# Patient Record
Sex: Female | Born: 1955
Health system: Southern US, Community
[De-identification: ages and names within clinical notes are randomized; demographics above are authoritative.]

## PROBLEM LIST (undated history)

## (undated) DIAGNOSIS — I739 Peripheral vascular disease, unspecified: Secondary | ICD-10-CM

## (undated) DIAGNOSIS — I10 Essential (primary) hypertension: Secondary | ICD-10-CM

## (undated) DIAGNOSIS — K922 Gastrointestinal hemorrhage, unspecified: Secondary | ICD-10-CM

## (undated) DIAGNOSIS — Z8619 Personal history of other infectious and parasitic diseases: Secondary | ICD-10-CM

## (undated) DIAGNOSIS — K5520 Angiodysplasia of colon without hemorrhage: Secondary | ICD-10-CM

## (undated) DIAGNOSIS — M869 Osteomyelitis, unspecified: Secondary | ICD-10-CM

## (undated) DIAGNOSIS — E669 Obesity, unspecified: Secondary | ICD-10-CM

## (undated) DIAGNOSIS — F32A Depression, unspecified: Secondary | ICD-10-CM

## (undated) DIAGNOSIS — M1990 Unspecified osteoarthritis, unspecified site: Secondary | ICD-10-CM

## (undated) DIAGNOSIS — D649 Anemia, unspecified: Secondary | ICD-10-CM

## (undated) DIAGNOSIS — I700 Atherosclerosis of aorta: Secondary | ICD-10-CM

## (undated) DIAGNOSIS — E119 Type 2 diabetes mellitus without complications: Secondary | ICD-10-CM

## (undated) DIAGNOSIS — K7290 Hepatic failure, unspecified without coma: Secondary | ICD-10-CM

## (undated) DIAGNOSIS — D696 Thrombocytopenia, unspecified: Secondary | ICD-10-CM

## (undated) DIAGNOSIS — F419 Anxiety disorder, unspecified: Secondary | ICD-10-CM

## (undated) DIAGNOSIS — K7460 Unspecified cirrhosis of liver: Secondary | ICD-10-CM

## (undated) DIAGNOSIS — K7682 Hepatic encephalopathy: Secondary | ICD-10-CM

## (undated) DIAGNOSIS — I2510 Atherosclerotic heart disease of native coronary artery without angina pectoris: Secondary | ICD-10-CM

## (undated) DIAGNOSIS — Z87442 Personal history of urinary calculi: Secondary | ICD-10-CM

## (undated) HISTORY — DX: Essential (primary) hypertension: I10

## (undated) HISTORY — DX: Hepatic encephalopathy: K76.82

## (undated) HISTORY — DX: Gastrointestinal hemorrhage, unspecified: K92.2

## (undated) HISTORY — PX: OTHER SURGICAL HISTORY: SHX169

## (undated) HISTORY — PX: TUBAL LIGATION: SHX77

## (undated) HISTORY — DX: Osteomyelitis, unspecified: M86.9

## (undated) HISTORY — PX: ESOPHAGOGASTRODUODENOSCOPY: SHX1529

## (undated) HISTORY — PX: COLONOSCOPY: SHX174

## (undated) HISTORY — DX: Type 2 diabetes mellitus without complications: E11.9

## (undated) HISTORY — DX: Hepatic failure, unspecified without coma: K72.90

## (undated) HISTORY — DX: Atherosclerotic heart disease of native coronary artery without angina pectoris: I25.10

## (undated) HISTORY — DX: Unspecified cirrhosis of liver: K74.60

## (undated) HISTORY — DX: Obesity, unspecified: E66.9

## (undated) HISTORY — DX: Anemia, unspecified: D64.9

## (undated) HISTORY — DX: Angiodysplasia of colon without hemorrhage: K55.20

## (undated) HISTORY — PX: ABDOMINAL HYSTERECTOMY: SHX81

## (undated) HISTORY — DX: Thrombocytopenia, unspecified: D69.6

## (undated) HISTORY — DX: Personal history of other infectious and parasitic diseases: Z86.19

## (undated) HISTORY — DX: Atherosclerosis of aorta: I70.0

---

## 2002-12-19 DIAGNOSIS — K7460 Unspecified cirrhosis of liver: Secondary | ICD-10-CM

## 2002-12-19 HISTORY — DX: Unspecified cirrhosis of liver: K74.60

## 2016-01-11 DIAGNOSIS — A5216 Charcot's arthropathy (tabetic): Secondary | ICD-10-CM | POA: Diagnosis not present

## 2016-01-11 DIAGNOSIS — M79672 Pain in left foot: Secondary | ICD-10-CM | POA: Diagnosis not present

## 2016-01-11 DIAGNOSIS — S92014A Nondisplaced fracture of body of right calcaneus, initial encounter for closed fracture: Secondary | ICD-10-CM | POA: Diagnosis not present

## 2016-01-11 DIAGNOSIS — M79671 Pain in right foot: Secondary | ICD-10-CM | POA: Diagnosis not present

## 2016-01-18 DIAGNOSIS — E1151 Type 2 diabetes mellitus with diabetic peripheral angiopathy without gangrene: Secondary | ICD-10-CM | POA: Diagnosis not present

## 2016-01-18 DIAGNOSIS — E1140 Type 2 diabetes mellitus with diabetic neuropathy, unspecified: Secondary | ICD-10-CM | POA: Diagnosis not present

## 2016-02-29 DIAGNOSIS — S92014D Nondisplaced fracture of body of right calcaneus, subsequent encounter for fracture with routine healing: Secondary | ICD-10-CM | POA: Diagnosis not present

## 2016-02-29 DIAGNOSIS — E1151 Type 2 diabetes mellitus with diabetic peripheral angiopathy without gangrene: Secondary | ICD-10-CM | POA: Diagnosis not present

## 2016-02-29 DIAGNOSIS — M79671 Pain in right foot: Secondary | ICD-10-CM | POA: Diagnosis not present

## 2016-02-29 DIAGNOSIS — M79672 Pain in left foot: Secondary | ICD-10-CM | POA: Diagnosis not present

## 2016-02-29 DIAGNOSIS — A5216 Charcot's arthropathy (tabetic): Secondary | ICD-10-CM | POA: Diagnosis not present

## 2016-02-29 DIAGNOSIS — E1140 Type 2 diabetes mellitus with diabetic neuropathy, unspecified: Secondary | ICD-10-CM | POA: Diagnosis not present

## 2016-03-09 DIAGNOSIS — Z7189 Other specified counseling: Secondary | ICD-10-CM | POA: Diagnosis not present

## 2016-03-09 DIAGNOSIS — R5383 Other fatigue: Secondary | ICD-10-CM | POA: Diagnosis not present

## 2016-03-09 DIAGNOSIS — Z299 Encounter for prophylactic measures, unspecified: Secondary | ICD-10-CM | POA: Diagnosis not present

## 2016-03-09 DIAGNOSIS — Z6833 Body mass index (BMI) 33.0-33.9, adult: Secondary | ICD-10-CM | POA: Diagnosis not present

## 2016-03-09 DIAGNOSIS — Z1211 Encounter for screening for malignant neoplasm of colon: Secondary | ICD-10-CM | POA: Diagnosis not present

## 2016-03-09 DIAGNOSIS — E1142 Type 2 diabetes mellitus with diabetic polyneuropathy: Secondary | ICD-10-CM | POA: Diagnosis not present

## 2016-03-09 DIAGNOSIS — E7800 Pure hypercholesterolemia, unspecified: Secondary | ICD-10-CM | POA: Diagnosis not present

## 2016-03-09 DIAGNOSIS — E559 Vitamin D deficiency, unspecified: Secondary | ICD-10-CM | POA: Diagnosis not present

## 2016-03-09 DIAGNOSIS — Z1389 Encounter for screening for other disorder: Secondary | ICD-10-CM | POA: Diagnosis not present

## 2016-03-09 DIAGNOSIS — Z0000 Encounter for general adult medical examination without abnormal findings: Secondary | ICD-10-CM | POA: Diagnosis not present

## 2016-04-04 DIAGNOSIS — M7662 Achilles tendinitis, left leg: Secondary | ICD-10-CM | POA: Diagnosis not present

## 2016-04-04 DIAGNOSIS — M79672 Pain in left foot: Secondary | ICD-10-CM | POA: Diagnosis not present

## 2016-04-14 DIAGNOSIS — E7800 Pure hypercholesterolemia, unspecified: Secondary | ICD-10-CM | POA: Diagnosis not present

## 2016-04-14 DIAGNOSIS — E2839 Other primary ovarian failure: Secondary | ICD-10-CM | POA: Diagnosis not present

## 2016-04-14 DIAGNOSIS — Z87891 Personal history of nicotine dependence: Secondary | ICD-10-CM | POA: Diagnosis not present

## 2016-04-14 DIAGNOSIS — R740 Nonspecific elevation of levels of transaminase and lactic acid dehydrogenase [LDH]: Secondary | ICD-10-CM | POA: Diagnosis not present

## 2016-04-14 DIAGNOSIS — E1142 Type 2 diabetes mellitus with diabetic polyneuropathy: Secondary | ICD-10-CM | POA: Diagnosis not present

## 2016-05-03 DIAGNOSIS — E1142 Type 2 diabetes mellitus with diabetic polyneuropathy: Secondary | ICD-10-CM | POA: Diagnosis not present

## 2016-05-03 DIAGNOSIS — I10 Essential (primary) hypertension: Secondary | ICD-10-CM | POA: Diagnosis not present

## 2016-05-03 DIAGNOSIS — L97921 Non-pressure chronic ulcer of unspecified part of left lower leg limited to breakdown of skin: Secondary | ICD-10-CM | POA: Diagnosis not present

## 2016-05-23 DIAGNOSIS — L89892 Pressure ulcer of other site, stage 2: Secondary | ICD-10-CM | POA: Diagnosis not present

## 2016-05-23 DIAGNOSIS — E1151 Type 2 diabetes mellitus with diabetic peripheral angiopathy without gangrene: Secondary | ICD-10-CM | POA: Diagnosis not present

## 2016-05-23 DIAGNOSIS — E1140 Type 2 diabetes mellitus with diabetic neuropathy, unspecified: Secondary | ICD-10-CM | POA: Diagnosis not present

## 2016-05-30 DIAGNOSIS — E1140 Type 2 diabetes mellitus with diabetic neuropathy, unspecified: Secondary | ICD-10-CM | POA: Diagnosis not present

## 2016-05-30 DIAGNOSIS — L89892 Pressure ulcer of other site, stage 2: Secondary | ICD-10-CM | POA: Diagnosis not present

## 2016-05-30 DIAGNOSIS — E1151 Type 2 diabetes mellitus with diabetic peripheral angiopathy without gangrene: Secondary | ICD-10-CM | POA: Diagnosis not present

## 2016-06-13 DIAGNOSIS — I10 Essential (primary) hypertension: Secondary | ICD-10-CM | POA: Diagnosis not present

## 2016-06-13 DIAGNOSIS — E119 Type 2 diabetes mellitus without complications: Secondary | ICD-10-CM | POA: Diagnosis not present

## 2016-06-13 DIAGNOSIS — E7800 Pure hypercholesterolemia, unspecified: Secondary | ICD-10-CM | POA: Diagnosis not present

## 2016-07-19 DIAGNOSIS — M7531 Calcific tendinitis of right shoulder: Secondary | ICD-10-CM | POA: Diagnosis not present

## 2016-07-19 DIAGNOSIS — Z79899 Other long term (current) drug therapy: Secondary | ICD-10-CM | POA: Diagnosis not present

## 2016-07-19 DIAGNOSIS — M7989 Other specified soft tissue disorders: Secondary | ICD-10-CM | POA: Diagnosis not present

## 2016-07-19 DIAGNOSIS — L0889 Other specified local infections of the skin and subcutaneous tissue: Secondary | ICD-10-CM | POA: Diagnosis not present

## 2016-07-19 DIAGNOSIS — L97511 Non-pressure chronic ulcer of other part of right foot limited to breakdown of skin: Secondary | ICD-10-CM | POA: Diagnosis not present

## 2016-07-19 DIAGNOSIS — I10 Essential (primary) hypertension: Secondary | ICD-10-CM | POA: Diagnosis not present

## 2016-07-19 DIAGNOSIS — L089 Local infection of the skin and subcutaneous tissue, unspecified: Secondary | ICD-10-CM | POA: Diagnosis not present

## 2016-07-19 DIAGNOSIS — Z794 Long term (current) use of insulin: Secondary | ICD-10-CM | POA: Diagnosis not present

## 2016-07-19 DIAGNOSIS — Z7984 Long term (current) use of oral hypoglycemic drugs: Secondary | ICD-10-CM | POA: Diagnosis not present

## 2016-07-19 DIAGNOSIS — F329 Major depressive disorder, single episode, unspecified: Secondary | ICD-10-CM | POA: Diagnosis not present

## 2016-07-19 DIAGNOSIS — E11621 Type 2 diabetes mellitus with foot ulcer: Secondary | ICD-10-CM | POA: Diagnosis not present

## 2016-07-19 DIAGNOSIS — M25511 Pain in right shoulder: Secondary | ICD-10-CM | POA: Diagnosis not present

## 2016-07-26 DIAGNOSIS — I10 Essential (primary) hypertension: Secondary | ICD-10-CM | POA: Diagnosis not present

## 2016-07-26 DIAGNOSIS — E119 Type 2 diabetes mellitus without complications: Secondary | ICD-10-CM | POA: Diagnosis not present

## 2016-07-26 DIAGNOSIS — E7800 Pure hypercholesterolemia, unspecified: Secondary | ICD-10-CM | POA: Diagnosis not present

## 2016-08-19 DIAGNOSIS — R42 Dizziness and giddiness: Secondary | ICD-10-CM | POA: Diagnosis not present

## 2016-08-19 DIAGNOSIS — L97519 Non-pressure chronic ulcer of other part of right foot with unspecified severity: Secondary | ICD-10-CM | POA: Diagnosis not present

## 2016-08-19 DIAGNOSIS — E1142 Type 2 diabetes mellitus with diabetic polyneuropathy: Secondary | ICD-10-CM | POA: Diagnosis not present

## 2016-08-19 DIAGNOSIS — J209 Acute bronchitis, unspecified: Secondary | ICD-10-CM | POA: Diagnosis not present

## 2016-08-29 DIAGNOSIS — L97901 Non-pressure chronic ulcer of unspecified part of unspecified lower leg limited to breakdown of skin: Secondary | ICD-10-CM | POA: Diagnosis not present

## 2016-08-29 DIAGNOSIS — L97911 Non-pressure chronic ulcer of unspecified part of right lower leg limited to breakdown of skin: Secondary | ICD-10-CM | POA: Diagnosis not present

## 2016-09-08 DIAGNOSIS — E119 Type 2 diabetes mellitus without complications: Secondary | ICD-10-CM | POA: Diagnosis not present

## 2016-09-08 DIAGNOSIS — H2513 Age-related nuclear cataract, bilateral: Secondary | ICD-10-CM | POA: Diagnosis not present

## 2016-09-08 DIAGNOSIS — H5200 Hypermetropia, unspecified eye: Secondary | ICD-10-CM | POA: Diagnosis not present

## 2016-09-08 DIAGNOSIS — H1045 Other chronic allergic conjunctivitis: Secondary | ICD-10-CM | POA: Diagnosis not present

## 2016-09-09 DIAGNOSIS — I739 Peripheral vascular disease, unspecified: Secondary | ICD-10-CM | POA: Diagnosis not present

## 2016-09-09 DIAGNOSIS — Z6831 Body mass index (BMI) 31.0-31.9, adult: Secondary | ICD-10-CM | POA: Diagnosis not present

## 2016-09-09 DIAGNOSIS — L97921 Non-pressure chronic ulcer of unspecified part of left lower leg limited to breakdown of skin: Secondary | ICD-10-CM | POA: Diagnosis not present

## 2016-09-09 DIAGNOSIS — M549 Dorsalgia, unspecified: Secondary | ICD-10-CM | POA: Diagnosis not present

## 2016-09-13 DIAGNOSIS — E7800 Pure hypercholesterolemia, unspecified: Secondary | ICD-10-CM | POA: Diagnosis not present

## 2016-09-13 DIAGNOSIS — I10 Essential (primary) hypertension: Secondary | ICD-10-CM | POA: Diagnosis not present

## 2016-09-13 DIAGNOSIS — E119 Type 2 diabetes mellitus without complications: Secondary | ICD-10-CM | POA: Diagnosis not present

## 2016-09-19 DIAGNOSIS — E1151 Type 2 diabetes mellitus with diabetic peripheral angiopathy without gangrene: Secondary | ICD-10-CM | POA: Diagnosis not present

## 2016-09-19 DIAGNOSIS — E1140 Type 2 diabetes mellitus with diabetic neuropathy, unspecified: Secondary | ICD-10-CM | POA: Diagnosis not present

## 2016-09-19 DIAGNOSIS — L89892 Pressure ulcer of other site, stage 2: Secondary | ICD-10-CM | POA: Diagnosis not present

## 2016-10-03 DIAGNOSIS — L89892 Pressure ulcer of other site, stage 2: Secondary | ICD-10-CM | POA: Diagnosis not present

## 2016-10-03 DIAGNOSIS — M79671 Pain in right foot: Secondary | ICD-10-CM | POA: Diagnosis not present

## 2016-10-03 DIAGNOSIS — E1140 Type 2 diabetes mellitus with diabetic neuropathy, unspecified: Secondary | ICD-10-CM | POA: Diagnosis not present

## 2016-10-03 DIAGNOSIS — E1151 Type 2 diabetes mellitus with diabetic peripheral angiopathy without gangrene: Secondary | ICD-10-CM | POA: Diagnosis not present

## 2016-10-17 DIAGNOSIS — E7800 Pure hypercholesterolemia, unspecified: Secondary | ICD-10-CM | POA: Diagnosis not present

## 2016-10-17 DIAGNOSIS — E119 Type 2 diabetes mellitus without complications: Secondary | ICD-10-CM | POA: Diagnosis not present

## 2016-10-17 DIAGNOSIS — I10 Essential (primary) hypertension: Secondary | ICD-10-CM | POA: Diagnosis not present

## 2016-10-18 DIAGNOSIS — E1140 Type 2 diabetes mellitus with diabetic neuropathy, unspecified: Secondary | ICD-10-CM | POA: Diagnosis not present

## 2016-10-18 DIAGNOSIS — E1151 Type 2 diabetes mellitus with diabetic peripheral angiopathy without gangrene: Secondary | ICD-10-CM | POA: Diagnosis not present

## 2016-10-19 DIAGNOSIS — Z299 Encounter for prophylactic measures, unspecified: Secondary | ICD-10-CM | POA: Diagnosis not present

## 2016-10-19 DIAGNOSIS — I10 Essential (primary) hypertension: Secondary | ICD-10-CM | POA: Diagnosis not present

## 2016-10-19 DIAGNOSIS — F329 Major depressive disorder, single episode, unspecified: Secondary | ICD-10-CM | POA: Diagnosis not present

## 2016-10-19 DIAGNOSIS — Z1389 Encounter for screening for other disorder: Secondary | ICD-10-CM | POA: Diagnosis not present

## 2016-10-19 DIAGNOSIS — M7551 Bursitis of right shoulder: Secondary | ICD-10-CM | POA: Diagnosis not present

## 2016-10-19 DIAGNOSIS — E1142 Type 2 diabetes mellitus with diabetic polyneuropathy: Secondary | ICD-10-CM | POA: Diagnosis not present

## 2016-10-24 DIAGNOSIS — Z23 Encounter for immunization: Secondary | ICD-10-CM | POA: Diagnosis not present

## 2016-11-08 DIAGNOSIS — E1140 Type 2 diabetes mellitus with diabetic neuropathy, unspecified: Secondary | ICD-10-CM | POA: Diagnosis not present

## 2016-11-08 DIAGNOSIS — E1151 Type 2 diabetes mellitus with diabetic peripheral angiopathy without gangrene: Secondary | ICD-10-CM | POA: Diagnosis not present

## 2016-12-06 DIAGNOSIS — L89892 Pressure ulcer of other site, stage 2: Secondary | ICD-10-CM | POA: Diagnosis not present

## 2016-12-06 DIAGNOSIS — E1142 Type 2 diabetes mellitus with diabetic polyneuropathy: Secondary | ICD-10-CM | POA: Diagnosis not present

## 2016-12-06 DIAGNOSIS — M79609 Pain in unspecified limb: Secondary | ICD-10-CM | POA: Diagnosis not present

## 2016-12-06 DIAGNOSIS — Z299 Encounter for prophylactic measures, unspecified: Secondary | ICD-10-CM | POA: Diagnosis not present

## 2016-12-06 DIAGNOSIS — J069 Acute upper respiratory infection, unspecified: Secondary | ICD-10-CM | POA: Diagnosis not present

## 2016-12-06 DIAGNOSIS — E1140 Type 2 diabetes mellitus with diabetic neuropathy, unspecified: Secondary | ICD-10-CM | POA: Diagnosis not present

## 2016-12-06 DIAGNOSIS — M19019 Primary osteoarthritis, unspecified shoulder: Secondary | ICD-10-CM | POA: Diagnosis not present

## 2016-12-06 DIAGNOSIS — Z6832 Body mass index (BMI) 32.0-32.9, adult: Secondary | ICD-10-CM | POA: Diagnosis not present

## 2016-12-06 DIAGNOSIS — E1151 Type 2 diabetes mellitus with diabetic peripheral angiopathy without gangrene: Secondary | ICD-10-CM | POA: Diagnosis not present

## 2016-12-06 DIAGNOSIS — Z87891 Personal history of nicotine dependence: Secondary | ICD-10-CM | POA: Diagnosis not present

## 2016-12-15 DIAGNOSIS — E119 Type 2 diabetes mellitus without complications: Secondary | ICD-10-CM | POA: Diagnosis not present

## 2016-12-15 DIAGNOSIS — I10 Essential (primary) hypertension: Secondary | ICD-10-CM | POA: Diagnosis not present

## 2016-12-15 DIAGNOSIS — E7800 Pure hypercholesterolemia, unspecified: Secondary | ICD-10-CM | POA: Diagnosis not present

## 2017-01-02 DIAGNOSIS — L89892 Pressure ulcer of other site, stage 2: Secondary | ICD-10-CM | POA: Diagnosis not present

## 2017-01-02 DIAGNOSIS — E1140 Type 2 diabetes mellitus with diabetic neuropathy, unspecified: Secondary | ICD-10-CM | POA: Diagnosis not present

## 2017-01-02 DIAGNOSIS — E1151 Type 2 diabetes mellitus with diabetic peripheral angiopathy without gangrene: Secondary | ICD-10-CM | POA: Diagnosis not present

## 2017-01-17 DIAGNOSIS — Z6832 Body mass index (BMI) 32.0-32.9, adult: Secondary | ICD-10-CM | POA: Diagnosis not present

## 2017-01-17 DIAGNOSIS — E1142 Type 2 diabetes mellitus with diabetic polyneuropathy: Secondary | ICD-10-CM | POA: Diagnosis not present

## 2017-01-17 DIAGNOSIS — R0789 Other chest pain: Secondary | ICD-10-CM | POA: Diagnosis not present

## 2017-01-17 DIAGNOSIS — Z87891 Personal history of nicotine dependence: Secondary | ICD-10-CM | POA: Diagnosis not present

## 2017-01-17 DIAGNOSIS — R350 Frequency of micturition: Secondary | ICD-10-CM | POA: Diagnosis not present

## 2017-01-17 DIAGNOSIS — Z713 Dietary counseling and surveillance: Secondary | ICD-10-CM | POA: Diagnosis not present

## 2017-01-17 DIAGNOSIS — Z299 Encounter for prophylactic measures, unspecified: Secondary | ICD-10-CM | POA: Diagnosis not present

## 2017-01-30 DIAGNOSIS — E1140 Type 2 diabetes mellitus with diabetic neuropathy, unspecified: Secondary | ICD-10-CM | POA: Diagnosis not present

## 2017-01-30 DIAGNOSIS — E1151 Type 2 diabetes mellitus with diabetic peripheral angiopathy without gangrene: Secondary | ICD-10-CM | POA: Diagnosis not present

## 2017-01-30 DIAGNOSIS — L89892 Pressure ulcer of other site, stage 2: Secondary | ICD-10-CM | POA: Diagnosis not present

## 2017-02-09 DIAGNOSIS — Z794 Long term (current) use of insulin: Secondary | ICD-10-CM | POA: Diagnosis not present

## 2017-02-09 DIAGNOSIS — E119 Type 2 diabetes mellitus without complications: Secondary | ICD-10-CM | POA: Diagnosis not present

## 2017-02-09 DIAGNOSIS — M25532 Pain in left wrist: Secondary | ICD-10-CM | POA: Diagnosis not present

## 2017-02-09 DIAGNOSIS — G5602 Carpal tunnel syndrome, left upper limb: Secondary | ICD-10-CM | POA: Diagnosis not present

## 2017-02-09 DIAGNOSIS — Z87891 Personal history of nicotine dependence: Secondary | ICD-10-CM | POA: Diagnosis not present

## 2017-02-09 DIAGNOSIS — M7989 Other specified soft tissue disorders: Secondary | ICD-10-CM | POA: Diagnosis not present

## 2017-02-09 DIAGNOSIS — M1990 Unspecified osteoarthritis, unspecified site: Secondary | ICD-10-CM | POA: Diagnosis not present

## 2017-02-09 DIAGNOSIS — Z79899 Other long term (current) drug therapy: Secondary | ICD-10-CM | POA: Diagnosis not present

## 2017-02-09 DIAGNOSIS — Z8249 Family history of ischemic heart disease and other diseases of the circulatory system: Secondary | ICD-10-CM | POA: Diagnosis not present

## 2017-02-09 DIAGNOSIS — F329 Major depressive disorder, single episode, unspecified: Secondary | ICD-10-CM | POA: Diagnosis not present

## 2017-02-09 DIAGNOSIS — I10 Essential (primary) hypertension: Secondary | ICD-10-CM | POA: Diagnosis not present

## 2017-02-09 DIAGNOSIS — S6992XA Unspecified injury of left wrist, hand and finger(s), initial encounter: Secondary | ICD-10-CM | POA: Diagnosis not present

## 2017-02-10 DIAGNOSIS — I10 Essential (primary) hypertension: Secondary | ICD-10-CM | POA: Diagnosis not present

## 2017-02-10 DIAGNOSIS — E7800 Pure hypercholesterolemia, unspecified: Secondary | ICD-10-CM | POA: Diagnosis not present

## 2017-02-10 DIAGNOSIS — E119 Type 2 diabetes mellitus without complications: Secondary | ICD-10-CM | POA: Diagnosis not present

## 2017-03-13 DIAGNOSIS — E1140 Type 2 diabetes mellitus with diabetic neuropathy, unspecified: Secondary | ICD-10-CM | POA: Diagnosis not present

## 2017-03-13 DIAGNOSIS — L89892 Pressure ulcer of other site, stage 2: Secondary | ICD-10-CM | POA: Diagnosis not present

## 2017-03-13 DIAGNOSIS — E1151 Type 2 diabetes mellitus with diabetic peripheral angiopathy without gangrene: Secondary | ICD-10-CM | POA: Diagnosis not present

## 2017-03-14 DIAGNOSIS — Z299 Encounter for prophylactic measures, unspecified: Secondary | ICD-10-CM | POA: Diagnosis not present

## 2017-03-14 DIAGNOSIS — E7800 Pure hypercholesterolemia, unspecified: Secondary | ICD-10-CM | POA: Diagnosis not present

## 2017-03-14 DIAGNOSIS — Z79899 Other long term (current) drug therapy: Secondary | ICD-10-CM | POA: Diagnosis not present

## 2017-03-14 DIAGNOSIS — L97921 Non-pressure chronic ulcer of unspecified part of left lower leg limited to breakdown of skin: Secondary | ICD-10-CM | POA: Diagnosis not present

## 2017-03-14 DIAGNOSIS — Z1211 Encounter for screening for malignant neoplasm of colon: Secondary | ICD-10-CM | POA: Diagnosis not present

## 2017-03-14 DIAGNOSIS — E1142 Type 2 diabetes mellitus with diabetic polyneuropathy: Secondary | ICD-10-CM | POA: Diagnosis not present

## 2017-03-14 DIAGNOSIS — K769 Liver disease, unspecified: Secondary | ICD-10-CM | POA: Diagnosis not present

## 2017-03-14 DIAGNOSIS — R5383 Other fatigue: Secondary | ICD-10-CM | POA: Diagnosis not present

## 2017-03-14 DIAGNOSIS — Z0000 Encounter for general adult medical examination without abnormal findings: Secondary | ICD-10-CM | POA: Diagnosis not present

## 2017-03-14 DIAGNOSIS — K7460 Unspecified cirrhosis of liver: Secondary | ICD-10-CM | POA: Diagnosis not present

## 2017-03-14 DIAGNOSIS — E559 Vitamin D deficiency, unspecified: Secondary | ICD-10-CM | POA: Diagnosis not present

## 2017-03-14 DIAGNOSIS — I739 Peripheral vascular disease, unspecified: Secondary | ICD-10-CM | POA: Diagnosis not present

## 2017-03-14 DIAGNOSIS — Z7189 Other specified counseling: Secondary | ICD-10-CM | POA: Diagnosis not present

## 2017-03-14 DIAGNOSIS — Z6833 Body mass index (BMI) 33.0-33.9, adult: Secondary | ICD-10-CM | POA: Diagnosis not present

## 2017-03-14 DIAGNOSIS — Z1389 Encounter for screening for other disorder: Secondary | ICD-10-CM | POA: Diagnosis not present

## 2017-03-29 DIAGNOSIS — M25519 Pain in unspecified shoulder: Secondary | ICD-10-CM | POA: Diagnosis not present

## 2017-03-29 DIAGNOSIS — Z1389 Encounter for screening for other disorder: Secondary | ICD-10-CM | POA: Diagnosis not present

## 2017-03-29 DIAGNOSIS — E1149 Type 2 diabetes mellitus with other diabetic neurological complication: Secondary | ICD-10-CM | POA: Diagnosis not present

## 2017-03-29 DIAGNOSIS — Z6833 Body mass index (BMI) 33.0-33.9, adult: Secondary | ICD-10-CM | POA: Diagnosis not present

## 2017-03-29 DIAGNOSIS — Z299 Encounter for prophylactic measures, unspecified: Secondary | ICD-10-CM | POA: Diagnosis not present

## 2017-03-29 DIAGNOSIS — I10 Essential (primary) hypertension: Secondary | ICD-10-CM | POA: Diagnosis not present

## 2017-03-29 DIAGNOSIS — F329 Major depressive disorder, single episode, unspecified: Secondary | ICD-10-CM | POA: Diagnosis not present

## 2017-03-29 DIAGNOSIS — E1165 Type 2 diabetes mellitus with hyperglycemia: Secondary | ICD-10-CM | POA: Diagnosis not present

## 2017-04-03 DIAGNOSIS — E1151 Type 2 diabetes mellitus with diabetic peripheral angiopathy without gangrene: Secondary | ICD-10-CM | POA: Diagnosis not present

## 2017-04-03 DIAGNOSIS — L89892 Pressure ulcer of other site, stage 2: Secondary | ICD-10-CM | POA: Diagnosis not present

## 2017-04-03 DIAGNOSIS — E1140 Type 2 diabetes mellitus with diabetic neuropathy, unspecified: Secondary | ICD-10-CM | POA: Diagnosis not present

## 2017-04-06 DIAGNOSIS — E119 Type 2 diabetes mellitus without complications: Secondary | ICD-10-CM | POA: Diagnosis not present

## 2017-04-06 DIAGNOSIS — I10 Essential (primary) hypertension: Secondary | ICD-10-CM | POA: Diagnosis not present

## 2017-04-06 DIAGNOSIS — E7800 Pure hypercholesterolemia, unspecified: Secondary | ICD-10-CM | POA: Diagnosis not present

## 2017-04-10 DIAGNOSIS — M797 Fibromyalgia: Secondary | ICD-10-CM | POA: Diagnosis not present

## 2017-04-10 DIAGNOSIS — M2560 Stiffness of unspecified joint, not elsewhere classified: Secondary | ICD-10-CM | POA: Diagnosis not present

## 2017-04-10 DIAGNOSIS — M25512 Pain in left shoulder: Secondary | ICD-10-CM | POA: Diagnosis not present

## 2017-04-10 DIAGNOSIS — M19019 Primary osteoarthritis, unspecified shoulder: Secondary | ICD-10-CM | POA: Diagnosis not present

## 2017-04-10 DIAGNOSIS — M62838 Other muscle spasm: Secondary | ICD-10-CM | POA: Diagnosis not present

## 2017-04-10 DIAGNOSIS — M25511 Pain in right shoulder: Secondary | ICD-10-CM | POA: Diagnosis not present

## 2017-04-13 DIAGNOSIS — M797 Fibromyalgia: Secondary | ICD-10-CM | POA: Diagnosis not present

## 2017-04-13 DIAGNOSIS — M19019 Primary osteoarthritis, unspecified shoulder: Secondary | ICD-10-CM | POA: Diagnosis not present

## 2017-04-13 DIAGNOSIS — M2560 Stiffness of unspecified joint, not elsewhere classified: Secondary | ICD-10-CM | POA: Diagnosis not present

## 2017-04-13 DIAGNOSIS — M25512 Pain in left shoulder: Secondary | ICD-10-CM | POA: Diagnosis not present

## 2017-04-13 DIAGNOSIS — M25511 Pain in right shoulder: Secondary | ICD-10-CM | POA: Diagnosis not present

## 2017-04-13 DIAGNOSIS — M62838 Other muscle spasm: Secondary | ICD-10-CM | POA: Diagnosis not present

## 2017-04-17 DIAGNOSIS — M25511 Pain in right shoulder: Secondary | ICD-10-CM | POA: Diagnosis not present

## 2017-04-17 DIAGNOSIS — M25512 Pain in left shoulder: Secondary | ICD-10-CM | POA: Diagnosis not present

## 2017-04-17 DIAGNOSIS — M2560 Stiffness of unspecified joint, not elsewhere classified: Secondary | ICD-10-CM | POA: Diagnosis not present

## 2017-04-17 DIAGNOSIS — M19019 Primary osteoarthritis, unspecified shoulder: Secondary | ICD-10-CM | POA: Diagnosis not present

## 2017-04-17 DIAGNOSIS — M797 Fibromyalgia: Secondary | ICD-10-CM | POA: Diagnosis not present

## 2017-04-17 DIAGNOSIS — M62838 Other muscle spasm: Secondary | ICD-10-CM | POA: Diagnosis not present

## 2017-04-24 DIAGNOSIS — Z79899 Other long term (current) drug therapy: Secondary | ICD-10-CM | POA: Diagnosis not present

## 2017-04-24 DIAGNOSIS — E119 Type 2 diabetes mellitus without complications: Secondary | ICD-10-CM | POA: Diagnosis not present

## 2017-04-24 DIAGNOSIS — I10 Essential (primary) hypertension: Secondary | ICD-10-CM | POA: Diagnosis not present

## 2017-04-24 DIAGNOSIS — M86071 Acute hematogenous osteomyelitis, right ankle and foot: Secondary | ICD-10-CM | POA: Diagnosis not present

## 2017-04-24 DIAGNOSIS — F329 Major depressive disorder, single episode, unspecified: Secondary | ICD-10-CM | POA: Diagnosis not present

## 2017-04-24 DIAGNOSIS — Z794 Long term (current) use of insulin: Secondary | ICD-10-CM | POA: Diagnosis not present

## 2017-04-24 DIAGNOSIS — L03031 Cellulitis of right toe: Secondary | ICD-10-CM | POA: Diagnosis not present

## 2017-04-24 DIAGNOSIS — M79671 Pain in right foot: Secondary | ICD-10-CM | POA: Diagnosis not present

## 2017-04-24 DIAGNOSIS — M1990 Unspecified osteoarthritis, unspecified site: Secondary | ICD-10-CM | POA: Diagnosis not present

## 2017-04-24 DIAGNOSIS — M7989 Other specified soft tissue disorders: Secondary | ICD-10-CM | POA: Diagnosis not present

## 2017-04-26 DIAGNOSIS — E1140 Type 2 diabetes mellitus with diabetic neuropathy, unspecified: Secondary | ICD-10-CM | POA: Diagnosis not present

## 2017-04-26 DIAGNOSIS — E1151 Type 2 diabetes mellitus with diabetic peripheral angiopathy without gangrene: Secondary | ICD-10-CM | POA: Diagnosis not present

## 2017-04-26 DIAGNOSIS — L89892 Pressure ulcer of other site, stage 2: Secondary | ICD-10-CM | POA: Diagnosis not present

## 2017-05-01 DIAGNOSIS — E1140 Type 2 diabetes mellitus with diabetic neuropathy, unspecified: Secondary | ICD-10-CM | POA: Diagnosis not present

## 2017-05-01 DIAGNOSIS — L89892 Pressure ulcer of other site, stage 2: Secondary | ICD-10-CM | POA: Diagnosis not present

## 2017-05-01 DIAGNOSIS — E1151 Type 2 diabetes mellitus with diabetic peripheral angiopathy without gangrene: Secondary | ICD-10-CM | POA: Diagnosis not present

## 2017-05-02 DIAGNOSIS — L97519 Non-pressure chronic ulcer of other part of right foot with unspecified severity: Secondary | ICD-10-CM | POA: Diagnosis not present

## 2017-05-02 DIAGNOSIS — L03115 Cellulitis of right lower limb: Secondary | ICD-10-CM | POA: Diagnosis not present

## 2017-05-02 DIAGNOSIS — E11621 Type 2 diabetes mellitus with foot ulcer: Secondary | ICD-10-CM | POA: Diagnosis not present

## 2017-05-08 DIAGNOSIS — E1151 Type 2 diabetes mellitus with diabetic peripheral angiopathy without gangrene: Secondary | ICD-10-CM | POA: Diagnosis not present

## 2017-05-08 DIAGNOSIS — E1140 Type 2 diabetes mellitus with diabetic neuropathy, unspecified: Secondary | ICD-10-CM | POA: Diagnosis not present

## 2017-05-08 DIAGNOSIS — L89892 Pressure ulcer of other site, stage 2: Secondary | ICD-10-CM | POA: Diagnosis not present

## 2017-05-19 DIAGNOSIS — I10 Essential (primary) hypertension: Secondary | ICD-10-CM | POA: Diagnosis not present

## 2017-05-19 DIAGNOSIS — E119 Type 2 diabetes mellitus without complications: Secondary | ICD-10-CM | POA: Diagnosis not present

## 2017-05-19 DIAGNOSIS — E7800 Pure hypercholesterolemia, unspecified: Secondary | ICD-10-CM | POA: Diagnosis not present

## 2017-05-22 DIAGNOSIS — L89892 Pressure ulcer of other site, stage 2: Secondary | ICD-10-CM | POA: Diagnosis not present

## 2017-05-22 DIAGNOSIS — E1151 Type 2 diabetes mellitus with diabetic peripheral angiopathy without gangrene: Secondary | ICD-10-CM | POA: Diagnosis not present

## 2017-05-22 DIAGNOSIS — M86171 Other acute osteomyelitis, right ankle and foot: Secondary | ICD-10-CM | POA: Diagnosis not present

## 2017-05-22 DIAGNOSIS — E1140 Type 2 diabetes mellitus with diabetic neuropathy, unspecified: Secondary | ICD-10-CM | POA: Diagnosis not present

## 2017-05-23 DIAGNOSIS — Z87442 Personal history of urinary calculi: Secondary | ICD-10-CM | POA: Diagnosis not present

## 2017-05-23 DIAGNOSIS — F329 Major depressive disorder, single episode, unspecified: Secondary | ICD-10-CM | POA: Diagnosis not present

## 2017-05-23 DIAGNOSIS — Z90710 Acquired absence of both cervix and uterus: Secondary | ICD-10-CM | POA: Diagnosis not present

## 2017-05-23 DIAGNOSIS — Z01818 Encounter for other preprocedural examination: Secondary | ICD-10-CM | POA: Diagnosis not present

## 2017-05-23 DIAGNOSIS — I10 Essential (primary) hypertension: Secondary | ICD-10-CM | POA: Diagnosis not present

## 2017-05-23 DIAGNOSIS — Z8249 Family history of ischemic heart disease and other diseases of the circulatory system: Secondary | ICD-10-CM | POA: Diagnosis not present

## 2017-05-23 DIAGNOSIS — E1140 Type 2 diabetes mellitus with diabetic neuropathy, unspecified: Secondary | ICD-10-CM | POA: Diagnosis not present

## 2017-05-23 DIAGNOSIS — K7460 Unspecified cirrhosis of liver: Secondary | ICD-10-CM | POA: Diagnosis not present

## 2017-05-23 DIAGNOSIS — Z794 Long term (current) use of insulin: Secondary | ICD-10-CM | POA: Diagnosis not present

## 2017-05-23 DIAGNOSIS — M1990 Unspecified osteoarthritis, unspecified site: Secondary | ICD-10-CM | POA: Diagnosis not present

## 2017-05-23 DIAGNOSIS — M79671 Pain in right foot: Secondary | ICD-10-CM | POA: Diagnosis not present

## 2017-05-23 DIAGNOSIS — Z841 Family history of disorders of kidney and ureter: Secondary | ICD-10-CM | POA: Diagnosis not present

## 2017-05-23 DIAGNOSIS — E7800 Pure hypercholesterolemia, unspecified: Secondary | ICD-10-CM | POA: Diagnosis not present

## 2017-05-23 DIAGNOSIS — E11621 Type 2 diabetes mellitus with foot ulcer: Secondary | ICD-10-CM | POA: Diagnosis not present

## 2017-05-23 DIAGNOSIS — M869 Osteomyelitis, unspecified: Secondary | ICD-10-CM | POA: Diagnosis not present

## 2017-05-23 DIAGNOSIS — Z79899 Other long term (current) drug therapy: Secondary | ICD-10-CM | POA: Diagnosis not present

## 2017-05-23 DIAGNOSIS — J302 Other seasonal allergic rhinitis: Secondary | ICD-10-CM | POA: Diagnosis not present

## 2017-05-23 DIAGNOSIS — Z9851 Tubal ligation status: Secondary | ICD-10-CM | POA: Diagnosis not present

## 2017-05-23 DIAGNOSIS — E1169 Type 2 diabetes mellitus with other specified complication: Secondary | ICD-10-CM | POA: Diagnosis not present

## 2017-05-23 DIAGNOSIS — Z7982 Long term (current) use of aspirin: Secondary | ICD-10-CM | POA: Diagnosis not present

## 2017-05-23 DIAGNOSIS — Z833 Family history of diabetes mellitus: Secondary | ICD-10-CM | POA: Diagnosis not present

## 2017-05-23 DIAGNOSIS — Z9049 Acquired absence of other specified parts of digestive tract: Secondary | ICD-10-CM | POA: Diagnosis not present

## 2017-05-23 DIAGNOSIS — L97519 Non-pressure chronic ulcer of other part of right foot with unspecified severity: Secondary | ICD-10-CM | POA: Diagnosis not present

## 2017-05-24 DIAGNOSIS — J302 Other seasonal allergic rhinitis: Secondary | ICD-10-CM | POA: Diagnosis not present

## 2017-05-24 DIAGNOSIS — E11621 Type 2 diabetes mellitus with foot ulcer: Secondary | ICD-10-CM | POA: Diagnosis not present

## 2017-05-24 DIAGNOSIS — M868X7 Other osteomyelitis, ankle and foot: Secondary | ICD-10-CM | POA: Diagnosis not present

## 2017-05-24 DIAGNOSIS — E1169 Type 2 diabetes mellitus with other specified complication: Secondary | ICD-10-CM | POA: Diagnosis not present

## 2017-05-24 DIAGNOSIS — M86171 Other acute osteomyelitis, right ankle and foot: Secondary | ICD-10-CM | POA: Diagnosis not present

## 2017-05-24 DIAGNOSIS — L97519 Non-pressure chronic ulcer of other part of right foot with unspecified severity: Secondary | ICD-10-CM | POA: Diagnosis not present

## 2017-05-24 DIAGNOSIS — E1140 Type 2 diabetes mellitus with diabetic neuropathy, unspecified: Secondary | ICD-10-CM | POA: Diagnosis not present

## 2017-05-24 DIAGNOSIS — E1151 Type 2 diabetes mellitus with diabetic peripheral angiopathy without gangrene: Secondary | ICD-10-CM | POA: Diagnosis not present

## 2017-05-24 DIAGNOSIS — M869 Osteomyelitis, unspecified: Secondary | ICD-10-CM | POA: Diagnosis not present

## 2017-05-24 DIAGNOSIS — Z79899 Other long term (current) drug therapy: Secondary | ICD-10-CM | POA: Diagnosis not present

## 2017-05-24 DIAGNOSIS — L89892 Pressure ulcer of other site, stage 2: Secondary | ICD-10-CM | POA: Diagnosis not present

## 2017-06-14 DIAGNOSIS — R195 Other fecal abnormalities: Secondary | ICD-10-CM | POA: Diagnosis not present

## 2017-07-04 DIAGNOSIS — E7800 Pure hypercholesterolemia, unspecified: Secondary | ICD-10-CM | POA: Diagnosis not present

## 2017-07-04 DIAGNOSIS — E119 Type 2 diabetes mellitus without complications: Secondary | ICD-10-CM | POA: Diagnosis not present

## 2017-07-04 DIAGNOSIS — I10 Essential (primary) hypertension: Secondary | ICD-10-CM | POA: Diagnosis not present

## 2017-07-06 DIAGNOSIS — I10 Essential (primary) hypertension: Secondary | ICD-10-CM | POA: Diagnosis not present

## 2017-07-06 DIAGNOSIS — K644 Residual hemorrhoidal skin tags: Secondary | ICD-10-CM | POA: Diagnosis not present

## 2017-07-06 DIAGNOSIS — E1140 Type 2 diabetes mellitus with diabetic neuropathy, unspecified: Secondary | ICD-10-CM | POA: Diagnosis not present

## 2017-07-06 DIAGNOSIS — M1990 Unspecified osteoarthritis, unspecified site: Secondary | ICD-10-CM | POA: Diagnosis not present

## 2017-07-06 DIAGNOSIS — K7460 Unspecified cirrhosis of liver: Secondary | ICD-10-CM | POA: Diagnosis not present

## 2017-07-06 DIAGNOSIS — Z823 Family history of stroke: Secondary | ICD-10-CM | POA: Diagnosis not present

## 2017-07-06 DIAGNOSIS — Z794 Long term (current) use of insulin: Secondary | ICD-10-CM | POA: Diagnosis not present

## 2017-07-06 DIAGNOSIS — Z87442 Personal history of urinary calculi: Secondary | ICD-10-CM | POA: Diagnosis not present

## 2017-07-06 DIAGNOSIS — Z79899 Other long term (current) drug therapy: Secondary | ICD-10-CM | POA: Diagnosis not present

## 2017-07-06 DIAGNOSIS — Z6833 Body mass index (BMI) 33.0-33.9, adult: Secondary | ICD-10-CM | POA: Diagnosis not present

## 2017-07-06 DIAGNOSIS — Q438 Other specified congenital malformations of intestine: Secondary | ICD-10-CM | POA: Diagnosis not present

## 2017-07-06 DIAGNOSIS — Z809 Family history of malignant neoplasm, unspecified: Secondary | ICD-10-CM | POA: Diagnosis not present

## 2017-07-06 DIAGNOSIS — E669 Obesity, unspecified: Secondary | ICD-10-CM | POA: Diagnosis not present

## 2017-07-06 DIAGNOSIS — R195 Other fecal abnormalities: Secondary | ICD-10-CM | POA: Diagnosis not present

## 2017-07-06 DIAGNOSIS — Z87891 Personal history of nicotine dependence: Secondary | ICD-10-CM | POA: Diagnosis not present

## 2017-07-18 DIAGNOSIS — Z299 Encounter for prophylactic measures, unspecified: Secondary | ICD-10-CM | POA: Diagnosis not present

## 2017-07-18 DIAGNOSIS — I8393 Asymptomatic varicose veins of bilateral lower extremities: Secondary | ICD-10-CM | POA: Diagnosis not present

## 2017-07-18 DIAGNOSIS — I10 Essential (primary) hypertension: Secondary | ICD-10-CM | POA: Diagnosis not present

## 2017-07-18 DIAGNOSIS — F329 Major depressive disorder, single episode, unspecified: Secondary | ICD-10-CM | POA: Diagnosis not present

## 2017-07-18 DIAGNOSIS — M7700 Medial epicondylitis, unspecified elbow: Secondary | ICD-10-CM | POA: Diagnosis not present

## 2017-07-18 DIAGNOSIS — M542 Cervicalgia: Secondary | ICD-10-CM | POA: Diagnosis not present

## 2017-07-18 DIAGNOSIS — K7460 Unspecified cirrhosis of liver: Secondary | ICD-10-CM | POA: Diagnosis not present

## 2017-07-18 DIAGNOSIS — Z6832 Body mass index (BMI) 32.0-32.9, adult: Secondary | ICD-10-CM | POA: Diagnosis not present

## 2017-07-18 DIAGNOSIS — I739 Peripheral vascular disease, unspecified: Secondary | ICD-10-CM | POA: Diagnosis not present

## 2017-07-18 DIAGNOSIS — F909 Attention-deficit hyperactivity disorder, unspecified type: Secondary | ICD-10-CM | POA: Diagnosis not present

## 2017-07-18 DIAGNOSIS — E1149 Type 2 diabetes mellitus with other diabetic neurological complication: Secondary | ICD-10-CM | POA: Diagnosis not present

## 2017-07-18 DIAGNOSIS — K769 Liver disease, unspecified: Secondary | ICD-10-CM | POA: Diagnosis not present

## 2017-09-26 DIAGNOSIS — E1151 Type 2 diabetes mellitus with diabetic peripheral angiopathy without gangrene: Secondary | ICD-10-CM | POA: Diagnosis not present

## 2017-09-26 DIAGNOSIS — E1140 Type 2 diabetes mellitus with diabetic neuropathy, unspecified: Secondary | ICD-10-CM | POA: Diagnosis not present

## 2017-09-26 DIAGNOSIS — L89892 Pressure ulcer of other site, stage 2: Secondary | ICD-10-CM | POA: Diagnosis not present

## 2017-10-02 DIAGNOSIS — Z23 Encounter for immunization: Secondary | ICD-10-CM | POA: Diagnosis not present

## 2017-10-03 DIAGNOSIS — F329 Major depressive disorder, single episode, unspecified: Secondary | ICD-10-CM | POA: Diagnosis not present

## 2017-10-03 DIAGNOSIS — R200 Anesthesia of skin: Secondary | ICD-10-CM | POA: Diagnosis not present

## 2017-10-03 DIAGNOSIS — R4781 Slurred speech: Secondary | ICD-10-CM | POA: Diagnosis not present

## 2017-10-03 DIAGNOSIS — Z794 Long term (current) use of insulin: Secondary | ICD-10-CM | POA: Diagnosis not present

## 2017-10-03 DIAGNOSIS — E1141 Type 2 diabetes mellitus with diabetic mononeuropathy: Secondary | ICD-10-CM | POA: Diagnosis not present

## 2017-10-03 DIAGNOSIS — R29818 Other symptoms and signs involving the nervous system: Secondary | ICD-10-CM | POA: Diagnosis not present

## 2017-10-03 DIAGNOSIS — E1341 Other specified diabetes mellitus with diabetic mononeuropathy: Secondary | ICD-10-CM | POA: Diagnosis not present

## 2017-10-03 DIAGNOSIS — M2570 Osteophyte, unspecified joint: Secondary | ICD-10-CM | POA: Diagnosis not present

## 2017-10-03 DIAGNOSIS — I10 Essential (primary) hypertension: Secondary | ICD-10-CM | POA: Diagnosis not present

## 2017-10-03 DIAGNOSIS — N390 Urinary tract infection, site not specified: Secondary | ICD-10-CM | POA: Diagnosis not present

## 2017-10-03 DIAGNOSIS — Z01818 Encounter for other preprocedural examination: Secondary | ICD-10-CM | POA: Diagnosis not present

## 2017-10-03 DIAGNOSIS — M2578 Osteophyte, vertebrae: Secondary | ICD-10-CM | POA: Diagnosis not present

## 2017-10-03 DIAGNOSIS — M47812 Spondylosis without myelopathy or radiculopathy, cervical region: Secondary | ICD-10-CM | POA: Diagnosis not present

## 2017-10-03 DIAGNOSIS — Z87891 Personal history of nicotine dependence: Secondary | ICD-10-CM | POA: Diagnosis not present

## 2017-10-03 DIAGNOSIS — R42 Dizziness and giddiness: Secondary | ICD-10-CM | POA: Diagnosis not present

## 2017-10-03 DIAGNOSIS — Z79899 Other long term (current) drug therapy: Secondary | ICD-10-CM | POA: Diagnosis not present

## 2017-10-03 DIAGNOSIS — A4189 Other specified sepsis: Secondary | ICD-10-CM | POA: Diagnosis not present

## 2017-10-03 DIAGNOSIS — M5030 Other cervical disc degeneration, unspecified cervical region: Secondary | ICD-10-CM | POA: Diagnosis not present

## 2017-10-04 DIAGNOSIS — G92 Toxic encephalopathy: Secondary | ICD-10-CM | POA: Diagnosis present

## 2017-10-04 DIAGNOSIS — I10 Essential (primary) hypertension: Secondary | ICD-10-CM | POA: Diagnosis not present

## 2017-10-04 DIAGNOSIS — E119 Type 2 diabetes mellitus without complications: Secondary | ICD-10-CM | POA: Diagnosis present

## 2017-10-04 DIAGNOSIS — N390 Urinary tract infection, site not specified: Secondary | ICD-10-CM | POA: Diagnosis present

## 2017-10-04 DIAGNOSIS — Z01818 Encounter for other preprocedural examination: Secondary | ICD-10-CM | POA: Diagnosis not present

## 2017-10-04 DIAGNOSIS — A419 Sepsis, unspecified organism: Secondary | ICD-10-CM | POA: Diagnosis present

## 2017-10-04 DIAGNOSIS — Z90710 Acquired absence of both cervix and uterus: Secondary | ICD-10-CM | POA: Diagnosis not present

## 2017-10-04 DIAGNOSIS — R6520 Severe sepsis without septic shock: Secondary | ICD-10-CM | POA: Diagnosis present

## 2017-10-04 DIAGNOSIS — E7800 Pure hypercholesterolemia, unspecified: Secondary | ICD-10-CM | POA: Diagnosis not present

## 2017-10-04 DIAGNOSIS — E1110 Type 2 diabetes mellitus with ketoacidosis without coma: Secondary | ICD-10-CM | POA: Diagnosis not present

## 2017-10-04 DIAGNOSIS — A4189 Other specified sepsis: Secondary | ICD-10-CM | POA: Diagnosis not present

## 2017-10-04 DIAGNOSIS — R410 Disorientation, unspecified: Secondary | ICD-10-CM | POA: Diagnosis not present

## 2017-10-04 DIAGNOSIS — R4781 Slurred speech: Secondary | ICD-10-CM | POA: Diagnosis not present

## 2017-10-04 DIAGNOSIS — B9620 Unspecified Escherichia coli [E. coli] as the cause of diseases classified elsewhere: Secondary | ICD-10-CM | POA: Diagnosis not present

## 2017-10-04 DIAGNOSIS — R29818 Other symptoms and signs involving the nervous system: Secondary | ICD-10-CM | POA: Diagnosis not present

## 2017-10-17 DIAGNOSIS — L89892 Pressure ulcer of other site, stage 2: Secondary | ICD-10-CM | POA: Diagnosis not present

## 2017-10-17 DIAGNOSIS — E1151 Type 2 diabetes mellitus with diabetic peripheral angiopathy without gangrene: Secondary | ICD-10-CM | POA: Diagnosis not present

## 2017-10-17 DIAGNOSIS — E1140 Type 2 diabetes mellitus with diabetic neuropathy, unspecified: Secondary | ICD-10-CM | POA: Diagnosis not present

## 2017-10-24 DIAGNOSIS — K769 Liver disease, unspecified: Secondary | ICD-10-CM | POA: Diagnosis not present

## 2017-10-24 DIAGNOSIS — E1142 Type 2 diabetes mellitus with diabetic polyneuropathy: Secondary | ICD-10-CM | POA: Diagnosis not present

## 2017-10-24 DIAGNOSIS — R350 Frequency of micturition: Secondary | ICD-10-CM | POA: Diagnosis not present

## 2017-10-24 DIAGNOSIS — Z299 Encounter for prophylactic measures, unspecified: Secondary | ICD-10-CM | POA: Diagnosis not present

## 2017-10-24 DIAGNOSIS — K7460 Unspecified cirrhosis of liver: Secondary | ICD-10-CM | POA: Diagnosis not present

## 2017-10-24 DIAGNOSIS — M797 Fibromyalgia: Secondary | ICD-10-CM | POA: Diagnosis not present

## 2017-10-24 DIAGNOSIS — E1165 Type 2 diabetes mellitus with hyperglycemia: Secondary | ICD-10-CM | POA: Diagnosis not present

## 2017-10-24 DIAGNOSIS — Z6833 Body mass index (BMI) 33.0-33.9, adult: Secondary | ICD-10-CM | POA: Diagnosis not present

## 2017-10-24 DIAGNOSIS — I10 Essential (primary) hypertension: Secondary | ICD-10-CM | POA: Diagnosis not present

## 2017-10-24 DIAGNOSIS — I739 Peripheral vascular disease, unspecified: Secondary | ICD-10-CM | POA: Diagnosis not present

## 2017-11-20 DIAGNOSIS — N390 Urinary tract infection, site not specified: Secondary | ICD-10-CM | POA: Diagnosis not present

## 2017-11-20 DIAGNOSIS — R51 Headache: Secondary | ICD-10-CM | POA: Diagnosis not present

## 2017-11-20 DIAGNOSIS — E119 Type 2 diabetes mellitus without complications: Secondary | ICD-10-CM | POA: Diagnosis not present

## 2017-11-20 DIAGNOSIS — R531 Weakness: Secondary | ICD-10-CM | POA: Diagnosis not present

## 2017-11-20 DIAGNOSIS — R4182 Altered mental status, unspecified: Secondary | ICD-10-CM | POA: Diagnosis not present

## 2017-11-20 DIAGNOSIS — Z87891 Personal history of nicotine dependence: Secondary | ICD-10-CM | POA: Diagnosis not present

## 2018-05-21 DIAGNOSIS — I739 Peripheral vascular disease, unspecified: Secondary | ICD-10-CM | POA: Diagnosis not present

## 2018-05-21 DIAGNOSIS — D649 Anemia, unspecified: Secondary | ICD-10-CM | POA: Diagnosis not present

## 2018-05-21 DIAGNOSIS — K259 Gastric ulcer, unspecified as acute or chronic, without hemorrhage or perforation: Secondary | ICD-10-CM | POA: Diagnosis not present

## 2018-05-21 DIAGNOSIS — E1165 Type 2 diabetes mellitus with hyperglycemia: Secondary | ICD-10-CM | POA: Diagnosis not present

## 2018-05-21 DIAGNOSIS — Z299 Encounter for prophylactic measures, unspecified: Secondary | ICD-10-CM | POA: Diagnosis not present

## 2018-05-21 DIAGNOSIS — L97509 Non-pressure chronic ulcer of other part of unspecified foot with unspecified severity: Secondary | ICD-10-CM | POA: Diagnosis not present

## 2018-05-21 DIAGNOSIS — Z6831 Body mass index (BMI) 31.0-31.9, adult: Secondary | ICD-10-CM | POA: Diagnosis not present

## 2018-05-21 DIAGNOSIS — I10 Essential (primary) hypertension: Secondary | ICD-10-CM | POA: Diagnosis not present

## 2018-05-21 DIAGNOSIS — E11621 Type 2 diabetes mellitus with foot ulcer: Secondary | ICD-10-CM | POA: Diagnosis not present

## 2018-06-05 DIAGNOSIS — E119 Type 2 diabetes mellitus without complications: Secondary | ICD-10-CM | POA: Diagnosis not present

## 2018-06-05 DIAGNOSIS — E7800 Pure hypercholesterolemia, unspecified: Secondary | ICD-10-CM | POA: Diagnosis not present

## 2018-06-05 DIAGNOSIS — I10 Essential (primary) hypertension: Secondary | ICD-10-CM | POA: Diagnosis not present

## 2018-06-19 DIAGNOSIS — E1140 Type 2 diabetes mellitus with diabetic neuropathy, unspecified: Secondary | ICD-10-CM | POA: Diagnosis not present

## 2018-06-19 DIAGNOSIS — E1142 Type 2 diabetes mellitus with diabetic polyneuropathy: Secondary | ICD-10-CM | POA: Diagnosis not present

## 2018-06-19 DIAGNOSIS — E11621 Type 2 diabetes mellitus with foot ulcer: Secondary | ICD-10-CM | POA: Diagnosis not present

## 2018-06-19 DIAGNOSIS — I10 Essential (primary) hypertension: Secondary | ICD-10-CM | POA: Diagnosis not present

## 2018-06-19 DIAGNOSIS — L97518 Non-pressure chronic ulcer of other part of right foot with other specified severity: Secondary | ICD-10-CM | POA: Diagnosis not present

## 2018-06-19 DIAGNOSIS — L97519 Non-pressure chronic ulcer of other part of right foot with unspecified severity: Secondary | ICD-10-CM | POA: Diagnosis not present

## 2018-06-19 DIAGNOSIS — L03115 Cellulitis of right lower limb: Secondary | ICD-10-CM | POA: Diagnosis not present

## 2018-06-19 DIAGNOSIS — M869 Osteomyelitis, unspecified: Secondary | ICD-10-CM | POA: Diagnosis not present

## 2018-06-19 DIAGNOSIS — M7989 Other specified soft tissue disorders: Secondary | ICD-10-CM | POA: Diagnosis not present

## 2018-06-19 DIAGNOSIS — E1169 Type 2 diabetes mellitus with other specified complication: Secondary | ICD-10-CM | POA: Diagnosis not present

## 2018-06-19 DIAGNOSIS — M86171 Other acute osteomyelitis, right ankle and foot: Secondary | ICD-10-CM | POA: Diagnosis not present

## 2018-06-19 DIAGNOSIS — L97512 Non-pressure chronic ulcer of other part of right foot with fat layer exposed: Secondary | ICD-10-CM | POA: Diagnosis not present

## 2018-06-19 DIAGNOSIS — D638 Anemia in other chronic diseases classified elsewhere: Secondary | ICD-10-CM | POA: Diagnosis not present

## 2018-06-19 DIAGNOSIS — E119 Type 2 diabetes mellitus without complications: Secondary | ICD-10-CM | POA: Diagnosis not present

## 2018-06-19 DIAGNOSIS — L97514 Non-pressure chronic ulcer of other part of right foot with necrosis of bone: Secondary | ICD-10-CM | POA: Diagnosis not present

## 2018-06-19 DIAGNOSIS — L03031 Cellulitis of right toe: Secondary | ICD-10-CM | POA: Diagnosis not present

## 2018-06-19 DIAGNOSIS — D649 Anemia, unspecified: Secondary | ICD-10-CM | POA: Diagnosis not present

## 2018-06-25 DIAGNOSIS — L97514 Non-pressure chronic ulcer of other part of right foot with necrosis of bone: Secondary | ICD-10-CM | POA: Diagnosis not present

## 2018-06-25 DIAGNOSIS — E1140 Type 2 diabetes mellitus with diabetic neuropathy, unspecified: Secondary | ICD-10-CM | POA: Diagnosis not present

## 2018-06-25 DIAGNOSIS — E1151 Type 2 diabetes mellitus with diabetic peripheral angiopathy without gangrene: Secondary | ICD-10-CM | POA: Diagnosis not present

## 2018-06-25 DIAGNOSIS — E1169 Type 2 diabetes mellitus with other specified complication: Secondary | ICD-10-CM | POA: Diagnosis not present

## 2018-06-25 DIAGNOSIS — D649 Anemia, unspecified: Secondary | ICD-10-CM | POA: Diagnosis not present

## 2018-06-25 DIAGNOSIS — M86171 Other acute osteomyelitis, right ankle and foot: Secondary | ICD-10-CM | POA: Diagnosis not present

## 2018-06-25 DIAGNOSIS — E11621 Type 2 diabetes mellitus with foot ulcer: Secondary | ICD-10-CM | POA: Diagnosis not present

## 2018-06-25 DIAGNOSIS — F329 Major depressive disorder, single episode, unspecified: Secondary | ICD-10-CM | POA: Diagnosis not present

## 2018-06-25 DIAGNOSIS — I10 Essential (primary) hypertension: Secondary | ICD-10-CM | POA: Diagnosis not present

## 2018-06-27 DIAGNOSIS — L97512 Non-pressure chronic ulcer of other part of right foot with fat layer exposed: Secondary | ICD-10-CM | POA: Diagnosis not present

## 2018-06-27 DIAGNOSIS — Z9889 Other specified postprocedural states: Secondary | ICD-10-CM | POA: Diagnosis not present

## 2018-06-27 DIAGNOSIS — Z5189 Encounter for other specified aftercare: Secondary | ICD-10-CM | POA: Diagnosis not present

## 2018-06-27 DIAGNOSIS — E11621 Type 2 diabetes mellitus with foot ulcer: Secondary | ICD-10-CM | POA: Diagnosis not present

## 2018-06-28 DIAGNOSIS — E11621 Type 2 diabetes mellitus with foot ulcer: Secondary | ICD-10-CM | POA: Diagnosis not present

## 2018-06-28 DIAGNOSIS — E1169 Type 2 diabetes mellitus with other specified complication: Secondary | ICD-10-CM | POA: Diagnosis not present

## 2018-06-28 DIAGNOSIS — I10 Essential (primary) hypertension: Secondary | ICD-10-CM | POA: Diagnosis not present

## 2018-06-28 DIAGNOSIS — E1140 Type 2 diabetes mellitus with diabetic neuropathy, unspecified: Secondary | ICD-10-CM | POA: Diagnosis not present

## 2018-06-28 DIAGNOSIS — E1151 Type 2 diabetes mellitus with diabetic peripheral angiopathy without gangrene: Secondary | ICD-10-CM | POA: Diagnosis not present

## 2018-06-28 DIAGNOSIS — D649 Anemia, unspecified: Secondary | ICD-10-CM | POA: Diagnosis not present

## 2018-06-28 DIAGNOSIS — M86171 Other acute osteomyelitis, right ankle and foot: Secondary | ICD-10-CM | POA: Diagnosis not present

## 2018-06-28 DIAGNOSIS — L97514 Non-pressure chronic ulcer of other part of right foot with necrosis of bone: Secondary | ICD-10-CM | POA: Diagnosis not present

## 2018-06-28 DIAGNOSIS — F329 Major depressive disorder, single episode, unspecified: Secondary | ICD-10-CM | POA: Diagnosis not present

## 2018-06-29 DIAGNOSIS — L089 Local infection of the skin and subcutaneous tissue, unspecified: Secondary | ICD-10-CM | POA: Diagnosis not present

## 2018-06-29 DIAGNOSIS — I10 Essential (primary) hypertension: Secondary | ICD-10-CM | POA: Diagnosis not present

## 2018-06-29 DIAGNOSIS — Z299 Encounter for prophylactic measures, unspecified: Secondary | ICD-10-CM | POA: Diagnosis not present

## 2018-06-29 DIAGNOSIS — E11621 Type 2 diabetes mellitus with foot ulcer: Secondary | ICD-10-CM | POA: Diagnosis not present

## 2018-06-29 DIAGNOSIS — E1165 Type 2 diabetes mellitus with hyperglycemia: Secondary | ICD-10-CM | POA: Diagnosis not present

## 2018-06-29 DIAGNOSIS — L97509 Non-pressure chronic ulcer of other part of unspecified foot with unspecified severity: Secondary | ICD-10-CM | POA: Diagnosis not present

## 2018-06-29 DIAGNOSIS — Z6831 Body mass index (BMI) 31.0-31.9, adult: Secondary | ICD-10-CM | POA: Diagnosis not present

## 2018-07-02 DIAGNOSIS — E1151 Type 2 diabetes mellitus with diabetic peripheral angiopathy without gangrene: Secondary | ICD-10-CM | POA: Diagnosis not present

## 2018-07-02 DIAGNOSIS — I10 Essential (primary) hypertension: Secondary | ICD-10-CM | POA: Diagnosis not present

## 2018-07-02 DIAGNOSIS — E1169 Type 2 diabetes mellitus with other specified complication: Secondary | ICD-10-CM | POA: Diagnosis not present

## 2018-07-02 DIAGNOSIS — M86171 Other acute osteomyelitis, right ankle and foot: Secondary | ICD-10-CM | POA: Diagnosis not present

## 2018-07-02 DIAGNOSIS — E11621 Type 2 diabetes mellitus with foot ulcer: Secondary | ICD-10-CM | POA: Diagnosis not present

## 2018-07-02 DIAGNOSIS — E1140 Type 2 diabetes mellitus with diabetic neuropathy, unspecified: Secondary | ICD-10-CM | POA: Diagnosis not present

## 2018-07-02 DIAGNOSIS — L97514 Non-pressure chronic ulcer of other part of right foot with necrosis of bone: Secondary | ICD-10-CM | POA: Diagnosis not present

## 2018-07-02 DIAGNOSIS — D649 Anemia, unspecified: Secondary | ICD-10-CM | POA: Diagnosis not present

## 2018-07-02 DIAGNOSIS — F329 Major depressive disorder, single episode, unspecified: Secondary | ICD-10-CM | POA: Diagnosis not present

## 2018-07-04 DIAGNOSIS — E1140 Type 2 diabetes mellitus with diabetic neuropathy, unspecified: Secondary | ICD-10-CM | POA: Diagnosis not present

## 2018-07-04 DIAGNOSIS — E1151 Type 2 diabetes mellitus with diabetic peripheral angiopathy without gangrene: Secondary | ICD-10-CM | POA: Diagnosis not present

## 2018-07-04 DIAGNOSIS — F329 Major depressive disorder, single episode, unspecified: Secondary | ICD-10-CM | POA: Diagnosis not present

## 2018-07-04 DIAGNOSIS — L97514 Non-pressure chronic ulcer of other part of right foot with necrosis of bone: Secondary | ICD-10-CM | POA: Diagnosis not present

## 2018-07-04 DIAGNOSIS — E1169 Type 2 diabetes mellitus with other specified complication: Secondary | ICD-10-CM | POA: Diagnosis not present

## 2018-07-04 DIAGNOSIS — E11621 Type 2 diabetes mellitus with foot ulcer: Secondary | ICD-10-CM | POA: Diagnosis not present

## 2018-07-04 DIAGNOSIS — D649 Anemia, unspecified: Secondary | ICD-10-CM | POA: Diagnosis not present

## 2018-07-04 DIAGNOSIS — I10 Essential (primary) hypertension: Secondary | ICD-10-CM | POA: Diagnosis not present

## 2018-07-04 DIAGNOSIS — M86171 Other acute osteomyelitis, right ankle and foot: Secondary | ICD-10-CM | POA: Diagnosis not present

## 2018-07-05 DIAGNOSIS — E1151 Type 2 diabetes mellitus with diabetic peripheral angiopathy without gangrene: Secondary | ICD-10-CM | POA: Diagnosis not present

## 2018-07-05 DIAGNOSIS — M86171 Other acute osteomyelitis, right ankle and foot: Secondary | ICD-10-CM | POA: Diagnosis not present

## 2018-07-05 DIAGNOSIS — E11621 Type 2 diabetes mellitus with foot ulcer: Secondary | ICD-10-CM | POA: Diagnosis not present

## 2018-07-05 DIAGNOSIS — L97514 Non-pressure chronic ulcer of other part of right foot with necrosis of bone: Secondary | ICD-10-CM | POA: Diagnosis not present

## 2018-07-05 DIAGNOSIS — F329 Major depressive disorder, single episode, unspecified: Secondary | ICD-10-CM | POA: Diagnosis not present

## 2018-07-05 DIAGNOSIS — Z9889 Other specified postprocedural states: Secondary | ICD-10-CM | POA: Diagnosis not present

## 2018-07-05 DIAGNOSIS — L97512 Non-pressure chronic ulcer of other part of right foot with fat layer exposed: Secondary | ICD-10-CM | POA: Diagnosis not present

## 2018-07-05 DIAGNOSIS — E1140 Type 2 diabetes mellitus with diabetic neuropathy, unspecified: Secondary | ICD-10-CM | POA: Diagnosis not present

## 2018-07-05 DIAGNOSIS — E1169 Type 2 diabetes mellitus with other specified complication: Secondary | ICD-10-CM | POA: Diagnosis not present

## 2018-07-05 DIAGNOSIS — I10 Essential (primary) hypertension: Secondary | ICD-10-CM | POA: Diagnosis not present

## 2018-07-05 DIAGNOSIS — D649 Anemia, unspecified: Secondary | ICD-10-CM | POA: Diagnosis not present

## 2018-07-05 DIAGNOSIS — Z5189 Encounter for other specified aftercare: Secondary | ICD-10-CM | POA: Diagnosis not present

## 2018-07-09 DIAGNOSIS — F329 Major depressive disorder, single episode, unspecified: Secondary | ICD-10-CM | POA: Diagnosis not present

## 2018-07-09 DIAGNOSIS — E1151 Type 2 diabetes mellitus with diabetic peripheral angiopathy without gangrene: Secondary | ICD-10-CM | POA: Diagnosis not present

## 2018-07-09 DIAGNOSIS — L97518 Non-pressure chronic ulcer of other part of right foot with other specified severity: Secondary | ICD-10-CM | POA: Diagnosis not present

## 2018-07-09 DIAGNOSIS — D649 Anemia, unspecified: Secondary | ICD-10-CM | POA: Diagnosis not present

## 2018-07-09 DIAGNOSIS — E11621 Type 2 diabetes mellitus with foot ulcer: Secondary | ICD-10-CM | POA: Diagnosis not present

## 2018-07-09 DIAGNOSIS — L97514 Non-pressure chronic ulcer of other part of right foot with necrosis of bone: Secondary | ICD-10-CM | POA: Diagnosis not present

## 2018-07-09 DIAGNOSIS — I10 Essential (primary) hypertension: Secondary | ICD-10-CM | POA: Diagnosis not present

## 2018-07-09 DIAGNOSIS — E1169 Type 2 diabetes mellitus with other specified complication: Secondary | ICD-10-CM | POA: Diagnosis not present

## 2018-07-09 DIAGNOSIS — E1140 Type 2 diabetes mellitus with diabetic neuropathy, unspecified: Secondary | ICD-10-CM | POA: Diagnosis not present

## 2018-07-09 DIAGNOSIS — M86171 Other acute osteomyelitis, right ankle and foot: Secondary | ICD-10-CM | POA: Diagnosis not present

## 2018-07-10 DIAGNOSIS — E1140 Type 2 diabetes mellitus with diabetic neuropathy, unspecified: Secondary | ICD-10-CM | POA: Diagnosis not present

## 2018-07-10 DIAGNOSIS — M86171 Other acute osteomyelitis, right ankle and foot: Secondary | ICD-10-CM | POA: Diagnosis not present

## 2018-07-10 DIAGNOSIS — E1151 Type 2 diabetes mellitus with diabetic peripheral angiopathy without gangrene: Secondary | ICD-10-CM | POA: Diagnosis not present

## 2018-07-10 DIAGNOSIS — E11621 Type 2 diabetes mellitus with foot ulcer: Secondary | ICD-10-CM | POA: Diagnosis not present

## 2018-07-10 DIAGNOSIS — L97514 Non-pressure chronic ulcer of other part of right foot with necrosis of bone: Secondary | ICD-10-CM | POA: Diagnosis not present

## 2018-07-10 DIAGNOSIS — F329 Major depressive disorder, single episode, unspecified: Secondary | ICD-10-CM | POA: Diagnosis not present

## 2018-07-10 DIAGNOSIS — I10 Essential (primary) hypertension: Secondary | ICD-10-CM | POA: Diagnosis not present

## 2018-07-10 DIAGNOSIS — D649 Anemia, unspecified: Secondary | ICD-10-CM | POA: Diagnosis not present

## 2018-07-10 DIAGNOSIS — E1169 Type 2 diabetes mellitus with other specified complication: Secondary | ICD-10-CM | POA: Diagnosis not present

## 2018-07-11 DIAGNOSIS — E1140 Type 2 diabetes mellitus with diabetic neuropathy, unspecified: Secondary | ICD-10-CM | POA: Diagnosis not present

## 2018-07-11 DIAGNOSIS — I10 Essential (primary) hypertension: Secondary | ICD-10-CM | POA: Diagnosis not present

## 2018-07-11 DIAGNOSIS — E1151 Type 2 diabetes mellitus with diabetic peripheral angiopathy without gangrene: Secondary | ICD-10-CM | POA: Diagnosis not present

## 2018-07-11 DIAGNOSIS — F329 Major depressive disorder, single episode, unspecified: Secondary | ICD-10-CM | POA: Diagnosis not present

## 2018-07-11 DIAGNOSIS — D649 Anemia, unspecified: Secondary | ICD-10-CM | POA: Diagnosis not present

## 2018-07-11 DIAGNOSIS — E11621 Type 2 diabetes mellitus with foot ulcer: Secondary | ICD-10-CM | POA: Diagnosis not present

## 2018-07-11 DIAGNOSIS — E1169 Type 2 diabetes mellitus with other specified complication: Secondary | ICD-10-CM | POA: Diagnosis not present

## 2018-07-11 DIAGNOSIS — L97514 Non-pressure chronic ulcer of other part of right foot with necrosis of bone: Secondary | ICD-10-CM | POA: Diagnosis not present

## 2018-07-11 DIAGNOSIS — M86171 Other acute osteomyelitis, right ankle and foot: Secondary | ICD-10-CM | POA: Diagnosis not present

## 2018-07-12 DIAGNOSIS — M86171 Other acute osteomyelitis, right ankle and foot: Secondary | ICD-10-CM | POA: Diagnosis not present

## 2018-07-12 DIAGNOSIS — E1169 Type 2 diabetes mellitus with other specified complication: Secondary | ICD-10-CM | POA: Diagnosis not present

## 2018-07-12 DIAGNOSIS — E1140 Type 2 diabetes mellitus with diabetic neuropathy, unspecified: Secondary | ICD-10-CM | POA: Diagnosis not present

## 2018-07-12 DIAGNOSIS — D649 Anemia, unspecified: Secondary | ICD-10-CM | POA: Diagnosis not present

## 2018-07-12 DIAGNOSIS — I10 Essential (primary) hypertension: Secondary | ICD-10-CM | POA: Diagnosis not present

## 2018-07-12 DIAGNOSIS — L97514 Non-pressure chronic ulcer of other part of right foot with necrosis of bone: Secondary | ICD-10-CM | POA: Diagnosis not present

## 2018-07-12 DIAGNOSIS — E1151 Type 2 diabetes mellitus with diabetic peripheral angiopathy without gangrene: Secondary | ICD-10-CM | POA: Diagnosis not present

## 2018-07-12 DIAGNOSIS — F329 Major depressive disorder, single episode, unspecified: Secondary | ICD-10-CM | POA: Diagnosis not present

## 2018-07-12 DIAGNOSIS — E11621 Type 2 diabetes mellitus with foot ulcer: Secondary | ICD-10-CM | POA: Diagnosis not present

## 2018-07-16 DIAGNOSIS — Z9889 Other specified postprocedural states: Secondary | ICD-10-CM | POA: Diagnosis not present

## 2018-07-16 DIAGNOSIS — E11621 Type 2 diabetes mellitus with foot ulcer: Secondary | ICD-10-CM | POA: Diagnosis not present

## 2018-07-16 DIAGNOSIS — L97514 Non-pressure chronic ulcer of other part of right foot with necrosis of bone: Secondary | ICD-10-CM | POA: Diagnosis not present

## 2018-07-17 DIAGNOSIS — E1169 Type 2 diabetes mellitus with other specified complication: Secondary | ICD-10-CM | POA: Diagnosis not present

## 2018-07-17 DIAGNOSIS — D649 Anemia, unspecified: Secondary | ICD-10-CM | POA: Diagnosis not present

## 2018-07-17 DIAGNOSIS — F329 Major depressive disorder, single episode, unspecified: Secondary | ICD-10-CM | POA: Diagnosis not present

## 2018-07-17 DIAGNOSIS — E11621 Type 2 diabetes mellitus with foot ulcer: Secondary | ICD-10-CM | POA: Diagnosis not present

## 2018-07-17 DIAGNOSIS — L97514 Non-pressure chronic ulcer of other part of right foot with necrosis of bone: Secondary | ICD-10-CM | POA: Diagnosis not present

## 2018-07-17 DIAGNOSIS — I10 Essential (primary) hypertension: Secondary | ICD-10-CM | POA: Diagnosis not present

## 2018-07-17 DIAGNOSIS — E1140 Type 2 diabetes mellitus with diabetic neuropathy, unspecified: Secondary | ICD-10-CM | POA: Diagnosis not present

## 2018-07-17 DIAGNOSIS — M86171 Other acute osteomyelitis, right ankle and foot: Secondary | ICD-10-CM | POA: Diagnosis not present

## 2018-07-17 DIAGNOSIS — E1151 Type 2 diabetes mellitus with diabetic peripheral angiopathy without gangrene: Secondary | ICD-10-CM | POA: Diagnosis not present

## 2018-07-19 DIAGNOSIS — E1169 Type 2 diabetes mellitus with other specified complication: Secondary | ICD-10-CM | POA: Diagnosis not present

## 2018-07-19 DIAGNOSIS — F329 Major depressive disorder, single episode, unspecified: Secondary | ICD-10-CM | POA: Diagnosis not present

## 2018-07-19 DIAGNOSIS — I10 Essential (primary) hypertension: Secondary | ICD-10-CM | POA: Diagnosis not present

## 2018-07-19 DIAGNOSIS — D649 Anemia, unspecified: Secondary | ICD-10-CM | POA: Diagnosis not present

## 2018-07-19 DIAGNOSIS — E1140 Type 2 diabetes mellitus with diabetic neuropathy, unspecified: Secondary | ICD-10-CM | POA: Diagnosis not present

## 2018-07-19 DIAGNOSIS — L97514 Non-pressure chronic ulcer of other part of right foot with necrosis of bone: Secondary | ICD-10-CM | POA: Diagnosis not present

## 2018-07-19 DIAGNOSIS — M86171 Other acute osteomyelitis, right ankle and foot: Secondary | ICD-10-CM | POA: Diagnosis not present

## 2018-07-19 DIAGNOSIS — E11621 Type 2 diabetes mellitus with foot ulcer: Secondary | ICD-10-CM | POA: Diagnosis not present

## 2018-07-19 DIAGNOSIS — E1151 Type 2 diabetes mellitus with diabetic peripheral angiopathy without gangrene: Secondary | ICD-10-CM | POA: Diagnosis not present

## 2018-07-20 DIAGNOSIS — E1165 Type 2 diabetes mellitus with hyperglycemia: Secondary | ICD-10-CM | POA: Diagnosis not present

## 2018-07-20 DIAGNOSIS — K7460 Unspecified cirrhosis of liver: Secondary | ICD-10-CM | POA: Diagnosis not present

## 2018-07-20 DIAGNOSIS — I739 Peripheral vascular disease, unspecified: Secondary | ICD-10-CM | POA: Diagnosis not present

## 2018-07-20 DIAGNOSIS — Z6830 Body mass index (BMI) 30.0-30.9, adult: Secondary | ICD-10-CM | POA: Diagnosis not present

## 2018-07-20 DIAGNOSIS — K769 Liver disease, unspecified: Secondary | ICD-10-CM | POA: Diagnosis not present

## 2018-07-20 DIAGNOSIS — Z299 Encounter for prophylactic measures, unspecified: Secondary | ICD-10-CM | POA: Diagnosis not present

## 2018-07-20 DIAGNOSIS — I10 Essential (primary) hypertension: Secondary | ICD-10-CM | POA: Diagnosis not present

## 2018-07-23 DIAGNOSIS — E1169 Type 2 diabetes mellitus with other specified complication: Secondary | ICD-10-CM | POA: Diagnosis not present

## 2018-07-23 DIAGNOSIS — E1151 Type 2 diabetes mellitus with diabetic peripheral angiopathy without gangrene: Secondary | ICD-10-CM | POA: Diagnosis not present

## 2018-07-23 DIAGNOSIS — F329 Major depressive disorder, single episode, unspecified: Secondary | ICD-10-CM | POA: Diagnosis not present

## 2018-07-23 DIAGNOSIS — E11621 Type 2 diabetes mellitus with foot ulcer: Secondary | ICD-10-CM | POA: Diagnosis not present

## 2018-07-23 DIAGNOSIS — E1140 Type 2 diabetes mellitus with diabetic neuropathy, unspecified: Secondary | ICD-10-CM | POA: Diagnosis not present

## 2018-07-23 DIAGNOSIS — L97514 Non-pressure chronic ulcer of other part of right foot with necrosis of bone: Secondary | ICD-10-CM | POA: Diagnosis not present

## 2018-07-23 DIAGNOSIS — I10 Essential (primary) hypertension: Secondary | ICD-10-CM | POA: Diagnosis not present

## 2018-07-23 DIAGNOSIS — D649 Anemia, unspecified: Secondary | ICD-10-CM | POA: Diagnosis not present

## 2018-07-23 DIAGNOSIS — M86171 Other acute osteomyelitis, right ankle and foot: Secondary | ICD-10-CM | POA: Diagnosis not present

## 2018-07-24 DIAGNOSIS — D649 Anemia, unspecified: Secondary | ICD-10-CM | POA: Diagnosis not present

## 2018-07-24 DIAGNOSIS — E1140 Type 2 diabetes mellitus with diabetic neuropathy, unspecified: Secondary | ICD-10-CM | POA: Diagnosis not present

## 2018-07-24 DIAGNOSIS — I10 Essential (primary) hypertension: Secondary | ICD-10-CM | POA: Diagnosis not present

## 2018-07-24 DIAGNOSIS — E1151 Type 2 diabetes mellitus with diabetic peripheral angiopathy without gangrene: Secondary | ICD-10-CM | POA: Diagnosis not present

## 2018-07-24 DIAGNOSIS — E1169 Type 2 diabetes mellitus with other specified complication: Secondary | ICD-10-CM | POA: Diagnosis not present

## 2018-07-24 DIAGNOSIS — F329 Major depressive disorder, single episode, unspecified: Secondary | ICD-10-CM | POA: Diagnosis not present

## 2018-07-24 DIAGNOSIS — E11621 Type 2 diabetes mellitus with foot ulcer: Secondary | ICD-10-CM | POA: Diagnosis not present

## 2018-07-24 DIAGNOSIS — L97514 Non-pressure chronic ulcer of other part of right foot with necrosis of bone: Secondary | ICD-10-CM | POA: Diagnosis not present

## 2018-07-24 DIAGNOSIS — M86171 Other acute osteomyelitis, right ankle and foot: Secondary | ICD-10-CM | POA: Diagnosis not present

## 2018-07-26 DIAGNOSIS — E1169 Type 2 diabetes mellitus with other specified complication: Secondary | ICD-10-CM | POA: Diagnosis not present

## 2018-07-26 DIAGNOSIS — E1140 Type 2 diabetes mellitus with diabetic neuropathy, unspecified: Secondary | ICD-10-CM | POA: Diagnosis not present

## 2018-07-26 DIAGNOSIS — E11621 Type 2 diabetes mellitus with foot ulcer: Secondary | ICD-10-CM | POA: Diagnosis not present

## 2018-07-26 DIAGNOSIS — I10 Essential (primary) hypertension: Secondary | ICD-10-CM | POA: Diagnosis not present

## 2018-07-26 DIAGNOSIS — M86171 Other acute osteomyelitis, right ankle and foot: Secondary | ICD-10-CM | POA: Diagnosis not present

## 2018-07-26 DIAGNOSIS — D649 Anemia, unspecified: Secondary | ICD-10-CM | POA: Diagnosis not present

## 2018-07-26 DIAGNOSIS — L97514 Non-pressure chronic ulcer of other part of right foot with necrosis of bone: Secondary | ICD-10-CM | POA: Diagnosis not present

## 2018-07-26 DIAGNOSIS — F329 Major depressive disorder, single episode, unspecified: Secondary | ICD-10-CM | POA: Diagnosis not present

## 2018-07-26 DIAGNOSIS — E1151 Type 2 diabetes mellitus with diabetic peripheral angiopathy without gangrene: Secondary | ICD-10-CM | POA: Diagnosis not present

## 2018-07-30 DIAGNOSIS — E11621 Type 2 diabetes mellitus with foot ulcer: Secondary | ICD-10-CM | POA: Diagnosis not present

## 2018-07-30 DIAGNOSIS — L97514 Non-pressure chronic ulcer of other part of right foot with necrosis of bone: Secondary | ICD-10-CM | POA: Diagnosis not present

## 2018-07-30 DIAGNOSIS — L97518 Non-pressure chronic ulcer of other part of right foot with other specified severity: Secondary | ICD-10-CM | POA: Diagnosis not present

## 2018-07-31 DIAGNOSIS — E1151 Type 2 diabetes mellitus with diabetic peripheral angiopathy without gangrene: Secondary | ICD-10-CM | POA: Diagnosis not present

## 2018-07-31 DIAGNOSIS — E1169 Type 2 diabetes mellitus with other specified complication: Secondary | ICD-10-CM | POA: Diagnosis not present

## 2018-07-31 DIAGNOSIS — M86171 Other acute osteomyelitis, right ankle and foot: Secondary | ICD-10-CM | POA: Diagnosis not present

## 2018-07-31 DIAGNOSIS — L97514 Non-pressure chronic ulcer of other part of right foot with necrosis of bone: Secondary | ICD-10-CM | POA: Diagnosis not present

## 2018-07-31 DIAGNOSIS — D649 Anemia, unspecified: Secondary | ICD-10-CM | POA: Diagnosis not present

## 2018-07-31 DIAGNOSIS — E11621 Type 2 diabetes mellitus with foot ulcer: Secondary | ICD-10-CM | POA: Diagnosis not present

## 2018-07-31 DIAGNOSIS — E1140 Type 2 diabetes mellitus with diabetic neuropathy, unspecified: Secondary | ICD-10-CM | POA: Diagnosis not present

## 2018-07-31 DIAGNOSIS — F329 Major depressive disorder, single episode, unspecified: Secondary | ICD-10-CM | POA: Diagnosis not present

## 2018-07-31 DIAGNOSIS — I10 Essential (primary) hypertension: Secondary | ICD-10-CM | POA: Diagnosis not present

## 2018-08-01 DIAGNOSIS — E119 Type 2 diabetes mellitus without complications: Secondary | ICD-10-CM | POA: Diagnosis not present

## 2018-08-01 DIAGNOSIS — Z135 Encounter for screening for eye and ear disorders: Secondary | ICD-10-CM | POA: Diagnosis not present

## 2018-08-01 DIAGNOSIS — H2513 Age-related nuclear cataract, bilateral: Secondary | ICD-10-CM | POA: Diagnosis not present

## 2018-08-01 DIAGNOSIS — H5203 Hypermetropia, bilateral: Secondary | ICD-10-CM | POA: Diagnosis not present

## 2018-08-02 DIAGNOSIS — F329 Major depressive disorder, single episode, unspecified: Secondary | ICD-10-CM | POA: Diagnosis not present

## 2018-08-02 DIAGNOSIS — D649 Anemia, unspecified: Secondary | ICD-10-CM | POA: Diagnosis not present

## 2018-08-02 DIAGNOSIS — E1140 Type 2 diabetes mellitus with diabetic neuropathy, unspecified: Secondary | ICD-10-CM | POA: Diagnosis not present

## 2018-08-02 DIAGNOSIS — M86171 Other acute osteomyelitis, right ankle and foot: Secondary | ICD-10-CM | POA: Diagnosis not present

## 2018-08-02 DIAGNOSIS — E1151 Type 2 diabetes mellitus with diabetic peripheral angiopathy without gangrene: Secondary | ICD-10-CM | POA: Diagnosis not present

## 2018-08-02 DIAGNOSIS — I10 Essential (primary) hypertension: Secondary | ICD-10-CM | POA: Diagnosis not present

## 2018-08-02 DIAGNOSIS — E11621 Type 2 diabetes mellitus with foot ulcer: Secondary | ICD-10-CM | POA: Diagnosis not present

## 2018-08-02 DIAGNOSIS — L97514 Non-pressure chronic ulcer of other part of right foot with necrosis of bone: Secondary | ICD-10-CM | POA: Diagnosis not present

## 2018-08-02 DIAGNOSIS — E1169 Type 2 diabetes mellitus with other specified complication: Secondary | ICD-10-CM | POA: Diagnosis not present

## 2018-08-03 DIAGNOSIS — L97514 Non-pressure chronic ulcer of other part of right foot with necrosis of bone: Secondary | ICD-10-CM | POA: Diagnosis not present

## 2018-08-03 DIAGNOSIS — E11621 Type 2 diabetes mellitus with foot ulcer: Secondary | ICD-10-CM | POA: Diagnosis not present

## 2018-08-03 DIAGNOSIS — Z9889 Other specified postprocedural states: Secondary | ICD-10-CM | POA: Diagnosis not present

## 2018-08-03 DIAGNOSIS — Z5189 Encounter for other specified aftercare: Secondary | ICD-10-CM | POA: Diagnosis not present

## 2018-08-06 DIAGNOSIS — F329 Major depressive disorder, single episode, unspecified: Secondary | ICD-10-CM | POA: Diagnosis not present

## 2018-08-06 DIAGNOSIS — I10 Essential (primary) hypertension: Secondary | ICD-10-CM | POA: Diagnosis not present

## 2018-08-06 DIAGNOSIS — E1151 Type 2 diabetes mellitus with diabetic peripheral angiopathy without gangrene: Secondary | ICD-10-CM | POA: Diagnosis not present

## 2018-08-06 DIAGNOSIS — E11621 Type 2 diabetes mellitus with foot ulcer: Secondary | ICD-10-CM | POA: Diagnosis not present

## 2018-08-06 DIAGNOSIS — M86171 Other acute osteomyelitis, right ankle and foot: Secondary | ICD-10-CM | POA: Diagnosis not present

## 2018-08-06 DIAGNOSIS — E1169 Type 2 diabetes mellitus with other specified complication: Secondary | ICD-10-CM | POA: Diagnosis not present

## 2018-08-06 DIAGNOSIS — L97514 Non-pressure chronic ulcer of other part of right foot with necrosis of bone: Secondary | ICD-10-CM | POA: Diagnosis not present

## 2018-08-06 DIAGNOSIS — D649 Anemia, unspecified: Secondary | ICD-10-CM | POA: Diagnosis not present

## 2018-08-06 DIAGNOSIS — E1140 Type 2 diabetes mellitus with diabetic neuropathy, unspecified: Secondary | ICD-10-CM | POA: Diagnosis not present

## 2018-08-07 DIAGNOSIS — D649 Anemia, unspecified: Secondary | ICD-10-CM | POA: Diagnosis not present

## 2018-08-07 DIAGNOSIS — M86171 Other acute osteomyelitis, right ankle and foot: Secondary | ICD-10-CM | POA: Diagnosis not present

## 2018-08-07 DIAGNOSIS — E11621 Type 2 diabetes mellitus with foot ulcer: Secondary | ICD-10-CM | POA: Diagnosis not present

## 2018-08-07 DIAGNOSIS — E1151 Type 2 diabetes mellitus with diabetic peripheral angiopathy without gangrene: Secondary | ICD-10-CM | POA: Diagnosis not present

## 2018-08-07 DIAGNOSIS — F329 Major depressive disorder, single episode, unspecified: Secondary | ICD-10-CM | POA: Diagnosis not present

## 2018-08-07 DIAGNOSIS — L97514 Non-pressure chronic ulcer of other part of right foot with necrosis of bone: Secondary | ICD-10-CM | POA: Diagnosis not present

## 2018-08-07 DIAGNOSIS — E1169 Type 2 diabetes mellitus with other specified complication: Secondary | ICD-10-CM | POA: Diagnosis not present

## 2018-08-07 DIAGNOSIS — I10 Essential (primary) hypertension: Secondary | ICD-10-CM | POA: Diagnosis not present

## 2018-08-07 DIAGNOSIS — E1140 Type 2 diabetes mellitus with diabetic neuropathy, unspecified: Secondary | ICD-10-CM | POA: Diagnosis not present

## 2018-08-09 DIAGNOSIS — M86171 Other acute osteomyelitis, right ankle and foot: Secondary | ICD-10-CM | POA: Diagnosis not present

## 2018-08-09 DIAGNOSIS — E1140 Type 2 diabetes mellitus with diabetic neuropathy, unspecified: Secondary | ICD-10-CM | POA: Diagnosis not present

## 2018-08-09 DIAGNOSIS — D649 Anemia, unspecified: Secondary | ICD-10-CM | POA: Diagnosis not present

## 2018-08-09 DIAGNOSIS — I10 Essential (primary) hypertension: Secondary | ICD-10-CM | POA: Diagnosis not present

## 2018-08-09 DIAGNOSIS — E1169 Type 2 diabetes mellitus with other specified complication: Secondary | ICD-10-CM | POA: Diagnosis not present

## 2018-08-09 DIAGNOSIS — E1151 Type 2 diabetes mellitus with diabetic peripheral angiopathy without gangrene: Secondary | ICD-10-CM | POA: Diagnosis not present

## 2018-08-09 DIAGNOSIS — E11621 Type 2 diabetes mellitus with foot ulcer: Secondary | ICD-10-CM | POA: Diagnosis not present

## 2018-08-09 DIAGNOSIS — L97514 Non-pressure chronic ulcer of other part of right foot with necrosis of bone: Secondary | ICD-10-CM | POA: Diagnosis not present

## 2018-08-09 DIAGNOSIS — F329 Major depressive disorder, single episode, unspecified: Secondary | ICD-10-CM | POA: Diagnosis not present

## 2018-08-14 DIAGNOSIS — F329 Major depressive disorder, single episode, unspecified: Secondary | ICD-10-CM | POA: Diagnosis not present

## 2018-08-14 DIAGNOSIS — M86171 Other acute osteomyelitis, right ankle and foot: Secondary | ICD-10-CM | POA: Diagnosis not present

## 2018-08-14 DIAGNOSIS — I10 Essential (primary) hypertension: Secondary | ICD-10-CM | POA: Diagnosis not present

## 2018-08-14 DIAGNOSIS — E1169 Type 2 diabetes mellitus with other specified complication: Secondary | ICD-10-CM | POA: Diagnosis not present

## 2018-08-14 DIAGNOSIS — L97514 Non-pressure chronic ulcer of other part of right foot with necrosis of bone: Secondary | ICD-10-CM | POA: Diagnosis not present

## 2018-08-14 DIAGNOSIS — E1151 Type 2 diabetes mellitus with diabetic peripheral angiopathy without gangrene: Secondary | ICD-10-CM | POA: Diagnosis not present

## 2018-08-14 DIAGNOSIS — E11621 Type 2 diabetes mellitus with foot ulcer: Secondary | ICD-10-CM | POA: Diagnosis not present

## 2018-08-14 DIAGNOSIS — D649 Anemia, unspecified: Secondary | ICD-10-CM | POA: Diagnosis not present

## 2018-08-14 DIAGNOSIS — E1140 Type 2 diabetes mellitus with diabetic neuropathy, unspecified: Secondary | ICD-10-CM | POA: Diagnosis not present

## 2018-08-16 DIAGNOSIS — I10 Essential (primary) hypertension: Secondary | ICD-10-CM | POA: Diagnosis not present

## 2018-08-16 DIAGNOSIS — F329 Major depressive disorder, single episode, unspecified: Secondary | ICD-10-CM | POA: Diagnosis not present

## 2018-08-16 DIAGNOSIS — E1140 Type 2 diabetes mellitus with diabetic neuropathy, unspecified: Secondary | ICD-10-CM | POA: Diagnosis not present

## 2018-08-16 DIAGNOSIS — D649 Anemia, unspecified: Secondary | ICD-10-CM | POA: Diagnosis not present

## 2018-08-16 DIAGNOSIS — E11621 Type 2 diabetes mellitus with foot ulcer: Secondary | ICD-10-CM | POA: Diagnosis not present

## 2018-08-16 DIAGNOSIS — E1169 Type 2 diabetes mellitus with other specified complication: Secondary | ICD-10-CM | POA: Diagnosis not present

## 2018-08-16 DIAGNOSIS — L97514 Non-pressure chronic ulcer of other part of right foot with necrosis of bone: Secondary | ICD-10-CM | POA: Diagnosis not present

## 2018-08-16 DIAGNOSIS — M86171 Other acute osteomyelitis, right ankle and foot: Secondary | ICD-10-CM | POA: Diagnosis not present

## 2018-08-16 DIAGNOSIS — E1151 Type 2 diabetes mellitus with diabetic peripheral angiopathy without gangrene: Secondary | ICD-10-CM | POA: Diagnosis not present

## 2018-08-21 DIAGNOSIS — M86171 Other acute osteomyelitis, right ankle and foot: Secondary | ICD-10-CM | POA: Diagnosis not present

## 2018-08-21 DIAGNOSIS — L97514 Non-pressure chronic ulcer of other part of right foot with necrosis of bone: Secondary | ICD-10-CM | POA: Diagnosis not present

## 2018-08-21 DIAGNOSIS — E1140 Type 2 diabetes mellitus with diabetic neuropathy, unspecified: Secondary | ICD-10-CM | POA: Diagnosis not present

## 2018-08-21 DIAGNOSIS — E1151 Type 2 diabetes mellitus with diabetic peripheral angiopathy without gangrene: Secondary | ICD-10-CM | POA: Diagnosis not present

## 2018-08-21 DIAGNOSIS — F329 Major depressive disorder, single episode, unspecified: Secondary | ICD-10-CM | POA: Diagnosis not present

## 2018-08-21 DIAGNOSIS — I10 Essential (primary) hypertension: Secondary | ICD-10-CM | POA: Diagnosis not present

## 2018-08-21 DIAGNOSIS — E11621 Type 2 diabetes mellitus with foot ulcer: Secondary | ICD-10-CM | POA: Diagnosis not present

## 2018-08-21 DIAGNOSIS — D649 Anemia, unspecified: Secondary | ICD-10-CM | POA: Diagnosis not present

## 2018-08-21 DIAGNOSIS — E1169 Type 2 diabetes mellitus with other specified complication: Secondary | ICD-10-CM | POA: Diagnosis not present

## 2018-08-22 DIAGNOSIS — Z9889 Other specified postprocedural states: Secondary | ICD-10-CM | POA: Diagnosis not present

## 2018-08-22 DIAGNOSIS — Z23 Encounter for immunization: Secondary | ICD-10-CM | POA: Diagnosis not present

## 2018-08-22 DIAGNOSIS — E11621 Type 2 diabetes mellitus with foot ulcer: Secondary | ICD-10-CM | POA: Diagnosis not present

## 2018-08-22 DIAGNOSIS — Z5189 Encounter for other specified aftercare: Secondary | ICD-10-CM | POA: Diagnosis not present

## 2018-08-22 DIAGNOSIS — L97514 Non-pressure chronic ulcer of other part of right foot with necrosis of bone: Secondary | ICD-10-CM | POA: Diagnosis not present

## 2018-08-23 DIAGNOSIS — K922 Gastrointestinal hemorrhage, unspecified: Secondary | ICD-10-CM | POA: Diagnosis not present

## 2018-08-23 DIAGNOSIS — E669 Obesity, unspecified: Secondary | ICD-10-CM | POA: Diagnosis not present

## 2018-08-23 DIAGNOSIS — G629 Polyneuropathy, unspecified: Secondary | ICD-10-CM | POA: Diagnosis not present

## 2018-08-23 DIAGNOSIS — I739 Peripheral vascular disease, unspecified: Secondary | ICD-10-CM | POA: Diagnosis not present

## 2018-08-23 DIAGNOSIS — L97519 Non-pressure chronic ulcer of other part of right foot with unspecified severity: Secondary | ICD-10-CM | POA: Diagnosis not present

## 2018-08-23 DIAGNOSIS — M868X7 Other osteomyelitis, ankle and foot: Secondary | ICD-10-CM | POA: Diagnosis not present

## 2018-08-23 DIAGNOSIS — D500 Iron deficiency anemia secondary to blood loss (chronic): Secondary | ICD-10-CM | POA: Diagnosis not present

## 2018-08-23 DIAGNOSIS — E119 Type 2 diabetes mellitus without complications: Secondary | ICD-10-CM | POA: Diagnosis not present

## 2018-08-23 DIAGNOSIS — E11621 Type 2 diabetes mellitus with foot ulcer: Secondary | ICD-10-CM | POA: Diagnosis not present

## 2018-08-24 DIAGNOSIS — E1140 Type 2 diabetes mellitus with diabetic neuropathy, unspecified: Secondary | ICD-10-CM | POA: Diagnosis not present

## 2018-08-24 DIAGNOSIS — G629 Polyneuropathy, unspecified: Secondary | ICD-10-CM | POA: Diagnosis not present

## 2018-08-24 DIAGNOSIS — M868X7 Other osteomyelitis, ankle and foot: Secondary | ICD-10-CM | POA: Diagnosis not present

## 2018-08-24 DIAGNOSIS — E11621 Type 2 diabetes mellitus with foot ulcer: Secondary | ICD-10-CM | POA: Diagnosis not present

## 2018-08-24 DIAGNOSIS — E1159 Type 2 diabetes mellitus with other circulatory complications: Secondary | ICD-10-CM | POA: Diagnosis not present

## 2018-08-24 DIAGNOSIS — D500 Iron deficiency anemia secondary to blood loss (chronic): Secondary | ICD-10-CM | POA: Diagnosis not present

## 2018-08-24 DIAGNOSIS — E119 Type 2 diabetes mellitus without complications: Secondary | ICD-10-CM | POA: Diagnosis not present

## 2018-08-24 DIAGNOSIS — M159 Polyosteoarthritis, unspecified: Secondary | ICD-10-CM | POA: Diagnosis not present

## 2018-08-24 DIAGNOSIS — S91101A Unspecified open wound of right great toe without damage to nail, initial encounter: Secondary | ICD-10-CM | POA: Diagnosis not present

## 2018-08-24 DIAGNOSIS — K922 Gastrointestinal hemorrhage, unspecified: Secondary | ICD-10-CM | POA: Diagnosis not present

## 2018-08-24 DIAGNOSIS — L97514 Non-pressure chronic ulcer of other part of right foot with necrosis of bone: Secondary | ICD-10-CM | POA: Diagnosis not present

## 2018-08-24 DIAGNOSIS — I739 Peripheral vascular disease, unspecified: Secondary | ICD-10-CM | POA: Diagnosis not present

## 2018-08-24 DIAGNOSIS — L97519 Non-pressure chronic ulcer of other part of right foot with unspecified severity: Secondary | ICD-10-CM | POA: Diagnosis not present

## 2018-08-24 DIAGNOSIS — E669 Obesity, unspecified: Secondary | ICD-10-CM | POA: Diagnosis not present

## 2018-08-28 DIAGNOSIS — Z9889 Other specified postprocedural states: Secondary | ICD-10-CM | POA: Diagnosis not present

## 2018-08-28 DIAGNOSIS — Z89411 Acquired absence of right great toe: Secondary | ICD-10-CM | POA: Diagnosis not present

## 2018-08-28 DIAGNOSIS — Z4781 Encounter for orthopedic aftercare following surgical amputation: Secondary | ICD-10-CM | POA: Diagnosis not present

## 2018-08-31 DIAGNOSIS — Z4781 Encounter for orthopedic aftercare following surgical amputation: Secondary | ICD-10-CM | POA: Diagnosis not present

## 2018-08-31 DIAGNOSIS — Z9889 Other specified postprocedural states: Secondary | ICD-10-CM | POA: Diagnosis not present

## 2018-08-31 DIAGNOSIS — Z89411 Acquired absence of right great toe: Secondary | ICD-10-CM | POA: Diagnosis not present

## 2018-09-03 DIAGNOSIS — Z4781 Encounter for orthopedic aftercare following surgical amputation: Secondary | ICD-10-CM | POA: Diagnosis not present

## 2018-09-03 DIAGNOSIS — Z89411 Acquired absence of right great toe: Secondary | ICD-10-CM | POA: Diagnosis not present

## 2018-09-03 DIAGNOSIS — Z9889 Other specified postprocedural states: Secondary | ICD-10-CM | POA: Diagnosis not present

## 2018-09-04 DIAGNOSIS — E11621 Type 2 diabetes mellitus with foot ulcer: Secondary | ICD-10-CM | POA: Diagnosis not present

## 2018-09-04 DIAGNOSIS — I10 Essential (primary) hypertension: Secondary | ICD-10-CM | POA: Diagnosis not present

## 2018-09-04 DIAGNOSIS — E1151 Type 2 diabetes mellitus with diabetic peripheral angiopathy without gangrene: Secondary | ICD-10-CM | POA: Diagnosis not present

## 2018-09-04 DIAGNOSIS — E1169 Type 2 diabetes mellitus with other specified complication: Secondary | ICD-10-CM | POA: Diagnosis not present

## 2018-09-04 DIAGNOSIS — D649 Anemia, unspecified: Secondary | ICD-10-CM | POA: Diagnosis not present

## 2018-09-04 DIAGNOSIS — L97514 Non-pressure chronic ulcer of other part of right foot with necrosis of bone: Secondary | ICD-10-CM | POA: Diagnosis not present

## 2018-09-04 DIAGNOSIS — F329 Major depressive disorder, single episode, unspecified: Secondary | ICD-10-CM | POA: Diagnosis not present

## 2018-09-04 DIAGNOSIS — M86171 Other acute osteomyelitis, right ankle and foot: Secondary | ICD-10-CM | POA: Diagnosis not present

## 2018-09-04 DIAGNOSIS — E1140 Type 2 diabetes mellitus with diabetic neuropathy, unspecified: Secondary | ICD-10-CM | POA: Diagnosis not present

## 2018-09-07 DIAGNOSIS — I10 Essential (primary) hypertension: Secondary | ICD-10-CM | POA: Diagnosis not present

## 2018-09-07 DIAGNOSIS — M86171 Other acute osteomyelitis, right ankle and foot: Secondary | ICD-10-CM | POA: Diagnosis not present

## 2018-09-07 DIAGNOSIS — D649 Anemia, unspecified: Secondary | ICD-10-CM | POA: Diagnosis not present

## 2018-09-07 DIAGNOSIS — F329 Major depressive disorder, single episode, unspecified: Secondary | ICD-10-CM | POA: Diagnosis not present

## 2018-09-07 DIAGNOSIS — E1140 Type 2 diabetes mellitus with diabetic neuropathy, unspecified: Secondary | ICD-10-CM | POA: Diagnosis not present

## 2018-09-07 DIAGNOSIS — L97514 Non-pressure chronic ulcer of other part of right foot with necrosis of bone: Secondary | ICD-10-CM | POA: Diagnosis not present

## 2018-09-07 DIAGNOSIS — E1169 Type 2 diabetes mellitus with other specified complication: Secondary | ICD-10-CM | POA: Diagnosis not present

## 2018-09-07 DIAGNOSIS — E11621 Type 2 diabetes mellitus with foot ulcer: Secondary | ICD-10-CM | POA: Diagnosis not present

## 2018-09-07 DIAGNOSIS — E1151 Type 2 diabetes mellitus with diabetic peripheral angiopathy without gangrene: Secondary | ICD-10-CM | POA: Diagnosis not present

## 2018-09-08 DIAGNOSIS — E119 Type 2 diabetes mellitus without complications: Secondary | ICD-10-CM | POA: Diagnosis not present

## 2018-09-10 DIAGNOSIS — Z5189 Encounter for other specified aftercare: Secondary | ICD-10-CM | POA: Diagnosis not present

## 2018-09-10 DIAGNOSIS — Z9889 Other specified postprocedural states: Secondary | ICD-10-CM | POA: Diagnosis not present

## 2018-09-11 DIAGNOSIS — E1169 Type 2 diabetes mellitus with other specified complication: Secondary | ICD-10-CM | POA: Diagnosis not present

## 2018-09-11 DIAGNOSIS — I10 Essential (primary) hypertension: Secondary | ICD-10-CM | POA: Diagnosis not present

## 2018-09-11 DIAGNOSIS — E1151 Type 2 diabetes mellitus with diabetic peripheral angiopathy without gangrene: Secondary | ICD-10-CM | POA: Diagnosis not present

## 2018-09-11 DIAGNOSIS — M86171 Other acute osteomyelitis, right ankle and foot: Secondary | ICD-10-CM | POA: Diagnosis not present

## 2018-09-11 DIAGNOSIS — F329 Major depressive disorder, single episode, unspecified: Secondary | ICD-10-CM | POA: Diagnosis not present

## 2018-09-11 DIAGNOSIS — L97514 Non-pressure chronic ulcer of other part of right foot with necrosis of bone: Secondary | ICD-10-CM | POA: Diagnosis not present

## 2018-09-11 DIAGNOSIS — E11621 Type 2 diabetes mellitus with foot ulcer: Secondary | ICD-10-CM | POA: Diagnosis not present

## 2018-09-11 DIAGNOSIS — E1140 Type 2 diabetes mellitus with diabetic neuropathy, unspecified: Secondary | ICD-10-CM | POA: Diagnosis not present

## 2018-09-11 DIAGNOSIS — D649 Anemia, unspecified: Secondary | ICD-10-CM | POA: Diagnosis not present

## 2018-09-13 DIAGNOSIS — I10 Essential (primary) hypertension: Secondary | ICD-10-CM | POA: Diagnosis not present

## 2018-09-13 DIAGNOSIS — E7800 Pure hypercholesterolemia, unspecified: Secondary | ICD-10-CM | POA: Diagnosis not present

## 2018-09-13 DIAGNOSIS — E119 Type 2 diabetes mellitus without complications: Secondary | ICD-10-CM | POA: Diagnosis not present

## 2018-09-14 DIAGNOSIS — E1169 Type 2 diabetes mellitus with other specified complication: Secondary | ICD-10-CM | POA: Diagnosis not present

## 2018-09-14 DIAGNOSIS — M86171 Other acute osteomyelitis, right ankle and foot: Secondary | ICD-10-CM | POA: Diagnosis not present

## 2018-09-14 DIAGNOSIS — L97514 Non-pressure chronic ulcer of other part of right foot with necrosis of bone: Secondary | ICD-10-CM | POA: Diagnosis not present

## 2018-09-14 DIAGNOSIS — E1140 Type 2 diabetes mellitus with diabetic neuropathy, unspecified: Secondary | ICD-10-CM | POA: Diagnosis not present

## 2018-09-14 DIAGNOSIS — E1151 Type 2 diabetes mellitus with diabetic peripheral angiopathy without gangrene: Secondary | ICD-10-CM | POA: Diagnosis not present

## 2018-09-14 DIAGNOSIS — I10 Essential (primary) hypertension: Secondary | ICD-10-CM | POA: Diagnosis not present

## 2018-09-14 DIAGNOSIS — E11621 Type 2 diabetes mellitus with foot ulcer: Secondary | ICD-10-CM | POA: Diagnosis not present

## 2018-09-14 DIAGNOSIS — F329 Major depressive disorder, single episode, unspecified: Secondary | ICD-10-CM | POA: Diagnosis not present

## 2018-09-14 DIAGNOSIS — D649 Anemia, unspecified: Secondary | ICD-10-CM | POA: Diagnosis not present

## 2018-09-17 DIAGNOSIS — Z5189 Encounter for other specified aftercare: Secondary | ICD-10-CM | POA: Diagnosis not present

## 2018-09-17 DIAGNOSIS — Z9889 Other specified postprocedural states: Secondary | ICD-10-CM | POA: Diagnosis not present

## 2018-09-18 DIAGNOSIS — L97514 Non-pressure chronic ulcer of other part of right foot with necrosis of bone: Secondary | ICD-10-CM | POA: Diagnosis not present

## 2018-09-18 DIAGNOSIS — F329 Major depressive disorder, single episode, unspecified: Secondary | ICD-10-CM | POA: Diagnosis not present

## 2018-09-18 DIAGNOSIS — E1140 Type 2 diabetes mellitus with diabetic neuropathy, unspecified: Secondary | ICD-10-CM | POA: Diagnosis not present

## 2018-09-18 DIAGNOSIS — M86171 Other acute osteomyelitis, right ankle and foot: Secondary | ICD-10-CM | POA: Diagnosis not present

## 2018-09-18 DIAGNOSIS — E1169 Type 2 diabetes mellitus with other specified complication: Secondary | ICD-10-CM | POA: Diagnosis not present

## 2018-09-18 DIAGNOSIS — E1151 Type 2 diabetes mellitus with diabetic peripheral angiopathy without gangrene: Secondary | ICD-10-CM | POA: Diagnosis not present

## 2018-09-18 DIAGNOSIS — D649 Anemia, unspecified: Secondary | ICD-10-CM | POA: Diagnosis not present

## 2018-09-18 DIAGNOSIS — E11621 Type 2 diabetes mellitus with foot ulcer: Secondary | ICD-10-CM | POA: Diagnosis not present

## 2018-09-18 DIAGNOSIS — I10 Essential (primary) hypertension: Secondary | ICD-10-CM | POA: Diagnosis not present

## 2018-09-21 DIAGNOSIS — Z9889 Other specified postprocedural states: Secondary | ICD-10-CM | POA: Diagnosis not present

## 2018-09-25 DIAGNOSIS — E1169 Type 2 diabetes mellitus with other specified complication: Secondary | ICD-10-CM | POA: Diagnosis not present

## 2018-09-25 DIAGNOSIS — E1151 Type 2 diabetes mellitus with diabetic peripheral angiopathy without gangrene: Secondary | ICD-10-CM | POA: Diagnosis not present

## 2018-09-25 DIAGNOSIS — D649 Anemia, unspecified: Secondary | ICD-10-CM | POA: Diagnosis not present

## 2018-09-25 DIAGNOSIS — F329 Major depressive disorder, single episode, unspecified: Secondary | ICD-10-CM | POA: Diagnosis not present

## 2018-09-25 DIAGNOSIS — E1140 Type 2 diabetes mellitus with diabetic neuropathy, unspecified: Secondary | ICD-10-CM | POA: Diagnosis not present

## 2018-09-25 DIAGNOSIS — M86171 Other acute osteomyelitis, right ankle and foot: Secondary | ICD-10-CM | POA: Diagnosis not present

## 2018-09-25 DIAGNOSIS — E11621 Type 2 diabetes mellitus with foot ulcer: Secondary | ICD-10-CM | POA: Diagnosis not present

## 2018-09-25 DIAGNOSIS — L97514 Non-pressure chronic ulcer of other part of right foot with necrosis of bone: Secondary | ICD-10-CM | POA: Diagnosis not present

## 2018-09-25 DIAGNOSIS — I10 Essential (primary) hypertension: Secondary | ICD-10-CM | POA: Diagnosis not present

## 2018-09-28 DIAGNOSIS — D649 Anemia, unspecified: Secondary | ICD-10-CM | POA: Diagnosis not present

## 2018-09-28 DIAGNOSIS — E11621 Type 2 diabetes mellitus with foot ulcer: Secondary | ICD-10-CM | POA: Diagnosis not present

## 2018-09-28 DIAGNOSIS — F329 Major depressive disorder, single episode, unspecified: Secondary | ICD-10-CM | POA: Diagnosis not present

## 2018-09-28 DIAGNOSIS — M86171 Other acute osteomyelitis, right ankle and foot: Secondary | ICD-10-CM | POA: Diagnosis not present

## 2018-09-28 DIAGNOSIS — I10 Essential (primary) hypertension: Secondary | ICD-10-CM | POA: Diagnosis not present

## 2018-09-28 DIAGNOSIS — E1140 Type 2 diabetes mellitus with diabetic neuropathy, unspecified: Secondary | ICD-10-CM | POA: Diagnosis not present

## 2018-09-28 DIAGNOSIS — E1169 Type 2 diabetes mellitus with other specified complication: Secondary | ICD-10-CM | POA: Diagnosis not present

## 2018-09-28 DIAGNOSIS — E1151 Type 2 diabetes mellitus with diabetic peripheral angiopathy without gangrene: Secondary | ICD-10-CM | POA: Diagnosis not present

## 2018-09-28 DIAGNOSIS — L97514 Non-pressure chronic ulcer of other part of right foot with necrosis of bone: Secondary | ICD-10-CM | POA: Diagnosis not present

## 2018-10-02 DIAGNOSIS — D649 Anemia, unspecified: Secondary | ICD-10-CM | POA: Diagnosis not present

## 2018-10-02 DIAGNOSIS — L97514 Non-pressure chronic ulcer of other part of right foot with necrosis of bone: Secondary | ICD-10-CM | POA: Diagnosis not present

## 2018-10-02 DIAGNOSIS — F329 Major depressive disorder, single episode, unspecified: Secondary | ICD-10-CM | POA: Diagnosis not present

## 2018-10-02 DIAGNOSIS — E1169 Type 2 diabetes mellitus with other specified complication: Secondary | ICD-10-CM | POA: Diagnosis not present

## 2018-10-02 DIAGNOSIS — E1151 Type 2 diabetes mellitus with diabetic peripheral angiopathy without gangrene: Secondary | ICD-10-CM | POA: Diagnosis not present

## 2018-10-02 DIAGNOSIS — E1140 Type 2 diabetes mellitus with diabetic neuropathy, unspecified: Secondary | ICD-10-CM | POA: Diagnosis not present

## 2018-10-02 DIAGNOSIS — M86171 Other acute osteomyelitis, right ankle and foot: Secondary | ICD-10-CM | POA: Diagnosis not present

## 2018-10-02 DIAGNOSIS — I10 Essential (primary) hypertension: Secondary | ICD-10-CM | POA: Diagnosis not present

## 2018-10-02 DIAGNOSIS — E11621 Type 2 diabetes mellitus with foot ulcer: Secondary | ICD-10-CM | POA: Diagnosis not present

## 2018-10-05 DIAGNOSIS — D649 Anemia, unspecified: Secondary | ICD-10-CM | POA: Diagnosis not present

## 2018-10-05 DIAGNOSIS — E1151 Type 2 diabetes mellitus with diabetic peripheral angiopathy without gangrene: Secondary | ICD-10-CM | POA: Diagnosis not present

## 2018-10-05 DIAGNOSIS — I10 Essential (primary) hypertension: Secondary | ICD-10-CM | POA: Diagnosis not present

## 2018-10-05 DIAGNOSIS — L97514 Non-pressure chronic ulcer of other part of right foot with necrosis of bone: Secondary | ICD-10-CM | POA: Diagnosis not present

## 2018-10-05 DIAGNOSIS — E1140 Type 2 diabetes mellitus with diabetic neuropathy, unspecified: Secondary | ICD-10-CM | POA: Diagnosis not present

## 2018-10-05 DIAGNOSIS — F329 Major depressive disorder, single episode, unspecified: Secondary | ICD-10-CM | POA: Diagnosis not present

## 2018-10-05 DIAGNOSIS — E11621 Type 2 diabetes mellitus with foot ulcer: Secondary | ICD-10-CM | POA: Diagnosis not present

## 2018-10-05 DIAGNOSIS — M86171 Other acute osteomyelitis, right ankle and foot: Secondary | ICD-10-CM | POA: Diagnosis not present

## 2018-10-05 DIAGNOSIS — E1169 Type 2 diabetes mellitus with other specified complication: Secondary | ICD-10-CM | POA: Diagnosis not present

## 2018-10-09 DIAGNOSIS — E1140 Type 2 diabetes mellitus with diabetic neuropathy, unspecified: Secondary | ICD-10-CM | POA: Diagnosis not present

## 2018-10-09 DIAGNOSIS — E1151 Type 2 diabetes mellitus with diabetic peripheral angiopathy without gangrene: Secondary | ICD-10-CM | POA: Diagnosis not present

## 2018-10-09 DIAGNOSIS — D649 Anemia, unspecified: Secondary | ICD-10-CM | POA: Diagnosis not present

## 2018-10-09 DIAGNOSIS — E11621 Type 2 diabetes mellitus with foot ulcer: Secondary | ICD-10-CM | POA: Diagnosis not present

## 2018-10-09 DIAGNOSIS — I10 Essential (primary) hypertension: Secondary | ICD-10-CM | POA: Diagnosis not present

## 2018-10-09 DIAGNOSIS — F329 Major depressive disorder, single episode, unspecified: Secondary | ICD-10-CM | POA: Diagnosis not present

## 2018-10-09 DIAGNOSIS — M86171 Other acute osteomyelitis, right ankle and foot: Secondary | ICD-10-CM | POA: Diagnosis not present

## 2018-10-09 DIAGNOSIS — E1169 Type 2 diabetes mellitus with other specified complication: Secondary | ICD-10-CM | POA: Diagnosis not present

## 2018-10-09 DIAGNOSIS — L97514 Non-pressure chronic ulcer of other part of right foot with necrosis of bone: Secondary | ICD-10-CM | POA: Diagnosis not present

## 2018-10-12 DIAGNOSIS — E1140 Type 2 diabetes mellitus with diabetic neuropathy, unspecified: Secondary | ICD-10-CM | POA: Diagnosis not present

## 2018-10-12 DIAGNOSIS — E11621 Type 2 diabetes mellitus with foot ulcer: Secondary | ICD-10-CM | POA: Diagnosis not present

## 2018-10-12 DIAGNOSIS — I10 Essential (primary) hypertension: Secondary | ICD-10-CM | POA: Diagnosis not present

## 2018-10-12 DIAGNOSIS — E1151 Type 2 diabetes mellitus with diabetic peripheral angiopathy without gangrene: Secondary | ICD-10-CM | POA: Diagnosis not present

## 2018-10-12 DIAGNOSIS — F329 Major depressive disorder, single episode, unspecified: Secondary | ICD-10-CM | POA: Diagnosis not present

## 2018-10-12 DIAGNOSIS — L97514 Non-pressure chronic ulcer of other part of right foot with necrosis of bone: Secondary | ICD-10-CM | POA: Diagnosis not present

## 2018-10-12 DIAGNOSIS — M86171 Other acute osteomyelitis, right ankle and foot: Secondary | ICD-10-CM | POA: Diagnosis not present

## 2018-10-12 DIAGNOSIS — E1169 Type 2 diabetes mellitus with other specified complication: Secondary | ICD-10-CM | POA: Diagnosis not present

## 2018-10-12 DIAGNOSIS — D649 Anemia, unspecified: Secondary | ICD-10-CM | POA: Diagnosis not present

## 2018-10-22 DIAGNOSIS — D649 Anemia, unspecified: Secondary | ICD-10-CM | POA: Diagnosis not present

## 2018-10-22 DIAGNOSIS — E1151 Type 2 diabetes mellitus with diabetic peripheral angiopathy without gangrene: Secondary | ICD-10-CM | POA: Diagnosis not present

## 2018-10-22 DIAGNOSIS — L89890 Pressure ulcer of other site, unstageable: Secondary | ICD-10-CM | POA: Diagnosis not present

## 2018-10-22 DIAGNOSIS — K219 Gastro-esophageal reflux disease without esophagitis: Secondary | ICD-10-CM | POA: Diagnosis not present

## 2018-10-22 DIAGNOSIS — E1140 Type 2 diabetes mellitus with diabetic neuropathy, unspecified: Secondary | ICD-10-CM | POA: Diagnosis not present

## 2018-10-22 DIAGNOSIS — F329 Major depressive disorder, single episode, unspecified: Secondary | ICD-10-CM | POA: Diagnosis not present

## 2018-10-22 DIAGNOSIS — E669 Obesity, unspecified: Secondary | ICD-10-CM | POA: Diagnosis not present

## 2018-10-22 DIAGNOSIS — I10 Essential (primary) hypertension: Secondary | ICD-10-CM | POA: Diagnosis not present

## 2018-10-22 DIAGNOSIS — E785 Hyperlipidemia, unspecified: Secondary | ICD-10-CM | POA: Diagnosis not present

## 2018-10-25 DIAGNOSIS — Z0000 Encounter for general adult medical examination without abnormal findings: Secondary | ICD-10-CM | POA: Diagnosis not present

## 2018-10-25 DIAGNOSIS — Z1211 Encounter for screening for malignant neoplasm of colon: Secondary | ICD-10-CM | POA: Diagnosis not present

## 2018-10-25 DIAGNOSIS — M1990 Unspecified osteoarthritis, unspecified site: Secondary | ICD-10-CM | POA: Diagnosis not present

## 2018-10-25 DIAGNOSIS — Z79899 Other long term (current) drug therapy: Secondary | ICD-10-CM | POA: Diagnosis not present

## 2018-10-25 DIAGNOSIS — E7800 Pure hypercholesterolemia, unspecified: Secondary | ICD-10-CM | POA: Diagnosis not present

## 2018-10-25 DIAGNOSIS — E559 Vitamin D deficiency, unspecified: Secondary | ICD-10-CM | POA: Diagnosis not present

## 2018-10-25 DIAGNOSIS — Z1339 Encounter for screening examination for other mental health and behavioral disorders: Secondary | ICD-10-CM | POA: Diagnosis not present

## 2018-10-25 DIAGNOSIS — E1165 Type 2 diabetes mellitus with hyperglycemia: Secondary | ICD-10-CM | POA: Diagnosis not present

## 2018-10-25 DIAGNOSIS — Z1331 Encounter for screening for depression: Secondary | ICD-10-CM | POA: Diagnosis not present

## 2018-10-25 DIAGNOSIS — Z7189 Other specified counseling: Secondary | ICD-10-CM | POA: Diagnosis not present

## 2018-10-25 DIAGNOSIS — R5383 Other fatigue: Secondary | ICD-10-CM | POA: Diagnosis not present

## 2018-10-29 DIAGNOSIS — F329 Major depressive disorder, single episode, unspecified: Secondary | ICD-10-CM | POA: Diagnosis not present

## 2018-10-29 DIAGNOSIS — D649 Anemia, unspecified: Secondary | ICD-10-CM | POA: Diagnosis not present

## 2018-10-29 DIAGNOSIS — K219 Gastro-esophageal reflux disease without esophagitis: Secondary | ICD-10-CM | POA: Diagnosis not present

## 2018-10-29 DIAGNOSIS — E669 Obesity, unspecified: Secondary | ICD-10-CM | POA: Diagnosis not present

## 2018-10-29 DIAGNOSIS — I10 Essential (primary) hypertension: Secondary | ICD-10-CM | POA: Diagnosis not present

## 2018-10-29 DIAGNOSIS — E1151 Type 2 diabetes mellitus with diabetic peripheral angiopathy without gangrene: Secondary | ICD-10-CM | POA: Diagnosis not present

## 2018-10-29 DIAGNOSIS — E785 Hyperlipidemia, unspecified: Secondary | ICD-10-CM | POA: Diagnosis not present

## 2018-10-29 DIAGNOSIS — L89890 Pressure ulcer of other site, unstageable: Secondary | ICD-10-CM | POA: Diagnosis not present

## 2018-10-29 DIAGNOSIS — E1140 Type 2 diabetes mellitus with diabetic neuropathy, unspecified: Secondary | ICD-10-CM | POA: Diagnosis not present

## 2018-11-01 DIAGNOSIS — E119 Type 2 diabetes mellitus without complications: Secondary | ICD-10-CM | POA: Diagnosis not present

## 2018-11-01 DIAGNOSIS — I10 Essential (primary) hypertension: Secondary | ICD-10-CM | POA: Diagnosis not present

## 2018-11-01 DIAGNOSIS — E7800 Pure hypercholesterolemia, unspecified: Secondary | ICD-10-CM | POA: Diagnosis not present

## 2018-11-05 DIAGNOSIS — E1140 Type 2 diabetes mellitus with diabetic neuropathy, unspecified: Secondary | ICD-10-CM | POA: Diagnosis not present

## 2018-11-05 DIAGNOSIS — K219 Gastro-esophageal reflux disease without esophagitis: Secondary | ICD-10-CM | POA: Diagnosis not present

## 2018-11-05 DIAGNOSIS — E1151 Type 2 diabetes mellitus with diabetic peripheral angiopathy without gangrene: Secondary | ICD-10-CM | POA: Diagnosis not present

## 2018-11-05 DIAGNOSIS — E785 Hyperlipidemia, unspecified: Secondary | ICD-10-CM | POA: Diagnosis not present

## 2018-11-05 DIAGNOSIS — F329 Major depressive disorder, single episode, unspecified: Secondary | ICD-10-CM | POA: Diagnosis not present

## 2018-11-05 DIAGNOSIS — I10 Essential (primary) hypertension: Secondary | ICD-10-CM | POA: Diagnosis not present

## 2018-11-05 DIAGNOSIS — L89890 Pressure ulcer of other site, unstageable: Secondary | ICD-10-CM | POA: Diagnosis not present

## 2018-11-05 DIAGNOSIS — D649 Anemia, unspecified: Secondary | ICD-10-CM | POA: Diagnosis not present

## 2018-11-05 DIAGNOSIS — E669 Obesity, unspecified: Secondary | ICD-10-CM | POA: Diagnosis not present

## 2018-11-09 DIAGNOSIS — T8189XD Other complications of procedures, not elsewhere classified, subsequent encounter: Secondary | ICD-10-CM | POA: Diagnosis not present

## 2018-11-09 DIAGNOSIS — E118 Type 2 diabetes mellitus with unspecified complications: Secondary | ICD-10-CM | POA: Diagnosis not present

## 2018-11-09 DIAGNOSIS — L84 Corns and callosities: Secondary | ICD-10-CM | POA: Diagnosis not present

## 2018-11-12 DIAGNOSIS — F329 Major depressive disorder, single episode, unspecified: Secondary | ICD-10-CM | POA: Diagnosis not present

## 2018-11-12 DIAGNOSIS — L89890 Pressure ulcer of other site, unstageable: Secondary | ICD-10-CM | POA: Diagnosis not present

## 2018-11-12 DIAGNOSIS — E1151 Type 2 diabetes mellitus with diabetic peripheral angiopathy without gangrene: Secondary | ICD-10-CM | POA: Diagnosis not present

## 2018-11-12 DIAGNOSIS — K219 Gastro-esophageal reflux disease without esophagitis: Secondary | ICD-10-CM | POA: Diagnosis not present

## 2018-11-12 DIAGNOSIS — D649 Anemia, unspecified: Secondary | ICD-10-CM | POA: Diagnosis not present

## 2018-11-12 DIAGNOSIS — E669 Obesity, unspecified: Secondary | ICD-10-CM | POA: Diagnosis not present

## 2018-11-12 DIAGNOSIS — E1140 Type 2 diabetes mellitus with diabetic neuropathy, unspecified: Secondary | ICD-10-CM | POA: Diagnosis not present

## 2018-11-12 DIAGNOSIS — E785 Hyperlipidemia, unspecified: Secondary | ICD-10-CM | POA: Diagnosis not present

## 2018-11-12 DIAGNOSIS — I10 Essential (primary) hypertension: Secondary | ICD-10-CM | POA: Diagnosis not present

## 2018-11-19 DIAGNOSIS — E1140 Type 2 diabetes mellitus with diabetic neuropathy, unspecified: Secondary | ICD-10-CM | POA: Diagnosis not present

## 2018-11-19 DIAGNOSIS — D649 Anemia, unspecified: Secondary | ICD-10-CM | POA: Diagnosis not present

## 2018-11-19 DIAGNOSIS — I10 Essential (primary) hypertension: Secondary | ICD-10-CM | POA: Diagnosis not present

## 2018-11-19 DIAGNOSIS — E669 Obesity, unspecified: Secondary | ICD-10-CM | POA: Diagnosis not present

## 2018-11-19 DIAGNOSIS — F329 Major depressive disorder, single episode, unspecified: Secondary | ICD-10-CM | POA: Diagnosis not present

## 2018-11-19 DIAGNOSIS — K219 Gastro-esophageal reflux disease without esophagitis: Secondary | ICD-10-CM | POA: Diagnosis not present

## 2018-11-19 DIAGNOSIS — L89890 Pressure ulcer of other site, unstageable: Secondary | ICD-10-CM | POA: Diagnosis not present

## 2018-11-19 DIAGNOSIS — E1151 Type 2 diabetes mellitus with diabetic peripheral angiopathy without gangrene: Secondary | ICD-10-CM | POA: Diagnosis not present

## 2018-11-19 DIAGNOSIS — E785 Hyperlipidemia, unspecified: Secondary | ICD-10-CM | POA: Diagnosis not present

## 2018-11-26 DIAGNOSIS — F329 Major depressive disorder, single episode, unspecified: Secondary | ICD-10-CM | POA: Diagnosis not present

## 2018-11-26 DIAGNOSIS — E669 Obesity, unspecified: Secondary | ICD-10-CM | POA: Diagnosis not present

## 2018-11-26 DIAGNOSIS — I10 Essential (primary) hypertension: Secondary | ICD-10-CM | POA: Diagnosis not present

## 2018-11-26 DIAGNOSIS — E1140 Type 2 diabetes mellitus with diabetic neuropathy, unspecified: Secondary | ICD-10-CM | POA: Diagnosis not present

## 2018-11-26 DIAGNOSIS — E785 Hyperlipidemia, unspecified: Secondary | ICD-10-CM | POA: Diagnosis not present

## 2018-11-26 DIAGNOSIS — D649 Anemia, unspecified: Secondary | ICD-10-CM | POA: Diagnosis not present

## 2018-11-26 DIAGNOSIS — L89890 Pressure ulcer of other site, unstageable: Secondary | ICD-10-CM | POA: Diagnosis not present

## 2018-11-26 DIAGNOSIS — K219 Gastro-esophageal reflux disease without esophagitis: Secondary | ICD-10-CM | POA: Diagnosis not present

## 2018-11-26 DIAGNOSIS — E1151 Type 2 diabetes mellitus with diabetic peripheral angiopathy without gangrene: Secondary | ICD-10-CM | POA: Diagnosis not present

## 2018-12-03 DIAGNOSIS — E785 Hyperlipidemia, unspecified: Secondary | ICD-10-CM | POA: Diagnosis not present

## 2018-12-03 DIAGNOSIS — F329 Major depressive disorder, single episode, unspecified: Secondary | ICD-10-CM | POA: Diagnosis not present

## 2018-12-03 DIAGNOSIS — L89890 Pressure ulcer of other site, unstageable: Secondary | ICD-10-CM | POA: Diagnosis not present

## 2018-12-03 DIAGNOSIS — D649 Anemia, unspecified: Secondary | ICD-10-CM | POA: Diagnosis not present

## 2018-12-03 DIAGNOSIS — I10 Essential (primary) hypertension: Secondary | ICD-10-CM | POA: Diagnosis not present

## 2018-12-03 DIAGNOSIS — K219 Gastro-esophageal reflux disease without esophagitis: Secondary | ICD-10-CM | POA: Diagnosis not present

## 2018-12-03 DIAGNOSIS — E1140 Type 2 diabetes mellitus with diabetic neuropathy, unspecified: Secondary | ICD-10-CM | POA: Diagnosis not present

## 2018-12-03 DIAGNOSIS — E669 Obesity, unspecified: Secondary | ICD-10-CM | POA: Diagnosis not present

## 2018-12-03 DIAGNOSIS — E1151 Type 2 diabetes mellitus with diabetic peripheral angiopathy without gangrene: Secondary | ICD-10-CM | POA: Diagnosis not present

## 2018-12-08 DIAGNOSIS — E119 Type 2 diabetes mellitus without complications: Secondary | ICD-10-CM | POA: Diagnosis not present

## 2018-12-10 DIAGNOSIS — K219 Gastro-esophageal reflux disease without esophagitis: Secondary | ICD-10-CM | POA: Diagnosis not present

## 2018-12-10 DIAGNOSIS — I10 Essential (primary) hypertension: Secondary | ICD-10-CM | POA: Diagnosis not present

## 2018-12-10 DIAGNOSIS — E1151 Type 2 diabetes mellitus with diabetic peripheral angiopathy without gangrene: Secondary | ICD-10-CM | POA: Diagnosis not present

## 2018-12-10 DIAGNOSIS — E669 Obesity, unspecified: Secondary | ICD-10-CM | POA: Diagnosis not present

## 2018-12-10 DIAGNOSIS — L89890 Pressure ulcer of other site, unstageable: Secondary | ICD-10-CM | POA: Diagnosis not present

## 2018-12-10 DIAGNOSIS — E785 Hyperlipidemia, unspecified: Secondary | ICD-10-CM | POA: Diagnosis not present

## 2018-12-10 DIAGNOSIS — D649 Anemia, unspecified: Secondary | ICD-10-CM | POA: Diagnosis not present

## 2018-12-10 DIAGNOSIS — F329 Major depressive disorder, single episode, unspecified: Secondary | ICD-10-CM | POA: Diagnosis not present

## 2018-12-10 DIAGNOSIS — E1140 Type 2 diabetes mellitus with diabetic neuropathy, unspecified: Secondary | ICD-10-CM | POA: Diagnosis not present

## 2018-12-18 DIAGNOSIS — Z79899 Other long term (current) drug therapy: Secondary | ICD-10-CM | POA: Diagnosis not present

## 2018-12-18 DIAGNOSIS — E119 Type 2 diabetes mellitus without complications: Secondary | ICD-10-CM | POA: Diagnosis not present

## 2018-12-18 DIAGNOSIS — R0781 Pleurodynia: Secondary | ICD-10-CM | POA: Diagnosis not present

## 2018-12-18 DIAGNOSIS — I10 Essential (primary) hypertension: Secondary | ICD-10-CM | POA: Diagnosis not present

## 2018-12-18 DIAGNOSIS — Z90710 Acquired absence of both cervix and uterus: Secondary | ICD-10-CM | POA: Diagnosis not present

## 2018-12-18 DIAGNOSIS — Z87442 Personal history of urinary calculi: Secondary | ICD-10-CM | POA: Diagnosis not present

## 2018-12-18 DIAGNOSIS — J188 Other pneumonia, unspecified organism: Secondary | ICD-10-CM | POA: Diagnosis not present

## 2018-12-18 DIAGNOSIS — Z9049 Acquired absence of other specified parts of digestive tract: Secondary | ICD-10-CM | POA: Diagnosis not present

## 2018-12-19 DIAGNOSIS — E782 Mixed hyperlipidemia: Secondary | ICD-10-CM | POA: Insufficient documentation

## 2018-12-20 DIAGNOSIS — E669 Obesity, unspecified: Secondary | ICD-10-CM | POA: Diagnosis not present

## 2018-12-20 DIAGNOSIS — E785 Hyperlipidemia, unspecified: Secondary | ICD-10-CM | POA: Diagnosis not present

## 2018-12-20 DIAGNOSIS — F329 Major depressive disorder, single episode, unspecified: Secondary | ICD-10-CM | POA: Diagnosis not present

## 2018-12-20 DIAGNOSIS — J189 Pneumonia, unspecified organism: Secondary | ICD-10-CM | POA: Diagnosis not present

## 2018-12-20 DIAGNOSIS — E1140 Type 2 diabetes mellitus with diabetic neuropathy, unspecified: Secondary | ICD-10-CM | POA: Diagnosis not present

## 2018-12-20 DIAGNOSIS — D649 Anemia, unspecified: Secondary | ICD-10-CM | POA: Diagnosis not present

## 2018-12-20 DIAGNOSIS — I10 Essential (primary) hypertension: Secondary | ICD-10-CM | POA: Diagnosis not present

## 2018-12-20 DIAGNOSIS — E1151 Type 2 diabetes mellitus with diabetic peripheral angiopathy without gangrene: Secondary | ICD-10-CM | POA: Diagnosis not present

## 2018-12-20 DIAGNOSIS — L89890 Pressure ulcer of other site, unstageable: Secondary | ICD-10-CM | POA: Diagnosis not present

## 2018-12-27 DIAGNOSIS — F329 Major depressive disorder, single episode, unspecified: Secondary | ICD-10-CM | POA: Diagnosis not present

## 2018-12-27 DIAGNOSIS — I10 Essential (primary) hypertension: Secondary | ICD-10-CM | POA: Diagnosis not present

## 2018-12-27 DIAGNOSIS — E785 Hyperlipidemia, unspecified: Secondary | ICD-10-CM | POA: Diagnosis not present

## 2018-12-27 DIAGNOSIS — J189 Pneumonia, unspecified organism: Secondary | ICD-10-CM | POA: Diagnosis not present

## 2018-12-27 DIAGNOSIS — E1140 Type 2 diabetes mellitus with diabetic neuropathy, unspecified: Secondary | ICD-10-CM | POA: Diagnosis not present

## 2018-12-27 DIAGNOSIS — E669 Obesity, unspecified: Secondary | ICD-10-CM | POA: Diagnosis not present

## 2018-12-27 DIAGNOSIS — E1151 Type 2 diabetes mellitus with diabetic peripheral angiopathy without gangrene: Secondary | ICD-10-CM | POA: Diagnosis not present

## 2018-12-27 DIAGNOSIS — D649 Anemia, unspecified: Secondary | ICD-10-CM | POA: Diagnosis not present

## 2018-12-27 DIAGNOSIS — L89890 Pressure ulcer of other site, unstageable: Secondary | ICD-10-CM | POA: Diagnosis not present

## 2018-12-31 DIAGNOSIS — I10 Essential (primary) hypertension: Secondary | ICD-10-CM | POA: Diagnosis not present

## 2018-12-31 DIAGNOSIS — E7800 Pure hypercholesterolemia, unspecified: Secondary | ICD-10-CM | POA: Diagnosis not present

## 2018-12-31 DIAGNOSIS — E119 Type 2 diabetes mellitus without complications: Secondary | ICD-10-CM | POA: Diagnosis not present

## 2019-01-02 DIAGNOSIS — I10 Essential (primary) hypertension: Secondary | ICD-10-CM | POA: Diagnosis not present

## 2019-01-02 DIAGNOSIS — E785 Hyperlipidemia, unspecified: Secondary | ICD-10-CM | POA: Diagnosis not present

## 2019-01-02 DIAGNOSIS — E1140 Type 2 diabetes mellitus with diabetic neuropathy, unspecified: Secondary | ICD-10-CM | POA: Diagnosis not present

## 2019-01-02 DIAGNOSIS — E669 Obesity, unspecified: Secondary | ICD-10-CM | POA: Diagnosis not present

## 2019-01-02 DIAGNOSIS — D649 Anemia, unspecified: Secondary | ICD-10-CM | POA: Diagnosis not present

## 2019-01-02 DIAGNOSIS — J189 Pneumonia, unspecified organism: Secondary | ICD-10-CM | POA: Diagnosis not present

## 2019-01-02 DIAGNOSIS — L89890 Pressure ulcer of other site, unstageable: Secondary | ICD-10-CM | POA: Diagnosis not present

## 2019-01-02 DIAGNOSIS — E1151 Type 2 diabetes mellitus with diabetic peripheral angiopathy without gangrene: Secondary | ICD-10-CM | POA: Diagnosis not present

## 2019-01-02 DIAGNOSIS — F329 Major depressive disorder, single episode, unspecified: Secondary | ICD-10-CM | POA: Diagnosis not present

## 2019-01-09 DIAGNOSIS — E785 Hyperlipidemia, unspecified: Secondary | ICD-10-CM | POA: Diagnosis not present

## 2019-01-09 DIAGNOSIS — L89890 Pressure ulcer of other site, unstageable: Secondary | ICD-10-CM | POA: Diagnosis not present

## 2019-01-09 DIAGNOSIS — E1140 Type 2 diabetes mellitus with diabetic neuropathy, unspecified: Secondary | ICD-10-CM | POA: Diagnosis not present

## 2019-01-09 DIAGNOSIS — E669 Obesity, unspecified: Secondary | ICD-10-CM | POA: Diagnosis not present

## 2019-01-09 DIAGNOSIS — F329 Major depressive disorder, single episode, unspecified: Secondary | ICD-10-CM | POA: Diagnosis not present

## 2019-01-09 DIAGNOSIS — I10 Essential (primary) hypertension: Secondary | ICD-10-CM | POA: Diagnosis not present

## 2019-01-09 DIAGNOSIS — D649 Anemia, unspecified: Secondary | ICD-10-CM | POA: Diagnosis not present

## 2019-01-09 DIAGNOSIS — J189 Pneumonia, unspecified organism: Secondary | ICD-10-CM | POA: Diagnosis not present

## 2019-01-09 DIAGNOSIS — E1151 Type 2 diabetes mellitus with diabetic peripheral angiopathy without gangrene: Secondary | ICD-10-CM | POA: Diagnosis not present

## 2019-01-17 DIAGNOSIS — L89890 Pressure ulcer of other site, unstageable: Secondary | ICD-10-CM | POA: Diagnosis not present

## 2019-01-17 DIAGNOSIS — E1151 Type 2 diabetes mellitus with diabetic peripheral angiopathy without gangrene: Secondary | ICD-10-CM | POA: Diagnosis not present

## 2019-01-17 DIAGNOSIS — J189 Pneumonia, unspecified organism: Secondary | ICD-10-CM | POA: Diagnosis not present

## 2019-01-17 DIAGNOSIS — I10 Essential (primary) hypertension: Secondary | ICD-10-CM | POA: Diagnosis not present

## 2019-01-17 DIAGNOSIS — D649 Anemia, unspecified: Secondary | ICD-10-CM | POA: Diagnosis not present

## 2019-01-17 DIAGNOSIS — E1140 Type 2 diabetes mellitus with diabetic neuropathy, unspecified: Secondary | ICD-10-CM | POA: Diagnosis not present

## 2019-01-17 DIAGNOSIS — E669 Obesity, unspecified: Secondary | ICD-10-CM | POA: Diagnosis not present

## 2019-01-17 DIAGNOSIS — F329 Major depressive disorder, single episode, unspecified: Secondary | ICD-10-CM | POA: Diagnosis not present

## 2019-01-17 DIAGNOSIS — E785 Hyperlipidemia, unspecified: Secondary | ICD-10-CM | POA: Diagnosis not present

## 2019-01-23 DIAGNOSIS — E669 Obesity, unspecified: Secondary | ICD-10-CM | POA: Diagnosis not present

## 2019-01-23 DIAGNOSIS — L89890 Pressure ulcer of other site, unstageable: Secondary | ICD-10-CM | POA: Diagnosis not present

## 2019-01-23 DIAGNOSIS — F329 Major depressive disorder, single episode, unspecified: Secondary | ICD-10-CM | POA: Diagnosis not present

## 2019-01-23 DIAGNOSIS — E1140 Type 2 diabetes mellitus with diabetic neuropathy, unspecified: Secondary | ICD-10-CM | POA: Diagnosis not present

## 2019-01-23 DIAGNOSIS — E1151 Type 2 diabetes mellitus with diabetic peripheral angiopathy without gangrene: Secondary | ICD-10-CM | POA: Diagnosis not present

## 2019-01-23 DIAGNOSIS — D649 Anemia, unspecified: Secondary | ICD-10-CM | POA: Diagnosis not present

## 2019-01-23 DIAGNOSIS — I10 Essential (primary) hypertension: Secondary | ICD-10-CM | POA: Diagnosis not present

## 2019-01-23 DIAGNOSIS — J189 Pneumonia, unspecified organism: Secondary | ICD-10-CM | POA: Diagnosis not present

## 2019-01-23 DIAGNOSIS — E785 Hyperlipidemia, unspecified: Secondary | ICD-10-CM | POA: Diagnosis not present

## 2019-01-30 DIAGNOSIS — E1151 Type 2 diabetes mellitus with diabetic peripheral angiopathy without gangrene: Secondary | ICD-10-CM | POA: Diagnosis not present

## 2019-01-30 DIAGNOSIS — J189 Pneumonia, unspecified organism: Secondary | ICD-10-CM | POA: Diagnosis not present

## 2019-01-30 DIAGNOSIS — E1140 Type 2 diabetes mellitus with diabetic neuropathy, unspecified: Secondary | ICD-10-CM | POA: Diagnosis not present

## 2019-01-30 DIAGNOSIS — D649 Anemia, unspecified: Secondary | ICD-10-CM | POA: Diagnosis not present

## 2019-01-30 DIAGNOSIS — I10 Essential (primary) hypertension: Secondary | ICD-10-CM | POA: Diagnosis not present

## 2019-01-30 DIAGNOSIS — E785 Hyperlipidemia, unspecified: Secondary | ICD-10-CM | POA: Diagnosis not present

## 2019-01-30 DIAGNOSIS — F329 Major depressive disorder, single episode, unspecified: Secondary | ICD-10-CM | POA: Diagnosis not present

## 2019-01-30 DIAGNOSIS — E669 Obesity, unspecified: Secondary | ICD-10-CM | POA: Diagnosis not present

## 2019-01-30 DIAGNOSIS — L89890 Pressure ulcer of other site, unstageable: Secondary | ICD-10-CM | POA: Diagnosis not present

## 2019-02-07 DIAGNOSIS — E669 Obesity, unspecified: Secondary | ICD-10-CM | POA: Diagnosis not present

## 2019-02-07 DIAGNOSIS — E1140 Type 2 diabetes mellitus with diabetic neuropathy, unspecified: Secondary | ICD-10-CM | POA: Diagnosis not present

## 2019-02-07 DIAGNOSIS — E785 Hyperlipidemia, unspecified: Secondary | ICD-10-CM | POA: Diagnosis not present

## 2019-02-07 DIAGNOSIS — I10 Essential (primary) hypertension: Secondary | ICD-10-CM | POA: Diagnosis not present

## 2019-02-07 DIAGNOSIS — J189 Pneumonia, unspecified organism: Secondary | ICD-10-CM | POA: Diagnosis not present

## 2019-02-07 DIAGNOSIS — F329 Major depressive disorder, single episode, unspecified: Secondary | ICD-10-CM | POA: Diagnosis not present

## 2019-02-07 DIAGNOSIS — L89890 Pressure ulcer of other site, unstageable: Secondary | ICD-10-CM | POA: Diagnosis not present

## 2019-02-07 DIAGNOSIS — E1151 Type 2 diabetes mellitus with diabetic peripheral angiopathy without gangrene: Secondary | ICD-10-CM | POA: Diagnosis not present

## 2019-02-07 DIAGNOSIS — D649 Anemia, unspecified: Secondary | ICD-10-CM | POA: Diagnosis not present

## 2019-02-11 DIAGNOSIS — E7800 Pure hypercholesterolemia, unspecified: Secondary | ICD-10-CM | POA: Diagnosis not present

## 2019-02-11 DIAGNOSIS — I10 Essential (primary) hypertension: Secondary | ICD-10-CM | POA: Diagnosis not present

## 2019-02-11 DIAGNOSIS — E669 Obesity, unspecified: Secondary | ICD-10-CM | POA: Diagnosis not present

## 2019-02-11 DIAGNOSIS — E1140 Type 2 diabetes mellitus with diabetic neuropathy, unspecified: Secondary | ICD-10-CM | POA: Diagnosis not present

## 2019-02-11 DIAGNOSIS — E1151 Type 2 diabetes mellitus with diabetic peripheral angiopathy without gangrene: Secondary | ICD-10-CM | POA: Diagnosis not present

## 2019-02-11 DIAGNOSIS — L89890 Pressure ulcer of other site, unstageable: Secondary | ICD-10-CM | POA: Diagnosis not present

## 2019-02-11 DIAGNOSIS — F329 Major depressive disorder, single episode, unspecified: Secondary | ICD-10-CM | POA: Diagnosis not present

## 2019-02-11 DIAGNOSIS — D649 Anemia, unspecified: Secondary | ICD-10-CM | POA: Diagnosis not present

## 2019-02-11 DIAGNOSIS — E119 Type 2 diabetes mellitus without complications: Secondary | ICD-10-CM | POA: Diagnosis not present

## 2019-02-11 DIAGNOSIS — J189 Pneumonia, unspecified organism: Secondary | ICD-10-CM | POA: Diagnosis not present

## 2019-02-11 DIAGNOSIS — E785 Hyperlipidemia, unspecified: Secondary | ICD-10-CM | POA: Diagnosis not present

## 2019-02-18 DIAGNOSIS — E1151 Type 2 diabetes mellitus with diabetic peripheral angiopathy without gangrene: Secondary | ICD-10-CM | POA: Diagnosis not present

## 2019-02-18 DIAGNOSIS — L89890 Pressure ulcer of other site, unstageable: Secondary | ICD-10-CM | POA: Diagnosis not present

## 2019-02-18 DIAGNOSIS — J189 Pneumonia, unspecified organism: Secondary | ICD-10-CM | POA: Diagnosis not present

## 2019-02-18 DIAGNOSIS — F329 Major depressive disorder, single episode, unspecified: Secondary | ICD-10-CM | POA: Diagnosis not present

## 2019-02-18 DIAGNOSIS — E119 Type 2 diabetes mellitus without complications: Secondary | ICD-10-CM | POA: Diagnosis not present

## 2019-02-18 DIAGNOSIS — E785 Hyperlipidemia, unspecified: Secondary | ICD-10-CM | POA: Diagnosis not present

## 2019-02-18 DIAGNOSIS — D649 Anemia, unspecified: Secondary | ICD-10-CM | POA: Diagnosis not present

## 2019-02-18 DIAGNOSIS — I10 Essential (primary) hypertension: Secondary | ICD-10-CM | POA: Diagnosis not present

## 2019-02-18 DIAGNOSIS — E669 Obesity, unspecified: Secondary | ICD-10-CM | POA: Diagnosis not present

## 2019-02-18 DIAGNOSIS — E1140 Type 2 diabetes mellitus with diabetic neuropathy, unspecified: Secondary | ICD-10-CM | POA: Diagnosis not present

## 2019-03-09 DIAGNOSIS — E119 Type 2 diabetes mellitus without complications: Secondary | ICD-10-CM | POA: Diagnosis not present

## 2019-04-06 DIAGNOSIS — E139 Other specified diabetes mellitus without complications: Secondary | ICD-10-CM | POA: Diagnosis not present

## 2019-04-06 DIAGNOSIS — L0231 Cutaneous abscess of buttock: Secondary | ICD-10-CM | POA: Diagnosis not present

## 2019-04-10 DIAGNOSIS — E7800 Pure hypercholesterolemia, unspecified: Secondary | ICD-10-CM | POA: Diagnosis not present

## 2019-04-10 DIAGNOSIS — I10 Essential (primary) hypertension: Secondary | ICD-10-CM | POA: Diagnosis not present

## 2019-04-10 DIAGNOSIS — E119 Type 2 diabetes mellitus without complications: Secondary | ICD-10-CM | POA: Diagnosis not present

## 2019-04-18 DIAGNOSIS — E782 Mixed hyperlipidemia: Secondary | ICD-10-CM | POA: Diagnosis not present

## 2019-04-18 DIAGNOSIS — G629 Polyneuropathy, unspecified: Secondary | ICD-10-CM | POA: Diagnosis not present

## 2019-04-18 DIAGNOSIS — D649 Anemia, unspecified: Secondary | ICD-10-CM | POA: Diagnosis not present

## 2019-04-18 DIAGNOSIS — Z202 Contact with and (suspected) exposure to infections with a predominantly sexual mode of transmission: Secondary | ICD-10-CM | POA: Diagnosis not present

## 2019-04-18 DIAGNOSIS — F411 Generalized anxiety disorder: Secondary | ICD-10-CM | POA: Diagnosis not present

## 2019-04-18 DIAGNOSIS — R9431 Abnormal electrocardiogram [ECG] [EKG]: Secondary | ICD-10-CM | POA: Diagnosis not present

## 2019-04-18 DIAGNOSIS — Z1231 Encounter for screening mammogram for malignant neoplasm of breast: Secondary | ICD-10-CM | POA: Diagnosis not present

## 2019-04-18 DIAGNOSIS — R0609 Other forms of dyspnea: Secondary | ICD-10-CM | POA: Diagnosis not present

## 2019-04-18 DIAGNOSIS — L98499 Non-pressure chronic ulcer of skin of other sites with unspecified severity: Secondary | ICD-10-CM | POA: Diagnosis not present

## 2019-04-18 DIAGNOSIS — M1990 Unspecified osteoarthritis, unspecified site: Secondary | ICD-10-CM | POA: Insufficient documentation

## 2019-04-18 DIAGNOSIS — K7460 Unspecified cirrhosis of liver: Secondary | ICD-10-CM | POA: Diagnosis not present

## 2019-04-18 DIAGNOSIS — F32A Depression, unspecified: Secondary | ICD-10-CM | POA: Insufficient documentation

## 2019-04-18 DIAGNOSIS — Z0000 Encounter for general adult medical examination without abnormal findings: Secondary | ICD-10-CM | POA: Diagnosis not present

## 2019-04-18 DIAGNOSIS — I10 Essential (primary) hypertension: Secondary | ICD-10-CM | POA: Insufficient documentation

## 2019-04-18 DIAGNOSIS — E119 Type 2 diabetes mellitus without complications: Secondary | ICD-10-CM | POA: Diagnosis not present

## 2019-04-18 DIAGNOSIS — L282 Other prurigo: Secondary | ICD-10-CM | POA: Diagnosis not present

## 2019-04-18 DIAGNOSIS — J302 Other seasonal allergic rhinitis: Secondary | ICD-10-CM | POA: Insufficient documentation

## 2019-04-23 ENCOUNTER — Other Ambulatory Visit: Payer: Self-pay

## 2019-04-23 DIAGNOSIS — D61818 Other pancytopenia: Secondary | ICD-10-CM | POA: Diagnosis not present

## 2019-04-23 DIAGNOSIS — K7460 Unspecified cirrhosis of liver: Secondary | ICD-10-CM | POA: Diagnosis not present

## 2019-04-23 DIAGNOSIS — I110 Hypertensive heart disease with heart failure: Secondary | ICD-10-CM | POA: Diagnosis not present

## 2019-04-23 DIAGNOSIS — I10 Essential (primary) hypertension: Secondary | ICD-10-CM | POA: Diagnosis not present

## 2019-04-23 DIAGNOSIS — R4182 Altered mental status, unspecified: Secondary | ICD-10-CM | POA: Diagnosis not present

## 2019-04-23 DIAGNOSIS — I5031 Acute diastolic (congestive) heart failure: Secondary | ICD-10-CM | POA: Diagnosis not present

## 2019-04-23 DIAGNOSIS — N179 Acute kidney failure, unspecified: Secondary | ICD-10-CM | POA: Diagnosis not present

## 2019-04-23 DIAGNOSIS — R9431 Abnormal electrocardiogram [ECG] [EKG]: Secondary | ICD-10-CM | POA: Diagnosis not present

## 2019-04-23 DIAGNOSIS — I5022 Chronic systolic (congestive) heart failure: Secondary | ICD-10-CM | POA: Diagnosis not present

## 2019-04-23 DIAGNOSIS — I509 Heart failure, unspecified: Secondary | ICD-10-CM | POA: Diagnosis not present

## 2019-04-23 DIAGNOSIS — E1140 Type 2 diabetes mellitus with diabetic neuropathy, unspecified: Secondary | ICD-10-CM | POA: Diagnosis not present

## 2019-04-23 DIAGNOSIS — D509 Iron deficiency anemia, unspecified: Secondary | ICD-10-CM | POA: Diagnosis not present

## 2019-04-23 DIAGNOSIS — D696 Thrombocytopenia, unspecified: Secondary | ICD-10-CM | POA: Diagnosis not present

## 2019-04-23 DIAGNOSIS — R0789 Other chest pain: Secondary | ICD-10-CM | POA: Diagnosis not present

## 2019-04-23 DIAGNOSIS — Z794 Long term (current) use of insulin: Secondary | ICD-10-CM | POA: Diagnosis not present

## 2019-04-23 DIAGNOSIS — R600 Localized edema: Secondary | ICD-10-CM | POA: Diagnosis not present

## 2019-04-23 NOTE — Patient Outreach (Signed)
[facility]) Care Management  04/23/2019  Ariel Arnold [birth year] [id number]   Referral Date: 04/22/2019 Referral Source: [facility] Referral Referral Reason: CM-DM, HTN, HDL, Depression     Outreach Attempt: no answer.  HIPAA compliant voice message left.   Plan: RN CM will attempt patient again within 4 business days and send letter.     Bary Leriche, RN, MSN [facility] Care Management Care Management Coordinator Direct Line [phone] Toll Free: [phone]  Fax: [phone]

## 2019-04-24 DIAGNOSIS — I509 Heart failure, unspecified: Secondary | ICD-10-CM | POA: Diagnosis not present

## 2019-04-24 DIAGNOSIS — I5031 Acute diastolic (congestive) heart failure: Secondary | ICD-10-CM | POA: Diagnosis not present

## 2019-04-26 ENCOUNTER — Other Ambulatory Visit: Payer: Self-pay

## 2019-04-26 NOTE — Patient Outreach (Signed)
[facility]) Care Management  04/26/2019  Ariel Arnold Jun 14, 1956 [id number]   Referral Date: 04/22/2019 Referral Source: [facility] Referral Referral Reason: CM-DM, HTN, HDL, Depression                           Outreach Attempt: no answer.  HIPAA compliant voice message left.   Plan: RN CM will attempt patient again within 4 business days.  Bary Leriche, RN, MSN [facility] Care Management Care Management Coordinator Direct Line [phone] Cell [phone] Toll Free: [phone]  Fax: [phone]

## 2019-05-01 ENCOUNTER — Other Ambulatory Visit: Payer: Self-pay

## 2019-05-01 NOTE — Patient Outreach (Signed)
[facility]) Care Management  05/01/2019  NAVY DESAUTELS 17-Dec-1956 [id number]   Referral Date:04/22/2019 Referral Source:[facility] Referral Referral Reason:CM-DM, HTN, HDL, Depression   Outreach Attempt:no answer. HIPAA compliant voice message left.   Plan:RN CM will wait return call.  If no return call will close case.   Bary Leriche, RN, MSN [facility] Care Management Care Management Coordinator Direct Line [phone] Cell [phone] Toll Free: [phone]  Fax: [phone]

## 2019-05-02 DIAGNOSIS — D649 Anemia, unspecified: Secondary | ICD-10-CM | POA: Diagnosis not present

## 2019-05-02 DIAGNOSIS — E559 Vitamin D deficiency, unspecified: Secondary | ICD-10-CM | POA: Diagnosis not present

## 2019-05-02 DIAGNOSIS — K7460 Unspecified cirrhosis of liver: Secondary | ICD-10-CM | POA: Diagnosis not present

## 2019-05-02 DIAGNOSIS — G629 Polyneuropathy, unspecified: Secondary | ICD-10-CM | POA: Diagnosis not present

## 2019-05-02 DIAGNOSIS — Z79899 Other long term (current) drug therapy: Secondary | ICD-10-CM | POA: Diagnosis not present

## 2019-05-02 DIAGNOSIS — E119 Type 2 diabetes mellitus without complications: Secondary | ICD-10-CM | POA: Diagnosis not present

## 2019-05-02 DIAGNOSIS — R413 Other amnesia: Secondary | ICD-10-CM | POA: Diagnosis not present

## 2019-05-02 DIAGNOSIS — R0600 Dyspnea, unspecified: Secondary | ICD-10-CM | POA: Diagnosis not present

## 2019-05-06 DIAGNOSIS — E119 Type 2 diabetes mellitus without complications: Secondary | ICD-10-CM | POA: Insufficient documentation

## 2019-05-07 ENCOUNTER — Other Ambulatory Visit: Payer: Self-pay

## 2019-05-07 DIAGNOSIS — D649 Anemia, unspecified: Secondary | ICD-10-CM | POA: Diagnosis not present

## 2019-05-07 DIAGNOSIS — E119 Type 2 diabetes mellitus without complications: Secondary | ICD-10-CM | POA: Diagnosis not present

## 2019-05-07 DIAGNOSIS — K760 Fatty (change of) liver, not elsewhere classified: Secondary | ICD-10-CM | POA: Insufficient documentation

## 2019-05-07 DIAGNOSIS — E785 Hyperlipidemia, unspecified: Secondary | ICD-10-CM | POA: Diagnosis not present

## 2019-05-07 DIAGNOSIS — R0609 Other forms of dyspnea: Secondary | ICD-10-CM | POA: Insufficient documentation

## 2019-05-07 DIAGNOSIS — I10 Essential (primary) hypertension: Secondary | ICD-10-CM | POA: Diagnosis not present

## 2019-05-07 NOTE — Patient Outreach (Signed)
[facility]) Care Management  05/07/2019  ILIANI VONWALD 06-Mar-1956 [id number]   Multiple attempts to establish contact with patient without success. No response from letter mailed to patient.   Plan: RN CM will close case at this time.   Bary Leriche, RN, MSN [facility] Care Management Care Management Coordinator Direct Line [phone] Cell [phone] Toll Free: [phone]  Fax: [phone]

## 2019-05-16 DIAGNOSIS — G4719 Other hypersomnia: Secondary | ICD-10-CM | POA: Diagnosis not present

## 2019-05-16 DIAGNOSIS — N183 Chronic kidney disease, stage 3 (moderate): Secondary | ICD-10-CM | POA: Diagnosis not present

## 2019-05-16 DIAGNOSIS — I10 Essential (primary) hypertension: Secondary | ICD-10-CM | POA: Diagnosis not present

## 2019-05-16 DIAGNOSIS — R413 Other amnesia: Secondary | ICD-10-CM | POA: Diagnosis not present

## 2019-05-16 DIAGNOSIS — R0789 Other chest pain: Secondary | ICD-10-CM | POA: Diagnosis not present

## 2019-05-16 DIAGNOSIS — R0609 Other forms of dyspnea: Secondary | ICD-10-CM | POA: Diagnosis not present

## 2019-05-16 DIAGNOSIS — E119 Type 2 diabetes mellitus without complications: Secondary | ICD-10-CM | POA: Diagnosis not present

## 2019-05-16 DIAGNOSIS — E559 Vitamin D deficiency, unspecified: Secondary | ICD-10-CM | POA: Diagnosis not present

## 2019-05-28 DIAGNOSIS — R161 Splenomegaly, not elsewhere classified: Secondary | ICD-10-CM | POA: Diagnosis not present

## 2019-05-28 DIAGNOSIS — D61818 Other pancytopenia: Secondary | ICD-10-CM | POA: Diagnosis not present

## 2019-05-28 DIAGNOSIS — R160 Hepatomegaly, not elsewhere classified: Secondary | ICD-10-CM | POA: Diagnosis not present

## 2019-05-30 DIAGNOSIS — G4733 Obstructive sleep apnea (adult) (pediatric): Secondary | ICD-10-CM | POA: Diagnosis not present

## 2019-06-08 DIAGNOSIS — E119 Type 2 diabetes mellitus without complications: Secondary | ICD-10-CM | POA: Diagnosis not present

## 2019-06-13 DIAGNOSIS — R161 Splenomegaly, not elsewhere classified: Secondary | ICD-10-CM | POA: Diagnosis not present

## 2019-06-13 DIAGNOSIS — R160 Hepatomegaly, not elsewhere classified: Secondary | ICD-10-CM | POA: Diagnosis not present

## 2019-06-17 DIAGNOSIS — K7460 Unspecified cirrhosis of liver: Secondary | ICD-10-CM | POA: Diagnosis not present

## 2019-06-17 DIAGNOSIS — E119 Type 2 diabetes mellitus without complications: Secondary | ICD-10-CM | POA: Diagnosis not present

## 2019-06-17 DIAGNOSIS — N182 Chronic kidney disease, stage 2 (mild): Secondary | ICD-10-CM | POA: Diagnosis not present

## 2019-06-17 DIAGNOSIS — R413 Other amnesia: Secondary | ICD-10-CM | POA: Diagnosis not present

## 2019-06-17 DIAGNOSIS — D649 Anemia, unspecified: Secondary | ICD-10-CM | POA: Diagnosis not present

## 2019-06-17 DIAGNOSIS — I10 Essential (primary) hypertension: Secondary | ICD-10-CM | POA: Diagnosis not present

## 2019-06-18 DIAGNOSIS — E785 Hyperlipidemia, unspecified: Secondary | ICD-10-CM | POA: Diagnosis not present

## 2019-06-18 DIAGNOSIS — R079 Chest pain, unspecified: Secondary | ICD-10-CM | POA: Insufficient documentation

## 2019-06-18 DIAGNOSIS — I10 Essential (primary) hypertension: Secondary | ICD-10-CM | POA: Diagnosis not present

## 2019-06-18 DIAGNOSIS — E119 Type 2 diabetes mellitus without complications: Secondary | ICD-10-CM | POA: Diagnosis not present

## 2019-06-18 DIAGNOSIS — R0609 Other forms of dyspnea: Secondary | ICD-10-CM | POA: Diagnosis not present

## 2019-06-19 DIAGNOSIS — R079 Chest pain, unspecified: Secondary | ICD-10-CM | POA: Diagnosis not present

## 2019-06-20 DIAGNOSIS — D509 Iron deficiency anemia, unspecified: Secondary | ICD-10-CM | POA: Diagnosis not present

## 2019-06-20 DIAGNOSIS — I10 Essential (primary) hypertension: Secondary | ICD-10-CM | POA: Diagnosis not present

## 2019-06-20 DIAGNOSIS — E559 Vitamin D deficiency, unspecified: Secondary | ICD-10-CM | POA: Diagnosis not present

## 2019-07-15 DIAGNOSIS — L03116 Cellulitis of left lower limb: Secondary | ICD-10-CM | POA: Diagnosis not present

## 2019-07-15 DIAGNOSIS — Z9049 Acquired absence of other specified parts of digestive tract: Secondary | ICD-10-CM | POA: Diagnosis not present

## 2019-07-15 DIAGNOSIS — I739 Peripheral vascular disease, unspecified: Secondary | ICD-10-CM | POA: Diagnosis not present

## 2019-07-15 DIAGNOSIS — R5383 Other fatigue: Secondary | ICD-10-CM | POA: Diagnosis not present

## 2019-07-15 DIAGNOSIS — E119 Type 2 diabetes mellitus without complications: Secondary | ICD-10-CM | POA: Diagnosis not present

## 2019-07-15 DIAGNOSIS — J018 Other acute sinusitis: Secondary | ICD-10-CM | POA: Diagnosis not present

## 2019-07-15 DIAGNOSIS — N390 Urinary tract infection, site not specified: Secondary | ICD-10-CM | POA: Diagnosis not present

## 2019-07-15 DIAGNOSIS — Z9089 Acquired absence of other organs: Secondary | ICD-10-CM | POA: Diagnosis not present

## 2019-07-21 DIAGNOSIS — Z792 Long term (current) use of antibiotics: Secondary | ICD-10-CM | POA: Diagnosis not present

## 2019-07-21 DIAGNOSIS — L97529 Non-pressure chronic ulcer of other part of left foot with unspecified severity: Secondary | ICD-10-CM | POA: Diagnosis not present

## 2019-07-21 DIAGNOSIS — I96 Gangrene, not elsewhere classified: Secondary | ICD-10-CM | POA: Diagnosis not present

## 2019-07-21 DIAGNOSIS — E871 Hypo-osmolality and hyponatremia: Secondary | ICD-10-CM | POA: Diagnosis not present

## 2019-07-21 DIAGNOSIS — M159 Polyosteoarthritis, unspecified: Secondary | ICD-10-CM | POA: Diagnosis not present

## 2019-07-21 DIAGNOSIS — R7881 Bacteremia: Secondary | ICD-10-CM | POA: Diagnosis not present

## 2019-07-21 DIAGNOSIS — L03116 Cellulitis of left lower limb: Secondary | ICD-10-CM | POA: Diagnosis not present

## 2019-07-21 DIAGNOSIS — E1152 Type 2 diabetes mellitus with diabetic peripheral angiopathy with gangrene: Secondary | ICD-10-CM | POA: Diagnosis not present

## 2019-07-21 DIAGNOSIS — R509 Fever, unspecified: Secondary | ICD-10-CM | POA: Diagnosis not present

## 2019-07-21 DIAGNOSIS — R4182 Altered mental status, unspecified: Secondary | ICD-10-CM | POA: Diagnosis not present

## 2019-07-21 DIAGNOSIS — I10 Essential (primary) hypertension: Secondary | ICD-10-CM | POA: Diagnosis not present

## 2019-07-21 DIAGNOSIS — K219 Gastro-esophageal reflux disease without esophagitis: Secondary | ICD-10-CM | POA: Diagnosis not present

## 2019-07-21 DIAGNOSIS — E1140 Type 2 diabetes mellitus with diabetic neuropathy, unspecified: Secondary | ICD-10-CM | POA: Diagnosis not present

## 2019-07-21 DIAGNOSIS — A419 Sepsis, unspecified organism: Secondary | ICD-10-CM | POA: Diagnosis not present

## 2019-07-21 DIAGNOSIS — E876 Hypokalemia: Secondary | ICD-10-CM | POA: Diagnosis not present

## 2019-07-21 DIAGNOSIS — E11621 Type 2 diabetes mellitus with foot ulcer: Secondary | ICD-10-CM | POA: Diagnosis not present

## 2019-07-21 DIAGNOSIS — M86472 Chronic osteomyelitis with draining sinus, left ankle and foot: Secondary | ICD-10-CM | POA: Diagnosis not present

## 2019-07-26 DIAGNOSIS — D649 Anemia, unspecified: Secondary | ICD-10-CM | POA: Diagnosis not present

## 2019-07-26 DIAGNOSIS — E871 Hypo-osmolality and hyponatremia: Secondary | ICD-10-CM | POA: Diagnosis not present

## 2019-07-26 DIAGNOSIS — E1151 Type 2 diabetes mellitus with diabetic peripheral angiopathy without gangrene: Secondary | ICD-10-CM | POA: Diagnosis not present

## 2019-07-26 DIAGNOSIS — K219 Gastro-esophageal reflux disease without esophagitis: Secondary | ICD-10-CM | POA: Diagnosis not present

## 2019-07-26 DIAGNOSIS — E1142 Type 2 diabetes mellitus with diabetic polyneuropathy: Secondary | ICD-10-CM | POA: Diagnosis not present

## 2019-07-26 DIAGNOSIS — F329 Major depressive disorder, single episode, unspecified: Secondary | ICD-10-CM | POA: Diagnosis not present

## 2019-07-26 DIAGNOSIS — I10 Essential (primary) hypertension: Secondary | ICD-10-CM | POA: Diagnosis not present

## 2019-07-26 DIAGNOSIS — Z4781 Encounter for orthopedic aftercare following surgical amputation: Secondary | ICD-10-CM | POA: Diagnosis not present

## 2019-07-26 DIAGNOSIS — L03116 Cellulitis of left lower limb: Secondary | ICD-10-CM | POA: Diagnosis not present

## 2019-07-31 DIAGNOSIS — F329 Major depressive disorder, single episode, unspecified: Secondary | ICD-10-CM | POA: Diagnosis not present

## 2019-07-31 DIAGNOSIS — E871 Hypo-osmolality and hyponatremia: Secondary | ICD-10-CM | POA: Diagnosis not present

## 2019-07-31 DIAGNOSIS — D649 Anemia, unspecified: Secondary | ICD-10-CM | POA: Diagnosis not present

## 2019-07-31 DIAGNOSIS — Z4781 Encounter for orthopedic aftercare following surgical amputation: Secondary | ICD-10-CM | POA: Diagnosis not present

## 2019-07-31 DIAGNOSIS — K219 Gastro-esophageal reflux disease without esophagitis: Secondary | ICD-10-CM | POA: Diagnosis not present

## 2019-07-31 DIAGNOSIS — E1151 Type 2 diabetes mellitus with diabetic peripheral angiopathy without gangrene: Secondary | ICD-10-CM | POA: Diagnosis not present

## 2019-07-31 DIAGNOSIS — E1142 Type 2 diabetes mellitus with diabetic polyneuropathy: Secondary | ICD-10-CM | POA: Diagnosis not present

## 2019-07-31 DIAGNOSIS — I10 Essential (primary) hypertension: Secondary | ICD-10-CM | POA: Diagnosis not present

## 2019-07-31 DIAGNOSIS — L03116 Cellulitis of left lower limb: Secondary | ICD-10-CM | POA: Diagnosis not present

## 2019-08-01 DIAGNOSIS — Z794 Long term (current) use of insulin: Secondary | ICD-10-CM | POA: Diagnosis not present

## 2019-08-01 DIAGNOSIS — K7460 Unspecified cirrhosis of liver: Secondary | ICD-10-CM | POA: Diagnosis not present

## 2019-08-01 DIAGNOSIS — Z89422 Acquired absence of other left toe(s): Secondary | ICD-10-CM | POA: Diagnosis not present

## 2019-08-01 DIAGNOSIS — N182 Chronic kidney disease, stage 2 (mild): Secondary | ICD-10-CM | POA: Insufficient documentation

## 2019-08-01 DIAGNOSIS — E1122 Type 2 diabetes mellitus with diabetic chronic kidney disease: Secondary | ICD-10-CM | POA: Insufficient documentation

## 2019-08-01 DIAGNOSIS — I10 Essential (primary) hypertension: Secondary | ICD-10-CM | POA: Diagnosis not present

## 2019-08-01 DIAGNOSIS — E13621 Other specified diabetes mellitus with foot ulcer: Secondary | ICD-10-CM | POA: Diagnosis not present

## 2019-08-01 DIAGNOSIS — E782 Mixed hyperlipidemia: Secondary | ICD-10-CM | POA: Diagnosis not present

## 2019-08-02 DIAGNOSIS — R52 Pain, unspecified: Secondary | ICD-10-CM | POA: Diagnosis not present

## 2019-08-02 DIAGNOSIS — D649 Anemia, unspecified: Secondary | ICD-10-CM | POA: Diagnosis not present

## 2019-08-02 DIAGNOSIS — E1140 Type 2 diabetes mellitus with diabetic neuropathy, unspecified: Secondary | ICD-10-CM | POA: Diagnosis not present

## 2019-08-02 DIAGNOSIS — E1169 Type 2 diabetes mellitus with other specified complication: Secondary | ICD-10-CM | POA: Diagnosis not present

## 2019-08-02 DIAGNOSIS — L03116 Cellulitis of left lower limb: Secondary | ICD-10-CM | POA: Diagnosis not present

## 2019-08-02 DIAGNOSIS — M86172 Other acute osteomyelitis, left ankle and foot: Secondary | ICD-10-CM | POA: Diagnosis not present

## 2019-08-02 DIAGNOSIS — I739 Peripheral vascular disease, unspecified: Secondary | ICD-10-CM | POA: Diagnosis not present

## 2019-08-02 DIAGNOSIS — Z01810 Encounter for preprocedural cardiovascular examination: Secondary | ICD-10-CM | POA: Diagnosis not present

## 2019-08-02 DIAGNOSIS — M869 Osteomyelitis, unspecified: Secondary | ICD-10-CM | POA: Diagnosis not present

## 2019-08-02 DIAGNOSIS — R509 Fever, unspecified: Secondary | ICD-10-CM | POA: Diagnosis not present

## 2019-08-02 DIAGNOSIS — E669 Obesity, unspecified: Secondary | ICD-10-CM | POA: Diagnosis not present

## 2019-08-02 DIAGNOSIS — K922 Gastrointestinal hemorrhage, unspecified: Secondary | ICD-10-CM | POA: Diagnosis not present

## 2019-08-08 DIAGNOSIS — K219 Gastro-esophageal reflux disease without esophagitis: Secondary | ICD-10-CM | POA: Insufficient documentation

## 2019-08-08 DIAGNOSIS — E1140 Type 2 diabetes mellitus with diabetic neuropathy, unspecified: Secondary | ICD-10-CM | POA: Insufficient documentation

## 2019-08-08 DIAGNOSIS — Z794 Long term (current) use of insulin: Secondary | ICD-10-CM | POA: Insufficient documentation

## 2019-08-08 DIAGNOSIS — F329 Major depressive disorder, single episode, unspecified: Secondary | ICD-10-CM | POA: Insufficient documentation

## 2019-08-08 DIAGNOSIS — I739 Peripheral vascular disease, unspecified: Secondary | ICD-10-CM | POA: Insufficient documentation

## 2019-08-08 DIAGNOSIS — Z87442 Personal history of urinary calculi: Secondary | ICD-10-CM | POA: Insufficient documentation

## 2019-08-09 DIAGNOSIS — M25572 Pain in left ankle and joints of left foot: Secondary | ICD-10-CM | POA: Insufficient documentation

## 2019-08-09 DIAGNOSIS — Z8711 Personal history of peptic ulcer disease: Secondary | ICD-10-CM | POA: Insufficient documentation

## 2019-08-09 DIAGNOSIS — L03119 Cellulitis of unspecified part of limb: Secondary | ICD-10-CM | POA: Diagnosis not present

## 2019-08-09 DIAGNOSIS — E1140 Type 2 diabetes mellitus with diabetic neuropathy, unspecified: Secondary | ICD-10-CM | POA: Diagnosis not present

## 2019-08-09 DIAGNOSIS — M86172 Other acute osteomyelitis, left ankle and foot: Secondary | ICD-10-CM | POA: Diagnosis not present

## 2019-08-09 DIAGNOSIS — L03116 Cellulitis of left lower limb: Secondary | ICD-10-CM | POA: Diagnosis not present

## 2019-08-09 DIAGNOSIS — E119 Type 2 diabetes mellitus without complications: Secondary | ICD-10-CM | POA: Diagnosis not present

## 2019-08-09 DIAGNOSIS — U071 COVID-19: Secondary | ICD-10-CM | POA: Diagnosis not present

## 2019-08-09 DIAGNOSIS — I10 Essential (primary) hypertension: Secondary | ICD-10-CM | POA: Diagnosis not present

## 2019-08-09 DIAGNOSIS — D500 Iron deficiency anemia secondary to blood loss (chronic): Secondary | ICD-10-CM | POA: Diagnosis not present

## 2019-08-09 DIAGNOSIS — Z794 Long term (current) use of insulin: Secondary | ICD-10-CM | POA: Diagnosis not present

## 2019-08-09 DIAGNOSIS — R2681 Unsteadiness on feet: Secondary | ICD-10-CM | POA: Insufficient documentation

## 2019-08-09 DIAGNOSIS — M6281 Muscle weakness (generalized): Secondary | ICD-10-CM | POA: Insufficient documentation

## 2019-08-09 DIAGNOSIS — E668 Other obesity: Secondary | ICD-10-CM | POA: Diagnosis not present

## 2019-08-09 DIAGNOSIS — R262 Difficulty in walking, not elsewhere classified: Secondary | ICD-10-CM | POA: Insufficient documentation

## 2019-08-09 DIAGNOSIS — Z20828 Contact with and (suspected) exposure to other viral communicable diseases: Secondary | ICD-10-CM | POA: Diagnosis not present

## 2019-08-09 DIAGNOSIS — I739 Peripheral vascular disease, unspecified: Secondary | ICD-10-CM | POA: Diagnosis not present

## 2019-08-09 DIAGNOSIS — E785 Hyperlipidemia, unspecified: Secondary | ICD-10-CM | POA: Diagnosis not present

## 2019-08-09 DIAGNOSIS — M869 Osteomyelitis, unspecified: Secondary | ICD-10-CM | POA: Diagnosis not present

## 2019-08-20 DIAGNOSIS — U071 COVID-19: Secondary | ICD-10-CM | POA: Insufficient documentation

## 2019-11-19 DIAGNOSIS — Z89422 Acquired absence of other left toe(s): Secondary | ICD-10-CM | POA: Insufficient documentation

## 2019-11-25 DIAGNOSIS — Z20828 Contact with and (suspected) exposure to other viral communicable diseases: Secondary | ICD-10-CM | POA: Diagnosis not present

## 2019-12-20 DIAGNOSIS — D631 Anemia in chronic kidney disease: Secondary | ICD-10-CM | POA: Insufficient documentation

## 2019-12-24 DIAGNOSIS — L97512 Non-pressure chronic ulcer of other part of right foot with fat layer exposed: Secondary | ICD-10-CM | POA: Insufficient documentation

## 2019-12-25 DIAGNOSIS — R600 Localized edema: Secondary | ICD-10-CM | POA: Insufficient documentation

## 2020-07-16 DIAGNOSIS — R531 Weakness: Secondary | ICD-10-CM | POA: Insufficient documentation

## 2020-07-16 DIAGNOSIS — K921 Melena: Secondary | ICD-10-CM | POA: Insufficient documentation

## 2020-07-16 DIAGNOSIS — K529 Noninfective gastroenteritis and colitis, unspecified: Secondary | ICD-10-CM | POA: Insufficient documentation

## 2020-08-04 DIAGNOSIS — I2510 Atherosclerotic heart disease of native coronary artery without angina pectoris: Secondary | ICD-10-CM | POA: Insufficient documentation

## 2020-09-09 DIAGNOSIS — T8789 Other complications of amputation stump: Secondary | ICD-10-CM | POA: Insufficient documentation

## 2021-01-11 DIAGNOSIS — Z89432 Acquired absence of left foot: Secondary | ICD-10-CM | POA: Insufficient documentation

## 2021-01-21 ENCOUNTER — Encounter (HOSPITAL_BASED_OUTPATIENT_CLINIC_OR_DEPARTMENT_OTHER): Payer: Medicare Other | Attending: Internal Medicine | Admitting: Internal Medicine

## 2021-02-15 DIAGNOSIS — D62 Acute posthemorrhagic anemia: Secondary | ICD-10-CM | POA: Insufficient documentation

## 2021-02-15 DIAGNOSIS — E669 Obesity, unspecified: Secondary | ICD-10-CM | POA: Insufficient documentation

## 2021-02-25 ENCOUNTER — Encounter (HOSPITAL_BASED_OUTPATIENT_CLINIC_OR_DEPARTMENT_OTHER): Payer: Medicare Other | Admitting: Physician Assistant

## 2021-03-26 ENCOUNTER — Encounter (HOSPITAL_BASED_OUTPATIENT_CLINIC_OR_DEPARTMENT_OTHER): Payer: Medicaid - Out of State | Admitting: Internal Medicine

## 2021-04-02 DIAGNOSIS — Z7189 Other specified counseling: Secondary | ICD-10-CM | POA: Insufficient documentation

## 2021-04-05 ENCOUNTER — Encounter: Payer: Self-pay | Admitting: *Deleted

## 2021-04-06 ENCOUNTER — Encounter: Payer: Self-pay | Admitting: Cardiology

## 2021-04-06 ENCOUNTER — Ambulatory Visit: Payer: Medicare Other | Admitting: Cardiology

## 2021-04-06 NOTE — Progress Notes (Deleted)
Cardiology Office Note  Date: 04/06/2021   ID: Ariel Arnold, DOB 09-Feb-1956, MRN [id number]  PCP:  Joaquin Courts, DO  Cardiologist:  No primary care provider on file. Electrophysiologist:  None   No chief complaint on file.   History of Present Illness: Ariel Arnold is a medically complex 65 y.o. female referred for cardiology consultation by Dr. Augustine Radar at [facility].  I reviewed extensive records and updated the chart.  She has a history of NASH cirrhosis with esophageal varices, also colonic AVMs and recurrent GI bleeding.  She was most recently hospitalized at [facility] with a recurrent upper GI bleed.  During evaluation in February of this year (at which point she was severely anemic with GI bleeding), she also reported chest discomfort with high-sensitivity troponin I of 306 down to 65.  ECG at the time showed no acute ST segment changes and echocardiography revealed normal LVEF at 55 to 60% without wall motion abnormalities.   Past Medical History:  Diagnosis Date  . Anemia   . Aortic atherosclerosis (HCC)   . AVM (arteriovenous malformation) of colon   . Essential hypertension   . GI bleeding    Recurrent  . Hepatic encephalopathy (HCC)   . History of RSV infection   . Liver cirrhosis secondary to NASH (HCC) 2004   Biopsy-proven  . Obesity   . Osteomyelitis (HCC)   . Thrombocytopenia (HCC)   . Type 2 diabetes mellitus (HCC)     *** The histories are not reviewed yet. Please review them in the "History" navigator section and refresh this SmartLink.  Current Outpatient Medications  Medication Sig Dispense Refill  . albuterol (VENTOLIN HFA) 108 (90 Base) MCG/ACT inhaler Inhale 2 puffs into the lungs every 4 (four) hours as needed.    . benzonatate (TESSALON) 100 MG capsule Take 100 mg by mouth 3 (three) times daily as needed.    . carvedilol (COREG) 3.125 MG tablet Take 3.125 mg by mouth 2 (two) times daily.    .  clotrimazole-betamethasone (LOTRISONE) cream Apply topically 2 (two) times daily.    . furosemide (LASIX) 20 MG tablet Take 1 tablet by mouth daily.    . hydrOXYzine (ATARAX/VISTARIL) 25 MG tablet Take 25 mg by mouth 3 (three) times daily.    . insulin glargine, 2 Unit Dial, (TOUJEO MAX SOLOSTAR) 300 UNIT/ML Solostar Pen 60 Units daily.    Marland Kitchen lactulose (CHRONULAC) 10 GM/15ML solution Take 20 g by mouth 2 (two) times daily.    . pantoprazole (PROTONIX) 40 MG tablet Take 1 tablet by mouth 2 (two) times daily.    . pravastatin (PRAVACHOL) 20 MG tablet Take 20 mg by mouth at bedtime.    . sertraline (ZOLOFT) 100 MG tablet Take 1.5 tablets by mouth daily.    Marland Kitchen spironolactone (ALDACTONE) 50 MG tablet Take 1 tablet by mouth daily.     No current facility-administered medications for this visit.   Allergies:  Patient has no allergy information on record.   Social History: The patient  reports that she has quit smoking. Her smoking use included cigarettes. She has never used smokeless tobacco. She reports current alcohol use. She reports that she does not use drugs.   Family History: The patient's family history includes Diabetes Mellitus II in her mother; Heart failure in her father and mother; Hyperlipidemia in her father and mother.   ROS:  Please see the history of present illness. Otherwise, complete review of systems is positive  for {NONE DEFAULTED:18576::"none"}.  All other systems are reviewed and negative.   Physical Exam: VS:  There were no vitals taken for this visit., BMI There is no height or weight on file to calculate BMI.  Wt Readings from Last 3 Encounters:  No data found for Wt    General: Patient appears comfortable at rest. HEENT: Conjunctiva and lids normal, oropharynx clear with moist mucosa. Neck: Supple, no elevated JVP or carotid bruits, no thyromegaly. Lungs: Clear to auscultation, nonlabored breathing at rest. Cardiac: Regular rate and rhythm, no S3 or significant  systolic murmur, no pericardial rub. Abdomen: Soft, nontender, no hepatomegaly, bowel sounds present, no guarding or rebound. Extremities: No pitting edema, distal pulses 2+. Skin: Warm and dry. Musculoskeletal: No kyphosis. Neuropsychiatric: Alert and oriented x3, affect grossly appropriate.  ECG:  An ECG dated 02/16/2021 was personally reviewed today and demonstrated:  Normal sinus rhythm.  Recent Labwork:  March 2022: TSH 0.802 April 2022: Hemoglobin 7.4, platelets 84, potassium 3.9, creatinine 1.0, BUN 15, AST 24, ALT 23, ammonia 43  Other Studies Reviewed Today:  Echocardiogram 02/16/2021 [facility] er): Left Ventricle  Normal left ventricular size. Wall thickness is normal. EF: 55-60%. Wall motion is normal. Doppler parameters indicate normal diastolic function.   Right Ventricle  Right ventricle is normal. Systolic function is normal.   Left Atrium  Left atrium is normal in size.   Right Atrium  Normal sized right atrium.   Mitral Valve  There is mild regurgitation.   Tricuspid Valve  Tricuspid valve structure is normal. There is trace regurgitation. The right ventricular systolic pressure is normal (<36 mmHg).   Aortic Valve  The aortic valve is tricuspid. The leaflets are not thickened and exhibit normal excursion. There is no regurgitation or stenosis.   Pulmonic Valve  The pulmonic valve was not well visualized. Trace regurgitation.   Ascending Aorta  The aortic root is normal in size.   Pericardium  There is no pericardial effusion.   Assessment and Plan:    Medication Adjustments/Labs and Tests Ordered: Current medicines are reviewed at length with the patient today.  Concerns regarding medicines are outlined above.   Tests Ordered: No orders of the defined types were placed in this encounter.   Medication Changes: No orders of the defined types were placed in this encounter.   Disposition:  Follow up {follow  up:15908}  Signed, Jonelle Sidle, MD, [facility] 04/06/2021 12:44 PM    [facility] at [facility] [address] [place], [place], [place] [address] Phone: [phone]; Fax: [phone]

## 2021-04-19 DIAGNOSIS — M7989 Other specified soft tissue disorders: Secondary | ICD-10-CM | POA: Insufficient documentation

## 2021-04-27 DIAGNOSIS — M79661 Pain in right lower leg: Secondary | ICD-10-CM | POA: Insufficient documentation

## 2021-05-19 ENCOUNTER — Emergency Department (HOSPITAL_COMMUNITY): Payer: Medicare Other

## 2021-05-19 ENCOUNTER — Inpatient Hospital Stay (HOSPITAL_COMMUNITY)
Admission: EM | Admit: 2021-05-19 | Discharge: 2021-05-26 | DRG: 263 | Disposition: A | Discharge status: Home Health | Payer: Medicare Other | Attending: Internal Medicine | Admitting: Internal Medicine

## 2021-05-19 ENCOUNTER — Encounter (HOSPITAL_COMMUNITY): Payer: Self-pay

## 2021-05-19 ENCOUNTER — Other Ambulatory Visit: Payer: Self-pay

## 2021-05-19 DIAGNOSIS — D509 Iron deficiency anemia, unspecified: Secondary | ICD-10-CM | POA: Diagnosis present

## 2021-05-19 DIAGNOSIS — Z87891 Personal history of nicotine dependence: Secondary | ICD-10-CM

## 2021-05-19 DIAGNOSIS — K7460 Unspecified cirrhosis of liver: Secondary | ICD-10-CM | POA: Diagnosis not present

## 2021-05-19 DIAGNOSIS — K7469 Other cirrhosis of liver: Secondary | ICD-10-CM | POA: Diagnosis present

## 2021-05-19 DIAGNOSIS — M86172 Other acute osteomyelitis, left ankle and foot: Secondary | ICD-10-CM | POA: Diagnosis present

## 2021-05-19 DIAGNOSIS — S2243XA Multiple fractures of ribs, bilateral, initial encounter for closed fracture: Secondary | ICD-10-CM

## 2021-05-19 DIAGNOSIS — I10 Essential (primary) hypertension: Secondary | ICD-10-CM | POA: Diagnosis present

## 2021-05-19 DIAGNOSIS — I2510 Atherosclerotic heart disease of native coronary artery without angina pectoris: Secondary | ICD-10-CM | POA: Diagnosis present

## 2021-05-19 DIAGNOSIS — K7682 Hepatic encephalopathy: Secondary | ICD-10-CM | POA: Diagnosis present

## 2021-05-19 DIAGNOSIS — Z794 Long term (current) use of insulin: Secondary | ICD-10-CM

## 2021-05-19 DIAGNOSIS — D6959 Other secondary thrombocytopenia: Secondary | ICD-10-CM | POA: Diagnosis present

## 2021-05-19 DIAGNOSIS — Z452 Encounter for adjustment and management of vascular access device: Secondary | ICD-10-CM

## 2021-05-19 DIAGNOSIS — Z89432 Acquired absence of left foot: Secondary | ICD-10-CM

## 2021-05-19 DIAGNOSIS — Z4659 Encounter for fitting and adjustment of other gastrointestinal appliance and device: Secondary | ICD-10-CM

## 2021-05-19 DIAGNOSIS — K7581 Nonalcoholic steatohepatitis (NASH): Secondary | ICD-10-CM | POA: Diagnosis not present

## 2021-05-19 DIAGNOSIS — Z79899 Other long term (current) drug therapy: Secondary | ICD-10-CM

## 2021-05-19 DIAGNOSIS — K922 Gastrointestinal hemorrhage, unspecified: Secondary | ICD-10-CM | POA: Diagnosis not present

## 2021-05-19 DIAGNOSIS — I864 Gastric varices: Secondary | ICD-10-CM | POA: Diagnosis not present

## 2021-05-19 DIAGNOSIS — K7290 Hepatic failure, unspecified without coma: Secondary | ICD-10-CM | POA: Diagnosis present

## 2021-05-19 DIAGNOSIS — K3189 Other diseases of stomach and duodenum: Secondary | ICD-10-CM | POA: Diagnosis present

## 2021-05-19 DIAGNOSIS — E669 Obesity, unspecified: Secondary | ICD-10-CM | POA: Diagnosis present

## 2021-05-19 DIAGNOSIS — W19XXXA Unspecified fall, initial encounter: Secondary | ICD-10-CM

## 2021-05-19 DIAGNOSIS — J9600 Acute respiratory failure, unspecified whether with hypoxia or hypercapnia: Secondary | ICD-10-CM

## 2021-05-19 DIAGNOSIS — D62 Acute posthemorrhagic anemia: Secondary | ICD-10-CM | POA: Diagnosis present

## 2021-05-19 DIAGNOSIS — D696 Thrombocytopenia, unspecified: Secondary | ICD-10-CM | POA: Diagnosis present

## 2021-05-19 DIAGNOSIS — E785 Hyperlipidemia, unspecified: Secondary | ICD-10-CM | POA: Diagnosis present

## 2021-05-19 DIAGNOSIS — Z20822 Contact with and (suspected) exposure to covid-19: Secondary | ICD-10-CM | POA: Diagnosis present

## 2021-05-19 DIAGNOSIS — I8510 Secondary esophageal varices without bleeding: Secondary | ICD-10-CM | POA: Diagnosis present

## 2021-05-19 DIAGNOSIS — K920 Hematemesis: Secondary | ICD-10-CM

## 2021-05-19 DIAGNOSIS — L8990 Pressure ulcer of unspecified site, unspecified stage: Secondary | ICD-10-CM | POA: Insufficient documentation

## 2021-05-19 DIAGNOSIS — J9601 Acute respiratory failure with hypoxia: Secondary | ICD-10-CM | POA: Diagnosis present

## 2021-05-19 DIAGNOSIS — Z833 Family history of diabetes mellitus: Secondary | ICD-10-CM

## 2021-05-19 DIAGNOSIS — Z6826 Body mass index (BMI) 26.0-26.9, adult: Secondary | ICD-10-CM

## 2021-05-19 DIAGNOSIS — Z7984 Long term (current) use of oral hypoglycemic drugs: Secondary | ICD-10-CM

## 2021-05-19 DIAGNOSIS — G9340 Encephalopathy, unspecified: Secondary | ICD-10-CM | POA: Diagnosis present

## 2021-05-19 DIAGNOSIS — K2961 Other gastritis with bleeding: Secondary | ICD-10-CM | POA: Diagnosis present

## 2021-05-19 DIAGNOSIS — Z83438 Family history of other disorder of lipoprotein metabolism and other lipidemia: Secondary | ICD-10-CM

## 2021-05-19 DIAGNOSIS — Z8249 Family history of ischemic heart disease and other diseases of the circulatory system: Secondary | ICD-10-CM

## 2021-05-19 LAB — CBC WITH DIFFERENTIAL/PLATELET
Abs Immature Granulocytes: 0.01 10*3/uL (ref 0.00–0.07)
Basophils Absolute: 0 10*3/uL (ref 0.0–0.1)
Basophils Relative: 0 %
Eosinophils Absolute: 0 10*3/uL (ref 0.0–0.5)
Eosinophils Relative: 1 %
HCT: 26.5 % — ABNORMAL LOW (ref 36.0–46.0)
Hemoglobin: 8 g/dL — ABNORMAL LOW (ref 12.0–15.0)
Immature Granulocytes: 0 %
Lymphocytes Relative: 28 %
Lymphs Abs: 0.8 10*3/uL (ref 0.7–4.0)
MCH: 29.6 pg (ref 26.0–34.0)
MCHC: 30.2 g/dL (ref 30.0–36.0)
MCV: 98.1 fL (ref 80.0–100.0)
Monocytes Absolute: 0.2 10*3/uL (ref 0.1–1.0)
Monocytes Relative: 7 %
Neutro Abs: 1.7 10*3/uL (ref 1.7–7.7)
Neutrophils Relative %: 64 %
Platelets: 62 10*3/uL — ABNORMAL LOW (ref 150–400)
RBC: 2.7 MIL/uL — ABNORMAL LOW (ref 3.87–5.11)
RDW: 20.1 % — ABNORMAL HIGH (ref 11.5–15.5)
WBC: 2.7 10*3/uL — ABNORMAL LOW (ref 4.0–10.5)
nRBC: 0 % (ref 0.0–0.2)

## 2021-05-19 LAB — COMPREHENSIVE METABOLIC PANEL
ALT: 41 U/L (ref 0–44)
AST: 71 U/L — ABNORMAL HIGH (ref 15–41)
Albumin: 2.3 g/dL — ABNORMAL LOW (ref 3.5–5.0)
Alkaline Phosphatase: 107 U/L (ref 38–126)
Anion gap: 7 (ref 5–15)
BUN: 32 mg/dL — ABNORMAL HIGH (ref 8–23)
CO2: 25 mmol/L (ref 22–32)
Calcium: 8.6 mg/dL — ABNORMAL LOW (ref 8.9–10.3)
Chloride: 106 mmol/L (ref 98–111)
Creatinine, Ser: 0.72 mg/dL (ref 0.44–1.00)
GFR, Estimated: >60 mL/min (ref >60)
Glucose, Bld: 319 mg/dL — ABNORMAL HIGH (ref 70–99)
Potassium: 5.1 mmol/L (ref 3.5–5.1)
Sodium: 138 mmol/L (ref 135–145)
Total Bilirubin: 1.6 mg/dL — ABNORMAL HIGH (ref 0.3–1.2)
Total Protein: 5.7 g/dL — ABNORMAL LOW (ref 6.5–8.1)

## 2021-05-19 LAB — POC OCCULT BLOOD, ED: Fecal Occult Bld: POSITIVE — AB

## 2021-05-19 LAB — RESP PANEL BY RT-PCR (FLU A&B, COVID) ARPGX2
Influenza A by PCR: NEGATIVE
Influenza B by PCR: NEGATIVE
SARS Coronavirus 2 by RT PCR: NEGATIVE

## 2021-05-19 LAB — PROTIME-INR
INR: 1.7 — ABNORMAL HIGH (ref 0.8–1.2)
Prothrombin Time: 19.6 seconds — ABNORMAL HIGH (ref 11.4–15.2)

## 2021-05-19 LAB — LIPASE, BLOOD: Lipase: 24 U/L (ref 11–51)

## 2021-05-19 LAB — GLUCOSE, CAPILLARY: Glucose-Capillary: 182 mg/dL — ABNORMAL HIGH (ref 70–99)

## 2021-05-19 IMAGING — DX DG KNEE COMPLETE 4+V*R*
4 series · 4 of 4 positions shown · non-contrast
Comparison: None.

CLINICAL DATA: Recent fall with right knee pain, initial encounter

EXAM:
RIGHT KNEE - COMPLETE 4+ VIEW

[knee ap]
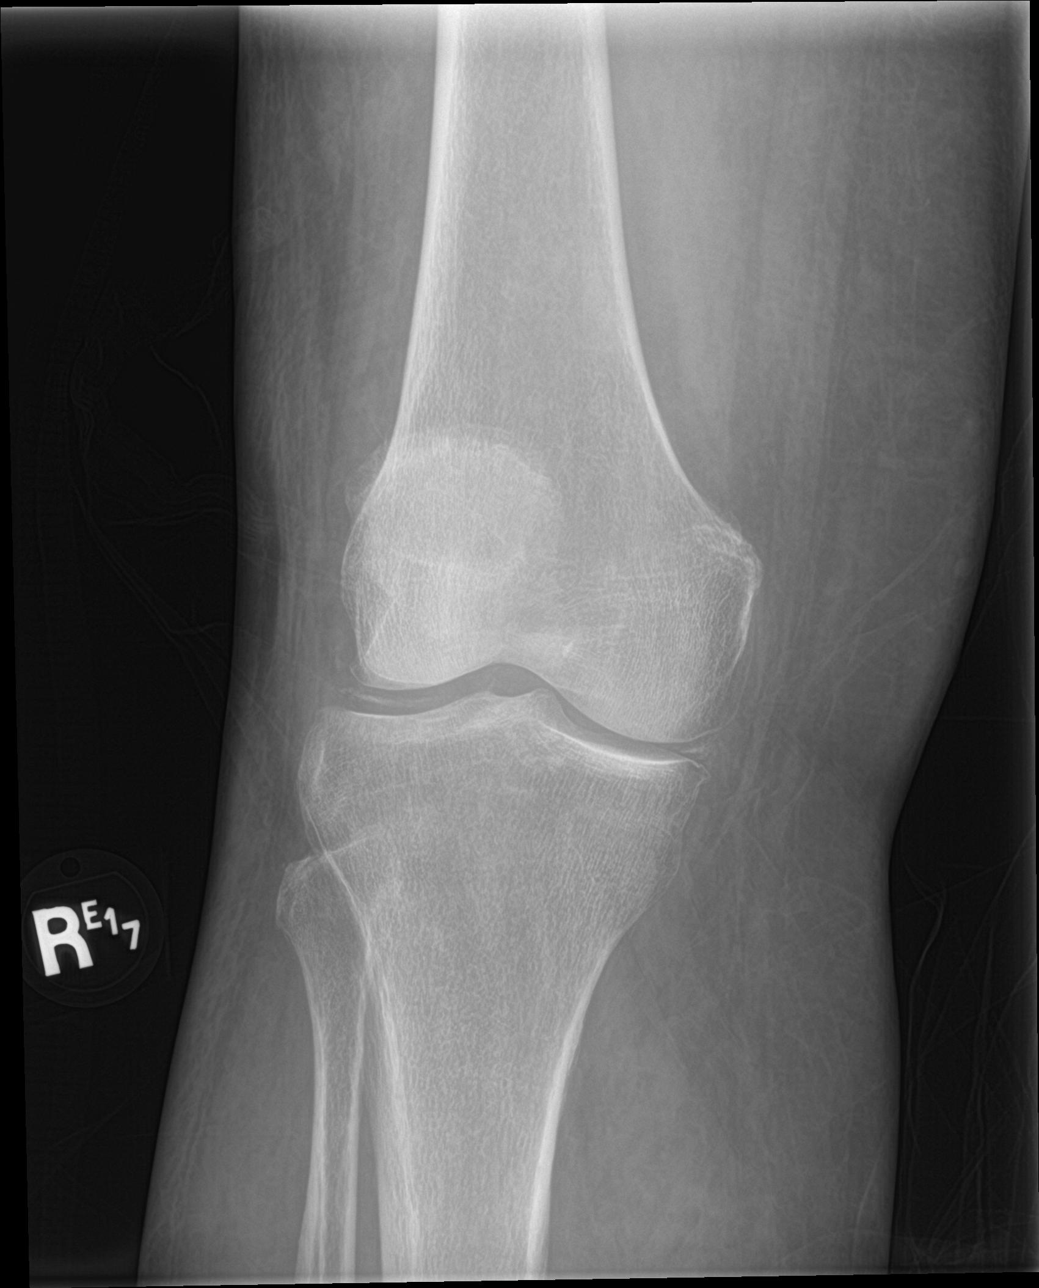

[knee obl (1 of 2)]
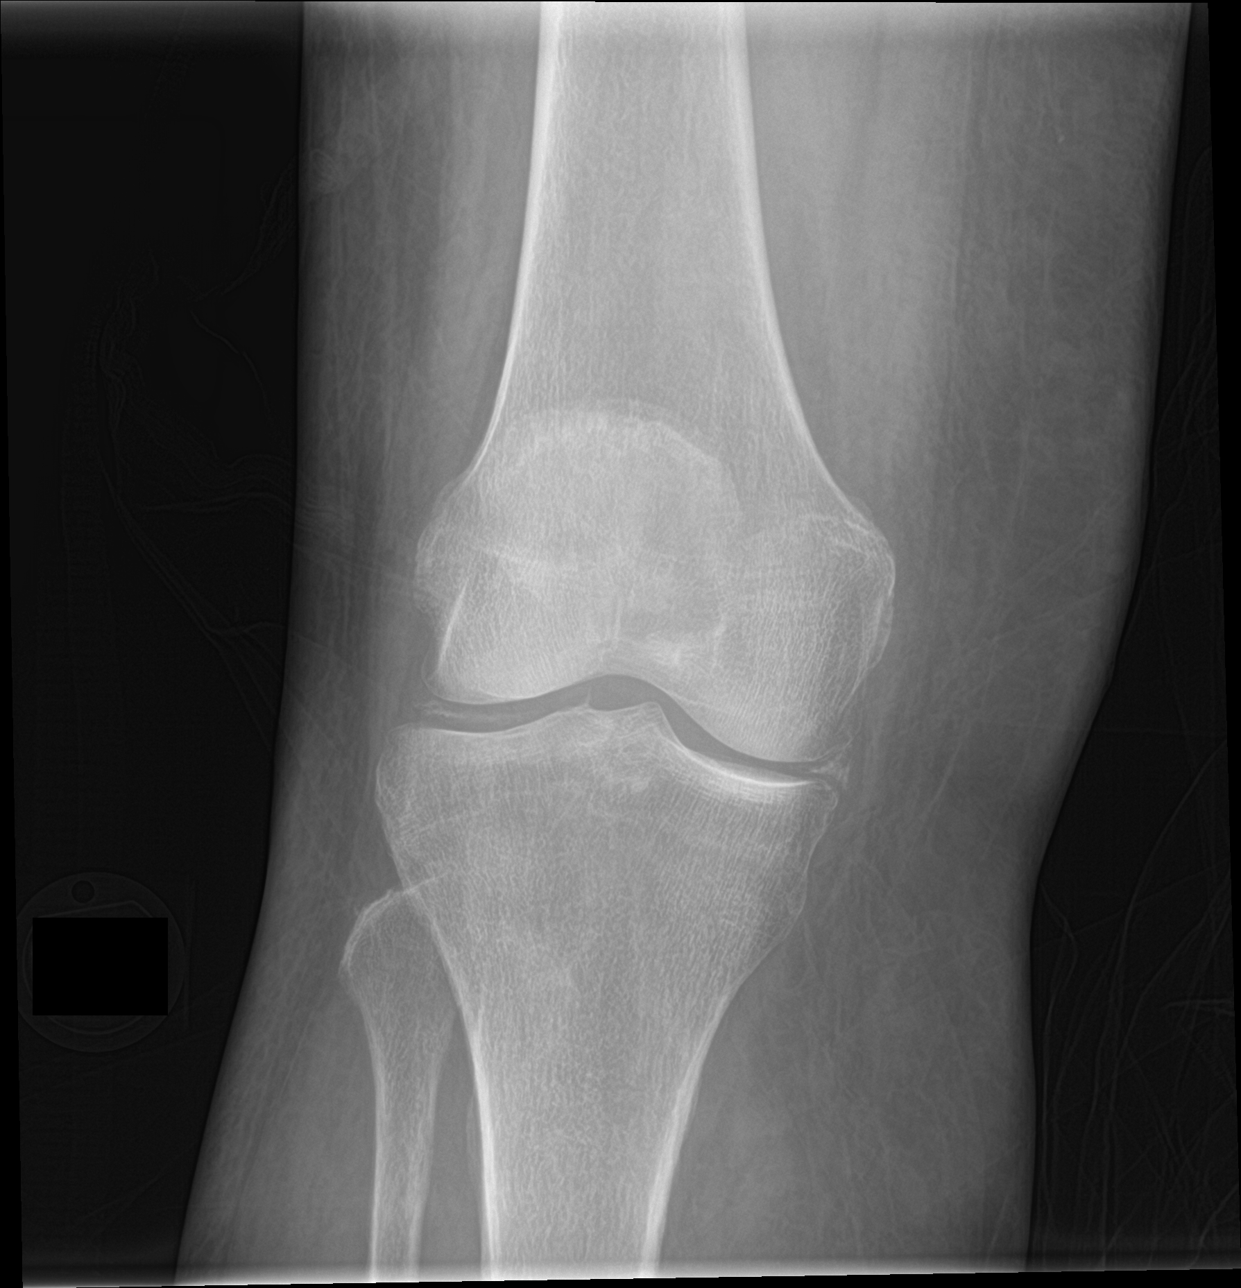

[knee obl (2 of 2)]
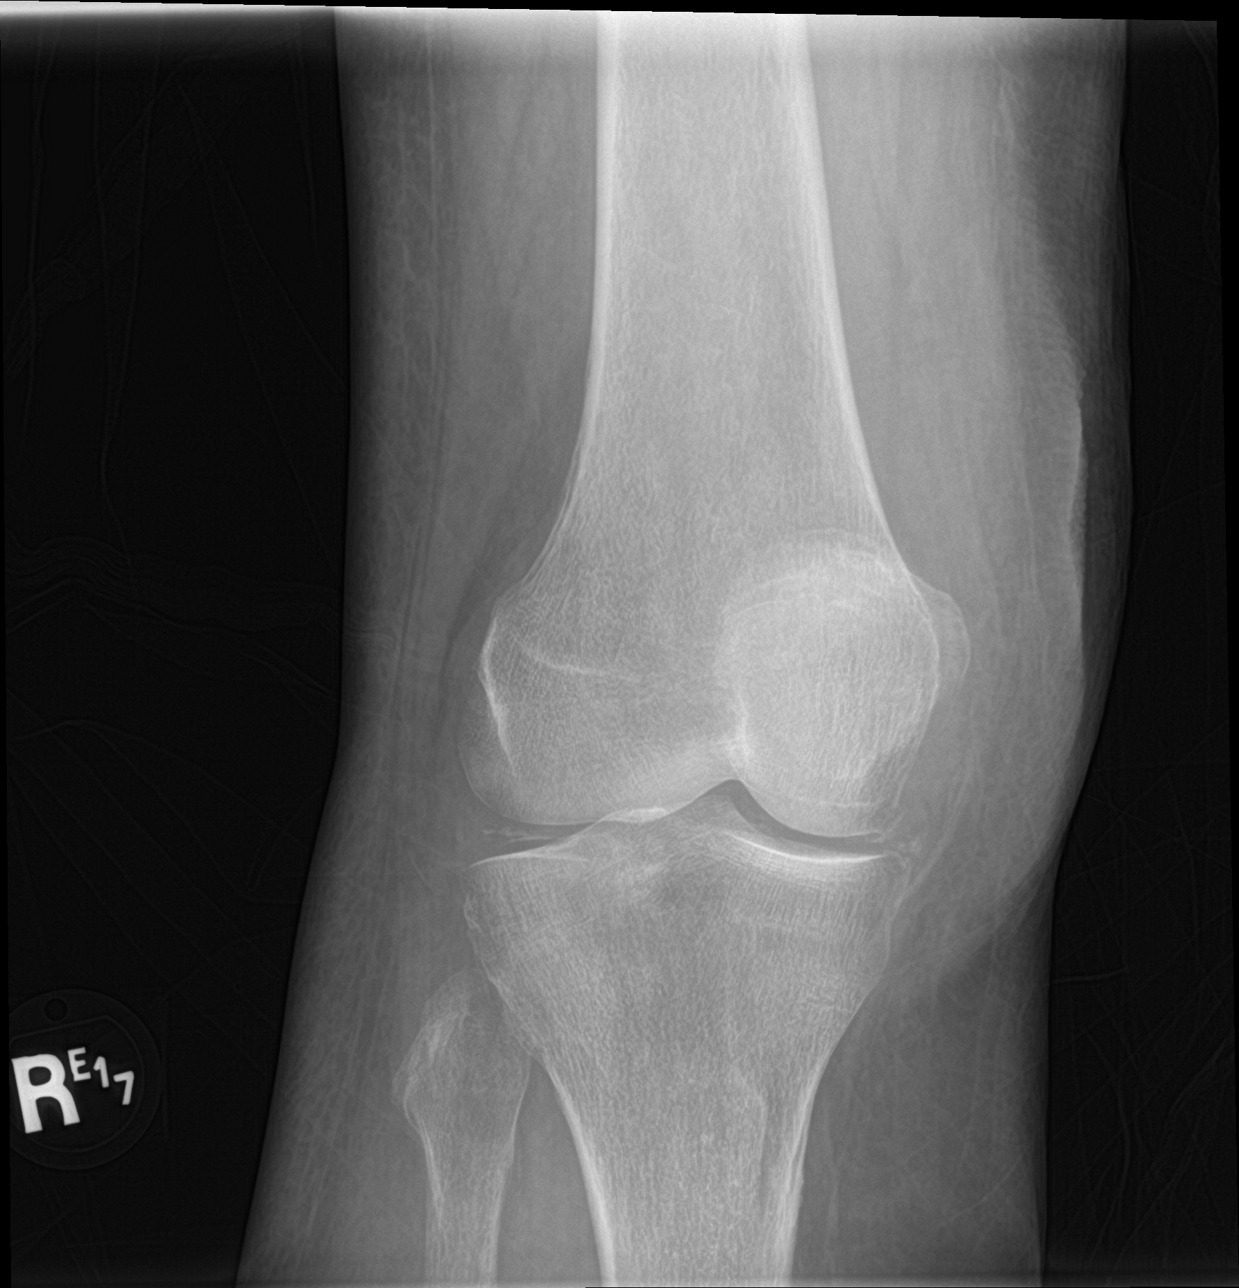

[knee lat]
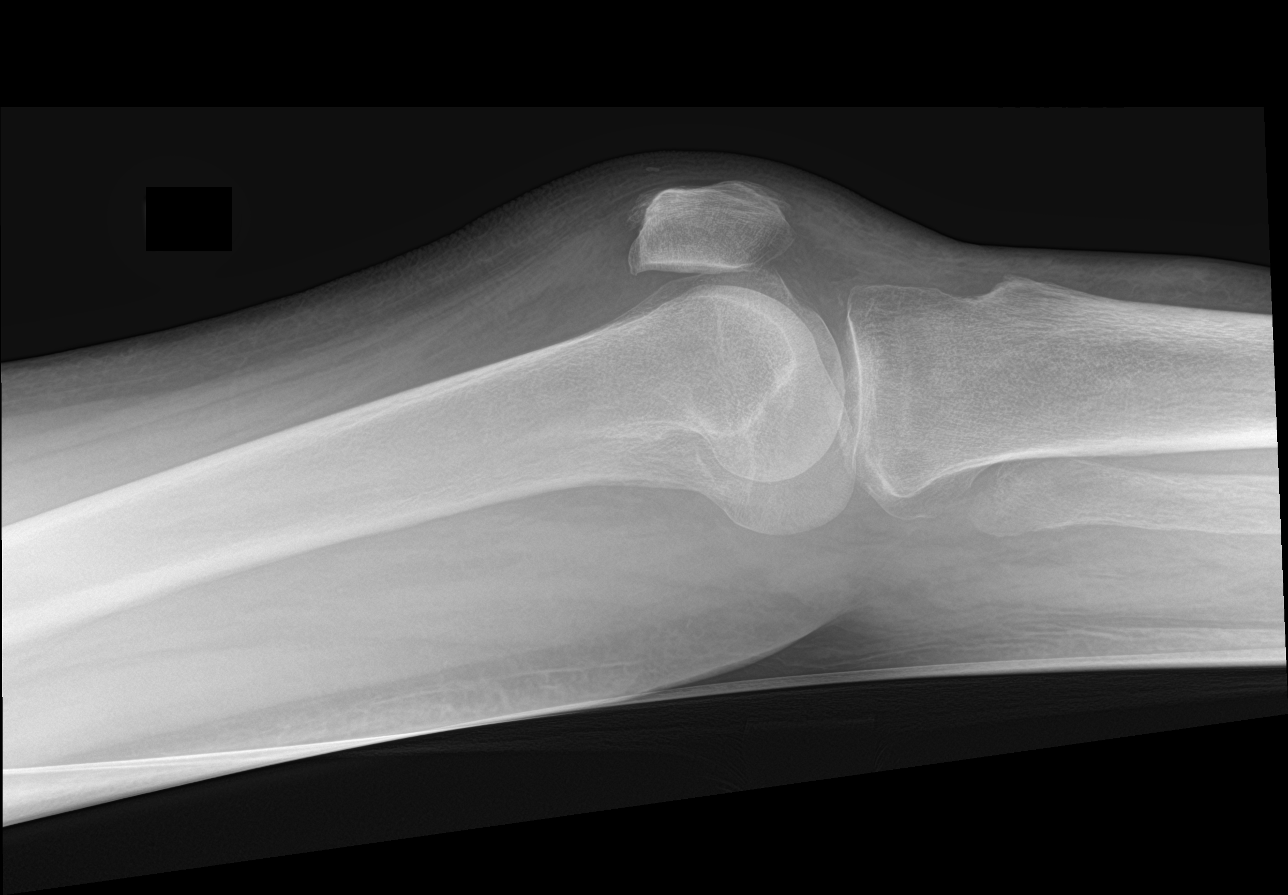

[4 of 4 positions shown; findings below may reference images not displayed]

FINDINGS: Meniscal calcification is identified. No fracture or dislocation is
seen. No joint effusion is noted. Mild soft tissue swelling is noted
anteriorly consistent with the recent injury.
IMPRESSION: Soft tissue swelling without acute bony abnormality.

## 2021-05-19 IMAGING — DX DG RIBS 2V*R*
4 series · 4 of 4 positions shown · non-contrast
Comparison: None.

CLINICAL DATA: Right-sided rib pain following fall, initial
encounter

EXAM:
RIGHT RIBS - 2 VIEW

[rib pa (1 of 2)]
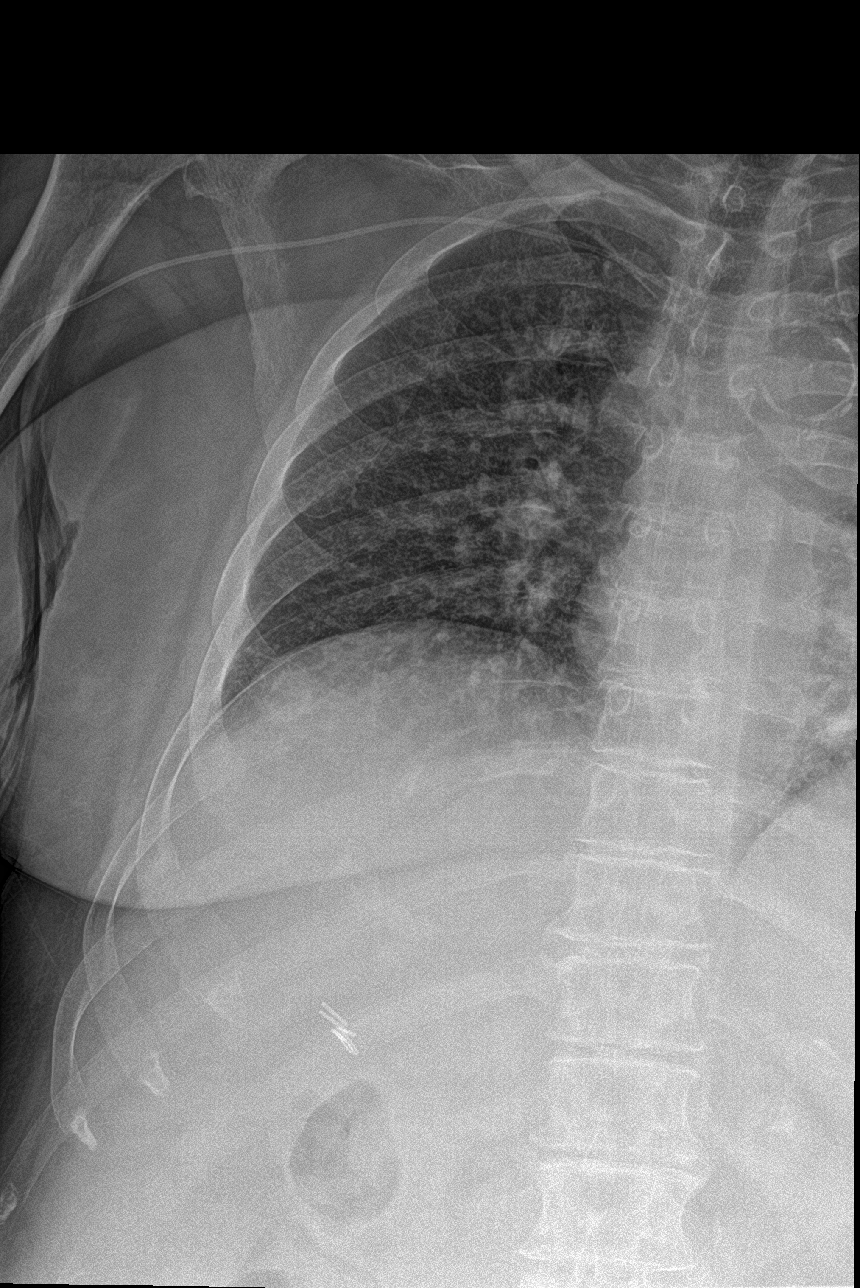

[rib pa obl (1 of 2)]
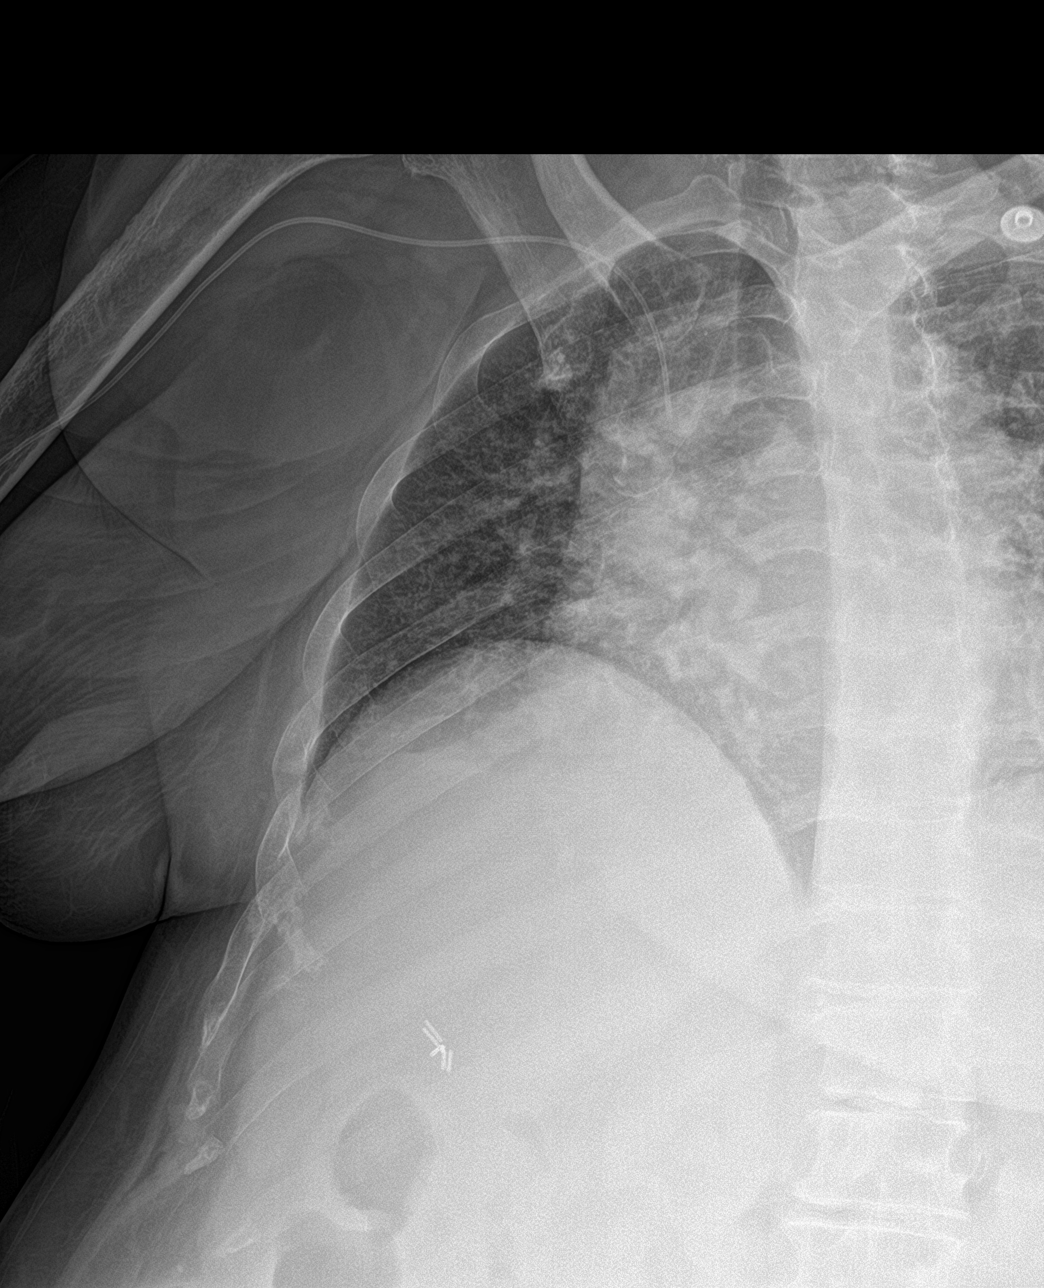

[rib pa obl (2 of 2)]
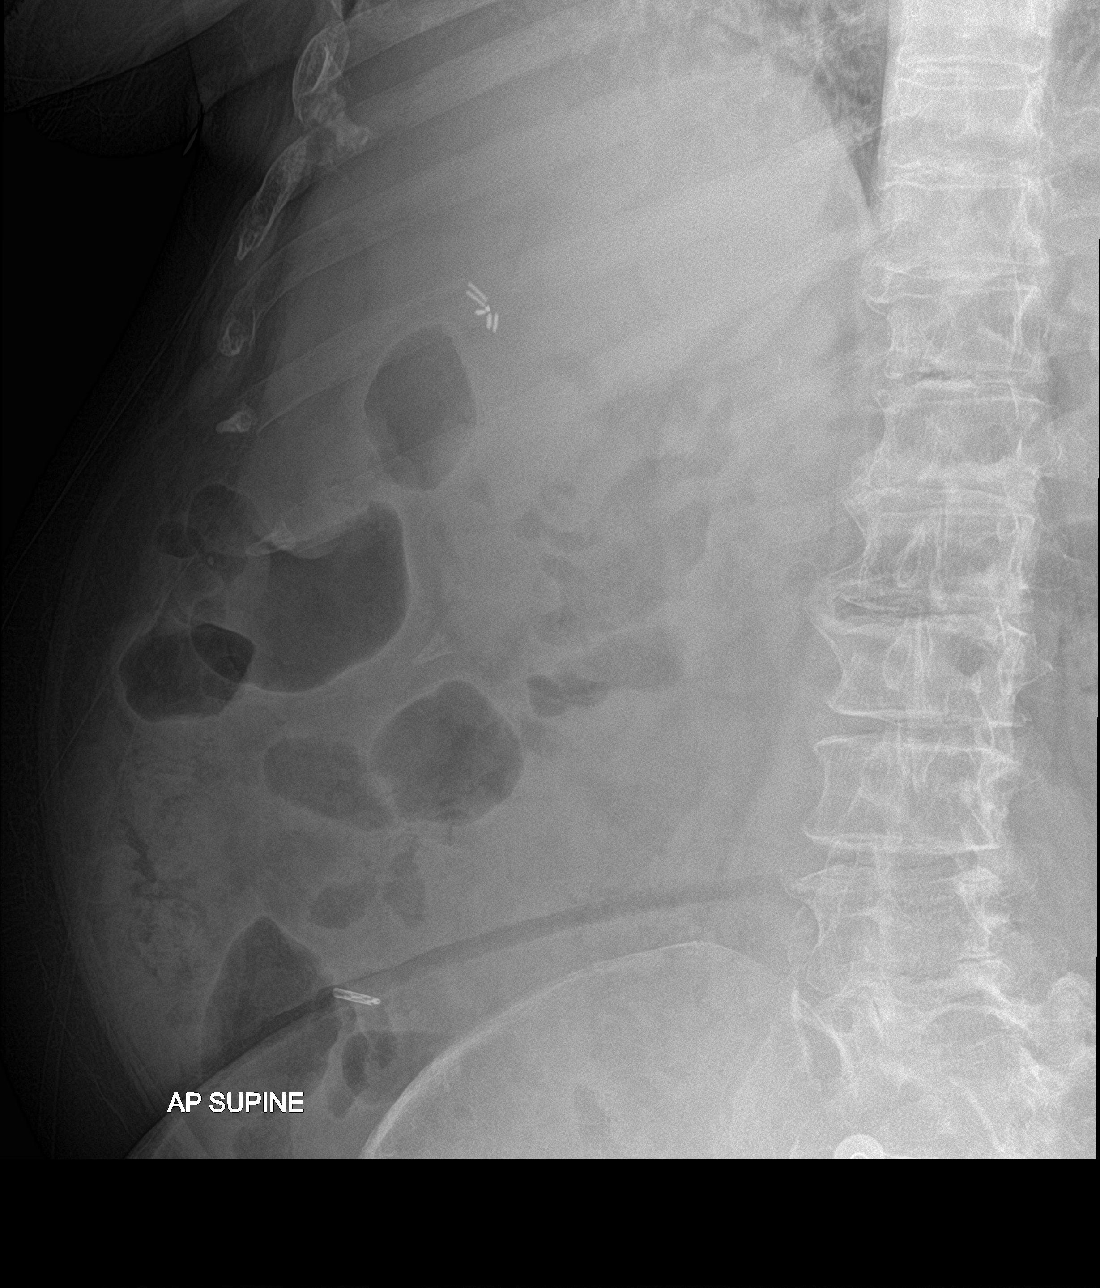

[rib pa (2 of 2)]
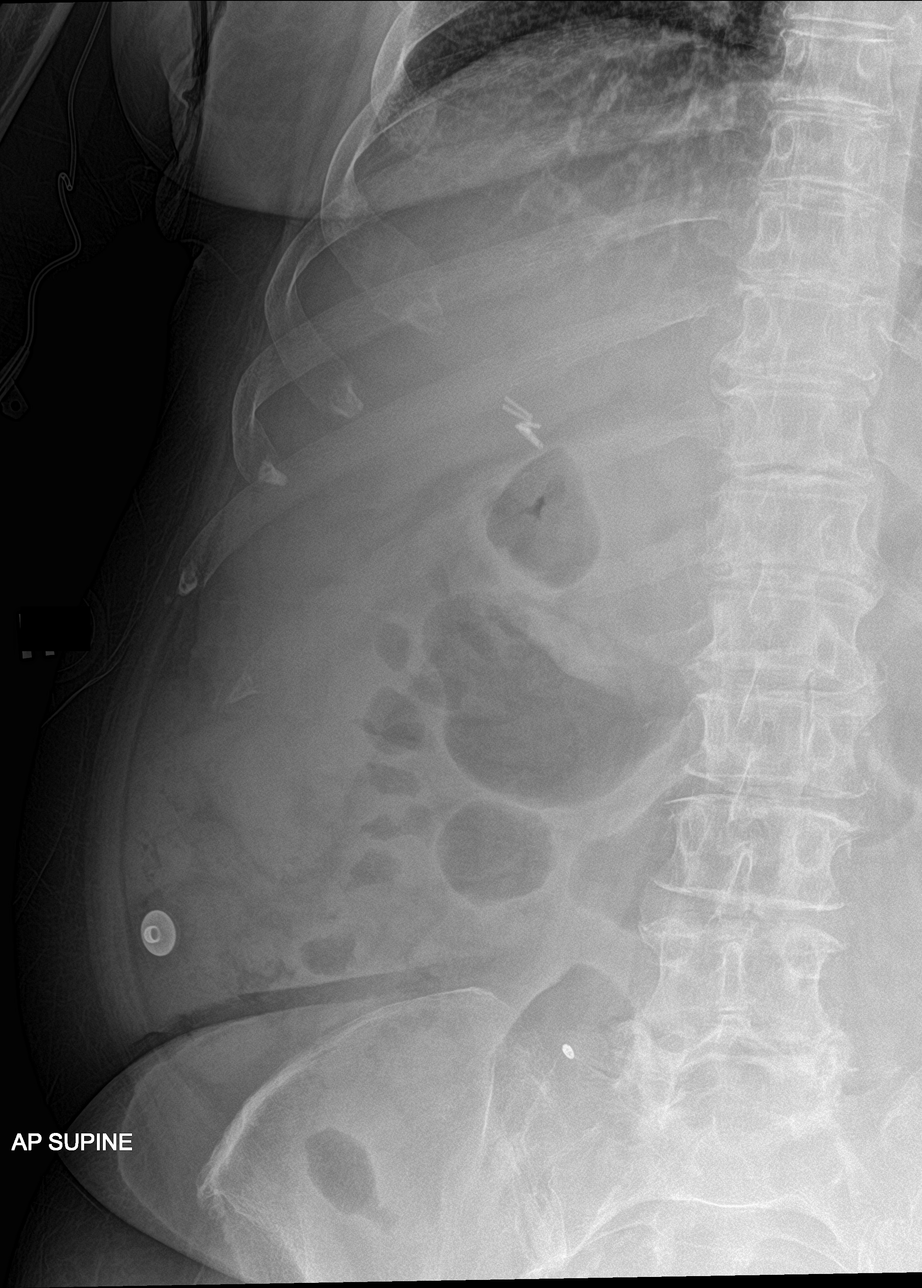

[4 of 4 positions shown; findings below may reference images not displayed]

FINDINGS: Mild irregularity of the anterior aspect of the right fifth through
seventh ribs is noted consistent with acute fracture. No
pneumothorax is seen. No other focal abnormality is noted.
IMPRESSION: Right-sided rib fractures as described without complicating factors.

## 2021-05-19 IMAGING — DX DG RIBS W/ CHEST 3+V*L*
5 series · 5 of 5 positions shown · non-contrast
Comparison: None.

CLINICAL DATA: Recent fall with bilateral rib pain, initial
encounter

EXAM:
LEFT RIBS AND CHEST - 3+ VIEW

[rib pa obl (1 of 2)]
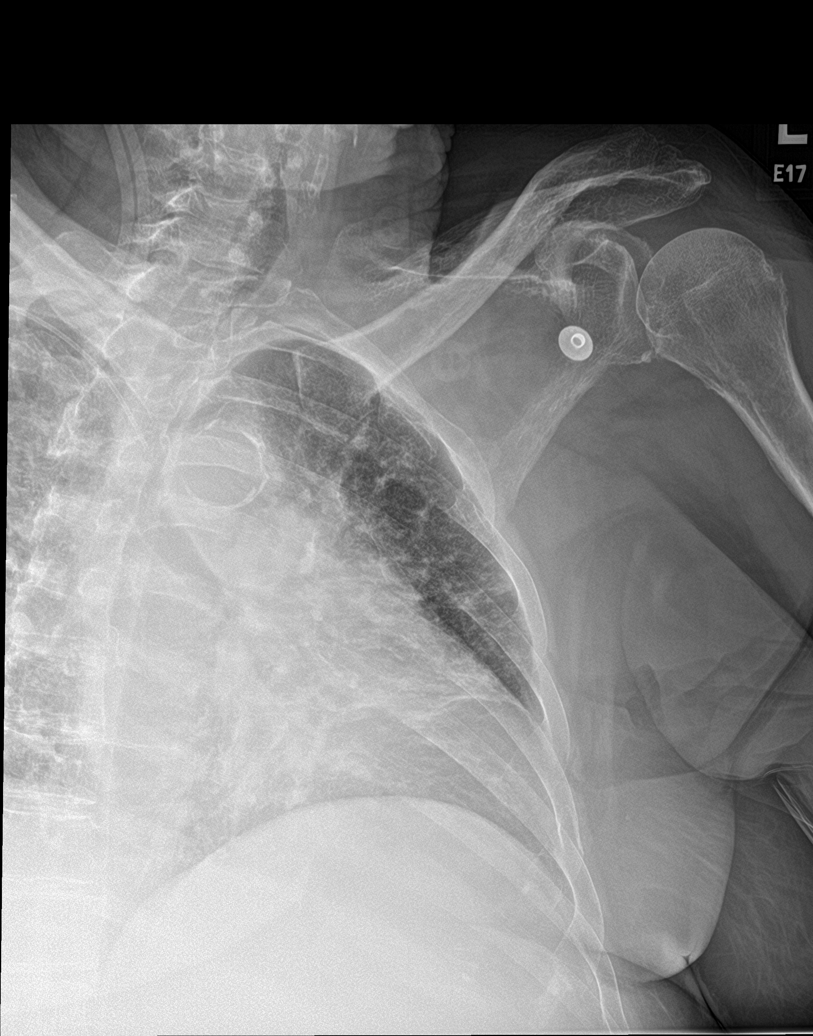

[rib pa obl (2 of 2)]
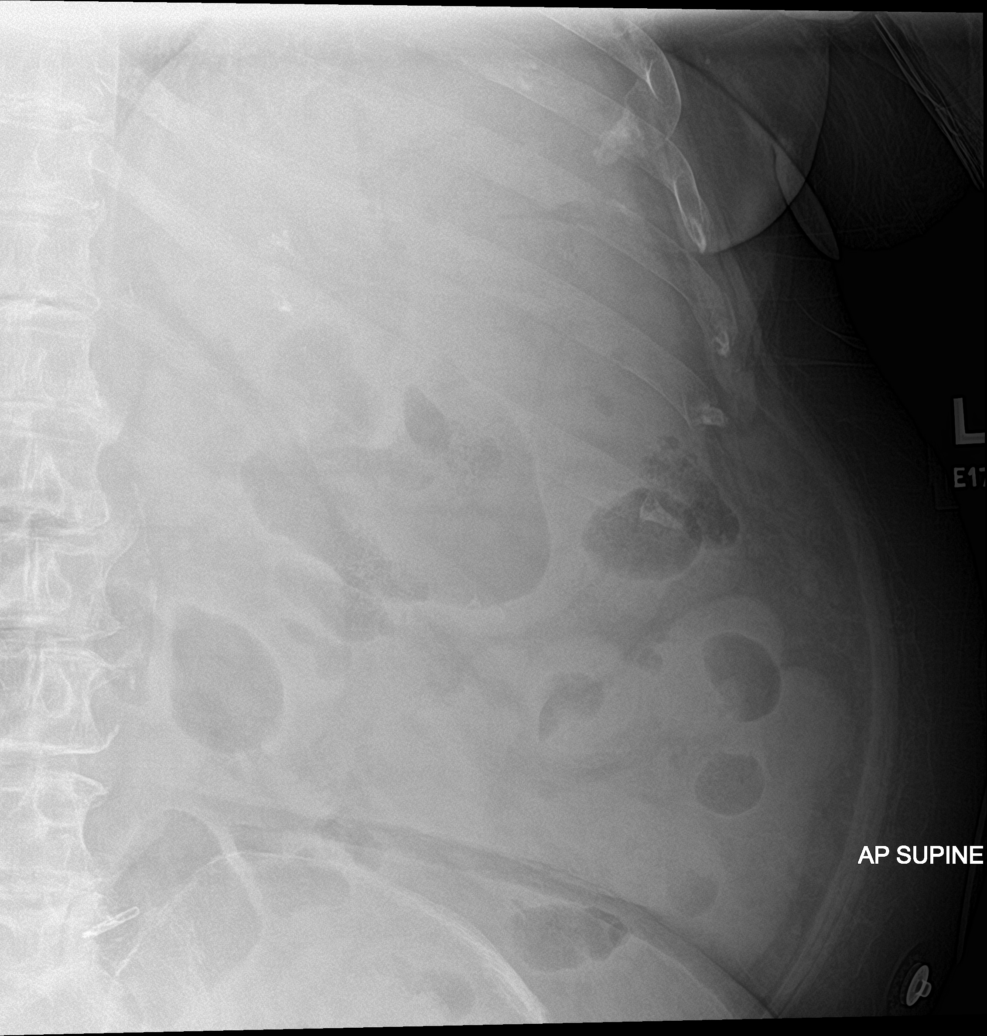

[rib pa (1 of 2)]
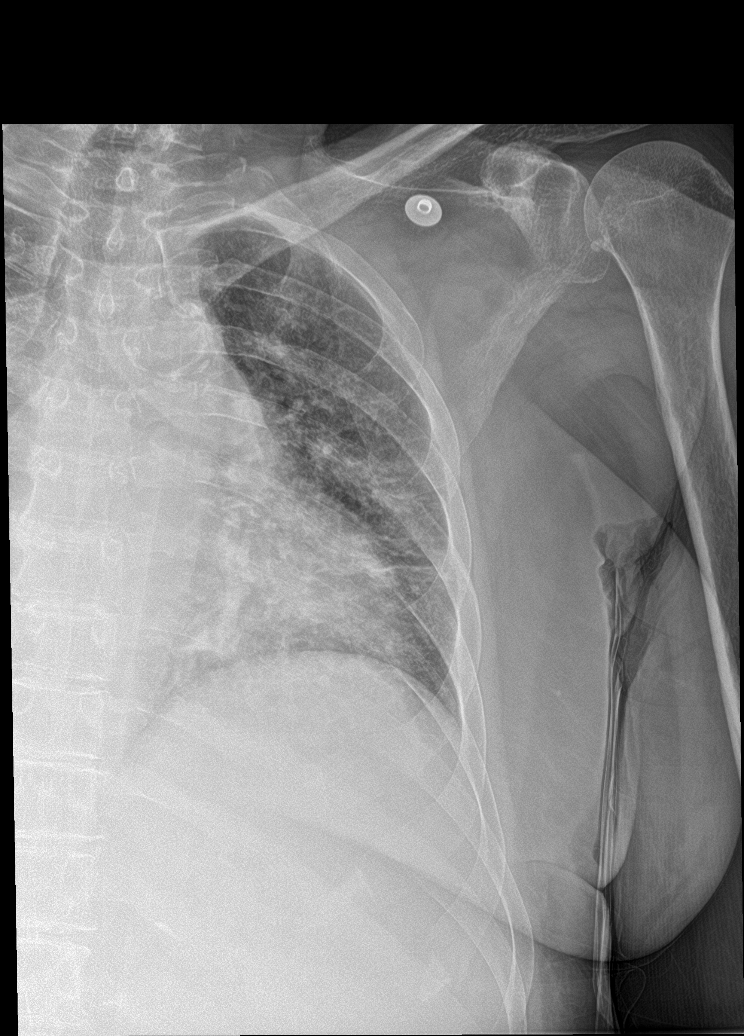

[chest ap]
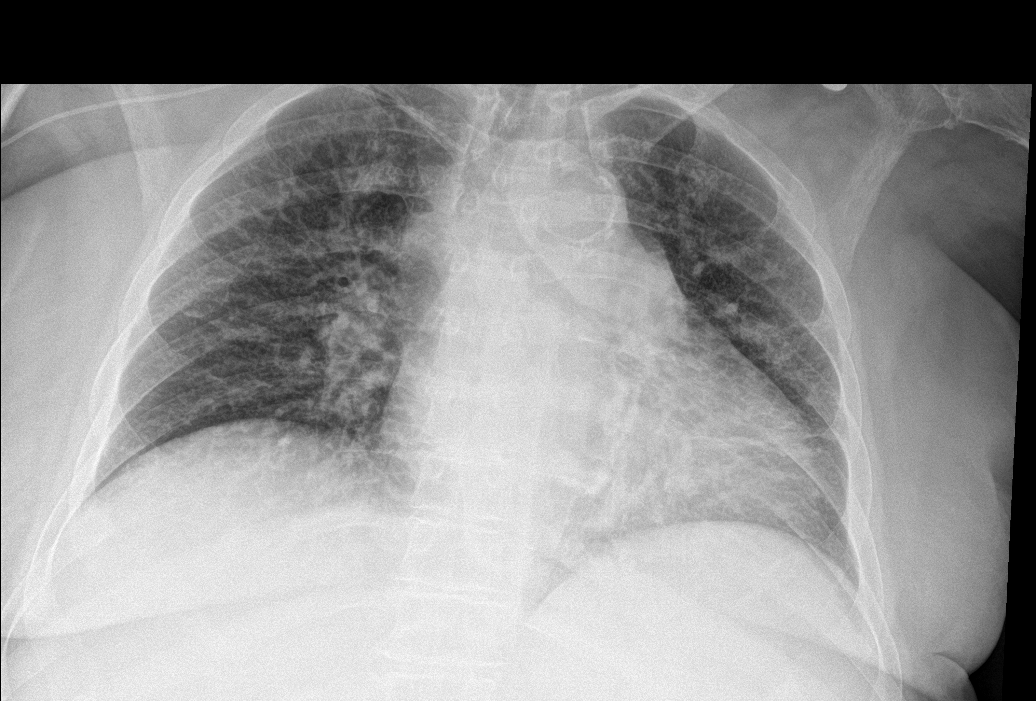

[rib pa (2 of 2)]
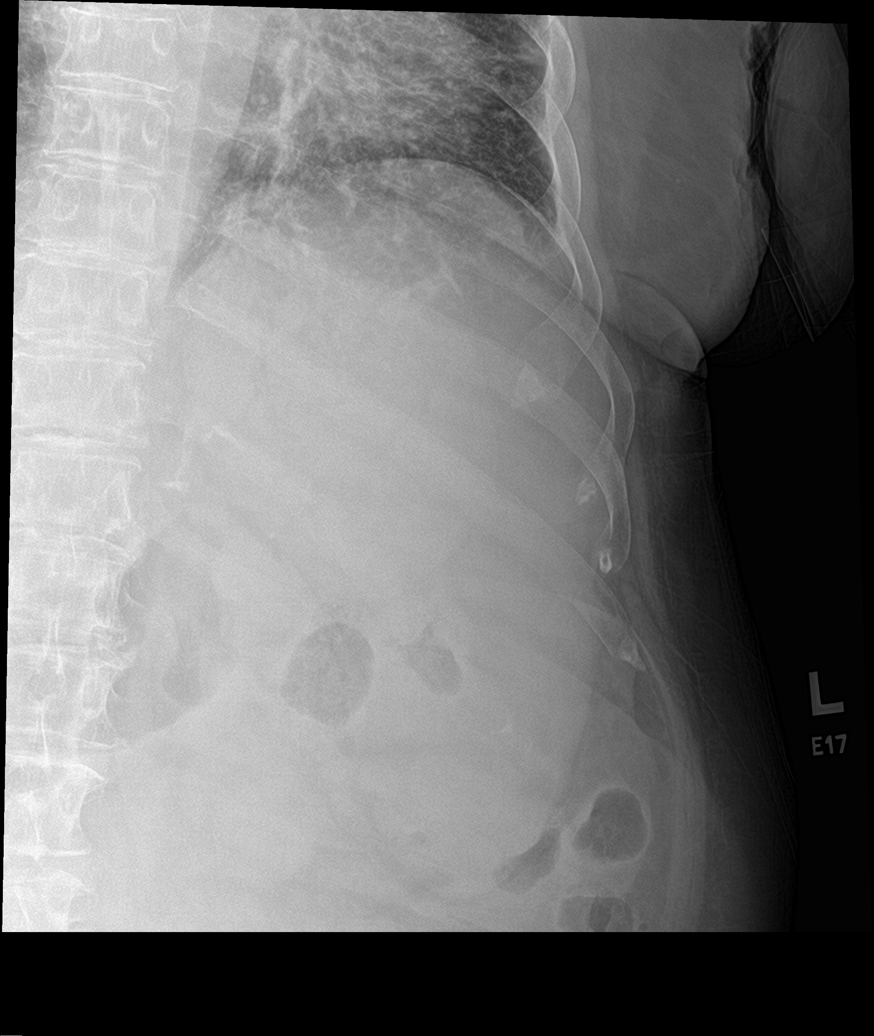

[5 of 5 positions shown; findings below may reference images not displayed]

FINDINGS: Minimally displaced left fifth rib fracture is noted anteriorly. No
other fractures are seen. No pneumothorax is noted. Right-sided PICC
line is noted at the junction of the innominate veins.
IMPRESSION: Left fifth rib fracture without complicating factors.

## 2021-05-19 IMAGING — CT CT HEAD W/O CM
3 series · 16 of 47 positions shown, 19 images · non-contrast
Comparison: [DATE]

CLINICAL DATA: Head trauma

EXAM:
CT HEAD WITHOUT CONTRAST
TECHNIQUE: Contiguous axial images were obtained from the base of the skull
through the vertex without intravenous contrast.

[Series 2: head w o · axial · 0.44mm/px · z∈[+1569,+1699]mm · 10 of 32 slices shown, 13 images]
[im 3/32  brain]
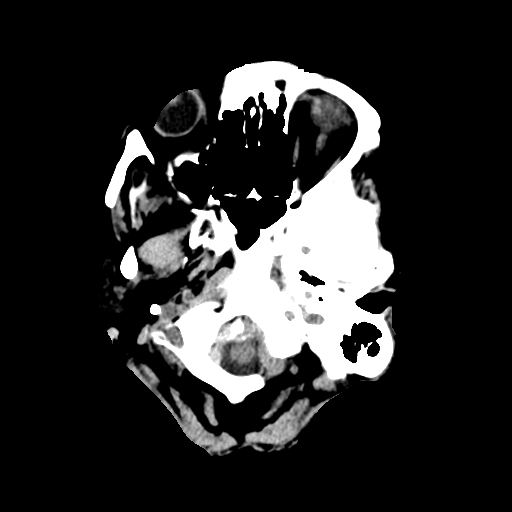
[im 3/32  bone]
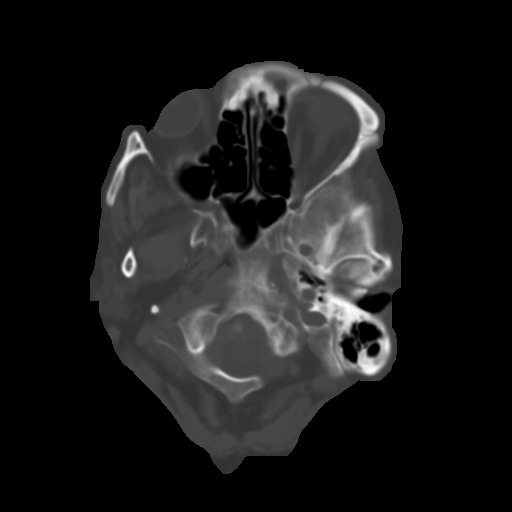
[im 6/32  brain]
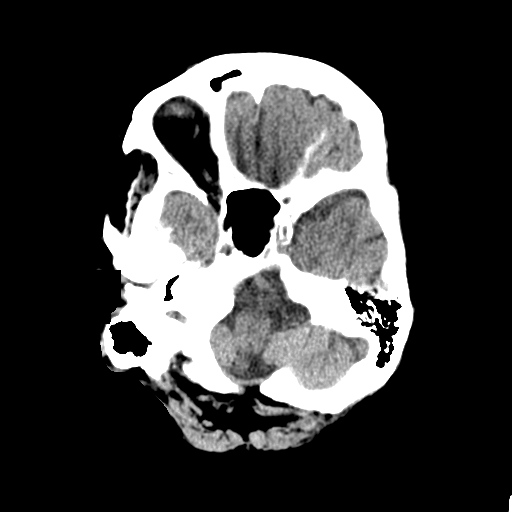
[im 9/32  brain]
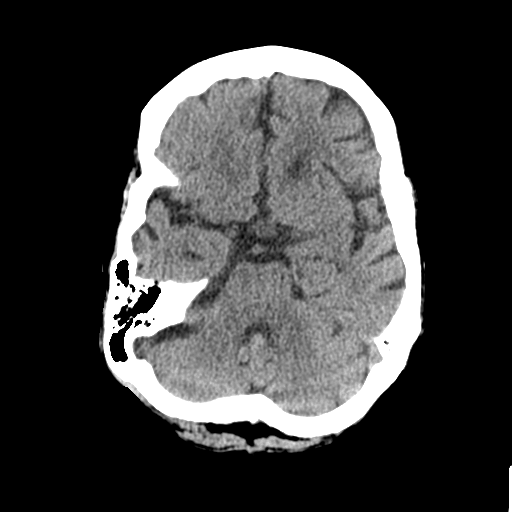
[im 11/32  brain]
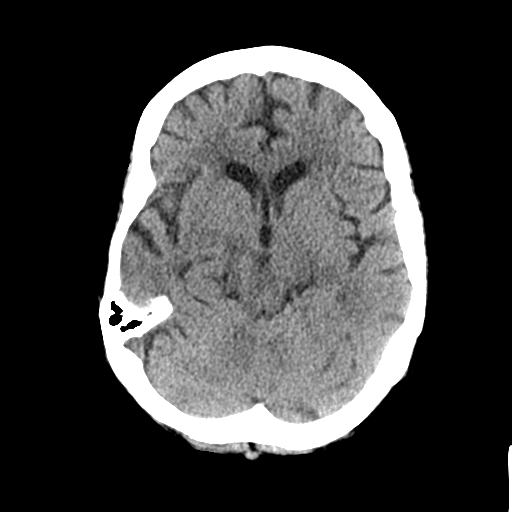
[im 14/32  brain]
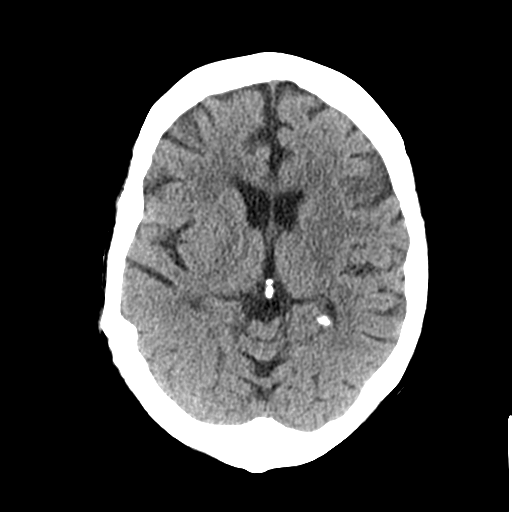
[im 14/32  bone]
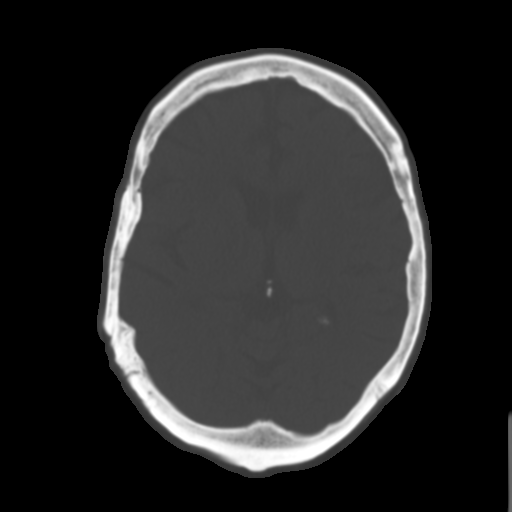
[im 18/32  brain]
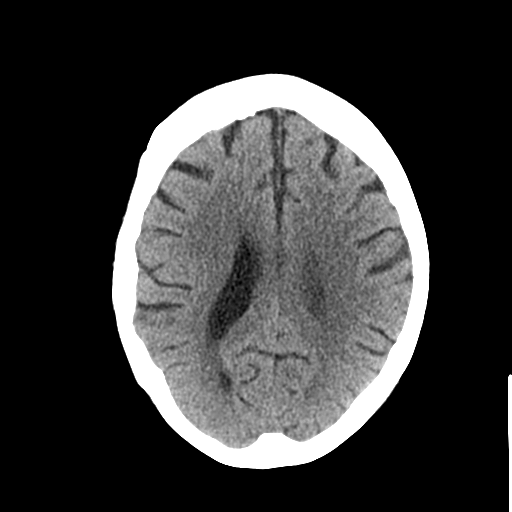
[im 21/32  brain]
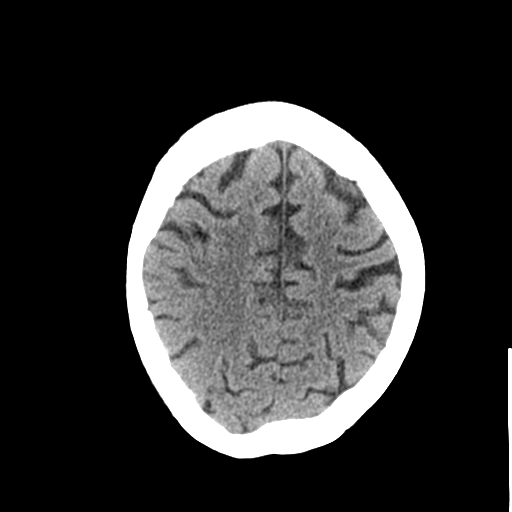
[im 24/32  brain]
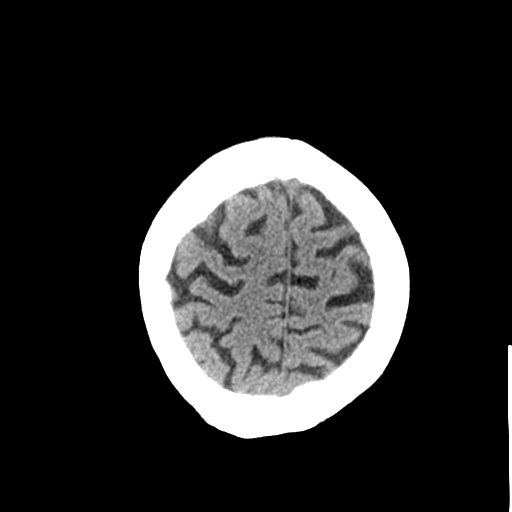
[im 26/32  brain]
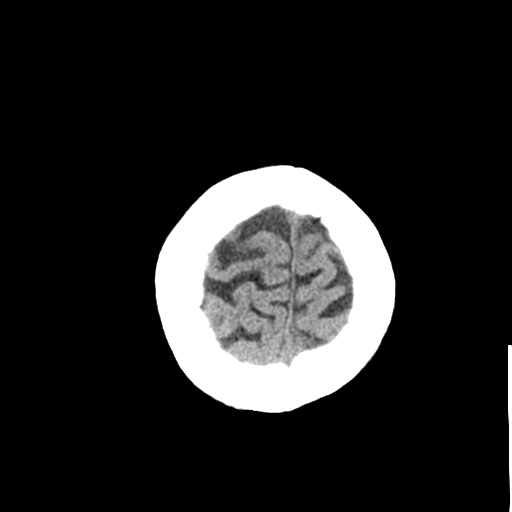
[im 26/32  bone]
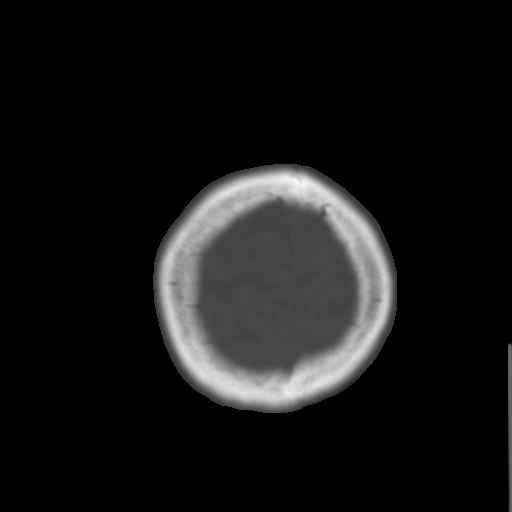
[im 29/32  brain]
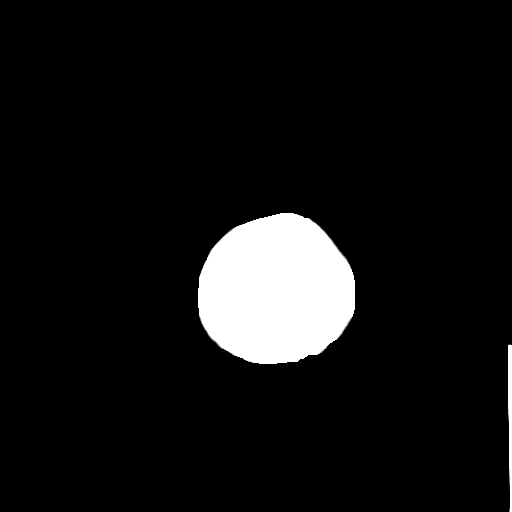

[Series 4: coronal soft · coronal · 0.33mm/px · 3 of 69 slices shown]
[im 23/69  brain]
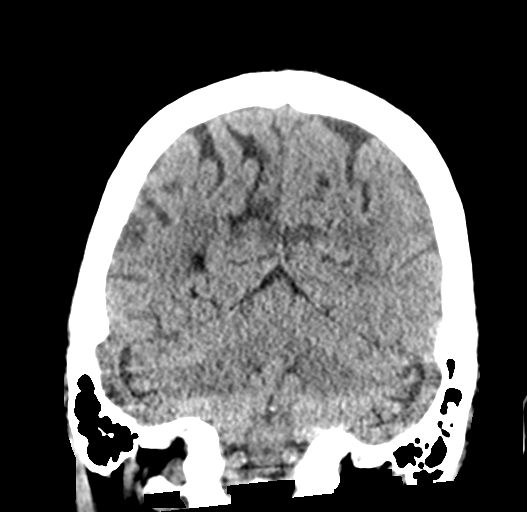
[im 31/69  brain]
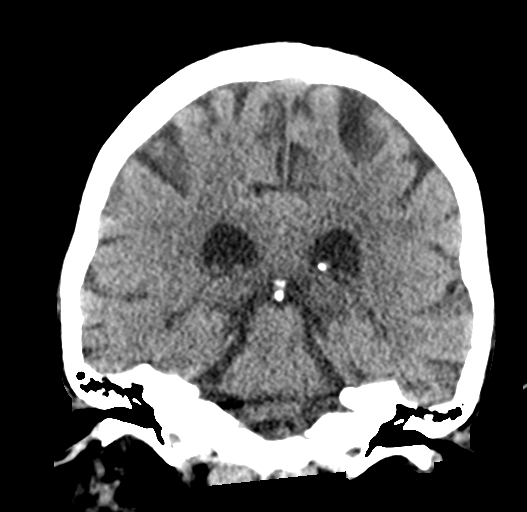
[im 38/69  brain]
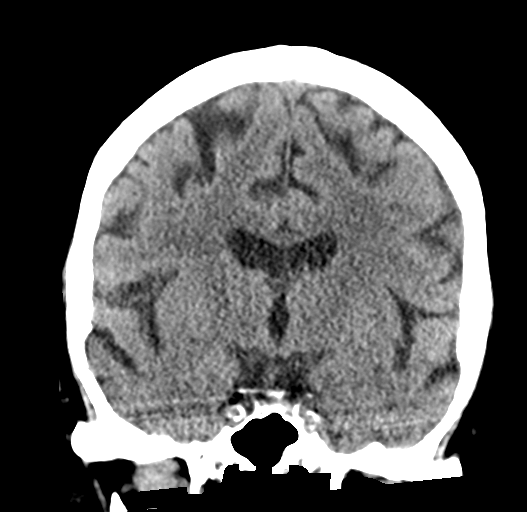

[Series 5: sagittal soft · sagittal · 0.33mm/px · 3 of 52 slices shown]
[im 18/52  brain]
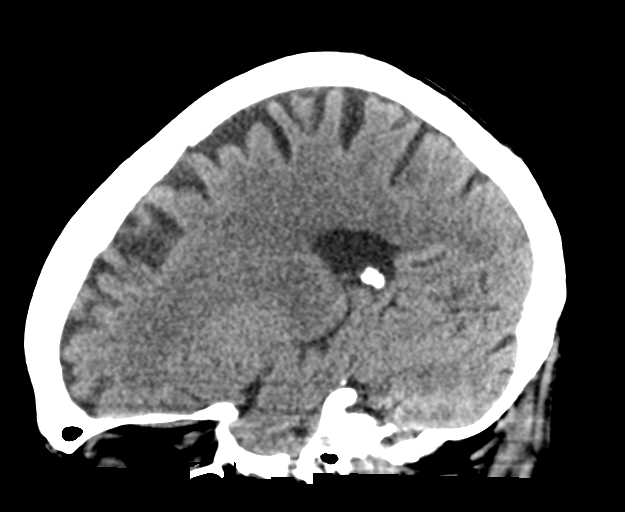
[im 26/52  brain]
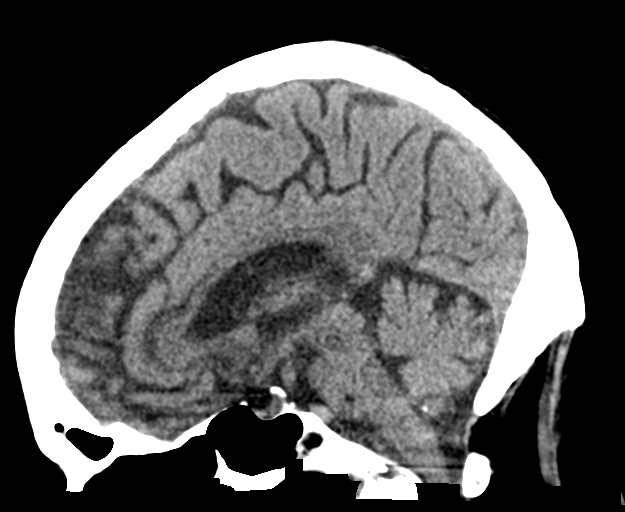
[im 35/52  brain]
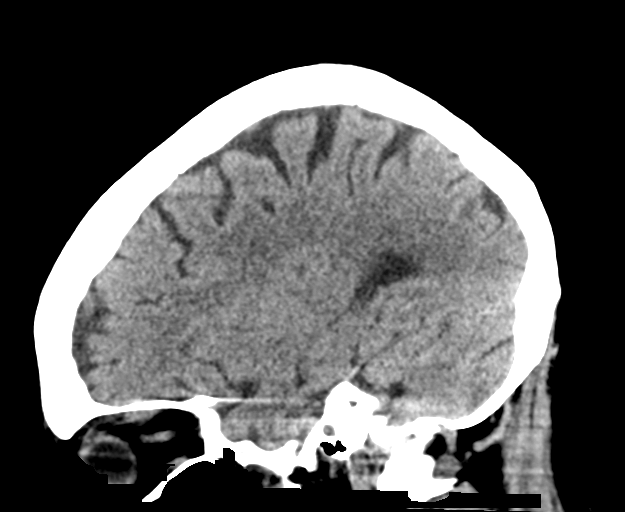

[16 of 47 positions shown; findings below may reference images not displayed]

FINDINGS: Brain: Mild age related volume loss. No acute intracranial
abnormality. Specifically, no hemorrhage, hydrocephalus, mass
lesion, acute infarction, or significant intracranial injury.

Vascular: No hyperdense vessel or unexpected calcification.

Skull: No acute calvarial abnormality.

Sinuses/Orbits: No acute findings

Other: None
IMPRESSION: No acute intracranial abnormality.

## 2021-05-19 MED ORDER — ACETAMINOPHEN 325 MG PO TABS: 650.0000 mg | ORAL_TABLET | Freq: Four times a day (QID) | ORAL | Status: DC | PRN

## 2021-05-19 MED ORDER — OCTREOTIDE ACETATE 500 MCG/ML IJ SOLN
INTRAMUSCULAR | Status: AC
  Filled 2021-05-19: qty 1

## 2021-05-19 MED ORDER — ONDANSETRON HCL 4 MG/2ML IJ SOLN
4.0000 mg | Freq: Once | INTRAMUSCULAR | Status: AC
  Administered 2021-05-19: 4 mg via INTRAVENOUS
  Filled 2021-05-19: qty 2

## 2021-05-19 MED ORDER — SODIUM CHLORIDE 0.9 % IV SOLN
80.0000 mg | Freq: Once | INTRAVENOUS | Status: AC
  Administered 2021-05-19: 80 mg via INTRAVENOUS
  Filled 2021-05-19: qty 80

## 2021-05-19 MED ORDER — LACTULOSE ENEMA
300.0000 mL | Freq: Once | ORAL | Status: AC
  Administered 2021-05-20: 300 mL via RECTAL
  Filled 2021-05-19: qty 300

## 2021-05-19 MED ORDER — METOCLOPRAMIDE HCL 5 MG/ML IJ SOLN: 10.0000 mg | Freq: Once | INTRAMUSCULAR | Status: DC

## 2021-05-19 MED ORDER — SODIUM CHLORIDE 0.9 % IV SOLN: INTRAVENOUS | Status: DC

## 2021-05-19 MED ORDER — ONDANSETRON HCL 4 MG/2ML IJ SOLN
4.0000 mg | Freq: Four times a day (QID) | INTRAMUSCULAR | Status: DC | PRN
  Administered 2021-05-20: 4 mg via INTRAVENOUS
  Filled 2021-05-19: qty 2

## 2021-05-19 MED ORDER — ACETAMINOPHEN 650 MG RE SUPP: 650.0000 mg | Freq: Four times a day (QID) | RECTAL | Status: DC | PRN

## 2021-05-19 MED ORDER — SODIUM CHLORIDE 0.9 % IV SOLN
2.0000 g | INTRAVENOUS | Status: DC
  Administered 2021-05-19: 2 g via INTRAVENOUS
  Filled 2021-05-19: qty 20

## 2021-05-19 MED ORDER — PANTOPRAZOLE SODIUM 40 MG IV SOLR
40.0000 mg | Freq: Two times a day (BID) | INTRAVENOUS | Status: DC
  Administered 2021-05-23 – 2021-05-24 (×4): 40 mg via INTRAVENOUS
  Filled 2021-05-19 (×5): qty 40

## 2021-05-19 MED ORDER — VITAMIN K1 10 MG/ML IJ SOLN
5.0000 mg | Freq: Once | INTRAVENOUS | Status: AC
  Administered 2021-05-19: 5 mg via INTRAVENOUS
  Filled 2021-05-19: qty 0.5

## 2021-05-19 MED ORDER — VITAMIN K1 10 MG/ML IJ SOLN
INTRAMUSCULAR | Status: AC
  Filled 2021-05-19: qty 1

## 2021-05-19 MED ORDER — SODIUM CHLORIDE 0.9 % IV SOLN
8.0000 mg/h | INTRAVENOUS | Status: AC
  Administered 2021-05-19 – 2021-05-22 (×7): 8 mg/h via INTRAVENOUS
  Filled 2021-05-19 (×11): qty 80

## 2021-05-19 MED ORDER — INSULIN ASPART 100 UNIT/ML IJ SOLN
0.0000 [IU] | INTRAMUSCULAR | Status: DC
  Administered 2021-05-19: 3 [IU] via SUBCUTANEOUS
  Administered 2021-05-20: 5 [IU] via SUBCUTANEOUS
  Administered 2021-05-20: 3 [IU] via SUBCUTANEOUS
  Administered 2021-05-20: 5 [IU] via SUBCUTANEOUS
  Administered 2021-05-20 – 2021-05-22 (×4): 3 [IU] via SUBCUTANEOUS
  Administered 2021-05-23 (×2): 2 [IU] via SUBCUTANEOUS
  Administered 2021-05-23: 3 [IU] via SUBCUTANEOUS
  Administered 2021-05-23 – 2021-05-24 (×5): 2 [IU] via SUBCUTANEOUS
  Administered 2021-05-25: 8 [IU] via SUBCUTANEOUS
  Administered 2021-05-25 (×2): 5 [IU] via SUBCUTANEOUS
  Administered 2021-05-25: 3 [IU] via SUBCUTANEOUS
  Administered 2021-05-26: 8 [IU] via SUBCUTANEOUS
  Administered 2021-05-26: 5 [IU] via SUBCUTANEOUS
  Administered 2021-05-26: 3 [IU] via SUBCUTANEOUS
  Administered 2021-05-26: 11 [IU] via SUBCUTANEOUS
  Administered 2021-05-26: 5 [IU] via SUBCUTANEOUS

## 2021-05-19 MED ORDER — ONDANSETRON HCL 4 MG PO TABS: 4.0000 mg | ORAL_TABLET | Freq: Four times a day (QID) | ORAL | Status: DC | PRN

## 2021-05-19 MED ORDER — SODIUM CHLORIDE 0.9 % IV SOLN
50.0000 ug/h | INTRAVENOUS | Status: DC
  Administered 2021-05-19 – 2021-05-23 (×8): 50 ug/h via INTRAVENOUS
  Filled 2021-05-19 (×14): qty 1

## 2021-05-19 MED ORDER — OCTREOTIDE LOAD VIA INFUSION
50.0000 ug | Freq: Once | INTRAVENOUS | Status: AC
  Administered 2021-05-20: 50 ug via INTRAVENOUS
  Filled 2021-05-19: qty 25

## 2021-05-19 NOTE — ED Triage Notes (Signed)
Pt to er via ems, per ems pt had one episode of emesis with blood in it.  Pt has picc line in R arm.  Pt states that she woke up this am and had some nausea, states that she vomited some bright red blood, pt reports dizziness when standing, states that she has the picc line for abx due to a partial amputation of her L foot.

## 2021-05-19 NOTE — ED Provider Notes (Signed)
[facility] EMERGENCY DEPARTMENT Provider Note   CSN: [id number] Arrival date & time: 05/19/21  1217     History Chief Complaint  Patient presents with  . Hematemesis    Ariel Arnold is a 65 y.o. female.  Patient with history of recent left partial amputation, presents with episode of vomiting at home today prior to arrival.  States it occurred early in the morning hours, she vomited once and noticed there was moderate amount of blood in it, she thinks may be a spoonful or 2.  Describes it as being red in color.  No additional vomiting episodes reported.  No diarrhea reported.  Currently denies any headache or chest pain or abdominal pain.  No reports of fevers or cough.        Past Medical History:  Diagnosis Date  . Anemia   . Aortic atherosclerosis (HCC)   . AVM (arteriovenous malformation) of colon   . Essential hypertension   . GI bleeding    Recurrent  . Hepatic encephalopathy (HCC)   . History of RSV infection   . Liver cirrhosis secondary to NASH (HCC) 2004   Biopsy-proven  . Obesity   . Osteomyelitis (HCC)   . Thrombocytopenia (HCC)   . Type 2 diabetes mellitus (HCC)     There are no problems to display for this patient.   Past Surgical History:  Procedure Laterality Date  . ABDOMINAL HYSTERECTOMY    . COLONOSCOPY    . Cystoscopy with ureteral stent    . ESOPHAGOGASTRODUODENOSCOPY    . Toe amputations Bilateral   . TUBAL LIGATION       OB History   No obstetric history on file.     Family History  Problem Relation Age of Onset  . Heart failure Mother   . Hyperlipidemia Mother   . Diabetes Mellitus II Mother   . Heart failure Father   . Hyperlipidemia Father     Social History   Tobacco Use  . Smoking status: Former Smoker    Types: Cigarettes  . Smokeless tobacco: Never Used  Vaping Use  . Vaping Use: Never used  Substance Use Topics  . Alcohol use: Yes  . Drug use: Never    Home Medications Prior to Admission medications    Medication Sig Start Date End Date Taking? Authorizing Provider  albuterol (VENTOLIN HFA) 108 (90 Base) MCG/ACT inhaler Inhale 2 puffs into the lungs every 4 (four) hours as needed. 01/11/21   [provider]  benzonatate (TESSALON) 100 MG capsule Take 100 mg by mouth 3 (three) times daily as needed. 02/05/21   [provider]  carvedilol (COREG) 3.125 MG tablet Take 3.125 mg by mouth 2 (two) times daily. 02/20/21   [provider]  clotrimazole-betamethasone (LOTRISONE) cream Apply topically 2 (two) times daily. 02/20/21   [provider]  furosemide (LASIX) 20 MG tablet Take 1 tablet by mouth daily. 03/21/21   [provider]  hydrOXYzine (ATARAX/VISTARIL) 25 MG tablet Take 25 mg by mouth 3 (three) times daily. 02/20/21   [provider]  insulin glargine, 2 Unit Dial, (TOUJEO MAX SOLOSTAR) 300 UNIT/ML Solostar Pen 60 Units daily. 06/26/20   [provider]  lactulose (CHRONULAC) 10 GM/15ML solution Take 20 g by mouth 2 (two) times daily. 03/21/21   [provider]  pantoprazole (PROTONIX) 40 MG tablet Take 1 tablet by mouth 2 (two) times daily. 03/21/21   [provider]  pravastatin (PRAVACHOL) 20 MG tablet Take 20 mg by  mouth at bedtime. 12/14/20   [provider]  sertraline (ZOLOFT) 100 MG tablet Take 1.5 tablets by mouth daily. 03/09/21   [provider]  spironolactone (ALDACTONE) 50 MG tablet Take 1 tablet by mouth daily. 03/21/21   [provider]    Allergies    Patient has no known allergies.  Review of Systems   Review of Systems  Constitutional: Negative for fever.  HENT: Negative for ear pain.   Eyes: Negative for pain.  Respiratory: Negative for cough.   Cardiovascular: Negative for chest pain.  Gastrointestinal: Negative for abdominal pain.  Genitourinary: Negative for flank pain.  Musculoskeletal: Negative for back pain.  Skin: Negative for rash.  Neurological: Negative for  headaches.    Physical Exam Updated Vital Signs BP (!) 108/46   Pulse 99   Temp 98 F (36.7 C) (Oral)   Resp 15   Ht 5\' 8"  (1.727 m)   Wt 79.4 kg   SpO2 96%   BMI 26.61 kg/m   Physical Exam Constitutional:      General: She is not in acute distress.    Appearance: Normal appearance.  HENT:     Head: Normocephalic.     Nose: Nose normal.  Eyes:     Extraocular Movements: Extraocular movements intact.  Cardiovascular:     Rate and Rhythm: Normal rate.  Pulmonary:     Effort: Pulmonary effort is normal.  Musculoskeletal:        General: Normal range of motion.     Cervical back: Normal range of motion.  Neurological:     General: No focal deficit present.     Mental Status: She is alert. Mental status is at baseline.     ED Results / Procedures / Treatments   Labs (all labs ordered are listed, but only abnormal results are displayed) Labs Reviewed  CBC WITH DIFFERENTIAL/PLATELET  PROTIME-INR  COMPREHENSIVE METABOLIC PANEL  LIPASE, BLOOD    EKG None  Radiology No results found.  Procedures Procedures   Medications Ordered in ED Medications  ondansetron (ZOFRAN) injection 4 mg (has no administration in time range)    ED Course  I have reviewed the triage vital signs and the nursing notes.  Pertinent labs & imaging results that were available during my care of the patient were reviewed by me and considered in my medical decision making (see chart for details).    MDM Rules/Calculators/A&P                          Patient currently well-appearing has no complaints of pain or discomfort.  No additional vomiting noted in the ER.  Vital signs show mild tachycardia otherwise normal blood pressure.  Labs are sent and pending, will be signed out to oncoming physician provider.  Final Clinical Impression(s) / ED Diagnoses Final diagnoses:  Hematemesis, presence of nausea not specified    Rx / DC Orders ED Discharge Orders    None       , MD 05/19/21 1521

## 2021-05-19 NOTE — ED Notes (Signed)
Pt able to use the bedside commode.

## 2021-05-19 NOTE — H&P (Addendum)
History and Physical    Ariel Arnold MVH:[id number] DOB: 1956/07/19 DOA: 05/19/2021  PCP: Joaquin Courts, DO   Patient coming from: Home  I have personally briefly reviewed patient's old medical records in [facility]  Chief Complaint: Vomiting blood  HPI: Ariel Arnold is a 65 y.o. female with medical history significant for liver cirrhosis 2/2 NASH, hepatic encephalopathy, hypertension, thrombocytopenia, diabetes mellitus, osteomyelitis. Patient presented to the ED with complaints of vomiting blood today and dizziness.  She tells me she has had 2 episodes today.  She reports black stools.  She is unable to tell me if she has had blood in her stools.  Patient appears a little confused, but she answers most questions.  She tells me she takes tramadol for pain, she was unable to confirm or refute NSAID use.  Patient mention the history of esophageal varices.  Patient has a PICC line for which she is receiving antibiotics for left foot.  Recently hospitalized 5/2-5/6 at [facility] osteomyelitis of the left foot, for which patient declined surgery, opting to try for IV antibiotics and hyperbaric oxygen.  Patient was to complete 6 weeks of IV vancomycin and cefepime.  And was referred to [facility] for hyperbarics.  Also received 1 units of blood for hemoglobin of less than 7, without evidence of acute blood loss.  Patient receives IV iron infusion at the outpatient hematology center.    ED Course: Blood pressure systolic 110s to 841L.  Heart rate 80s to 90s.  Hemoglobin 8.  INR 1.7.  Platelets 62.  WBC 2.7. EDP Dr. Levon Hedger, who recommended starting octreotide, Protonix drip, IV ceftriaxone.  Review of Systems: As per HPI all other systems reviewed and negative.  Past Medical History:  Diagnosis Date  . Anemia   . Aortic atherosclerosis (HCC)   . AVM (arteriovenous malformation) of colon   . Essential hypertension   . GI bleeding    Recurrent  . Hepatic encephalopathy (HCC)    . History of RSV infection   . Liver cirrhosis secondary to NASH (HCC) 2004   Biopsy-proven  . Obesity   . Osteomyelitis (HCC)   . Thrombocytopenia (HCC)   . Type 2 diabetes mellitus (HCC)     Past Surgical History:  Procedure Laterality Date  . ABDOMINAL HYSTERECTOMY    . COLONOSCOPY    . Cystoscopy with ureteral stent    . ESOPHAGOGASTRODUODENOSCOPY    . Toe amputations Bilateral   . TUBAL LIGATION       reports that she has quit smoking. Her smoking use included cigarettes. She has never used smokeless tobacco. She reports current alcohol use. She reports that she does not use drugs.  No Known Allergies  Family History  Problem Relation Age of Onset  . Heart failure Mother   . Hyperlipidemia Mother   . Diabetes Mellitus II Mother   . Heart failure Father   . Hyperlipidemia Father     Prior to Admission medications   Medication Sig Start Date End Date Taking? Authorizing Provider  albuterol (VENTOLIN HFA) 108 (90 Base) MCG/ACT inhaler Inhale 2 puffs into the lungs every 4 (four) hours as needed. 01/11/21   [provider]  benzonatate (TESSALON) 100 MG capsule Take 100 mg by mouth 3 (three) times daily as needed. 02/05/21   [provider]  carvedilol (COREG) 3.125 MG tablet Take 3.125 mg by mouth 2 (two) times daily. 02/20/21   [provider]  clotrimazole-betamethasone (LOTRISONE) cream Apply topically 2 (two) times  daily. 02/20/21   [provider]  furosemide (LASIX) 20 MG tablet Take 1 tablet by mouth daily. 03/21/21   [provider]  hydrOXYzine (ATARAX/VISTARIL) 25 MG tablet Take 25 mg by mouth 3 (three) times daily. 02/20/21   [provider]  insulin glargine, 2 Unit Dial, (TOUJEO MAX SOLOSTAR) 300 UNIT/ML Solostar Pen 60 Units daily. 06/26/20   [provider]  lactulose (CHRONULAC) 10 GM/15ML solution Take 20 g by mouth 2 (two) times daily. 03/21/21   [provider]  pantoprazole (PROTONIX) 40 MG  tablet Take 1 tablet by mouth 2 (two) times daily. 03/21/21   [provider]  pravastatin (PRAVACHOL) 20 MG tablet Take 20 mg by mouth at bedtime. 12/14/20   [provider]  sertraline (ZOLOFT) 100 MG tablet Take 1.5 tablets by mouth daily. 03/09/21   [provider]  spironolactone (ALDACTONE) 50 MG tablet Take 1 tablet by mouth daily. 03/21/21   [provider]    Physical Exam: Vitals:   05/19/21 1830 05/19/21 1900 05/19/21 2009 05/19/21 2045  BP: (!) 116/40 (!) 107/56 135/71 (!) 117/58  Pulse:   99 96  Resp:   19 18  Temp:    98 F (36.7 C)  TempSrc:      SpO2:   98% 98%  Weight:      Height:        Constitutional: calm, comfortable Vitals:   05/19/21 1830 05/19/21 1900 05/19/21 2009 05/19/21 2045  BP: (!) 116/40 (!) 107/56 135/71 (!) 117/58  Pulse:   99 96  Resp:   19 18  Temp:    98 F (36.7 C)  TempSrc:      SpO2:   98% 98%  Weight:      Height:       Eyes: PERRL, lids and conjunctivae normal ENMT: Mucous membranes are moist.   Neck: normal, supple, no masses, no thyromegaly Respiratory: clear to auscultation bilaterally, no wheezing, no crackles. Normal respiratory effort. No accessory muscle use.  Cardiovascular: Regular rate and rhythm, no murmurs / rubs / gallops. No extremity edema. 2+ pedal pulses.  Abdomen: no tenderness, no masses palpated. No hepatosplenomegaly. Bowel sounds positive.  Musculoskeletal: no clubbing / cyanosis.  Left ray amputation Good ROM, no contractures. Normal muscle tone.  Skin: Large full-thickness clean ulcer to inferior medial aspect of stump of the fifth ray amputation, no drainage, does not appear infected, no rashes, lesions No induration Neurologic: No apparent cranial abnormality, moving all extremities spontaneously. Psychiatric: Appears slightly confused, with slowed mentation, but oriented to person place time and situation.  Labs on Admission: I have personally reviewed following labs and  imaging studies  CBC: Recent Labs  Lab 05/19/21 1525  WBC 2.7*  NEUTROABS 1.7  HGB 8.0*  HCT 26.5*  MCV 98.1  PLT 62*   Basic Metabolic Panel: Recent Labs  Lab 05/19/21 1525  NA 138  K 5.1  CL 106  CO2 25  GLUCOSE 319*  BUN 32*  CREATININE 0.72  CALCIUM 8.6*   Liver Function Tests: Recent Labs  Lab 05/19/21 1525  AST 71*  ALT 41  ALKPHOS 107  BILITOT 1.6*  PROT 5.7*  ALBUMIN 2.3*   Recent Labs  Lab 05/19/21 1525  LIPASE 24   Coagulation Profile: Recent Labs  Lab 05/19/21 1525  INR 1.7*    Radiological Exams on Admission: DG Ribs Unilateral Right  Result Date: 05/19/2021 CLINICAL DATA:  Right-sided rib pain following fall, initial encounter EXAM: RIGHT RIBS -  2 VIEW COMPARISON:  None. FINDINGS: Mild irregularity of the anterior aspect of the right fifth through seventh ribs is noted consistent with acute fracture. No pneumothorax is seen. No other focal abnormality is noted. IMPRESSION: Right-sided rib fractures as described without complicating factors. Electronically Signed   By: Alcide Clever M.D.   On: 05/19/2021 17:59   DG Ribs Unilateral W/Chest Left  Result Date: 05/19/2021 CLINICAL DATA:  Recent fall with bilateral rib pain, initial encounter EXAM: LEFT RIBS AND CHEST - 3+ VIEW COMPARISON:  None. FINDINGS: Minimally displaced left fifth rib fracture is noted anteriorly. No other fractures are seen. No pneumothorax is noted. Right-sided PICC line is noted at the junction of the innominate veins. IMPRESSION: Left fifth rib fracture without complicating factors. Electronically Signed   By: Alcide Clever M.D.   On: 05/19/2021 17:58   CT Head Wo Contrast  Result Date: 05/19/2021 CLINICAL DATA:  Head trauma EXAM: CT HEAD WITHOUT CONTRAST TECHNIQUE: Contiguous axial images were obtained from the base of the skull through the vertex without intravenous contrast. COMPARISON:  04/01/2021 FINDINGS: Brain: Mild age related volume loss. No acute intracranial  abnormality. Specifically, no hemorrhage, hydrocephalus, mass lesion, acute infarction, or significant intracranial injury. Vascular: No hyperdense vessel or unexpected calcification. Skull: No acute calvarial abnormality. Sinuses/Orbits: No acute findings Other: None IMPRESSION: No acute intracranial abnormality. Electronically Signed   By: Charlett Nose M.D.   On: 05/19/2021 17:23   DG Knee Complete 4 Views Right  Result Date: 05/19/2021 CLINICAL DATA:  Recent fall with right knee pain, initial encounter EXAM: RIGHT KNEE - COMPLETE 4+ VIEW COMPARISON:  None. FINDINGS: Meniscal calcification is identified. No fracture or dislocation is seen. No joint effusion is noted. Mild soft tissue swelling is noted anteriorly consistent with the recent injury. IMPRESSION: Soft tissue swelling without acute bony abnormality. Electronically Signed   By: Alcide Clever M.D.   On: 05/19/2021 17:56    EKG: None  Assessment/Plan Principal Problem:   Acute GI bleeding Active Problems:   Liver cirrhosis secondary to NASH (HCC)   Hepatic encephalopathy (HCC)   Thrombocytopenia (HCC)   Acute osteomyelitis of left foot (HCC)    Acute upper GI bleed- 2 episodes of hematemesis, vitals stable, hemoglobin stable at 8, baseline 7-8. Per care with by EGD 03/17/2021- No active bleeding or stigmata of recent  bleeding , 1 small esophageal varices in distal esophagus, diffuse erythematous mucosa in the entire stomach suggestive of gastritis, a large nonbleeding gastric varices with in cardia/fundus.  Patient had a colonoscopy earlier that month that showed right-sided AVM, it was suspected that patient had multiple AVMs.  -Monitor hemoglobin every 6 hourly x4  - Transfuse for hemoglobin less than 8 -EDP talked to gastroenterologist Dr. Levon Hedger to see in consult  -Continue IV Protonix -Continue IV octreotide - IV ceftriaxone - N/s 75cc/hr -Remain n.p.o.  Liver cirrhosis secondary to NASH with history of hepatic  encephalopathy-mentation slowed today, but answering questions.  INR- 1.7.  She tells me she takes her lactulose twice a day, and has 3-4 bowel movements daily. -Check ammonia -N.p.o., hence hold oral lactulose, will give 1 dose of lactulose enema -IV vitamin K 5 mg x 1  Thrombocytopenia platelets 62.  Per care everywhere recent check 78 - 102.  -Trend  Left foot osteomyelitis-to complete 6 weeks of IV vancomycin and cefepime + apparent treatment started during recent hospitalization at [facility] 5/2 - 5/6. -Continue antibiotics, pharmacy to dose -Check Vanco trough  Left foot ray amputation  DVT prophylaxis: SCDs Code Status: Full code Family Communication: None at bedside Disposition Plan: ~ 2 days Consults called: GI Admission status: Obs Tele  Onnie BoerEjiroghene E Theoden Mauch MD [facility]  05/19/2021, 10:08 PM

## 2021-05-19 NOTE — ED Notes (Signed)
Pt verbalized she has been eating and drinking and able to hold it down.

## 2021-05-19 NOTE — ED Provider Notes (Signed)
Patient turned over for hematemesis.  Patient's daughter was able to describe a lot of blood at home.  Hemoccult is black in color here and heme positive.  Hemoglobin is at 8.0.  Patient states that she had a GI bleed several years ago was at [facility].  Upper endoscopy was done seem to mention may be esophageal varices.  Patient has denies being a heavy drinker.  COVID testing negative.  Platelet count is down a little bit at 62,000.  INR is 1.7.  Patient not on blood thinners.  Liver function test without significant abnormalities other than total bili is 1.6.  Lipase normal  And also gave me a story of falls.  Was resulted in x-ray of the right knee bilateral ribs and head CT.  There are some soft tissue swelling of the right knee but no acute bony abnormality.  Patient able to bend the leg at the knee fine.  There is evidence of bilateral rib fractures left fifth rib and right 5 through 7 ribs.  No underlying lung abnormality.  Type and screen has been ordered.  But have not ordered any blood transfusion at this time.  Would recommend probably incentive spirometry for the rib fractures.  Patient needs admission for the hematemesis in the melena.  Will also consult gastroenterology.  And will consult hospitalist for admission.  CRITICAL CARE Performed by: Vanetta Mulders Total critical care time: 35 minutes Critical care time was exclusive of separately billable procedures and treating other patients. Critical care was necessary to treat or prevent imminent or life-threatening deterioration. Critical care was time spent personally by me on the following activities: development of treatment plan with patient and/or surrogate as well as nursing, discussions with consultants, evaluation of patient's response to treatment, examination of patient, obtaining history from patient or surrogate, ordering and performing treatments and interventions, ordering and review of laboratory studies, ordering and review  of radiographic studies, pulse oximetry and re-evaluation of patient's condition.    Vanetta Mulders, MD 05/19/21 [id number]

## 2021-05-19 NOTE — ED Notes (Signed)
No vomiting noted since in this room.

## 2021-05-19 NOTE — ED Notes (Signed)
PICC line noted to right arm , One lumen dated 05/17/21. Pt verbalized home health nurse came on Monday and will be back Thurs to give ABT.

## 2021-05-20 ENCOUNTER — Encounter (HOSPITAL_COMMUNITY)
Admission: EM | Disposition: A | Discharge status: Home Health | Payer: Self-pay | Source: Home / Self Care | Attending: Critical Care Medicine

## 2021-05-20 ENCOUNTER — Observation Stay (HOSPITAL_COMMUNITY): Payer: Medicare Other | Admitting: Anesthesiology

## 2021-05-20 ENCOUNTER — Inpatient Hospital Stay (HOSPITAL_COMMUNITY): Payer: Medicare Other

## 2021-05-20 ENCOUNTER — Encounter (HOSPITAL_COMMUNITY): Payer: Self-pay | Admitting: Internal Medicine

## 2021-05-20 DIAGNOSIS — Z20822 Contact with and (suspected) exposure to covid-19: Secondary | ICD-10-CM | POA: Diagnosis not present

## 2021-05-20 DIAGNOSIS — K920 Hematemesis: Secondary | ICD-10-CM | POA: Diagnosis not present

## 2021-05-20 DIAGNOSIS — M86172 Other acute osteomyelitis, left ankle and foot: Secondary | ICD-10-CM

## 2021-05-20 DIAGNOSIS — I864 Gastric varices: Secondary | ICD-10-CM | POA: Diagnosis not present

## 2021-05-20 DIAGNOSIS — K7460 Unspecified cirrhosis of liver: Secondary | ICD-10-CM | POA: Diagnosis present

## 2021-05-20 DIAGNOSIS — K7581 Nonalcoholic steatohepatitis (NASH): Secondary | ICD-10-CM

## 2021-05-20 DIAGNOSIS — E1122 Type 2 diabetes mellitus with diabetic chronic kidney disease: Secondary | ICD-10-CM

## 2021-05-20 DIAGNOSIS — D62 Acute posthemorrhagic anemia: Secondary | ICD-10-CM | POA: Diagnosis not present

## 2021-05-20 DIAGNOSIS — G9340 Encephalopathy, unspecified: Secondary | ICD-10-CM | POA: Diagnosis present

## 2021-05-20 DIAGNOSIS — D6959 Other secondary thrombocytopenia: Secondary | ICD-10-CM | POA: Diagnosis not present

## 2021-05-20 DIAGNOSIS — Z87891 Personal history of nicotine dependence: Secondary | ICD-10-CM | POA: Diagnosis not present

## 2021-05-20 DIAGNOSIS — K3189 Other diseases of stomach and duodenum: Secondary | ICD-10-CM | POA: Diagnosis present

## 2021-05-20 DIAGNOSIS — K2961 Other gastritis with bleeding: Secondary | ICD-10-CM | POA: Diagnosis not present

## 2021-05-20 DIAGNOSIS — Z89432 Acquired absence of left foot: Secondary | ICD-10-CM | POA: Diagnosis not present

## 2021-05-20 DIAGNOSIS — Z833 Family history of diabetes mellitus: Secondary | ICD-10-CM | POA: Diagnosis not present

## 2021-05-20 DIAGNOSIS — K922 Gastrointestinal hemorrhage, unspecified: Secondary | ICD-10-CM

## 2021-05-20 DIAGNOSIS — Z6826 Body mass index (BMI) 26.0-26.9, adult: Secondary | ICD-10-CM | POA: Diagnosis not present

## 2021-05-20 DIAGNOSIS — I10 Essential (primary) hypertension: Secondary | ICD-10-CM | POA: Diagnosis not present

## 2021-05-20 DIAGNOSIS — I8510 Secondary esophageal varices without bleeding: Secondary | ICD-10-CM | POA: Diagnosis not present

## 2021-05-20 DIAGNOSIS — D696 Thrombocytopenia, unspecified: Secondary | ICD-10-CM | POA: Diagnosis not present

## 2021-05-20 DIAGNOSIS — K7469 Other cirrhosis of liver: Secondary | ICD-10-CM | POA: Diagnosis not present

## 2021-05-20 DIAGNOSIS — I129 Hypertensive chronic kidney disease with stage 1 through stage 4 chronic kidney disease, or unspecified chronic kidney disease: Secondary | ICD-10-CM | POA: Diagnosis not present

## 2021-05-20 DIAGNOSIS — Z8249 Family history of ischemic heart disease and other diseases of the circulatory system: Secondary | ICD-10-CM | POA: Diagnosis not present

## 2021-05-20 DIAGNOSIS — I2510 Atherosclerotic heart disease of native coronary artery without angina pectoris: Secondary | ICD-10-CM | POA: Diagnosis present

## 2021-05-20 DIAGNOSIS — Z83438 Family history of other disorder of lipoprotein metabolism and other lipidemia: Secondary | ICD-10-CM | POA: Diagnosis not present

## 2021-05-20 DIAGNOSIS — J9601 Acute respiratory failure with hypoxia: Secondary | ICD-10-CM | POA: Diagnosis not present

## 2021-05-20 DIAGNOSIS — D509 Iron deficiency anemia, unspecified: Secondary | ICD-10-CM | POA: Diagnosis not present

## 2021-05-20 DIAGNOSIS — E785 Hyperlipidemia, unspecified: Secondary | ICD-10-CM | POA: Diagnosis present

## 2021-05-20 DIAGNOSIS — E669 Obesity, unspecified: Secondary | ICD-10-CM | POA: Diagnosis not present

## 2021-05-20 DIAGNOSIS — K7290 Hepatic failure, unspecified without coma: Secondary | ICD-10-CM | POA: Diagnosis not present

## 2021-05-20 DIAGNOSIS — Z794 Long term (current) use of insulin: Secondary | ICD-10-CM | POA: Diagnosis not present

## 2021-05-20 HISTORY — PX: ESOPHAGOGASTRODUODENOSCOPY (EGD) WITH PROPOFOL: SHX5813

## 2021-05-20 LAB — HEMOGLOBIN AND HEMATOCRIT, BLOOD
HCT: 24.4 % — ABNORMAL LOW (ref 36.0–46.0)
Hemoglobin: 7.3 g/dL — ABNORMAL LOW (ref 12.0–15.0)

## 2021-05-20 LAB — CBC
HCT: 23.3 % — ABNORMAL LOW (ref 36.0–46.0)
HCT: 22.6 % — ABNORMAL LOW (ref 36.0–46.0)
HCT: 24.1 % — ABNORMAL LOW (ref 36.0–46.0)
HCT: 27.4 % — ABNORMAL LOW (ref 36.0–46.0)
Hemoglobin: 6.7 g/dL — CL (ref 12.0–15.0)
Hemoglobin: 6.8 g/dL — CL (ref 12.0–15.0)
Hemoglobin: 7.3 g/dL — ABNORMAL LOW (ref 12.0–15.0)
Hemoglobin: 8.8 g/dL — ABNORMAL LOW (ref 12.0–15.0)
MCH: 29.6 pg (ref 26.0–34.0)
MCH: 29.8 pg (ref 26.0–34.0)
MCH: 30 pg (ref 26.0–34.0)
MCH: 29.7 pg (ref 26.0–34.0)
MCHC: 28.8 g/dL — ABNORMAL LOW (ref 30.0–36.0)
MCHC: 30.1 g/dL (ref 30.0–36.0)
MCHC: 30.3 g/dL (ref 30.0–36.0)
MCHC: 32.1 g/dL (ref 30.0–36.0)
MCV: 103.1 fL — ABNORMAL HIGH (ref 80.0–100.0)
MCV: 99.1 fL (ref 80.0–100.0)
MCV: 99.2 fL (ref 80.0–100.0)
MCV: 92.6 fL (ref 80.0–100.0)
Platelets: 61 10*3/uL — ABNORMAL LOW (ref 150–400)
Platelets: 70 10*3/uL — ABNORMAL LOW (ref 150–400)
Platelets: 93 10*3/uL — ABNORMAL LOW (ref 150–400)
Platelets: 91 10*3/uL — ABNORMAL LOW (ref 150–400)
RBC: 2.26 MIL/uL — ABNORMAL LOW (ref 3.87–5.11)
RBC: 2.28 MIL/uL — ABNORMAL LOW (ref 3.87–5.11)
RBC: 2.43 MIL/uL — ABNORMAL LOW (ref 3.87–5.11)
RBC: 2.96 MIL/uL — ABNORMAL LOW (ref 3.87–5.11)
RDW: 20.3 % — ABNORMAL HIGH (ref 11.5–15.5)
RDW: 20.4 % — ABNORMAL HIGH (ref 11.5–15.5)
RDW: 20.5 % — ABNORMAL HIGH (ref 11.5–15.5)
RDW: 21.1 % — ABNORMAL HIGH (ref 11.5–15.5)
WBC: 3.2 10*3/uL — ABNORMAL LOW (ref 4.0–10.5)
WBC: 3.8 10*3/uL — ABNORMAL LOW (ref 4.0–10.5)
WBC: 5.5 10*3/uL (ref 4.0–10.5)
WBC: 5.8 10*3/uL (ref 4.0–10.5)
nRBC: 0 % (ref 0.0–0.2)
nRBC: 0 % (ref 0.0–0.2)
nRBC: 0 % (ref 0.0–0.2)
nRBC: 0 % (ref 0.0–0.2)

## 2021-05-20 LAB — PROTIME-INR
INR: 1.6 — ABNORMAL HIGH (ref 0.8–1.2)
INR: 1.6 — ABNORMAL HIGH (ref 0.8–1.2)
Prothrombin Time: 19.3 seconds — ABNORMAL HIGH (ref 11.4–15.2)
Prothrombin Time: 19.3 seconds — ABNORMAL HIGH (ref 11.4–15.2)

## 2021-05-20 LAB — COMPREHENSIVE METABOLIC PANEL
ALT: 34 U/L (ref 0–44)
AST: 39 U/L (ref 15–41)
Albumin: 2.4 g/dL — ABNORMAL LOW (ref 3.5–5.0)
Alkaline Phosphatase: 96 U/L (ref 38–126)
Anion gap: 8 (ref 5–15)
BUN: 49 mg/dL — ABNORMAL HIGH (ref 8–23)
CO2: 23 mmol/L (ref 22–32)
Calcium: 8.2 mg/dL — ABNORMAL LOW (ref 8.9–10.3)
Chloride: 110 mmol/L (ref 98–111)
Creatinine, Ser: 0.84 mg/dL (ref 0.44–1.00)
GFR, Estimated: >60 mL/min (ref >60)
Glucose, Bld: 127 mg/dL — ABNORMAL HIGH (ref 70–99)
Potassium: 3.4 mmol/L — ABNORMAL LOW (ref 3.5–5.1)
Sodium: 141 mmol/L (ref 135–145)
Total Bilirubin: 1.6 mg/dL — ABNORMAL HIGH (ref 0.3–1.2)
Total Protein: 5.6 g/dL — ABNORMAL LOW (ref 6.5–8.1)

## 2021-05-20 LAB — VANCOMYCIN, TROUGH: Vancomycin Tr: 9 ug/mL — ABNORMAL LOW (ref 15–20)

## 2021-05-20 LAB — BASIC METABOLIC PANEL
Anion gap: 8 (ref 5–15)
BUN: 44 mg/dL — ABNORMAL HIGH (ref 8–23)
CO2: 23 mmol/L (ref 22–32)
Calcium: 8.5 mg/dL — ABNORMAL LOW (ref 8.9–10.3)
Chloride: 111 mmol/L (ref 98–111)
Creatinine, Ser: 0.75 mg/dL (ref 0.44–1.00)
GFR, Estimated: >60 mL/min (ref >60)
Glucose, Bld: 213 mg/dL — ABNORMAL HIGH (ref 70–99)
Potassium: 4.2 mmol/L (ref 3.5–5.1)
Sodium: 142 mmol/L (ref 135–145)

## 2021-05-20 LAB — GLUCOSE, CAPILLARY
Glucose-Capillary: 110 mg/dL — ABNORMAL HIGH (ref 70–99)
Glucose-Capillary: 118 mg/dL — ABNORMAL HIGH (ref 70–99)
Glucose-Capillary: 151 mg/dL — ABNORMAL HIGH (ref 70–99)
Glucose-Capillary: 177 mg/dL — ABNORMAL HIGH (ref 70–99)
Glucose-Capillary: 185 mg/dL — ABNORMAL HIGH (ref 70–99)
Glucose-Capillary: 205 mg/dL — ABNORMAL HIGH (ref 70–99)
Glucose-Capillary: 216 mg/dL — ABNORMAL HIGH (ref 70–99)

## 2021-05-20 LAB — PREPARE RBC (CROSSMATCH)

## 2021-05-20 LAB — FIBRINOGEN: Fibrinogen: 205 mg/dL — ABNORMAL LOW (ref 210–475)

## 2021-05-20 LAB — APTT: aPTT: 38 seconds — ABNORMAL HIGH (ref 24–36)

## 2021-05-20 LAB — ABO/RH: ABO/RH(D): A POS

## 2021-05-20 LAB — HIV ANTIBODY (ROUTINE TESTING W REFLEX): HIV Screen 4th Generation wRfx: NONREACTIVE

## 2021-05-20 LAB — AMMONIA
Ammonia: 94 umol/L — ABNORMAL HIGH (ref 9–35)
Ammonia: 53 umol/L — ABNORMAL HIGH (ref 9–35)

## 2021-05-20 IMAGING — DX DG CHEST 1V PORT
1 series · 1 of 1 positions shown · non-contrast
Comparison: Rib radiographs earlier today.

CLINICAL DATA: Central line placement.

EXAM:
PORTABLE CHEST 1 VIEW

[chest ap]
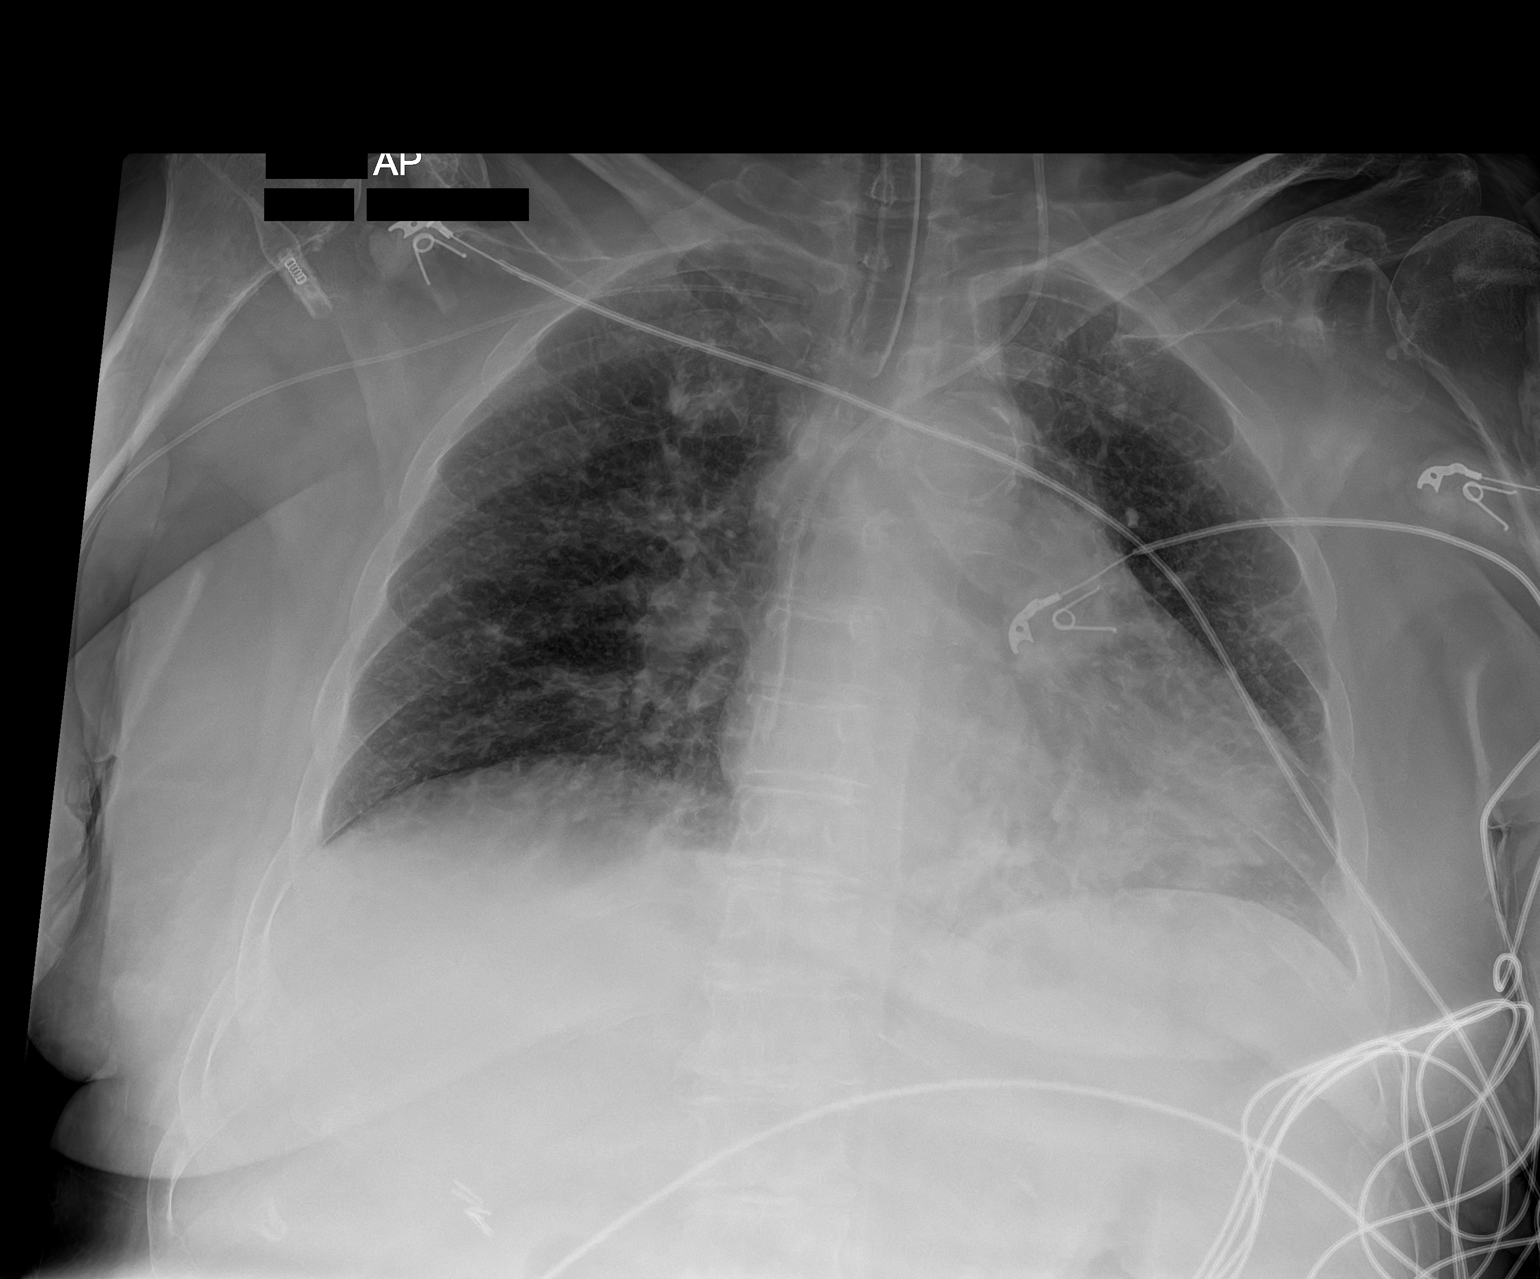

[1 of 1 positions shown; findings below may reference images not displayed]

FINDINGS: New left internal jugular central line tip at the atrial caval
junction. No pneumothorax. Right upper extremity central line tip
extends to the subclavian vein. Endotracheal tube is approximately
2.4 cm from the carina. Upper normal heart size. Aortic
atherosclerosis. Patchy retrocardiac opacity. No significant pleural
effusion. Right rib fractures are better seen on recent rib
radiographs.
IMPRESSION: 1. New left internal jugular central line with tip at the atrial
caval junction. No pneumothorax.
2. Right upper extremity central line with tip in the subclavian
vein. Endotracheal tube 2.4 cm from the carina.
3. Patchy retrocardiac opacity, favoring atelectasis.

## 2021-05-20 IMAGING — CT CT CTA ABD/PEL W/CM AND/OR W/O CM
3 of 9 series · 11 of 46 positions shown, 17 images · IV contrast (Omnipaque or Isovue)
Comparison: Prior CT a abdomen and pelvis [DATE]

CLINICAL DATA: 64-year-old female with NASH cirrhosis, portal
hypertension, hematemesis and melena.

EXAM:
CTA ABDOMEN AND PELVIS WITHOUT AND WITH CONTRAST
TECHNIQUE: Multidetector CT imaging of the abdomen and pelvis was performed
using the standard protocol during bolus administration of
intravenous contrast. Multiplanar reconstructed images and MIPs were
obtained and reviewed to evaluate the vascular anatomy.
CONTRAST:  100mL OMNIPAQUE IOHEXOL 350 MG/ML SOLN

[Series 4: brto arterial · axial · arterial · 0.82mm/px · z∈[-337,-193]mm · 4 of 163 slices shown]
[im 17/163  soft-tissue]
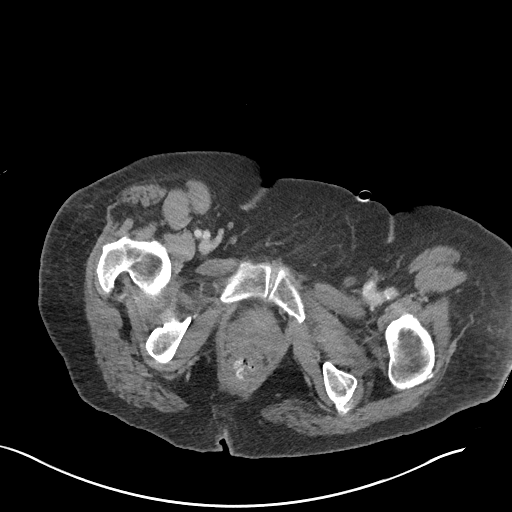
[im 33/163  soft-tissue]
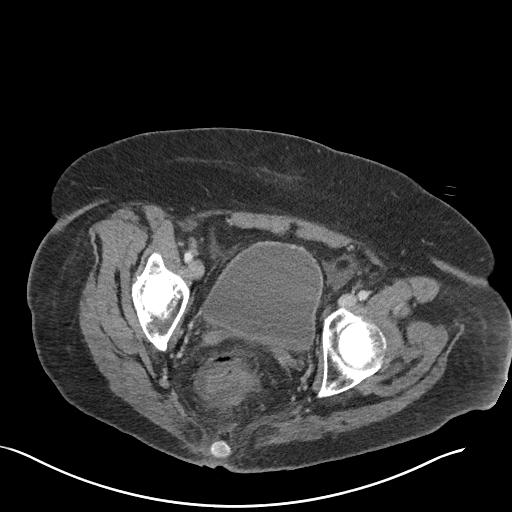
[im 49/163  soft-tissue]
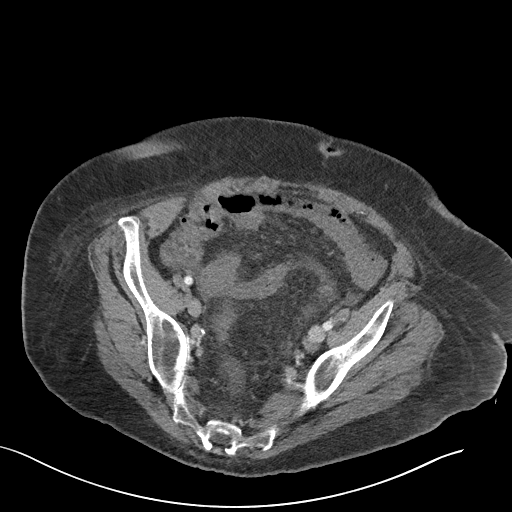
[im 65/163  soft-tissue]
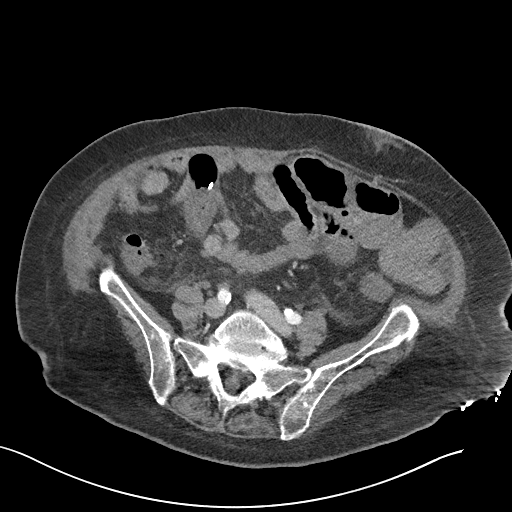

[Series 6: coronal arterial · coronal · arterial · 0.99mm/px · 3 of 108 slices shown, 4 images]
[im 27/108  soft-tissue]
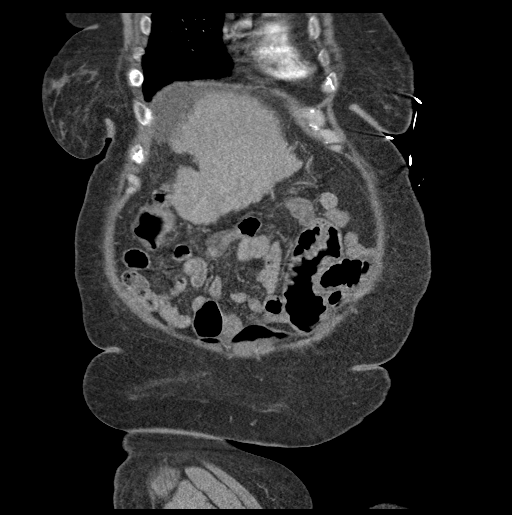
[im 54/108  soft-tissue]
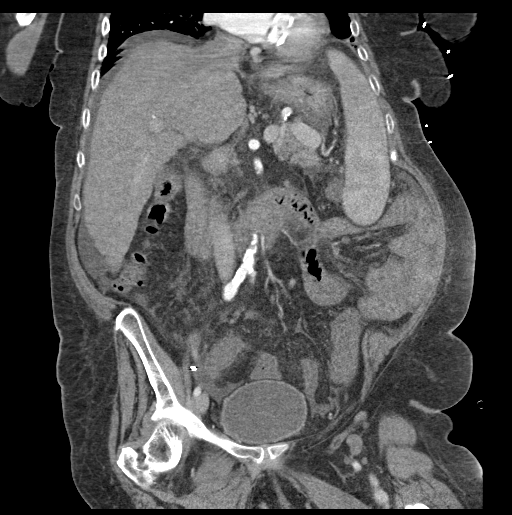
[im 54/108  bone]
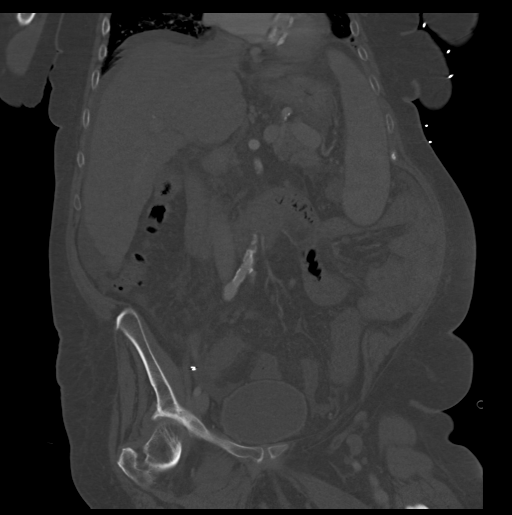
[im 81/108  soft-tissue]
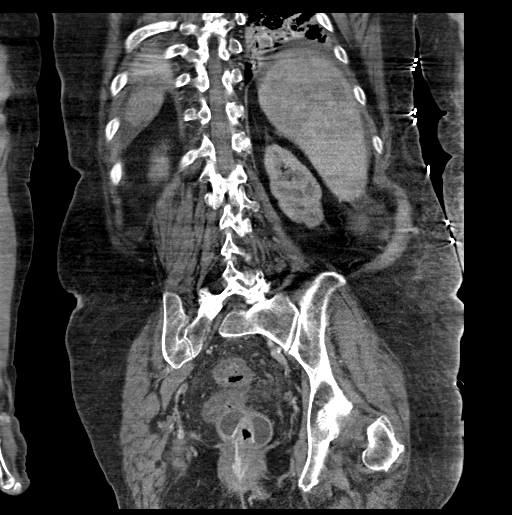

[Series 8: brto portal venous · axial · portal-venous · 0.82mm/px · z∈[-289,+1]mm · 4 of 98 slices shown, 9 images]
[im 20/98  soft-tissue]
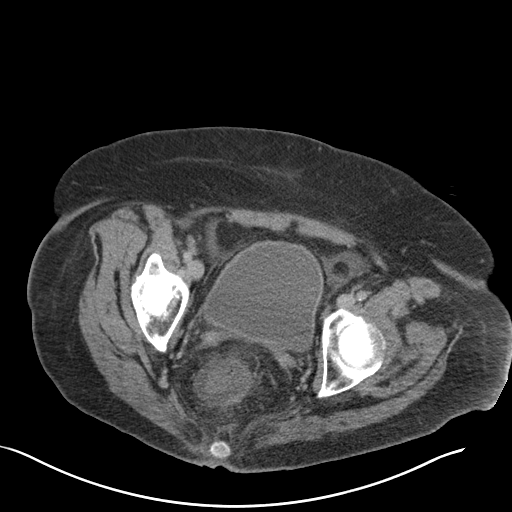
[im 20/98  lung]
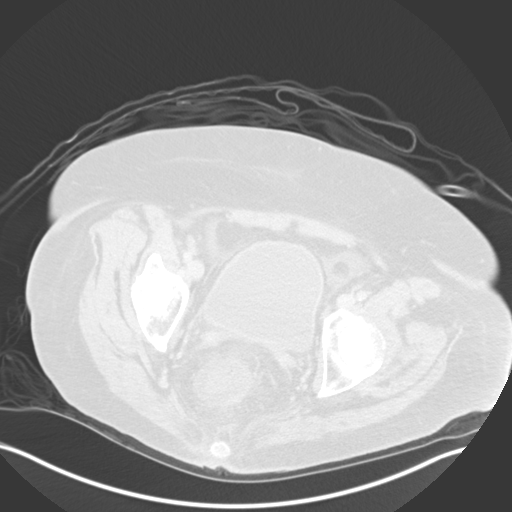
[im 20/98  bone]
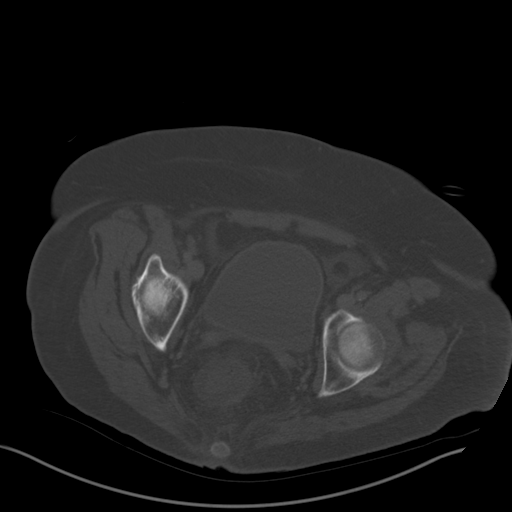
[im 39/98  soft-tissue]
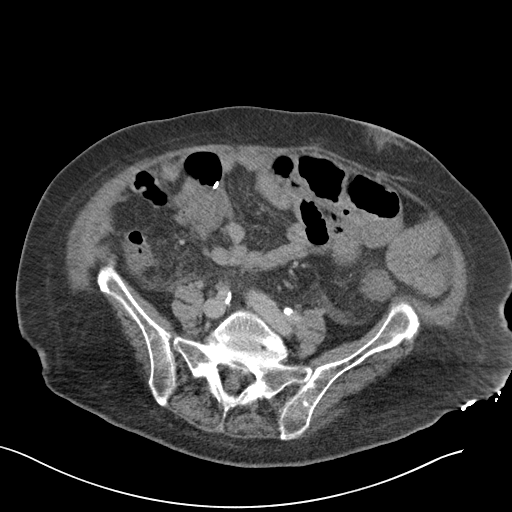
[im 39/98  lung]
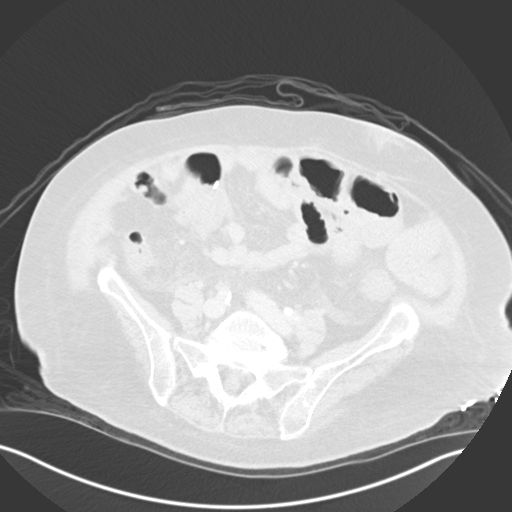
[im 59/98  soft-tissue]
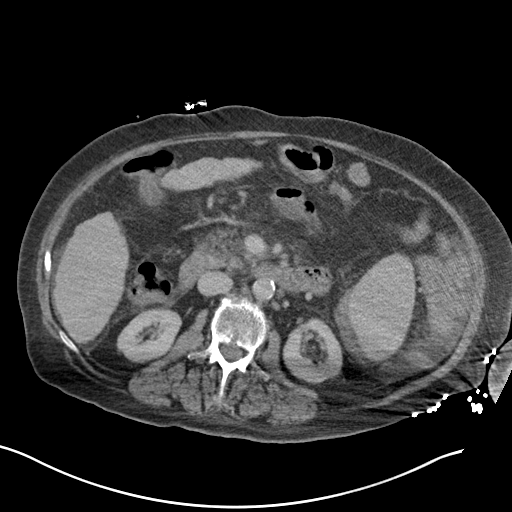
[im 59/98  lung]
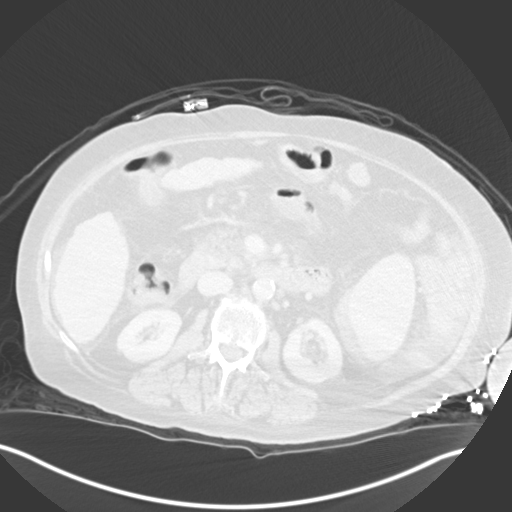
[im 78/98  soft-tissue]
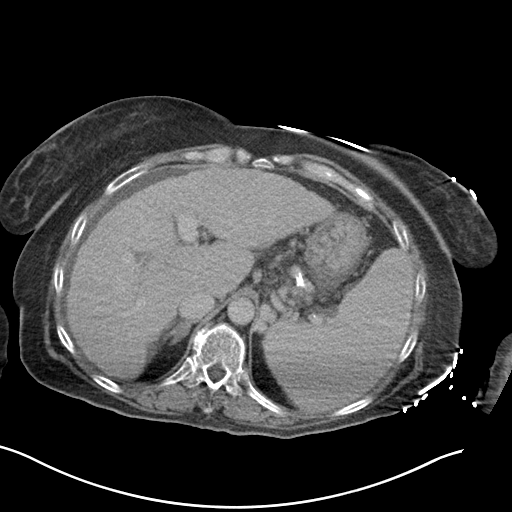
[im 78/98  lung]
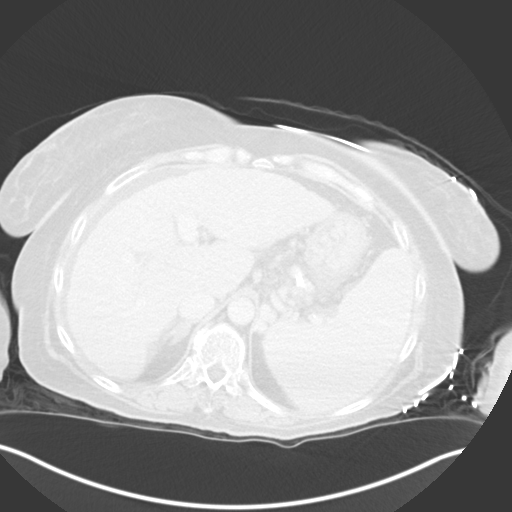

[11 of 46 positions shown; findings below may reference images not displayed]

FINDINGS: VASCULAR

Aorta: Normal caliber aorta without aneurysm, dissection, vasculitis
or significant stenosis. Scattered atherosclerotic vascular
calcifications.

Celiac: Patent without evidence of aneurysm, dissection, vasculitis
or significant stenosis.

SMA: Patent without evidence of aneurysm, dissection, vasculitis or
significant stenosis.

Renals: Both renal arteries are patent without evidence of aneurysm,
dissection, vasculitis, fibromuscular dysplasia or significant
stenosis.

IMA: Patent without evidence of aneurysm, dissection, vasculitis or
significant stenosis.

Inflow: Patent without evidence of aneurysm, dissection, vasculitis
or significant stenosis.

Proximal Outflow: Bilateral common femoral and visualized portions
of the superficial and profunda femoral arteries are patent without
evidence of aneurysm, dissection, vasculitis or significant
stenosis.

Veins: Widely patent main and intrahepatic portal veins. The main
portal vein is enlarged at 1.7 cm in diameter. The splenic vein
remains patent. Positive for gastro renal shunt with associated
gastric varices in the cardia and fundus. Primary VAN THIEN vein appears
to be the posterior gastric vein. There are also likely some small
short gastric contributors. Small recanalized paraumbilical vein.

Review of the MIP images confirms the above findings.

NON-VASCULAR

Lower chest: Left greater than right lower lobe dependent
atelectasis. Small volume aspiration not excluded. The heart is
normal in size. Calcification of the mitral valve annulus. No
pericardial effusion. Unremarkable distal thoracic esophagus.

Hepatobiliary: Nodular hepatic contour with morphologic changes
consistent with hepatic cirrhosis. No evidence of arterially
enhancing lesion to suggest hepatocellular carcinoma. The
gallbladder is surgically absent. No intra or extrahepatic biliary
ductal dilatation.

Pancreas: Unremarkable. No pancreatic ductal dilatation or
surrounding inflammatory changes.

Spleen: Splenomegaly.

Adrenals/Urinary Tract: 1.5 cm right adrenal nodule insignificantly
changed dating back to [DATE]. Favor benign adenoma. The left
adrenal gland is normal. No hydronephrosis, nephrolithiasis or
enhancing renal mass. Ureters and bladder are unremarkable.

Stomach/Bowel: Rectal tube in place. Diffuse submucosal thickening
of the rectum, sigmoid colon and to a lesser extent the descending
colon. No evidence of bowel obstruction. Gastric varices are
present. The duodenum and proximal jejunum are unremarkable.

Lymphatic: No suspicious lymphadenopathy.

Reproductive: Status post hysterectomy. No adnexal masses.

Other: Small volume ascites.

Musculoskeletal: No acute fracture or aggressive appearing lytic or
blastic osseous lesion. Multilevel degenerative disc disease.
IMPRESSION: VASCULAR

1. Positive for gastro renal shunt with associated gastric varices
in the cardia and fundus. The primary afferent portal venous inflow
appears to be via the posterior gastric vein. Suspect some accessory
contribution from short gastric veins as well.
2. Widely patent portal venous system.
3. No active bleeding at the time of the examination.
4. Multifocal atherosclerotic calcifications and plaque without
evidence of significant stenosis, occlusion or dissection.
5.  Aortic Atherosclerosis ([5S]-[5S]).

NON-VASCULAR

1. Hepatic cirrhosis with portal hypertension, splenomegaly and
small volume ascites.
2. No evidence of hepatocellular carcinoma.
3. Diffuse submucosal thickening of the sigmoid colon and rectum.
Differential considerations include infectious/inflammatory colitis
versus portal colopathy.
4. Additional ancillary findings as above.

## 2021-05-20 IMAGING — DX DG ABD PORTABLE 1V
1 series · 1 of 1 positions shown · non-contrast
Comparison: None.

CLINICAL DATA: Check gastric catheter placement

EXAM:
PORTABLE ABDOMEN - 1 VIEW

[abdomen kub]
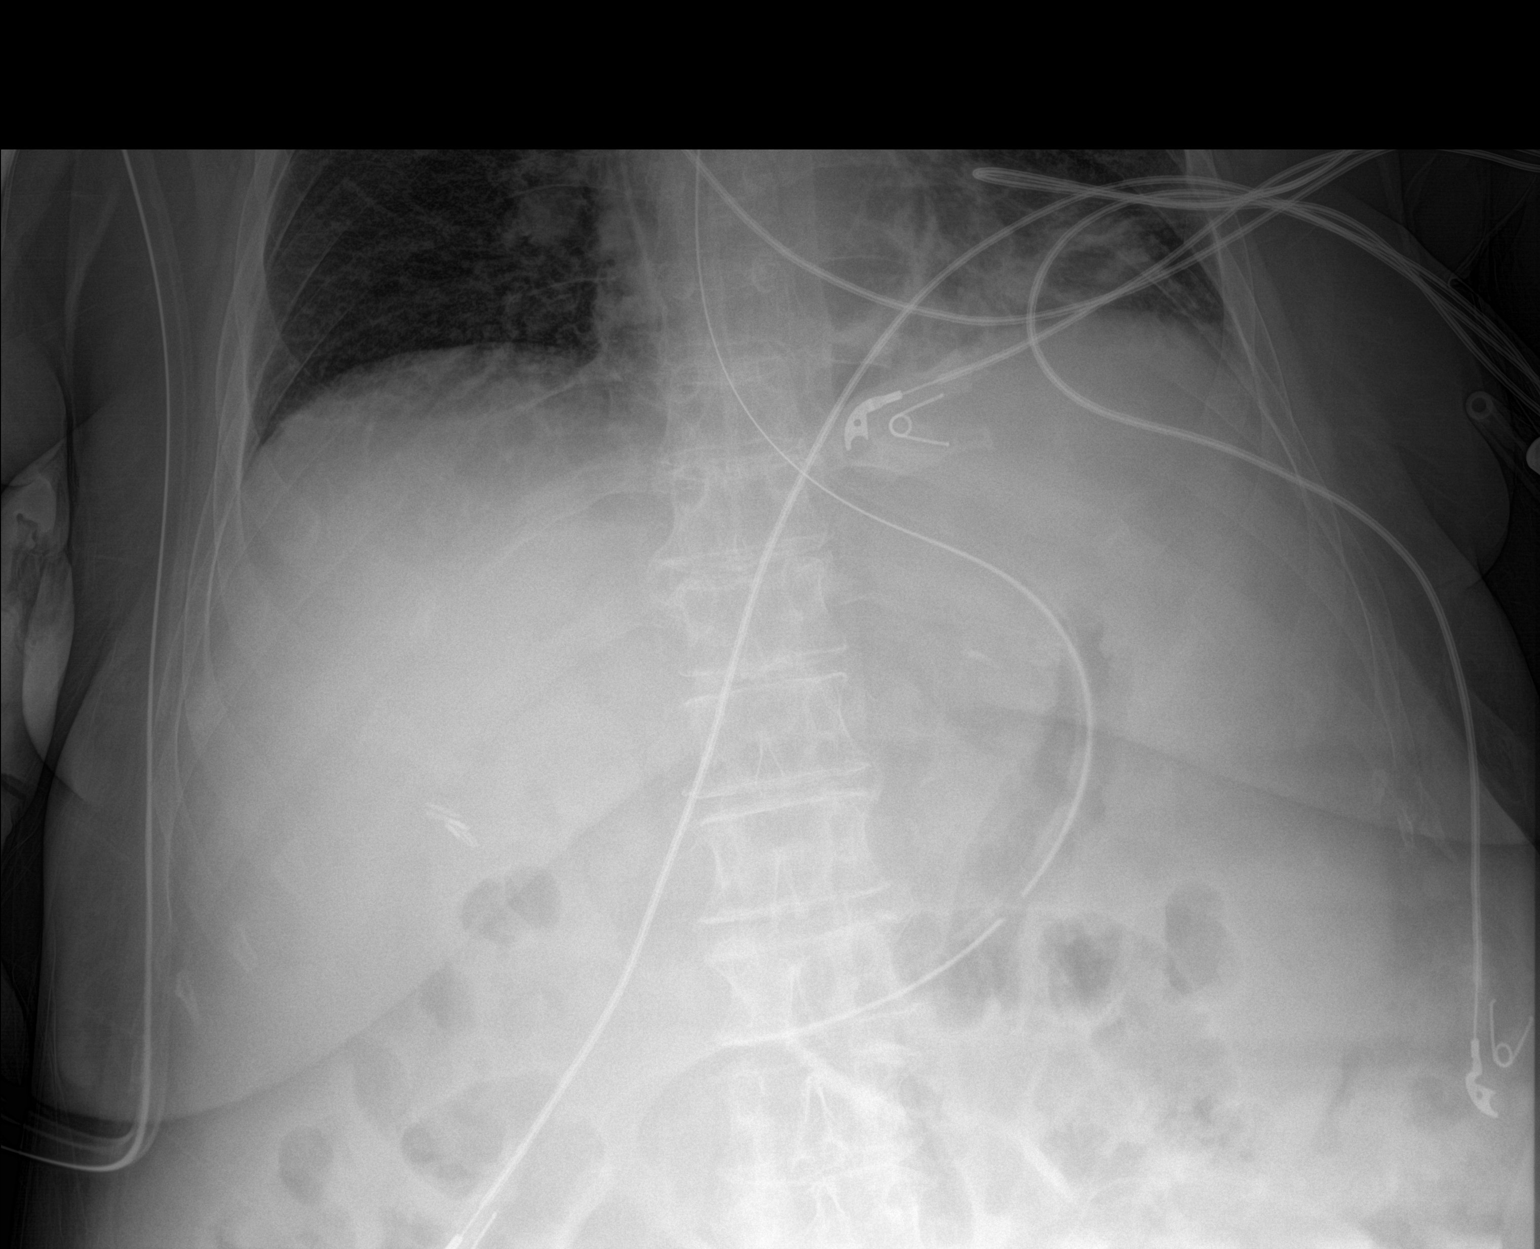

[1 of 1 positions shown; findings below may reference images not displayed]

FINDINGS: Gastric catheter is noted in the distal stomach. Nonobstructive
bowel gas pattern is seen.
IMPRESSION: Gastric catheter in the distal stomach.

## 2021-05-20 SURGERY — ESOPHAGOGASTRODUODENOSCOPY (EGD) WITH PROPOFOL
Anesthesia: General

## 2021-05-20 MED ORDER — POLYETHYLENE GLYCOL 3350 17 G PO PACK
17.0000 g | PACK | Freq: Every day | ORAL | Status: DC
  Administered 2021-05-21 – 2021-05-23 (×3): 17 g
  Filled 2021-05-20 (×3): qty 1

## 2021-05-20 MED ORDER — FENTANYL CITRATE (PF) 100 MCG/2ML IJ SOLN
25.0000 ug | INTRAMUSCULAR | Status: DC | PRN
Start: 2021-05-20 — End: 2021-05-24
  Administered 2021-05-20 (×4): 100 ug via INTRAVENOUS
  Administered 2021-05-24: 25 ug via INTRAVENOUS
  Filled 2021-05-20 (×4): qty 2

## 2021-05-20 MED ORDER — SODIUM CHLORIDE 0.9 % IV SOLN
250.0000 mg | Freq: Once | INTRAVENOUS | Status: AC
  Administered 2021-05-20: 250 mg via INTRAVENOUS
  Filled 2021-05-20: qty 5

## 2021-05-20 MED ORDER — SODIUM CHLORIDE 0.9 % IV SOLN
2.0000 g | Freq: Three times a day (TID) | INTRAVENOUS | Status: DC
  Administered 2021-05-20 – 2021-05-26 (×18): 2 g via INTRAVENOUS
  Filled 2021-05-20 (×18): qty 2

## 2021-05-20 MED ORDER — SODIUM CHLORIDE 0.9% IV SOLUTION: Freq: Once | INTRAVENOUS | Status: AC

## 2021-05-20 MED ORDER — SUCCINYLCHOLINE 20MG/ML (10ML) SYRINGE FOR MEDFUSION PUMP - OPTIME
INTRAMUSCULAR | Status: DC | PRN
  Administered 2021-05-20: 100 mg via INTRAVENOUS

## 2021-05-20 MED ORDER — PROPOFOL 1000 MG/100ML IV EMUL
0.0000 ug/kg/min | INTRAVENOUS | Status: DC
  Administered 2021-05-20: 5 ug/kg/min via INTRAVENOUS
  Administered 2021-05-21: 15 ug/kg/min via INTRAVENOUS
  Administered 2021-05-21 – 2021-05-22 (×2): 30 ug/kg/min via INTRAVENOUS
  Administered 2021-05-22: 15 ug/kg/min via INTRAVENOUS
  Administered 2021-05-23: 5 ug/kg/min via INTRAVENOUS
  Administered 2021-05-23: 15 ug/kg/min via INTRAVENOUS
  Administered 2021-05-24: 10 ug/kg/min via INTRAVENOUS
  Filled 2021-05-20 (×9): qty 100

## 2021-05-20 MED ORDER — ORAL CARE MOUTH RINSE
15.0000 mL | OROMUCOSAL | Status: DC
  Administered 2021-05-20 – 2021-05-24 (×35): 15 mL via OROMUCOSAL

## 2021-05-20 MED ORDER — FENTANYL 2500MCG IN NS 250ML (10MCG/ML) PREMIX INFUSION
0.0000 ug/h | INTRAVENOUS | Status: DC
  Administered 2021-05-20 – 2021-05-23 (×2): 25 ug/h via INTRAVENOUS
  Filled 2021-05-20 (×2): qty 250

## 2021-05-20 MED ORDER — CHLORHEXIDINE GLUCONATE CLOTH 2 % EX PADS
6.0000 | MEDICATED_PAD | Freq: Every day | CUTANEOUS | Status: DC
  Administered 2021-05-20: 6 via TOPICAL

## 2021-05-20 MED ORDER — SODIUM CHLORIDE 0.9% IV SOLUTION: Freq: Once | INTRAVENOUS | Status: DC

## 2021-05-20 MED ORDER — VANCOMYCIN HCL IN DEXTROSE 1-5 GM/200ML-% IV SOLN
1000.0000 mg | Freq: Two times a day (BID) | INTRAVENOUS | Status: DC
  Administered 2021-05-20 – 2021-05-22 (×4): 1000 mg via INTRAVENOUS
  Filled 2021-05-20 (×5): qty 200

## 2021-05-20 MED ORDER — DOCUSATE SODIUM 50 MG/5ML PO LIQD
100.0000 mg | Freq: Two times a day (BID) | ORAL | Status: DC
  Administered 2021-05-20 – 2021-05-23 (×7): 100 mg
  Filled 2021-05-20 (×7): qty 10

## 2021-05-20 MED ORDER — PROPOFOL 10 MG/ML IV BOLUS
INTRAVENOUS | Status: DC | PRN
  Administered 2021-05-20: 100 mg via INTRAVENOUS

## 2021-05-20 MED ORDER — ONDANSETRON HCL 4 MG/2ML IJ SOLN
INTRAMUSCULAR | Status: DC | PRN
  Administered 2021-05-20: 4 mg via INTRAVENOUS

## 2021-05-20 MED ORDER — SUCCINYLCHOLINE CHLORIDE 200 MG/10ML IV SOSY
PREFILLED_SYRINGE | INTRAVENOUS | Status: AC
  Filled 2021-05-20: qty 10

## 2021-05-20 MED ORDER — LACTULOSE ENEMA
300.0000 mL | Freq: Three times a day (TID) | ORAL | Status: AC
  Administered 2021-05-20: 300 mL via RECTAL
  Filled 2021-05-20 (×3): qty 300

## 2021-05-20 MED ORDER — FENTANYL CITRATE (PF) 100 MCG/2ML IJ SOLN: 25.0000 ug | INTRAMUSCULAR | Status: DC | PRN

## 2021-05-20 MED ORDER — MIDAZOLAM HCL 2 MG/2ML IJ SOLN
INTRAMUSCULAR | Status: AC
  Filled 2021-05-20: qty 2

## 2021-05-20 MED ORDER — FENTANYL CITRATE (PF) 100 MCG/2ML IJ SOLN
INTRAMUSCULAR | Status: DC | PRN
  Administered 2021-05-20: 75 ug via INTRAVENOUS

## 2021-05-20 MED ORDER — HALOPERIDOL LACTATE 5 MG/ML IJ SOLN
1.0000 mg | Freq: Four times a day (QID) | INTRAMUSCULAR | Status: DC | PRN
  Administered 2021-05-20: 1 mg via INTRAVENOUS
  Filled 2021-05-20: qty 0.2
  Filled 2021-05-20: qty 1

## 2021-05-20 MED ORDER — PROPOFOL 10 MG/ML IV BOLUS
INTRAVENOUS | Status: AC
  Filled 2021-05-20: qty 40

## 2021-05-20 MED ORDER — SODIUM CHLORIDE 0.9 % IV SOLN: INTRAVENOUS | Status: DC

## 2021-05-20 MED ORDER — LIDOCAINE HCL (PF) 2 % IJ SOLN
INTRAMUSCULAR | Status: AC
  Filled 2021-05-20: qty 5

## 2021-05-20 MED ORDER — LIDOCAINE HCL (CARDIAC) PF 50 MG/5ML IV SOSY
PREFILLED_SYRINGE | INTRAVENOUS | Status: DC | PRN
  Administered 2021-05-20: 80 mg via INTRAVENOUS

## 2021-05-20 MED ORDER — FENTANYL CITRATE (PF) 100 MCG/2ML IJ SOLN
INTRAMUSCULAR | Status: AC
  Filled 2021-05-20: qty 2

## 2021-05-20 MED ORDER — LACTATED RINGERS IV SOLN: INTRAVENOUS | Status: DC

## 2021-05-20 MED ORDER — CHLORHEXIDINE GLUCONATE 0.12% ORAL RINSE (MEDLINE KIT)
15.0000 mL | Freq: Two times a day (BID) | OROMUCOSAL | Status: DC
  Administered 2021-05-20 – 2021-05-24 (×8): 15 mL via OROMUCOSAL

## 2021-05-20 MED ORDER — IOHEXOL 350 MG/ML SOLN
100.0000 mL | Freq: Once | INTRAVENOUS | Status: AC | PRN
  Administered 2021-05-20: 100 mL via INTRAVENOUS

## 2021-05-20 NOTE — Progress Notes (Addendum)
Pharmacy Antibiotic Note  Ariel Arnold is a 65 y.o. female admitted on 05/19/2021 with hematemesis.  Pt has h/o LLE osteomyelitis on home antibiotics x 6 weeks s/p recent admission 5/2-5/6.   Pharmacy has been consulted to continue Vancomycin and Cefepime dosing.  Unclear of current Vancomycin regimen---pt has had multiple Vancomycin troughs ranging from 9-19 over the last three weeks.  Prescribed Vancomycin 1 g IV q12h on discharge.  Vancomycin level 9 tonight, though unclear when last dose was given.     Plan: Vancomycin 1 g IV q12h Cefepime 2 g IV q8h D/C Rocephin  Height: 5\' 8"  (172.7 cm) Weight: 79.4 kg (175 lb) IBW/kg (Calculated) : 63.9  Temp (24hrs), Avg:98 F (36.7 C), Min:98 F (36.7 C), Max:98 F (36.7 C)  Recent Labs  Lab 05/19/21 1525 05/19/21 2302  WBC 2.7*  --   CREATININE 0.72  --   VANCOTROUGH  --  9*    Estimated Creatinine Clearance: 76.6 mL/min (by C-G formula based on SCr of 0.72 mg/dL).    No Known Allergies   07/19/21 05/20/2021 12:29 AM
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
## 2021-05-20 NOTE — Anesthesia Procedure Notes (Signed)
Procedure Name: Intubation Date/Time: 05/20/2021 12:38 PM Performed by: Ollen Bowl, CRNA Pre-anesthesia Checklist: Patient identified, Patient being monitored, Timeout performed, Emergency Drugs available and Suction available Patient Re-evaluated:Patient Re-evaluated prior to induction Oxygen Delivery Method: Circle system utilized Preoxygenation: Pre-oxygenation with 100% oxygen Induction Type: IV induction Ventilation: Mask ventilation without difficulty Laryngoscope Size: Mac and 3 Grade View: Grade I Tube type: Oral Tube size: 7.0 mm Number of attempts: 1 Airway Equipment and Method: Stylet Placement Confirmation: ETT inserted through vocal cords under direct vision,  positive ETCO2 and breath sounds checked- equal and bilateral Secured at: 21 cm Tube secured with: Tape Dental Injury: Teeth and Oropharynx as per pre-operative assessment

## 2021-05-20 NOTE — Transfer of Care (Signed)
Immediate Anesthesia Transfer of Care Note  Patient: Ariel Arnold  Procedure(s) Performed: ESOPHAGOGASTRODUODENOSCOPY (EGD) WITH PROPOFOL (N/A )  Patient Location: ICU  Anesthesia Type:General  Level of Consciousness: sedated  Airway & Oxygen Therapy: Patient remains intubated per anesthesia plan and Patient placed on Ventilator (see vital sign flow sheet for setting)  Post-op Assessment: Report given to RN and Post -op Vital signs reviewed and stable  Post vital signs: Reviewed and stable  Last Vitals:  Vitals Value Taken Time  BP    Temp    Pulse 95 05/20/21 1325  Resp 20 05/20/21 1325  SpO2 100 % 05/20/21 1325  Vitals shown include unvalidated device data.  Last Pain:  Vitals:   05/20/21 1234  TempSrc:   PainSc: 0-No pain         Complications: No complications documented.

## 2021-05-20 NOTE — Op Note (Signed)
[facility] Patient Name: Ariel Arnold Procedure Date: 05/20/2021 10:44 AM MRN: [id number] Date of Birth: 1956-12-15 Attending MD: Gennette Pac , MD CSN: [id number] Age: 65 Admit Type: Inpatient Procedure:                Upper GI endoscopy Indications:              Hematemesis Providers:                Gennette Pac, MD, Criselda Peaches. Patsy Lager, RN,                            Pandora Leiter, Technician Referring MD:              Medicines:                Propofol per Anesthesia, General Anesthesia Complications:            No immediate complications. Estimated Blood Loss:     Estimated blood loss: none. Procedure:                After obtaining informed consent, the endoscope was                            passed under direct vision. Throughout the                            procedure, the patient's blood pressure, pulse, and                            oxygen saturations were monitored continuously. The                            [id number]-H190 ([id number]) scope was introduced through the                            mouth, and advanced to the second part of duodenum. Scope In: 12:43:33 PM Scope Out: 12:49:05 PM Total Procedure Duration: 0 hours 5 minutes 32 seconds  Findings:      Esophagus looked good except for a subtle approximately 5 cm in length       grade 1 varix just above GE junction. No bleeding stigmata.      Gastric cavity empty. Diffuse gastric mucosal changes consistent with       congested portal gastropathy. Retroflexion view the proximal stomach and       esophagogastric junction demonstrated an area of fundal varices which       were adjacent and contiguous with the GE junction. Overlying erosions       were noted. No "whale marks" or cherry red spot" noted. No ulcer or       infiltrating process seen. Patent pylorus. Normal first and second       portion of the duodenum Impression:               - Minimal, innocent appearing, grade 1 varix                            - Fundal varix with equivocal bleeding stigmata.  Moderately severe portal gastropathy                           - Normal-appearing first and second portion of the                            duodenum                           - Patient has well-documented recurrent upper GI                            bleeding in the setting of known gastric varices.                            Although she could have bled from oozing gastric                            mucosa, I am suspicious of gastric variceal                            etiology. Moderate Sedation:      Moderate (conscious) sedation was personally administered by an       anesthesia professional. The following parameters were monitored: oxygen       saturation, heart rate, blood pressure, respiratory rate, EKG, adequacy       of pulmonary ventilation, and response to care. Recommendation:           - Return patient to ICU for ongoing care. Keep                            intubated for now. Continue antibiotics,                            Sandostatin and PPI.                           -I feel this lady needs to have her gastric varices                            definitively treated via IR (BRTO). We will reach                            out to IR consultation. I have discussed the case                            (before and after procedure) with the patient's                            daughter,, Ariel Arnold at [phone]                           -Endoscopy findings and recommendations for IR  consultation reviewed in detail. Her questions were                            answered. She is agreeable to the plan. Procedure Code(s):        --- Professional ---                           [id number], Esophagogastroduodenoscopy, flexible,                            transoral; diagnostic, including collection of                            specimen(s) by brushing or washing, when performed                             (separate procedure) Diagnosis Code(s):        --- Professional ---                           K92.0, Hematemesis CPT copyright 2019 [employer]. All rights reserved. The codes documented in this report are preliminary and upon coder review may  be revised to meet current compliance requirements. Gerrit Friends. Alexias Margerum, MD Gennette Pac, MD 05/20/2021 1:56:38 PM This report has been signed electronically. Number of Addenda: 0

## 2021-05-20 NOTE — Consult Note (Signed)
WOC Nurse Consult Note: Patient receiving care in [facility]. 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 She has declined further surgery to the foot. [facility] treating with Puracol AG (a collagen dressing with silver, which is not on the [facility] formulary) 4 x 4 gauzes, and kerlix.  She had also opted for hyperbaric oxygen therapy. Pressure Injury POA: Yes/No/NA Measurement: Wound bed: Drainage (amount, consistency, odor)  Periwound: Dressing procedure/placement/frequency: Cleanse left foot wound with saline, pat dry. Place Aquacel [place] [phone]) into and over the wound, then dry 4 x 4 gauze, secure with kerlix. Change daily. The patient will need to resume [facility] wound care upon discharge. Thank you for the consult. WOC nurse will not follow at this time.  Please re-consult the WOC team if needed.  Helmut Muster, RN, MSN, CWOCN, CNS-BC, pager [phone]

## 2021-05-20 NOTE — Procedures (Signed)
Procedure Note  05/20/21    Preoperative Diagnosis:  Gi bleed from varices, NASH cirrhosis, poor IV access    Postoperative Diagnosis: Same   Procedure(s) Performed: Central Line placement, left internal jugular vein    Surgeon: Leatrice Jewels. Henreitta Leber, MD   Assistants: None   Anesthesia: 1% lidocaine    Complications: None    Indications: Ariel Arnold is a 65 y.o. with a GI bleed from varices that needs IR and Nash cirrhosis. She only has 1 IV via a single lumen PICC and needs additional access. No peripheral access was available. I discussed the risk and benefits of placement of the central line with her daughter Ariel Arnold, including but not limited to bleeding, infection, and risk of pneumothorax. She has given verbal consent for the procedure.    Procedure: The patient placed supine. The left chest and neck was prepped and draped in the usual sterile fashion.  Wearing full gown and gloves, I performed the procedure.  One percent lidocaine was used for local anesthesia. An ultrasound was utilized to assess the jugular vein.  The needle with syringe was advanced into the vein with dark venous return, and a wire was placed using the Seldinger technique without difficulty.  Ectopia was not noted.  The skin was knicked and a dilator was placed, and the three lumen catheter was placed over the wire with continued control of the wire.  There was good draw back of blood from all three lumens and each flushed easily with saline.  The catheter was secured in 4 points with 2-0 silk and a biopatch and dressing was placed.     The patient tolerated the procedure well, and the CXR was ordered to confirm position of the central line.   Algis Greenhouse, MD [facility] [address] [place], [place] [address] [phone] (office) ;

## 2021-05-20 NOTE — Anesthesia Postprocedure Evaluation (Signed)
Anesthesia Post Note  Patient: Ariel Arnold  Procedure(s) Performed: ESOPHAGOGASTRODUODENOSCOPY (EGD) WITH PROPOFOL (N/A )  Patient location during evaluation: ICU Anesthesia Type: General Level of consciousness: sedated Pain management: pain level controlled Vital Signs Assessment: post-procedure vital signs reviewed and stable Respiratory status: patient on ventilator - see flowsheet for VS Cardiovascular status: blood pressure returned to baseline and stable Postop Assessment: no apparent nausea or vomiting Anesthetic complications: no Comments: Patient remains intubated for potential transfer to [facility].   No complications documented.   Last Vitals:  Vitals:   05/20/21 1043 05/20/21 1143  BP: 135/66 (!) 164/88  Pulse: 91 98  Resp: 18 17  Temp: 36.6 C   SpO2: 100% 100%    Last Pain:  Vitals:   05/20/21 1234  TempSrc:   PainSc: 0-No pain                 Emari Demmer

## 2021-05-20 NOTE — Progress Notes (Addendum)
[facility] called with an ETA of about 15 mins. Report given to [facility] staff. Passed on to night shift RN.

## 2021-05-20 NOTE — Progress Notes (Signed)
Inpatient Diabetes Program Recommendations  AACE/ADA: New Consensus Statement on Inpatient Glycemic Control (2015)  Target Ranges:  Prepandial:   less than 140 mg/dL      Peak postprandial:   less than 180 mg/dL (1-2 hours)      Critically ill patients:  140 - 180 mg/dL   Lab Results  Component Value Date   GLUCAP 151 (H) 05/20/2021    Review of Glycemic Control  Diabetes history: DM2 Outpatient Diabetes medications: Toujeo Max 60 units QD, metformin 1000 mg BID, Novolog 4 units TID with meals Current orders for Inpatient glycemic control: Novolog 0-15 units Q4H  No HgbA1C available.  Inpatient Diabetes Program Recommendations:     Will likely need part of home basal insulin. Lantus 15 units QHS  Follow glucose trends.  Thank you. Ailene Ards, RD, LDN, CDE Inpatient Diabetes Coordinator [phone]

## 2021-05-20 NOTE — Progress Notes (Signed)
Pt has lost ultrasound IV, only has single lumen PICC now. Blood going in PICC, protonix, sandostatin, and NS @75  paused until it is done. No one trained to use ultrasound currently on shift, Previous attempts to start IV without use of ultrasound unsuccessful. MD notified.

## 2021-05-20 NOTE — Progress Notes (Signed)
Pt very lethargic throughout shift, able to respond when spoken to, was able to answer questions about her antibiotics when asked. Was able to give verbal consent for blood, but would not move hands. Pt also would not move hands to hold emesis bag when she was dry heaving. Pt's verbal consent to receive blood witnessed and signed by this RN and Consulting civil engineer. Attempt made to call patient's family to receive consent, however pt has no emergency contacts listed.

## 2021-05-20 NOTE — Progress Notes (Signed)
2 rings removed from patient. Placed in bag with patient identification sticker on it. At bedside and will transfer with patient.

## 2021-05-20 NOTE — Progress Notes (Signed)
Unable to obtain another PIV line by anesthesia in pre-op, blood in PICC line paused for fluids, erythromycin, and sedation drugs during procedure per Dr. Johnnette Litter

## 2021-05-20 NOTE — Progress Notes (Signed)
Spoke to IR, request for consult for BRTO for definitive treatment for gastric varix placed. IR to review case and let us know if ok to initiate transfer process. Dr. Jena Gauss discussed with patient's daughter, [name] [phone]). Dr. Flossie Dibble [facility]) aware.  Leanna Battles. Dixon Boos [facility] [phone] 6/2/20222:29 PM

## 2021-05-20 NOTE — Progress Notes (Addendum)
PROGRESS NOTE    Patient: Ariel Arnold                            PCP: Joaquin Courts, DO                    DOB: 04/30/1956            DOA: 05/19/2021 PNT:[id number]             DOS: 05/20/2021, 12:32 PM   LOS: 0 days   Date of Service: The patient was seen and examined on 05/20/2021  Subjective:   The patient was seen and examined this morning. Confused trying to get out of bed, follows some command, able to respond to verbal command. Hemodynamically stable Per nursing staff mentation is improving.   Back from procedure EGD remains sedated, intubated    Brief Narrative:   Ariel Arnold is a 65 y.o. female with medical history significant for liver cirrhosis 2/2 NASH, hepatic encephalopathy, hypertension, thrombocytopenia, diabetes mellitus, osteomyelitis. Patient presented to the ED with complaints of vomiting blood today and dizziness.  She tells me she has had 2 episodes today.  She reports black stools.  She is unable to tell me if she has had blood in her stools.  Patient appears a little confused, but she answers most questions.  She tells me she takes tramadol for pain, she was unable to confirm or refute NSAID use.  Patient mention the history of esophageal varices.  Patient has a PICC line for which she is receiving antibiotics for left foot.  Recently hospitalized 5/2-5/6 at [facility] osteomyelitis of the left foot, for which patient declined surgery, opting to try for IV antibiotics and hyperbaric oxygen.  Patient was to complete 6 weeks of IV vancomycin and cefepime.  And was referred to [name] for hyperbarics.  Also received 1 units of blood for hemoglobin of less than 7, without evidence of acute blood loss.  Patient receives IV iron infusion at the outpatient hematology center.    ED Course: Blood pressure systolic 110s to 086P.  Heart rate 80s to 90s.  Hemoglobin 8.  INR 1.7.  Platelets 62.  WBC 2.7. EDP Dr. Levon Hedger, who recommended starting octreotide,  Protonix drip, IV ceftriaxone.   Assessment & Plan:   Principal Problem:   Acute GI bleeding Active Problems:   Liver cirrhosis secondary to NASH [facility])   Hepatic encephalopathy (HCC)   Thrombocytopenia (HCC)   Acute osteomyelitis of left foot (HCC)    Addendum 05/20/2021 Post EGD patient presented to ICU intubated sedation was initiated -We will continue monitoring the patient closely, With goal of weaning off sedation and extubating in next 24 hours  Patient may need Balloon-occluded retrograde transvenous obliteration (BRTO) -which is done by IR at [facility]. Apparently the case has been reviewed, per protocol CT has been ordered. Once the IR has evaluated and read the CT will decide if the patient is a candidate at that point she will need to be transferred discount.... It was likely will be completed in a.m. GI/IR will notify us regarding transfer.    acute upper GI bleed - 2 episodes of hematemesis, ... Improved no further episodes  -Hemodynamically stable (blood pressure elevated -Hemoglobin dropped (baseline 7-8) 8>>> 6.7, 7.3 -Pursuing with 2 units of PRBC transfusion -GI consulted-planning for EGD today 05/20/2021 Last EGD 03/17/2021-reported no active bleeding, stigmata of recent bleeding, 1 small esophageal varices in the  distal esophagus, diffuse erythematous mucosa, in the stomach suggestive of gastritis, large nonbleeding gastric varices in the cardia/fundus.  -Patient had a colonoscopy earlier that month that showed right-sided AVM, it was suspected that patient had multiple AVMs.  -Monitor hemoglobin every 6 hourly x4   -GI Dr. Levon Hedger on board -N.p.o. -Continue IV Protonix -Continue IV octreotide - IV ceftriaxone - N/s 75cc/hr   Liver cirrhosis secondary to NASH with history of hepatic encephalopathy -Still confused, per nursing staff mentation improving -Now mildly agitated, -Rectal tube in place, status post lactulose enema -Ammonia level improving -Per  record lactulose twice a day, with 3-4 bowel movement a day, -Remain n.p.o. -INR 1.7, 1.6  -IV vitamin K 5 mg x 1  Thrombocytopenia  -Platelets 61, 70, 93 -Monitoring platelets closely (records indicate platelet baseline 78-1 02)   Left foot osteomyelitis -to complete 6 weeks of IV vancomycin and cefepime + apparent treatment started during recent hospitalization at [facility] 5/2 - 5/6. -Continue antibiotics, pharmacy to dose -Check Vanco trough  Left foot ray amputation -continue dressing, wound care    -------------------------------------------------------------------------------------------------------------------------------------------- Nutritional status:  The patient's BMI is: Body mass index is 26.61 kg/m. I agree with the assessment and plan as outlined     -------------------------------------------------------------------------------------------------------------------------------------------- Cultures; None    Antimicrobials: IV vancomycin/cefepime   Consultants: Gastroenterologist  -------------------------------------------------------------------------------------------------------------------------------------------  DVT prophylaxis:  SCD/Compression stockings Code Status:   Code Status: Full Code  Family Communication: No family member present at bedside- attempt will be made to update daily The above findings and plan of care has been discussed with patient (and family)  in detail,  they expressed understanding and agreement of above. -Advance care planning has been discussed.   Admission status:   Status is: Inpatient  Patient meets the inpatient criteria due to acute encephalopathy, GI bleed, needing evaluation by consulting GI, needing procedure EGD, needing blood transfusion, needing IV medication including octreotide, IV Protonix... Need close observation in ICU setting.    Dispo: The patient is from: Home              Anticipated d/c is  to: TBD              Patient currently is not medically stable to d/c.  Remains encephalopathic, anemic, hemodynamically  stable, needing ICU observation, consulting GI, intervention, needing IV medication   Difficult to place patient No      Level of care: Stepdown   Procedures:   No admission procedures for hospital encounter.     Antimicrobials:  Anti-infectives (From admission, onward)   Start     Dose/Rate Route Frequency Ordered Stop   05/20/21 1400  [MAR Hold]  ceFEPIme (MAXIPIME) 2 g in sodium chloride 0.9 % 100 mL IVPB        (MAR Hold since Thu 05/20/2021 at 1040.Hold Reason: Transfer to a Procedural area.)   2 g 200 mL/hr over 30 Minutes Intravenous Every 8 hours 05/20/21 0039     05/20/21 1100  [MAR Hold]  erythromycin 250 mg in sodium chloride 0.9 % 100 mL IVPB        (MAR Hold since Thu 05/20/2021 at 1040.Hold Reason: Transfer to a Procedural area.)  Note to Pharmacy: Give 90 minutes prior to upper endoscopy today. Give over 60 minutes.   250 mg 100 mL/hr over 60 Minutes Intravenous Once 05/20/21 1010     05/20/21 0100  [MAR Hold]  vancomycin (VANCOCIN) IVPB 1000 mg/200 mL premix        (MAR Hold since  Thu 05/20/2021 at 1040.Hold Reason: Transfer to a Procedural area.)   1,000 mg 200 mL/hr over 60 Minutes Intravenous 2 times daily 05/20/21 0037     05/19/21 1930  cefTRIAXone (ROCEPHIN) 2 g in sodium chloride 0.9 % 100 mL IVPB  Status:  Discontinued        2 g 200 mL/hr over 30 Minutes Intravenous Every 24 hours 05/19/21 1923 05/20/21 0039       Medication:  . [MAR Hold] sodium chloride   Intravenous Once  . [MAR Hold] sodium chloride   Intravenous Once  . [MAR Hold] Chlorhexidine Gluconate Cloth  6 each Topical Daily  . [MAR Hold] insulin aspart  0-15 Units Subcutaneous Q4H  . [MAR Hold] lactulose  300 mL Rectal TID  . [MAR Hold] pantoprazole  40 mg Intravenous Q12H    [MAR Hold] acetaminophen **OR** [MAR Hold] acetaminophen, haloperidol lactate, [MAR Hold]  ondansetron **OR** [MAR Hold] ondansetron (ZOFRAN) IV   Objective:   Vitals:   05/20/21 1015 05/20/21 1030 05/20/21 1043 05/20/21 1143  BP: (!) 120/96  135/66 (!) 164/88  Pulse: 94 98 91 98  Resp: Temp:   97.8 F (36.6 C)   TempSrc:   Oral   SpO2: 96% 100% 100% 100%  Weight:      Height:        Intake/Output Summary (Last 24 hours) at 05/20/2021 1232 Last data filed at 05/20/2021 1215 Gross per 24 hour  Intake 3555.4 ml  Output 1000 ml  Net 2555.4 ml   Filed Weights   05/19/21 1232  Weight: 79.4 kg     Examination:   Physical Exam  Constitution: Awake, alert, mild confusion Psychiatric: Normal and stable mood and affect, cognition intact,   HEENT: Normocephalic, PERRL, otherwise with in Normal limits  Chest:Chest symmetric Cardio vascular:  S1/S2, RRR, No murmure, No Rubs or Gallops  pulmonary: Clear to auscultation bilaterally, respirations unlabored, negative wheezes / crackles Abdomen: Soft, non-tender, non-distended, bowel sounds,no masses, no organomegaly Muscular skeletal: Limited exam - in bed, able to move all 4 extremities, Normal strength,  Neuro: CNII-XII intact. , normal motor and sensation, reflexes intact  Extremities: No pitting edema lower extremities, +2 pulses  Skin: Dry, warm to touch, negative for any Rashes, No open wounds Wounds: per nursing documentation Multiple peripheral lines including central line, rectal tube in place    ------------------------------------------------------------------------------------------------------------------------------------------    LABs:  CBC Latest Ref Rng & Units 05/20/2021 05/20/2021 05/20/2021  WBC 4.0 - 10.5 K/uL 5.5 - 3.8(L)  Hemoglobin 12.0 - 15.0 g/dL 7.3(L) 7.3(L) 6.8(LL)  Hematocrit 36.0 - 46.0 % 24.1(L) 24.4(L) 22.6(L)  Platelets 150 - 400 K/uL 93(L) - 70(L)   CMP Latest Ref Rng & Units 05/20/2021 05/19/2021  Glucose 70 - 99 mg/dL 161(W) 960(A)  BUN 8 - 23 mg/dL 54(U) 98(J)  Creatinine 0.44  - 1.00 mg/dL 1.91 4.78  Sodium 295 - 145 mmol/L 142 138  Potassium 3.5 - 5.1 mmol/L 4.2 5.1  Chloride 98 - 111 mmol/L 111 106  CO2 22 - 32 mmol/L 23 25  Calcium 8.9 - 10.3 mg/dL 6.2(Z) 3.0(Q)  Total Protein 6.5 - 8.1 g/dL - 5.7(L)  Total Bilirubin 0.3 - 1.2 mg/dL - 1.6(H)  Alkaline Phos 38 - 126 U/L - 107  AST 15 - 41 U/L - 71(H)  ALT 0 - 44 U/L - 41       Micro Results Recent Results (from the past 240 hour(s))  Resp Panel by RT-PCR (Flu  A&B, Covid) Nasopharyngeal Swab     Status: None   Collection Time: 05/19/21  4:30 PM   Specimen: Nasopharyngeal Swab; Nasopharyngeal(NP) swabs in vial transport medium  Result Value Ref Range Status   SARS Coronavirus 2 by RT PCR NEGATIVE NEGATIVE Final    Comment: (NOTE) SARS-CoV-2 target nucleic acids are NOT DETECTED.  The SARS-CoV-2 RNA is generally detectable in upper respiratory specimens during the acute phase of infection. The lowest concentration of SARS-CoV-2 viral copies this assay can detect is 138 copies/mL. A negative result does not preclude SARS-Cov-2 infection and should not be used as the sole basis for treatment or other patient management decisions. A negative result may occur with  improper specimen collection/handling, submission of specimen other than nasopharyngeal swab, presence of viral mutation(s) within the areas targeted by this assay, and inadequate number of viral copies(<138 copies/mL). A negative result must be combined with clinical observations, patient history, and epidemiological information. The expected result is Negative.  Fact Sheet for Patients:  BloggerCourse.comhttps://www.fda.gov/media/152166/download  Fact Sheet for Healthcare Providers:  SeriousBroker.ithttps://www.fda.gov/media/152162/download  This test is no t yet approved or cleared by the [place] FDA and  has been authorized for detection and/or diagnosis of SARS-CoV-2 by FDA under an Emergency Use Authorization (EUA). This EUA will remain  in effect (meaning  this test can be used) for the duration of the COVID-19 declaration under Section 564(b)(1) of the Act, 21 U.S.C.section 360bbb-3(b)(1), unless the authorization is terminated  or revoked sooner.       Influenza A by PCR NEGATIVE NEGATIVE Final   Influenza B by PCR NEGATIVE NEGATIVE Final    Comment: (NOTE) The Xpert Xpress SARS-CoV-2/FLU/RSV plus assay is intended as an aid in the diagnosis of influenza from Nasopharyngeal swab specimens and should not be used as a sole basis for treatment. Nasal washings and aspirates are unacceptable for Xpert Xpress SARS-CoV-2/FLU/RSV testing.  Fact Sheet for Patients: BloggerCourse.comhttps://www.fda.gov/media/152166/download  Fact Sheet for Healthcare Providers: SeriousBroker.ithttps://www.fda.gov/media/152162/download  This test is not yet approved or cleared by the [place] FDA and has been authorized for detection and/or diagnosis of SARS-CoV-2 by FDA under an Emergency Use Authorization (EUA). This EUA will remain in effect (meaning this test can be used) for the duration of the COVID-19 declaration under Section 564(b)(1) of the Act, 21 U.S.C. section 360bbb-3(b)(1), unless the authorization is terminated or revoked.  Performed at [facility], [address] [place], [place] [address]     Radiology Reports DG Ribs Unilateral Right  Result Date: 05/19/2021 CLINICAL DATA:  Right-sided rib pain following fall, initial encounter EXAM: RIGHT RIBS - 2 VIEW COMPARISON:  None. FINDINGS: Mild irregularity of the anterior aspect of the right fifth through seventh ribs is noted consistent with acute fracture. No pneumothorax is seen. No other focal abnormality is noted. IMPRESSION: Right-sided rib fractures as described without complicating factors. Electronically Signed   By: Alcide CleverMark  Lukens M.D.   On: 05/19/2021 17:59   DG Ribs Unilateral W/Chest Left  Result Date: 05/19/2021 CLINICAL DATA:  Recent fall with bilateral rib pain, initial encounter EXAM: LEFT RIBS AND CHEST  - 3+ VIEW COMPARISON:  None. FINDINGS: Minimally displaced left fifth rib fracture is noted anteriorly. No other fractures are seen. No pneumothorax is noted. Right-sided PICC line is noted at the junction of the innominate veins. IMPRESSION: Left fifth rib fracture without complicating factors. Electronically Signed   By: Alcide CleverMark  Lukens M.D.   On: 05/19/2021 17:58   CT Head Wo Contrast  Result Date: 05/19/2021  CLINICAL DATA:  Head trauma EXAM: CT HEAD WITHOUT CONTRAST TECHNIQUE: Contiguous axial images were obtained from the base of the skull through the vertex without intravenous contrast. COMPARISON:  04/01/2021 FINDINGS: Brain: Mild age related volume loss. No acute intracranial abnormality. Specifically, no hemorrhage, hydrocephalus, mass lesion, acute infarction, or significant intracranial injury. Vascular: No hyperdense vessel or unexpected calcification. Skull: No acute calvarial abnormality. Sinuses/Orbits: No acute findings Other: None IMPRESSION: No acute intracranial abnormality. Electronically Signed   By: Charlett Nose M.D.   On: 05/19/2021 17:23   DG Knee Complete 4 Views Right  Result Date: 05/19/2021 CLINICAL DATA:  Recent fall with right knee pain, initial encounter EXAM: RIGHT KNEE - COMPLETE 4+ VIEW COMPARISON:  None. FINDINGS: Meniscal calcification is identified. No fracture or dislocation is seen. No joint effusion is noted. Mild soft tissue swelling is noted anteriorly consistent with the recent injury. IMPRESSION: Soft tissue swelling without acute bony abnormality. Electronically Signed   By: Alcide Clever M.D.   On: 05/19/2021 17:56    SIGNED: Kendell Bane, MD, FHM. [facility],  Pager (please use [email or web address] to page/text) Please use Epic Secure Chat for non-urgent communication (7AM-7PM)  If 7PM-7AM, please contact night-coverage [email or web address], 05/20/2021, 12:32 PM

## 2021-05-20 NOTE — Anesthesia Preprocedure Evaluation (Signed)
Anesthesia Evaluation  Patient identified by MRN, date of birth, ID band Patient confused    Reviewed: Allergy & Precautions, H&P , NPO status , Patient's Chart, lab work & pertinent test results, reviewed documented beta blocker date and time , Unable to perform ROS - Chart review onlyPreop documentation limited or incomplete due to emergent nature of procedure.  Airway Mallampati: II  TM Distance: >3 FB Neck ROM: full    Dental no notable dental hx.    Pulmonary neg pulmonary ROS, former smoker,    Pulmonary exam normal breath sounds clear to auscultation       Cardiovascular Exercise Tolerance: Good hypertension, negative cardio ROS   Rhythm:regular Rate:Normal     Neuro/Psych negative neurological ROS  negative psych ROS   GI/Hepatic negative GI ROS, Neg liver ROS, (+) Hepatitis -, Autoimmune  Endo/Other  negative endocrine ROSdiabetes  Renal/GU negative Renal ROS  negative genitourinary   Musculoskeletal negative musculoskeletal ROS (+)   Abdominal   Peds negative pediatric ROS (+)  Hematology negative hematology ROS (+) Blood dyscrasia, anemia ,   Anesthesia Other Findings History of gastric varices  Reproductive/Obstetrics negative OB ROS                             Anesthesia Physical Anesthesia Plan  ASA: V and emergent  Anesthesia Plan: General, General ETT, Cricoid Pressure and Rapid Sequence   Post-op Pain Management:    Induction:   PONV Risk Score and Plan: Ondansetron  Airway Management Planned:   Additional Equipment:   Intra-op Plan:   Post-operative Plan:   Informed Consent: I have reviewed the patients History and Physical, chart, labs and discussed the procedure including the risks, benefits and alternatives for the proposed anesthesia with the patient or authorized representative who has indicated his/her understanding and acceptance.     Dental  Advisory Given  Plan Discussed with: CRNA  Anesthesia Plan Comments:         Anesthesia Quick Evaluation

## 2021-05-20 NOTE — Progress Notes (Addendum)
Pt being transported down to short stay via staff. 1st unit of blood transfusing. Octreotide on pause until pharmacy brings another bag down to short stay. Protonix on hold as patient doesn't have enough IV access for them all to infuse, and when talking with the GI PA, she stated the Octreotide was higher priority than the protonix until all IV access' were available again.  Pt's daughter the only contact information available, placed in chart. Daughter's name is [name] and can be reached at [phone]. Will continue to monitor.

## 2021-05-20 NOTE — H&P (Signed)
NAME:  Ariel Arnold, MRN:  [id number], DOB:  1956-12-10, LOS: 0 ADMISSION DATE:  05/19/2021, CONSULTATION DATE:  6/2 REFERRING MD:  , CHIEF COMPLAINT:  hematemesis   History of Present Illness:  Per chart review, unable to obtain from patient due to clinical status   Ariel Arnold is a 65 yo F who presented to the [facility] ED on 6/1 with complaints of hematemesis  She has a past medical history of recurrent GIB, hepatic encephalopathy, cirrhosis secondary to NASH, hepatic encephalopathy, osteomyelitis with partial amputation of the left foot.   The morning of 6/1 Ariel Arnold woke up nauseous and had one reported episode of emesis with red blood. EMS was called and she was transported to the [facility] ED. It is unclear from documentation if she was having blood in stools. Her ED workup was notable for HGB of 8, PLT 62, INR of 1.7, WBC 2.7. She was started on IV octreotide, Protonix drip, and IV ceftriaxone. GI was consulted and completed a EGD on 6/2 with no ulcer or infiltrating process seen, but concern for gastric variceal bleed. She was intubated for the procedure and remained intubated post procedure. She received a total of 3u PRBC.  A transfer request to [facility] was placed for need for IR consult for treatment of gastric varix (TIPS vs embo).  PCCM was consulted for management post transfer.  Pertinent  Medical History  Recurrent GIB, hepatic encephalopathy, cirrhosis secondary to NASH, hepatic encephalopathy, osteomyelitis with partial amputation of the left foot.   Significant Hospital Events: Including procedures, antibiotic start and stop dates in addition to other pertinent events   . 6/1 Presented to [facility], Ceftriaxone>, 1 UPRBC given . 6/2 Upper EGD-No ulcer or infiltrating process seen. Concern for gastric variceal bleed. TXR to [facility]  Interim History / Subjective:  Sedated on propofol.  Comfortable. No obvious signs of ongoing bleeding though has no OGT (agree in holding placement for  now).  Objective   Blood pressure (!) 165/63, pulse 83, temperature 97.6 F (36.4 C), temperature source Axillary, resp. rate 18, height 5\' 8"  (1.727 m), weight 79.4 kg, SpO2 100 %.    Vent Mode: PRVC FiO2 (%):  [40 %-50 %] 40 % Set Rate:  [18 bmp] 18 bmp Vt Set:  [400 mL-510 mL] 510 mL PEEP:  [5 cmH20] 5 cmH20 Plateau Pressure:  [13 cmH20] 13 cmH20   Intake/Output Summary (Last 24 hours) at 05/20/2021 1650 Last data filed at 05/20/2021 1641 Gross per 24 hour  Intake 4979.5 ml  Output 1000 ml  Net 3979.5 ml   Filed Weights   05/19/21 1232  Weight: 79.4 kg    Examination: General: Adult female, resting in bed, in NAD. Neuro: Sedated, not following commands. HEENT: [place]/AT. Sclerae anicteric. EOMI. Cardiovascular: RRR, no M/R/G.  Lungs: Respirations even and unlabored.  CTA bilaterally, No W/R/R. Abdomen: BS x 4, soft, NT/ND.  Musculoskeletal: Left transmetatarsal amputation with dressing C/D/I.  R great and 3rd toe amputations, no edema.  Skin: Intact, warm, no rashes.   Labs/imaging that I havepersonally reviewed  (right click and "Reselect all SmartList Selections" daily)  CTA abd / pelvis 6/2 > gastro renal shunt with gastric varices in cardia and fundus, patent portal venous system, no active bleeding.  Hepatic cirrhosis with portal hypertension, splenomegaly, small volume ascites, no evidence of HCC, thickening of sigmoid colon and rectum.  Assessment & Plan:   Acute Upper GI Bleed - felt to be due to gastric varices (no active bleeding  seen on EGD). Acute Blood Loss Anemia - 2/2 above. S/p 1u PRBC. - Day team to please IR for TIPS vs BRTO (no indication for urgent consult tonight). - Continue empiric PPI, octreotide, Cefepime (was already on Cefepime for osteo, see below). - Transfuse PRBC if HBG less than 7. - Stat labs now and follow up CBC in AM.  Liver Cirrhosis secondary to NASH. Hx of Hepatic Encephalopathy. - Supportive care. - No acute interventions  required.  Thrombocytopenia. - Monitor for signs of bleeding.  Left Foot Osteomyelitis. Hospitalized at [facility]/2-5/6, started on IV Vanc and Cefepime with goals for 6 weeks of treatment. - Empiric Vanc and Cefepime as above. - Wound care consult.  Hx DM. - SSI. - Hold home Metformin, Novolog.  Hx HTN, HLD. - Hold home Carvedilol, Furosemide, Pravastatin, Spironolactone.  Best practice (right click and "Reselect all SmartList Selections" daily)  Diet:  NPO Pain/Anxiety/Delirium protocol (if indicated): Yes (RASS goal -1) VAP protocol (if indicated): Yes DVT prophylaxis: Contraindicated GI prophylaxis: PPI Glucose control:  SSI Yes Central venous access:  N/A Arterial line:  N/A Foley:  N/A Mobility:  bed rest  PT consulted: Yes Last date of multidisciplinary goals of care discussion: None yet Code Status:  full code Disposition: ICU  Labs   CBC: Recent Labs  Lab 05/19/21 1525 05/19/21 2302 05/20/21 0600 05/20/21 0905  WBC 2.7* 3.2* 3.8* 5.5  NEUTROABS 1.7  --   --   --   HGB 8.0* 6.7* 6.8* 7.3*  7.3*  HCT 26.5* 23.3* 22.6* 24.1*  24.4*  MCV 98.1 103.1* 99.1 99.2  PLT 62* 61* 70* 93*    Basic Metabolic Panel: Recent Labs  Lab 05/19/21 1525 05/20/21 0600  NA 138 142  K 5.1 4.2  CL 106 111  CO2 25 23  GLUCOSE 319* 213*  BUN 32* 44*  CREATININE 0.72 0.75  CALCIUM 8.6* 8.5*   GFR: Estimated Creatinine Clearance: 76.6 mL/min (by C-G formula based on SCr of 0.75 mg/dL). Recent Labs  Lab 05/19/21 1525 05/19/21 2302 05/20/21 0600 05/20/21 0905  WBC 2.7* 3.2* 3.8* 5.5    Liver Function Tests: Recent Labs  Lab 05/19/21 1525  AST 71*  ALT 41  ALKPHOS 107  BILITOT 1.6*  PROT 5.7*  ALBUMIN 2.3*   Recent Labs  Lab 05/19/21 1525  LIPASE 24   Recent Labs  Lab 05/19/21 2302 05/20/21 0905  AMMONIA 94* 53*    ABG No results found for: PHART, PCO2ART, PO2ART, HCO3, TCO2, ACIDBASEDEF, O2SAT   Coagulation Profile: Recent Labs  Lab  05/19/21 1525 05/20/21 0905  INR 1.7* 1.6*    Cardiac Enzymes: No results for input(s): CKTOTAL, CKMB, CKMBINDEX, TROPONINI in the last 168 hours.  HbA1C: No results found for: HGBA1C  CBG: Recent Labs  Lab 05/20/21 0016 05/20/21 0402 05/20/21 0829 05/20/21 1047 05/20/21 1549  GLUCAP 216* 177* 205* 185* 151*    Review of Systems:   Unable to obtain as pt is encephalopathic.  Past Medical History:  She,  has a past medical history of Anemia, Aortic atherosclerosis (HCC), AVM (arteriovenous malformation) of colon, Essential hypertension, GI bleeding, Hepatic encephalopathy (HCC), History of RSV infection, Liver cirrhosis secondary to NASH (HCC) (2004), Obesity, Osteomyelitis (HCC), Thrombocytopenia (HCC), and Type 2 diabetes mellitus (HCC).   Surgical History:   Past Surgical History:  Procedure Laterality Date  . ABDOMINAL HYSTERECTOMY    . COLONOSCOPY    . Cystoscopy with ureteral stent    . ESOPHAGOGASTRODUODENOSCOPY    .  Toe amputations Bilateral   . TUBAL LIGATION       Social History:   reports that she has quit smoking. Her smoking use included cigarettes. She has never used smokeless tobacco. She reports current alcohol use. She reports that she does not use drugs.   Family History:  Her family history includes Diabetes Mellitus II in her mother; Heart failure in her father and mother; Hyperlipidemia in her father and mother.   Allergies No Known Allergies   Home Medications  Prior to Admission medications   Medication Sig Start Date End Date Taking? Authorizing Provider  albuterol (VENTOLIN HFA) 108 (90 Base) MCG/ACT inhaler Inhale 2 puffs into the lungs every 4 (four) hours as needed. 01/11/21   [provider]  benzonatate (TESSALON) 100 MG capsule Take 100 mg by mouth 3 (three) times daily as needed. 02/05/21   [provider]  carvedilol (COREG) 3.125 MG tablet Take 3.125 mg by mouth 2 (two) times daily. 02/20/21   [provider]  clotrimazole-betamethasone (LOTRISONE) cream Apply topically 2 (two) times daily. 02/20/21   [provider]  furosemide (LASIX) 20 MG tablet Take 1 tablet by mouth daily. 03/21/21   [provider]  gabapentin (NEURONTIN) 800 MG tablet Take 800 mg by mouth 3 (three) times daily. 05/11/21   [provider]  hydrOXYzine (ATARAX/VISTARIL) 25 MG tablet Take 25 mg by mouth 3 (three) times daily. 02/20/21   [provider]  insulin glargine, 2 Unit Dial, (TOUJEO MAX SOLOSTAR) 300 UNIT/ML Solostar Pen 60 Units daily. 06/26/20   [provider]  lactulose (CHRONULAC) 10 GM/15ML solution Take 20 g by mouth 2 (two) times daily. 03/21/21   [provider]  metFORMIN (GLUCOPHAGE) 1000 MG tablet Take 1 tablet by mouth 2 (two) times daily. 05/10/21   [provider]  NOVOLOG FLEXPEN 100 UNIT/ML FlexPen Inject 4 Units into the skin in the morning, at noon, and at bedtime. 03/21/21   [provider]  pantoprazole (PROTONIX) 40 MG tablet Take 1 tablet by mouth 2 (two) times daily. 03/21/21   [provider]  pravastatin (PRAVACHOL) 20 MG tablet Take 20 mg by mouth at bedtime. 12/14/20   [provider]  sertraline (ZOLOFT) 100 MG tablet Take 1.5 tablets by mouth daily. 03/09/21   [provider]  spironolactone (ALDACTONE) 50 MG tablet Take 1 tablet by mouth daily. 03/21/21   [provider]  vancomycin (VANCOCIN) 10 G SOLR injection Inject 1 g into the vein in the morning and at bedtime. 05/13/21   [provider]  Vitamin D, Ergocalciferol, (DRISDOL) 1.25 MG (50000 UNIT) CAPS capsule Take 1 capsule by mouth once a week. 04/05/21   [provider]     Critical care time: 45 min.    Rutherford Guys, PA - [facility] For pager details, please see AMION or use Epic chat  After 1900, please call [facility] for cross coverage needs 05/20/2021, 9:21 PM

## 2021-05-20 NOTE — Progress Notes (Signed)
eLink Physician-Brief Progress Note Patient Name: Ariel Arnold DOB: [birth year] MRN: [id number]   Date of Service  05/20/2021  HPI/Events of Note  Patient transferred to [facility] from [facility] due to GI bleeding,  with EGD suggestive of bleeding from esophageal varices as etiology, patient has a history of cirrhosis of the liver secondary to NASH, transfer was for possible TIPS.  eICU Interventions  New Patient Evaluation.        Thomasene Lot Naomia Lenderman 05/20/2021, 9:55 PM

## 2021-05-20 NOTE — Progress Notes (Signed)
PT Cancellation Note  Patient Details Name: Ariel Arnold MRN: [id number] DOB: 1956-08-20   Cancelled Treatment:    Reason Eval/Treat Not Completed: Medical issues which prohibited therapy.  Patient transferred to a higher level of care and will need new PT consult resume therapy when patient is medically stable.  Thank you.   10:06 AM, 05/20/21 Ocie Bob, MPT Physical Therapist with [facility] [phone] office [phone] mobile phone

## 2021-05-21 ENCOUNTER — Encounter (HOSPITAL_COMMUNITY)
Admission: EM | Disposition: A | Discharge status: Home Health | Payer: Self-pay | Source: Home / Self Care | Attending: Critical Care Medicine

## 2021-05-21 ENCOUNTER — Inpatient Hospital Stay (HOSPITAL_COMMUNITY): Payer: Medicare Other

## 2021-05-21 ENCOUNTER — Inpatient Hospital Stay (HOSPITAL_COMMUNITY): Payer: Medicare Other | Admitting: Registered Nurse

## 2021-05-21 ENCOUNTER — Encounter (HOSPITAL_COMMUNITY): Payer: Self-pay | Admitting: Family Medicine

## 2021-05-21 DIAGNOSIS — L8990 Pressure ulcer of unspecified site, unspecified stage: Secondary | ICD-10-CM | POA: Insufficient documentation

## 2021-05-21 DIAGNOSIS — K920 Hematemesis: Secondary | ICD-10-CM | POA: Diagnosis not present

## 2021-05-21 HISTORY — PX: IR VENOGRAM RENAL UNI LEFT: IMG680

## 2021-05-21 HISTORY — PX: IR US GUIDE VASC ACCESS RIGHT: IMG2390

## 2021-05-21 HISTORY — PX: IR EMBO VENOUS NOT HEMORR HEMANG  INC GUIDE ROADMAPPING: IMG5447

## 2021-05-21 HISTORY — PX: IR ANGIOGRAM SELECTIVE EACH ADDITIONAL VESSEL: IMG667

## 2021-05-21 HISTORY — PX: IR ANGIOGRAM FOLLOW UP STUDY: IMG697

## 2021-05-21 HISTORY — PX: RADIOLOGY WITH ANESTHESIA: SHX6223

## 2021-05-21 LAB — POCT I-STAT 7, (LYTES, BLD GAS, ICA,H+H)
Acid-base deficit: 4 mmol/L — ABNORMAL HIGH (ref 0.0–2.0)
Bicarbonate: 22 mmol/L (ref 20.0–28.0)
Calcium, Ion: 1.13 mmol/L — ABNORMAL LOW (ref 1.15–1.40)
HCT: 21 % — ABNORMAL LOW (ref 36.0–46.0)
Hemoglobin: 7.1 g/dL — ABNORMAL LOW (ref 12.0–15.0)
O2 Saturation: 100 %
Potassium: 3.3 mmol/L — ABNORMAL LOW (ref 3.5–5.1)
Sodium: 146 mmol/L — ABNORMAL HIGH (ref 135–145)
TCO2: 23 mmol/L (ref 22–32)
pCO2 arterial: 41.1 mmHg (ref 32.0–48.0)
pH, Arterial: 7.337 — ABNORMAL LOW (ref 7.350–7.450)
pO2, Arterial: 182 mmHg — ABNORMAL HIGH (ref 83.0–108.0)

## 2021-05-21 LAB — TYPE AND SCREEN
ABO/RH(D): A POS
Antibody Screen: NEGATIVE
Unit division: 0
Unit division: 0
Unit division: 0

## 2021-05-21 LAB — COMPREHENSIVE METABOLIC PANEL
ALT: 31 U/L (ref 0–44)
AST: 39 U/L (ref 15–41)
Albumin: 2.2 g/dL — ABNORMAL LOW (ref 3.5–5.0)
Alkaline Phosphatase: 73 U/L (ref 38–126)
Anion gap: 5 (ref 5–15)
BUN: 48 mg/dL — ABNORMAL HIGH (ref 8–23)
CO2: 23 mmol/L (ref 22–32)
Calcium: 7.7 mg/dL — ABNORMAL LOW (ref 8.9–10.3)
Chloride: 114 mmol/L — ABNORMAL HIGH (ref 98–111)
Creatinine, Ser: 0.91 mg/dL (ref 0.44–1.00)
GFR, Estimated: >60 mL/min (ref >60)
Glucose, Bld: 79 mg/dL (ref 70–99)
Potassium: 3 mmol/L — ABNORMAL LOW (ref 3.5–5.1)
Sodium: 142 mmol/L (ref 135–145)
Total Bilirubin: 1.5 mg/dL — ABNORMAL HIGH (ref 0.3–1.2)
Total Protein: 5.2 g/dL — ABNORMAL LOW (ref 6.5–8.1)

## 2021-05-21 LAB — BPAM RBC
Blood Product Expiration Date: 202206232359
Blood Product Expiration Date: 202206242359
Blood Product Expiration Date: 202206242359
ISSUE DATE / TIME: 202206020211
ISSUE DATE / TIME: 202206020952
ISSUE DATE / TIME: 202206021413
Unit Type and Rh: 6200
Unit Type and Rh: 6200
Unit Type and Rh: 6200

## 2021-05-21 LAB — TRIGLYCERIDES: Triglycerides: 119 mg/dL (ref <150)

## 2021-05-21 LAB — MAGNESIUM: Magnesium: 1.4 mg/dL — ABNORMAL LOW (ref 1.7–2.4)

## 2021-05-21 LAB — CBC
HCT: 25.4 % — ABNORMAL LOW (ref 36.0–46.0)
Hemoglobin: 8.2 g/dL — ABNORMAL LOW (ref 12.0–15.0)
MCH: 30 pg (ref 26.0–34.0)
MCHC: 32.3 g/dL (ref 30.0–36.0)
MCV: 93 fL (ref 80.0–100.0)
Platelets: 84 10*3/uL — ABNORMAL LOW (ref 150–400)
RBC: 2.73 MIL/uL — ABNORMAL LOW (ref 3.87–5.11)
RDW: 21.9 % — ABNORMAL HIGH (ref 11.5–15.5)
WBC: 4.3 10*3/uL (ref 4.0–10.5)
nRBC: 0 % (ref 0.0–0.2)

## 2021-05-21 LAB — PREPARE RBC (CROSSMATCH)

## 2021-05-21 LAB — MRSA PCR SCREENING
MRSA by PCR: POSITIVE — AB
MRSA by PCR: NEGATIVE

## 2021-05-21 LAB — GLUCOSE, CAPILLARY
Glucose-Capillary: 78 mg/dL (ref 70–99)
Glucose-Capillary: 73 mg/dL (ref 70–99)
Glucose-Capillary: 111 mg/dL — ABNORMAL HIGH (ref 70–99)
Glucose-Capillary: 99 mg/dL (ref 70–99)
Glucose-Capillary: 120 mg/dL — ABNORMAL HIGH (ref 70–99)

## 2021-05-21 LAB — AMMONIA: Ammonia: 41 umol/L — ABNORMAL HIGH (ref 9–35)

## 2021-05-21 SURGERY — IR WITH ANESTHESIA
Anesthesia: General

## 2021-05-21 MED ORDER — ROCURONIUM BROMIDE 100 MG/10ML IV SOLN
INTRAVENOUS | Status: DC | PRN
  Administered 2021-05-21: 50 mg via INTRAVENOUS

## 2021-05-21 MED ORDER — IOHEXOL 300 MG/ML  SOLN
100.0000 mL | Freq: Once | INTRAMUSCULAR | Status: AC | PRN
  Administered 2021-05-21: 70 mL via INTRAVENOUS

## 2021-05-21 MED ORDER — PHENYLEPHRINE HCL-NACL 10-0.9 MG/250ML-% IV SOLN
INTRAVENOUS | Status: DC | PRN
  Administered 2021-05-21: 15 ug/min via INTRAVENOUS

## 2021-05-21 MED ORDER — PHENYLEPHRINE HCL (PRESSORS) 10 MG/ML IV SOLN
INTRAVENOUS | Status: DC | PRN
  Administered 2021-05-21: 60 ug via INTRAVENOUS

## 2021-05-21 MED ORDER — POTASSIUM CHLORIDE 10 MEQ/50ML IV SOLN
10.0000 meq | INTRAVENOUS | Status: AC
Start: 2021-05-21 — End: 2021-05-22
  Administered 2021-05-21 (×4): 10 meq via INTRAVENOUS
  Filled 2021-05-21 (×2): qty 50

## 2021-05-21 MED ORDER — CHLORHEXIDINE GLUCONATE CLOTH 2 % EX PADS
6.0000 | MEDICATED_PAD | Freq: Every day | CUTANEOUS | Status: DC
  Administered 2021-05-21 – 2021-05-24 (×4): 6 via TOPICAL

## 2021-05-21 MED ORDER — ONDANSETRON HCL 4 MG/2ML IJ SOLN
INTRAMUSCULAR | Status: DC | PRN
  Administered 2021-05-21: 4 mg via INTRAVENOUS

## 2021-05-21 MED ORDER — SODIUM CHLORIDE 0.9 % IV SOLN: INTRAVENOUS | Status: DC | PRN

## 2021-05-21 MED ORDER — SODIUM CHLORIDE 0.9% IV SOLUTION: Freq: Once | INTRAVENOUS | Status: AC

## 2021-05-21 MED ORDER — FENTANYL CITRATE (PF) 100 MCG/2ML IJ SOLN
INTRAMUSCULAR | Status: DC | PRN
  Administered 2021-05-21 (×2): 50 ug via INTRAVENOUS

## 2021-05-21 MED ORDER — FENTANYL CITRATE (PF) 100 MCG/2ML IJ SOLN
INTRAMUSCULAR | Status: AC
  Filled 2021-05-21: qty 2

## 2021-05-21 MED ORDER — POTASSIUM CHLORIDE 10 MEQ/50ML IV SOLN
10.0000 meq | INTRAVENOUS | Status: DC
  Administered 2021-05-21 (×2): 10 meq via INTRAVENOUS
  Filled 2021-05-21 (×4): qty 50

## 2021-05-21 MED ORDER — MAGNESIUM SULFATE 4 GM/100ML IV SOLN
4.0000 g | Freq: Once | INTRAVENOUS | Status: AC
  Administered 2021-05-21: 4 g via INTRAVENOUS
  Filled 2021-05-21: qty 100

## 2021-05-21 MED ORDER — MUPIROCIN 2 % EX OINT
1.0000 "application " | TOPICAL_OINTMENT | Freq: Two times a day (BID) | CUTANEOUS | Status: AC
  Administered 2021-05-21 – 2021-05-25 (×10): 1 via NASAL
  Filled 2021-05-21: qty 22

## 2021-05-21 MED ORDER — DEXAMETHASONE SODIUM PHOSPHATE 4 MG/ML IJ SOLN
INTRAMUSCULAR | Status: DC | PRN
  Administered 2021-05-21: 4 mg via INTRAVENOUS

## 2021-05-21 MED ORDER — IOHEXOL 300 MG/ML  SOLN
100.0000 mL | Freq: Once | INTRAMUSCULAR | Status: AC | PRN
  Administered 2021-05-21: 25 mL via INTRAVENOUS

## 2021-05-21 NOTE — Consult Note (Signed)
Chief Complaint: Patient was seen in consultation today for GI bleed  Referring Physician(s): Dr. Jena Gauss  Supervising Physician: Oley Balm  Patient Status: [facility] - In-pt  History of Present Illness: Ariel Arnold is a 65 y.o. female with past medical history of HTN,  DM II, thrombocytopenia, osteomyelitis, cirrhosis secondary to NASH, hepatic encephalopathy. He has a history of recurrent GI bleeding, last seen at [facility] in March 2022 for same.  She presented to [facility] yesterday after 2 episodes of hematemesis, melena.  She underwent EGD yesterday with no gastric/duodenal source of bleeding identified.  In setting of known recurrent bleeding gastric varices IR was consulted for possible BRTO.    CTA Abdomen Pelvis yesterday: 1. Positive for gastro renal shunt with associated gastric varices in the cardia and fundus. The primary afferent portal venous inflow appears to be via the posterior gastric vein. Suspect some accessory contribution from short gastric veins as well. 2. Widely patent portal venous system. 3. No active bleeding at the time of the examination. 4. Multifocal atherosclerotic calcifications and plaque without evidence of significant stenosis, occlusion or dissection.  Case reviewed by Dr. Deanne Coffer and Dr. Fredia Sorrow who approve patient for BRTO/TIPS procedure.  Ariel Arnold was transferred to [facility] from [facility] overnight.  She remains intubated after EGD yesterday.  OGT in place without signs of BRB.  Rectal tube in place with melena ongoing.   Past Medical History:  Diagnosis Date  . Anemia   . Aortic atherosclerosis (HCC)   . AVM (arteriovenous malformation) of colon   . Essential hypertension   . GI bleeding    Recurrent  . Hepatic encephalopathy (HCC)   . History of RSV infection   . Liver cirrhosis secondary to NASH (HCC) 2004   Biopsy-proven  . Obesity   . Osteomyelitis (HCC)   . Thrombocytopenia (HCC)   . Type 2 diabetes mellitus (HCC)     Past  Surgical History:  Procedure Laterality Date  . ABDOMINAL HYSTERECTOMY    . COLONOSCOPY    . Cystoscopy with ureteral stent    . ESOPHAGOGASTRODUODENOSCOPY    . Toe amputations Bilateral   . TUBAL LIGATION      Allergies: Patient has no known allergies.  Medications: Prior to Admission medications   Medication Sig Start Date End Date Taking? Authorizing Provider  albuterol (VENTOLIN HFA) 108 (90 Base) MCG/ACT inhaler Inhale 2 puffs into the lungs every 4 (four) hours as needed. 01/11/21   [provider]  benzonatate (TESSALON) 100 MG capsule Take 100 mg by mouth 3 (three) times daily as needed. 02/05/21   [provider]  carvedilol (COREG) 3.125 MG tablet Take 3.125 mg by mouth 2 (two) times daily. 02/20/21   [provider]  clotrimazole-betamethasone (LOTRISONE) cream Apply topically 2 (two) times daily. 02/20/21   [provider]  furosemide (LASIX) 20 MG tablet Take 1 tablet by mouth daily. 03/21/21   [provider]  gabapentin (NEURONTIN) 800 MG tablet Take 800 mg by mouth 3 (three) times daily. 05/11/21   [provider]  hydrOXYzine (ATARAX/VISTARIL) 25 MG tablet Take 25 mg by mouth 3 (three) times daily. 02/20/21   [provider]  insulin glargine, 2 Unit Dial, (TOUJEO MAX SOLOSTAR) 300 UNIT/ML Solostar Pen 60 Units daily. 06/26/20   [provider]  lactulose (CHRONULAC) 10 GM/15ML solution Take 20 g by mouth 2 (two) times daily. 03/21/21   [provider]  metFORMIN (GLUCOPHAGE) 1000 MG tablet Take 1 tablet by mouth 2 (two)  times daily. 05/10/21   [provider]  NOVOLOG FLEXPEN 100 UNIT/ML FlexPen Inject 4 Units into the skin in the morning, at noon, and at bedtime. 03/21/21   [provider]  pantoprazole (PROTONIX) 40 MG tablet Take 1 tablet by mouth 2 (two) times daily. 03/21/21   [provider]  pravastatin (PRAVACHOL) 20 MG tablet Take 20 mg by mouth at bedtime. 12/14/20    [provider]  sertraline (ZOLOFT) 100 MG tablet Take 1.5 tablets by mouth daily. 03/09/21   [provider]  spironolactone (ALDACTONE) 50 MG tablet Take 1 tablet by mouth daily. 03/21/21   [provider]  vancomycin (VANCOCIN) 10 G SOLR injection Inject 1 g into the vein in the morning and at bedtime. 05/13/21   [provider]  Vitamin D, Ergocalciferol, (DRISDOL) 1.25 MG (50000 UNIT) CAPS capsule Take 1 capsule by mouth once a week. 04/05/21   [provider]     Family History  Problem Relation Age of Onset  . Heart failure Mother   . Hyperlipidemia Mother   . Diabetes Mellitus II Mother   . Heart failure Father   . Hyperlipidemia Father     Social History   Socioeconomic History  . Marital status: Unknown    Spouse name: Not on file  . Number of children: Not on file  . Years of education: Not on file  . Highest education level: Not on file  Occupational History  . Not on file  Tobacco Use  . Smoking status: Former Smoker    Types: Cigarettes  . Smokeless tobacco: Never Used  Vaping Use  . Vaping Use: Never used  Substance and Sexual Activity  . Alcohol use: Yes  . Drug use: Never  . Sexual activity: Not on file  Other Topics Concern  . Not on file  Social History Narrative  . Not on file   Social Determinants of Health   Financial Resource Strain: Not on file  Food Insecurity: Not on file  Transportation Needs: Not on file  Physical Activity: Not on file  Stress: Not on file  Social Connections: Not on file     Review of Systems: A 12 point ROS discussed and pertinent positives are indicated in the HPI above.  All other systems are negative.  Review of Systems  Unable to perform ROS: Intubated    Vital Signs: BP (!) 132/55   Pulse 76   Temp (!) 97.5 F (36.4 C) (Axillary)   Resp 18   Ht  (1.727 m)   Wt 175 lb (79.4 kg)   SpO2 99%   BMI 26.61 kg/m   Physical Exam Vitals and nursing note  reviewed.  Constitutional:      General: She is not in acute distress.    Comments: Intubated/sedated  Cardiovascular:     Rate and Rhythm: Normal rate and regular rhythm.  Pulmonary:     Breath sounds: Normal breath sounds. No rhonchi.  Abdominal:     General: There is no distension.     Palpations: Abdomen is soft.     Comments: Rectal tube with melena  Skin:    General: Skin is warm and dry.      MD Evaluation Airway: Other (comments) (intubated) Heart: WNL Abdomen: WNL Chest/ Lungs: WNL ASA  Classification: 3 Mallampati/Airway Score: Three (intubated)   Imaging: DG Ribs Unilateral Right  Result Date: 05/19/2021 CLINICAL DATA:  Right-sided rib pain following fall, initial encounter EXAM: RIGHT RIBS - 2  VIEW COMPARISON:  None. FINDINGS: Mild irregularity of the anterior aspect of the right fifth through seventh ribs is noted consistent with acute fracture. No pneumothorax is seen. No other focal abnormality is noted. IMPRESSION: Right-sided rib fractures as described without complicating factors. Electronically Signed   By: Alcide Clever M.D.   On: 05/19/2021 17:59   DG Ribs Unilateral W/Chest Left  Result Date: 05/19/2021 CLINICAL DATA:  Recent fall with bilateral rib pain, initial encounter EXAM: LEFT RIBS AND CHEST - 3+ VIEW COMPARISON:  None. FINDINGS: Minimally displaced left fifth rib fracture is noted anteriorly. No other fractures are seen. No pneumothorax is noted. Right-sided PICC line is noted at the junction of the innominate veins. IMPRESSION: Left fifth rib fracture without complicating factors. Electronically Signed   By: Alcide Clever M.D.   On: 05/19/2021 17:58   CT Head Wo Contrast  Result Date: 05/19/2021 CLINICAL DATA:  Head trauma EXAM: CT HEAD WITHOUT CONTRAST TECHNIQUE: Contiguous axial images were obtained from the base of the skull through the vertex without intravenous contrast. COMPARISON:  04/01/2021 FINDINGS: Brain: Mild age related volume loss. No  acute intracranial abnormality. Specifically, no hemorrhage, hydrocephalus, mass lesion, acute infarction, or significant intracranial injury. Vascular: No hyperdense vessel or unexpected calcification. Skull: No acute calvarial abnormality. Sinuses/Orbits: No acute findings Other: None IMPRESSION: No acute intracranial abnormality. Electronically Signed   By: Charlett Nose M.D.   On: 05/19/2021 17:23   DG Chest Port 1 View  Result Date: 05/20/2021 CLINICAL DATA:  Central line placement. EXAM: PORTABLE CHEST 1 VIEW COMPARISON:  Rib radiographs earlier today. FINDINGS: New left internal jugular central line tip at the atrial caval junction. No pneumothorax. Right upper extremity central line tip extends to the subclavian vein. Endotracheal tube is approximately 2.4 cm from the carina. Upper normal heart size. Aortic atherosclerosis. Patchy retrocardiac opacity. No significant pleural effusion. Right rib fractures are better seen on recent rib radiographs. IMPRESSION: 1. New left internal jugular central line with tip at the atrial caval junction. No pneumothorax. 2. Right upper extremity central line with tip in the subclavian vein. Endotracheal tube 2.4 cm from the carina. 3. Patchy retrocardiac opacity, favoring atelectasis. Electronically Signed   By: Narda Rutherford M.D.   On: 05/20/2021 18:37   DG Knee Complete 4 Views Right  Result Date: 05/19/2021 CLINICAL DATA:  Recent fall with right knee pain, initial encounter EXAM: RIGHT KNEE - COMPLETE 4+ VIEW COMPARISON:  None. FINDINGS: Meniscal calcification is identified. No fracture or dislocation is seen. No joint effusion is noted. Mild soft tissue swelling is noted anteriorly consistent with the recent injury. IMPRESSION: Soft tissue swelling without acute bony abnormality. Electronically Signed   By: Alcide Clever M.D.   On: 05/19/2021 17:56   DG Abd Portable 1V  Result Date: 05/20/2021 CLINICAL DATA:  Check gastric catheter placement EXAM: PORTABLE  ABDOMEN - 1 VIEW COMPARISON:  None. FINDINGS: Gastric catheter is noted in the distal stomach. Nonobstructive bowel gas pattern is seen. IMPRESSION: Gastric catheter in the distal stomach. Electronically Signed   By: Alcide Clever M.D.   On: 05/20/2021 22:27   CT Angio Abd/Pel w/ and/or w/o  Result Date: 05/20/2021 CLINICAL DATA:  65 year old female with NASH cirrhosis, portal hypertension, hematemesis and melena. EXAM: CTA ABDOMEN AND PELVIS WITHOUT AND WITH CONTRAST TECHNIQUE: Multidetector CT imaging of the abdomen and pelvis was performed using the standard protocol during bolus administration of intravenous contrast. Multiplanar reconstructed images and MIPs were obtained and reviewed to evaluate the  vascular anatomy. CONTRAST:  100mL OMNIPAQUE IOHEXOL 350 MG/ML SOLN COMPARISON:  Prior CT a abdomen and pelvis 03/12/2021 FINDINGS: VASCULAR Aorta: Normal caliber aorta without aneurysm, dissection, vasculitis or significant stenosis. Scattered atherosclerotic vascular calcifications. Celiac: Patent without evidence of aneurysm, dissection, vasculitis or significant stenosis. SMA: Patent without evidence of aneurysm, dissection, vasculitis or significant stenosis. Renals: Both renal arteries are patent without evidence of aneurysm, dissection, vasculitis, fibromuscular dysplasia or significant stenosis. IMA: Patent without evidence of aneurysm, dissection, vasculitis or significant stenosis. Inflow: Patent without evidence of aneurysm, dissection, vasculitis or significant stenosis. Proximal Outflow: Bilateral common femoral and visualized portions of the superficial and profunda femoral arteries are patent without evidence of aneurysm, dissection, vasculitis or significant stenosis. Veins: Widely patent main and intrahepatic portal veins. The main portal vein is enlarged at 1.7 cm in diameter. The splenic vein remains patent. Positive for gastro renal shunt with associated gastric varices in the cardia and  fundus. Primary Afrin vein appears to be the posterior gastric vein. There are also likely some small short gastric contributors. Small recanalized paraumbilical vein. Review of the MIP images confirms the above findings. NON-VASCULAR Lower chest: Left greater than right lower lobe dependent atelectasis. Small volume aspiration not excluded. The heart is normal in size. Calcification of the mitral valve annulus. No pericardial effusion. Unremarkable distal thoracic esophagus. Hepatobiliary: Nodular hepatic contour with morphologic changes consistent with hepatic cirrhosis. No evidence of arterially enhancing lesion to suggest hepatocellular carcinoma. The gallbladder is surgically absent. No intra or extrahepatic biliary ductal dilatation. Pancreas: Unremarkable. No pancreatic ductal dilatation or surrounding inflammatory changes. Spleen: Splenomegaly. Adrenals/Urinary Tract: 1.5 cm right adrenal nodule insignificantly changed dating back to July of 2021. Favor benign adenoma. The left adrenal gland is normal. No hydronephrosis, nephrolithiasis or enhancing renal mass. Ureters and bladder are unremarkable. Stomach/Bowel: Rectal tube in place. Diffuse submucosal thickening of the rectum, sigmoid colon and to a lesser extent the descending colon. No evidence of bowel obstruction. Gastric varices are present. The duodenum and proximal jejunum are unremarkable. Lymphatic: No suspicious lymphadenopathy. Reproductive: Status post hysterectomy. No adnexal masses. Other: Small volume ascites. Musculoskeletal: No acute fracture or aggressive appearing lytic or blastic osseous lesion. Multilevel degenerative disc disease. IMPRESSION: VASCULAR 1. Positive for gastro renal shunt with associated gastric varices in the cardia and fundus. The primary afferent portal venous inflow appears to be via the posterior gastric vein. Suspect some accessory contribution from short gastric veins as well. 2. Widely patent portal venous  system. 3. No active bleeding at the time of the examination. 4. Multifocal atherosclerotic calcifications and plaque without evidence of significant stenosis, occlusion or dissection. 5.  Aortic Atherosclerosis (ICD10-I70.0). NON-VASCULAR 1. Hepatic cirrhosis with portal hypertension, splenomegaly and small volume ascites. 2. No evidence of hepatocellular carcinoma. 3. Diffuse submucosal thickening of the sigmoid colon and rectum. Differential considerations include infectious/inflammatory colitis versus portal colopathy. 4. Additional ancillary findings as above. Signed, Sterling BigHeath K. McCullough, MD, RPVI [facility] Electronically Signed   By: Malachy MoanHeath  McCullough M.D.   On: 05/20/2021 16:51    Labs:  CBC: Recent Labs    05/20/21 0600 05/20/21 0905 05/20/21 2104 05/21/21 0640  WBC 3.8* 5.5 5.8 4.3  HGB 6.8* 7.3*  7.3* 8.8* 8.2*  HCT 22.6* 24.1*  24.4* 27.4* 25.4*  PLT 70* 93* 91* 84*    COAGS: Recent Labs    05/19/21 1525 05/20/21 0905 05/20/21 2104  INR 1.7* 1.6* 1.6*  APTT  --  38*  --  BMP: Recent Labs    05/19/21 1525 05/20/21 0600 05/20/21 2104 05/21/21 0640  NA 138 142 141 142  K 5.1 4.2 3.4* 3.0*  CL 106 111 110 114*  CO2 25 23 23 23   GLUCOSE 319* 213* 127* 79  BUN 32* 44* 49* 48*  CALCIUM 8.6* 8.5* 8.2* 7.7*  CREATININE 0.72 0.75 0.84 0.91  GFRNONAA >60 >60 >60 >60    LIVER FUNCTION TESTS: Recent Labs    05/19/21 1525 05/20/21 2104 05/21/21 0640  BILITOT 1.6* 1.6* 1.5*  AST 71* 39 39  ALT 41 34 31  ALKPHOS 107 96 73  PROT 5.7* 5.6* 5.2*  ALBUMIN 2.3* 2.4* 2.2*    TUMOR MARKERS: No results for input(s): AFPTM, CEA, CA199, CHROMGRNA in the last 8760 hours.  Assessment and Plan: GI bleed Patient with history gastric varices and recurrent GI bleeding.  Presented to [facility] yesterday with melena and hematemesis.  S/p EGD with suspected variceal bleeding, no other source found.  Referred to IR  for BRTO/TIPS.  Transferred to [facility], remains intubated for procedure.  Case reviewed by Dr. 07/21/21 who feels patient is appropriate for BRTO and recommends proceeding with intervention today.  Discussed with daughter, Deanne Coffer by telephone.  She is understanding of the goals of the procedure and provides consent.  SCr 0.91, Hgb 8.3, plts 84  Risks and benefits of TIPS, BRTO and/or additional variceal embolization were discussed with the patient and/or the patient's family including, but not limited to, infection, bleeding, damage to adjacent structures, worsening hepatic and/or cardiac function, worsening and/or the development of altered mental status/encephalopathy, non-target embolization and death.   This interventional procedure involves the use of X-rays and because of the nature of the planned procedure, it is possible that we will have prolonged use of X-ray fluoroscopy.  Potential radiation risks to you include (but are not limited to) the following: - A slightly elevated risk for cancer  several years later in life. This risk is typically less than 0.5% percent. This risk is low in comparison to the normal incidence of human cancer, which is 33% for women and 50% for men according to the American Cancer Society. - Radiation induced injury can include skin redness, resembling a rash, tissue breakdown / ulcers and hair loss (which can be temporary or permanent).   The likelihood of either of these occurring depends on the difficulty of the procedure and whether you are sensitive to radiation due to previous procedures, disease, or genetic conditions.   IF your procedure requires a prolonged use of radiation, you will be notified and given written instructions for further action.  It is your responsibility to monitor the irradiated area for the 2 weeks following the procedure and to notify your physician if you are concerned that you have suffered a radiation induced injury.    All of the  patient's questions were answered, patient is agreeable to proceed.  Consent signed and in chart.  Thank you for this interesting consult.  I greatly enjoyed meeting CHRISTALYNN BOISE and look forward to participating in their care.  A copy of this report was sent to the requesting provider on this date.  Electronically Signed: Valente David, PA 05/21/2021, 10:43 AM   I spent a total of 40 Minutes    in face to face in clinical consultation, greater than 50% of which was counseling/coordinating care for GI bleed.

## 2021-05-21 NOTE — Anesthesia Procedure Notes (Signed)
Performed by: Adriyanna Christians B, CRNA       

## 2021-05-21 NOTE — Anesthesia Preprocedure Evaluation (Signed)
Anesthesia Evaluation  Patient identified by MRN, date of birth, ID band Patient unresponsive    Reviewed: Allergy & Precautions, H&P , NPO status , Patient's Chart, lab work & pertinent test results, Unable to perform ROS - Chart review onlyPreop documentation limited or incomplete due to emergent nature of procedure.  History of Anesthesia Complications Negative for: history of anesthetic complications  Airway Mallampati: Intubated       Dental   Pulmonary former smoker,  Covid-19 Nucleic Acid Test Results Lab Results      Component                Value               Date                      SARSCOV2NAA              NEGATIVE            05/19/2021            intubated   breath sounds clear to auscultation       Cardiovascular hypertension, Pt. on medications and Pt. on home beta blockers  Rhythm:regular Rate:Normal     Neuro/Psych negative neurological ROS  negative psych ROS   GI/Hepatic (+) Cirrhosis   Esophageal Varices    , Hepatitis -, Autoimmune  Endo/Other  negative endocrine ROSdiabetes, Insulin DependentNo results found for: HGBA1C   Renal/GU negative Renal ROSLab Results      Component                Value               Date                      CREATININE               0.91                05/21/2021                Musculoskeletal   Abdominal   Peds  Hematology  (+) Blood dyscrasia, anemia , Lab Results      Component                Value               Date                      WBC                      4.3                 05/21/2021                HGB                      8.2 (L)             05/21/2021                HCT                      25.4 (L)            05/21/2021                MCV  93.0                05/21/2021                PLT                      84 (L)              05/21/2021             Anesthesia Other Findings History of gastric varices   Reproductive/Obstetrics                             Anesthesia Physical Anesthesia Plan  ASA: IV  Anesthesia Plan: General   Post-op Pain Management:    Induction: Intravenous  PONV Risk Score and Plan: 3 and Treatment may vary due to age or medical condition  Airway Management Planned: Oral ETT  Additional Equipment: Arterial line  Intra-op Plan:   Post-operative Plan: Post-operative intubation/ventilation  Informed Consent:     History available from chart only  Plan Discussed with: CRNA and Surgeon  Anesthesia Plan Comments:         Anesthesia Quick Evaluation

## 2021-05-21 NOTE — Progress Notes (Signed)
Spoke with the pt's daughter regarding updating family via phone call. The daughter states explicitly that she is the pt's primary care giver and would prefer all updates, information, and any urgent matters be addressed to her. Creating a password to protect the pt information from other family members who call was offered but the pt's daughter states that she does not want to enact such at this time. Explained to the pt's daughter that the creation of a password can be revisited if the request cannot be met without such.

## 2021-05-21 NOTE — Sedation Documentation (Signed)
Handoff with bedside RN. Patient had venous access for the case, pressure held at site until 1634. Dressing at site is clean, dry, intact.

## 2021-05-21 NOTE — Transfer of Care (Signed)
Immediate Anesthesia Transfer of Care Note  Patient: Ariel Arnold  Procedure(s) Performed: IR WITH ANESTHESIA (N/A )  Patient Location: ICU  Anesthesia Type:General  Level of Consciousness: sedated and Patient remains intubated per anesthesia plan  Airway & Oxygen Therapy: Patient remains intubated per anesthesia plan and Patient placed on Ventilator (see vital sign flow sheet for setting)  Post-op Assessment: Report given to RN and Post -op Vital signs reviewed and stable  Post vital signs: Reviewed and stable  Last Vitals:  Vitals Value Taken Time  BP 139/57 05/21/21 1702  Temp    Pulse 65 05/21/21 1710  Resp 18 05/21/21 1710  SpO2 99 % 05/21/21 1710  Vitals shown include unvalidated device data.  Last Pain:  Vitals:   05/21/21 1125  TempSrc: Axillary  PainSc:          Complications: No complications documented.   Report to RN at bedside.  Transported with full monitors and ambu bag 100% 02.  Tolerated transport well. VSS at handoff.

## 2021-05-21 NOTE — Progress Notes (Signed)
NAME:  Ariel Arnold, MRN:  [id number], DOB:  01-01-1956, LOS: 1 ADMISSION DATE:  05/19/2021, CONSULTATION DATE:  6/2 REFERRING MD:  , CHIEF COMPLAINT:  hematemesis   History of Present Illness:  Per chart review, unable to obtain from patient due to clinical status   Ariel Arnold is a 65 yo F who presented to the [facility] ED on 6/1 with complaints of hematemesis  She has a past medical history of recurrent GIB, hepatic encephalopathy, cirrhosis secondary to NASH, hepatic encephalopathy, osteomyelitis with partial amputation of the left foot.   The morning of 6/1 Ariel Arnold woke up nauseous and had one reported episode of emesis with red blood. EMS was called and she was transported to the [facility] ED. It is unclear from documentation if she was having blood in stools. Her ED workup was notable for HGB of 8, PLT 62, INR of 1.7, WBC 2.7. She was started on IV octreotide, Protonix drip, and IV ceftriaxone. GI was consulted and completed a EGD on 6/2 with no ulcer or infiltrating process seen, but concern for gastric variceal bleed. She was intubated for the procedure and remained intubated post procedure. She received a total of 3u PRBC.  A transfer request to [facility] was placed for need for IR consult for treatment of gastric varix (TIPS vs embo).  PCCM was consulted for management post transfer.  Pertinent  Medical History  Recurrent GIB, hepatic encephalopathy, cirrhosis secondary to NASH, hepatic encephalopathy, osteomyelitis with partial amputation of the left foot.   Significant Hospital Events: Including procedures, antibiotic start and stop dates in addition to other pertinent events   . 6/1 Presented to [facility], Ceftriaxone>, 1 UPRBC given . 6/2 Upper EGD-No ulcer or infiltrating process seen. Concern for gastric variceal bleed. TXR to [facility] . 6/3 to IR for potential intervention  Interim History / Subjective:  6/3: type and screen, pt slated for IR for intervention. Can likely extubate after.    Objective   Blood pressure (!) 132/57, pulse 75, temperature 97.6 F (36.4 C), temperature source Axillary, resp. rate 17, height 5\' 8"  (1.727 m), weight 79.4 kg, SpO2 100 %.    Vent Mode: PRVC FiO2 (%):  [40 %] 40 % Set Rate:  [18 bmp] 18 bmp Vt Set:  [510 mL] 510 mL PEEP:  [5 cmH20] 5 cmH20 Plateau Pressure:  [19 cmH20-21 cmH20] 19 cmH20   Intake/Output Summary (Last 24 hours) at 05/21/2021 1632 Last data filed at 05/21/2021 1518 Gross per 24 hour  Intake 2725.76 ml  Output 1150 ml  Net 1575.76 ml   Filed Weights   05/19/21 1232  Weight: 79.4 kg    Examination: General: Adult female, intuabted sedated  Neuro: Sedated, not following commands. HEENT: [place]/AT. Sclerae anicteric. EOMI. Cardiovascular: RRR, no M/R/G.  Lungs: Respirations even and unlabored.  CTA bilaterally, No W/R/R. Abdomen: BS x 4, soft, NT/ND.  Musculoskeletal: Left transmetatarsal amputation with dressing C/D/I.  R great and 3rd toe amputations, no edema.  Skin: Intact, warm, no rashes.   Labs/imaging that I havepersonally reviewed  (right click and "Reselect all SmartList Selections" daily)  CTA abd / pelvis 6/2 > gastro renal shunt with gastric varices in cardia and fundus, patent portal venous system, no active bleeding.  Hepatic cirrhosis with portal hypertension, splenomegaly, small volume ascites, no evidence of HCC, thickening of sigmoid colon and rectum.  Assessment & Plan:   Acute Upper GI Bleed - felt to be due to gastric varices (no active bleeding seen on  EGD). Acute Blood Loss Anemia - 2/2 above. S/p 1u PRBC. -  IR for TIPS vs BRTO today - Continue empiric PPI, octreotide, Cefepime (was already on Cefepime for osteo, see below). - Transfuse PRBC if HBG less than 7.   Liver Cirrhosis secondary to NASH. Hx of Hepatic Encephalopathy. - Supportive care. - No acute interventions required. -coags acceptable  Thrombocytopenia. - Monitor for signs of bleeding.  Left Foot  Osteomyelitis. Hospitalized at [facility] 5/2-5/6, started on IV Vanc and Cefepime with goals for 6 weeks of treatment. - Empiric Vanc and Cefepime as above. - Wound care consult.  Hx DM. - SSI. - Hold home Metformin, Novolog.  Hx HTN, HLD. - Hold home Carvedilol, Furosemide, Pravastatin, Spironolactone.  Best practice (right click and "Reselect all SmartList Selections" daily)  Diet:  NPO Pain/Anxiety/Delirium protocol (if indicated): Yes (RASS goal -1) VAP protocol (if indicated): Yes DVT prophylaxis: Contraindicated GI prophylaxis: PPI Glucose control:  SSI Yes Central venous access:  N/A Arterial line:  N/A Foley:  N/A Mobility:  bed rest  PT consulted: Yes Last date of multidisciplinary goals of care discussion: None yet Code Status:  full code Disposition: ICU  Labs   CBC: Recent Labs  Lab 05/19/21 1525 05/19/21 2302 05/20/21 0600 05/20/21 0905 05/20/21 2104 05/21/21 0640  WBC 2.7* 3.2* 3.8* 5.5 5.8 4.3  NEUTROABS 1.7  --   --   --   --   --   HGB 8.0* 6.7* 6.8* 7.3*  7.3* 8.8* 8.2*  HCT 26.5* 23.3* 22.6* 24.1*  24.4* 27.4* 25.4*  MCV 98.1 103.1* 99.1 99.2 92.6 93.0  PLT 62* 61* 70* 93* 91* 84*    Basic Metabolic Panel: Recent Labs  Lab 05/19/21 1525 05/20/21 0600 05/20/21 2104 05/21/21 0640  NA 138 142 141 142  K 5.1 4.2 3.4* 3.0*  CL 106 111 110 114*  CO2 25 23 23 23   GLUCOSE 319* 213* 127* 79  BUN 32* 44* 49* 48*  CREATININE 0.72 0.75 0.84 0.91  CALCIUM 8.6* 8.5* 8.2* 7.7*  MG  --   --   --  1.4*   GFR: Estimated Creatinine Clearance: 67.3 mL/min (by C-G formula based on SCr of 0.91 mg/dL). Recent Labs  Lab 05/20/21 0600 05/20/21 0905 05/20/21 2104 05/21/21 0640  WBC 3.8* 5.5 5.8 4.3    Liver Function Tests: Recent Labs  Lab 05/19/21 1525 05/20/21 2104 05/21/21 0640  AST 71* 39 39  ALT 41 34 31  ALKPHOS 107 96 73  BILITOT 1.6* 1.6* 1.5*  PROT 5.7* 5.6* 5.2*  ALBUMIN 2.3* 2.4* 2.2*   Recent Labs  Lab 05/19/21 1525  LIPASE 24    Recent Labs  Lab 05/19/21 2302 05/20/21 0905 05/21/21 0640  AMMONIA 94* 53* 41*    ABG No results found for: PHART, PCO2ART, PO2ART, HCO3, TCO2, ACIDBASEDEF, O2SAT   Coagulation Profile: Recent Labs  Lab 05/19/21 1525 05/20/21 0905 05/20/21 2104  INR 1.7* 1.6* 1.6*    Cardiac Enzymes: No results for input(s): CKTOTAL, CKMB, CKMBINDEX, TROPONINI in the last 168 hours.  HbA1C: No results found for: HGBA1C  CBG: Recent Labs  Lab 05/20/21 2101 05/20/21 2329 05/21/21 0334 05/21/21 0732 05/21/21 1123  GLUCAP 110* 118* 78 73 111*     Critical care time: The patient is critically ill with multiple organ systems failure and requires high complexity decision making for assessment and support, frequent evaluation and titration of therapies, application of advanced monitoring technologies and extensive interpretation of multiple databases.  Critical care  time 35 mins. This represents my time independent of the NPs time taking care of the pt. This is excluding procedures.    Briant Sites DO [facility] 05/21/2021, 4:32 PM See Amion for pager If no response to pager, please call [phone] until 1900 After 1900 please call [facility] [phone]

## 2021-05-21 NOTE — Progress Notes (Signed)
Initial Nutrition Assessment  DOCUMENTATION CODES:   Not applicable  INTERVENTION:   If unable to extubate pt after BRTO procedure, recommend initiation of tube feeds via OGT: - Vital AF 1.2 @ 55 ml/hr (1320 ml/day)  Recommended tube feeding regimen provides 1584 kcal, 99 grams of protein, and 1071 ml of H2O.   Recommended tube feeding regimen and current propofol provides 1771 total kcal.  NUTRITION DIAGNOSIS:   Inadequate oral intake related to inability to eat as evidenced by NPO status.  GOAL:   Patient will meet greater than or equal to 90% of their needs  MONITOR:   Vent status,Labs,Weight trends,Skin,I & O's  REASON FOR ASSESSMENT:   Ventilator    ASSESSMENT:   65 year old female who presented to the ED on 6/01 with hematemesis. PMH of cirrhosis 2/2 NASH, hepatic encephalopathy, HTN, thrombocytopenia, T2DM, osteomyelitis of L foot. Pt admitted with acute upper GI bleed.   6/02 - s/p EGD showing stigmata of recent bleeding but no active bleeding 1 small esophageal varix, gastritis, large gastric varix, intubated for procedure, transferred to [facility]  Discussed pt with RN and during ICU rounds. Pt remains intubated after EGD procedure yesterday. Noted plan is for pt to undergo BRTO today with IR.  Pt with OG tube in distal stomach per abdominal x-ray on 6/02. RD will leave tube feeding recommendations.  Patient is currently intubated on ventilator support MV: 9.3 L/min Temp (24hrs), Avg:97.9 F (36.6 C), Min:97.5 F (36.4 C), Max:98.7 F (37.1 C)  Drips: Propofol: 7.1 ml/hr (provides 187 kcal daily from lipid) Fentanyl Octreotide Protonix NS: 75 ml/hr  Medications reviewed and include: colace, SSI q 4 hours, miralax, IV abx, IV KCl 10 mEq x 6 runs  Labs reviewed: potassium 3.0, BUN 48, hemoglobin 8.2 CBG's: 73-151 x 24 hours  I/O's: +5.1 L since admit  NUTRITION - FOCUSED PHYSICAL EXAM:  Flowsheet Row Most Recent Value  Orbital Region No depletion   Upper Arm Region No depletion  Thoracic and Lumbar Region No depletion  Buccal Region Unable to assess  Temple Region No depletion  Clavicle Bone Region Mild depletion  Clavicle and Acromion Bone Region Mild depletion  Scapular Bone Region Unable to assess  Dorsal Hand No depletion  Patellar Region Mild depletion  Anterior Thigh Region No depletion  Posterior Calf Region No depletion  Edema (RD Assessment) None  Hair Reviewed  Eyes Unable to assess  Mouth Unable to assess  Skin Reviewed  Nails Reviewed       Diet Order:   Diet Order            Diet NPO time specified  Diet effective now                 EDUCATION NEEDS:   No education needs have been identified at this time  Skin:  Skin Assessment: Skin Integrity Issues: Stage II: buttocks Other: non-pressure wound to left foot  Last BM:  05/21/21 type 7 via rectal tube  Height:   Ht Readings from Last 1 Encounters:  05/21/21 5\' 8"  (1.727 m)    Weight:   Wt Readings from Last 1 Encounters:  05/19/21 79.4 kg    BMI:  Body mass index is 26.61 kg/m.  Estimated Nutritional Needs:   Kcal:  1600  Protein:  95-115 grams  Fluid:  >/= 1.8 L    07/19/21, MS, RD, LDN Inpatient Clinical Dietitian Please see AMiON for contact information.

## 2021-05-21 NOTE — Procedures (Signed)
  Procedure: Balloon-occluded retrograde transvenous obliteration (BRTO) gastric varices Operators:  Deanne Coffer, Mir EBL:   minimal Complications:  none immediate  See full dictation in [employer].  Thora Lance MD Main # [phone] Pager  [phone] Mobile [phone]

## 2021-05-21 NOTE — Progress Notes (Signed)
Patient arrived back from IR and placed back on full vent support.

## 2021-05-21 NOTE — Anesthesia Postprocedure Evaluation (Signed)
Anesthesia Post Note  Patient: Ariel Arnold  Procedure(s) Performed: IR WITH ANESTHESIA (N/A )     Patient location during evaluation: ICU Anesthesia Type: General Level of consciousness: patient remains intubated per anesthesia plan Pain management: pain level controlled Vital Signs Assessment: post-procedure vital signs reviewed and stable Respiratory status: patient remains intubated per anesthesia plan Cardiovascular status: blood pressure returned to baseline and stable Postop Assessment: no apparent nausea or vomiting Anesthetic complications: no   No complications documented.                 Trevious Rampey A.

## 2021-05-21 NOTE — Anesthesia Procedure Notes (Addendum)
Arterial Line Insertion Start/End6/02/2021 2:00 PM, 05/21/2021 2:07 PM Performed by: Tillman Abide, CRNA, CRNA  Preanesthetic checklist: patient identified, IV checked, site marked, risks and benefits discussed, surgical consent, monitors and equipment checked, pre-op evaluation, timeout performed and anesthesia consent Left, radial was placed Catheter size: 20 G Hand hygiene performed , maximum sterile barriers used  and Seldinger technique used Allen's test indicative of satisfactory collateral circulation Attempts: 2 Procedure performed without using ultrasound guided technique. Ultrasound Notes:anatomy identified Following insertion, Biopatch and dressing applied. Post procedure assessment: normal  Patient tolerated the procedure well with no immediate complications.

## 2021-05-22 ENCOUNTER — Inpatient Hospital Stay (HOSPITAL_COMMUNITY): Payer: Medicare Other

## 2021-05-22 DIAGNOSIS — K920 Hematemesis: Secondary | ICD-10-CM | POA: Diagnosis not present

## 2021-05-22 LAB — BPAM FFP
Blood Product Expiration Date: 202206062359
Blood Product Expiration Date: 202206062359
ISSUE DATE / TIME: 202206031426
ISSUE DATE / TIME: 202206031426
Unit Type and Rh: 6200
Unit Type and Rh: 6200

## 2021-05-22 LAB — VANCOMYCIN, TROUGH: Vancomycin Tr: 22 ug/mL (ref 15–20)

## 2021-05-22 LAB — GLUCOSE, CAPILLARY
Glucose-Capillary: 111 mg/dL — ABNORMAL HIGH (ref 70–99)
Glucose-Capillary: 120 mg/dL — ABNORMAL HIGH (ref 70–99)
Glucose-Capillary: 152 mg/dL — ABNORMAL HIGH (ref 70–99)
Glucose-Capillary: 183 mg/dL — ABNORMAL HIGH (ref 70–99)
Glucose-Capillary: 199 mg/dL — ABNORMAL HIGH (ref 70–99)
Glucose-Capillary: 73 mg/dL (ref 70–99)

## 2021-05-22 LAB — PREPARE FRESH FROZEN PLASMA
Unit division: 0
Unit division: 0

## 2021-05-22 LAB — CBC
HCT: 26.2 % — ABNORMAL LOW (ref 36.0–46.0)
Hemoglobin: 8.5 g/dL — ABNORMAL LOW (ref 12.0–15.0)
MCH: 30.1 pg (ref 26.0–34.0)
MCHC: 32.4 g/dL (ref 30.0–36.0)
MCV: 92.9 fL (ref 80.0–100.0)
Platelets: 67 10*3/uL — ABNORMAL LOW (ref 150–400)
RBC: 2.82 MIL/uL — ABNORMAL LOW (ref 3.87–5.11)
RDW: 22.5 % — ABNORMAL HIGH (ref 11.5–15.5)
WBC: 3 10*3/uL — ABNORMAL LOW (ref 4.0–10.5)
nRBC: 0 % (ref 0.0–0.2)

## 2021-05-22 LAB — AMMONIA: Ammonia: 32 umol/L (ref 9–35)

## 2021-05-22 LAB — COMPREHENSIVE METABOLIC PANEL
ALT: 32 U/L (ref 0–44)
AST: 54 U/L — ABNORMAL HIGH (ref 15–41)
Albumin: 1.9 g/dL — ABNORMAL LOW (ref 3.5–5.0)
Alkaline Phosphatase: 81 U/L (ref 38–126)
Anion gap: 5 (ref 5–15)
BUN: 38 mg/dL — ABNORMAL HIGH (ref 8–23)
CO2: 20 mmol/L — ABNORMAL LOW (ref 22–32)
Calcium: 7.3 mg/dL — ABNORMAL LOW (ref 8.9–10.3)
Chloride: 114 mmol/L — ABNORMAL HIGH (ref 98–111)
Creatinine, Ser: 0.94 mg/dL (ref 0.44–1.00)
GFR, Estimated: >60 mL/min (ref >60)
Glucose, Bld: 121 mg/dL — ABNORMAL HIGH (ref 70–99)
Potassium: 3.5 mmol/L (ref 3.5–5.1)
Sodium: 139 mmol/L (ref 135–145)
Total Bilirubin: 1.5 mg/dL — ABNORMAL HIGH (ref 0.3–1.2)
Total Protein: 4.7 g/dL — ABNORMAL LOW (ref 6.5–8.1)

## 2021-05-22 LAB — VANCOMYCIN, PEAK: Vancomycin Pk: 34 ug/mL (ref 30–40)

## 2021-05-22 LAB — MAGNESIUM: Magnesium: 2.1 mg/dL (ref 1.7–2.4)

## 2021-05-22 IMAGING — CT CT ANGIO ABDOMEN
3 of 16 series · 10 of 46 positions shown, 17 images · IV contrast (omnipaque)
Comparison: [DATE]

CLINICAL DATA: Recurrent GI bleeding, follow-up BRTO

EXAM:
CT ANGIOGRAPHY ABDOMEN
TECHNIQUE: Multidetector CT imaging of the abdomen was performed using the
standard protocol during bolus administration of intravenous
contrast. Multiplanar reconstructed images and MIPs were obtained
and reviewed to evaluate the vascular anatomy.
CONTRAST:  75mL OMNIPAQUE IOHEXOL 350 MG/ML SOLN

[Series 13: venous thins · axial · portal-venous · 0.93mm/px · z∈[+1177,+1431]mm · 8 of 818 slices shown, 13 images]
[im 91/818  soft-tissue]
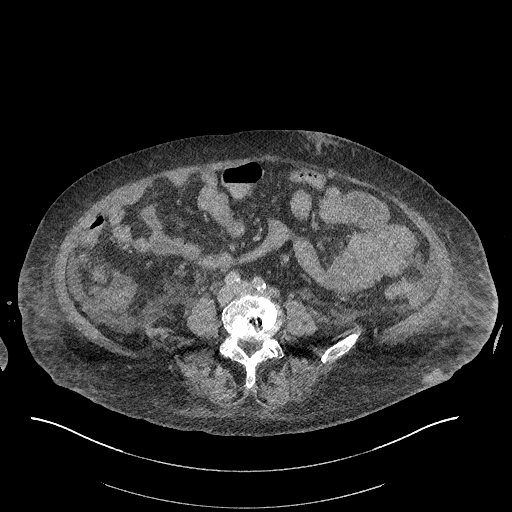
[im 91/818  bone]
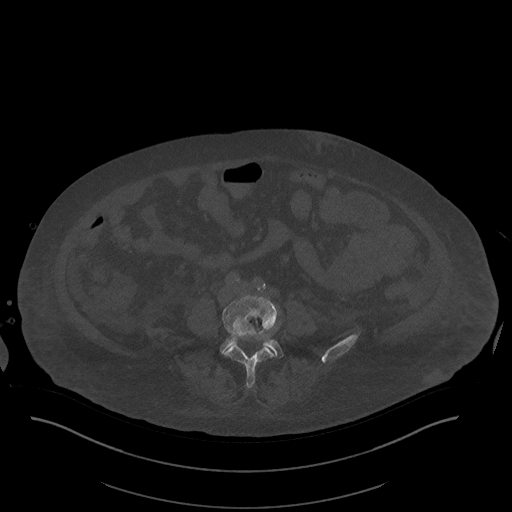
[im 182/818  soft-tissue]
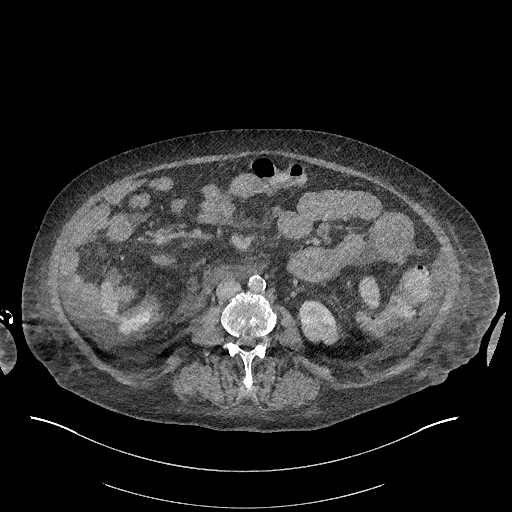
[im 273/818  soft-tissue]
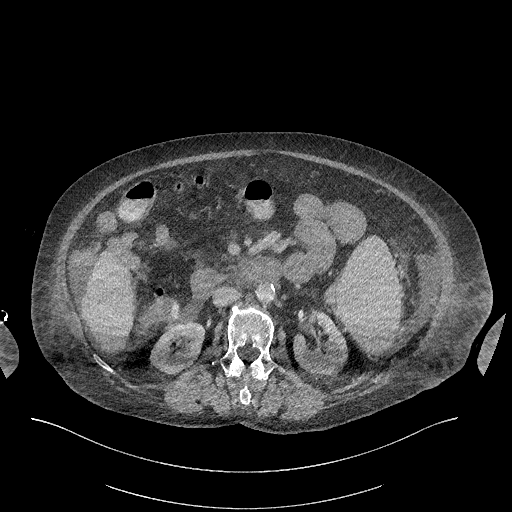
[im 364/818  soft-tissue]
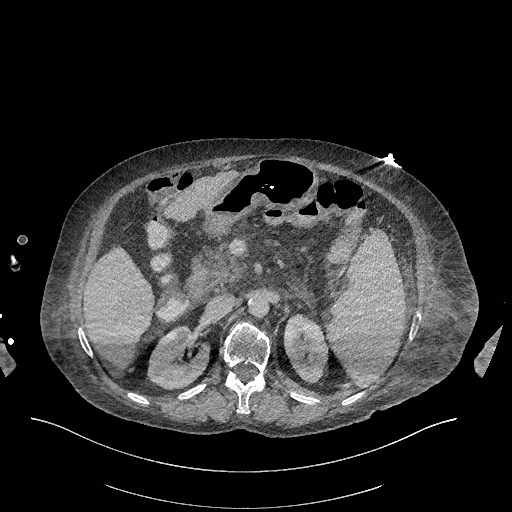
[im 454/818  soft-tissue]
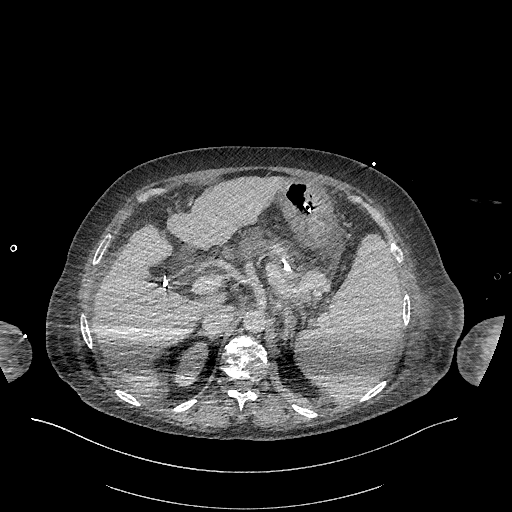
[im 454/818  lung]
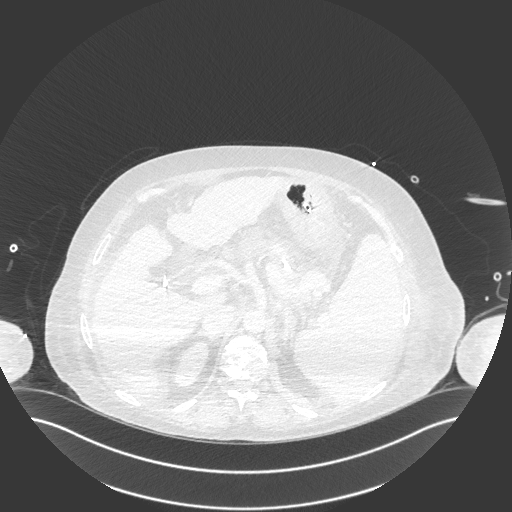
[im 545/818  soft-tissue]
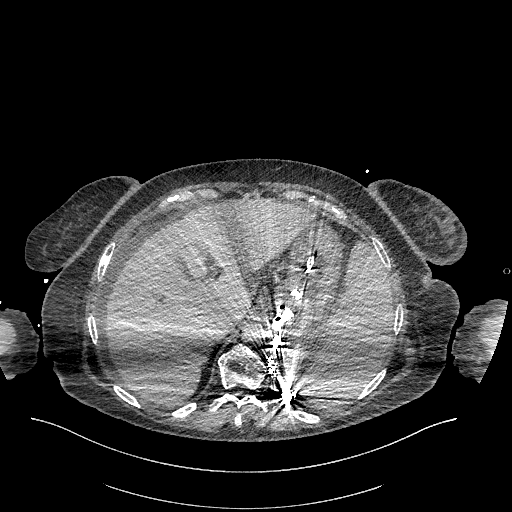
[im 545/818  lung]
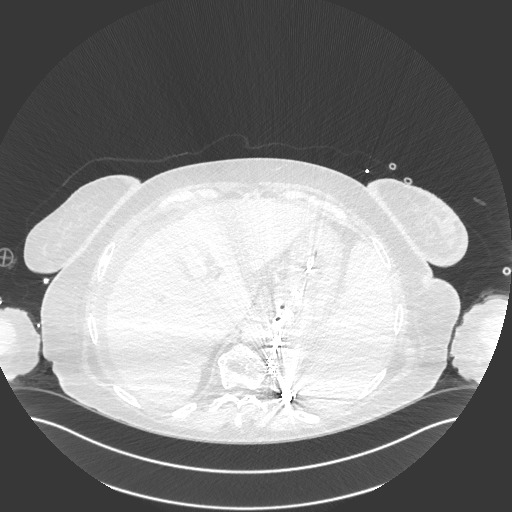
[im 636/818  soft-tissue]
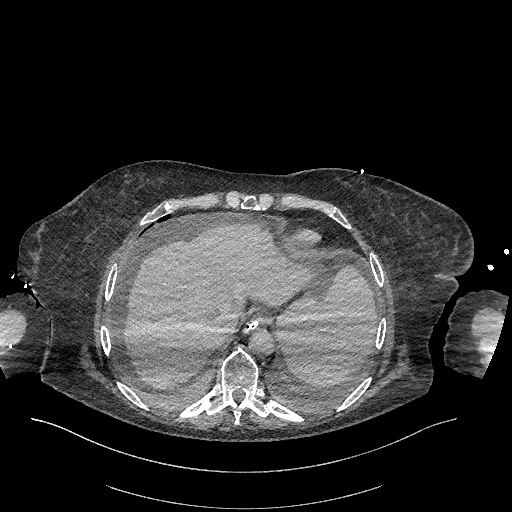
[im 636/818  lung]
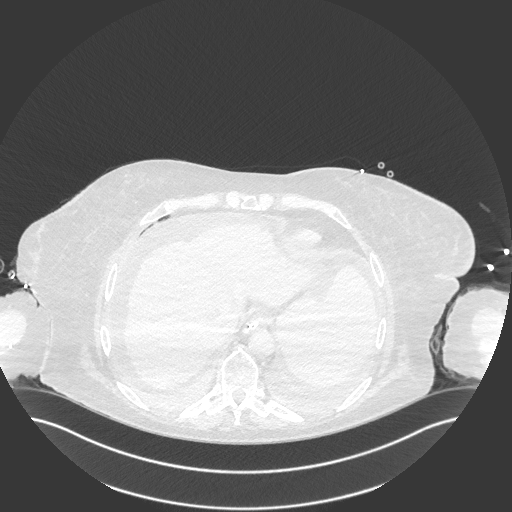
[im 727/818  soft-tissue]
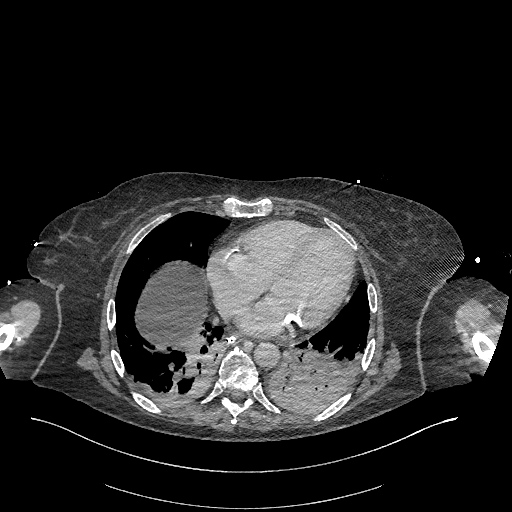
[im 727/818  lung]
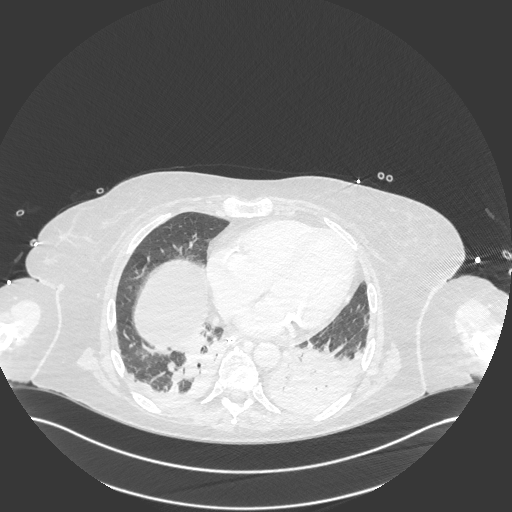

[Series 16: sag · sagittal · 0.58mm/px · 1 of 166 slices shown, 2 images]
[im 29/166  soft-tissue]
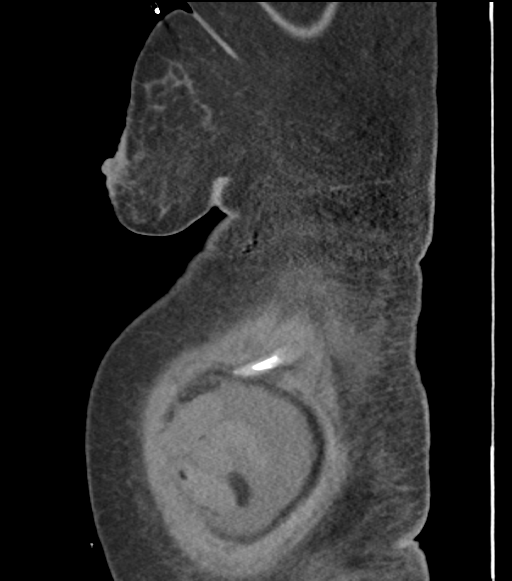
[im 29/166  bone]
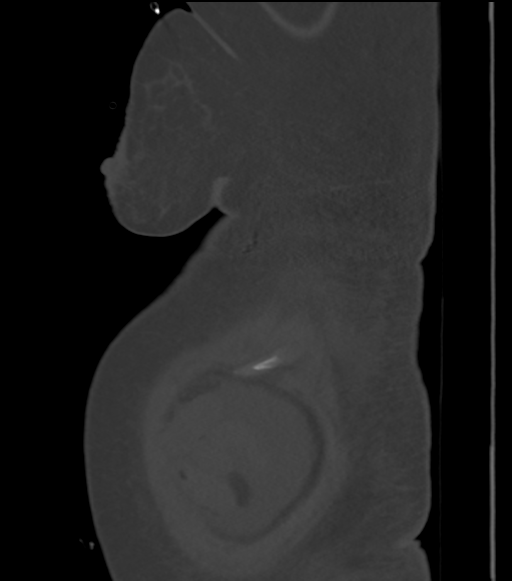

[Series 17: cor · coronal · 0.78mm/px · 1 of 151 slices shown, 2 images]
[im 76/151  soft-tissue]
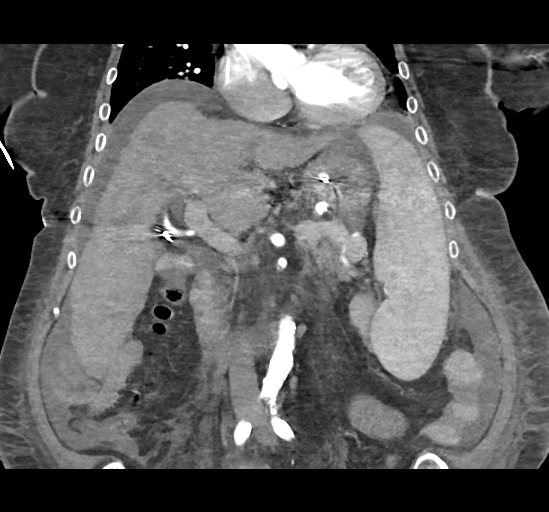
[im 76/151  bone]
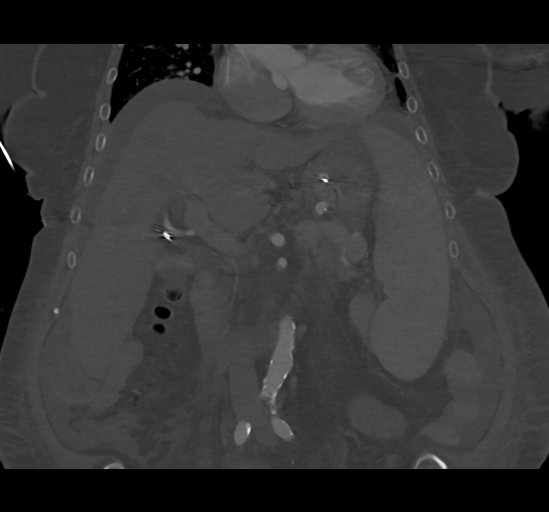

[10 of 46 positions shown; findings below may reference images not displayed]

FINDINGS: VASCULAR

Normal contour and caliber of the abdominal aorta. Standard
branching pattern of the abdominal aorta, with solitary bilateral
renal arteries. Moderate mixed calcific atherosclerosis.

There has been interval coiling and embolization of a left renal
gastric venous shunt and significantly reduced conspicuity of
gastroesophageal varices.

Review of the MIP images confirms the above findings.

NON-VASCULAR

Lower chest: Small bilateral pleural effusions and associated
atelectasis or consolidation, increased compared to prior
examination. Three-vessel coronary artery calcifications and/or
stents.

Hepatobiliary: Cirrhotic morphology of the liver. No focal liver
lesion. Status post cholecystectomy. Postoperative biliary ductal
dilatation.

Pancreas: Adjacent fluid, likely related to ascites. No abnormality
of the pancreatic parenchyma. No pancreatic ductal dilatation.

Spleen: Gross splenomegaly, maximum coronal span 21 cm.

Adrenals/Urinary Tract: Adrenal glands are unremarkable. Kidneys are
normal, without renal calculi, focal lesion, or hydronephrosis.

Stomach/Bowel: Esophagogastric tube with tip and side port below the
diaphragm. Sclerosant present within the gastric fundus mucosa and
adjacent varices. Thickening of the ascending colon, likely
reflecting portal colopathy.

Lymphatic: No enlarged abdominal lymph nodes.

Other: Small volume ascites, similar to prior examination. Mild
anasarca.

Musculoskeletal: No acute or significant osseous findings.
IMPRESSION: 1. There has been interval coiling and sclerosant embolization of a
left renal gastric venous shunt and significantly reduced
conspicuity of gastroesophageal varices.
2. Morphologic stigmata of cirrhosis and portal hypertension with
small volume ascites, similar to prior examination.
3. Small bilateral pleural effusions and associated atelectasis or
consolidation, increased compared to prior examination.
4. Coronary artery disease.

Aortic Atherosclerosis ([WU]-[WU]).

## 2021-05-22 MED ORDER — IOHEXOL 350 MG/ML SOLN
75.0000 mL | Freq: Once | INTRAVENOUS | Status: AC | PRN
  Administered 2021-05-22: 75 mL via INTRAVENOUS

## 2021-05-22 MED ORDER — SODIUM CHLORIDE 0.9% FLUSH
10.0000 mL | Freq: Two times a day (BID) | INTRAVENOUS | Status: DC
  Administered 2021-05-22 – 2021-05-26 (×9): 10 mL

## 2021-05-22 MED ORDER — SODIUM CHLORIDE 0.9% FLUSH: 10.0000 mL | INTRAVENOUS | Status: DC | PRN

## 2021-05-22 MED ORDER — POTASSIUM CHLORIDE 10 MEQ/50ML IV SOLN
10.0000 meq | INTRAVENOUS | Status: AC
  Administered 2021-05-22 (×2): 10 meq via INTRAVENOUS
  Filled 2021-05-22 (×2): qty 50

## 2021-05-22 MED ORDER — VANCOMYCIN HCL 750 MG/150ML IV SOLN
750.0000 mg | Freq: Two times a day (BID) | INTRAVENOUS | Status: DC
  Administered 2021-05-22 – 2021-05-24 (×4): 750 mg via INTRAVENOUS
  Filled 2021-05-22 (×5): qty 150

## 2021-05-22 NOTE — Progress Notes (Signed)
Patient transported on vent to CT and returned to 3M09 without complications.

## 2021-05-22 NOTE — Progress Notes (Signed)
NAME:  Ariel Arnold, MRN:  [id number], DOB:  02-22-1956, LOS: 2 ADMISSION DATE:  05/19/2021, CONSULTATION DATE:  6/2 REFERRING MD:  , CHIEF COMPLAINT:  hematemesis   History of Present Illness:  Per chart review, unable to obtain from patient due to clinical status   Ariel Arnold is a 65 yo F who presented to the [facility] ED on 6/1 with complaints of hematemesis  She has a past medical history of recurrent GIB, hepatic encephalopathy, cirrhosis secondary to NASH, hepatic encephalopathy, osteomyelitis with partial amputation of the left foot.   The morning of 6/1 Ms. Cullifer woke up nauseous and had one reported episode of emesis with red blood. EMS was called and she was transported to the [facility] ED. It is unclear from documentation if she was having blood in stools. Her ED workup was notable for HGB of 8, PLT 62, INR of 1.7, WBC 2.7. She was started on IV octreotide, Protonix drip, and IV ceftriaxone. GI was consulted and completed a EGD on 6/2 with no ulcer or infiltrating process seen, but concern for gastric variceal bleed. She was intubated for the procedure and remained intubated post procedure. She received a total of 3u PRBC.  A transfer request to [facility] was placed for need for IR consult for treatment of gastric varix (TIPS vs embo).  PCCM was consulted for management post transfer.  Pertinent  Medical History  Recurrent GIB, hepatic encephalopathy, cirrhosis secondary to NASH, hepatic encephalopathy, osteomyelitis with partial amputation of the left foot.   Significant Hospital Events: Including procedures, antibiotic start and stop dates in addition to other pertinent events   . 6/1 Presented to [facility], Ceftriaxone>, 1 UPRBC given . 6/2 Upper EGD-No ulcer or infiltrating process seen. Concern for gastric variceal bleed. TXR to [facility] . 6/3 to IR for BRTO of gastric varices,hemoglobin stable  Interim History / Subjective:  6/4: place on sbt. hgb stable. Reportedly successfuly BRTO. Still drowsy  on exam and not following commands. cta planned per IR today will certainly hold extubation attemtps for that image 6/3: type and screen, pt slated for IR for intervention. Can likely extubate after.   Objective   Blood pressure (!) 128/58, pulse 63, temperature (!) 97.3 F (36.3 C), temperature source Axillary, resp. rate 18, height 5\' 8"  (1.727 m), weight 79.4 kg, SpO2 100 %.    Vent Mode: PRVC FiO2 (%):  [40 %] 40 % Set Rate:  [18 bmp] 18 bmp Vt Set:  [510 mL] 510 mL PEEP:  [5 cmH20] 5 cmH20 Plateau Pressure:  [20 cmH20-22 cmH20] 22 cmH20   Intake/Output Summary (Last 24 hours) at 05/22/2021 0840 Last data filed at 05/22/2021 0701 Gross per 24 hour  Intake 4364.34 ml  Output 1975 ml  Net 2389.34 ml   Filed Weights   05/19/21 1232  Weight: 79.4 kg    Examination: General: Adult female, intuabted lightly sedated  Neuro: Sedated, not following commands. HEENT: [place]/AT. Sclerae anicteric. EOMI. Cardiovascular: RRR, no M/R/G.  Lungs: Respirations even and unlabored.  CTA bilaterally, No W/R/R. Abdomen: BS x 4, soft, NT/ND.  Musculoskeletal: Left transmetatarsal amputation with dressing C/D/I.  R great and 3rd toe amputations, no edema.  Skin: Intact, warm, no rashes.   Labs/imaging that I havepersonally reviewed  (right click and "Reselect all SmartList Selections" daily)  CTA abd / pelvis 6/2 > gastro renal shunt with gastric varices in cardia and fundus, patent portal venous system, no active bleeding.  Hepatic cirrhosis with portal hypertension, splenomegaly, small  volume ascites, no evidence of HCC, thickening of sigmoid colon and rectum.  Assessment & Plan:   Acute Upper GI Bleed - felt to be due to gastric varices (no active bleeding seen on EGD). Acute Blood Loss Anemia - 2/2 above. S/p 1u PRBC. -  S/p BRTO, hgb stable at this time.  - Continue empiric PPI, octreotide (although feel that we will be able to stop this after cta completed now that she is s/p intervention)  Cefepime (was already on Cefepime for osteo, see below). - Transfuse PRBC if HBG less than 7.   Liver Cirrhosis secondary to NASH. Hx of Hepatic Encephalopathy. - Supportive care. - No acute interventions required. -coags acceptable  Thrombocytopenia. - Monitor for signs of bleeding.  Left Foot Osteomyelitis. Hospitalized at [facility] 5/2-5/6, started on IV Vanc and Cefepime with goals for 6 weeks of treatment. - Empiric Vanc and Cefepime as above. - Wound care consult.  Hx DM. - SSI. - Hold home Metformin, Novolog.  Hx HTN, HLD. - Hold home Carvedilol, Furosemide, Pravastatin, Spironolactone.  Best practice (right click and "Reselect all SmartList Selections" daily)  Diet:  NPO Pain/Anxiety/Delirium protocol (if indicated): Yes (RASS goal -1) VAP protocol (if indicated): Yes DVT prophylaxis: Contraindicated GI prophylaxis: PPI Glucose control:  SSI Yes Central venous access:  N/A Arterial line:  N/A Foley:  N/A Mobility:  bed rest  PT consulted: Yes Last date of multidisciplinary goals of care discussion: None yet Code Status:  full code Disposition: ICU  Labs   CBC: Recent Labs  Lab 05/19/21 1525 05/19/21 2302 05/20/21 0600 05/20/21 0905 05/20/21 2104 05/21/21 0640 05/21/21 1429 05/22/21 0500  WBC 2.7*   < > 3.8* 5.5 5.8 4.3  --  3.0*  NEUTROABS 1.7  --   --   --   --   --   --   --   HGB 8.0*   < > 6.8* 7.3*  7.3* 8.8* 8.2* 7.1* 8.5*  HCT 26.5*   < > 22.6* 24.1*  24.4* 27.4* 25.4* 21.0* 26.2*  MCV 98.1   < > 99.1 99.2 92.6 93.0  --  92.9  PLT 62*   < > 70* 93* 91* 84*  --  67*   < > = values in this interval not displayed.    Basic Metabolic Panel: Recent Labs  Lab 05/19/21 1525 05/20/21 0600 05/20/21 2104 05/21/21 0640 05/21/21 1429 05/22/21 0500  NA 138 142 141 142 146* 139  K 5.1 4.2 3.4* 3.0* 3.3* 3.5  CL 106 111 110 114*  --  114*  CO2 25 23 23 23   --  20*  GLUCOSE 319* 213* 127* 79  --  121*  BUN 32* 44* 49* 48*  --  38*  CREATININE  0.72 0.75 0.84 0.91  --  0.94  CALCIUM 8.6* 8.5* 8.2* 7.7*  --  7.3*  MG  --   --   --  1.4*  --  2.1   GFR: Estimated Creatinine Clearance: 65.1 mL/min (by C-G formula based on SCr of 0.94 mg/dL). Recent Labs  Lab 05/20/21 0905 05/20/21 2104 05/21/21 0640 05/22/21 0500  WBC 5.5 5.8 4.3 3.0*    Liver Function Tests: Recent Labs  Lab 05/19/21 1525 05/20/21 2104 05/21/21 0640 05/22/21 0500  AST 71* 39 39 54*  ALT 41 34 31 32  ALKPHOS 107 96 73 81  BILITOT 1.6* 1.6* 1.5* 1.5*  PROT 5.7* 5.6* 5.2* 4.7*  ALBUMIN 2.3* 2.4* 2.2* 1.9*   Recent Labs  Lab 05/19/21 1525  LIPASE 24   Recent Labs  Lab 05/19/21 2302 05/20/21 0905 05/21/21 0640 05/22/21 0500  AMMONIA 94* 53* 41* 32    ABG    Component Value Date/Time   PHART 7.337 (L) 05/21/2021 1429   PCO2ART 41.1 05/21/2021 1429   PO2ART 182 (H) 05/21/2021 1429   HCO3 22.0 05/21/2021 1429   TCO2 23 05/21/2021 1429   ACIDBASEDEF 4.0 (H) 05/21/2021 1429   O2SAT 100.0 05/21/2021 1429     Coagulation Profile: Recent Labs  Lab 05/19/21 1525 05/20/21 0905 05/20/21 2104  INR 1.7* 1.6* 1.6*    Cardiac Enzymes: No results for input(s): CKTOTAL, CKMB, CKMBINDEX, TROPONINI in the last 168 hours.  HbA1C: No results found for: HGBA1C  CBG: Recent Labs  Lab 05/21/21 1123 05/21/21 1948 05/21/21 2329 05/22/21 0327 05/22/21 0737  GLUCAP 111* 99 120* 120* 111*     Critical care time: The patient is critically ill with multiple organ systems failure and requires high complexity decision making for assessment and support, frequent evaluation and titration of therapies, application of advanced monitoring technologies and extensive interpretation of multiple databases.  Critical care time 35 mins. This represents my time independent of the NPs time taking care of the pt. This is excluding procedures.    Briant Sites DO [facility] 05/22/2021, 8:40 AM See Amion for pager If no response  to pager, please call [phone] until 1900 After 1900 please call [facility] [phone]

## 2021-05-22 NOTE — Progress Notes (Signed)
Referring Physician(s): Kendell Bane [facility])  Supervising Physician: Irish Lack  Patient Status:  [facility] - In-pt  Chief Complaint:  History of NASH cirrhosis with associated gastric varices complicated by recurrent GI bleeding s/p BRTO of gastric varices via right femoral approach 05/21/2021 by Dr. Deanne Coffer.  Subjective:  Patient laying in bed intubated with sedation. She briefly opens eyes to voice. Does not follow simple commands. Right femoral puncture site c/d/i.   Allergies: Patient has no known allergies.  Medications: Prior to Admission medications   Medication Sig Start Date End Date Taking? Authorizing Provider  albuterol (VENTOLIN HFA) 108 (90 Base) MCG/ACT inhaler Inhale 2 puffs into the lungs every 4 (four) hours as needed. 01/11/21   [provider]  benzonatate (TESSALON) 100 MG capsule Take 100 mg by mouth 3 (three) times daily as needed. 02/05/21   [provider]  carvedilol (COREG) 3.125 MG tablet Take 3.125 mg by mouth 2 (two) times daily. 02/20/21   [provider]  clotrimazole-betamethasone (LOTRISONE) cream Apply topically 2 (two) times daily. 02/20/21   [provider]  furosemide (LASIX) 20 MG tablet Take 1 tablet by mouth daily. 03/21/21   [provider]  gabapentin (NEURONTIN) 800 MG tablet Take 800 mg by mouth 3 (three) times daily. 05/11/21   [provider]  hydrOXYzine (ATARAX/VISTARIL) 25 MG tablet Take 25 mg by mouth 3 (three) times daily. 02/20/21   [provider]  insulin glargine, 2 Unit Dial, (TOUJEO MAX SOLOSTAR) 300 UNIT/ML Solostar Pen 60 Units daily. 06/26/20   [provider]  lactulose (CHRONULAC) 10 GM/15ML solution Take 20 g by mouth 2 (two) times daily. 03/21/21   [provider]  metFORMIN (GLUCOPHAGE) 1000 MG tablet Take 1 tablet by mouth 2 (two) times daily. 05/10/21   [provider]  NOVOLOG FLEXPEN 100 UNIT/ML FlexPen Inject 4 Units into the skin in  the morning, at noon, and at bedtime. 03/21/21   [provider]  pantoprazole (PROTONIX) 40 MG tablet Take 1 tablet by mouth 2 (two) times daily. 03/21/21   [provider]  pravastatin (PRAVACHOL) 20 MG tablet Take 20 mg by mouth at bedtime. 12/14/20   [provider]  sertraline (ZOLOFT) 100 MG tablet Take 1.5 tablets by mouth daily. 03/09/21   [provider]  spironolactone (ALDACTONE) 50 MG tablet Take 1 tablet by mouth daily. 03/21/21   [provider]  vancomycin (VANCOCIN) 10 G SOLR injection Inject 1 g into the vein in the morning and at bedtime. 05/13/21   [provider]  Vitamin D, Ergocalciferol, (DRISDOL) 1.25 MG (50000 UNIT) CAPS capsule Take 1 capsule by mouth once a week. 04/05/21   [provider]     Vital Signs: BP (!) 142/65   Pulse 64   Temp (!) 97.3 F (36.3 C) (Axillary)   Resp 18   Ht  (1.727 m)   Wt 175 lb (79.4 kg)   SpO2 100%   BMI 26.61 kg/m   Physical Exam Vitals and nursing note reviewed.  Constitutional:      General: She is not in acute distress.    Comments: Intubated/sedated.  Pulmonary:     Effort: Pulmonary effort is normal. No respiratory distress.     Comments: Intubated/sedated. Skin:    General: Skin is warm and dry.     Comments: Right femoral puncture site soft without active bleeding or hematoma.  Neurological:     Comments: Intubated/sedated. Briefly opens eyes to voice.  Does not follow simple commands. Right DP dopplerable.     Imaging: DG Ribs Unilateral Right  Result Date: 05/19/2021 CLINICAL DATA:  Right-sided rib pain following fall, initial encounter EXAM: RIGHT RIBS - 2 VIEW COMPARISON:  None. FINDINGS: Mild irregularity of the anterior aspect of the right fifth through seventh ribs is noted consistent with acute fracture. No pneumothorax is seen. No other focal abnormality is noted. IMPRESSION: Right-sided rib fractures as described without complicating factors.  Electronically Signed   By: Alcide Clever M.D.   On: 05/19/2021 17:59   DG Ribs Unilateral W/Chest Left  Result Date: 05/19/2021 CLINICAL DATA:  Recent fall with bilateral rib pain, initial encounter EXAM: LEFT RIBS AND CHEST - 3+ VIEW COMPARISON:  None. FINDINGS: Minimally displaced left fifth rib fracture is noted anteriorly. No other fractures are seen. No pneumothorax is noted. Right-sided PICC line is noted at the junction of the innominate veins. IMPRESSION: Left fifth rib fracture without complicating factors. Electronically Signed   By: Alcide Clever M.D.   On: 05/19/2021 17:58   CT Head Wo Contrast  Result Date: 05/19/2021 CLINICAL DATA:  Head trauma EXAM: CT HEAD WITHOUT CONTRAST TECHNIQUE: Contiguous axial images were obtained from the base of the skull through the vertex without intravenous contrast. COMPARISON:  04/01/2021 FINDINGS: Brain: Mild age related volume loss. No acute intracranial abnormality. Specifically, no hemorrhage, hydrocephalus, mass lesion, acute infarction, or significant intracranial injury. Vascular: No hyperdense vessel or unexpected calcification. Skull: No acute calvarial abnormality. Sinuses/Orbits: No acute findings Other: None IMPRESSION: No acute intracranial abnormality. Electronically Signed   By: Charlett Nose M.D.   On: 05/19/2021 17:23   IR Angiogram Selective Each Additional Vessel  Result Date: 05/21/2021 CLINICAL DATA:  Mesh cirrhosis, thrombocytopenia, hepatic encephalopathy, recurrent GI bleeding, 2 episodes of hematemesis and melena, CTA confirms patent gastro renal shunt and gastric varices EXAM: BALLOON-OCCLUDED RETROGRADE TRANSVENOUS OBLITERATION (BRTO) GASTRIC VARICES ULTRASOUND GUIDANCE FOR VASCULAR ACCESS TRANSVENOUS OBLITERATION OF GASTRIC VARICES WITH SCLEROTHERAPY COIL EMBOLIZATION OF GASTRORENAL SHUNT LEFT RENAL VEIN VENOGRAPHY GASTRORENAL SHUNT AND GASTRIC VARICES VENOGRAPHY MEDICATIONS: MEDICATIONS None. ANESTHESIA/SEDATION: Patient was under  general anesthetic COMPLICATIONS: None immediate. PROCEDURE: Informed consent was obtained from the patient's family following explanation of the procedure, risks, benefits and alternatives. Consent was obtained for BRTO and/or TIPS procedure. The family understands, agrees and consents for the procedure. All questions were addressed. A time out was performed prior to the initiation of the procedure. Maximal barrier sterile technique utilized including caps, mask, sterile gowns, sterile gloves, large sterile drape, hand hygiene, and Betadine prep. Operators: Deanne Coffer, [facility] confirmed a patent right common femoral vein. Right groin was prepped and draped in a sterile fashion. Skin was anesthetized with 1% lidocaine. 21 gauge needle directed into the right common femoral vein with ultrasound guidance and a micropuncture dilator set was placed. This access was upsized to a 10 [place] sheath. A 5 [place] Kumpe catheter was used to cannulate the left renal vein. Left renal venography was performed. A Rosen wire was advanced into the left renal vein and the sheath was advanced into the left renal vein.  A 5 French angled Glide catheter was used to cannulate the gastrorenal shunt. This catheter was advanced further into the gastrorenal shunt an additional venography was performed. A stiff Amplatz wire was advanced into the gastrorenal shunt. The sheath was advanced over the 5 French catheter and the Amplatz wire. At this time, the 5 French catheter was exchanged for 65 cm [employer] occlusion balloon. The  balloon was inflated and retrograde venography was performed of the gastrorenal shunt. A high-flow Renegade catheter was advanced into the gastrorenal shunt towards the gastric varices. Multiple contrast injections were performed in order to completely opacify the shunt, varices and afferent portal flow. However, the entire pathway could not be opacified with iodinated contrast. Therefore, the gsatrorenal  shunt and varices were opacified with a carbon dioxide DSA angiography. The afferent limb of the shunt and portal venous system was successfully identified with the carbon dioxide venogram. Foam sclerosant was created utilizing 2 mL of Lipiodol, 4 mL of Sotradecol 3% and 6 mL of Air.  The foam sclerotherapy was performed under fluoroscopic guidance. Sclerosant agent was injected through the high-flow Renegade catheter. Sclerosant infusion was stopped when there was filling of the gastric varices and back flow towards the occlusion Balloon.  The high-flow Renegade catheter was flushed and partially withdrawn. The gastrorenal shunt was then coil embolized with interlock coils. The radiate catheter was then exchanged for a Lantern micro catheter and additional embolization performed using Ruby coils. The occlusion balloon was gently deflated. The microcatheter, catheter, and sheath were then gently withdrawn and removed. Hemostasis was achieved at the site. FINDINGS: Large gastrorenal shunt. Gastric varices are supplied by the posterior gastric vein. Portal venous system is patent. Sclerosant was successfully placed occluding the gastric varices near the gastric fundus. IMPRESSION: 1. Successful balloon occlusion retrograde transvenous obliteration (BRTO) of the gastric varices. Electronically Signed   By: Corlis Leak  Hassell M.D.   On: 05/21/2021 18:04   IR Venogram Renal Uni Left  Result Date: 05/21/2021 CLINICAL DATA:  Mesh cirrhosis, thrombocytopenia, hepatic encephalopathy, recurrent GI bleeding, 2 episodes of hematemesis and melena, CTA confirms patent gastro renal shunt and gastric varices EXAM: BALLOON-OCCLUDED RETROGRADE TRANSVENOUS OBLITERATION (BRTO) GASTRIC VARICES ULTRASOUND GUIDANCE FOR VASCULAR ACCESS TRANSVENOUS OBLITERATION OF GASTRIC VARICES WITH SCLEROTHERAPY COIL EMBOLIZATION OF GASTRORENAL SHUNT LEFT RENAL VEIN VENOGRAPHY GASTRORENAL SHUNT AND GASTRIC VARICES VENOGRAPHY MEDICATIONS: MEDICATIONS None.  ANESTHESIA/SEDATION: Patient was under general anesthetic COMPLICATIONS: None immediate. PROCEDURE: Informed consent was obtained from the patient's family following explanation of the procedure, risks, benefits and alternatives. Consent was obtained for BRTO and/or TIPS procedure. The family understands, agrees and consents for the procedure. All questions were addressed. A time out was performed prior to the initiation of the procedure. Maximal barrier sterile technique utilized including caps, mask, sterile gowns, sterile gloves, large sterile drape, hand hygiene, and Betadine prep. Operators: Deanne CofferHassell, [facility] confirmed a patent right common femoral vein. Right groin was prepped and draped in a sterile fashion. Skin was anesthetized with 1% lidocaine. 21 gauge needle directed into the right common femoral vein with ultrasound guidance and a micropuncture dilator set was placed. This access was upsized to a 10 [place] sheath. A 5 [place] Kumpe catheter was used to cannulate the left renal vein. Left renal venography was performed. A Rosen wire was advanced into the left renal vein and the sheath was advanced into the left renal vein.  A 5 French angled Glide catheter was used to cannulate the gastrorenal shunt. This catheter was advanced further into the gastrorenal shunt an additional venography was performed. A stiff Amplatz wire was advanced into the gastrorenal shunt. The sheath was advanced over the 5 French catheter and the Amplatz wire. At this time, the 5 French catheter was exchanged for 65 cm [employer] occlusion balloon. The balloon was inflated and retrograde venography was performed of the gastrorenal shunt. A high-flow Renegade catheter was advanced into the gastrorenal  shunt towards the gastric varices. Multiple contrast injections were performed in order to completely opacify the shunt, varices and afferent portal flow. However, the entire pathway could not be opacified with iodinated  contrast. Therefore, the gsatrorenal shunt and varices were opacified with a carbon dioxide DSA angiography. The afferent limb of the shunt and portal venous system was successfully identified with the carbon dioxide venogram. Foam sclerosant was created utilizing 2 mL of Lipiodol, 4 mL of Sotradecol 3% and 6 mL of Air.  The foam sclerotherapy was performed under fluoroscopic guidance. Sclerosant agent was injected through the high-flow Renegade catheter. Sclerosant infusion was stopped when there was filling of the gastric varices and back flow towards the occlusion Balloon.  The high-flow Renegade catheter was flushed and partially withdrawn. The gastrorenal shunt was then coil embolized with interlock coils. The radiate catheter was then exchanged for a Lantern micro catheter and additional embolization performed using Ruby coils. The occlusion balloon was gently deflated. The microcatheter, catheter, and sheath were then gently withdrawn and removed. Hemostasis was achieved at the site. FINDINGS: Large gastrorenal shunt. Gastric varices are supplied by the posterior gastric vein. Portal venous system is patent. Sclerosant was successfully placed occluding the gastric varices near the gastric fundus. IMPRESSION: 1. Successful balloon occlusion retrograde transvenous obliteration (BRTO) of the gastric varices. Electronically Signed   By: Corlis Leak M.D.   On: 05/21/2021 18:04   IR US Guide Vasc Access Right  Result Date: 05/21/2021 CLINICAL DATA:  Mesh cirrhosis, thrombocytopenia, hepatic encephalopathy, recurrent GI bleeding, 2 episodes of hematemesis and melena, CTA confirms patent gastro renal shunt and gastric varices EXAM: BALLOON-OCCLUDED RETROGRADE TRANSVENOUS OBLITERATION (BRTO) GASTRIC VARICES ULTRASOUND GUIDANCE FOR VASCULAR ACCESS TRANSVENOUS OBLITERATION OF GASTRIC VARICES WITH SCLEROTHERAPY COIL EMBOLIZATION OF GASTRORENAL SHUNT LEFT RENAL VEIN VENOGRAPHY GASTRORENAL SHUNT AND GASTRIC VARICES  VENOGRAPHY MEDICATIONS: MEDICATIONS None. ANESTHESIA/SEDATION: Patient was under general anesthetic COMPLICATIONS: None immediate. PROCEDURE: Informed consent was obtained from the patient's family following explanation of the procedure, risks, benefits and alternatives. Consent was obtained for BRTO and/or TIPS procedure. The family understands, agrees and consents for the procedure. All questions were addressed. A time out was performed prior to the initiation of the procedure. Maximal barrier sterile technique utilized including caps, mask, sterile gowns, sterile gloves, large sterile drape, hand hygiene, and Betadine prep. Operators: Deanne Coffer, [facility] confirmed a patent right common femoral vein. Right groin was prepped and draped in a sterile fashion. Skin was anesthetized with 1% lidocaine. 21 gauge needle directed into the right common femoral vein with ultrasound guidance and a micropuncture dilator set was placed. This access was upsized to a 10 [place] sheath. A 5 [place] Kumpe catheter was used to cannulate the left renal vein. Left renal venography was performed. A Rosen wire was advanced into the left renal vein and the sheath was advanced into the left renal vein.  A 5 French angled Glide catheter was used to cannulate the gastrorenal shunt. This catheter was advanced further into the gastrorenal shunt an additional venography was performed. A stiff Amplatz wire was advanced into the gastrorenal shunt. The sheath was advanced over the 5 French catheter and the Amplatz wire. At this time, the 5 French catheter was exchanged for 65 cm [employer] occlusion balloon. The balloon was inflated and retrograde venography was performed of the gastrorenal shunt. A high-flow Renegade catheter was advanced into the gastrorenal shunt towards the gastric varices. Multiple contrast injections were performed in order to completely opacify the shunt, varices and afferent  portal flow. However, the entire  pathway could not be opacified with iodinated contrast. Therefore, the gsatrorenal shunt and varices were opacified with a carbon dioxide DSA angiography. The afferent limb of the shunt and portal venous system was successfully identified with the carbon dioxide venogram. Foam sclerosant was created utilizing 2 mL of Lipiodol, 4 mL of Sotradecol 3% and 6 mL of Air.  The foam sclerotherapy was performed under fluoroscopic guidance. Sclerosant agent was injected through the high-flow Renegade catheter. Sclerosant infusion was stopped when there was filling of the gastric varices and back flow towards the occlusion Balloon.  The high-flow Renegade catheter was flushed and partially withdrawn. The gastrorenal shunt was then coil embolized with interlock coils. The radiate catheter was then exchanged for a Lantern micro catheter and additional embolization performed using Ruby coils. The occlusion balloon was gently deflated. The microcatheter, catheter, and sheath were then gently withdrawn and removed. Hemostasis was achieved at the site. FINDINGS: Large gastrorenal shunt. Gastric varices are supplied by the posterior gastric vein. Portal venous system is patent. Sclerosant was successfully placed occluding the gastric varices near the gastric fundus. IMPRESSION: 1. Successful balloon occlusion retrograde transvenous obliteration (BRTO) of the gastric varices. Electronically Signed   By: Corlis Leak M.D.   On: 05/21/2021 18:04   DG Chest Port 1 View  Result Date: 05/20/2021 CLINICAL DATA:  Central line placement. EXAM: PORTABLE CHEST 1 VIEW COMPARISON:  Rib radiographs earlier today. FINDINGS: New left internal jugular central line tip at the atrial caval junction. No pneumothorax. Right upper extremity central line tip extends to the subclavian vein. Endotracheal tube is approximately 2.4 cm from the carina. Upper normal heart size. Aortic atherosclerosis. Patchy retrocardiac opacity. No significant pleural  effusion. Right rib fractures are better seen on recent rib radiographs. IMPRESSION: 1. New left internal jugular central line with tip at the atrial caval junction. No pneumothorax. 2. Right upper extremity central line with tip in the subclavian vein. Endotracheal tube 2.4 cm from the carina. 3. Patchy retrocardiac opacity, favoring atelectasis. Electronically Signed   By: Narda Rutherford M.D.   On: 05/20/2021 18:37   DG Knee Complete 4 Views Right  Result Date: 05/19/2021 CLINICAL DATA:  Recent fall with right knee pain, initial encounter EXAM: RIGHT KNEE - COMPLETE 4+ VIEW COMPARISON:  None. FINDINGS: Meniscal calcification is identified. No fracture or dislocation is seen. No joint effusion is noted. Mild soft tissue swelling is noted anteriorly consistent with the recent injury. IMPRESSION: Soft tissue swelling without acute bony abnormality. Electronically Signed   By: Alcide Clever M.D.   On: 05/19/2021 17:56   DG Abd Portable 1V  Result Date: 05/20/2021 CLINICAL DATA:  Check gastric catheter placement EXAM: PORTABLE ABDOMEN - 1 VIEW COMPARISON:  None. FINDINGS: Gastric catheter is noted in the distal stomach. Nonobstructive bowel gas pattern is seen. IMPRESSION: Gastric catheter in the distal stomach. Electronically Signed   By: Alcide Clever M.D.   On: 05/20/2021 22:27   IR EMBO VENOUS NOT HEMORR HEMANG  INC GUIDE ROADMAPPING  Result Date: 05/21/2021 CLINICAL DATA:  Mesh cirrhosis, thrombocytopenia, hepatic encephalopathy, recurrent GI bleeding, 2 episodes of hematemesis and melena, CTA confirms patent gastro renal shunt and gastric varices EXAM: BALLOON-OCCLUDED RETROGRADE TRANSVENOUS OBLITERATION (BRTO) GASTRIC VARICES ULTRASOUND GUIDANCE FOR VASCULAR ACCESS TRANSVENOUS OBLITERATION OF GASTRIC VARICES WITH SCLEROTHERAPY COIL EMBOLIZATION OF GASTRORENAL SHUNT LEFT RENAL VEIN VENOGRAPHY GASTRORENAL SHUNT AND GASTRIC VARICES VENOGRAPHY MEDICATIONS: MEDICATIONS None. ANESTHESIA/SEDATION: Patient was  under general anesthetic COMPLICATIONS: None immediate. PROCEDURE: Informed  consent was obtained from the patient's family following explanation of the procedure, risks, benefits and alternatives. Consent was obtained for BRTO and/or TIPS procedure. The family understands, agrees and consents for the procedure. All questions were addressed. A time out was performed prior to the initiation of the procedure. Maximal barrier sterile technique utilized including caps, mask, sterile gowns, sterile gloves, large sterile drape, hand hygiene, and Betadine prep. Operators: Deanne Coffer, [facility] confirmed a patent right common femoral vein. Right groin was prepped and draped in a sterile fashion. Skin was anesthetized with 1% lidocaine. 21 gauge needle directed into the right common femoral vein with ultrasound guidance and a micropuncture dilator set was placed. This access was upsized to a 10 [place] sheath. A 5 [place] Kumpe catheter was used to cannulate the left renal vein. Left renal venography was performed. A Rosen wire was advanced into the left renal vein and the sheath was advanced into the left renal vein.  A 5 French angled Glide catheter was used to cannulate the gastrorenal shunt. This catheter was advanced further into the gastrorenal shunt an additional venography was performed. A stiff Amplatz wire was advanced into the gastrorenal shunt. The sheath was advanced over the 5 French catheter and the Amplatz wire. At this time, the 5 French catheter was exchanged for 65 cm [employer] occlusion balloon. The balloon was inflated and retrograde venography was performed of the gastrorenal shunt. A high-flow Renegade catheter was advanced into the gastrorenal shunt towards the gastric varices. Multiple contrast injections were performed in order to completely opacify the shunt, varices and afferent portal flow. However, the entire pathway could not be opacified with iodinated contrast. Therefore, the  gsatrorenal shunt and varices were opacified with a carbon dioxide DSA angiography. The afferent limb of the shunt and portal venous system was successfully identified with the carbon dioxide venogram. Foam sclerosant was created utilizing 2 mL of Lipiodol, 4 mL of Sotradecol 3% and 6 mL of Air.  The foam sclerotherapy was performed under fluoroscopic guidance. Sclerosant agent was injected through the high-flow Renegade catheter. Sclerosant infusion was stopped when there was filling of the gastric varices and back flow towards the occlusion Balloon.  The high-flow Renegade catheter was flushed and partially withdrawn. The gastrorenal shunt was then coil embolized with interlock coils. The radiate catheter was then exchanged for a Lantern micro catheter and additional embolization performed using Ruby coils. The occlusion balloon was gently deflated. The microcatheter, catheter, and sheath were then gently withdrawn and removed. Hemostasis was achieved at the site. FINDINGS: Large gastrorenal shunt. Gastric varices are supplied by the posterior gastric vein. Portal venous system is patent. Sclerosant was successfully placed occluding the gastric varices near the gastric fundus. IMPRESSION: 1. Successful balloon occlusion retrograde transvenous obliteration (BRTO) of the gastric varices. Electronically Signed   By: Corlis Leak M.D.   On: 05/21/2021 18:04   CT Angio Abd/Pel w/ and/or w/o  Result Date: 05/20/2021 CLINICAL DATA:  65 year old female with NASH cirrhosis, portal hypertension, hematemesis and melena. EXAM: CTA ABDOMEN AND PELVIS WITHOUT AND WITH CONTRAST TECHNIQUE: Multidetector CT imaging of the abdomen and pelvis was performed using the standard protocol during bolus administration of intravenous contrast. Multiplanar reconstructed images and MIPs were obtained and reviewed to evaluate the vascular anatomy. CONTRAST:  OMNIPAQUE IOHEXOL 350 MG/ML SOLN COMPARISON:  Prior CT a abdomen and pelvis  03/12/2021 FINDINGS: VASCULAR Aorta: Normal caliber aorta without aneurysm, dissection, vasculitis or significant stenosis. Scattered atherosclerotic vascular calcifications. Celiac: Patent without evidence  of aneurysm, dissection, vasculitis or significant stenosis. SMA: Patent without evidence of aneurysm, dissection, vasculitis or significant stenosis. Renals: Both renal arteries are patent without evidence of aneurysm, dissection, vasculitis, fibromuscular dysplasia or significant stenosis. IMA: Patent without evidence of aneurysm, dissection, vasculitis or significant stenosis. Inflow: Patent without evidence of aneurysm, dissection, vasculitis or significant stenosis. Proximal Outflow: Bilateral common femoral and visualized portions of the superficial and profunda femoral arteries are patent without evidence of aneurysm, dissection, vasculitis or significant stenosis. Veins: Widely patent main and intrahepatic portal veins. The main portal vein is enlarged at 1.7 cm in diameter. The splenic vein remains patent. Positive for gastro renal shunt with associated gastric varices in the cardia and fundus. Primary Afrin vein appears to be the posterior gastric vein. There are also likely some small short gastric contributors. Small recanalized paraumbilical vein. Review of the MIP images confirms the above findings. NON-VASCULAR Lower chest: Left greater than right lower lobe dependent atelectasis. Small volume aspiration not excluded. The heart is normal in size. Calcification of the mitral valve annulus. No pericardial effusion. Unremarkable distal thoracic esophagus. Hepatobiliary: Nodular hepatic contour with morphologic changes consistent with hepatic cirrhosis. No evidence of arterially enhancing lesion to suggest hepatocellular carcinoma. The gallbladder is surgically absent. No intra or extrahepatic biliary ductal dilatation. Pancreas: Unremarkable. No pancreatic ductal dilatation or surrounding inflammatory  changes. Spleen: Splenomegaly. Adrenals/Urinary Tract: 1.5 cm right adrenal nodule insignificantly changed dating back to July of 2021. Favor benign adenoma. The left adrenal gland is normal. No hydronephrosis, nephrolithiasis or enhancing renal mass. Ureters and bladder are unremarkable. Stomach/Bowel: Rectal tube in place. Diffuse submucosal thickening of the rectum, sigmoid colon and to a lesser extent the descending colon. No evidence of bowel obstruction. Gastric varices are present. The duodenum and proximal jejunum are unremarkable. Lymphatic: No suspicious lymphadenopathy. Reproductive: Status post hysterectomy. No adnexal masses. Other: Small volume ascites. Musculoskeletal: No acute fracture or aggressive appearing lytic or blastic osseous lesion. Multilevel degenerative disc disease. IMPRESSION: VASCULAR 1. Positive for gastro renal shunt with associated gastric varices in the cardia and fundus. The primary afferent portal venous inflow appears to be via the posterior gastric vein. Suspect some accessory contribution from short gastric veins as well. 2. Widely patent portal venous system. 3. No active bleeding at the time of the examination. 4. Multifocal atherosclerotic calcifications and plaque without evidence of significant stenosis, occlusion or dissection. 5.  Aortic Atherosclerosis (ICD10-I70.0). NON-VASCULAR 1. Hepatic cirrhosis with portal hypertension, splenomegaly and small volume ascites. 2. No evidence of hepatocellular carcinoma. 3. Diffuse submucosal thickening of the sigmoid colon and rectum. Differential considerations include infectious/inflammatory colitis versus portal colopathy. 4. Additional ancillary findings as above. Signed, Sterling Big, MD, RPVI [facility] Electronically Signed   By: Malachy Moan M.D.   On: 05/20/2021 16:51    Labs:  CBC: Recent Labs    05/20/21 0905 05/20/21 2104 05/21/21 0640  05/21/21 1429 05/22/21 0500  WBC 5.5 5.8 4.3  --  3.0*  HGB 7.3*  7.3* 8.8* 8.2* 7.1* 8.5*  HCT 24.1*  24.4* 27.4* 25.4* 21.0* 26.2*  PLT 93* 91* 84*  --  67*    COAGS: Recent Labs    05/19/21 1525 05/20/21 0905 05/20/21 2104  INR 1.7* 1.6* 1.6*  APTT  --  38*  --     BMP: Recent Labs    05/20/21 0600 05/20/21 2104 05/21/21 0640 05/21/21 1429 05/22/21 0500  NA 142 141 142 146* 139  K 4.2 3.4*  3.0* 3.3* 3.5  CL 111 110 114*  --  114*  CO2 23 23 23   --  20*  GLUCOSE 213* 127* 79  --  121*  BUN 44* 49* 48*  --  38*  CALCIUM 8.5* 8.2* 7.7*  --  7.3*  CREATININE 0.75 0.84 0.91  --  0.94  GFRNONAA >60 >60 >60  --  >60    LIVER FUNCTION TESTS: Recent Labs    05/19/21 1525 05/20/21 2104 05/21/21 0640 05/22/21 0500  BILITOT 1.6* 1.6* 1.5* 1.5*  AST 71* 39 39 54*  ALT 41 34 31 32  ALKPHOS 107 96 73 81  PROT 5.7* 5.6* 5.2* 4.7*  ALBUMIN 2.3* 2.4* 2.2* 1.9*    Assessment and Plan:  History of NASH cirrhosis with associated gastric varices complicated by recurrent GI bleeding s/p BRTO of gastric varices via right femoral approach 05/21/2021 by Dr. 07/21/2021. Patient remains intubated/sedated, briefly opens eyes to pain, does not follow simple commands. Right femoral puncture site stable, right DP dopplerable. Hgb 8.5 today (up from 7.1 yesterday s/p transfusion). CTA abdomen ordered per Dr. Deanne Coffer. Further plans per CCM- appreciate and agree with management. IR to follow.   Electronically Signed: Deanne Coffer, PA-C 05/22/2021, 11:04 AM   I spent a total of 15 Minutes at the the patient's bedside AND on the patient's hospital floor or unit, greater than 50% of which was counseling/coordinating care for GI bleed secondary to gastric varices s/p BRTO.

## 2021-05-22 NOTE — Progress Notes (Addendum)
Pharmacy Antibiotic Note  Ariel Arnold is a 65 y.o. female admitted on 05/19/2021 with hematemesis.  Pt has h/o LLE osteomyelitis on home antibiotics x 6 weeks s/p recent admission 5/2-5/6.   Pharmacy has been consulted to continue Vancomycin and Cefepime dosing.  Unclear of current Vancomycin regimen---pt has had multiple Vancomycin troughs ranging from 9-19 over the last three weeks.  Prescribed Vancomycin 1 g IV q12h on discharge.  Vancomycin levels obtained on 6/3 and 6/4. Vancomycin peak 34 and vancomycin trough 22. Calculated AUC 672 on current regimen  Plan: Adjust vancomycin to 750 q12h (eAUC 504) Continue cefepime 2 g IV q8h Watch renal function Obtain vancomycin levels at steady state after dose adjustment  Height: 5\' 8"  (172.7 cm) Weight: 79.4 kg (175 lb) IBW/kg (Calculated) : 63.9  Temp (24hrs), Avg:97.7 F (36.5 C), Min:97.2 F (36.2 C), Max:98.2 F (36.8 C)  Recent Labs  Lab 05/19/21 1525 05/19/21 2302 05/20/21 0600 05/20/21 0905 05/20/21 2104 05/21/21 0640 05/22/21 0001 05/22/21 0500 05/22/21 0928  WBC 2.7* 3.2* 3.8* 5.5 5.8 4.3  --  3.0*  --   CREATININE 0.72  --  0.75  --  0.84 0.91  --  0.94  --   VANCOTROUGH  --  9*  --   --   --   --   --   --  22*  VANCOPEAK  --   --   --   --   --   --  34  --   --     Estimated Creatinine Clearance: 65.1 mL/min (by C-G formula based on SCr of 0.94 mg/dL).    No Known Allergies  07/22/21, PharmD Clinical Pharmacist  05/22/2021 2:12 PM

## 2021-05-23 DIAGNOSIS — K920 Hematemesis: Secondary | ICD-10-CM | POA: Diagnosis not present

## 2021-05-23 LAB — CBC
HCT: 27.9 % — ABNORMAL LOW (ref 36.0–46.0)
Hemoglobin: 8.8 g/dL — ABNORMAL LOW (ref 12.0–15.0)
MCH: 29.5 pg (ref 26.0–34.0)
MCHC: 31.5 g/dL (ref 30.0–36.0)
MCV: 93.6 fL (ref 80.0–100.0)
Platelets: 74 10*3/uL — ABNORMAL LOW (ref 150–400)
RBC: 2.98 MIL/uL — ABNORMAL LOW (ref 3.87–5.11)
RDW: 22.4 % — ABNORMAL HIGH (ref 11.5–15.5)
WBC: 5 10*3/uL (ref 4.0–10.5)
nRBC: 0 % (ref 0.0–0.2)

## 2021-05-23 LAB — COMPREHENSIVE METABOLIC PANEL
ALT: 33 U/L (ref 0–44)
AST: 50 U/L — ABNORMAL HIGH (ref 15–41)
Albumin: 2 g/dL — ABNORMAL LOW (ref 3.5–5.0)
Alkaline Phosphatase: 89 U/L (ref 38–126)
Anion gap: 3 — ABNORMAL LOW (ref 5–15)
BUN: 30 mg/dL — ABNORMAL HIGH (ref 8–23)
CO2: 18 mmol/L — ABNORMAL LOW (ref 22–32)
Calcium: 7.5 mg/dL — ABNORMAL LOW (ref 8.9–10.3)
Chloride: 117 mmol/L — ABNORMAL HIGH (ref 98–111)
Creatinine, Ser: 0.92 mg/dL (ref 0.44–1.00)
GFR, Estimated: >60 mL/min (ref >60)
Glucose, Bld: 97 mg/dL (ref 70–99)
Potassium: 3.8 mmol/L (ref 3.5–5.1)
Sodium: 138 mmol/L (ref 135–145)
Total Bilirubin: 1.5 mg/dL — ABNORMAL HIGH (ref 0.3–1.2)
Total Protein: 5.1 g/dL — ABNORMAL LOW (ref 6.5–8.1)

## 2021-05-23 LAB — AMMONIA: Ammonia: 38 umol/L — ABNORMAL HIGH (ref 9–35)

## 2021-05-23 LAB — GLUCOSE, CAPILLARY
Glucose-Capillary: 126 mg/dL — ABNORMAL HIGH (ref 70–99)
Glucose-Capillary: 138 mg/dL — ABNORMAL HIGH (ref 70–99)
Glucose-Capillary: 156 mg/dL — ABNORMAL HIGH (ref 70–99)
Glucose-Capillary: 161 mg/dL — ABNORMAL HIGH (ref 70–99)
Glucose-Capillary: 95 mg/dL (ref 70–99)
Glucose-Capillary: 97 mg/dL (ref 70–99)

## 2021-05-23 NOTE — Progress Notes (Addendum)
NAME:  Ariel Arnold, MRN:  [id number], DOB:  1956-08-11, LOS: 3 ADMISSION DATE:  05/19/2021, CONSULTATION DATE:  6/2 REFERRING MD:  , CHIEF COMPLAINT:  hematemesis   History of Present Illness:  Per chart review, unable to obtain from patient due to clinical status   Ariel Arnold is a 65 yo F who presented to the [facility] on 6/1 with complaints of hematemesis  She has a past medical history of recurrent GIB, hepatic encephalopathy, cirrhosis secondary to NASH, hepatic encephalopathy, osteomyelitis with partial amputation of the left foot.   The morning of 6/1 Ariel Arnold woke up nauseous and had one reported episode of emesis with red blood. EMS was called and she was transported to the [facility] ED. It is unclear from documentation if she was having blood in stools. Her ED workup was notable for HGB of 8, PLT 62, INR of 1.7, WBC 2.7. She was started on IV octreotide, Protonix drip, and IV ceftriaxone. GI was consulted and completed a EGD on 6/2 with no ulcer or infiltrating process seen, but concern for gastric variceal bleed. She was intubated for the procedure and remained intubated post procedure. She received a total of 3u PRBC.  A transfer request to [facility] was placed for need for IR consult for treatment of gastric varix (TIPS vs embo).  PCCM was consulted for management post transfer.  Pertinent  Medical History  Recurrent GIB, hepatic encephalopathy, cirrhosis secondary to NASH, hepatic encephalopathy, osteomyelitis with partial amputation of the left foot.   Significant Hospital Events: Including procedures, antibiotic start and stop dates in addition to other pertinent events   . 6/1 Presented to [facility], Ceftriaxone>, 1 UPRBC given . 6/2 Upper EGD-No ulcer or infiltrating process seen. Concern for gastric variceal bleed. TXR to [facility] . 6/3 to IR for BRTO of gastric varices,hemoglobin stable  Interim History / Subjective:  6/5: stable cta f/u of BRTO. Will d/c octreotide. Will also perform  sat/sbt today in hopes of extubation in next 24-48 hours. hgb remains stable 6/4: place on sbt. hgb stable. Reportedly successfuly BRTO. Still drowsy on exam and not following commands. cta planned per IR today will certainly hold extubation attemtps for that image 6/3: type and screen, pt slated for IR for intervention. Can likely extubate after.   Objective   Blood pressure (!) 123/51, pulse 71, temperature 98 F (36.7 C), temperature source Oral, resp. rate 18, height 5\' 8"  (1.727 m), weight 79.4 kg, SpO2 98 %.    Vent Mode: PRVC FiO2 (%):  [40 %] 40 % Set Rate:  [18 bmp] 18 bmp Vt Set:  [510 mL] 510 mL PEEP:  [5 cmH20] 5 cmH20 Plateau Pressure:  [20 cmH20-22 cmH20] 21 cmH20   Intake/Output Summary (Last 24 hours) at 05/23/2021 0748 Last data filed at 05/23/2021 0600 Gross per 24 hour  Intake 3177.82 ml  Output 1725 ml  Net 1452.82 ml   Filed Weights   05/19/21 1232  Weight: 79.4 kg    Examination: General: Adult female, off sedation but still sleepy Neuro: intermittently following commands. Still drowsy as sedation is wearing off HEENT: [place]/AT. Sclerae anicteric. EOMI. Cardiovascular: RRR, no M/R/G.  Lungs: Respirations even and unlabored.  CTA bilaterally, No W/R/R. Abdomen: BS x 4, soft, NT/ND.  Musculoskeletal: Left transmetatarsal amputation with dressing C/D/I.  R great and 3rd toe amputations, no edema.  Skin: Intact, warm, no rashes.   Labs/imaging that I havepersonally reviewed  (right click and "Reselect all SmartList Selections" daily)  CTA abd / pelvis 6/2 > gastro renal shunt with gastric varices in cardia and fundus, patent portal venous system, no active bleeding.  Hepatic cirrhosis with portal hypertension, splenomegaly, small volume ascites, no evidence of HCC, thickening of sigmoid colon and rectum.  Assessment & Plan:   Acute Upper GI Bleed - felt to be due to gastric varices (no active bleeding seen on EGD). Acute Blood Loss Anemia - 2/2 above. S/p 1u  PRBC. -  S/p BRTO, hgb stable at this time.  - Continue empiric PPI,  Cefepime (was already on Cefepime for osteo, see below). -discontinue octreotide - Transfuse PRBC if HBG less than 7.   Liver Cirrhosis secondary to NASH. Hx of Hepatic Encephalopathy. - Supportive care. - No acute interventions required. -coags acceptable  Thrombocytopenia.  - Monitor for signs of bleeding. -improving slightly   Left Foot Osteomyelitis. Hospitalized at [facility] 5/2-5/6, started on IV Vanc and Cefepime with goals for 6 weeks of treatment. - Empiric Vanc and Cefepime as above. - Wound care consult.  Hx DM. - SSI. - Hold home Metformin, Novolog.  Hx HTN, HLD. - Hold home Carvedilol, Furosemide, Pravastatin, Spironolactone.  Best practice (right click and "Reselect all SmartList Selections" daily)  Diet:  NPO Pain/Anxiety/Delirium protocol (if indicated): Yes (RASS goal -1) VAP protocol (if indicated): Yes DVT prophylaxis: Contraindicated GI prophylaxis: PPI Glucose control:  SSI Yes Central venous access:  N/A Arterial line:  N/A Foley:  N/A Mobility:  bed rest  PT consulted: Yes Last date of multidisciplinary goals of care discussion: None yet Code Status:  full code Disposition: ICU  Labs   CBC: Recent Labs  Lab 05/19/21 1525 05/19/21 2302 05/20/21 0905 05/20/21 2104 05/21/21 0640 05/21/21 1429 05/22/21 0500 05/23/21 0322  WBC 2.7*   < > 5.5 5.8 4.3  --  3.0* 5.0  NEUTROABS 1.7  --   --   --   --   --   --   --   HGB 8.0*   < > 7.3*  7.3* 8.8* 8.2* 7.1* 8.5* 8.8*  HCT 26.5*   < > 24.1*  24.4* 27.4* 25.4* 21.0* 26.2* 27.9*  MCV 98.1   < > 99.2 92.6 93.0  --  92.9 93.6  PLT 62*   < > 93* 91* 84*  --  67* 74*   < > = values in this interval not displayed.    Basic Metabolic Panel: Recent Labs  Lab 05/20/21 0600 05/20/21 2104 05/21/21 0640 05/21/21 1429 05/22/21 0500 05/23/21 0322  NA 142 141 142 146* 139 138  K 4.2 3.4* 3.0* 3.3* 3.5 3.8  CL 111 110 114*  --   114* 117*  CO2 23 23 23   --  20* 18*  GLUCOSE 213* 127* 79  --  121* 97  BUN 44* 49* 48*  --  38* 30*  CREATININE 0.75 0.84 0.91  --  0.94 0.92  CALCIUM 8.5* 8.2* 7.7*  --  7.3* 7.5*  MG  --   --  1.4*  --  2.1  --    GFR: Estimated Creatinine Clearance: 66.6 mL/min (by C-G formula based on SCr of 0.92 mg/dL). Recent Labs  Lab 05/20/21 2104 05/21/21 0640 05/22/21 0500 05/23/21 0322  WBC 5.8 4.3 3.0* 5.0    Liver Function Tests: Recent Labs  Lab 05/19/21 1525 05/20/21 2104 05/21/21 0640 05/22/21 0500 05/23/21 0322  AST 71* 39 39 54* 50*  ALT 41 34 31 32 33  ALKPHOS 107 96 73 81 89  BILITOT 1.6* 1.6* 1.5* 1.5* 1.5*  PROT 5.7* 5.6* 5.2* 4.7* 5.1*  ALBUMIN 2.3* 2.4* 2.2* 1.9* 2.0*   Recent Labs  Lab 05/19/21 1525  LIPASE 24   Recent Labs  Lab 05/19/21 2302 05/20/21 0905 05/21/21 0640 05/22/21 0500 05/23/21 0322  AMMONIA 94* 53* 41* 32 38*    ABG    Component Value Date/Time   PHART 7.337 (L) 05/21/2021 1429   PCO2ART 41.1 05/21/2021 1429   PO2ART 182 (H) 05/21/2021 1429   HCO3 22.0 05/21/2021 1429   TCO2 23 05/21/2021 1429   ACIDBASEDEF 4.0 (H) 05/21/2021 1429   O2SAT 100.0 05/21/2021 1429     Coagulation Profile: Recent Labs  Lab 05/19/21 1525 05/20/21 0905 05/20/21 2104  INR 1.7* 1.6* 1.6*    Cardiac Enzymes: No results for input(s): CKTOTAL, CKMB, CKMBINDEX, TROPONINI in the last 168 hours.  HbA1C: No results found for: HGBA1C  CBG: Recent Labs  Lab 05/22/21 1748 05/22/21 1931 05/22/21 2337 05/23/21 0325 05/23/21 0738  GLUCAP 199* 152* 73 97 126*     Critical care time: The patient is critically ill with multiple organ systems failure and requires high complexity decision making for assessment and support, frequent evaluation and titration of therapies, application of advanced monitoring technologies and extensive interpretation of multiple databases.  Critical care time 37 mins. This represents my time independent of the NPs  time taking care of the pt. This is excluding procedures.    Briant Sites DO [facility] 05/23/2021, 7:48 AM See Amion for pager If no response to pager, please call [phone] until 1900 After 1900 please call [facility] [phone]

## 2021-05-24 ENCOUNTER — Encounter (HOSPITAL_COMMUNITY): Payer: Self-pay | Admitting: Radiology

## 2021-05-24 DIAGNOSIS — D696 Thrombocytopenia, unspecified: Secondary | ICD-10-CM

## 2021-05-24 DIAGNOSIS — G9340 Encephalopathy, unspecified: Secondary | ICD-10-CM

## 2021-05-24 DIAGNOSIS — K7469 Other cirrhosis of liver: Secondary | ICD-10-CM

## 2021-05-24 DIAGNOSIS — K7290 Hepatic failure, unspecified without coma: Secondary | ICD-10-CM

## 2021-05-24 LAB — COMPREHENSIVE METABOLIC PANEL
ALT: 28 U/L (ref 0–44)
AST: 38 U/L (ref 15–41)
Albumin: 2 g/dL — ABNORMAL LOW (ref 3.5–5.0)
Alkaline Phosphatase: 87 U/L (ref 38–126)
Anion gap: 6 (ref 5–15)
BUN: 24 mg/dL — ABNORMAL HIGH (ref 8–23)
CO2: 16 mmol/L — ABNORMAL LOW (ref 22–32)
Calcium: 7.6 mg/dL — ABNORMAL LOW (ref 8.9–10.3)
Chloride: 114 mmol/L — ABNORMAL HIGH (ref 98–111)
Creatinine, Ser: 0.79 mg/dL (ref 0.44–1.00)
GFR, Estimated: >60 mL/min (ref >60)
Glucose, Bld: 117 mg/dL — ABNORMAL HIGH (ref 70–99)
Potassium: 3.8 mmol/L (ref 3.5–5.1)
Sodium: 136 mmol/L (ref 135–145)
Total Bilirubin: 1.9 mg/dL — ABNORMAL HIGH (ref 0.3–1.2)
Total Protein: 4.9 g/dL — ABNORMAL LOW (ref 6.5–8.1)

## 2021-05-24 LAB — GLUCOSE, CAPILLARY
Glucose-Capillary: 112 mg/dL — ABNORMAL HIGH (ref 70–99)
Glucose-Capillary: 138 mg/dL — ABNORMAL HIGH (ref 70–99)
Glucose-Capillary: 141 mg/dL — ABNORMAL HIGH (ref 70–99)
Glucose-Capillary: 122 mg/dL — ABNORMAL HIGH (ref 70–99)
Glucose-Capillary: 124 mg/dL — ABNORMAL HIGH (ref 70–99)
Glucose-Capillary: 157 mg/dL — ABNORMAL HIGH (ref 70–99)

## 2021-05-24 LAB — CBC
HCT: 26.7 % — ABNORMAL LOW (ref 36.0–46.0)
Hemoglobin: 8.4 g/dL — ABNORMAL LOW (ref 12.0–15.0)
MCH: 29.9 pg (ref 26.0–34.0)
MCHC: 31.5 g/dL (ref 30.0–36.0)
MCV: 95 fL (ref 80.0–100.0)
Platelets: 61 10*3/uL — ABNORMAL LOW (ref 150–400)
RBC: 2.81 MIL/uL — ABNORMAL LOW (ref 3.87–5.11)
RDW: 21.2 % — ABNORMAL HIGH (ref 11.5–15.5)
WBC: 4.1 10*3/uL (ref 4.0–10.5)
nRBC: 0 % (ref 0.0–0.2)

## 2021-05-24 LAB — VANCOMYCIN, TROUGH: Vancomycin Tr: 23 ug/mL (ref 15–20)

## 2021-05-24 LAB — AMMONIA: Ammonia: 27 umol/L (ref 9–35)

## 2021-05-24 LAB — VANCOMYCIN, PEAK: Vancomycin Pk: 37 ug/mL (ref 30–40)

## 2021-05-24 LAB — TRIGLYCERIDES: Triglycerides: 113 mg/dL (ref <150)

## 2021-05-24 MED ORDER — LIP MEDEX EX OINT
TOPICAL_OINTMENT | CUTANEOUS | Status: DC | PRN
  Filled 2021-05-24: qty 7

## 2021-05-24 MED ORDER — HYDRALAZINE HCL 20 MG/ML IJ SOLN
10.0000 mg | INTRAMUSCULAR | Status: DC | PRN
  Administered 2021-05-24: 10 mg via INTRAVENOUS
  Filled 2021-05-24: qty 1

## 2021-05-24 MED ORDER — HYDROCORTISONE 1 % EX CREA
TOPICAL_CREAM | Freq: Two times a day (BID) | CUTANEOUS | Status: DC
  Filled 2021-05-24 (×3): qty 28

## 2021-05-24 MED ORDER — NYSTATIN 100000 UNIT/GM EX CREA
TOPICAL_CREAM | Freq: Two times a day (BID) | CUTANEOUS | Status: DC
  Filled 2021-05-24: qty 15

## 2021-05-24 MED ORDER — DIPHENHYDRAMINE HCL 50 MG/ML IJ SOLN
25.0000 mg | Freq: Four times a day (QID) | INTRAMUSCULAR | Status: DC | PRN
  Filled 2021-05-24: qty 1

## 2021-05-24 MED ORDER — CARVEDILOL 3.125 MG PO TABS
3.1250 mg | ORAL_TABLET | Freq: Two times a day (BID) | ORAL | Status: DC
  Administered 2021-05-25 – 2021-05-26 (×3): 3.125 mg via ORAL
  Filled 2021-05-24 (×5): qty 1

## 2021-05-24 MED ORDER — ORAL CARE MOUTH RINSE
15.0000 mL | Freq: Two times a day (BID) | OROMUCOSAL | Status: DC
  Administered 2021-05-24 – 2021-05-26 (×4): 15 mL via OROMUCOSAL

## 2021-05-24 MED ORDER — DIPHENHYDRAMINE HCL 50 MG/ML IJ SOLN
25.0000 mg | Freq: Four times a day (QID) | INTRAMUSCULAR | Status: DC | PRN
  Administered 2021-05-24 – 2021-05-26 (×4): 25 mg via INTRAVENOUS
  Filled 2021-05-24 (×3): qty 1

## 2021-05-24 MED ORDER — VANCOMYCIN HCL 1000 MG/200ML IV SOLN
1000.0000 mg | INTRAVENOUS | Status: DC
  Administered 2021-05-25 – 2021-05-26 (×2): 1000 mg via INTRAVENOUS
  Filled 2021-05-24 (×2): qty 200

## 2021-05-24 MED ORDER — ACETAMINOPHEN 500 MG PO TABS
500.0000 mg | ORAL_TABLET | Freq: Once | ORAL | Status: AC
  Administered 2021-05-24: 500 mg via ORAL
  Filled 2021-05-24: qty 1

## 2021-05-24 NOTE — Progress Notes (Addendum)
Nutrition Follow-up  DOCUMENTATION CODES:   Not applicable  INTERVENTION:   - Glucerna Shake po TID, each supplement provides 220 kcal and 10 grams of protein  - Encourage adequate PO intake  NUTRITION DIAGNOSIS:   Inadequate oral intake related to inability to eat as evidenced by NPO status.  Progressing, diet advanced to heart Healthy/Carb Modified  GOAL:   Patient will meet greater than or equal to 90% of their needs  Progressing  MONITOR:   PO intake,Supplement acceptance,Labs,Weight trends,Skin,I & O's  REASON FOR ASSESSMENT:   Consult Enteral/tube feeding initiation and management  ASSESSMENT:   65 year old female who presented to the ED on 6/01 with hematemesis. PMH of cirrhosis 2/2 NASH, hepatic encephalopathy, HTN, thrombocytopenia, T2DM, osteomyelitis of L foot. Pt admitted with acute upper GI bleed.  6/02 - s/p EGD showing stigmata of recent bleeding but no active bleeding 1 small esophageal varix, gastritis, large gastric varix, intubated for procedure, transferred to [facility] 6/03 - s/p balloon-occluded retrograde transvenous obliteration (BRTO) gastric varices 6/06 - extubated, diet advanced to heart healthy/carb modified  Discussed pt with RN and during ICU rounds.  Spoke with pt at bedside. Pt reports good appetite and eating well at breakfast this morning. Pt reports good appetite at baseline and endorses eating 2-3 meals daily. Pt denies any recent weight loss and reports UBW as 200 lbs. Weight on admission documented as 175 lbs but appears stated rather than measured. No updated weights since admission. Pt with moderate pitting edema to BUE and mild pitting edema to BLE.  Pt willing to consume oral nutrition supplements to aid in meeting elevated kcal and protein needs. RD to order.  Meal Completion: 75% x 1 documented meal  Medications reviewed and include: SSI q 4 hours, protonix, IV abx, IV magnesium sulfate 2 grams once  Labs reviewed: magnesium  1.5, hemoglobin 9.5 CBG's: 93-157 x 24 hours  UOP: 3750 ml x 24 hours I/O's: +6.6 L since admit  Diet Order:   Diet Order            Diet heart healthy/carb modified Room service appropriate? No; Fluid consistency: Thin  Diet effective now                 EDUCATION NEEDS:   No education needs have been identified at this time  Skin:  Skin Assessment: Skin Integrity Issues: Stage II: buttocks Other: non-pressure wound to left foot  Last BM:  05/24/21 type 7 via rectal tube  Height:   Ht Readings from Last 1 Encounters:  05/21/21 5\' 8"  (1.727 m)    Weight:   Wt Readings from Last 1 Encounters:  05/19/21 79.4 kg    BMI:  Body mass index is 26.61 kg/m.  Estimated Nutritional Needs:   Kcal:  1850-2050  Protein:  90-110 grams  Fluid:  >/= 1.8 L    07/19/21, MS, RD, LDN Inpatient Clinical Dietitian Please see AMiON for contact information.

## 2021-05-24 NOTE — Consult Note (Signed)
[facility] for Infectious Disease    Date of Admission:  05/19/2021   Total days of antibiotics 30               Reason for Consult: Left foot osteomyelitis    Referring Provider: Marshell Garfinkel, MD Primary Care Provider: Jacqualine Code, DO  Assessment: Patient has history of osteomyelitis of her left foot status post 2nd-5th metatarsal amputation.  She was admitted on May for osteomyelitis of her left big toe found on foot x-ray.  Wound culture grew mixed anaerobes with gram-positive cocci, gram-positive bacilli and gram-negative rods.  Amputation was offered but patient declined.  She was started on 6 weeks of IV vancomycin and cefepime.  Patient has been receiving IV antibiotics at home with home health.  The definitive treatment for her osteomyelitis will be left foot amputation however patient declined.  We will continue vancomycin and cefepime to finish 6 weeks treatment. Ideally will also need to cover for anaerobes but will do vancomycin and cefepime for now.  End date will be 06/02/2021.  If patient does not improve after 6 weeks of antibiotic, she will need the amputation.  Do not recommend additional antibiotics after this course.  Plan: 1. Continue vancomycin and cefepime for next 6 weeks treatment.  End date 6/15/122.  Patient has home health set up for her IV antibiotics.   Principal Problem:   Acute GI bleeding Active Problems:   Liver cirrhosis secondary to NASH (HCC)   Hepatic encephalopathy (HCC)   Thrombocytopenia (HCC)   Acute osteomyelitis of left foot (HCC)   Encephalopathy   Hematemesis   Pressure injury of skin   Scheduled Meds: . sodium chloride   Intravenous Once  . sodium chloride   Intravenous Once  . carvedilol  3.125 mg Oral BID WC  . chlorhexidine gluconate (MEDLINE KIT)  15 mL Mouth Rinse BID  . docusate  100 mg Per Tube BID  . hydrocortisone cream   Topical BID  . insulin aspart  0-15 Units Subcutaneous Q4H  . mouth rinse  15  mL Mouth Rinse 10 times per day  . mupirocin ointment  1 application Nasal BID  . pantoprazole  40 mg Intravenous Q12H  . polyethylene glycol  17 g Per Tube Daily  . sodium chloride flush  10-40 mL Intracatheter Q12H   Continuous Infusions: . sodium chloride 75 mL/hr at 05/24/21 1129  . ceFEPime (MAXIPIME) IV 2 g (05/24/21 1354)  . fentaNYL infusion INTRAVENOUS 50 mcg/hr (05/24/21 0700)  . propofol (DIPRIVAN) infusion 10 mcg/kg/min (05/24/21 0700)  . vancomycin 150 mL/hr at 05/24/21 1048   PRN Meds:.acetaminophen **OR** acetaminophen, diphenhydrAMINE, fentaNYL (SUBLIMAZE) injection, fentaNYL (SUBLIMAZE) injection, haloperidol lactate, hydrALAZINE, ondansetron **OR** ondansetron (ZOFRAN) IV, sodium chloride flush  HPI: Ariel Arnold is a 65 y.o. female history of type 2 diabetes, cirrhosis, hepatic encephalopathy, who was admitted to the hospital due to hematemesis.  She underwent BR TL procedure and received 3 units of blood.  Hemoglobin currently stable.  Patient has history of osteomyelitis of her left foot status post 2nd-5th metatarsal amputation.  She was admitted on May for osteomyelitis of her left big toe found on foot x-ray.  Amputation was offered but patient declined.  She was started on 6 weeks of IV vancomycin and cefepime.  Patient could not provide any history because she was extubated 1 hour prior.  She appears drowsy but responds to verbal stimulation.   Review of Systems: Unable  to obtain due to mental status ROS  Past Medical History:  Diagnosis Date  . Anemia   . Aortic atherosclerosis ([place])   . AVM (arteriovenous malformation) of colon   . Essential hypertension   . GI bleeding    Recurrent  . Hepatic encephalopathy ([place])   . History of RSV infection   . Liver cirrhosis secondary to NASH ([place]) 2004   Biopsy-proven  . Obesity   . Osteomyelitis ([place])   . Thrombocytopenia ([place])   . Type 2 diabetes mellitus (HCC)     Social History   Tobacco Use  .  Smoking status: Former Smoker    Types: Cigarettes  . Smokeless tobacco: Never Used  Vaping Use  . Vaping Use: Never used  Substance Use Topics  . Alcohol use: Yes  . Drug use: Never    Family History  Problem Relation Age of Onset  . Heart failure Mother   . Hyperlipidemia Mother   . Diabetes Mellitus II Mother   . Heart failure Father   . Hyperlipidemia Father    Allergies  Allergen Reactions  . Chlorhexidine Rash    OBJECTIVE: Blood pressure (!) 168/68, pulse 92, temperature 98.1 F (36.7 C), temperature source Axillary, resp. rate 16, height 5' 8" (1.727 m), weight 79.4 kg, SpO2 94 %.  Physical Exam Constitutional:      General: She is not in acute distress.    Appearance: She is ill-appearing.     Comments: Patient is drowsy but responds to verbal stimulation  Eyes:     General: No scleral icterus.       Right eye: No discharge.        Left eye: No discharge.     Conjunctiva/sclera: Conjunctivae normal.  Cardiovascular:     Rate and Rhythm: Normal rate and regular rhythm.     Heart sounds: Normal heart sounds.  Pulmonary:     Effort: Pulmonary effort is normal. No respiratory distress.  Musculoskeletal:     Comments: Left foot pain history appears clean.  No erythema or pain to palpation of left foot.  Neurological:     Comments: She cannot provide any history.  Cannot say her name.     Lab Results Lab Results  Component Value Date   WBC 4.1 05/24/2021   HGB 8.4 (L) 05/24/2021   HCT 26.7 (L) 05/24/2021   MCV 95.0 05/24/2021   PLT 61 (L) 05/24/2021    Lab Results  Component Value Date   CREATININE 0.79 05/24/2021   BUN 24 (H) 05/24/2021   NA 136 05/24/2021   K 3.8 05/24/2021   CL 114 (H) 05/24/2021   CO2 16 (L) 05/24/2021    Lab Results  Component Value Date   ALT 28 05/24/2021   AST 38 05/24/2021   ALKPHOS 87 05/24/2021   BILITOT 1.9 (H) 05/24/2021     Microbiology: Recent Results (from the past 240 hour(s))  Resp Panel by RT-PCR (Flu  A&B, Covid) Nasopharyngeal Swab     Status: None   Collection Time: 05/19/21  4:30 PM   Specimen: Nasopharyngeal Swab; Nasopharyngeal(NP) swabs in vial transport medium  Result Value Ref Range Status   SARS Coronavirus 2 by RT PCR NEGATIVE NEGATIVE Final    Comment: (NOTE) SARS-CoV-2 target nucleic acids are NOT DETECTED.  The SARS-CoV-2 RNA is generally detectable in upper respiratory specimens during the acute phase of infection. The lowest concentration of SARS-CoV-2 viral copies this assay can detect is 138 copies/mL. A negative result does not  preclude SARS-Cov-2 infection and should not be used as the sole basis for treatment or other patient management decisions. A negative result may occur with  improper specimen collection/handling, submission of specimen other than nasopharyngeal swab, presence of viral mutation(s) within the areas targeted by this assay, and inadequate number of viral copies(<138 copies/mL). A negative result must be combined with clinical observations, patient history, and epidemiological information. The expected result is Negative.  Fact Sheet for Patients:  EntrepreneurPulse.com.au  Fact Sheet for Healthcare Providers:  IncredibleEmployment.be  This test is no t yet approved or cleared by the [place] FDA and  has been authorized for detection and/or diagnosis of SARS-CoV-2 by FDA under an Emergency Use Authorization (EUA). This EUA will remain  in effect (meaning this test can be used) for the duration of the COVID-19 declaration under Section 564(b)(1) of the Act, 21 U.S.C.section 360bbb-3(b)(1), unless the authorization is terminated  or revoked sooner.       Influenza A by PCR NEGATIVE NEGATIVE Final   Influenza B by PCR NEGATIVE NEGATIVE Final    Comment: (NOTE) The Xpert Xpress SARS-CoV-2/FLU/RSV plus assay is intended as an aid in the diagnosis of influenza from Nasopharyngeal swab specimens  and should not be used as a sole basis for treatment. Nasal washings and aspirates are unacceptable for Xpert Xpress SARS-CoV-2/FLU/RSV testing.  Fact Sheet for Patients: EntrepreneurPulse.com.au  Fact Sheet for Healthcare Providers: IncredibleEmployment.be  This test is not yet approved or cleared by the [place] FDA and has been authorized for detection and/or diagnosis of SARS-CoV-2 by FDA under an Emergency Use Authorization (EUA). This EUA will remain in effect (meaning this test can be used) for the duration of the COVID-19 declaration under Section 564(b)(1) of the Act, 21 U.S.C. section 360bbb-3(b)(1), unless the authorization is terminated or revoked.  Performed at [facility], [address] [place], [place] [address]   MRSA PCR Screening     Status: Abnormal   Collection Time: 05/20/21  8:10 AM   Specimen: Nasal Mucosa; Nasopharyngeal  Result Value Ref Range Status   MRSA by PCR POSITIVE (A) NEGATIVE Final    Comment:        The GeneXpert MRSA Assay (FDA approved for NASAL specimens only), is one component of a comprehensive MRSA colonization surveillance program. It is not intended to diagnose MRSA infection nor to guide or monitor treatment for MRSA infections. RESULT CALLED TO, READ BACK BY AND VERIFIED WITH: F [email or web address],RN_[email or web address]  05/21/21 [facility] Performed at [facility], [address] [place], [place] [address]   MRSA PCR Screening     Status: None   Collection Time: 05/20/21  8:42 PM  Result Value Ref Range Status   MRSA by PCR NEGATIVE NEGATIVE Final    Comment:        The GeneXpert MRSA Assay (FDA approved for NASAL specimens only), is one component of a comprehensive MRSA colonization surveillance program. It is not intended to diagnose MRSA infection nor to guide or monitor treatment for MRSA infections. Performed at [facility], [place] [address] [place], [place] [address]     Gaylan Gerold,  [facility] [place] [facility] [phone] pager   [phone] cell 05/24/2021, 2:32 PM

## 2021-05-24 NOTE — Procedures (Signed)
Extubation Procedure Note  Patient Details:   Name: Ariel Arnold DOB: 07/19/1956 MRN: [id number]   Airway Documentation:    Vent end date: 05/24/21 Vent end time: 1230   Evaluation  O2 sats: stable throughout Complications: No apparent complications Patient did tolerate procedure well. Bilateral Breath Sounds: Clear,Diminished   No - patient not speaking.  Positive cuff leak noted. Patient placed on [place] 4L with humidity, no stridor noted. Patient not following all commands at this point. IS not started.  RN and MD at bedside.  Rayburn Felt 05/24/2021, 12:39 PM

## 2021-05-24 NOTE — Progress Notes (Signed)
PHARMACY CONSULT NOTE FOR:  OUTPATIENT  PARENTERAL ANTIBIOTIC THERAPY (OPAT)  Indication: Left foot osteomyelitis Regimen: Vancomycin 750 mg every 12 hours + Cefepime 2 gm IV Q 8 hours  End date: 06/02/21  IV antibiotic discharge orders are pended. To discharging provider:  please sign these orders via discharge navigator,  Select New Orders & click on the button choice - Manage This Unsigned Work.     Thank you for allowing pharmacy to be a part of this patient's care.  Sharin Mons, PharmD, BCPS, BCIDP Infectious Diseases Clinical Pharmacist Phone: [phone] 05/24/2021, 3:52 PM

## 2021-05-24 NOTE — Progress Notes (Addendum)
NAME:  Ariel Arnold, MRN:  [id number], DOB:  [birth year], LOS: 4 ADMISSION DATE:  05/19/2021, CONSULTATION DATE:  6/2 REFERRING MD:  , CHIEF COMPLAINT:  hematemesis   History of Present Illness:  Per chart review, unable to obtain from patient due to clinical status   Ariel Arnold is a 65 yo F who [facility] to the [facility] on 6/1 with complaints of hematemesis  She has a past medical history of recurrent GIB, hepatic encephalopathy, cirrhosis secondary to NASH, hepatic encephalopathy, osteomyelitis with partial amputation of the left foot.   The morning of 6/1 Ms. Hansell woke up nauseous and had one reported episode of emesis with red blood. EMS was called and she was transported to the [facility] ED. It is unclear from documentation if she was having blood in stools. Her ED workup was notable for HGB of 8, PLT 62, INR of 1.7, WBC 2.7. She was started on IV octreotide, Protonix drip, and IV ceftriaxone. GI was consulted and completed a EGD on 6/2 with no ulcer or infiltrating process seen, but concern for gastric variceal bleed. She was intubated for the procedure and remained intubated post procedure. She received a total of 3u PRBC.  A transfer request to [facility] was placed for need for IR consult for treatment of gastric varix (TIPS vs embo).  PCCM was consulted for management post transfer.  Pertinent  Medical History  Recurrent GIB, hepatic encephalopathy, cirrhosis secondary to NASH, hepatic encephalopathy, osteomyelitis with partial amputation of the left foot.   Significant Hospital Events: Including procedures, antibiotic start and stop dates in addition to other pertinent events   . 6/1 [facility]ed to [facility], Ceftriaxone>, 1 UPRBC given . 6/2 Upper EGD-No ulcer or infiltrating process seen. Concern for gastric variceal bleed. TXR to [facility] . 6/3 Underwent successful IR for BRTO of gastric varices,hemoglobin stable . 6/4 CTA showing successful embolization introduction of gastroesophageal  varices . 6/5 off octreotide, starting SBT's  Interim History / Subjective:   On weaning trials, no more episodes of acute bleed noted  Objective   Blood pressure (!) 127/56, pulse 74, temperature 97.8 F (36.6 C), temperature source Oral, resp. rate 18, height 5\' 8"  (1.727 m), weight 79.4 kg, SpO2 98 %.    Vent Mode: PRVC FiO2 (%):  [40 %] 40 % Set Rate:  [18 bmp] 18 bmp Vt Set:  [510 mL] 510 mL PEEP:  [5 cmH20] 5 cmH20 Pressure Support:  [5 cmH20] 5 cmH20 Plateau Pressure:  [20 cmH20-21 cmH20] 21 cmH20   Intake/Output Summary (Last 24 hours) at 05/24/2021 0738 Last data filed at 05/24/2021 0700 Gross per 24 hour  Intake 2425.81 ml  Output 2575 ml  Net -149.19 ml   Filed Weights   05/19/21 1232  Weight: 79.4 kg    Examination: Gen:      No acute distress, chronically ill HEENT:  EOMI, sclera anicteric Neck:     No masses; no thyromegaly, ETT Lungs:    Clear to auscultation bilaterally; normal respiratory effort CV:         Regular rate and rhythm; no murmurs Abd:      + bowel sounds; soft, non-tender; no palpable masses, no distension Ext:    No edema; adequate peripheral perfusion.  Left metatarsal amputation.  Right toe amputation Skin:      Warm and dry; no rash Neuro: Sedated, arousable  Labs/imaging that I havepersonally reviewed  (right click and "Reselect all SmartList Selections" daily)   Hemoglobin stable at 8.4.  No new   Assessment & Plan:   Acute Upper GI Bleed - felt to be due to gastric varices (no active bleeding seen on EGD).  S/p BRTO Acute Blood Loss Anemia PPI twice daily.  Off octreotide Follow CBC  Acute respiratory failure On PSV weans.  May be able to extubate once off sedation and mental status improves.  Liver Cirrhosis secondary to NASH. Hx of Hepatic Encephalopathy. Supportive care  Chronic thrombocytopenia secondary to liver disease Follow labs  Left Foot Osteomyelitis. Hospitalized at [facility] 5/2-5/6, started on IV Vanc and  Cefepime with goals for 6 weeks of treatment. Continue Vanco, cefepime Wound care consult  Hx DM. SSI Holding home insulin med  Hx HTN, HLD. Hold home Carvedilol, Furosemide, Pravastatin, Spironolactone.  Daughter updated 6/6  Best practice (right click and "Reselect all SmartList Selections" daily)  Diet:  NPO Pain/Anxiety/Delirium protocol (if indicated): Yes (RASS goal -1) VAP protocol (if indicated): Yes DVT prophylaxis: Contraindicated GI prophylaxis: PPI Glucose control:  SSI Yes Central venous access:  N/A Arterial line:  N/A Foley:  N/A Mobility:  bed rest  PT consulted: Yes Last date of multidisciplinary goals of care discussion: None yet Code Status:  full code Disposition: ICU  Critical care time:   The patient is critically ill with multiple organ system failure and requires high complexity decision making for assessment and support, frequent evaluation and titration of therapies, advanced monitoring, review of radiographic studies and interpretation of complex data.   Critical Care Time devoted to patient care services, exclusive of separately billable procedures, described in this note is 35 minutes.   Chilton Greathouse MD [facility] See Amion for pager  If no response to pager , please call [phone] until 7pm After 7:00 pm call Elink  [phone] 05/24/2021, 7:38 AM

## 2021-05-24 NOTE — Progress Notes (Signed)
eLink Physician-Brief Progress Note Patient Name: Ariel Arnold DOB: [birth year] MRN: [id number]   Date of Service  05/24/2021  HPI/Events of Note  Tylenol on MAR for pain, pt. has h/o liver disease.  RN asking you to assess, order different pain med.  Extubated this afternoon  GIB. Stable. AST/ALT normal.  eICU Interventions  DC ed standing prn tylenol 650. Ordered once tylenol 500 once.      Intervention Category Minor Interventions: Routine modifications to care plan (e.g. PRN medications for pain, fever)  Ranee Gosselin 05/24/2021, 8:46 PM

## 2021-05-24 NOTE — Progress Notes (Addendum)
Pharmacy Antibiotic Note  Ariel Arnold is a 65 y.o. female admitted on 05/19/2021 with hematemesis.  Pt has h/o LLE osteomyelitis on home antibiotics x 6 weeks s/p recent admission 5/2-5/6.   Pharmacy has been consulted to continue Vancomycin  Vancomycin levels obtained today and calculated AUC of 721 is above goal. Plan: Change Vancomycin 1000 mg IV q24h for estimated AUC of 481  Height: 5\' 8"  (172.7 cm) Weight: 79.4 kg (175 lb) IBW/kg (Calculated) : 63.9  Temp (24hrs), Avg:98.2 F (36.8 C), Min:97.8 F (36.6 C), Max:98.7 F (37.1 C)  Recent Labs  Lab 05/20/21 2104 05/21/21 0640 05/22/21 0001 05/22/21 0500 05/22/21 0928 05/23/21 0322 05/23/21 2354 05/24/21 0340 05/24/21 2136  WBC 5.8 4.3  --  3.0*  --  5.0  --  4.1  --   CREATININE 0.84 0.91  --  0.94  --  0.92  --  0.79  --   VANCOTROUGH  --   --   --   --  22*  --   --   --  23*  VANCOPEAK  --   --  34  --   --   --  37  --   --     Estimated Creatinine Clearance: 76.6 mL/min (by C-G formula based on SCr of 0.79 mg/dL).    Allergies  Allergen Reactions  . Chlorhexidine Rash   2137, PharmD, BCPS   05/24/2021 11:27 PM

## 2021-05-25 ENCOUNTER — Inpatient Hospital Stay (HOSPITAL_COMMUNITY): Payer: Medicare Other

## 2021-05-25 ENCOUNTER — Inpatient Hospital Stay: Payer: Self-pay

## 2021-05-25 LAB — TYPE AND SCREEN
ABO/RH(D): A POS
Antibody Screen: NEGATIVE
Unit division: 0
Unit division: 0

## 2021-05-25 LAB — COMPREHENSIVE METABOLIC PANEL
ALT: 27 U/L (ref 0–44)
AST: 39 U/L (ref 15–41)
Albumin: 2.2 g/dL — ABNORMAL LOW (ref 3.5–5.0)
Alkaline Phosphatase: 96 U/L (ref 38–126)
Anion gap: 5 (ref 5–15)
BUN: 19 mg/dL (ref 8–23)
CO2: 19 mmol/L — ABNORMAL LOW (ref 22–32)
Calcium: 8.2 mg/dL — ABNORMAL LOW (ref 8.9–10.3)
Chloride: 111 mmol/L (ref 98–111)
Creatinine, Ser: 0.87 mg/dL (ref 0.44–1.00)
GFR, Estimated: >60 mL/min (ref >60)
Glucose, Bld: 105 mg/dL — ABNORMAL HIGH (ref 70–99)
Potassium: 3.7 mmol/L (ref 3.5–5.1)
Sodium: 135 mmol/L (ref 135–145)
Total Bilirubin: 1.5 mg/dL — ABNORMAL HIGH (ref 0.3–1.2)
Total Protein: 5.5 g/dL — ABNORMAL LOW (ref 6.5–8.1)

## 2021-05-25 LAB — CBC
HCT: 29.7 % — ABNORMAL LOW (ref 36.0–46.0)
Hemoglobin: 9.5 g/dL — ABNORMAL LOW (ref 12.0–15.0)
MCH: 30.4 pg (ref 26.0–34.0)
MCHC: 32 g/dL (ref 30.0–36.0)
MCV: 95.2 fL (ref 80.0–100.0)
Platelets: 58 10*3/uL — ABNORMAL LOW (ref 150–400)
RBC: 3.12 MIL/uL — ABNORMAL LOW (ref 3.87–5.11)
RDW: 20.8 % — ABNORMAL HIGH (ref 11.5–15.5)
WBC: 3.4 10*3/uL — ABNORMAL LOW (ref 4.0–10.5)
nRBC: 0 % (ref 0.0–0.2)

## 2021-05-25 LAB — BPAM RBC
Blood Product Expiration Date: 202206222359
Blood Product Expiration Date: 202206222359
ISSUE DATE / TIME: 202206031426
ISSUE DATE / TIME: 202206031426
Unit Type and Rh: 6200
Unit Type and Rh: 6200

## 2021-05-25 LAB — GLUCOSE, CAPILLARY
Glucose-Capillary: 109 mg/dL — ABNORMAL HIGH (ref 70–99)
Glucose-Capillary: 227 mg/dL — ABNORMAL HIGH (ref 70–99)
Glucose-Capillary: 227 mg/dL — ABNORMAL HIGH (ref 70–99)
Glucose-Capillary: 246 mg/dL — ABNORMAL HIGH (ref 70–99)
Glucose-Capillary: 270 mg/dL — ABNORMAL HIGH (ref 70–99)
Glucose-Capillary: 93 mg/dL (ref 70–99)

## 2021-05-25 LAB — MAGNESIUM: Magnesium: 1.5 mg/dL — ABNORMAL LOW (ref 1.7–2.4)

## 2021-05-25 LAB — AMMONIA: Ammonia: 20 umol/L (ref 9–35)

## 2021-05-25 LAB — PHOSPHORUS: Phosphorus: 2.6 mg/dL (ref 2.5–4.6)

## 2021-05-25 IMAGING — DX DG CHEST 1V PORT
1 series · 1 of 1 positions shown · non-contrast
Comparison: Portable exam [SU] hours compared to [DATE]

CLINICAL DATA: Acute respiratory failure, shortness of breath,
history hypertension, cirrhosis, type II diabetes mellitus

EXAM:
PORTABLE CHEST 1 VIEW

[chest]
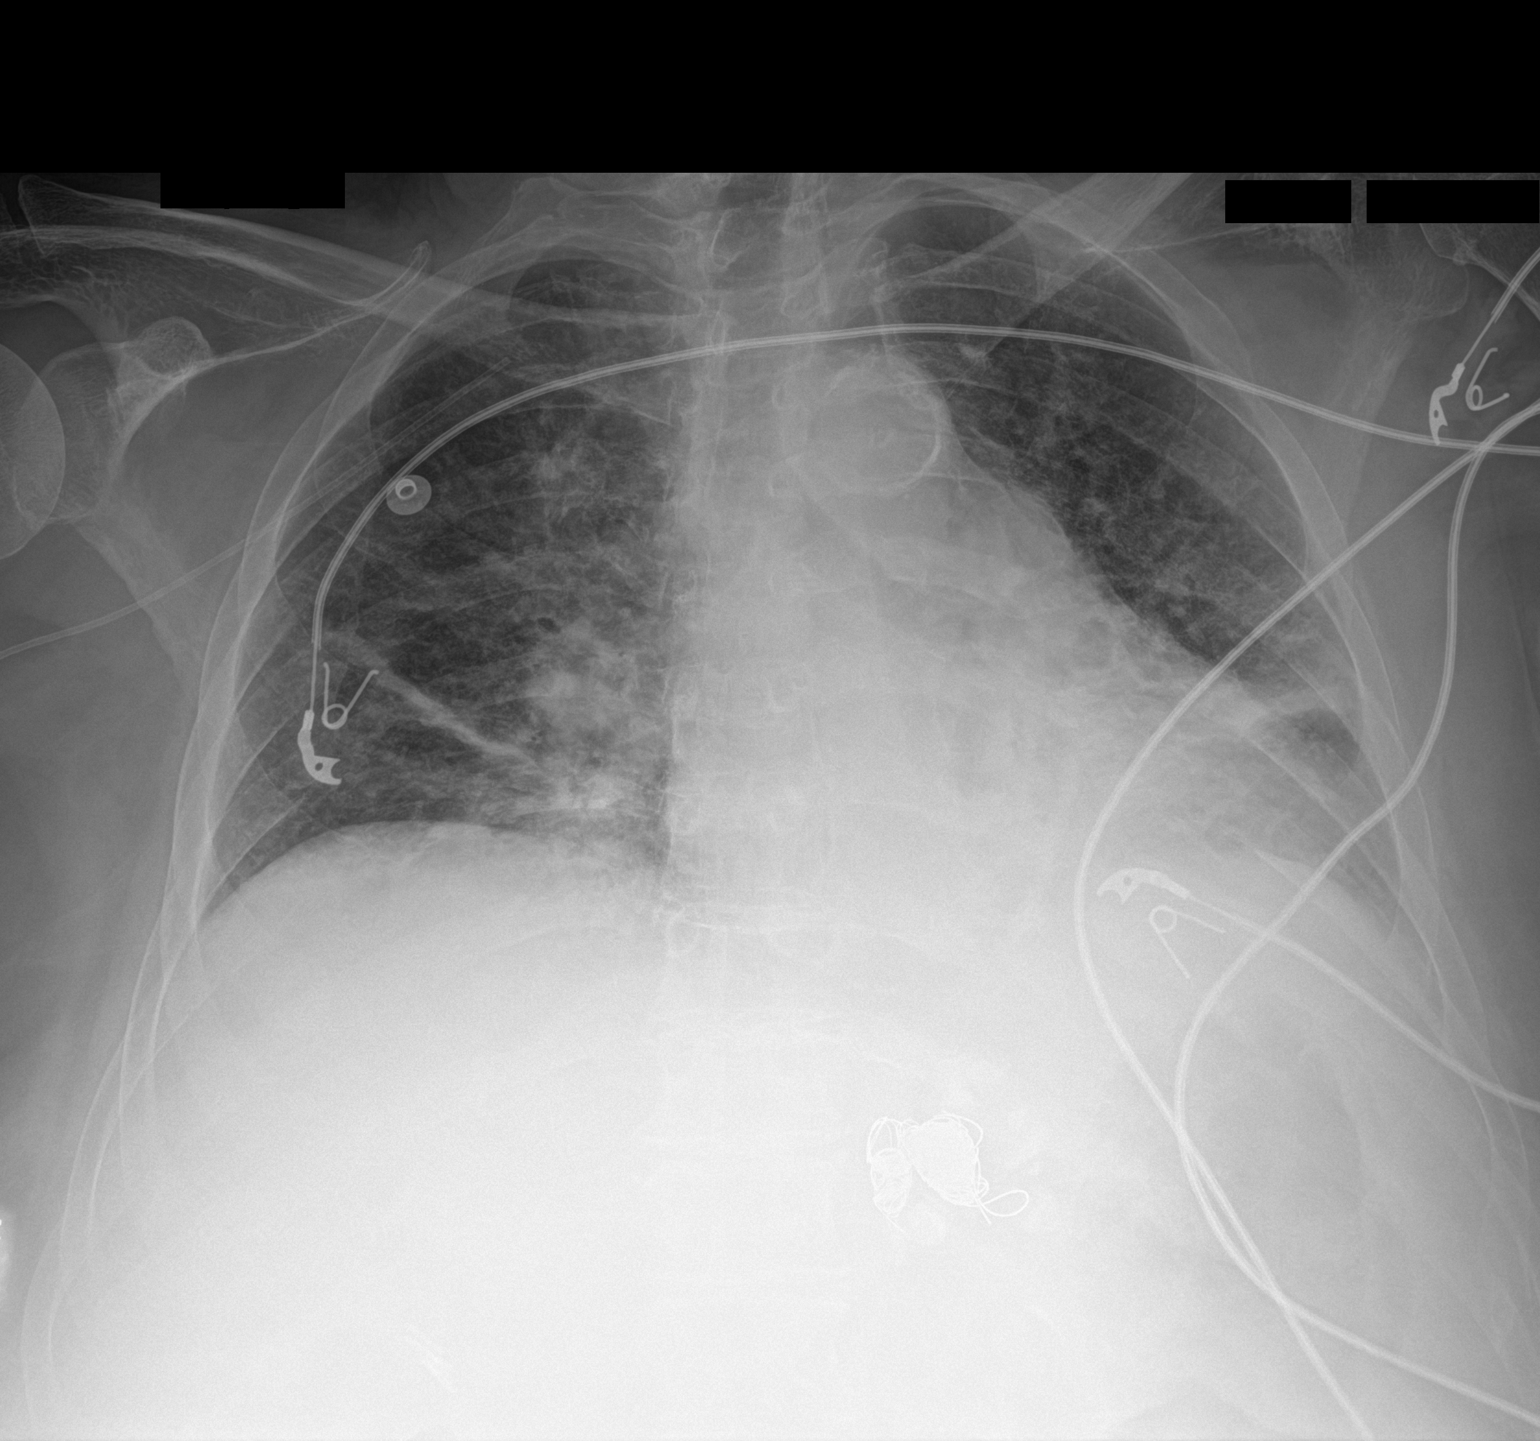

[1 of 1 positions shown; findings below may reference images not displayed]

FINDINGS: Enlargement of cardiac silhouette with pulmonary vascular
congestion.

Atherosclerotic calcification aorta.

Perihilar infiltrates question pulmonary edema.

Bibasilar atelectasis.

No pleural effusion or pneumothorax.

Embolization coils in the LEFT upper quadrant.
IMPRESSION: Enlargement of cardiac silhouette with vascular congestion and
question mild pulmonary edema.

Additional bibasilar atelectasis.

## 2021-05-25 MED ORDER — GLUCERNA SHAKE PO LIQD
237.0000 mL | Freq: Three times a day (TID) | ORAL | Status: DC
  Administered 2021-05-25 – 2021-05-26 (×2): 237 mL via ORAL

## 2021-05-25 MED ORDER — ENSURE ENLIVE PO LIQD
237.0000 mL | Freq: Two times a day (BID) | ORAL | Status: DC
  Administered 2021-05-25: 237 mL via ORAL

## 2021-05-25 MED ORDER — OXYCODONE HCL 5 MG PO TABS
5.0000 mg | ORAL_TABLET | Freq: Four times a day (QID) | ORAL | Status: DC | PRN
  Administered 2021-05-26 (×3): 5 mg via ORAL
  Filled 2021-05-25 (×4): qty 1

## 2021-05-25 MED ORDER — PANTOPRAZOLE SODIUM 40 MG PO TBEC
40.0000 mg | DELAYED_RELEASE_TABLET | Freq: Two times a day (BID) | ORAL | Status: DC
  Administered 2021-05-25 – 2021-05-26 (×3): 40 mg via ORAL
  Filled 2021-05-25 (×3): qty 1

## 2021-05-25 MED ORDER — MAGNESIUM SULFATE 2 GM/50ML IV SOLN
2.0000 g | Freq: Once | INTRAVENOUS | Status: AC
  Administered 2021-05-25: 2 g via INTRAVENOUS
  Filled 2021-05-25: qty 50

## 2021-05-25 NOTE — Progress Notes (Signed)
NAME:  Ariel Arnold, MRN:  [id number], DOB:  12/22/1955, LOS: 5 ADMISSION DATE:  05/19/2021, CONSULTATION DATE:  6/2 REFERRING MD:  , CHIEF COMPLAINT:  hematemesis   History of Present Illness:  Per chart review, unable to obtain from patient due to clinical status   Ariel Arnold is a 65 yo F who [facility] on 6/1 with complaints of hematemesis  She has a past medical history of recurrent GIB, hepatic encephalopathy, cirrhosis secondary to NASH, hepatic encephalopathy, osteomyelitis with partial amputation of the left foot.   The morning of 6/1 Ariel Arnold woke up nauseous and had one reported episode of emesis with red blood. EMS was called and she was transported to the [facility] ED. It is unclear from documentation if she was having blood in stools. Her ED workup was notable for HGB of 8, PLT 62, INR of 1.7, WBC 2.7. She was started on IV octreotide, Protonix drip, and IV ceftriaxone. GI was consulted and completed a EGD on 6/2 with no ulcer or infiltrating process seen, but concern for gastric variceal bleed. She was intubated for the procedure and remained intubated post procedure. She received a total of 3u PRBC.  A transfer request to [facility] was placed for need for IR consult for treatment of gastric varix (TIPS vs embo).  PCCM was consulted for management post transfer.  Pertinent  Medical History  Recurrent GIB, hepatic encephalopathy, cirrhosis secondary to NASH, hepatic encephalopathy, osteomyelitis with partial amputation of the left foot.   Significant Hospital Events: Including procedures, antibiotic start and stop dates in addition to other pertinent events   . 6/1 [facility]ed to [facility], Ceftriaxone>, 1 UPRBC given . 6/2 Upper EGD-No ulcer or infiltrating process seen. Concern for gastric variceal bleed. TXR to [facility] . 6/3 Underwent successful IR for BRTO of gastric varices,hemoglobin stable . 6/4 CTA showing successful embolization introduction of gastroesophageal  varices . 6/5 off octreotide, starting SBT's . 6/6 Extubated  Interim History / Subjective:   Extubated yesterday, stable overnight PICC line displaced  Objective   Blood pressure (!) 153/66, pulse 76, temperature 98.5 F (36.9 C), temperature source Oral, resp. rate (!) 22, height 5\' 8"  (1.727 m), weight 79.4 kg, SpO2 93 %.    Vent Mode: PSV;CPAP FiO2 (%):  [40 %] 40 % PEEP:  [5 cmH20] 5 cmH20 Pressure Support:  [5 cmH20] 5 cmH20   Intake/Output Summary (Last 24 hours) at 05/25/2021 0851 Last data filed at 05/25/2021 0800 Gross per 24 hour  Intake 1337.65 ml  Output 3300 ml  Net -1962.35 ml   Filed Weights   05/19/21 1232  Weight: 79.4 kg    Examination: Gen:      No acute distress HEENT:  EOMI, sclera anicteric Neck:     No masses; no thyromegaly Lungs:    Clear to auscultation bilaterally; normal respiratory effort CV:         Regular rate and rhythm; no murmurs Abd:      + bowel sounds; soft, non-tender; no palpable masses, no distension Ext:   Left metatarsal amputation bandaged, right toe amputations Skin:      Warm and dry; no rash Neuro: alert and oriented x 3 Psych: normal mood and affect  Labs/imaging that I havepersonally reviewed  (right click and "Reselect all SmartList Selections" daily)   Labs are stable, hemoglobin 9.5   Assessment & Plan:   Acute Upper GI Bleed - felt to be due to gastric varices (no active bleeding  seen on EGD).  S/p BRTO Acute Blood Loss Anemia PPI twice daily.  Off octreotide Follow CBC  Acute respiratory failure Stable post extubation, wean down FiO2 as tolerated  Liver Cirrhosis secondary to NASH. Hx of Hepatic Encephalopathy. Supportive care  Chronic thrombocytopenia secondary to liver disease Follow labs  Left Foot Osteomyelitis. Hospitalized at [facility] 5/2-5/6, started on IV Vanc and Cefepime with goals for 6 weeks of treatment. ID consulted here who agree with treatment Continue Vanco, cefepime. Wound care  consult Will need to remove existing PICC line as tip is in the brachiocephalic Ensure new PICC line placed for access  Hx DM. SSI Holding home insulin med  Hx HTN, HLD. Restarted Coreg Hold home Furosemide, Pravastatin, Spironolactone.  Daughter updated 6/7  Best practice (right click and "Reselect all SmartList Selections" daily)  Diet:  Oral Pain/Anxiety/Delirium protocol (if indicated): No VAP protocol (if indicated): Not indicated DVT prophylaxis: Contraindicated GI prophylaxis: PPI Glucose control:  SSI Yes Central venous access:  N/A Arterial line:  N/A Foley:  N/A Mobility:  bed rest  PT consulted: Yes Last date of multidisciplinary goals of care discussion: None yet Code Status:  full code Disposition: Stable for transfer out of ICU and to hospitalist service  Signature:   Chilton Greathouse MD [place] [facility] [facility] See Amion for pager  If no response to pager , please call [phone] until 7pm After 7:00 pm call Elink  [phone] 05/25/2021, 8:55 AM

## 2021-05-25 NOTE — Progress Notes (Signed)
Peripherally Inserted Central Catheter Placement  The IV Nurse has discussed with the patient and/or persons authorized to consent for the patient, the purpose of this procedure and the potential benefits and risks involved with this procedure.  The benefits include less needle sticks, lab draws from the catheter, and the patient may be discharged home with the catheter. Risks include, but not limited to, infection, bleeding, blood clot (thrombus formation), and puncture of an artery; nerve damage and irregular heartbeat and possibility to perform a PICC exchange if needed/ordered by physician.  Alternatives to this procedure were also discussed.  Bard Power PICC patient education guide, fact sheet on infection prevention and patient information card has been provided to patient /or left at bedside.    PICC Placement Documentation  PICC Single Lumen 05/25/21 Right Basilic 36 cm 1 cm (Active)  Indication for Insertion or Continuance of Line Prolonged intravenous therapies 05/25/21 1100  Exposed Catheter (cm) 1 cm 05/25/21 1100  Site Assessment Clean;Dry;Intact 05/25/21 1100  Line Status Flushed;Saline locked;Blood return noted 05/25/21 1100  Dressing Type Transparent;Securing device 05/25/21 1100  Dressing Status Clean;Dry;Intact 05/25/21 1100  Antimicrobial disc in place? Yes 05/25/21 1100  Safety Lock Not Applicable 05/25/21 1100  Line Care Connections checked and tightened 05/25/21 1100  Dressing Intervention New dressing 05/25/21 1100  Dressing Change Due 06/01/21 05/25/21 1100       Franne Grip Renee 05/25/2021, 11:55 AM

## 2021-05-25 NOTE — Progress Notes (Signed)
eLink Physician-Brief Progress Note Patient Name: Ariel Arnold DOB: 03-Oct-1956 MRN: [id number]   Date of Service  05/25/2021  HPI/Events of Note  Patient complaining of some pain in the foot and mild abdominal pain, no ongoing GI bleeding,patient with advanced liver disease.  eICU Interventions  Oxycodone 5 mg po Q 6 hours PRN pain x 4 doses ordered.        Ariel Arnold 05/25/2021, 10:42 PM

## 2021-05-25 NOTE — Evaluation (Signed)
Physical Therapy Evaluation Patient Details Name: Ariel Arnold MRN: [id number] DOB: [birth year] Today's Date: 05/25/2021   History of Present Illness  Ariel Arnold is a 65 yo F who presented to the [facility] ED on 6/1 with complaints of hematemesis.  GI was consulted and completed a EGD on 6/2 with no ulcer or infiltrating process seen, but concern for gastric variceal bleed. VDRF 6/2-6/6.  Transferred to [facility] on 6/2.  6/3 to IR for BRTO of gastric varices. Treating for osteomyelitis with meds but may need more surgery on toes per note. Past medical history of recurrent GIB, hepatic encephalopathy, cirrhosis secondary to NASH, hepatic encephalopathy, osteomyelitis with partial amputation of the left foot.  Clinical Impression  Pt admitted with above diagnosis. Pt was able to come to EOB with min assist and PT noted that flexi seal was leaking.  Min assist to stand with HHA of 2 with pt able to stand to be cleaned and then pivoted to chair with min assist. Pt needs cast shoe for left foot as sock doesn't fit over dressing.  Ordered cast shoe for left foot.  If pts sister can provide 24 hour care, pt should be able to go home with HHPT f/u.   Pt currently with functional limitations due to the deficits listed below (see PT Problem List). Pt will benefit from skilled PT to increase their independence and safety with mobility to allow discharge to the venue listed below.      Follow Up Recommendations Home health PT;Supervision/Assistance - 24 hour (If sister cant provide 24 hour care, SNF recommended)    Equipment Recommendations  None recommended by PT (May need to clarify that pt has wheelchair and RW with sister)    Recommendations for Other Services       Precautions / Restrictions Precautions Precautions: Fall Precaution Comments: Order for cast shoe for left foot due to dressing - couldnt use sock Required Braces or Orthoses: Other Brace Other Brace: cast shoe  ordered Restrictions Weight Bearing Restrictions: No      Mobility  Bed Mobility Overal bed mobility: Needs Assistance Bed Mobility: Supine to Sit     Supine to sit: Min assist     General bed mobility comments: Needed some assist to come to eOB    Transfers Overall transfer level: Needs assistance Equipment used: 2 person hand held assist Transfers: Sit to/from [employer] Sit to Stand: Min assist Stand pivot transfers: Min assist       General transfer comment: Needed assist to stand and to pivot to chair.  Did fairly well with UE support.  Pt's flexi seal leaking therefore byt the time PT cleaned pt she only had energy to pivot to chair.  Ambulation/Gait                Stairs            Wheelchair Mobility    Modified Rankin (Stroke Patients Only)       Balance Overall balance assessment: Needs assistance Sitting-balance support: No upper extremity supported;Feet supported Sitting balance-Leahy Scale: Fair   Postural control: Posterior lean Standing balance support: Bilateral upper extremity supported;During functional activity Standing balance-Leahy Scale: Poor Standing balance comment: relies on UE support for balance as well as external support                             Pertinent Vitals/Pain Pain Assessment: Faces Faces Pain Scale: Hurts even  more Pain Location: left arm and breast Pain Descriptors / Indicators: Aching;Discomfort;Grimacing;Guarding Pain Intervention(s): Limited activity within patient's tolerance;Monitored during session;Repositioned    Home Living Family/patient expects to be discharged to:: Private residence Living Arrangements: Other relatives (sister) Available Help at Discharge: Family;Available 24 hours/day Type of Home: House Home Access: Stairs to enter Entrance Stairs-Rails: Doctor, general practice of Steps: 3 Home Layout: One level Home Equipment: Cane - single  point;Wheelchair - [employer] - 4 wheels;Walker - 2 wheels;Bedside commode;Tub bench;Hand held shower head;Grab bars - tub/shower      Prior Function Level of Independence: Needs assistance   Gait / Transfers Assistance Needed: used RW and sometimes sister walked with her  ADL's / Homemaking Assistance Needed: Sister assist with bathing/dressing, sister cooks and cleans and drives        Hand Dominance   Dominant Hand: Right    Extremity/Trunk Assessment   Upper Extremity Assessment Upper Extremity Assessment: Defer to OT evaluation    Lower Extremity Assessment Lower Extremity Assessment: LLE deficits/detail LLE: Unable to fully assess due to pain    Cervical / Trunk Assessment Cervical / Trunk Assessment: Normal  Communication   Communication: No difficulties  Cognition Arousal/Alertness: Awake/alert Behavior During Therapy: WFL for tasks assessed/performed Overall Cognitive Status: Within Functional Limits for tasks assessed                                        General Comments      Exercises General Exercises - Lower Extremity Ankle Circles/Pumps: AROM;Both;10 reps;Seated Long Arc Quad: AROM;Both;10 reps;Seated   Assessment/Plan    PT Assessment Patient needs continued PT services  PT Problem List Decreased balance;Decreased mobility;Decreased knowledge of use of DME;Decreased safety awareness;Decreased knowledge of precautions;Cardiopulmonary status limiting activity       PT Treatment Interventions DME instruction;Gait training;Functional mobility training;Therapeutic activities;Therapeutic exercise;Balance training;Stair training;Patient/family education    PT Goals (Current goals can be found in the Care Plan section)  Acute Rehab PT Goals Patient Stated Goal: to go home PT Goal Formulation: With patient Time For Goal Achievement: 06/08/21 Potential to Achieve Goals: Good    Frequency Min 3X/week   Barriers to discharge         Co-evaluation               AM-PAC PT "6 Clicks" Mobility  Outcome Measure Help needed turning from your back to your side while in a flat bed without using bedrails?: A Little Help needed moving from lying on your back to sitting on the side of a flat bed without using bedrails?: A Little Help needed moving to and from a bed to a chair (including a wheelchair)?: A Lot Help needed standing up from a chair using your arms (e.g., wheelchair or bedside chair)?: A Lot Help needed to walk in hospital room?: Total Help needed climbing 3-5 steps with a railing? : Total 6 Click Score: 12    End of Session Equipment Utilized During Treatment: Gait belt Activity Tolerance: Patient limited by fatigue Patient left: in chair;with call bell/phone within reach;with chair alarm set Nurse Communication: Mobility status PT Visit Diagnosis: Unsteadiness on feet (R26.81)    Time: 0211-1552 PT Time Calculation (min) (ACUTE ONLY): 38 min   Charges:   PT Evaluation $PT Eval Moderate Complexity: 1 Mod PT Treatments $Therapeutic Exercise: 8-22 mins $Therapeutic Activity: 8-22 mins        Shanaye Rief M,PT Acute  Rehab Services [phone] (pager)  Bevelyn Buckles 05/25/2021, 12:13 PM

## 2021-05-25 NOTE — Progress Notes (Signed)
Orthopedic Tech Progress Note Patient Details:  Ariel Arnold [birth year] [id number]  Ortho Devices Type of Ortho Device: Postop shoe/boot Ortho Device/Splint Location: LLE Ortho Device/Splint Interventions: Ordered,Application   Post Interventions Patient Tolerated: Well Instructions Provided: Care of device   Donald Pore 05/25/2021, 1:44 PM

## 2021-05-25 NOTE — Progress Notes (Signed)
eLink Physician-Brief Progress Note Patient Name: Ariel Arnold DOB: April 13, 1956 MRN: [id number]   Date of Service  05/25/2021  HPI/Events of Note  RN reports PICC looks to be partially pulled out.  Asking for order to d/c PICC  eICU Interventions  Ok to Dc PICC     Intervention Category Minor Interventions: Other:  Ranee Gosselin 05/25/2021, 6:06 AM

## 2021-05-25 NOTE — Progress Notes (Signed)
eLink Physician-Brief Progress Note Patient Name: Ariel Arnold DOB: [birth year] MRN: [id number]   Date of Service  05/25/2021  HPI/Events of Note  Has NS at 75 ml/hr infusing.  Diet ordered.  RN asking you to review for necessity of IVF  eICU Interventions  DC ed NS. Cr , lytes ok        Ranee Gosselin 05/25/2021, 12:36 AM

## 2021-05-25 NOTE — Progress Notes (Signed)
PHARMACY CONSULT NOTE FOR:  OUTPATIENT  PARENTERAL ANTIBIOTIC THERAPY (OPAT)  Indication: Left foot osteomyelitis Regimen: Vancomycin 1000 mg every 24 hours + Cefepime 2 gm IV Q 8 hours  End date: 06/02/21  IV antibiotic discharge orders are pended. To discharging provider:  please sign these orders via discharge navigator,  Select New Orders & click on the button choice - Manage This Unsigned Work.     Thank you for allowing pharmacy to be a part of this patient's care.  Sharin Mons, PharmD, BCPS, BCIDP Infectious Diseases Clinical Pharmacist Phone: [phone] 05/25/2021, 10:07 AM

## 2021-05-25 NOTE — Progress Notes (Signed)
Called to declot PICC line. Arrived to find PICC out 10 cm. CXR on 6-2 shows tip in brachiocephalic vein. Primary RN notified that PICC is not centrally placed and should be d/c'd. Awaiting orders.

## 2021-05-26 LAB — BASIC METABOLIC PANEL
Anion gap: 5 (ref 5–15)
BUN: 20 mg/dL (ref 8–23)
CO2: 18 mmol/L — ABNORMAL LOW (ref 22–32)
Calcium: 8 mg/dL — ABNORMAL LOW (ref 8.9–10.3)
Chloride: 108 mmol/L (ref 98–111)
Creatinine, Ser: 0.75 mg/dL (ref 0.44–1.00)
GFR, Estimated: >60 mL/min (ref >60)
Glucose, Bld: 224 mg/dL — ABNORMAL HIGH (ref 70–99)
Potassium: 3.6 mmol/L (ref 3.5–5.1)
Sodium: 131 mmol/L — ABNORMAL LOW (ref 135–145)

## 2021-05-26 LAB — MAGNESIUM: Magnesium: 1.6 mg/dL — ABNORMAL LOW (ref 1.7–2.4)

## 2021-05-26 LAB — GLUCOSE, CAPILLARY
Glucose-Capillary: 248 mg/dL — ABNORMAL HIGH (ref 70–99)
Glucose-Capillary: 155 mg/dL — ABNORMAL HIGH (ref 70–99)
Glucose-Capillary: 342 mg/dL — ABNORMAL HIGH (ref 70–99)
Glucose-Capillary: 274 mg/dL — ABNORMAL HIGH (ref 70–99)

## 2021-05-26 LAB — CBC
HCT: 27.3 % — ABNORMAL LOW (ref 36.0–46.0)
Hemoglobin: 9.2 g/dL — ABNORMAL LOW (ref 12.0–15.0)
MCH: 31.2 pg (ref 26.0–34.0)
MCHC: 33.7 g/dL (ref 30.0–36.0)
MCV: 92.5 fL (ref 80.0–100.0)
Platelets: 76 10*3/uL — ABNORMAL LOW (ref 150–400)
RBC: 2.95 MIL/uL — ABNORMAL LOW (ref 3.87–5.11)
RDW: 19.9 % — ABNORMAL HIGH (ref 11.5–15.5)
WBC: 4.3 10*3/uL (ref 4.0–10.5)
nRBC: 0 % (ref 0.0–0.2)

## 2021-05-26 LAB — PHOSPHORUS: Phosphorus: 1.9 mg/dL — ABNORMAL LOW (ref 2.5–4.6)

## 2021-05-26 MED ORDER — MAGNESIUM SULFATE 4 GM/100ML IV SOLN
4.0000 g | Freq: Once | INTRAVENOUS | Status: AC
  Administered 2021-05-26: 4 g via INTRAVENOUS
  Filled 2021-05-26: qty 100

## 2021-05-26 MED ORDER — CEFEPIME IV (FOR PTA / DISCHARGE USE ONLY)
2.0000 g | Freq: Three times a day (TID) | INTRAVENOUS | 0 refills | Status: AC
Start: 2021-05-26 — End: 2021-06-03

## 2021-05-26 MED ORDER — POTASSIUM CHLORIDE CRYS ER 20 MEQ PO TBCR: 40.0000 meq | EXTENDED_RELEASE_TABLET | Freq: Once | ORAL | Status: DC

## 2021-05-26 MED ORDER — POTASSIUM PHOSPHATES 15 MMOLE/5ML IV SOLN
30.0000 mmol | Freq: Once | INTRAVENOUS | Status: AC
  Administered 2021-05-26: 30 mmol via INTRAVENOUS
  Filled 2021-05-26: qty 10

## 2021-05-26 MED ORDER — VANCOMYCIN IV (FOR PTA / DISCHARGE USE ONLY): 1000.0000 mg | INTRAVENOUS | 0 refills | Status: AC

## 2021-05-26 MED ORDER — MAGNESIUM SULFATE 2 GM/50ML IV SOLN: 2.0000 g | Freq: Once | INTRAVENOUS | Status: DC

## 2021-05-26 NOTE — Progress Notes (Signed)
NAME:  Ariel Arnold, MRN:  [id number], DOB:  January 09, 1956, LOS: 6 ADMISSION DATE:  05/19/2021, CONSULTATION DATE:  6/2 REFERRING MD:  , CHIEF COMPLAINT:  hematemesis   History of Present Illness:  Per chart review, unable to obtain from patient due to clinical status   Ariel Arnold is a 65 yo F who presented to the [facility] ED on 6/1 with complaints of hematemesis  She has a past medical history of recurrent GIB, hepatic encephalopathy, cirrhosis secondary to NASH, hepatic encephalopathy, osteomyelitis with partial amputation of the left foot.   The morning of 6/1 Ariel Arnold woke up nauseous and had one reported episode of emesis with red blood. EMS was called and she was transported to the [facility] ED. It is unclear from documentation if she was having blood in stools. Her ED workup was notable for HGB of 8, PLT 62, INR of 1.7, WBC 2.7. She was started on IV octreotide, Protonix drip, and IV ceftriaxone. GI was consulted and completed a EGD on 6/2 with no ulcer or infiltrating process seen, but concern for gastric variceal bleed. She was intubated for the procedure and remained intubated post procedure. She received a total of 3u PRBC.  A transfer request to [facility] was placed for need for IR consult for treatment of gastric varix (TIPS vs embo).  PCCM was consulted for management post transfer.  Pertinent  Medical History  Recurrent GIB, hepatic encephalopathy, cirrhosis secondary to NASH, hepatic encephalopathy, osteomyelitis with partial amputation of the left foot.   Significant Hospital Events: Including procedures, antibiotic start and stop dates in addition to other pertinent events   . 6/1 Presented to [facility], Ceftriaxone>, 1 UPRBC given . 6/2 Upper EGD-No ulcer or infiltrating process seen. Concern for gastric variceal bleed. TXR to [facility] . 6/3 Underwent successful IR for BRTO of gastric varices,hemoglobin stable . 6/4 CTA showing successful embolization introduction of gastroesophageal  varices . 6/5 off octreotide, starting SBT's . 6/6 Extubated  Interim History / Subjective:  Remains hemodynamically stable.  She is receiving multiple interventions from multiple cities and states as an outpatient attempting to discharge 05/26/2021  Objective   Blood pressure (!) 136/56, pulse 77, temperature (!) 97.4 F (36.3 C), temperature source Axillary, resp. rate (!) 26, height 5\' 8"  (1.727 m), weight 79.4 kg, SpO2 97 %.        Intake/Output Summary (Last 24 hours) at 05/26/2021 1035 Last data filed at 05/26/2021 0800 Gross per 24 hour  Intake 1701.78 ml  Output 500 ml  Net 1201.78 ml   Filed Weights   05/19/21 1232  Weight: 79.4 kg    Examination: General: Elderly female no acute distress at rest HEENT: No JVD lymphadenopathy is appreciated Neuro: Grossly intact without focal defect but somewhat lackadaisical affect CV: Heart sounds are regular regular rhythm PULM: Diminished breath sounds in the bases  GI: soft, bsx4 active positive bowel sounds nontender GU: Voids Extremities: Left lower extremity is with history of old metatarsal surgical intervention with nonhealing ulcer      Skin: no rashes or lesions   Labs/imaging that I havepersonally reviewed  (right click and "Reselect all SmartList Selections" daily)   All labs have been reviewed 05/26/2021  Assessment & Plan:   Acute Upper GI Bleed - felt to be due to gastric varices (no active bleeding seen on EGD).  S/p BRTO Acute Blood Loss Anemia Recent Labs    05/25/21 0627 05/26/21 0414  HGB 9.5* 9.2*     Continue proton  pump inhibitor Hemoglobin will need follow-up as an outpatient with primary care physician  Acute respiratory failure Stable post extubation, wean down FiO2 as tolerated Status post extubation no acute issues on room air  Liver Cirrhosis secondary to NASH. Hx of Hepatic Encephalopathy. Monitor as an outpatient  Chronic thrombocytopenia secondary to liver disease Follow-up as  an outpatient  Left Foot Osteomyelitis.  Hospitalized outside hospital 5 2-5 6 and started on IV vancomycin and cefepime with goal 6 weeks of treatment.  This has been set up as an outpatient.  Continue home health IV vancomycin and cefepime for total 6-week this is set up in this outpatient system.  She needs follow-up with orthopedic surgery concerning a weeping wound left metatarsal surgery area.  She is refused surgical interventions in the past she opted for IV antibiotics and hyperbaric chamber which has been supposedly set up as an output  New PICC line has been inserted on 05/25/2021  Wound ostomy care was evaluated on 05/20/2020 recommending cleansing left foot with saline pat dry and place Aquacel dressing with 4 x 4 gauze and secure with Keflex and change daily.  She can resume her [facility] protocol once she is discharged home.    Hx DM. Resume home outpatient insulin  Hx HTN, HLD. Restarted Coreg Continue to hold furosemide pravastatin She needs a follow-up within 2 weeks with her primary care physician  Daughter updated 05/26/2021.  She notes that the granddaughter will be picking her up and assisting with her care.  This is a different location than she normally receives her home health care.  Best practice (right click and "Reselect all SmartList Selections" daily)  Diet:  Oral Pain/Anxiety/Delirium protocol (if indicated): No VAP protocol (if indicated): Not indicated DVT prophylaxis: Contraindicated GI prophylaxis: PPI Glucose control:  SSI Yes Central venous access:  Rt picc Arterial line:  N/A Foley:  N/A Mobility:  bed rest  PT consulted: Yes Last date of multidisciplinary goals of care discussion: None yet Code Status:  full code Disposition: Attempting discharge home on 05/26/2021.  She is a rather complex patient and is receiving care multiple cities and states. Case manager Vance Peper is asked to hold off on discharge until they can arrange home health home  infusions to be in the proper location as she is going home to a different locale..  Signature:   Devra Dopp ACNP Acute Care Nurse Practitioner [facility] Pulmonary/Critical Care Please consult Amion 05/26/2021, 10:35 AM

## 2021-05-26 NOTE — Progress Notes (Signed)
Phos 1.9, Mg 1.6 Replaced per protocol

## 2021-05-26 NOTE — Evaluation (Signed)
Occupational Therapy Evaluation Patient Details Name: Ariel Arnold MRN: [id number] DOB: June 10, 1956 Today's Date: 05/26/2021    History of Present Illness Ariel Arnold is a 65 yo F who presented to the [facility] on 6/1 with complaints of hematemesis.  GI was consulted and completed a EGD on 6/2 with no ulcer or infiltrating process seen, but concern for gastric variceal bleed. VDRF 6/2-6/6.  Transferred to [facility] on 6/2.  6/3 to IR for BRTO of gastric varices. Treating for osteomyelitis with meds but may need more surgery on toes per note. Past medical history of recurrent GIB, hepatic encephalopathy, cirrhosis secondary to NASH, hepatic encephalopathy, osteomyelitis with partial amputation of the left foot.   Clinical Impression   PTA patient was living with her sister in a private residence and was completing functional mobility in home with use of RW. Patient reports sister assists with ADLs/IADLs including meal prep and driving at baseline. Patient currently functioning below baseline demonstrating observed ADLs including LB dressing with Min to Mod A grossly. Patient currently completing functional transfers with Min guard and use of RW at best. Patient also limited by deficits listed below including generalized weakness and decreased activity tolernace and would benefit from continued acute OT services in prep for safe d/c home with family.      Follow Up Recommendations  Home health OT;Supervision/Assistance - 24 hour    Equipment Recommendations  None recommended by OT (Patient has necessary DME.)    Recommendations for Other Services       Precautions / Restrictions Precautions Precautions: Fall Precaution Comments: cast shoe for mobility Required Braces or Orthoses: Other Brace Other Brace: cast shoe in room Restrictions Weight Bearing Restrictions: No      Mobility Bed Mobility Overal bed mobility: Needs Assistance Bed Mobility: Supine to Sit     Supine to sit: Min  assist     General bed mobility comments: Min A to bring trunk upright with HOB elevated.    Transfers Overall transfer level: Needs assistance Equipment used: Rolling walker (2 wheeled) Transfers: Sit to/from [employer] Sit to Stand: Min guard Stand pivot transfers: Min guard       General transfer comment: Min guard for sit to stand from EOB and for stand-pivot to recliner with use of RW. Requires increased time.    Balance Overall balance assessment: Needs assistance Sitting-balance support: No upper extremity supported;Feet supported Sitting balance-Leahy Scale: Fair   Postural control: Posterior lean Standing balance support: Bilateral upper extremity supported;During functional activity Standing balance-Leahy Scale: Poor Standing balance comment: Reliant on BUE on RW.                           ADL either performed or assessed with clinical judgement   ADL Overall ADL's : Needs assistance/impaired     Grooming: Set up;Sitting               Lower Body Dressing: Moderate assistance;Sit to/from stand Lower Body Dressing Details (indicate cue type and reason): Max A to don footwear seated EOB. Able to remove soiled purewick in standing with unilateral UE support on RW. Toilet Transfer: Minimal Production designer, theatre/television/film Details (indicate cue type and reason): Simulated with transfer to recliner with Min A and use of RW. Requires increased time.                 Vision Patient Visual Report: No change from baseline  Perception     Praxis      Pertinent Vitals/Pain Pain Assessment: Faces Faces Pain Scale: Hurts a little bit Pain Location: left arm and breast Pain Descriptors / Indicators: Aching;Discomfort;Grimacing;Guarding Pain Intervention(s): Limited activity within patient's tolerance;Monitored during session;Repositioned     Hand Dominance Right   Extremity/Trunk Assessment Upper Extremity  Assessment Upper Extremity Assessment: Generalized weakness   Lower Extremity Assessment Lower Extremity Assessment: Defer to PT evaluation   Cervical / Trunk Assessment Cervical / Trunk Assessment: Normal   Communication Communication Communication: No difficulties   Cognition Arousal/Alertness: Awake/alert Behavior During Therapy: WFL for tasks assessed/performed Overall Cognitive Status: Within Functional Limits for tasks assessed                                     General Comments  VSS on RA.    Exercises     Shoulder Instructions      Home Living Family/patient expects to be discharged to:: Private residence Living Arrangements: Other relatives (sister) Available Help at Discharge: Family;Available 24 hours/day Type of Home: House Home Access: Stairs to enter [employer] of Steps: 3 Entrance Stairs-Rails: Right;Left Home Layout: One level     Bathroom Shower/Tub: Chief Strategy Officer: Standard     Home Equipment: Cane - single point;Wheelchair - [employer] - 4 wheels;Walker - 2 wheels;Bedside commode;Tub bench;Hand held shower head;Grab bars - tub/shower          Prior Functioning/Environment Level of Independence: Needs assistance  Gait / Transfers Assistance Needed: used RW and sometimes sister walked with her ADL's / Homemaking Assistance Needed: Sister assist with bathing/dressing, sister cooks and cleans and drives            OT Problem List: Decreased strength;Decreased activity tolerance;Impaired balance (sitting and/or standing);Decreased cognition;Decreased safety awareness;Decreased knowledge of use of DME or AE;Decreased knowledge of precautions      OT Treatment/Interventions: Self-care/ADL training;Therapeutic exercise;DME and/or AE instruction;Therapeutic activities;Cognitive remediation/compensation;Patient/family education;Balance training    OT Goals(Current goals can be found in the care  plan section) Acute Rehab OT Goals Patient Stated Goal: to go home OT Goal Formulation: With patient Time For Goal Achievement: 06/09/21 Potential to Achieve Goals: Good ADL Goals Pt Will Perform Grooming: with supervision;standing Pt Will Perform Upper Body Dressing: with set-up;sitting Pt Will Perform Lower Body Dressing: with supervision;with adaptive equipment;sit to/from stand Pt Will Transfer to Toilet: with supervision;ambulating Pt Will Perform Toileting - Clothing Manipulation and hygiene: with supervision;sit to/from stand Pt Will Perform Tub/Shower Transfer: with supervision;shower seat;rolling walker Pt/caregiver will Perform Home Exercise Program: Both right and left upper extremity;Increased strength;With written HEP provided;With Supervision  OT Frequency: Min 2X/week   Barriers to D/C:            Co-evaluation              AM-PAC OT "6 Clicks" Daily Activity     Outcome Measure Help from another person eating meals?: None Help from another person taking care of personal grooming?: A Little Help from another person toileting, which includes using toliet, bedpan, or urinal?: A Little Help from another person bathing (including washing, rinsing, drying)?: A Lot Help from another person to put on and taking off regular upper body clothing?: A Little Help from another person to put on and taking off regular lower body clothing?: A Lot 6 Click Score: 17   End of Session Equipment Utilized During Treatment: Gait  belt;Rolling walker;Other (comment) (cast shoe on L foot) Nurse Communication: Mobility status  Activity Tolerance: Patient tolerated treatment well Patient left: in chair;with call bell/phone within reach;with chair alarm set  OT Visit Diagnosis: Unsteadiness on feet (R26.81);Muscle weakness (generalized) (M62.81);Other abnormalities of gait and mobility (R26.89)                Time: 3151-7616 OT Time Calculation (min): 22 min Charges:  OT General  Charges $OT Visit: 1 Visit OT Evaluation $OT Eval Moderate Complexity: 1 Mod  Novi Calia H. OTR/L Supplemental OT, Department of rehab services [phone]  Fawne Hughley R H. 05/26/2021, 1:06 PM

## 2021-05-26 NOTE — Discharge Summary (Signed)
Physician Discharge Summary  Patient ID: Ariel Arnold MRN: [id number] DOB/AGE: 1956/08/13 65 y.o.  Admit date: 05/19/2021 Discharge date: 05/26/2021  Problem List Principal Problem:   Acute GI bleeding Active Problems:   Liver cirrhosis secondary to NASH (HCC)   Hepatic encephalopathy (HCC)   Thrombocytopenia (HCC)   Acute osteomyelitis of left foot (HCC)   Encephalopathy   Hematemesis   Pressure injury of skin  HPI: Per chart review, unable to obtain from patient due to clinical status   Ariel Arnold is a 65 yo F who presented to the [facility] on 6/1 with complaints of hematemesis  She has a past medical history of recurrent GIB, hepatic encephalopathy, cirrhosis secondary to NASH, hepatic encephalopathy, osteomyelitis with partial amputation of the left foot.   The morning of 6/1 [name] woke up nauseous and had one reported episode of emesis with red blood. EMS was called and she was transported to the [facility] ED. It is unclear from documentation if she was having blood in stools. Her ED workup was notable for HGB of 8, PLT 62, INR of 1.7, WBC 2.7. She was started on IV octreotide, Protonix drip, and IV ceftriaxone. GI was consulted and completed a EGD on 6/2 with no ulcer or infiltrating process seen, but concern for gastric variceal bleed. She was intubated for the procedure and remained intubated post procedure. She received a total of 3u PRBC.  A transfer request to [facility] was placed for need for IR consult for treatment of gastric varix (TIPS vs embo).  PCCM was consulted for management post transfer. Hospital Course:  Pertinent  Medical History  Recurrent GIB, hepatic encephalopathy, cirrhosis secondary to NASH, hepatic encephalopathy, osteomyelitis with partial amputation of the left foot.   Significant Hospital Events: Including procedures, antibiotic start and stop dates in addition to other pertinent events    6/1 Presented to [facility], Ceftriaxone>, 1 UPRBC given  6/2  Upper EGD-No ulcer or infiltrating process seen. Concern for gastric variceal bleed. TXR to [facility] [place]  6/3 Underwent successful IR for BRTO of gastric varices,hemoglobin stable  6/4 CTA showing successful embolization introduction of gastroesophageal varices  6/5 off octreotide, starting SBT's  6/6 Extubated  Interim History / Subjective:  Remains hemodynamically stable.  She is receiving multiple interventions from multiple cities and states as an outpatient attempting to discharge 05/26/2021  Objective   Blood pressure (!) 136/56, pulse 77, temperature (!) 97.4 F (36.3 C), temperature source Axillary, resp. rate (!) 26, height $RemoveBe'5\' 8"'WuTnBzKRO$  (1.727 m), weight 79.4 kg, SpO2 97 %.    >        Intake/Output Summary (Last 24 hours) at 05/26/2021 1035 Last data filed at 05/26/2021 0800    Gross per 24 hour  Intake 1701.78 ml  Output 500 ml  Net 1201.78 ml    Examination: General: Elderly female no acute distress at rest HEENT: No JVD lymphadenopathy is appreciated Neuro: Grossly intact without focal defect but somewhat lackadaisical affect CV: Heart sounds are regular regular rhythm PULM: Diminished breath sounds in the bases  GI: soft, bsx4 active positive bowel sounds nontender GU: Voids Extremities: Left lower extremity is with history of old metatarsal surgical intervention with nonhealing ulcer      Skin: no rashes or lesions  Assessment & Plan:   Acute Upper GI Bleed - felt to be due to gastric varices (no active bleeding seen on EGD).  S/p BRTO. She will need follow-up with her own gastroenterologist in the near future no further  bleeding has been noted.  Hemodynamically stable and ready for discharge.  Acute Blood Loss Anemia Recent Labs (last 2 labs)       Recent Labs    05/25/21 0627 05/26/21 0414  HGB 9.5* 9.2*       Continue proton pump inhibitor Hemoglobin will need follow-up as an outpatient with primary care physician  Acute respiratory  failure Stable post extubation, wean down FiO2 as tolerated Status post extubation no acute issues on room air  Liver Cirrhosis secondary to NASH. Hx of Hepatic Encephalopathy. Monitor as an outpatient  Chronic thrombocytopenia secondary to liver disease Follow-up as an outpatient  Left Foot Osteomyelitis.  Hospitalized outside hospital 5 2-5 6 and started on IV vancomycin and cefepime with goal 6 weeks of treatment.  This has been set up as an outpatient.  Continue home health IV vancomycin and cefepime for total 6-week this is set up in this outpatient system.  She needs follow-up with orthopedic surgery concerning a weeping wound left metatarsal surgery area.  She is refused surgical interventions in the past she opted for IV antibiotics and hyperbaric chamber which has been supposedly set up as an output  New PICC line has been inserted on 05/25/2021  Wound ostomy care was evaluated on 05/20/2020 recommending cleansing left foot with saline pat dry and place Aquacel dressing with 4 x 4 gauze and secure with Keflex and change daily.  She can resume her [facility] protocol once she is discharged home.    Hx DM. Resume home outpatient insulin  Hx HTN, HLD. Restarted Coreg Continue to hold furosemide pravastatin She needs a follow-up within 2 weeks with her primary care physician  Daughter updated 05/26/2021.  She notes that the granddaughter will be picking her up and assisting with her care.  This is a different location than she normally receives her home health care.   Labs/imaging that I havepersonally reviewed  (right click and "Reselect all SmartList Selections" daily)   All labs have been reviewed 05/26/2021    Labs at discharge Lab Results  Component Value Date   CREATININE 0.75 05/26/2021   BUN 20 05/26/2021   NA 131 (L) 05/26/2021   K 3.6 05/26/2021   CL 108 05/26/2021   CO2 18 (L) 05/26/2021   Lab Results  Component Value Date   WBC 4.3 05/26/2021    HGB 9.2 (L) 05/26/2021   HCT 27.3 (L) 05/26/2021   MCV 92.5 05/26/2021   PLT 76 (L) 05/26/2021   Lab Results  Component Value Date   ALT 27 05/25/2021   AST 39 05/25/2021   ALKPHOS 96 05/25/2021   BILITOT 1.5 (H) 05/25/2021   Lab Results  Component Value Date   INR 1.6 (H) 05/20/2021   INR 1.6 (H) 05/20/2021   INR 1.7 (H) 05/19/2021    Current radiology studies DG CHEST PORT 1 VIEW  Result Date: 05/25/2021 CLINICAL DATA:  Acute respiratory failure, shortness of breath, history hypertension, cirrhosis, type II diabetes mellitus EXAM: PORTABLE CHEST 1 VIEW COMPARISON:  Portable exam 0917 hours compared to 05/20/2021 FINDINGS: Enlargement of cardiac silhouette with pulmonary vascular congestion. Atherosclerotic calcification aorta. Perihilar infiltrates question pulmonary edema. Bibasilar atelectasis. No pleural effusion or pneumothorax. Embolization coils in the LEFT upper quadrant. IMPRESSION: Enlargement of cardiac silhouette with vascular congestion and question mild pulmonary edema. Additional bibasilar atelectasis. Electronically Signed   By: Lavonia Dana M.D.   On: 05/25/2021 10:58   [place] EKG SITE RITE  Result Date: 05/25/2021 If Site [employer]  not attached, placement could not be confirmed due to current cardiac rhythm.   Disposition:    Discharge Instructions    [facility] pharmacist to adjust dose for Vancomycin, Aminoglycosides and other anti-infective therapies as requested by physician.   Complete by: As directed    [facility] to provide Cath Flo 2mg    Complete by: As directed    Administer for PICC line occlusion and as ordered by physician for other access device issues.   Anaphylaxis Kit: Provided to treat any anaphylactic reaction to the medication being provided to the patient if First Dose or when requested by physician   Complete by: As directed    Epinephrine 1mg /ml vial / amp: Administer 0.3mg  (0.46ml) subcutaneously once for moderate to  severe anaphylaxis, nurse to call physician and pharmacy when reaction occurs and call 911 if needed for immediate care   Diphenhydramine 50mg /ml IV vial: Administer 25-50mg  IV/IM PRN for first dose reaction, rash, itching, mild reaction, nurse to call physician and pharmacy when reaction occurs   Sodium Chloride 0.9% NS 5109ml IV: Administer if needed for hypovolemic blood pressure drop or as ordered by physician after call to physician with anaphylactic reaction   Change dressing on IV access line weekly and PRN   Complete by: As directed    Flush IV access with Sodium Chloride 0.9% and Heparin 10 units/ml or 100 units/ml   Complete by: As directed    Home infusion instructions - [facility]   Complete by: As directed    Instructions: Flush IV access with Sodium Chloride 0.9% and Heparin 10units/ml or 100units/ml   Change dressing on IV access line: Weekly and PRN   Instructions Cath Flo 2mg : Administer for PICC Line occlusion and as ordered by physician for other access device   [facility] pharmacist to adjust dose for: Vancomycin, Aminoglycosides and other anti-infective therapies as requested by physician   Method of administration may be changed at the discretion of home infusion pharmacist based upon assessment of the patient and/or caregiver's ability to self-administer the medication ordered   Complete by: As directed      Allergies as of 05/26/2021      Reactions   Chlorhexidine Rash      Medication List    STOP taking these medications   furosemide 20 MG tablet Commonly known as: LASIX   gabapentin 800 MG tablet Commonly known as: NEURONTIN   lactulose 10 GM/15ML solution Commonly known as: CHRONULAC   metFORMIN 1000 MG tablet Commonly known as: GLUCOPHAGE   NovoLOG FlexPen 100 UNIT/ML FlexPen Generic drug: insulin aspart   pravastatin 20 MG tablet Commonly known as: PRAVACHOL   spironolactone 50 MG tablet Commonly known as: ALDACTONE    Toujeo Max SoloStar 300 UNIT/ML Solostar Pen Generic drug: insulin glargine (2 Unit Dial)   vancomycin 10 G Solr injection Commonly known as: VANCOCIN Replaced by: vancomycin  IVPB     TAKE these medications   albuterol 108 (90 Base) MCG/ACT inhaler Commonly known as: VENTOLIN HFA Inhale 2 puffs into the lungs every 4 (four) hours as needed.   benzonatate 100 MG capsule Commonly known as: TESSALON Take 100 mg by mouth 3 (three) times daily as needed.   carvedilol 3.125 MG tablet Commonly known as: COREG Take 3.125 mg by mouth 2 (two) times daily.   ceFEPime  IVPB Commonly known as: MAXIPIME Inject 2 g into the vein every 8 (eight) hours for 8 days. Indication:  Left foot osteomyelitis  First Dose: Yes Last Day of Therapy:  06/02/21 Labs - Once weekly:  CBC/D and BMP, Labs - Every other week:  ESR and CRP Method of administration: IV Push Method of administration may be changed at the discretion of home infusion pharmacist based upon assessment of the patient and/or caregiver's ability to self-administer the medication ordered.   clotrimazole-betamethasone cream Commonly known as: LOTRISONE Apply topically 2 (two) times daily.   hydrOXYzine 25 MG tablet Commonly known as: ATARAX/VISTARIL Take 25 mg by mouth 3 (three) times daily.   pantoprazole 40 MG tablet Commonly known as: PROTONIX Take 1 tablet by mouth 2 (two) times daily.   sertraline 100 MG tablet Commonly known as: ZOLOFT Take 1.5 tablets by mouth daily.   vancomycin  IVPB Inject 1,000 mg into the vein daily for 8 days. Indication:  Left foot osteomyelitis  First Dose: Yes Last Day of Therapy:  06/02/21 Labs - $Remo"Sunday/Monday:  CBC/D, BMP, and vancomycin trough. Labs - Thursday:  BMP and vancomycin trough Labs - Every other week:  ESR and CRP Method of administration:Elastomeric Method of administration may be changed at the discretion of the patient and/or caregiver's ability to self-administer the  medication ordered. Replaces: vancomycin 10 G Solr injection   Vitamin D (Ergocalciferol) 1.25 MG (50000 UNIT) Caps capsule Commonly known as: DRISDOL Take 1 capsule by mouth once a week.            Discharge Care Instructions  (From admission, onward)         Start     Ordered   05/26/21 0000  Change dressing on IV access line weekly and PRN  (Home infusion instructions - [facility] )        06"pzbws$ /08/22 1122            Discharged Condition: poor  Time spent on discharge 40 minutes.  Vital signs at Discharge. Temp:  [97.4 F (36.3 C)-98.1 F (36.7 C)] 97.4 F (36.3 C) (06/08 0832) Pulse Rate:  [65-87] 77 (06/08 1000) Resp:  [18-29] 26 (06/08 1000) BP: (85-143)/(44-102) 136/56 (06/08 1000) SpO2:  [79 %-100 %] 97 % (06/08 1000) Office follow up Special Information or instructions.  Needs to follow-up with her home health support care to when she gets back to [place].  Case managers and pharmacist of arrange for home health infusion at time of discharge.   Signed: Richardson Landry Werner Labella ACNP Acute Care Nurse Practitioner [place] Please consult Amion 05/26/2021, 11:25 AM

## 2021-05-26 NOTE — TOC Transition Note (Signed)
Transition of Care [facility]) - CM/SW Discharge Note   Patient Details  Name: LONDA MACKOWSKI MRN: [id number] Date of Birth: 1956/08/12  [facility] of [facility]) CM/SW Contact:  Durenda Guthrie, RN Phone Number: 05/26/2021, 11:17 AM   Clinical Narrative:   Patient is active with [facility] for IV infusion, [facility] provides RN. CM confirmed with Jeri Modena that patient will have care resumed. CM spoke with patient's daughter, [name] [phone] who said that her mother will be going to stay with her grandson while patient's sister is away for a few days, expected to return on Sunday. Coralee Rud resides at  [address], [place], [place]. Case Manager updated Jeri Modena with this information.     Final next level of care: Home w Home Health Services     Patient Goals and CMS Choice        Discharge Placement                       Discharge Plan and Services   Discharge Planning Services: CM Consult Post Acute Care Choice: Resumption of Svcs/PTA Provider          DME Arranged: N/A DME Agency: NA       HH Arranged: RN,IV Antibiotics HH Agency: [facility] Date HH Agency Contacted: 05/26/21 Time HH Agency Contacted: 1000 Representative spoke with at [facility] Agency: Jeri Modena  Social Determinants of Health (SDOH) Interventions     Readmission Risk Interventions No flowsheet data found.

## 2021-05-26 NOTE — Progress Notes (Signed)
Pt discharged. Discharge instructions were given to pt's granddaughter. Pt stable at discharge. Home Health IV antibiotic instructions given by Jeri Modena, Infusion RN.

## 2021-05-27 ENCOUNTER — Encounter (HOSPITAL_COMMUNITY): Payer: Self-pay | Admitting: Internal Medicine

## 2021-06-15 ENCOUNTER — Encounter: Payer: Self-pay | Admitting: *Deleted

## 2021-06-16 ENCOUNTER — Encounter: Payer: Self-pay | Admitting: Cardiology

## 2021-06-16 ENCOUNTER — Ambulatory Visit: Payer: Medicaid - Out of State | Admitting: Cardiology

## 2021-06-16 VITALS — BP 118/68 | HR 72 | Ht 68.0 in | Wt 177.0 lb

## 2021-06-16 DIAGNOSIS — R0789 Other chest pain: Secondary | ICD-10-CM

## 2021-06-16 DIAGNOSIS — R778 Other specified abnormalities of plasma proteins: Secondary | ICD-10-CM | POA: Diagnosis not present

## 2021-06-16 MED ORDER — CARVEDILOL 6.25 MG PO TABS
6.2500 mg | ORAL_TABLET | Freq: Two times a day (BID) | ORAL | 3 refills | Status: DC
Start: 2021-06-16 — End: 2021-12-17

## 2021-06-16 NOTE — Patient Instructions (Addendum)
Medication Instructions:  Your physician has recommended you make the following change in your medication:  Increase carvedilol to 6.25 mg by mouth twice daily Continue other medications the same  Labwork: none  Testing/Procedures: none  Follow-Up: Your physician recommends that you schedule a follow-up appointment in: 6 months  Any Other Special Instructions Will Be Listed Below (If Applicable).  If you need a refill on your cardiac medications before your next appointment, please call your pharmacy.

## 2021-06-16 NOTE — Progress Notes (Signed)
Clinical Summary Ariel Arnold is a 65 y.o.female seen today as a new consult, referred by Dr Michel Santee for the following medical problems.   Elevated troponin/Chest pain - trop elevated during 02/2021 admission with GI bleed, peak 306 - 02/2021 [facility] echo normal LVEF 55-60%, no WMAs  - occasional chest pains with exertion. Throbbing pain midchest radiates into both shoulders, can after activity. Was previously daily, now symptoms less often. Can occur with feeling stress. Lasts about 1.5 minutes, no other associated symptoms. Not positional     2. GI bleeding - admit 05/2021 [facility] concern for variceal bleeding, had embolization. Admission Hgb was 6.7 - transfused 3 units prbcs - similar admission 02/2021 to [facility]    3. Cirrhosis secondary to NASH - prior issues with hepatic encephalopathy  4. Pancytopenia - chronic. Last labs 06/02/21 WBC 2.8 Hgb 8.2 Plt 71   5..Osteomyelitis with partial amputation of the left foot.  Past Medical History:  Diagnosis Date   Anemia    Aortic atherosclerosis (HCC)    AVM (arteriovenous malformation) of colon    Essential hypertension    GI bleeding    Recurrent   Hepatic encephalopathy (HCC)    History of RSV infection    Liver cirrhosis secondary to NASH (HCC) 2004   Biopsy-proven   Obesity    Osteomyelitis (HCC)    Thrombocytopenia (HCC)    Type 2 diabetes mellitus (HCC)      Allergies  Allergen Reactions   Chlorhexidine Rash     Current Outpatient Medications  Medication Sig Dispense Refill   albuterol (VENTOLIN HFA) 108 (90 Base) MCG/ACT inhaler Inhale 2 puffs into the lungs every 4 (four) hours as needed.     benzonatate (TESSALON) 100 MG capsule Take 100 mg by mouth 3 (three) times daily as needed.     carvedilol (COREG) 3.125 MG tablet Take 3.125 mg by mouth 2 (two) times daily.     clotrimazole-betamethasone (LOTRISONE) cream Apply topically 2 (two) times daily.     hydrOXYzine (ATARAX/VISTARIL) 25 MG tablet Take  25 mg by mouth 3 (three) times daily.     pantoprazole (PROTONIX) 40 MG tablet Take 1 tablet by mouth 2 (two) times daily.     sertraline (ZOLOFT) 100 MG tablet Take 1.5 tablets by mouth daily.     Vitamin D, Ergocalciferol, (DRISDOL) 1.25 MG (50000 UNIT) CAPS capsule Take 1 capsule by mouth once a week.     No current facility-administered medications for this visit.     Past Surgical History:  Procedure Laterality Date   ABDOMINAL HYSTERECTOMY     COLONOSCOPY     Cystoscopy with ureteral stent     ESOPHAGOGASTRODUODENOSCOPY     ESOPHAGOGASTRODUODENOSCOPY (EGD) WITH PROPOFOL N/A 05/20/2021   Procedure: ESOPHAGOGASTRODUODENOSCOPY (EGD) WITH PROPOFOL;  Surgeon: Corbin Ade, MD;  Location: [facility]ITE;  Service: Endoscopy;  Laterality: N/A;  needs intubation   IR ANGIOGRAM FOLLOW UP STUDY  05/21/2021   IR ANGIOGRAM SELECTIVE EACH ADDITIONAL VESSEL  05/21/2021   IR EMBO VENOUS NOT HEMORR HEMANG  INC GUIDE ROADMAPPING  05/21/2021   IR US GUIDE VASC ACCESS RIGHT  05/21/2021   IR VENOGRAM RENAL UNI LEFT  05/21/2021   RADIOLOGY WITH ANESTHESIA N/A 05/21/2021   Procedure: IR WITH ANESTHESIA;  Surgeon: Radiologist, Medication, MD;  Location: [facility] OR;  Service: Radiology;  Laterality: N/A;   Toe amputations Bilateral    TUBAL LIGATION       Allergies  Allergen Reactions  Chlorhexidine Rash      Family History  Problem Relation Age of Onset   Heart failure Mother    Hyperlipidemia Mother    Diabetes Mellitus II Mother    Heart failure Father    Hyperlipidemia Father      Social History Ms. Pelto reports that she has quit smoking. Her smoking use included cigarettes. She has never used smokeless tobacco. Ms. Briones reports current alcohol use.   Review of Systems CONSTITUTIONAL: No weight loss, fever, chills, weakness or fatigue.  HEENT: Eyes: No visual loss, blurred vision, double vision or yellow sclerae.No hearing loss, sneezing, congestion, runny nose or sore throat.  SKIN: No  rash or itching.  CARDIOVASCULAR: per hpi RESPIRATORY: No shortness of breath, cough or sputum.  GASTROINTESTINAL: No anorexia, nausea, vomiting or diarrhea. No abdominal pain or blood.  GENITOURINARY: No burning on urination, no polyuria NEUROLOGICAL: No headache, dizziness, syncope, paralysis, ataxia, numbness or tingling in the extremities. No change in bowel or bladder control.  MUSCULOSKELETAL: No muscle, back pain, joint pain or stiffness.  LYMPHATICS: No enlarged nodes. No history of splenectomy.  PSYCHIATRIC: No history of depression or anxiety.  ENDOCRINOLOGIC: No reports of sweating, cold or heat intolerance. No polyuria or polydipsia.  Marland Kitchen   Physical Examination Today's Vitals   06/16/21 1332  BP: 118/68  Pulse: 72  SpO2: 98%  Weight: 177 lb (80.3 kg)  Height: 5\' 8"  (1.727 m)   Body mass index is 26.91 kg/m.  Gen: resting comfortably, no acute distress HEENT: no scleral icterus, pupils equal round and reactive, no palptable cervical adenopathy,  CV: RRR, no m/r/g, no jvd Resp: Clear to auscultation bilaterally GI: abdomen is soft, non-tender, non-distended, normal bowel sounds, no hepatosplenomegaly MSK: extremities are warm, no edema.  Skin: warm, no rash Neuro:  no focal deficits Psych: appropriate affect   Diagnostic Studies  02/2021 echo [facility] Left Ventricle  Normal left ventricular size. Wall thickness is normal. EF: 55-60%. Wall motion is normal. Doppler parameters indicate normal diastolic function.   Right Ventricle  Right ventricle is normal. Systolic function is normal.   Left Atrium  Left atrium is normal in size.   Right Atrium  Normal sized right atrium.   Mitral Valve  There is mild regurgitation.   Tricuspid Valve  Tricuspid valve structure is normal. There is trace regurgitation. The right ventricular systolic pressure is normal (<36 mmHg).   Aortic Valve  The aortic valve is tricuspid. The leaflets are not thickened and exhibit  normal excursion. There is no regurgitation or stenosis.   Pulmonic Valve  The pulmonic valve was not well visualized. Trace regurgitation.   Ascending Aorta  The aortic root is normal in size.   Pericardium  There is no pericardial effusion.     Assessment and Plan  1.Elevated troponin/Chest pain - likely secondary to severe anemia at time of prior admission. Unclear if may have some component of chronic CAD - would not be a candidate for stress testing as she would not be a candidate for cath since cannot take antiplatelet agents due to history of bleeding esophageal varices, chronic anemia, and thrombocytopenia - mild chest pains at times, can try increaseing coreg to see if helps symptoms.       03/2021, M.D.

## 2021-09-09 ENCOUNTER — Ambulatory Visit (INDEPENDENT_AMBULATORY_CARE_PROVIDER_SITE_OTHER): Payer: Medicare Other

## 2021-09-09 ENCOUNTER — Ambulatory Visit: Payer: Medicare Other | Admitting: Podiatry

## 2021-09-09 ENCOUNTER — Other Ambulatory Visit: Payer: Self-pay

## 2021-09-09 DIAGNOSIS — M86172 Other acute osteomyelitis, left ankle and foot: Secondary | ICD-10-CM

## 2021-09-09 DIAGNOSIS — L97522 Non-pressure chronic ulcer of other part of left foot with fat layer exposed: Secondary | ICD-10-CM

## 2021-09-15 NOTE — Progress Notes (Signed)
Subjective:   Patient ID: Ariel Arnold, female   DOB: 65 y.o.   MRN: [id number]   HPI 65 year old female presents the office today for concerns of a wound of the left foot.  She has a history of a transmetatarsal amputation but 3 years ago she developed a wound on the left foot.  She previously has been in the hospital for this and she has MRI performed which revealed residual osteomyelitis and she is currently on IV antibiotics for this.  She also said that she previously Which Did Heal with Wound However That Opened Back up.  She Currently Denies Any Drainage or Pus.  No Fevers or Chills.  No Other Concerns.   ROS: Negative otherwise    Objective:  Physical Exam  General: AAO x3, NAD  Dermatological: Ulcerations noted on the plantar aspect of the medial foot underneath the first metatarsal remnant measuring 1.8 x 1.5 x 0.5 cm without any surrounding erythema, ascending cellulitis.  There is no fluctuation or crepitation.  There is no malodor.  Vascular: Dorsalis Pedis artery and Posterior Tibial artery pedal pulses are palpable 1/4 bilateral with immedate capillary fill time.There is no pain with calf compression, swelling, warmth, erythema.   Neruologic: Sensation decreased with Phoebe Perch monofilament  Musculoskeletal: TMA present. Muscular strength 5/5 in all groups tested bilateral.  Gait: Unassisted, Nonantalgic.       Assessment:   Left foot ulceration with residual osteomyelitis     Plan:  -Treatment options discussed including all alternatives, risks, and complications -Etiology of symptoms were discussed -X-rays obtained and reviewed.  There is change of the distal metatarsal remnants likely acute versus chronic osteomyelitis. -At this point she has been treated and she is currently on IV antibiotics.  She has been hospitalized previously and recently for this.  I discussed with her amputation but she does not want proceed with this.  Likely going to a total  contact cast next appointment.  Continue antibiotics.  She is to monitor very closely for any signs or symptoms of infection.  If any were to occur she is to report directly back to the emergency department.  Vivi Barrack DPM

## 2021-09-20 ENCOUNTER — Ambulatory Visit (INDEPENDENT_AMBULATORY_CARE_PROVIDER_SITE_OTHER): Payer: Medicare Other | Admitting: Podiatry

## 2021-09-20 ENCOUNTER — Encounter: Payer: Self-pay | Admitting: Podiatry

## 2021-09-20 ENCOUNTER — Other Ambulatory Visit: Payer: Self-pay

## 2021-09-20 DIAGNOSIS — Z89432 Acquired absence of left foot: Secondary | ICD-10-CM

## 2021-09-20 DIAGNOSIS — L97522 Non-pressure chronic ulcer of other part of left foot with fat layer exposed: Secondary | ICD-10-CM

## 2021-09-20 DIAGNOSIS — M86172 Other acute osteomyelitis, left ankle and foot: Secondary | ICD-10-CM

## 2021-09-20 NOTE — Patient Instructions (Addendum)
Change bandage to the foot every day. You can clean the foot with soap and water and dry very well. Do not soak your foot. Apply a small amount of maxorb dressing then cover with 4x4, kerlix, ACE bandage. Wear surgical shoe. Stay off of the foot as much as possible.   Monitor for any signs/symptoms of infection. Call the office immediately if any occur or go directly to the emergency room. Call with any questions/concerns.

## 2021-09-22 NOTE — Progress Notes (Signed)
Subjective: 65 year old female presents the office today for follow-up evaluation of left foot wound.  She is continue with the IV antibiotics that she was discharged from hospital with.  She change the bandages every couple days she has noticed bloody drainage on the bandage which is increased.  She does not see any increasing swelling or redness or any pus coming from the wound.  She has no fevers or chills today.  Per care everywhere:  -She is on vancomycin and Cipro.  Vascular studies: ABI's [11/18/2020] (R) 1.24 (L) 1.33 (-) DVT [10/14/2020]  Imaging: XR (L) Foot 08/27/2021  FINDINGS: Prior forefoot amputations. There is a soft tissue ulcer along the medial distal stump. There is lucency and irregularity of the residual distal first metatarsal. There is diffuse soft tissue swelling of the foot.   IMPRESSION: Findings are concerning for osteomyelitis of the residual first metatarsal with adjacent soft tissue ulcer. MRI (L) Foot W\Wo Contrast 08/27/2021 1532  IMPRESSION: 1. Soft tissue ulceration at the medial aspect of the distal stump overlying the residual first metatarsal. Acute osteomyelitis throughout the residual first metatarsal extending to the first metatarsal base. 2. Marrow edema throughout the midfoot which is most pronounced within the navicular and cuboid. Subtle patchy intermediate T1 signal changes at these locations without discrete erosion or marrow replacement. Findings are favored to represent reactive osteitis. Early acute osteomyelitis is not excluded. 3. No organized or drainable fluid collections.   Previous treatment: IV antibiotics, dressing changes, biologic dressings (dermagraft), the patient was referred for HBO therapy (January 2022) but did not follow through with treatment. The patient's treatment options remain limited at this time. She is tolerating dressing changes well she was referred to [facility] wound care center but was unable to  proceed with HBO secondary to insurance conflicts.   Objective: AAO x3, NAD DP/PT pulses palpable bilaterally, CRT less than 3 seconds Ulceration plantar medial aspect of the left foot is worsened compared to last appointment today measures 2 x 1.5 x 1 cm.  There is mild surrounding erythema without any ascending cellulitis.  No fluctuance or crepitation.  There is no malodor.  Hyperkeratotic periwound.  Prior to wound debridement the wound was measuring 1.8 x 1.5 x 0.5 cm.  There is no probing to bone although it is closed today.  No purulence.  No No pain with calf compression, swelling, warmth, erythema     Assessment: Ulceration left foot, nonhealing  Plan: Today she presented with right total contact cast for concerns about the Increased Drainage.  I Sharply Debrided the Wound Today Utilizing a #312 with Scalpel As Well As a Tissue Nipper down to Healthy, Bleeding, Granular Tissue.  This Is to the Level of the Fat Layer.  There Was Minimal Blood Loss and Hemostasis Was Achieved through Manual Compression.  Hemostasis Achieved.  Dressing Was Applied with Maxorb Followed by 4 X 4's, Kerlix. -At this point I discussed with surgical debridement possible graft application of the wound.  I plan to do this in the next week however she has an upcoming vacation planned to get the beach and she has not been changed this vacation.  I will plan to this when she gets back.  Discussed with her however she is to monitor this very closely and if there is any signs or symptoms of infection to report directly to emergency department.  I advised her not to get in the water or stand and she is to keep the foot elevated. -The incision placement as  well as the postoperative course was discussed with the patient. I discussed risks of the surgery which include, but not limited to, infection, bleeding, pain, swelling, need for further surgery, delayed or nonhealing, painful or ugly scar, numbness or sensation changes,  over/under correction, recurrence, transfer lesions, further deformity, DVT/PE, loss of toe/foot. Patient understands these risks and wishes to proceed with surgery. The surgical consent was reviewed with the patient all 3 pages were signed. No promises or guarantees were given to the outcome of the procedure. All questions were answered to the best of my ability. Before the surgery the patient was encouraged to call the office if there is any further questions. The surgery will be performed at [facility] on an outpatient basis. She understands she is at very high risk of limb loss.   Vivi Barrack DPM

## 2021-09-27 ENCOUNTER — Telehealth: Payer: Self-pay | Admitting: Urology

## 2021-09-27 NOTE — Telephone Encounter (Signed)
DOS - 10/20/21  WOUND DEBRIDEMENT LEFT --- 11043 SKIN GRAFT LEFT --- 15275  [employer] EFFECTIVE DATE - 04/18/21  PLAN DEDUCTIBLE - $0.00  OUT OF POCKET - $4,900.00 W/ $4,900.00 REMAINING COINSURANCE - 0% COPAY - $250.00   PER [employer] WEBSITE FOR CPT CODES 71696 AND 15275 Notification or Prior Authorization is not required for the requested services  Decision ID #:[id number]

## 2021-09-29 NOTE — Progress Notes (Signed)
Please enter orders for PAT visit scheduled for 10-11-21.

## 2021-09-30 ENCOUNTER — Ambulatory Visit: Payer: Medicare Other | Admitting: Podiatry

## 2021-10-04 ENCOUNTER — Other Ambulatory Visit (HOSPITAL_COMMUNITY): Payer: Medicare Other

## 2021-10-06 NOTE — Progress Notes (Addendum)
Anesthesia Review:  PCP: DR Sherrin Daisy in [facility] Cardiologist : DR Wayne Sever 06/16/21 LOV  Chest x-ray :05/25/21- 1V  EKG :04/01/21  Echo :06/29/21  Stress test: Cardiac Cath :  Activity level: cannot do a flight of stairs iwthout difficulty  Sleep Study/ CPAP : none  Fasting Blood Sugar :      / Checks Blood Sugar -- times a day:   Blood Thinner/ Instructions /Last Dose: ASA / Instructions/ Last Dose :   Dm- type 2  Checks glucose two times daily lat home  Hgba1c-10/14/21-  Pt forgot preop appt.  Called and unable to leave voice mail because mailboxo full  Called and spoke with daughter [name] and set up phone appt for 300pm on 10/12/2021.  Called pt and medical hx andpreop instructions completed via phone.  Lab appt set up for 10/14/21 at 100pm.   Called pharmacy andgave them phone number to call pt for updated med list.

## 2021-10-06 NOTE — Progress Notes (Addendum)
Ariel Arnold  10/06/2021   Your procedure is scheduled on:        10/20/2021   Report to [facility] Main  Entrance   Report to admitting at    1130AM     Call this number if you have problems the morning of surgery [phone]    Remember: Do not eat food , candy gum or mints :After Midnight. You may have clear liquids from midnight until __ 1045am    CLEAR LIQUID DIET   Foods Allowed                                                                       Coffee and tea, regular and decaf                              Plain Jell-O any favor except red or purple                                            Fruit ices (not with fruit pulp)                                      Iced Popsicles                                     Carbonated beverages, regular and diet                                    Cranberry, grape and apple juices Sports drinks like Gatorade Lightly seasoned clear broth or consume(fat free) Sugar   _____________________________________________________________________    BRUSH YOUR TEETH MORNING OF SURGERY AND RINSE YOUR MOUTH OUT, NO CHEWING GUM CANDY OR MINTS.     Take these medicines the morning of surgery with A SIP OF WATER:  Coreg, Protonix, Zoloft, Imdur, Gabapentin, Eye drops as usual, Inhaler if needed and bring,  Novolog Insulin- No bedtime dose nite before surgery. Toujeo- Take 1/2 dose of Toujeo nite before surgery.   Take 1/2 dose of Toujeo am of surgery.      DO NOT TAKE ANY DIABETIC MEDICATIONS DAY OF YOUR SURGERY                               You may not have any metal on your body including hair pins and              piercings  Do not wear jewelry, make-up, lotions, powders or perfumes, deodorant             Do not wear nail polish on your fingernails.  Do not shave  48 hours prior to surgery.  Men may shave face and neck.   Do not bring valuables to the hospital. [facility] IS NOT              RESPONSIBLE   FOR VALUABLES.  Contacts, dentures or bridgework may not be worn into surgery.  Leave suitcase in the car. After surgery it may be brought to your room.     Patients discharged the day of surgery will not be allowed to drive home. IF YOU ARE HAVING SURGERY AND GOING HOME THE SAME DAY, YOU MUST HAVE AN ADULT TO DRIVE YOU HOME AND BE WITH YOU FOR 24 HOURS. YOU MAY GO HOME BY TAXI OR UBER OR ORTHERWISE, BUT AN ADULT MUST ACCOMPANY YOU HOME AND STAY WITH YOU FOR 24 HOURS.  Name and phone number of your driver:  Special Instructions: N/A              Please read over the following fact sheets you were given: _____________________________________________________________________  [facility] - Preparing for Surgery- Take sponge bath or shower with DIAL SOAP nite before and am of surgery.  Do not use on left foot with wound.   Before surgery, you can play an important role.  Because skin is not sterile, your skin needs to be as free of germs as possible.  You can reduce the number of germs on your skin by washing with CHG (chlorahexidine gluconate) soap before surgery.  CHG is an antiseptic cleaner which kills germs and bonds with the skin to continue killing germs even after washing. Please DO NOT use if you have an allergy to CHG or antibacterial soaps.  If your skin becomes reddened/irritated stop using the CHG and inform your nurse when you arrive at Short Stay. Do not shave (including legs and underarms) for at least 48 hours prior to the first CHG shower.  You may shave your face/neck. Please follow these instructions carefully:  1.  Shower with CHG Soap the night before surgery and the  morning of Surgery.  2.  If you choose to wash your hair, wash your hair first as usual with your  normal  shampoo.  3.  After you shampoo, rinse your hair and body thoroughly to remove the  shampoo.                           4.  Use CHG as you would any other liquid soap.  You can apply chg directly   to the skin and wash                       Gently with a scrungie or clean washcloth.  5.  Apply the CHG Soap to your body ONLY FROM THE NECK DOWN.   Do not use on face/ open                           Wound or open sores. Avoid contact with eyes, ears mouth and genitals (private parts).                       Wash face,  Genitals (private parts) with your normal soap.             6.  Wash thoroughly, paying special attention to the area where your surgery  will be performed.  7.  Thoroughly rinse your body with warm water from the neck  down.  8.  DO NOT shower/wash with your normal soap after using and rinsing off  the CHG Soap.                9.  Pat yourself dry with a clean towel.            10.  Wear clean pajamas.            11.  Place clean sheets on your bed the night of your first shower and do not  sleep with pets. Day of Surgery : Do not apply any lotions/deodorants the morning of surgery.  Please wear clean clothes to the hospital/surgery center.  FAILURE TO FOLLOW THESE INSTRUCTIONS MAY RESULT IN THE CANCELLATION OF YOUR SURGERY PATIENT SIGNATURE_________________________________  NURSE SIGNATURE__________________________________  ________________________________________________________________________

## 2021-10-11 ENCOUNTER — Encounter (HOSPITAL_COMMUNITY): Payer: Self-pay | Admitting: Podiatry

## 2021-10-11 ENCOUNTER — Encounter (HOSPITAL_COMMUNITY)
Admission: RE | Admit: 2021-10-11 | Discharge: 2021-10-11 | Disposition: A | Discharge status: Home / Self Care | Payer: Medicare Other | Source: Ambulatory Visit | Attending: Podiatry | Admitting: Podiatry

## 2021-10-11 ENCOUNTER — Encounter (INDEPENDENT_AMBULATORY_CARE_PROVIDER_SITE_OTHER): Payer: Self-pay

## 2021-10-11 ENCOUNTER — Other Ambulatory Visit: Payer: Self-pay

## 2021-10-11 DIAGNOSIS — I10 Essential (primary) hypertension: Secondary | ICD-10-CM | POA: Diagnosis not present

## 2021-10-11 DIAGNOSIS — E1122 Type 2 diabetes mellitus with diabetic chronic kidney disease: Secondary | ICD-10-CM

## 2021-10-11 DIAGNOSIS — E119 Type 2 diabetes mellitus without complications: Secondary | ICD-10-CM | POA: Insufficient documentation

## 2021-10-11 DIAGNOSIS — K7460 Unspecified cirrhosis of liver: Secondary | ICD-10-CM | POA: Diagnosis not present

## 2021-10-11 DIAGNOSIS — M1990 Unspecified osteoarthritis, unspecified site: Secondary | ICD-10-CM

## 2021-10-11 DIAGNOSIS — Z794 Long term (current) use of insulin: Secondary | ICD-10-CM | POA: Diagnosis not present

## 2021-10-11 DIAGNOSIS — N182 Chronic kidney disease, stage 2 (mild): Secondary | ICD-10-CM

## 2021-10-11 DIAGNOSIS — Z01818 Encounter for other preprocedural examination: Secondary | ICD-10-CM | POA: Diagnosis not present

## 2021-10-11 DIAGNOSIS — K7581 Nonalcoholic steatohepatitis (NASH): Secondary | ICD-10-CM | POA: Insufficient documentation

## 2021-10-11 DIAGNOSIS — D696 Thrombocytopenia, unspecified: Secondary | ICD-10-CM

## 2021-10-11 DIAGNOSIS — K922 Gastrointestinal hemorrhage, unspecified: Secondary | ICD-10-CM

## 2021-10-12 ENCOUNTER — Encounter: Payer: Self-pay | Admitting: Podiatry

## 2021-10-14 ENCOUNTER — Encounter (HOSPITAL_COMMUNITY): Payer: Medicare Other

## 2021-10-18 ENCOUNTER — Inpatient Hospital Stay (HOSPITAL_COMMUNITY): Admission: RE | Admit: 2021-10-18 | Payer: Medicare Other | Source: Ambulatory Visit

## 2021-10-19 NOTE — Progress Notes (Addendum)
Called pt this afternoon because she was still on surgery scheduler for 10/20/2021 after surgery scheduler at office stated she was not cleared for surgery.  PT under the impression that surgery has been delayed due to medical issues and so does daughter after speaking with daughter on 1031/22. Attempted to call daughter x 2 with no answer.   Called and [facility] for Surgery Scheduler today at approximately 12 noon.  Called and left another message at 330pm..  made Christeen Douglas, PA aware,  Shanda Bumps sent secure chart to surgery scheduler at office of DR Ardelle Anton.  Surgery cancelled.  Called and spoke with pt .  PT aware surgery postponed for now due to medical issues.    PT stated she has lost her glucose meter.  Instructed pt that she should call either her pharmacist or her MD and let them be aware and have them assist her in obtaining a new glucose meter.  PT voiced understanding.

## 2021-10-20 ENCOUNTER — Encounter (HOSPITAL_COMMUNITY): Admission: RE | Payer: Self-pay | Source: Home / Self Care

## 2021-10-20 ENCOUNTER — Ambulatory Visit (HOSPITAL_COMMUNITY): Admission: RE | Admit: 2021-10-20 | Payer: Medicare Other | Source: Home / Self Care | Admitting: Podiatry

## 2021-10-20 ENCOUNTER — Telehealth: Payer: Self-pay

## 2021-10-20 HISTORY — DX: Depression, unspecified: F32.A

## 2021-10-20 HISTORY — DX: Personal history of urinary calculi: Z87.442

## 2021-10-20 HISTORY — DX: Peripheral vascular disease, unspecified: I73.9

## 2021-10-20 HISTORY — DX: Anxiety disorder, unspecified: F41.9

## 2021-10-20 HISTORY — DX: Unspecified osteoarthritis, unspecified site: M19.90

## 2021-10-20 SURGERY — IRRIGATION AND DEBRIDEMENT FOOT
Anesthesia: Choice | Laterality: Left

## 2021-10-20 NOTE — Telephone Encounter (Signed)
Ariel Arnold's surgery with Dr. Ardelle Anton was canceled for 10/20/2021. Waiting on clearance from PCP and Endo before proceeding with surgery. I left message for [name] to call out office and schedule an office visit with Dr. Ardelle Anton, per his request.

## 2021-10-25 ENCOUNTER — Encounter: Payer: Medicare Other | Admitting: Podiatry

## 2021-11-04 ENCOUNTER — Encounter: Payer: Medicare Other | Admitting: Podiatry

## 2021-11-09 ENCOUNTER — Inpatient Hospital Stay (HOSPITAL_COMMUNITY)
Admission: EM | Admit: 2021-11-09 | Discharge: 2021-11-17 | DRG: 629 | Disposition: A | Discharge status: Home Health | Payer: Medicare Other | Attending: Internal Medicine | Admitting: Internal Medicine

## 2021-11-09 ENCOUNTER — Encounter (HOSPITAL_COMMUNITY): Payer: Self-pay

## 2021-11-09 ENCOUNTER — Encounter: Payer: Self-pay | Admitting: Podiatry

## 2021-11-09 ENCOUNTER — Ambulatory Visit (INDEPENDENT_AMBULATORY_CARE_PROVIDER_SITE_OTHER): Payer: Medicare Other

## 2021-11-09 ENCOUNTER — Other Ambulatory Visit: Payer: Self-pay

## 2021-11-09 ENCOUNTER — Ambulatory Visit (INDEPENDENT_AMBULATORY_CARE_PROVIDER_SITE_OTHER): Payer: Medicare Other | Admitting: Podiatry

## 2021-11-09 VITALS — Temp 98.0°F

## 2021-11-09 DIAGNOSIS — K7460 Unspecified cirrhosis of liver: Secondary | ICD-10-CM | POA: Diagnosis present

## 2021-11-09 DIAGNOSIS — R739 Hyperglycemia, unspecified: Secondary | ICD-10-CM

## 2021-11-09 DIAGNOSIS — M86172 Other acute osteomyelitis, left ankle and foot: Secondary | ICD-10-CM

## 2021-11-09 DIAGNOSIS — F32A Depression, unspecified: Secondary | ICD-10-CM | POA: Diagnosis present

## 2021-11-09 DIAGNOSIS — Z79899 Other long term (current) drug therapy: Secondary | ICD-10-CM | POA: Diagnosis not present

## 2021-11-09 DIAGNOSIS — E119 Type 2 diabetes mellitus without complications: Secondary | ICD-10-CM

## 2021-11-09 DIAGNOSIS — B9562 Methicillin resistant Staphylococcus aureus infection as the cause of diseases classified elsewhere: Secondary | ICD-10-CM | POA: Diagnosis present

## 2021-11-09 DIAGNOSIS — F419 Anxiety disorder, unspecified: Secondary | ICD-10-CM | POA: Diagnosis present

## 2021-11-09 DIAGNOSIS — E871 Hypo-osmolality and hyponatremia: Secondary | ICD-10-CM | POA: Diagnosis present

## 2021-11-09 DIAGNOSIS — I10 Essential (primary) hypertension: Secondary | ICD-10-CM | POA: Diagnosis present

## 2021-11-09 DIAGNOSIS — E1165 Type 2 diabetes mellitus with hyperglycemia: Secondary | ICD-10-CM | POA: Diagnosis present

## 2021-11-09 DIAGNOSIS — Z9114 Patient's other noncompliance with medication regimen: Secondary | ICD-10-CM

## 2021-11-09 DIAGNOSIS — E1152 Type 2 diabetes mellitus with diabetic peripheral angiopathy with gangrene: Secondary | ICD-10-CM | POA: Diagnosis present

## 2021-11-09 DIAGNOSIS — Z89432 Acquired absence of left foot: Secondary | ICD-10-CM | POA: Diagnosis not present

## 2021-11-09 DIAGNOSIS — E11621 Type 2 diabetes mellitus with foot ulcer: Secondary | ICD-10-CM | POA: Diagnosis present

## 2021-11-09 DIAGNOSIS — L97522 Non-pressure chronic ulcer of other part of left foot with fat layer exposed: Secondary | ICD-10-CM

## 2021-11-09 DIAGNOSIS — Z8249 Family history of ischemic heart disease and other diseases of the circulatory system: Secondary | ICD-10-CM | POA: Diagnosis not present

## 2021-11-09 DIAGNOSIS — E785 Hyperlipidemia, unspecified: Secondary | ICD-10-CM | POA: Diagnosis present

## 2021-11-09 DIAGNOSIS — L97529 Non-pressure chronic ulcer of other part of left foot with unspecified severity: Secondary | ICD-10-CM | POA: Diagnosis present

## 2021-11-09 DIAGNOSIS — Z90710 Acquired absence of both cervix and uterus: Secondary | ICD-10-CM

## 2021-11-09 DIAGNOSIS — Z20822 Contact with and (suspected) exposure to covid-19: Secondary | ICD-10-CM | POA: Diagnosis present

## 2021-11-09 DIAGNOSIS — E11628 Type 2 diabetes mellitus with other skin complications: Secondary | ICD-10-CM | POA: Diagnosis present

## 2021-11-09 DIAGNOSIS — M86672 Other chronic osteomyelitis, left ankle and foot: Secondary | ICD-10-CM | POA: Diagnosis present

## 2021-11-09 DIAGNOSIS — L03032 Cellulitis of left toe: Secondary | ICD-10-CM | POA: Diagnosis present

## 2021-11-09 DIAGNOSIS — D509 Iron deficiency anemia, unspecified: Secondary | ICD-10-CM | POA: Diagnosis present

## 2021-11-09 DIAGNOSIS — M86272 Subacute osteomyelitis, left ankle and foot: Secondary | ICD-10-CM | POA: Diagnosis present

## 2021-11-09 DIAGNOSIS — K7581 Nonalcoholic steatohepatitis (NASH): Secondary | ICD-10-CM | POA: Diagnosis present

## 2021-11-09 DIAGNOSIS — Z7984 Long term (current) use of oral hypoglycemic drugs: Secondary | ICD-10-CM

## 2021-11-09 DIAGNOSIS — T148XXA Other injury of unspecified body region, initial encounter: Secondary | ICD-10-CM

## 2021-11-09 DIAGNOSIS — T383X6A Underdosing of insulin and oral hypoglycemic [antidiabetic] drugs, initial encounter: Secondary | ICD-10-CM | POA: Diagnosis present

## 2021-11-09 DIAGNOSIS — Z833 Family history of diabetes mellitus: Secondary | ICD-10-CM | POA: Diagnosis not present

## 2021-11-09 DIAGNOSIS — I96 Gangrene, not elsewhere classified: Secondary | ICD-10-CM | POA: Diagnosis present

## 2021-11-09 DIAGNOSIS — E875 Hyperkalemia: Secondary | ICD-10-CM | POA: Diagnosis present

## 2021-11-09 DIAGNOSIS — E1169 Type 2 diabetes mellitus with other specified complication: Principal | ICD-10-CM | POA: Diagnosis present

## 2021-11-09 DIAGNOSIS — L89312 Pressure ulcer of right buttock, stage 2: Secondary | ICD-10-CM | POA: Diagnosis present

## 2021-11-09 DIAGNOSIS — Z87891 Personal history of nicotine dependence: Secondary | ICD-10-CM | POA: Diagnosis not present

## 2021-11-09 DIAGNOSIS — D61818 Other pancytopenia: Secondary | ICD-10-CM | POA: Diagnosis present

## 2021-11-09 DIAGNOSIS — Z794 Long term (current) use of insulin: Secondary | ICD-10-CM | POA: Diagnosis not present

## 2021-11-09 DIAGNOSIS — L089 Local infection of the skin and subcutaneous tissue, unspecified: Secondary | ICD-10-CM

## 2021-11-09 LAB — CBC WITH DIFFERENTIAL/PLATELET
Abs Immature Granulocytes: 0.01 10*3/uL (ref 0.00–0.07)
Basophils Absolute: 0 10*3/uL (ref 0.0–0.1)
Basophils Relative: 0 %
Eosinophils Absolute: 0.1 10*3/uL (ref 0.0–0.5)
Eosinophils Relative: 5 %
HCT: 27.2 % — ABNORMAL LOW (ref 36.0–46.0)
Hemoglobin: 8.5 g/dL — ABNORMAL LOW (ref 12.0–15.0)
Immature Granulocytes: 1 %
Lymphocytes Relative: 37 %
Lymphs Abs: 0.6 10*3/uL — ABNORMAL LOW (ref 0.7–4.0)
MCH: 29.3 pg (ref 26.0–34.0)
MCHC: 31.3 g/dL (ref 30.0–36.0)
MCV: 93.8 fL (ref 80.0–100.0)
Monocytes Absolute: 0.1 10*3/uL (ref 0.1–1.0)
Monocytes Relative: 8 %
Neutro Abs: 0.8 10*3/uL — ABNORMAL LOW (ref 1.7–7.7)
Neutrophils Relative %: 49 %
Platelets: 57 10*3/uL — ABNORMAL LOW (ref 150–400)
RBC: 2.9 MIL/uL — ABNORMAL LOW (ref 3.87–5.11)
RDW: 14 % (ref 11.5–15.5)
WBC: 1.5 10*3/uL — ABNORMAL LOW (ref 4.0–10.5)
nRBC: 0 % (ref 0.0–0.2)

## 2021-11-09 LAB — COMPREHENSIVE METABOLIC PANEL
ALT: 59 U/L — ABNORMAL HIGH (ref 0–44)
AST: 86 U/L — ABNORMAL HIGH (ref 15–41)
Albumin: 3.3 g/dL — ABNORMAL LOW (ref 3.5–5.0)
Alkaline Phosphatase: 124 U/L (ref 38–126)
Anion gap: 9 (ref 5–15)
BUN: 19 mg/dL (ref 8–23)
CO2: 24 mmol/L (ref 22–32)
Calcium: 8.8 mg/dL — ABNORMAL LOW (ref 8.9–10.3)
Chloride: 101 mmol/L (ref 98–111)
Creatinine, Ser: 0.89 mg/dL (ref 0.44–1.00)
GFR, Estimated: >60 mL/min (ref >60)
Glucose, Bld: 605 mg/dL (ref 70–99)
Potassium: 3.9 mmol/L (ref 3.5–5.1)
Sodium: 134 mmol/L — ABNORMAL LOW (ref 135–145)
Total Bilirubin: 0.9 mg/dL (ref 0.3–1.2)
Total Protein: 7 g/dL (ref 6.5–8.1)

## 2021-11-09 LAB — RESP PANEL BY RT-PCR (FLU A&B, COVID) ARPGX2
Influenza A by PCR: NEGATIVE
Influenza B by PCR: NEGATIVE
SARS Coronavirus 2 by RT PCR: NEGATIVE

## 2021-11-09 LAB — SEDIMENTATION RATE: Sed Rate: 21 mm/hr (ref 0–22)

## 2021-11-09 LAB — CBG MONITORING, ED
Glucose-Capillary: 396 mg/dL — ABNORMAL HIGH (ref 70–99)
Glucose-Capillary: 484 mg/dL — ABNORMAL HIGH (ref 70–99)

## 2021-11-09 MED ORDER — VANCOMYCIN HCL 2000 MG/400ML IV SOLN
2000.0000 mg | Freq: Once | INTRAVENOUS | Status: AC
  Administered 2021-11-09: 2000 mg via INTRAVENOUS
  Filled 2021-11-09: qty 400

## 2021-11-09 MED ORDER — VANCOMYCIN HCL 1500 MG/300ML IV SOLN
1500.0000 mg | INTRAVENOUS | Status: DC
  Administered 2021-11-10 – 2021-11-15 (×6): 1500 mg via INTRAVENOUS
  Filled 2021-11-09 (×6): qty 300

## 2021-11-09 MED ORDER — METFORMIN HCL 500 MG PO TABS
500.0000 mg | ORAL_TABLET | Freq: Once | ORAL | Status: AC
  Administered 2021-11-09: 500 mg via ORAL
  Filled 2021-11-09: qty 1

## 2021-11-09 MED ORDER — INSULIN ASPART 100 UNIT/ML IJ SOLN
10.0000 [IU] | Freq: Once | INTRAMUSCULAR | Status: AC
  Administered 2021-11-09: 10 [IU] via INTRAVENOUS
  Filled 2021-11-09: qty 0.1

## 2021-11-09 NOTE — ED Notes (Signed)
Consult call placed for call back to [facility] MD from [facility] [phone] Ventura Sellers MD is covering this evening for Ovid Curd MD

## 2021-11-09 NOTE — ED Notes (Signed)
ED TO INPATIENT HANDOFF REPORT  ED Nurse Name and Phone #: [phone], Lenoria Farrier ,  RN  S Name/Age/Gender Ariel Arnold 65 y.o. female Room/Bed: WA21/WA21  Code Status   Code Status: Prior  Home/SNF/Other Home Patient oriented to: self, place, time, and situation Is this baseline? Yes   Triage Complete: Triage complete  Chief Complaint Chronic osteomyelitis involving ankle and foot, left ([place]) [id number]  Triage Note Pt reports with left foot wound and states that it is infected. Pt states that she just finished abts about 2-3 weeks ago.    Allergies Allergies  Allergen Reactions   Chlorhexidine Rash   Tape Rash    Level of Care/Admitting Diagnosis ED Disposition     ED Disposition  Admit   Condition  --   Comment  Hospital Area: [place] [[id number]]  Level of Care: Med-Surg [16]  May admit patient to [facility] or [facility] if equivalent level of care is available:: Yes  Covid Evaluation: Asymptomatic Screening Protocol (No Symptoms)  Diagnosis: Chronic osteomyelitis involving ankle and foot, left [facility]) [id number]  Admitting Physician: Eben Burow [id number]  Attending Physician: Eben Burow [id number]  Estimated length of stay: past midnight tomorrow  Certification:: I certify this patient will need inpatient services for at least 2 midnights          B Medical/Surgery History Past Medical History:  Diagnosis Date   Anemia    Anxiety    Aortic atherosclerosis ([place])    Arthritis    AVM (arteriovenous malformation) of colon    Depression    Essential hypertension    GI bleeding    Recurrent   Hepatic encephalopathy    History of kidney stones    History of RSV infection    Liver cirrhosis secondary to NASH ([place]) 2004   Biopsy-proven   Obesity    Osteomyelitis ([place])    Peripheral vascular disease ([place])    Thrombocytopenia ([place])    Type 2 diabetes mellitus ([place])    Past Surgical History:  Procedure  Laterality Date   ABDOMINAL HYSTERECTOMY     COLONOSCOPY     Cystoscopy with ureteral stent     ESOPHAGOGASTRODUODENOSCOPY     ESOPHAGOGASTRODUODENOSCOPY (EGD) WITH PROPOFOL N/A 05/20/2021   Procedure: ESOPHAGOGASTRODUODENOSCOPY (EGD) WITH PROPOFOL;  Surgeon: Daneil Dolin, MD;  [facility];  Service: Endoscopy;  Laterality: N/A;  needs intubation   IR ANGIOGRAM FOLLOW UP STUDY  05/21/2021   IR ANGIOGRAM SELECTIVE EACH ADDITIONAL VESSEL  05/21/2021   IR EMBO VENOUS NOT HEMORR HEMANG  INC GUIDE ROADMAPPING  05/21/2021   IR US GUIDE VASC ACCESS RIGHT  05/21/2021   IR VENOGRAM RENAL UNI LEFT  05/21/2021   RADIOLOGY WITH ANESTHESIA N/A 05/21/2021   Procedure: IR WITH ANESTHESIA;  Surgeon: Radiologist, Medication, MD;  Location: [place];  Service: Radiology;  Laterality: N/A;   Toe amputations Bilateral    TUBAL LIGATION       A IV Location/Drains/Wounds Patient Lines/Drains/Airways Status     Active Line/Drains/Airways     Name Placement date Placement time Site Days   Peripheral IV 11/09/21 20 G 1" Left Antecubital 11/09/21  2104  Antecubital  less than 1   PICC Single Lumen [id number] Right Basilic 36 cm 1 cm [id number]  [id number]  Basilic  [id number]   External Urinary Catheter 05/25/21  1105  --  168   Pressure Injury 05/20/21 Buttocks Right Stage 2 -  Partial thickness loss of dermis  presenting as a shallow open injury with a red, pink wound bed without slough. 05/20/21  --  -- 173   Wound / Incision (Open or Dehisced) 05/20/21 Non-pressure wound Foot Left;Lower 05/20/21  1840  Foot  173            Intake/Output Last 24 hours No intake or output data in the 24 hours ending 11/09/21 2209  Labs/Imaging Results for orders placed or performed during the hospital encounter of 11/09/21 (from the past 48 hour(s))  CBC with Differential/Platelet     Status: Abnormal   Collection Time: 11/09/21  8:13 PM  Result Value Ref Range   WBC 1.5 (L) 4.0 - 10.5 K/uL   RBC 2.90 (L) 3.87 - 5.11 MIL/uL    Hemoglobin 8.5 (L) 12.0 - 15.0 g/dL   HCT 27.2 (L) 36.0 - 46.0 %   MCV 93.8 80.0 - 100.0 fL   MCH 29.3 26.0 - 34.0 pg   MCHC 31.3 30.0 - 36.0 g/dL   RDW 14.0 11.5 - 15.5 %   Platelets 57 (L) 150 - 400 K/uL   nRBC 0.0 0.0 - 0.2 %   Neutrophils Relative % 49 %   Neutro Abs 0.8 (L) 1.7 - 7.7 K/uL   Lymphocytes Relative 37 %   Lymphs Abs 0.6 (L) 0.7 - 4.0 K/uL   Monocytes Relative 8 %   Monocytes Absolute 0.1 0.1 - 1.0 K/uL   Eosinophils Relative 5 %   Eosinophils Absolute 0.1 0.0 - 0.5 K/uL   Basophils Relative 0 %   Basophils Absolute 0.0 0.0 - 0.1 K/uL   Immature Granulocytes 1 %   Abs Immature Granulocytes 0.01 0.00 - 0.07 K/uL   Reactive, Benign Lymphocytes PRESENT     Comment: Performed at [facility], [place] [address], [place], [place] [address]  Comprehensive metabolic panel     Status: Abnormal   Collection Time: 11/09/21  8:13 PM  Result Value Ref Range   Sodium 134 (L) 135 - 145 mmol/L   Potassium 3.9 3.5 - 5.1 mmol/L   Chloride 101 98 - 111 mmol/L   CO2 24 22 - 32 mmol/L   Glucose, Bld 605 (HH) 70 - 99 mg/dL    Comment: Glucose reference range applies only to samples taken after fasting for at least 8 hours. CRITICAL RESULT CALLED TO, READ BACK BY AND VERIFIED WITH:  JENEAN NASH RN 11/09/21 @ 2127 VS    BUN 19 8 - 23 mg/dL   Creatinine, Ser 0.89 0.44 - 1.00 mg/dL   Calcium 8.8 (L) 8.9 - 10.3 mg/dL   Total Protein 7.0 6.5 - 8.1 g/dL   Albumin 3.3 (L) 3.5 - 5.0 g/dL   AST 86 (H) 15 - 41 U/L   ALT 59 (H) 0 - 44 U/L   Alkaline Phosphatase 124 38 - 126 U/L   Total Bilirubin 0.9 0.3 - 1.2 mg/dL   GFR, Estimated >60 >60 mL/min    Comment: (NOTE) Calculated using the CKD-EPI Creatinine Equation (2021)    Anion gap 9 5 - 15    Comment: Performed at [facility], [place] [address] [place], [place] [address]  Resp Panel by RT-PCR (Flu A&B, Covid) Nasopharyngeal Swab     Status: None   Collection Time: 11/09/21  9:19 PM   Specimen:  Nasopharyngeal Swab; Nasopharyngeal(NP) swabs in vial transport medium  Result Value Ref Range   SARS Coronavirus 2 by RT PCR NEGATIVE NEGATIVE    Comment: (NOTE) SARS-CoV-2 target  nucleic acids are NOT DETECTED.  The SARS-CoV-2 RNA is generally detectable in upper respiratory specimens during the acute phase of infection. The lowest concentration of SARS-CoV-2 viral copies this assay can detect is 138 copies/mL. A negative result does not preclude SARS-Cov-2 infection and should not be used as the sole basis for treatment or other patient management decisions. A negative result may occur with  improper specimen collection/handling, submission of specimen other than nasopharyngeal swab, presence of viral mutation(s) within the areas targeted by this assay, and inadequate number of viral copies(<138 copies/mL). A negative result must be combined with clinical observations, patient history, and epidemiological information. The expected result is Negative.  Fact Sheet for Patients:  BloggerCourse.com  Fact Sheet for Healthcare Providers:  SeriousBroker.it  This test is no t yet approved or cleared by the [place] FDA and  has been authorized for detection and/or diagnosis of SARS-CoV-2 by FDA under an Emergency Use Authorization (EUA). This EUA will remain  in effect (meaning this test can be used) for the duration of the COVID-19 declaration under Section 564(b)(1) of the Act, 21 U.S.C.section 360bbb-3(b)(1), unless the authorization is terminated  or revoked sooner.       Influenza A by PCR NEGATIVE NEGATIVE   Influenza B by PCR NEGATIVE NEGATIVE    Comment: (NOTE) The Xpert Xpress SARS-CoV-2/FLU/RSV plus assay is intended as an aid in the diagnosis of influenza from Nasopharyngeal swab specimens and should not be used as a sole basis for treatment. Nasal washings and aspirates are unacceptable for Xpert Xpress  SARS-CoV-2/FLU/RSV testing.  Fact Sheet for Patients: BloggerCourse.com  Fact Sheet for Healthcare Providers: SeriousBroker.it  This test is not yet approved or cleared by the [place] FDA and has been authorized for detection and/or diagnosis of SARS-CoV-2 by FDA under an Emergency Use Authorization (EUA). This EUA will remain in effect (meaning this test can be used) for the duration of the COVID-19 declaration under Section 564(b)(1) of the Act, 21 U.S.C. section 360bbb-3(b)(1), unless the authorization is terminated or revoked.  Performed at [facility], [address], [place], [place] [address]    DG Foot Complete Left  Result Date: 11/09/2021 Please see detailed radiograph report in office note.   Pending Labs Unresulted Labs (From admission, onward)     Start     Ordered   11/09/21 2013  Sedimentation rate  ONCE - STAT,   STAT        11/09/21 2012   11/09/21 2013  C-reactive protein  Once,   STAT        11/09/21 2012   11/09/21 2013  Pathologist smear review  Once,   R        11/09/21 2013            Vitals/Pain Today's Vitals   11/09/21 1944 11/09/21 1945 11/09/21 2102  BP:  (!) 149/63 (!) 127/54  Pulse:  86 91  Resp:  16 18  Temp:  97.7 F (36.5 C)   TempSrc:  Oral   SpO2:  100% 97%  Weight:  86.2 kg   Height:  5\' 8"  (1.727 m)   PainSc: 0-No pain      Isolation Precautions No active isolations  Medications Medications  vancomycin (VANCOREADY) IVPB 2000 mg/400 mL (has no administration in time range)  insulin aspart (novoLOG) injection 10 Units (10 Units Intravenous Given 11/09/21 2203)  metFORMIN (GLUCOPHAGE) tablet 500 mg (500 mg Oral Given 11/09/21 2204)    Mobility walks with  device Moderate fall risk   Focused Assessments    R Recommendations: See Admitting Provider Note  Report given to:   Additional Notes:

## 2021-11-09 NOTE — Progress Notes (Signed)
Pharmacy Antibiotic Note  Ariel Arnold is a 65 y.o. female admitted on 11/09/2021 with  osteomyelitis .  Pharmacy has been consulted for Vancomyin dosing.  Plan: Vancomycin 1500mg  IV q24h to target AUC 400-550 Check Vancomycin levels at steady state Monitor renal function and cx data   Height: 5\' 8"  (172.7 cm) Weight: 86.2 kg (190 lb) IBW/kg (Calculated) : 63.9  Temp (24hrs), Avg:97.8 F (36.6 C), Min:97.7 F (36.5 C), Max:98 F (36.7 C)  Recent Labs  Lab 11/09/21 2013  WBC 1.5*  CREATININE 0.89    Estimated Creatinine Clearance: 70.5 mL/min (by C-G formula based on SCr of 0.89 mg/dL).    Allergies  Allergen Reactions   Chlorhexidine Rash   Tape Rash    Antimicrobials this admission: 11/22 Vancomycin >>   Dose adjustments this admission:  Microbiology results:   Thank you for allowing pharmacy to be a part of this patient's care.  2014 PharmD 11/09/2021 11:33 PM

## 2021-11-09 NOTE — ED Triage Notes (Signed)
Pt reports with left foot wound and states that it is infected. Pt states that she just finished abts about 2-3 weeks ago.

## 2021-11-09 NOTE — Progress Notes (Signed)
A consult was received from an ED physician for Vancomycin per pharmacy dosing.  The patient's profile has been reviewed for ht/wt/allergies/indication/available labs.   A one time order has been placed for Vancomycin 2gm IV x1.  Further antibiotics/pharmacy consults should be ordered by admitting physician if indicated.                       Thank you, Junita Push PharmD 11/09/2021  9:54 PM

## 2021-11-09 NOTE — H&P (Signed)
History and Physical    Ariel Arnold BWI:[id number] DOB: Nov 21, 1956 DOA: 11/09/2021  PCP: Jacqualine Code, DO   Patient coming from: Home  Chief Complaint: Left foot swelling with redness and drainage  HPI: Ariel Arnold is a 65 y.o. female with medical history significant for poorly controlled diabetes, recurrent osteomyelitis of the left foot status post amputation and ongoing wound, HTN, NASH, PVD, AVMs with prior GI bleeding, pancytopenia, depression who presents for evaluation of left foot swelling with redness and drainage.  She was seen earlier in the day by her podiatrist, Dr. Earleen Newport, been following her foot infection and wound.  She has had multiple rounds of antibiotics for osteomyelitis and finished IV vancomycin 2 to 3 weeks ago.  She was continued on oral Cipro last week but is having worsening redness, swelling and drainage from the foot.  She has chronic wound on the lateral aspect of her left foot.  She has had all the toes amputated on the left foot.  She denies any fever or chills.  She states she has not had any shortness of breath, abdominal pain nausea vomiting or diarrhea.  He does state that she has felt depressed.  She states she ran out of her insulin over a week ago so has not been using it and is waiting for the insurance to approve her insulin at the pharmacy for her to pick up.  She is unsure of how high her sugar levels have been in the last week.  He does have a history of poor compliance with her diabetic regimen.  Podiatry recommended admission with IV antibiotics and they will follow while in the hospital and decide if she will need further debridement versus amputation.  Patient is a history of smoking but states she quit over 20 years ago.  She states she rarely ever drinks alcohol and slightly alcohol in the last few months.  She denies illicit drug use.   ED Course: Ms. Hurtado has been hemodynamically stable in the emergency room.  Found to have  hyperglycemia but is not in DKA.  She has chronic pancytopenia. WBC 1,500 hemoglobin 8.5 hematocrit 27.2 platelets 57,000 sodium 134 potassium 3.9 chloride 101 bicarb 24 creatinine 0.9 BUN 19 glucose 605 Alk phos 124 AST 86 ALT 59 Covid negative Influenza A and B are negative.  Hospitalist service asked to admit for further management  Review of Systems:  General: Denies fever, chills, weight loss, night sweats.  Denies dizziness.  Denies change in appetite HENT: Denies head trauma, headache, denies change in hearing, tinnitus.  Denies nasal congestion or bleeding. Denies sore throat. Denies difficulty swallowing Eyes: Denies blurry vision, pain in eye, drainage.  Denies discoloration of eyes. Neck: Denies pain.  Denies swelling.  Denies pain with movement. Cardiovascular: Denies chest pain, palpitations.  Denies edema.  Denies orthopnea Respiratory: Denies shortness of breath, cough.  Denies wheezing.  Denies sputum production Gastrointestinal: Denies abdominal pain, swelling.  Denies nausea, vomiting, diarrhea.  Denies melena.  Denies hematemesis. Musculoskeletal: Left foot swelling and redness with drainage. Denies limitation of movement.  Denies deformity or swelling. Denies arthralgias or myalgias. Genitourinary: Denies pelvic pain.  Denies urinary frequency or hesitancy.  Denies dysuria.  Skin: Denies rash.  Denies petechiae, purpura, ecchymosis. Neurological: Denies syncope.  Denies seizure activity. Denies slurred speech, drooping face.  Denies visual change. Psychiatric: Denies anxiety. Denies hallucinations.  Past Medical History:  Diagnosis Date   Anemia    Anxiety    Aortic atherosclerosis ([place])  Arthritis    AVM (arteriovenous malformation) of colon    Depression    Essential hypertension    GI bleeding    Recurrent   Hepatic encephalopathy    History of kidney stones    History of RSV infection    Liver cirrhosis secondary to NASH ([place]) 2004   Biopsy-proven   Obesity     Osteomyelitis (HCC)    Peripheral vascular disease (HCC)    Thrombocytopenia (HCC)    Type 2 diabetes mellitus ([place])     Past Surgical History:  Procedure Laterality Date   ABDOMINAL HYSTERECTOMY     COLONOSCOPY     Cystoscopy with ureteral stent     ESOPHAGOGASTRODUODENOSCOPY     ESOPHAGOGASTRODUODENOSCOPY (EGD) WITH PROPOFOL N/A 05/20/2021   Procedure: ESOPHAGOGASTRODUODENOSCOPY (EGD) WITH PROPOFOL;  Surgeon: Daneil Dolin, MD;  [facility];  Service: Endoscopy;  Laterality: N/A;  needs intubation   IR ANGIOGRAM FOLLOW UP STUDY  05/21/2021   IR ANGIOGRAM SELECTIVE EACH ADDITIONAL VESSEL  05/21/2021   IR EMBO VENOUS NOT HEMORR HEMANG  INC GUIDE ROADMAPPING  05/21/2021   IR US GUIDE VASC ACCESS RIGHT  05/21/2021   IR VENOGRAM RENAL UNI LEFT  05/21/2021   RADIOLOGY WITH ANESTHESIA N/A 05/21/2021   Procedure: IR WITH ANESTHESIA;  Surgeon: Radiologist, Medication, MD;  Location: [facility];  Service: Radiology;  Laterality: N/A;   Toe amputations Bilateral    TUBAL LIGATION      Social History  reports that she has quit smoking. Her smoking use included cigarettes. She has never used smokeless tobacco. She reports current alcohol use. She reports that she does not use drugs.  Allergies  Allergen Reactions   Chlorhexidine Rash   Tape Rash    Family History  Problem Relation Age of Onset   Heart failure Mother    Hyperlipidemia Mother    Diabetes Mellitus II Mother    Heart failure Father    Hyperlipidemia Father      Prior to Admission medications   Medication Sig Start Date End Date Taking? Authorizing Provider  furosemide (LASIX) 20 MG tablet Take 20 mg by mouth See admin instructions. Takes 20 mg on Tuesdays & Thursdays 05/27/21  Yes [provider]  gabapentin (NEURONTIN) 600 MG tablet Take 600 mg by mouth 3 (three) times daily. 07/23/21  Yes [provider]  hydrOXYzine (ATARAX/VISTARIL) 25 MG tablet Take 25 mg by mouth daily. 02/20/21  Yes [provider]  insulin aspart (NOVOLOG FLEXPEN) 100 UNIT/ML FlexPen Inject 6 Units into the skin in the morning, at noon, in the evening, and at bedtime. 03/21/21  Yes [provider]  ketorolac (ACULAR) 0.5 % ophthalmic solution Place 1 drop into both eyes 4 (four) times daily. 09/06/21  Yes [provider]  ofloxacin (OCUFLOX) 0.3 % ophthalmic solution Place 1 drop into both eyes 4 (four) times daily. 09/06/21  Yes [provider]  prednisoLONE acetate (PRED FORTE) 1 % ophthalmic suspension Place 1 drop into both eyes 4 (four) times daily. 09/06/21  Yes [provider]  TOUJEO MAX SOLOSTAR 300 UNIT/ML Solostar Pen Inject 65 Units into the skin in the morning and at bedtime. 06/03/21  Yes [provider]  ACCU-CHEK GUIDE test strip  08/27/21   [provider]  Accu-Chek Softclix Lancets lancets SMARTSIG:2 Topical Twice Daily 08/27/21   [provider]  albuterol (VENTOLIN HFA) 108 (90 Base) MCG/ACT inhaler Inhale 2 puffs into the lungs every 4 (four) hours as needed  for shortness of breath or wheezing. 01/11/21   [provider]  benzonatate (TESSALON) 100 MG capsule Take 100 mg by mouth 3 (three) times daily as needed for cough. Not taking 02/05/21   [provider]  Blood Glucose Monitoring Suppl (ACCU-CHEK GUIDE) w/Device KIT  08/27/21   [provider]  calcium-vitamin D (OSCAL WITH D) $Remov'500MG'VqjTdk$ -200UNIT (5MCG) tablet Take 1 tablet by mouth 2 (two) times daily.    [provider]  CALMOSEPTINE 0.44-20.6 % OINT Apply topically 3 (three) times daily. 10/16/21   [provider]  carvedilol (COREG) 6.25 MG tablet Take 1 tablet (6.25 mg total) by mouth 2 (two) times daily. 06/16/21   Arnoldo Lenis, MD  ciprofloxacin (CIPRO) 500 MG tablet Take 500 mg by mouth 2 (two) times daily. 09/02/21   [provider]  clotrimazole-betamethasone (LOTRISONE) cream Apply 1 application topically 2 (two) times daily  as needed (infection). 02/20/21   [provider]  diphenhydramine-acetaminophen (TYLENOL PM) 25-500 MG TABS tablet Take 1 tablet by mouth at bedtime as needed (sleep/pain).    [provider]  ferrous sulfate 325 (65 FE) MG tablet Take 325 mg by mouth in the morning and at bedtime.    [provider]  fluconazole (DIFLUCAN) 100 MG tablet Take 100 mg by mouth daily. 10/18/21   [provider]  folic acid (FOLVITE) 1 MG tablet Take 1 mg by mouth 2 (two) times daily. 06/02/21   [provider]  GLOBAL EASE INJECT PEN NEEDLES 32G X 4 MM MISC  08/27/21   [provider]  lactulose (CHRONULAC) 10 GM/15ML solution Take 20 g by mouth daily as needed for mild constipation. 06/03/21   [provider]  magnesium oxide (MAG-OX) 400 MG tablet Take 400 mg by mouth daily. 12/08/20   [provider]  metFORMIN (GLUCOPHAGE) 500 MG tablet Take 500 mg by mouth 2 (two) times daily. 09/24/21   [provider]  mupirocin ointment (BACTROBAN) 2 % Apply 1 application topically 3 (three) times daily. 09/15/21   [provider]  nystatin cream (MYCOSTATIN) APPLY TO AFFECTED AREA TWICE A DAY 10/29/21   [provider]  pantoprazole (PROTONIX) 40 MG tablet Take 40 mg by mouth daily as needed (heartburn). 05/27/21   [provider]  pravastatin (PRAVACHOL) 20 MG tablet Take 20 mg by mouth at bedtime. 05/27/21   [provider]  sertraline (ZOLOFT) 100 MG tablet Take 150 mg by mouth daily. 03/09/21   [provider]  silver sulfADIAZINE (SILVADENE) 1 % cream Apply 1 application topically daily. 09/04/20   [provider]  spironolactone (ALDACTONE) 50 MG tablet Take 50 mg by mouth every morning. 05/27/21   [provider]  vancomycin HCl (VANCOREADY) 1750 MG/350ML SOLN Inject 1,750 mg into the vein daily. 05/26/21   [provider]  vitamin C (ASCORBIC ACID) 500 MG tablet Take 500 mg by mouth 2  (two) times daily.    [provider]  Vitamin D, Ergocalciferol, (DRISDOL) 1.25 MG (50000 UNIT) CAPS capsule Take 1 capsule by mouth every 7 (seven) days. 04/05/21   [provider]    Physical Exam: Vitals:   11/09/21 1945 11/09/21 2102 11/09/21 2130 11/09/21 2211  BP: (!) 149/63 (!) 127/54 138/64 112/72  Pulse: 86 91 88 84  Resp: $Remo'16 18 20 18  'pTVdk$ Temp: 97.7 F (36.5 C)  97.8 F (36.6 C) 97.8 F (36.6 C)  TempSrc: Oral  Oral Oral  SpO2: 100% 97% 98% 100%  Weight: 86.2 kg     Height: $Remove'5\' 8"'SdROBDU$  (1.727 m)       Constitutional: NAD, calm, comfortable. Vitals:   11/09/21 1945 11/09/21 2102 11/09/21 2130 11/09/21 2211  BP: (!) 149/63 (!) 127/54 138/64 112/72  Pulse: 86 91 88 84  Resp: $Remo'16 18 20 18  'umeSv$ Temp: 97.7 F (36.5 C)  97.8 F (36.6 C) 97.8 F (36.6 C)  TempSrc: Oral  Oral Oral  SpO2: 100% 97% 98% 100%  Weight: 86.2 kg     Height: $Remove'5\' 8"'EGiZAJi$  (1.727 m)      General: WDWN, Alert and oriented x3.  Eyes: EOMI, PERRL, conjunctivae normal.  Sclera nonicteric HENT:  /AT, external ears normal.  Nares patent without epistasis.  Mucous membranes are moist.  Neck: Soft, normal range of motion, supple, no masses, Trachea midline Respiratory: Equal breath sounds to auscultation bilaterally, diffuse rales. no wheezing, no crackles. Normal respiratory effort. No accessory muscle use.  Cardiovascular: Regular rate and rhythm, no murmurs / rubs / gallops. No extremity edema. 2+ pedal pulses.  Abdomen: Soft, no tenderness, nondistended, no rebound or guarding.  No masses palpated. Bowel sounds normoactive Musculoskeletal: FROM. no cyanosis. No joint deformity upper and lower extremities. Normal muscle tone.  Skin: Warm, dry, intact no rashes, lesions, ulcers. No induration Neurologic: CN 2-12 grossly intact. Normal speech.  Sensation decreased in distal left foot otherwise normal, patella DTR +1 bilaterally. Strength 5/5 in all extremities.   Psychiatric: Depressed mood. Flat affect.     Labs on Admission: I have personally reviewed following labs and imaging studies  CBC: Recent Labs  Lab 11/09/21 2013  WBC 1.5*  NEUTROABS 0.8*  HGB 8.5*  HCT 27.2*  MCV 93.8  PLT 57*    Basic Metabolic Panel: Recent Labs  Lab 11/09/21 2013  NA 134*  K 3.9  CL 101  CO2 24  GLUCOSE 605*  BUN 19  CREATININE 0.89  CALCIUM 8.8*    GFR: Estimated Creatinine Clearance: 70.5 mL/min (by C-G formula based on SCr of 0.89 mg/dL).  Liver Function Tests: Recent Labs  Lab 11/09/21 2013  AST 86*  ALT 59*  ALKPHOS 124  BILITOT 0.9  PROT 7.0  ALBUMIN 3.3*    Urine analysis: No results found for: COLORURINE, APPEARANCEUR, LABSPEC, PHURINE, GLUCOSEU, HGBUR, BILIRUBINUR, KETONESUR, PROTEINUR, UROBILINOGEN, NITRITE, LEUKOCYTESUR  Radiological Exams on Admission: DG Foot Complete Left  Result Date: 11/09/2021 Please see detailed radiograph report in office note.    Assessment/Plan Principal Problem:   Chronic osteomyelitis involving ankle and foot, left  Ms. Rocha is admitted to [facility] floor.  She has chronic osteomyelitis of her left foot.  She was seen earlier in the day by podiatry and since she recently completed a course of antibiotic but is worsening osteomyelitis on x-ray she was sent to the hospital for admission and further treatment.  She will most likely need debridement versus amputation.  Podiatry to see in the morning and make further plans for definitive treatment She is started on vancomycin for antibiotic coverage, pharmacy to follow and dose  Active Problems:   Uncontrolled type 2 diabetes mellitus with hyperglycemia  She has poorly controlled diabetes mellitus.  She reports she ran out of her insulin last week and has not been taking it.  Check hemoglobin A1c Will need to verify her basal insulin dosing and resume it tomorrow after being reconciled and verified Monitor blood sugars with meals and at bedtime.  Corrective insulin scale provided for  glycemic control Patient continued on  metformin    Essential hypertension Continue Coreg.  Patient should be on an ACE inhibitor or ARB therapy for renal protection and to assist with blood pressure control.  We will start low-dose lisinopril.    Pancytopenia Chronic    Liver cirrhosis secondary to NASH  Chronic.  Continue beta-blocker and spironolactone.    Depressive disorder Continue Zoloft.   DVT prophylaxis: SCDs for DVT prophylaxis with thrombocytopenia  Code Status:   Full Code  Family Communication:  Diagnosis and plan discussed with patient.  Patient verbalized understanding agrees with plan.  Further recommendations to follow as clinical indicated Disposition Plan:   Patient is from:  Home  Anticipated DC to:  Home  Anticipated DC date:  Anticipate 2 midnight or more stay in the hospital  Consults called:  Podiatry, Dr. Earleen Newport, consulted by ER physician and will see in am  Admission status:  Inpatient   Yevonne Aline Fitz Matsuo MD [facility]  How to contact the [facility] Attending or Consulting provider [place] or covering provider during after hours [place], for this patient?   Check the care team in [facility] and look for a) attending/consulting [facility] provider listed and b) the [facility] team listed Log into [email or web address] and use [place]'s universal password to access. If you do not have the password, please contact the hospital operator. Locate the [facility] provider you are looking for under [facility] and page to a number that you can be directly reached. If you still have difficulty reaching the provider, please page the [facility] (Director on Call) for the Hospitalists listed on amion for assistance.  11/09/2021, 10:25 PM

## 2021-11-09 NOTE — ED Provider Notes (Signed)
[facility] DEPT Provider Note   CSN: [id number] Arrival date & time: 11/09/21  1921     History Chief Complaint  Patient presents with   Wound Infection    Ariel Arnold is a 65 y.o. female.  Patient is a 65 year old female with a history of diabetes, recurrent osteomyelitis of the left foot status post amputation and ongoing wound, PVD, AVMs with prior GI bleeding and anemia who is presenting today due to persistent left foot swelling, drainage and concern for worsening osteomyelitis.  Patient saw her podiatrist Dr. Earleen Newport today who has been following her foot wound for some time.  She has had multiple rounds of IV antibiotics for her foot and last finished IV vancomycin 3 weeks ago.  She has continued oral Cipro in the last week because of worsening redness and more drainage.  She denies any systemic symptoms at this time.  She saw Dr. Earleen Newport today and he did imaging and was concern for worsening osteomyelitis.  He was attempting to get outpatient evaluation and debridement for her but was unable to get things scheduled and was concerned that it would progress and she would not get what she needed in a reasonable timeframe so recommended she come to the emergency room for admission, IV antibiotics and they could debride her foot.  The history is provided by the patient.      Past Medical History:  Diagnosis Date   Anemia    Anxiety    Aortic atherosclerosis ([place])    Arthritis    AVM (arteriovenous malformation) of colon    Depression    Essential hypertension    GI bleeding    Recurrent   Hepatic encephalopathy    History of kidney stones    History of RSV infection    Liver cirrhosis secondary to NASH ([place]) 2004   Biopsy-proven   Obesity    Osteomyelitis ([place])    Peripheral vascular disease (H[facility])    Thrombocytopenia ([place])    Type 2 diabetes mellitus ([place])     Patient Active Problem List   Diagnosis Date Noted   Pressure injury of  skin 05/21/2021   Encephalopathy 05/20/2021   Hematemesis    Acute GI bleeding 05/19/2021   Liver cirrhosis secondary to NASH ([place]) 05/19/2021   Hepatic encephalopathy 05/19/2021   Thrombocytopenia ([place]) 05/19/2021   Acute osteomyelitis of left foot ([place]) 05/19/2021   Right calf pain 04/27/2021   Swelling of left lower extremity 04/19/2021   Encounter for coordination of complex care 04/02/2021   ABLA (acute blood loss anemia) 02/15/2021   Obesity (BMI 30-39.9) 02/15/2021   Acquired absence of left foot ([place]) 01/11/2021   Non-healing wound of amputation stump ([place]) 09/09/2020   Coronary atherosclerosis 08/04/2020   Colitis 07/16/2020   Melena 07/16/2020   Weak 07/16/2020   Localized edema 12/25/2019   Non-prs chronic ulcer oth prt right foot w fat layer exposed ([place]) 12/24/2019   Anemia in chronic kidney disease 12/20/2019   Acquired absence of other left toe(s) ([place]) 11/19/2019   Stage 2 chronic kidney disease due to type 2 diabetes mellitus ([place]) 08/01/2019   Chest pain 06/18/2019   Dyspnea on exertion 28/41/3244   Nonalcoholic fatty liver disease 05/07/2019   Type 2 diabetes mellitus treated with insulin ([place]) 05/06/2019   Depressive disorder 04/18/2019   Generalized anxiety disorder 04/18/2019   Primary hypertension 04/18/2019   Seasonal allergic rhinitis 04/18/2019   Arthritis 04/18/2019   Mixed hyperlipidemia 12/19/2018    Past  Surgical History:  Procedure Laterality Date   ABDOMINAL HYSTERECTOMY     COLONOSCOPY     Cystoscopy with ureteral stent     ESOPHAGOGASTRODUODENOSCOPY     ESOPHAGOGASTRODUODENOSCOPY (EGD) WITH PROPOFOL N/A 05/20/2021   Procedure: ESOPHAGOGASTRODUODENOSCOPY (EGD) WITH PROPOFOL;  Surgeon: Daneil Dolin, MD;  Location[facility];  Service: Endoscopy;  Laterality: N/A;  needs intubation   IR ANGIOGRAM FOLLOW UP STUDY  05/21/2021   IR ANGIOGRAM SELECTIVE EACH ADDITIONAL VESSEL  05/21/2021   IR EMBO VENOUS NOT HEMORR HEMANG  INC GUIDE ROADMAPPING   05/21/2021   IR US GUIDE VASC ACCESS RIGHT  05/21/2021   IR VENOGRAM RENAL UNI LEFT  05/21/2021   RADIOLOGY WITH ANESTHESIA N/A 05/21/2021   Procedure: IR WITH ANESTHESIA;  Surgeon: Radiologist, Medication, MD;  Location: [facility];  Service: Radiology;  Laterality: N/A;   Toe amputations Bilateral    TUBAL LIGATION       OB History   No obstetric history on file.     Family History  Problem Relation Age of Onset   Heart failure Mother    Hyperlipidemia Mother    Diabetes Mellitus II Mother    Heart failure Father    Hyperlipidemia Father     Social History   Tobacco Use   Smoking status: Former    Types: Cigarettes   Smokeless tobacco: Never  Vaping Use   Vaping Use: Never used  Substance Use Topics   Alcohol use: Yes    Comment: wine rare   Drug use: Never    Home Medications Prior to Admission medications   Medication Sig Start Date End Date Taking? Authorizing Provider  ACCU-CHEK GUIDE test strip  08/27/21   [provider]  Accu-Chek Softclix Lancets lancets SMARTSIG:2 Topical Twice Daily 08/27/21   [provider]  albuterol (VENTOLIN HFA) 108 (90 Base) MCG/ACT inhaler Inhale 2 puffs into the lungs every 4 (four) hours as needed for shortness of breath or wheezing. 01/11/21   [provider]  benzonatate (TESSALON) 100 MG capsule Take 100 mg by mouth 3 (three) times daily as needed for cough. Not taking 02/05/21   [provider]  Blood Glucose Monitoring Suppl (ACCU-CHEK GUIDE) w/Device KIT  08/27/21   [provider]  calcium-vitamin D (OSCAL WITH D) $Remov'500MG'liiwcU$ -200UNIT (5MCG) tablet Take 1 tablet by mouth 2 (two) times daily.    [provider]  CALMOSEPTINE 0.44-20.6 % OINT Apply topically 3 (three) times daily. 10/16/21   [provider]  carvedilol (COREG) 6.25 MG tablet Take 1 tablet (6.25 mg total) by mouth 2 (two) times daily. 06/16/21   Arnoldo Lenis, MD  ciprofloxacin (CIPRO) 500 MG tablet Take 500 mg by mouth  2 (two) times daily. 09/02/21   [provider]  clotrimazole-betamethasone (LOTRISONE) cream Apply 1 application topically 2 (two) times daily as needed (infection). 02/20/21   [provider]  diphenhydramine-acetaminophen (TYLENOL PM) 25-500 MG TABS tablet Take 1 tablet by mouth at bedtime as needed (sleep/pain).    [provider]  ferrous sulfate 325 (65 FE) MG tablet Take 325 mg by mouth in the morning and at bedtime.    [provider]  fluconazole (DIFLUCAN) 100 MG tablet Take 100 mg by mouth daily. 10/18/21   [provider]  folic acid (FOLVITE) 1 MG tablet Take 1 mg by mouth 2 (two) times daily. 06/02/21   [provider]  furosemide (LASIX) 20 MG tablet Take 20 mg by mouth See admin instructions.  Takes 20 mg on Tuesdays & Thursdays 05/27/21   [provider]  gabapentin (NEURONTIN) 600 MG tablet Take 600 mg by mouth 2 (two) times daily. 07/23/21   [provider]  GLOBAL EASE INJECT PEN NEEDLES 32G X 4 MM MISC  08/27/21   [provider]  HUMALOG KWIKPEN 100 UNIT/ML KwikPen Inject into the skin as directed. 10/25/21   [provider]  hydrOXYzine (ATARAX/VISTARIL) 25 MG tablet Take 25 mg by mouth daily. 02/20/21   [provider]  insulin aspart (NOVOLOG FLEXPEN) 100 UNIT/ML FlexPen Inject 6 Units into the skin in the morning, at noon, in the evening, and at bedtime. 03/21/21   [provider]  ketorolac (ACULAR) 0.5 % ophthalmic solution Place 1 drop into both eyes 4 (four) times daily. 09/06/21   [provider]  lactulose (CHRONULAC) 10 GM/15ML solution Take 20 g by mouth daily as needed for mild constipation. 06/03/21   [provider]  magnesium oxide (MAG-OX) 400 MG tablet Take 400 mg by mouth daily. 12/08/20   [provider]  metFORMIN (GLUCOPHAGE) 500 MG tablet Take 500 mg by mouth 2 (two) times daily. 09/24/21   [provider]  mupirocin ointment  (BACTROBAN) 2 % Apply 1 application topically 3 (three) times daily. 09/15/21   [provider]  nystatin cream (MYCOSTATIN) APPLY TO AFFECTED AREA TWICE A DAY 10/29/21   [provider]  ofloxacin (OCUFLOX) 0.3 % ophthalmic solution Place 1 drop into both eyes 4 (four) times daily. 09/06/21   [provider]  pantoprazole (PROTONIX) 40 MG tablet Take 40 mg by mouth daily as needed (heartburn). 05/27/21   [provider]  pravastatin (PRAVACHOL) 20 MG tablet Take 20 mg by mouth at bedtime. 05/27/21   [provider]  prednisoLONE acetate (PRED FORTE) 1 % ophthalmic suspension Place 1 drop into both eyes 4 (four) times daily. 09/06/21   [provider]  sertraline (ZOLOFT) 100 MG tablet Take 150 mg by mouth daily. 03/09/21   [provider]  silver sulfADIAZINE (SILVADENE) 1 % cream Apply 1 application topically daily. 09/04/20   [provider]  spironolactone (ALDACTONE) 50 MG tablet Take 50 mg by mouth every morning. 05/27/21   [provider]  TOUJEO MAX SOLOSTAR 300 UNIT/ML Solostar Pen Inject 65 Units into the skin in the morning and at bedtime. 06/03/21   [provider]  vancomycin HCl (VANCOREADY) 1750 MG/350ML SOLN Inject 1,750 mg into the vein daily. 05/26/21   [provider]  vitamin C (ASCORBIC ACID) 500 MG tablet Take 500 mg by mouth 2 (two) times daily.    [provider]  Vitamin D, Ergocalciferol, (DRISDOL) 1.25 MG (50000 UNIT) CAPS capsule Take 1 capsule by mouth every 7 (seven) days. 04/05/21   [provider]    Allergies    Chlorhexidine and Tape  Review of Systems   Review of Systems  Constitutional:  Negative for chills and fever.  HENT:  Negative for ear pain and sore throat.   Eyes:  Negative for pain and visual disturbance.  Respiratory:  Negative for cough and shortness of breath.   Cardiovascular:  Negative for chest pain and palpitations.  Gastrointestinal:   Negative for abdominal pain and vomiting.  Genitourinary:  Negative for dysuria and hematuria.  Musculoskeletal:  Negative for arthralgias and back pain.  Skin:  Positive for wound. Negative for color change and rash.  Neurological:  Negative for seizures and syncope.  All other systems reviewed  and are negative.  Physical Exam Updated Vital Signs BP (!) 149/63 (BP Location: Left Arm)   Pulse 86   Temp 97.7 F (36.5 C) (Oral)   Resp 16   Ht $R'5\' 8"'Ke$  (1.727 m)   Wt 86.2 kg   SpO2 100%   BMI 28.89 kg/m   Physical Exam Vitals and nursing note reviewed.  Constitutional:      General: She is not in acute distress.    Appearance: Normal appearance. She is well-developed.  HENT:     Head: Normocephalic and atraumatic.  Eyes:     Pupils: Pupils are equal, round, and reactive to light.  Cardiovascular:     Rate and Rhythm: Normal rate and regular rhythm.     Heart sounds: Normal heart sounds. No murmur heard.   No friction rub.  Pulmonary:     Effort: Pulmonary effort is normal.     Breath sounds: Normal breath sounds. No wheezing or rales.  Abdominal:     General: Bowel sounds are normal. There is no distension.     Palpations: Abdomen is soft.     Tenderness: There is no abdominal tenderness. There is no guarding or rebound.  Musculoskeletal:        General: No tenderness. Normal range of motion.       Feet:     Comments: No edema  Skin:    General: Skin is warm and dry.     Findings: No rash.  Neurological:     Mental Status: She is alert and oriented to person, place, and time. Mental status is at baseline.     Cranial Nerves: No cranial nerve deficit.  Psychiatric:        Mood and Affect: Mood normal.        Behavior: Behavior normal.    ED Results / Procedures / Treatments   Labs (all labs ordered are listed, but only abnormal results are displayed) Labs Reviewed  CBC WITH DIFFERENTIAL/PLATELET - Abnormal; Notable for the following components:      Result Value    WBC 1.5 (*)    RBC 2.90 (*)    Hemoglobin 8.5 (*)    HCT 27.2 (*)    Platelets 57 (*)    Neutro Abs 0.8 (*)    Lymphs Abs 0.6 (*)    All other components within normal limits  COMPREHENSIVE METABOLIC PANEL - Abnormal; Notable for the following components:   Sodium 134 (*)    Glucose, Bld 605 (*)    Calcium 8.8 (*)    Albumin 3.3 (*)    AST 86 (*)    ALT 59 (*)    All other components within normal limits  RESP PANEL BY RT-PCR (FLU A&B, COVID) ARPGX2  SEDIMENTATION RATE  C-REACTIVE PROTEIN  PATHOLOGIST SMEAR REVIEW    EKG None  Radiology DG Foot Complete Left  Result Date: 11/09/2021 Please see detailed radiograph report in office note.   Procedures Procedures   Medications Ordered in ED Medications - No data to display  ED Course  I have reviewed the triage vital signs and the nursing notes.  Pertinent labs & imaging results that were available during my care of the patient were reviewed by me and considered in my medical decision making (see chart for details).    MDM Rules/Calculators/A&P                           Patient presenting today for admission  for IV antibiotics of her ongoing foot wound and concern for worsening osteomyelitis.  Spoke with Dr. March Rummage who works with Dr. Earleen Newport with podiatry and he reports that they plan on seeing the patient in the hospital and debriding her foot and providing ongoing wound care.  She needs admission to the hospitalist service with antibiotics.  10:05 PM Patient's COVID is negative, CBC with a leukopenia of white count of 1.5, hemoglobin of 8.5 and platelet count of 57.  Patient has been having pancytopenia similar to this now for over a year.  She also has a history of chronic anemia due to bleeding AVMs which have had multiple recurrences and intervention.  She is not having any GI bleeding at this time.  CMP today with blood sugar of 600 with normal creatinine and mildly elevated LFTs.  This is most likely related to  her noncompliance with her medications.  No indication that patient is in DKA today.  Patient given IV insulin and her home dose of metformin.  Vancomycin was ordered per pharmacy.  MDM   Amount and/or Complexity of Data Reviewed Clinical lab tests: ordered and reviewed Independent visualization of images, tracings, or specimens: yes     Final Clinical Impression(s) / ED Diagnoses Final diagnoses:  Wound infection  Subacute osteomyelitis of left foot ([place])  Hyperglycemia    Rx / DC Orders ED Discharge Orders     None        Blanchie Dessert, MD 11/09/21 2207

## 2021-11-10 ENCOUNTER — Inpatient Hospital Stay (HOSPITAL_COMMUNITY): Payer: Medicare Other

## 2021-11-10 DIAGNOSIS — M86672 Other chronic osteomyelitis, left ankle and foot: Secondary | ICD-10-CM

## 2021-11-10 LAB — BASIC METABOLIC PANEL
Anion gap: 5 (ref 5–15)
BUN: 18 mg/dL (ref 8–23)
CO2: 25 mmol/L (ref 22–32)
Calcium: 8.3 mg/dL — ABNORMAL LOW (ref 8.9–10.3)
Chloride: 108 mmol/L (ref 98–111)
Creatinine, Ser: 0.71 mg/dL (ref 0.44–1.00)
GFR, Estimated: >60 mL/min (ref >60)
Glucose, Bld: 134 mg/dL — ABNORMAL HIGH (ref 70–99)
Potassium: 3.8 mmol/L (ref 3.5–5.1)
Sodium: 138 mmol/L (ref 135–145)

## 2021-11-10 LAB — CBC
HCT: 24.3 % — ABNORMAL LOW (ref 36.0–46.0)
Hemoglobin: 7.6 g/dL — ABNORMAL LOW (ref 12.0–15.0)
MCH: 29.2 pg (ref 26.0–34.0)
MCHC: 31.3 g/dL (ref 30.0–36.0)
MCV: 93.5 fL (ref 80.0–100.0)
Platelets: 53 10*3/uL — ABNORMAL LOW (ref 150–400)
RBC: 2.6 MIL/uL — ABNORMAL LOW (ref 3.87–5.11)
RDW: 13.9 % (ref 11.5–15.5)
WBC: 1.6 10*3/uL — ABNORMAL LOW (ref 4.0–10.5)
nRBC: 0 % (ref 0.0–0.2)

## 2021-11-10 LAB — GLUCOSE, CAPILLARY
Glucose-Capillary: 248 mg/dL — ABNORMAL HIGH (ref 70–99)
Glucose-Capillary: 142 mg/dL — ABNORMAL HIGH (ref 70–99)
Glucose-Capillary: 219 mg/dL — ABNORMAL HIGH (ref 70–99)
Glucose-Capillary: 123 mg/dL — ABNORMAL HIGH (ref 70–99)
Glucose-Capillary: 242 mg/dL — ABNORMAL HIGH (ref 70–99)

## 2021-11-10 LAB — HEMOGLOBIN A1C
Hgb A1c MFr Bld: 9.4 % — ABNORMAL HIGH (ref 4.8–5.6)
Mean Plasma Glucose: 223.08 mg/dL

## 2021-11-10 LAB — C-REACTIVE PROTEIN: CRP: 0.9 mg/dL (ref <1.0)

## 2021-11-10 IMAGING — MR MR FOOT*L* WO/W CM
9 series · 39 of 40 positions shown · IV contrast (gadavist)
Comparison: Left foot x-ray [DATE]

CLINICAL DATA: Possible osteomyelitis.  Open wound

EXAM:
MRI OF THE LEFT FOREFOOT WITHOUT AND WITH CONTRAST
TECHNIQUE: Multiplanar, multisequence MR imaging of the left foot was performed
both before and after administration of intravenous contrast.
CONTRAST:  8mL GADAVIST GADOBUTROL 1 MMOL/ML IV SOLN

[Series 3: T1 · coronal · left · 3.0mm · 0.47mm/px · 4 of 35 slices shown (1 of 2)]
[im 1/35]
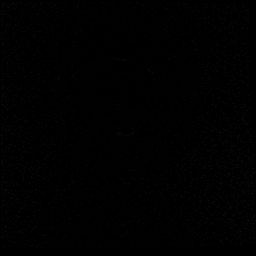
[im 12/35]
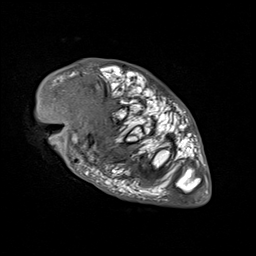
[im 23/35]
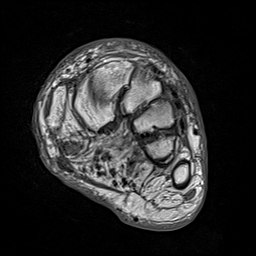
[im 35/35]
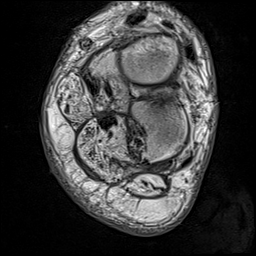

[Series 4: T2 fat-sat · coronal · left · 3.0mm · 0.38mm/px · 5 of 37 slices shown (1 of 2)]
[im 1/37]
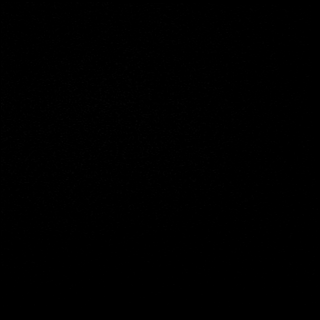
[im 10/37]
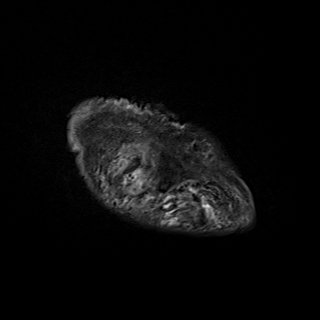
[im 19/37]
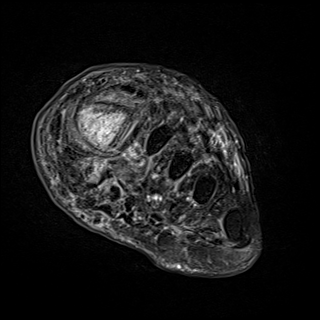
[im 28/37]
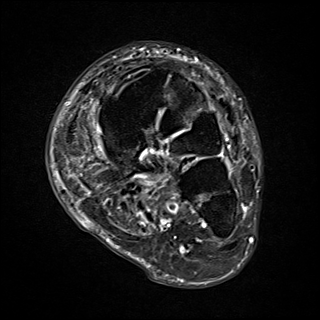
[im 37/37]
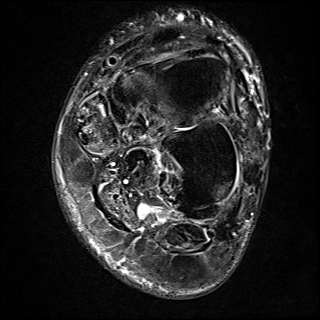

[Series 5: T2 fat-sat · oblique · left · 3.0mm · 0.70mm/px · 4 of 33 slices shown (2 of 2)]
[im 1/33]
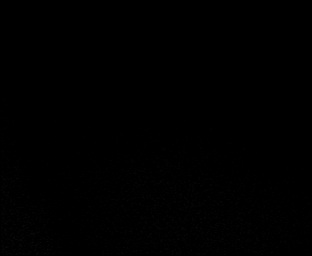
[im 11/33]
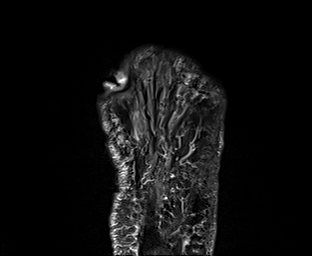
[im 22/33]
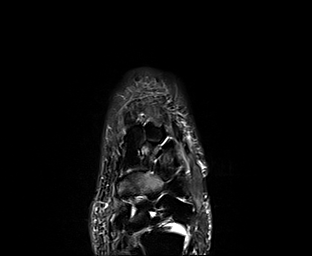
[im 33/33]
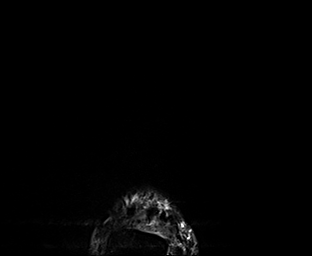

[Series 6: T1 · oblique · left · 3.0mm · 0.70mm/px · 5 of 35 slices shown (2 of 2)]
[im 1/35]
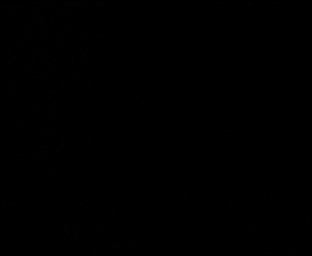
[im 9/35]
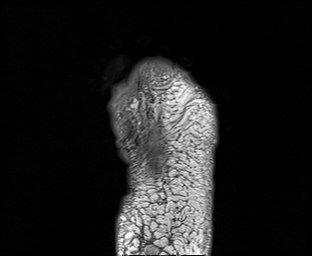
[im 18/35]
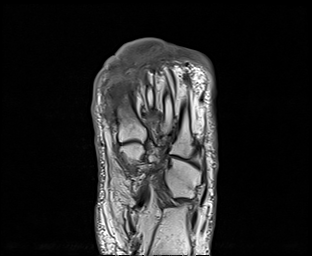
[im 26/35]
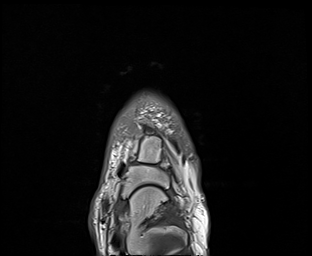
[im 35/35]
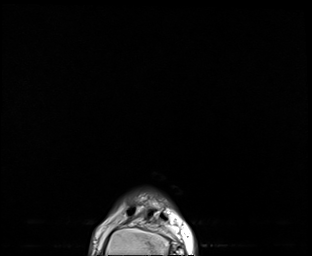

[Series 7: STIR · sagittal · left · 3.0mm · 0.35mm/px · 3 of 32 slices shown]
[im 1/32]
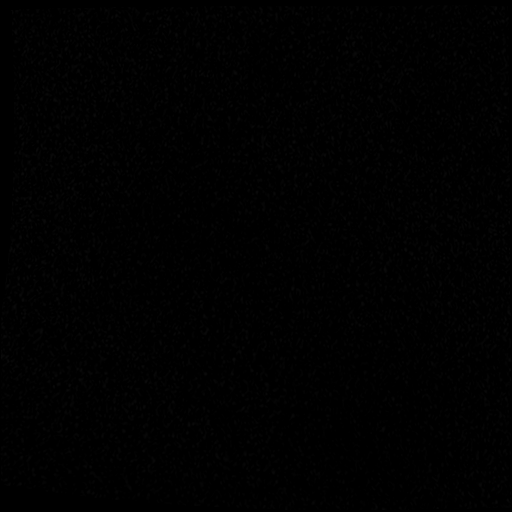
[im 11/32]
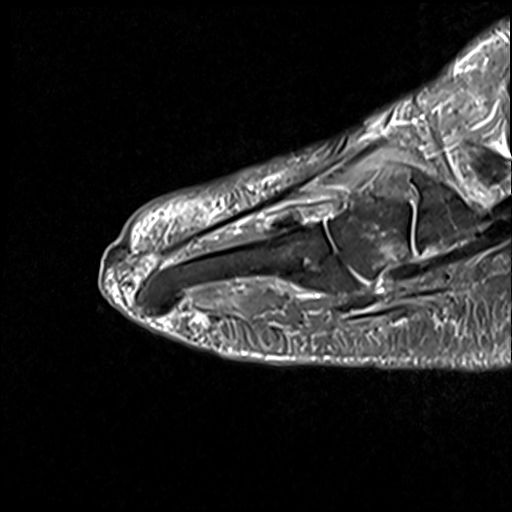
[im 21/32]
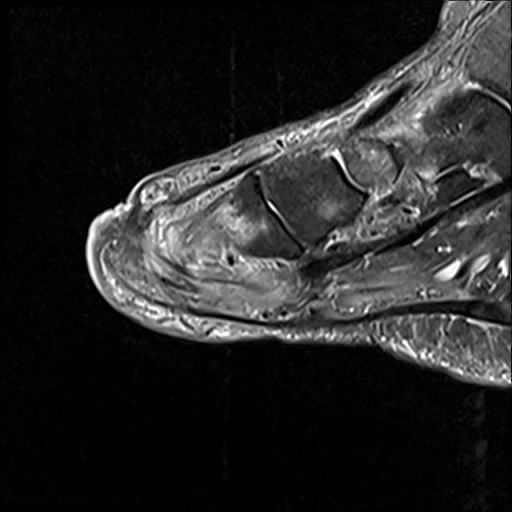

[Series 8: T1 fat-sat · coronal · non-contrast · left · 3.0mm · 0.47mm/px · 5 of 35 slices shown]
[im 1/35]
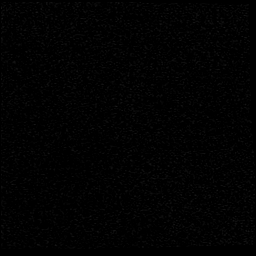
[im 9/35]
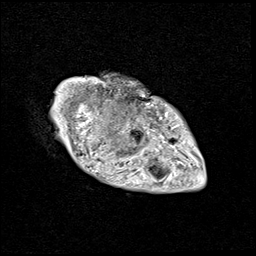
[im 18/35]
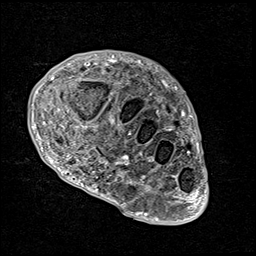
[im 26/35]
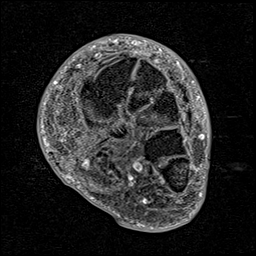
[im 35/35]
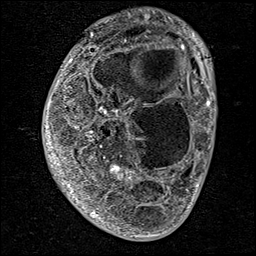

[Series 10: T1 fat-sat post-contrast · sagittal · left · 3.0mm · 0.35mm/px · 4 of 31 slices shown (1 of 3)]
[im 1/31]
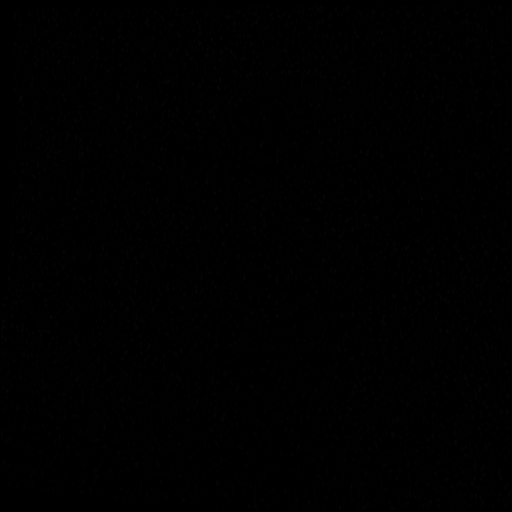
[im 11/31]
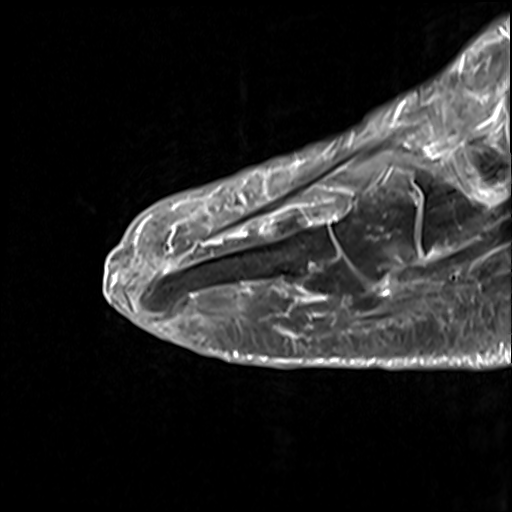
[im 21/31]
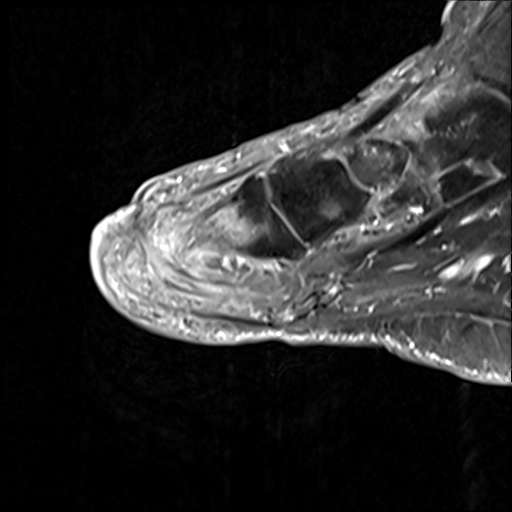
[im 31/31]
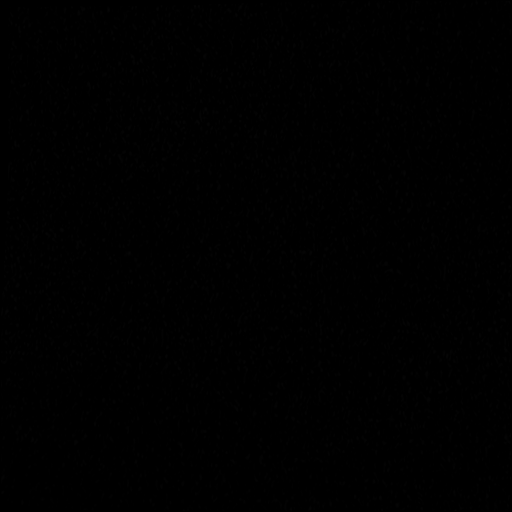

[Series 11: T1 fat-sat post-contrast · oblique · left · 3.0mm · 0.56mm/px · 4 of 34 slices shown (2 of 3)]
[im 1/34]
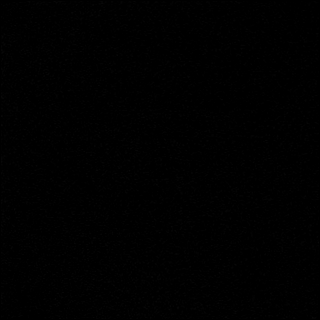
[im 12/34]
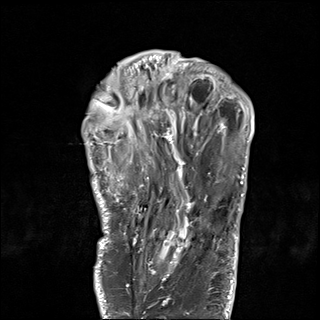
[im 23/34]
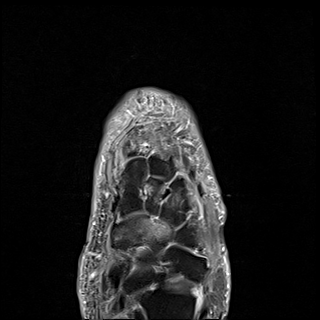
[im 34/34]
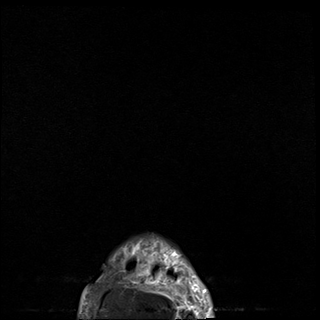

[Series 12: T1 fat-sat post-contrast · coronal · left · 3.0mm · 0.47mm/px · 5 of 35 slices shown (3 of 3)]
[im 1/35]
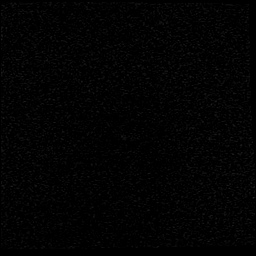
[im 9/35]
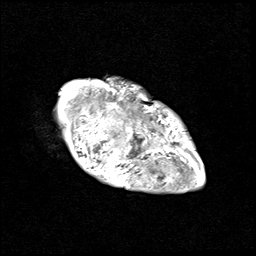
[im 18/35]
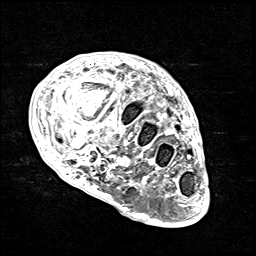
[im 26/35]
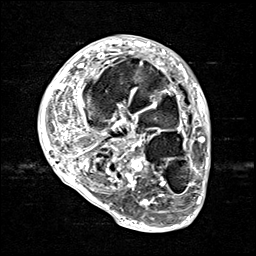
[im 35/35]
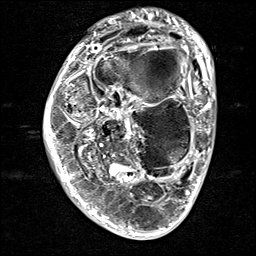

[39 of 40 positions shown; findings below may reference images not displayed]

FINDINGS: Study is somewhat limited due to motion.

Bones/Joint/Cartilage

Previous amputation of the toes. There is cortical destruction at
the distal first metatarsal with marked pulmonary edema and
enhancement throughout most of the first metatarsal with sparing of
the base, consistent with acute osteomyelitis. No definite
osteomyelitis in the second through fifth metatarsals. Patchy mild
bone marrow edema and enhancement visualized in the cuneiform bones,
cuboid bone and navicular bone felt to be most likely
reactive/degenerative in nature.

Ligaments

No significant findings.

Muscles and Tendons

Mild edema in the intrinsic musculature of the foot. No evidence of
tenosynovitis.

Soft tissues

Mild diffuse subcutaneous soft tissue swelling. Soft tissue swelling
and enhancement most significant at the medial distal stump
consistent with cellulitis. Soft tissue ulcer at the medial distal
stump with tract extending to the distal first metatarsal.
IMPRESSION: 1. Soft tissue ulceration at the distal medial foot stump with tract
extending to the distal first metatarsal and surrounding cellulitis.
2. Acute osteomyelitis of the first metatarsal.
3. Other findings as described.

## 2021-11-10 MED ORDER — GABAPENTIN 300 MG PO CAPS
600.0000 mg | ORAL_CAPSULE | Freq: Three times a day (TID) | ORAL | Status: DC
  Administered 2021-11-10 – 2021-11-17 (×22): 600 mg via ORAL
  Filled 2021-11-10 (×22): qty 2

## 2021-11-10 MED ORDER — INSULIN ASPART 100 UNIT/ML IJ SOLN
0.0000 [IU] | Freq: Three times a day (TID) | INTRAMUSCULAR | Status: DC
  Administered 2021-11-10: 2 [IU] via SUBCUTANEOUS
  Administered 2021-11-10: 5 [IU] via SUBCUTANEOUS
  Administered 2021-11-10: 2 [IU] via SUBCUTANEOUS
  Administered 2021-11-11: 15 [IU] via SUBCUTANEOUS
  Administered 2021-11-11: 11 [IU] via SUBCUTANEOUS
  Administered 2021-11-11 – 2021-11-13 (×2): 8 [IU] via SUBCUTANEOUS
  Administered 2021-11-13: 3 [IU] via SUBCUTANEOUS
  Administered 2021-11-13: 5 [IU] via SUBCUTANEOUS
  Administered 2021-11-14: 09:00:00 3 [IU] via SUBCUTANEOUS
  Administered 2021-11-14: 17:00:00 8 [IU] via SUBCUTANEOUS
  Administered 2021-11-14: 12:00:00 2 [IU] via SUBCUTANEOUS
  Administered 2021-11-15: 18:00:00 5 [IU] via SUBCUTANEOUS
  Administered 2021-11-15 (×2): 3 [IU] via SUBCUTANEOUS
  Administered 2021-11-16: 5 [IU] via SUBCUTANEOUS
  Administered 2021-11-16: 2 [IU] via SUBCUTANEOUS
  Administered 2021-11-16: 3 [IU] via SUBCUTANEOUS
  Administered 2021-11-17 (×2): 5 [IU] via SUBCUTANEOUS

## 2021-11-10 MED ORDER — CARVEDILOL 6.25 MG PO TABS
6.2500 mg | ORAL_TABLET | Freq: Two times a day (BID) | ORAL | Status: DC
  Administered 2021-11-10 – 2021-11-17 (×14): 6.25 mg via ORAL
  Filled 2021-11-10 (×14): qty 1

## 2021-11-10 MED ORDER — ONDANSETRON HCL 4 MG/2ML IJ SOLN: 4.0000 mg | Freq: Four times a day (QID) | INTRAMUSCULAR | Status: DC | PRN

## 2021-11-10 MED ORDER — FOLIC ACID 1 MG PO TABS
1.0000 mg | ORAL_TABLET | Freq: Two times a day (BID) | ORAL | Status: DC
  Administered 2021-11-10 – 2021-11-17 (×14): 1 mg via ORAL
  Filled 2021-11-10 (×14): qty 1

## 2021-11-10 MED ORDER — METFORMIN HCL 500 MG PO TABS
500.0000 mg | ORAL_TABLET | Freq: Two times a day (BID) | ORAL | Status: DC
  Administered 2021-11-10 (×2): 500 mg via ORAL
  Filled 2021-11-10 (×2): qty 1

## 2021-11-10 MED ORDER — ALBUTEROL SULFATE (2.5 MG/3ML) 0.083% IN NEBU: 3.0000 mL | INHALATION_SOLUTION | RESPIRATORY_TRACT | Status: DC | PRN

## 2021-11-10 MED ORDER — ACETAMINOPHEN 325 MG PO TABS
650.0000 mg | ORAL_TABLET | Freq: Four times a day (QID) | ORAL | Status: DC | PRN
  Administered 2021-11-11 – 2021-11-17 (×3): 650 mg via ORAL
  Filled 2021-11-10 (×3): qty 2

## 2021-11-10 MED ORDER — FERROUS SULFATE 325 (65 FE) MG PO TABS
325.0000 mg | ORAL_TABLET | Freq: Every day | ORAL | Status: DC
  Administered 2021-11-10 – 2021-11-17 (×7): 325 mg via ORAL
  Filled 2021-11-10 (×7): qty 1

## 2021-11-10 MED ORDER — SODIUM CHLORIDE 0.9 % IV SOLN
2.0000 g | Freq: Three times a day (TID) | INTRAVENOUS | Status: DC
  Administered 2021-11-10 – 2021-11-16 (×19): 2 g via INTRAVENOUS
  Filled 2021-11-10 (×23): qty 2

## 2021-11-10 MED ORDER — INSULIN ASPART 100 UNIT/ML IJ SOLN
0.0000 [IU] | Freq: Every day | INTRAMUSCULAR | Status: DC
  Administered 2021-11-10 – 2021-11-11 (×2): 2 [IU] via SUBCUTANEOUS
  Administered 2021-11-12: 3 [IU] via SUBCUTANEOUS
  Administered 2021-11-13 – 2021-11-16 (×2): 2 [IU] via SUBCUTANEOUS

## 2021-11-10 MED ORDER — GADOBUTROL 1 MMOL/ML IV SOLN
8.0000 mL | Freq: Once | INTRAVENOUS | Status: AC | PRN
  Administered 2021-11-10: 8 mL via INTRAVENOUS

## 2021-11-10 MED ORDER — ACETAMINOPHEN 650 MG RE SUPP: 650.0000 mg | Freq: Four times a day (QID) | RECTAL | Status: DC | PRN

## 2021-11-10 MED ORDER — LACTATED RINGERS IV SOLN: INTRAVENOUS | Status: DC

## 2021-11-10 MED ORDER — INSULIN GLARGINE-YFGN 100 UNIT/ML ~~LOC~~ SOLN
25.0000 [IU] | Freq: Every day | SUBCUTANEOUS | Status: DC
  Administered 2021-11-10 – 2021-11-11 (×2): 25 [IU] via SUBCUTANEOUS
  Filled 2021-11-10 (×4): qty 0.25

## 2021-11-10 MED ORDER — ONDANSETRON HCL 4 MG PO TABS: 4.0000 mg | ORAL_TABLET | Freq: Four times a day (QID) | ORAL | Status: DC | PRN

## 2021-11-10 MED ORDER — SERTRALINE HCL 50 MG PO TABS
150.0000 mg | ORAL_TABLET | Freq: Every day | ORAL | Status: DC
  Administered 2021-11-10 – 2021-11-17 (×7): 150 mg via ORAL
  Filled 2021-11-10 (×7): qty 1

## 2021-11-10 MED ORDER — SPIRONOLACTONE 25 MG PO TABS
50.0000 mg | ORAL_TABLET | Freq: Every morning | ORAL | Status: DC
  Administered 2021-11-10 – 2021-11-13 (×3): 50 mg via ORAL
  Filled 2021-11-10 (×5): qty 2

## 2021-11-10 NOTE — Consult Note (Signed)
Reason for Consult: left foot ulcer Referring Physician: Chotiner  Ariel Arnold is an 65 y.o. female.  HPI: Patient sent from podiatry office for worsening wound to the left foot by Dr. Ardelle Anton. See his progress note for full history. Denies complaints at present.  Past Medical History:  Diagnosis Date   Anemia    Anxiety    Aortic atherosclerosis (HCC)    Arthritis    AVM (arteriovenous malformation) of colon    Depression    Essential hypertension    GI bleeding    Recurrent   Hepatic encephalopathy    History of kidney stones    History of RSV infection    Liver cirrhosis secondary to NASH (HCC) 2004   Biopsy-proven   Obesity    Osteomyelitis (HCC)    Peripheral vascular disease (HCC)    Thrombocytopenia (HCC)    Type 2 diabetes mellitus (HCC)     Past Surgical History:  Procedure Laterality Date   ABDOMINAL HYSTERECTOMY     COLONOSCOPY     Cystoscopy with ureteral stent     ESOPHAGOGASTRODUODENOSCOPY     ESOPHAGOGASTRODUODENOSCOPY (EGD) WITH PROPOFOL N/A 05/20/2021   Procedure: ESOPHAGOGASTRODUODENOSCOPY (EGD) WITH PROPOFOL;  Surgeon: Corbin Ade, MD;  [facility];  Service: Endoscopy;  Laterality: N/A;  needs intubation   IR ANGIOGRAM FOLLOW UP STUDY  05/21/2021   IR ANGIOGRAM SELECTIVE EACH ADDITIONAL VESSEL  05/21/2021   IR EMBO VENOUS NOT HEMORR HEMANG  INC GUIDE ROADMAPPING  05/21/2021   IR US GUIDE VASC ACCESS RIGHT  05/21/2021   IR VENOGRAM RENAL UNI LEFT  05/21/2021   RADIOLOGY WITH ANESTHESIA N/A 05/21/2021   Procedure: IR WITH ANESTHESIA;  Surgeon: Radiologist, Medication, MD;  Location: [facility];  Service: Radiology;  Laterality: N/A;   Toe amputations Bilateral    TUBAL LIGATION      Family History  Problem Relation Age of Onset   Heart failure Mother    Hyperlipidemia Mother    Diabetes Mellitus II Mother    Heart failure Father    Hyperlipidemia Father     Social History:  reports that she has quit smoking. Her smoking use included  cigarettes. She has never used smokeless tobacco. She reports current alcohol use. She reports that she does not use drugs.  Allergies:  Allergies  Allergen Reactions   Chlorhexidine Rash   Tape Rash    Medications: I have reviewed the patient's current medications.  Results for orders placed or performed during the hospital encounter of 11/09/21 (from the past 48 hour(s))  CBC with Differential/Platelet     Status: Abnormal   Collection Time: 11/09/21  8:13 PM  Result Value Ref Range   WBC 1.5 (L) 4.0 - 10.5 K/uL   RBC 2.90 (L) 3.87 - 5.11 MIL/uL   Hemoglobin 8.5 (L) 12.0 - 15.0 g/dL   HCT 83.6 (L) 62.9 - 47.6 %   MCV 93.8 80.0 - 100.0 fL   MCH 29.3 26.0 - 34.0 pg   MCHC 31.3 30.0 - 36.0 g/dL   RDW 54.6 50.3 - 54.6 %   Platelets 57 (L) 150 - 400 K/uL   nRBC 0.0 0.0 - 0.2 %   Neutrophils Relative % 49 %   Neutro Abs 0.8 (L) 1.7 - 7.7 K/uL   Lymphocytes Relative 37 %   Lymphs Abs 0.6 (L) 0.7 - 4.0 K/uL   Monocytes Relative 8 %   Monocytes Absolute 0.1 0.1 - 1.0 K/uL   Eosinophils Relative 5 %  Eosinophils Absolute 0.1 0.0 - 0.5 K/uL   Basophils Relative 0 %   Basophils Absolute 0.0 0.0 - 0.1 K/uL   Immature Granulocytes 1 %   Abs Immature Granulocytes 0.01 0.00 - 0.07 K/uL   Reactive, Benign Lymphocytes PRESENT     Comment: Performed at [facility], [place] [address], [place], [place] [address]  Comprehensive metabolic panel     Status: Abnormal   Collection Time: 11/09/21  8:13 PM  Result Value Ref Range   Sodium 134 (L) 135 - 145 mmol/L   Potassium 3.9 3.5 - 5.1 mmol/L   Chloride 101 98 - 111 mmol/L   CO2 24 22 - 32 mmol/L   Glucose, Bld 605 (HH) 70 - 99 mg/dL    Comment: Glucose reference range applies only to samples taken after fasting for at least 8 hours. CRITICAL RESULT CALLED TO, READ BACK BY AND VERIFIED WITH:  JENEAN NASH RN 11/09/21 @ 2127 VS    BUN 19 8 - 23 mg/dL   Creatinine, Ser 0.89 0.44 - 1.00 mg/dL   Calcium 8.8 (L) 8.9 - 10.3  mg/dL   Total Protein 7.0 6.5 - 8.1 g/dL   Albumin 3.3 (L) 3.5 - 5.0 g/dL   AST 86 (H) 15 - 41 U/L   ALT 59 (H) 0 - 44 U/L   Alkaline Phosphatase 124 38 - 126 U/L   Total Bilirubin 0.9 0.3 - 1.2 mg/dL   GFR, Estimated >60 >60 mL/min    Comment: (NOTE) Calculated using the CKD-EPI Creatinine Equation (2021)    Anion gap 9 5 - 15    Comment: Performed at [facility], [place] [address] [place], [place] [address]  Sedimentation rate     Status: None   Collection Time: 11/09/21  8:13 PM  Result Value Ref Range   Sed Rate 21 0 - 22 mm/hr    Comment: Performed at [facility], [place] [address] [place], [place] [address]  C-reactive protein     Status: None   Collection Time: 11/09/21  8:13 PM  Result Value Ref Range   CRP 0.9 <1.0 mg/dL    Comment: Performed at [facility], [place] [address] [place],  [address]  Resp Panel by RT-PCR (Flu A&B, Covid) Nasopharyngeal Swab     Status: None   Collection Time: 11/09/21  9:19 PM   Specimen: Nasopharyngeal Swab; Nasopharyngeal(NP) swabs in vial transport medium  Result Value Ref Range   SARS Coronavirus 2 by RT PCR NEGATIVE NEGATIVE    Comment: (NOTE) SARS-CoV-2 target nucleic acids are NOT DETECTED.  The SARS-CoV-2 RNA is generally detectable in upper respiratory specimens during the acute phase of infection. The lowest concentration of SARS-CoV-2 viral copies this assay can detect is 138 copies/mL. A negative result does not preclude SARS-Cov-2 infection and should not be used as the sole basis for treatment or other patient management decisions. A negative result may occur with  improper specimen collection/handling, submission of specimen other than nasopharyngeal swab, presence of viral mutation(s) within the areas targeted by this assay, and inadequate number of viral copies(<138 copies/mL). A negative result must be combined with clinical observations, patient history, and  epidemiological information. The expected result is Negative.  Fact Sheet for Patients:  EntrepreneurPulse.com.au  Fact Sheet for Healthcare Providers:  IncredibleEmployment.be  This test is no t yet approved or cleared by the [place] FDA and  has been authorized for detection and/or diagnosis of SARS-CoV-2 by FDA  under an Emergency Use Authorization (EUA). This EUA will remain  in effect (meaning this test can be used) for the duration of the COVID-19 declaration under Section 564(b)(1) of the Act, 21 U.S.C.section 360bbb-3(b)(1), unless the authorization is terminated  or revoked sooner.       Influenza A by PCR NEGATIVE NEGATIVE   Influenza B by PCR NEGATIVE NEGATIVE    Comment: (NOTE) The Xpert Xpress SARS-CoV-2/FLU/RSV plus assay is intended as an aid in the diagnosis of influenza from Nasopharyngeal swab specimens and should not be used as a sole basis for treatment. Nasal washings and aspirates are unacceptable for Xpert Xpress SARS-CoV-2/FLU/RSV testing.  Fact Sheet for Patients: BloggerCourse.com  Fact Sheet for Healthcare Providers: SeriousBroker.it  This test is not yet approved or cleared by the [place] FDA and has been authorized for detection and/or diagnosis of SARS-CoV-2 by FDA under an Emergency Use Authorization (EUA). This EUA will remain in effect (meaning this test can be used) for the duration of the COVID-19 declaration under Section 564(b)(1) of the Act, 21 U.S.C. section 360bbb-3(b)(1), unless the authorization is terminated or revoked.  Performed at [facility], [address] [place], [place] [address]   POC CBG, ED     Status: Abnormal   Collection Time: 11/09/21 10:35 PM  Result Value Ref Range   Glucose-Capillary 484 (H) 70 - 99 mg/dL    Comment: Glucose reference range applies only to samples taken after fasting for at  least 8 hours.  POC CBG, ED     Status: Abnormal   Collection Time: 11/09/21 11:52 PM  Result Value Ref Range   Glucose-Capillary 396 (H) 70 - 99 mg/dL    Comment: Glucose reference range applies only to samples taken after fasting for at least 8 hours.  Glucose, capillary     Status: Abnormal   Collection Time: 11/10/21  2:29 AM  Result Value Ref Range   Glucose-Capillary 248 (H) 70 - 99 mg/dL    Comment: Glucose reference range applies only to samples taken after fasting for at least 8 hours.  Hemoglobin A1c     Status: Abnormal   Collection Time: 11/10/21  8:32 AM  Result Value Ref Range   Hgb A1c MFr Bld 9.4 (H) 4.8 - 5.6 %    Comment: (NOTE) Pre diabetes:          5.7%-6.4%  Diabetes:              >6.4%  Glycemic control for   <7.0% adults with diabetes    Mean Plasma Glucose 223.08 mg/dL    Comment: Performed at [facility], [address] [place], [place] [address]  Basic metabolic panel     Status: Abnormal   Collection Time: 11/10/21  8:32 AM  Result Value Ref Range   Sodium 138 135 - 145 mmol/L   Potassium 3.8 3.5 - 5.1 mmol/L   Chloride 108 98 - 111 mmol/L   CO2 25 22 - 32 mmol/L   Glucose, Bld 134 (H) 70 - 99 mg/dL    Comment: Glucose reference range applies only to samples taken after fasting for at least 8 hours.   BUN 18 8 - 23 mg/dL   Creatinine, Ser 3.90 0.44 - 1.00 mg/dL   Calcium 8.3 (L) 8.9 - 10.3 mg/dL   GFR, Estimated >30 >09 mL/min    Comment: (NOTE) Calculated using the CKD-EPI Creatinine Equation (2021)    Anion gap 5 5 - 15  Comment: Performed at [facility], [place] [address], [place], [place] [address]  CBC     Status: Abnormal   Collection Time: 11/10/21  8:32 AM  Result Value Ref Range   WBC 1.6 (L) 4.0 - 10.5 K/uL   RBC 2.60 (L) 3.87 - 5.11 MIL/uL   Hemoglobin 7.6 (L) 12.0 - 15.0 g/dL   HCT 24.3 (L) 36.0 - 46.0 %   MCV 93.5 80.0 - 100.0 fL   MCH 29.2 26.0 - 34.0 pg   MCHC 31.3 30.0 - 36.0 g/dL   RDW 13.9  11.5 - 15.5 %   Platelets 53 (L) 150 - 400 K/uL    Comment: SPECIMEN CHECKED FOR CLOTS Immature Platelet Fraction may be clinically indicated, consider ordering this additional test [id number] CONSISTENT WITH PREVIOUS RESULT    nRBC 0.0 0.0 - 0.2 %    Comment: Performed at [facility], [place] [address] [place], [place] [address]  Glucose, capillary     Status: Abnormal   Collection Time: 11/10/21  9:30 AM  Result Value Ref Range   Glucose-Capillary 142 (H) 70 - 99 mg/dL    Comment: Glucose reference range applies only to samples taken after fasting for at least 8 hours.  Glucose, capillary     Status: Abnormal   Collection Time: 11/10/21 12:16 PM  Result Value Ref Range   Glucose-Capillary 219 (H) 70 - 99 mg/dL    Comment: Glucose reference range applies only to samples taken after fasting for at least 8 hours.    MR FOOT LEFT W WO CONTRAST  Result Date: 11/10/2021 CLINICAL DATA:  Possible osteomyelitis.  Open wound EXAM: MRI OF THE LEFT FOREFOOT WITHOUT AND WITH CONTRAST TECHNIQUE: Multiplanar, multisequence MR imaging of the left foot was performed both before and after administration of intravenous contrast. CONTRAST:  41mL GADAVIST GADOBUTROL 1 MMOL/ML IV SOLN COMPARISON:  Left foot x-ray 11/09/2021 FINDINGS: Study is somewhat limited due to motion. Bones/Joint/Cartilage Previous amputation of the toes. There is cortical destruction at the distal first metatarsal with marked pulmonary edema and enhancement throughout most of the first metatarsal with sparing of the base, consistent with acute osteomyelitis. No definite osteomyelitis in the second through fifth metatarsals. Patchy mild bone marrow edema and enhancement visualized in the cuneiform bones, cuboid bone and navicular bone felt to be most likely reactive/degenerative in nature. Ligaments No significant findings. Muscles and Tendons Mild edema in the intrinsic musculature of the foot. No evidence of  tenosynovitis. Soft tissues Mild diffuse subcutaneous soft tissue swelling. Soft tissue swelling and enhancement most significant at the medial distal stump consistent with cellulitis. Soft tissue ulcer at the medial distal stump with tract extending to the distal first metatarsal. IMPRESSION: 1. Soft tissue ulceration at the distal medial foot stump with tract extending to the distal first metatarsal and surrounding cellulitis. 2. Acute osteomyelitis of the first metatarsal. 3. Other findings as described. Electronically Signed   By: Ofilia Neas M.D.   On: 11/10/2021 13:22   DG Foot Complete Left  Result Date: 11/09/2021 Please see detailed radiograph report in office note.   Blood pressure (!) 111/49, pulse 73, temperature 98 F (36.7 C), temperature source Oral, resp. rate 18, height 5\' 8"  (1.727 m), weight 86.5 kg, SpO2 97 %.  Vitals:   11/10/21 0944 11/10/21 1359  BP: (!) 109/45 (!) 111/49  Pulse: 82 73  Resp: 18 18  Temp: 97.8 F (36.6 C) 98 F (36.7 C)  SpO2: 98% 97%  Objective: AAO x3  DP/PT pulses palpable bilaterally, CRT less than 3 seconds Ulceration plantar medial aspect of the left foot is again worsening today measuring 2 x 1.5 x 1.5 cm and there is probing down to the first metatarsal.  Edema and erythema present to the distal aspect the foot.  There is no fluctuation crepitation.  There is no purulence. No pain with calf compression, swelling, warmth, erythema Assessment/Plan:  Left foot osteomyelitis -Imaging: Studies independently reviewed -Antibiotics: continue empiric -WB Status: heel WB LLE -Wound Care: Betadine WTD dressing - change daily. -Surgical Plan: plan for operative debridement this Friday with possible metatarsal resection  Evelina Bucy 11/10/2021, 4:57 PM   Best available via secure chat for questions or concerns.

## 2021-11-10 NOTE — Progress Notes (Addendum)
Inpatient Diabetes Program Recommendations  AACE/ADA: New Consensus Statement on Inpatient Glycemic Control (2015)  Target Ranges:  Prepandial:   less than 140 mg/dL      Peak postprandial:   less than 180 mg/dL (1-2 hours)      Critically ill patients:  140 - 180 mg/dL    Latest Reference Range & Units 11/09/21 20:13  Glucose 70 - 99 mg/dL 605 (HH)    Latest Reference Range & Units 11/09/21 22:35 11/09/21 23:52 11/10/21 02:29  Glucose-Capillary 70 - 99 mg/dL 484 (H)  10 units Novolog  396 (H) 248 (H)    Admit with: Chronic osteomyelitis involving ankle and foot, left   History: DM  Home DM Meds: Novolog 6 units TID       Metformin 500 mg BID       Toujeo 65 units BID  Current Orders: Novolog Moderate Correction Scale/ SSI (0-15 units) TID AC + HS (Signed and Held)      Metformin 500 mg BID (Signed and Held)   Per MD notes, pt ran out of insulin 1 week prior to admission--was waiting for insurance to approve her Rx--not checking CBGs   MD- Note there are signed and held orders for Novlog SSI and Metformin  Per record review, pt also taking Toujeo basal insulin at home  Please consider starting weight based dosing of Semglee: Semglee 25 units Daily (0.3 units/kg based on weight of 86 kg)   Addendum 12:25pm--Met w/ pt at bedside today.  She had just returned from MRI and was very drowsy and unable to hold a meaningful conversation with me.  Could not verify with pt her home DM medication regimen nor if she had managed to get her insulins filled prior to admission.  I have called pt's PCP office--Dr. Elvina Mattes with [facility] to try to ascertain home DM meds.  Left voicemail with Dr. Junius Finner RN.  Awaiting call back.    --Will follow patient during hospitalization--  Wyn Quaker RN, MSN, CDE Diabetes Coordinator Inpatient Glycemic Control Team Team Pager: [phone] (8a-5p)

## 2021-11-10 NOTE — Progress Notes (Signed)
Pharmacy Antibiotic Note  Ariel Arnold is a 65 y.o. female admitted on 11/09/2021 with  osteomyelitis .  Pharmacy has been consulted for Vancomyin dosing.  Plan: Vancomycin 1500mg  IV q24h to target AUC 400-550 Add Cefepime 2gm q8 Check Vancomycin levels at steady state Podiatry to eval for debridement vs amputation  Height: 5\' 8"  (172.7 cm) Weight: 86.5 kg (190 lb 11.2 oz) IBW/kg (Calculated) : 63.9  Temp (24hrs), Avg:97.9 F (36.6 C), Min:97.7 F (36.5 C), Max:98.2 F (36.8 C)  Recent Labs  Lab 11/09/21 2013  WBC 1.5*  CREATININE 0.89     Estimated Creatinine Clearance: 70.6 mL/min (by C-G formula based on SCr of 0.89 mg/dL).    Allergies  Allergen Reactions   Chlorhexidine Rash   Tape Rash    Antimicrobials this admission: 11/22 Vancomycin >>   Dose adjustments this admission:  Microbiology results: no cx ordered   Thank you for allowing pharmacy to be a part of this patient's care.  2014 PharmD 11/10/2021 7:52 AM

## 2021-11-10 NOTE — Plan of Care (Signed)
  Problem: Clinical Measurements: Goal: Ability to maintain clinical measurements within normal limits will improve Outcome: Progressing   Problem: Clinical Measurements: Goal: Will remain free from infection Outcome: Progressing   Problem: Activity: Goal: Risk for activity intolerance will decrease Outcome: Progressing   

## 2021-11-10 NOTE — Progress Notes (Signed)
Subjective: 65 year old female presents the office today for follow-up evaluation of left foot wound.  She presents today with her daughter.  Unfortunately her surgery was canceled due to the awaiting clearance.  She has a 1 out of town.  She presents today for further evaluation of the wound.  Overall she said that she has not been feeling well today.  Also notes increased swelling redness to the foot.  Denies fevers or chills.  No other concerns today.   Per care everywhere:  -She is on vancomycin and Cipro.  Vascular studies: ABI's [11/18/2020] (R) 1.24 (L) 1.33 (-) DVT [10/14/2020]  Imaging: XR (L) Foot 08/27/2021  FINDINGS: Prior forefoot amputations. There is a soft tissue ulcer along the medial distal stump. There is lucency and irregularity of the residual distal first metatarsal. There is diffuse soft tissue swelling of the foot.   IMPRESSION: Findings are concerning for osteomyelitis of the residual first metatarsal with adjacent soft tissue ulcer. MRI (L) Foot W\Wo Contrast 08/27/2021 1532  IMPRESSION: 1. Soft tissue ulceration at the medial aspect of the distal stump overlying the residual first metatarsal. Acute osteomyelitis throughout the residual first metatarsal extending to the first metatarsal base. 2. Marrow edema throughout the midfoot which is most pronounced within the navicular and cuboid. Subtle patchy intermediate T1 signal changes at these locations without discrete erosion or marrow replacement. Findings are favored to represent reactive osteitis. Early acute osteomyelitis is not excluded. 3. No organized or drainable fluid collections.   Previous treatment: IV antibiotics, dressing changes, biologic dressings (dermagraft), the patient was referred for HBO therapy (January 2022) but did not follow through with treatment. The patient's treatment options remain limited at this time. She is tolerating dressing changes well she was referred to [facility] wound care center but was unable to proceed with HBO secondary to insurance conflicts.   Objective: AAO x3, presents with daughter DP/PT pulses palpable bilaterally, CRT less than 3 seconds Ulceration plantar medial aspect of the left foot is again worsening today measuring 2 x 1.5 x 1.5 cm and there is probing down to the first metatarsal.  Edema and erythema present to the distal aspect the foot.  There is no fluctuation crepitation.  There is no purulence. No pain with calf compression, swelling, warmth, erythema   Assessment: Ulceration left foot, nonhealing  Plan: X-rays obtained reviewed.  Likely chronic osteolysis of the ring and metatarsals however there does appear to be acute changes present the first metatarsal.  I discussed findings with her as well as her daughter.  Patient overall does not feel well today.  Given the infection I recommended her to proceed to the emergency department for further evaluation and treatment likely admission to the hospital.  She is going to need IV antibiotics, MRI.  Discussed with Dr. Samuella Cota who is on-call.  Notified [facility] of her arrival.  Vivi Barrack [facility]

## 2021-11-10 NOTE — Progress Notes (Signed)
PROGRESS NOTE    Ariel Arnold  PIR:[id number] DOB: 11/13/1956 DOA: 11/09/2021 PCP: Joaquin Courts, DO    Brief Narrative:  Ariel Arnold is a 65 year old female with past medical history significant for type 2 diabetes mellitus, recurrent osteomyelitis of left foot s/p amputation with ongoing wound, essential hypertension, NASH cirrhosis, peripheral vascular disease, history of AVMs with prior GI bleeding, pancytopenia, depression who presents to [facility] ED for worsening left foot swelling, redness and drainage.  Patient was seen by her podiatrist earlier in the day with recommendations of ED evaluation for hospital admission.  Patient has been on multiple rounds of antibiotics for osteomyelitis and finished IV vancomycin about 2-3 weeks ago and continued on oral ciprofloxacin until last week.  She denies any fevers or chills, no shortness of breath, no nausea/vomiting/diarrhea.    Patient also reports she ran out of her insulin over a week ago and is waiting for her insurance to approve her insulin at the pharmacy for her to pick up.  In the ED, temperature 97.7 F, HR 86, RR 16, BP 149/63, SPO2 100% on room air.  Sodium 134, potassium 3.9, chloride 101, CO2 24, glucose 65, anion gap 9, BUN 19, creatinine 0.89.  AST 86, ALT 59.  WBC 1.5, hemoglobin 8.5, platelets 57.  CRP 0.9.  Cova-19 PCR negative.  Influenza A/B PCR negative.  Left foot x-ray with soft tissue ulcer along the medial distal stump, lucency/irregularity of the residual distal first metatarsal, diffuse soft tissue swelling of the foot.  Podiatry was consulted.  Hospital service consulted for further evaluation and management of acute diabetic foot infection with likely concern of acute osteomyelitis.   Assessment & Plan:   Principal Problem:   Chronic osteomyelitis involving ankle and foot, left (HCC) Active Problems:   Liver cirrhosis secondary to NASH (HCC)   Depressive disorder   Uncontrolled type 2 diabetes mellitus  with hyperglycemia (HCC)   Essential hypertension   Pancytopenia (HCC)   Acute on chronic osteomyelitis left foot Diabetic foot infection Patient presenting to ED by direction of her podiatrist for worsening left foot wound concerning for acute on chronic osteomyelitis.  Patient completed outpatient course of IV vancomycin followed by oral ciprofloxacin without improvement.  X-ray left foot with soft tissue ulcer medial distal stump with lucency irregularity of the residual distal first metatarsal and diffuse soft tissue swelling of the foot.  MRI left foot with and without contrast with soft tissue ulceration distal medial foot stump with tract extending to the distal first metatarsal and surrounding cellulitis, acute osteomyelitis of the first metatarsal. --Podiatry following, appreciate assistance --Vancomycin, pharmacy consulted for dosing/monitoring --Cefepime 2 g IV every 8 hours --Heel weightbearing left lower extremity --Betadine WTD dressing change daily  Type 2 diabetes mellitus, with hyperglycemia Hemoglobin A1c 9.4, poorly controlled.  Patient reports has been out of her insulin; at baseline on Toujeo 65 units every qAM/qPM, metformin 500 mg p.o. twice daily. --Diabetic educator following, appreciate assistance --Semglee 25 units subcutaneously daily --Moderate SSI for coverage --CBGs qAC  Essential hypertension --Carvedilol 6.25 mg p.o. twice daily --Spironolactone 50 mg p.o. daily  Hx GI bleed/AVM Iron deficiency anemia --Hgb 8.5, stable --Ferrous sulfate 325 mg p.o. daily --CBC in am  Depression/anxiety: Sertraline 150 mg p.o. daily  Neuropathy: Gabapentin 6 mg p.o. 3 times daily  HLD: Pravastatin 20 mg p.o. nightly  Nash cirrhosis --Spironolactone 50 mg p.o. every morning     DVT prophylaxis: SCDs Start: 11/10/21 0806   Code Status:  Full Code Family Communication: No family present at bedside this morning  Disposition Plan:  Level of care:  Med-Surg Status is: Inpatient  Remains inpatient appropriate because: Pending surgical intervention per podiatry, likely 11/12/2021    Consultants:  Podiatry, Dr. March Rummage  Procedures:  None  Antimicrobials:  Vancomycin 11/22>> Cefepime 11/23>>    Subjective: Patient seen examined bedside, resting comfortably.  No current complaints.  Awaiting MRI this morning with podiatry planning on surgical intervention on Friday.  Pain controlled.  No other questions or concerns at this time.  Denies headache, no fever/chills/night sweats, no nausea/vomiting/diarrhea, no chest pain, no palpitations, no shortness of breath, no abdominal pain, no weakness, no fatigue, no paresthesias.  No acute events overnight per nursing staff.  Objective: Vitals:   11/10/21 0058 11/10/21 0513 11/10/21 0944 11/10/21 1359  BP: (!) 129/54 (!) 114/43 (!) 109/45 (!) 111/49  Pulse: 87 88 82 73  Resp: 18 16 18 18   Temp: 98.1 F (36.7 C) 98.2 F (36.8 C) 97.8 F (36.6 C) 98 F (36.7 C)  TempSrc: Oral Oral Oral Oral  SpO2: 100% 96% 98% 97%  Weight:      Height:        Intake/Output Summary (Last 24 hours) at 11/10/2021 1806 Last data filed at 11/10/2021 1737 Gross per 24 hour  Intake 378.83 ml  Output 300 ml  Net 78.83 ml   Filed Weights   11/09/21 1945 11/10/21 0046  Weight: 86.2 kg 86.5 kg    Examination:  General exam: Appears calm and comfortable, appears older than stated age Respiratory system: Clear to auscultation. Respiratory effort normal. Cardiovascular system: S1 & S2 heard, RRR. No JVD, murmurs, rubs, gallops or clicks. No pedal edema. Gastrointestinal system: Abdomen is nondistended, soft and nontender. No organomegaly or masses felt. Normal bowel sounds heard. Central nervous system: Alert and oriented. No focal neurological deficits. Extremities: Symmetric 5 x 5 power. Skin: Left foot with dressing in place, wound noted to left first metatarsal region, otherwise no rashes, lesions  or ulcers Psychiatry: Judgement and insight appear normal. Mood & affect appropriate.     Data Reviewed: I have personally reviewed following labs and imaging studies  CBC: Recent Labs  Lab 11/09/21 2013 11/10/21 0832  WBC 1.5* 1.6*  NEUTROABS 0.8*  --   HGB 8.5* 7.6*  HCT 27.2* 24.3*  MCV 93.8 93.5  PLT 57* 53*   Basic Metabolic Panel: Recent Labs  Lab 11/09/21 2013 11/10/21 0832  NA 134* 138  K 3.9 3.8  CL 101 108  CO2 24 25  GLUCOSE 605* 134*  BUN 19 18  CREATININE 0.89 0.71  CALCIUM 8.8* 8.3*   GFR: Estimated Creatinine Clearance: 78.5 mL/min (by C-G formula based on SCr of 0.71 mg/dL). Liver Function Tests: Recent Labs  Lab 11/09/21 2013  AST 86*  ALT 59*  ALKPHOS 124  BILITOT 0.9  PROT 7.0  ALBUMIN 3.3*   No results for input(s): LIPASE, AMYLASE in the last 168 hours. No results for input(s): AMMONIA in the last 168 hours. Coagulation Profile: No results for input(s): INR, PROTIME in the last 168 hours. Cardiac Enzymes: No results for input(s): CKTOTAL, CKMB, CKMBINDEX, TROPONINI in the last 168 hours. BNP (last 3 results) No results for input(s): PROBNP in the last 8760 hours. HbA1C: Recent Labs    11/10/21 0832  HGBA1C 9.4*   CBG: Recent Labs  Lab 11/09/21 2352 11/10/21 0229 11/10/21 0930 11/10/21 1216 11/10/21 1713  GLUCAP 396* 248* 142* 219* 123*  Lipid Profile: No results for input(s): CHOL, HDL, LDLCALC, TRIG, CHOLHDL, LDLDIRECT in the last 72 hours. Thyroid Function Tests: No results for input(s): TSH, T4TOTAL, FREET4, T3FREE, THYROIDAB in the last 72 hours. Anemia Panel: No results for input(s): VITAMINB12, FOLATE, FERRITIN, TIBC, IRON, RETICCTPCT in the last 72 hours. Sepsis Labs: No results for input(s): PROCALCITON, LATICACIDVEN in the last 168 hours.  Recent Results (from the past 240 hour(s))  Resp Panel by RT-PCR (Flu A&B, Covid) Nasopharyngeal Swab     Status: None   Collection Time: 11/09/21  9:19 PM   Specimen:  Nasopharyngeal Swab; Nasopharyngeal(NP) swabs in vial transport medium  Result Value Ref Range Status   SARS Coronavirus 2 by RT PCR NEGATIVE NEGATIVE Final    Comment: (NOTE) SARS-CoV-2 target nucleic acids are NOT DETECTED.  The SARS-CoV-2 RNA is generally detectable in upper respiratory specimens during the acute phase of infection. The lowest concentration of SARS-CoV-2 viral copies this assay can detect is 138 copies/mL. A negative result does not preclude SARS-Cov-2 infection and should not be used as the sole basis for treatment or other patient management decisions. A negative result may occur with  improper specimen collection/handling, submission of specimen other than nasopharyngeal swab, presence of viral mutation(s) within the areas targeted by this assay, and inadequate number of viral copies(<138 copies/mL). A negative result must be combined with clinical observations, patient history, and epidemiological information. The expected result is Negative.  Fact Sheet for Patients:  EntrepreneurPulse.com.au  Fact Sheet for Healthcare Providers:  IncredibleEmployment.be  This test is no t yet approved or cleared by the [place] FDA and  has been authorized for detection and/or diagnosis of SARS-CoV-2 by FDA under an Emergency Use Authorization (EUA). This EUA will remain  in effect (meaning this test can be used) for the duration of the COVID-19 declaration under Section 564(b)(1) of the Act, 21 U.S.C.section 360bbb-3(b)(1), unless the authorization is terminated  or revoked sooner.       Influenza A by PCR NEGATIVE NEGATIVE Final   Influenza B by PCR NEGATIVE NEGATIVE Final    Comment: (NOTE) The Xpert Xpress SARS-CoV-2/FLU/RSV plus assay is intended as an aid in the diagnosis of influenza from Nasopharyngeal swab specimens and should not be used as a sole basis for treatment. Nasal washings and aspirates are unacceptable for  Xpert Xpress SARS-CoV-2/FLU/RSV testing.  Fact Sheet for Patients: EntrepreneurPulse.com.au  Fact Sheet for Healthcare Providers: IncredibleEmployment.be  This test is not yet approved or cleared by the [place] FDA and has been authorized for detection and/or diagnosis of SARS-CoV-2 by FDA under an Emergency Use Authorization (EUA). This EUA will remain in effect (meaning this test can be used) for the duration of the COVID-19 declaration under Section 564(b)(1) of the Act, 21 U.S.C. section 360bbb-3(b)(1), unless the authorization is terminated or revoked.  Performed at [facility], [place] [address] [place], [place] [address]          Radiology Studies: MR FOOT LEFT W WO CONTRAST  Result Date: 11/10/2021 CLINICAL DATA:  Possible osteomyelitis.  Open wound EXAM: MRI OF THE LEFT FOREFOOT WITHOUT AND WITH CONTRAST TECHNIQUE: Multiplanar, multisequence MR imaging of the left foot was performed both before and after administration of intravenous contrast. CONTRAST:  47mL GADAVIST GADOBUTROL 1 MMOL/ML IV SOLN COMPARISON:  Left foot x-ray 11/09/2021 FINDINGS: Study is somewhat limited due to motion. Bones/Joint/Cartilage Previous amputation of the toes. There is cortical destruction at the distal first metatarsal with marked pulmonary edema and enhancement  throughout most of the first metatarsal with sparing of the base, consistent with acute osteomyelitis. No definite osteomyelitis in the second through fifth metatarsals. Patchy mild bone marrow edema and enhancement visualized in the cuneiform bones, cuboid bone and navicular bone felt to be most likely reactive/degenerative in nature. Ligaments No significant findings. Muscles and Tendons Mild edema in the intrinsic musculature of the foot. No evidence of tenosynovitis. Soft tissues Mild diffuse subcutaneous soft tissue swelling. Soft tissue swelling and enhancement most  significant at the medial distal stump consistent with cellulitis. Soft tissue ulcer at the medial distal stump with tract extending to the distal first metatarsal. IMPRESSION: 1. Soft tissue ulceration at the distal medial foot stump with tract extending to the distal first metatarsal and surrounding cellulitis. 2. Acute osteomyelitis of the first metatarsal. 3. Other findings as described. Electronically Signed   By: Ofilia Neas M.D.   On: 11/10/2021 13:22   DG Foot Complete Left  Result Date: 11/09/2021 Please see detailed radiograph report in office note.       Scheduled Meds:  carvedilol  6.25 mg Oral BID   ferrous sulfate  325 mg Oral Q breakfast   folic acid  1 mg Oral BID   gabapentin  600 mg Oral TID   insulin aspart  0-15 Units Subcutaneous TID WC   insulin aspart  0-5 Units Subcutaneous QHS   insulin glargine-yfgn  25 Units Subcutaneous Daily   metFORMIN  500 mg Oral BID WC   sertraline  150 mg Oral Daily   spironolactone  50 mg Oral q morning   Continuous Infusions:  ceFEPime (MAXIPIME) IV 2 g (11/10/21 1724)   lactated ringers 75 mL/hr at 11/10/21 0934   vancomycin       LOS: 1 day    Time spent: 42 minutes spent on chart review, discussion with nursing staff, consultants, updating family and interview/physical exam; more than 50% of that time was spent in counseling and/or coordination of care.    Rosana Farnell J [place] DO [facility] Available via Epic secure chat 7am-7pm After these hours, please refer to coverage provider listed on [email or web address] 11/10/2021, 6:06 PM

## 2021-11-11 ENCOUNTER — Ambulatory Visit: Payer: Self-pay | Admitting: Podiatry

## 2021-11-11 DIAGNOSIS — M86672 Other chronic osteomyelitis, left ankle and foot: Secondary | ICD-10-CM | POA: Diagnosis not present

## 2021-11-11 LAB — CBC
HCT: 24.9 % — ABNORMAL LOW (ref 36.0–46.0)
Hemoglobin: 7.8 g/dL — ABNORMAL LOW (ref 12.0–15.0)
MCH: 29.4 pg (ref 26.0–34.0)
MCHC: 31.3 g/dL (ref 30.0–36.0)
MCV: 94 fL (ref 80.0–100.0)
Platelets: 57 10*3/uL — ABNORMAL LOW (ref 150–400)
RBC: 2.65 MIL/uL — ABNORMAL LOW (ref 3.87–5.11)
RDW: 14 % (ref 11.5–15.5)
WBC: 1.8 10*3/uL — ABNORMAL LOW (ref 4.0–10.5)
nRBC: 0 % (ref 0.0–0.2)

## 2021-11-11 LAB — GLUCOSE, CAPILLARY
Glucose-Capillary: 326 mg/dL — ABNORMAL HIGH (ref 70–99)
Glucose-Capillary: 368 mg/dL — ABNORMAL HIGH (ref 70–99)
Glucose-Capillary: 253 mg/dL — ABNORMAL HIGH (ref 70–99)
Glucose-Capillary: 224 mg/dL — ABNORMAL HIGH (ref 70–99)

## 2021-11-11 LAB — BASIC METABOLIC PANEL
Anion gap: 8 (ref 5–15)
BUN: 21 mg/dL (ref 8–23)
CO2: 22 mmol/L (ref 22–32)
Calcium: 8.3 mg/dL — ABNORMAL LOW (ref 8.9–10.3)
Chloride: 106 mmol/L (ref 98–111)
Creatinine, Ser: 0.79 mg/dL (ref 0.44–1.00)
GFR, Estimated: >60 mL/min (ref >60)
Glucose, Bld: 288 mg/dL — ABNORMAL HIGH (ref 70–99)
Potassium: 3.8 mmol/L (ref 3.5–5.1)
Sodium: 136 mmol/L (ref 135–145)

## 2021-11-11 NOTE — Progress Notes (Signed)
PROGRESS NOTE    Ariel Arnold  RCV:[id number] DOB: 12-22-1955 DOA: 11/09/2021 PCP: Joaquin Courts, DO    Brief Narrative:  Ariel Arnold is a 65 year old female with past medical history significant for type 2 diabetes mellitus, recurrent osteomyelitis of left foot s/p amputation with ongoing wound, essential hypertension, NASH cirrhosis, peripheral vascular disease, history of AVMs with prior GI bleeding, pancytopenia, depression who presents to [facility] ED for worsening left foot swelling, redness and drainage.  Patient was seen by her podiatrist earlier in the day with recommendations of ED evaluation for hospital admission.  Patient has been on multiple rounds of antibiotics for osteomyelitis and finished IV vancomycin about 2-3 weeks ago and continued on oral ciprofloxacin until last week.  She denies any fevers or chills, no shortness of breath, no nausea/vomiting/diarrhea.    Patient also reports she ran out of her insulin over a week ago and is waiting for her insurance to approve her insulin at the pharmacy for her to pick up.  In the ED, temperature 97.7 F, HR 86, RR 16, BP 149/63, SPO2 100% on room air.  Sodium 134, potassium 3.9, chloride 101, CO2 24, glucose 65, anion gap 9, BUN 19, creatinine 0.89.  AST 86, ALT 59.  WBC 1.5, hemoglobin 8.5, platelets 57.  CRP 0.9.  Cova-19 PCR negative.  Influenza A/B PCR negative.  Left foot x-ray with soft tissue ulcer along the medial distal stump, lucency/irregularity of the residual distal first metatarsal, diffuse soft tissue swelling of the foot.  Podiatry was consulted.  Hospital service consulted for further evaluation and management of acute diabetic foot infection with likely concern of acute osteomyelitis.   Assessment & Plan:   Principal Problem:   Chronic osteomyelitis involving ankle and foot, left (HCC) Active Problems:   Liver cirrhosis secondary to NASH (HCC)   Depressive disorder   Uncontrolled type 2 diabetes mellitus  with hyperglycemia (HCC)   Essential hypertension   Pancytopenia (HCC)   Acute on chronic osteomyelitis left foot Diabetic foot infection Patient presenting to ED by direction of her podiatrist for worsening left foot wound concerning for acute on chronic osteomyelitis.  Patient completed outpatient course of IV vancomycin followed by oral ciprofloxacin without improvement.  X-ray left foot with soft tissue ulcer medial distal stump with lucency irregularity of the residual distal first metatarsal and diffuse soft tissue swelling of the foot.  MRI left foot with and without contrast with soft tissue ulceration distal medial foot stump with tract extending to the distal first metatarsal and surrounding cellulitis, acute osteomyelitis of the first metatarsal. --Podiatry following, appreciate assistance --Vancomycin, pharmacy consulted for dosing/monitoring --Cefepime 2 g IV every 8 hours --Heel weightbearing left lower extremity --Betadine WTD dressing change daily --N.p.o. after midnight for planned surgical invention on 11/25  Type 2 diabetes mellitus, with hyperglycemia Hemoglobin A1c 9.4, poorly controlled.  Patient reports has been out of her insulin; at baseline on Toujeo 65 units every qAM/qPM, metformin 500 mg p.o. twice daily. --Diabetic educator following, appreciate assistance --Semglee 25 units subcutaneously daily --Moderate SSI for coverage --CBGs qAC/HS  Essential hypertension --Carvedilol 6.25 mg p.o. twice daily --Spironolactone 50 mg p.o. daily  Hx GI bleed/AVM Iron deficiency anemia --Hgb 8.5>7.6>7.8, stable --Ferrous sulfate 325 mg p.o. daily --CBC daily  Depression/anxiety: Sertraline 150 mg p.o. daily  Neuropathy: Gabapentin 600 mg p.o. TID  HLD: Pravastatin 20 mg p.o. nightly  Nash cirrhosis --Spironolactone 50 mg p.o. every morning     DVT prophylaxis: SCDs Start:  11/10/21 0806   Code Status: Full Code Family Communication: No family present at  bedside this morning  Disposition Plan:  Level of care: Med-Surg Status is: Inpatient  Remains inpatient appropriate because: Pending surgical intervention per podiatry on11/25/2022    Consultants:  Podiatry, Dr. March Rummage  Procedures:  None  Antimicrobials:  Vancomycin 11/22>> Cefepime 11/23>>    Subjective: Patient seen examined bedside, resting comfortably.  No current complaints.  Eating breakfast.  Plan surgery tomorrow for acute osteomyelitis left foot.  Pain controlled.  No other questions or concerns at this time.  Denies headache, no fever/chills/night sweats, no nausea/vomiting/diarrhea, no chest pain, no palpitations, no shortness of breath, no abdominal pain, no weakness, no fatigue, no paresthesias.  No acute events overnight per nursing staff.  Objective: Vitals:   11/10/21 0944 11/10/21 1359 11/10/21 2211 11/11/21 0500  BP: (!) 109/45 (!) 111/49 (!) 122/52 107/76  Pulse: 82 73 78 65  Resp: 18 18 16 14   Temp: 97.8 F (36.6 C) 98 F (36.7 C) 97.9 F (36.6 C) (!) 97.4 F (36.3 C)  TempSrc: Oral Oral Oral Oral  SpO2: 98% 97% 100% 100%  Weight:      Height:        Intake/Output Summary (Last 24 hours) at 11/11/2021 1201 Last data filed at 11/11/2021 1153 Gross per 24 hour  Intake 1444.92 ml  Output 300 ml  Net 1144.92 ml   Filed Weights   11/09/21 1945 11/10/21 0046  Weight: 86.2 kg 86.5 kg    Examination:  General exam: Appears calm and comfortable, appears older than stated age Respiratory system: Clear to auscultation. Respiratory effort normal. Cardiovascular system: S1 & S2 heard, RRR. No JVD, murmurs, rubs, gallops or clicks. No pedal edema. Gastrointestinal system: Abdomen is nondistended, soft and nontender. No organomegaly or masses felt. Normal bowel sounds heard. Central nervous system: Alert and oriented. No focal neurological deficits. Extremities: Symmetric 5 x 5 power. Skin: Left foot with dressing in place, wound noted to left first  metatarsal region, otherwise no rashes, lesions or ulcers Psychiatry: Judgement and insight appear normal. Mood & affect appropriate.     Data Reviewed: I have personally reviewed following labs and imaging studies  CBC: Recent Labs  Lab 11/09/21 2013 11/10/21 0832 11/11/21 0330  WBC 1.5* 1.6* 1.8*  NEUTROABS 0.8*  --   --   HGB 8.5* 7.6* 7.8*  HCT 27.2* 24.3* 24.9*  MCV 93.8 93.5 94.0  PLT 57* 53* 57*   Basic Metabolic Panel: Recent Labs  Lab 11/09/21 2013 11/10/21 0832 11/11/21 0330  NA 134* 138 136  K 3.9 3.8 3.8  CL 101 108 106  CO2 24 25 22   GLUCOSE 605* 134* 288*  BUN 19 18 21   CREATININE 0.89 0.71 0.79  CALCIUM 8.8* 8.3* 8.3*   GFR: Estimated Creatinine Clearance: 78.5 mL/min (by C-G formula based on SCr of 0.79 mg/dL). Liver Function Tests: Recent Labs  Lab 11/09/21 2013  AST 86*  ALT 59*  ALKPHOS 124  BILITOT 0.9  PROT 7.0  ALBUMIN 3.3*   No results for input(s): LIPASE, AMYLASE in the last 168 hours. No results for input(s): AMMONIA in the last 168 hours. Coagulation Profile: No results for input(s): INR, PROTIME in the last 168 hours. Cardiac Enzymes: No results for input(s): CKTOTAL, CKMB, CKMBINDEX, TROPONINI in the last 168 hours. BNP (last 3 results) No results for input(s): PROBNP in the last 8760 hours. HbA1C: Recent Labs    11/10/21 0832  HGBA1C 9.4*  CBG: Recent Labs  Lab 11/10/21 0930 11/10/21 1216 11/10/21 1713 11/10/21 2235 11/11/21 0736  GLUCAP 142* 219* 123* 242* 326*   Lipid Profile: No results for input(s): CHOL, HDL, LDLCALC, TRIG, CHOLHDL, LDLDIRECT in the last 72 hours. Thyroid Function Tests: No results for input(s): TSH, T4TOTAL, FREET4, T3FREE, THYROIDAB in the last 72 hours. Anemia Panel: No results for input(s): VITAMINB12, FOLATE, FERRITIN, TIBC, IRON, RETICCTPCT in the last 72 hours. Sepsis Labs: No results for input(s): PROCALCITON, LATICACIDVEN in the last 168 hours.  Recent Results (from the past  240 hour(s))  Resp Panel by RT-PCR (Flu A&B, Covid) Nasopharyngeal Swab     Status: None   Collection Time: 11/09/21  9:19 PM   Specimen: Nasopharyngeal Swab; Nasopharyngeal(NP) swabs in vial transport medium  Result Value Ref Range Status   SARS Coronavirus 2 by RT PCR NEGATIVE NEGATIVE Final    Comment: (NOTE) SARS-CoV-2 target nucleic acids are NOT DETECTED.  The SARS-CoV-2 RNA is generally detectable in upper respiratory specimens during the acute phase of infection. The lowest concentration of SARS-CoV-2 viral copies this assay can detect is 138 copies/mL. A negative result does not preclude SARS-Cov-2 infection and should not be used as the sole basis for treatment or other patient management decisions. A negative result may occur with  improper specimen collection/handling, submission of specimen other than nasopharyngeal swab, presence of viral mutation(s) within the areas targeted by this assay, and inadequate number of viral copies(<138 copies/mL). A negative result must be combined with clinical observations, patient history, and epidemiological information. The expected result is Negative.  Fact Sheet for Patients:  EntrepreneurPulse.com.au  Fact Sheet for Healthcare Providers:  IncredibleEmployment.be  This test is no t yet approved or cleared by the [place] FDA and  has been authorized for detection and/or diagnosis of SARS-CoV-2 by FDA under an Emergency Use Authorization (EUA). This EUA Ariel remain  in effect (meaning this test can be used) for the duration of the COVID-19 declaration under Section 564(b)(1) of the Act, 21 U.S.C.section 360bbb-3(b)(1), unless the authorization is terminated  or revoked sooner.       Influenza A by PCR NEGATIVE NEGATIVE Final   Influenza B by PCR NEGATIVE NEGATIVE Final    Comment: (NOTE) The Xpert Xpress SARS-CoV-2/FLU/RSV plus assay is intended as an aid in the diagnosis of influenza  from Nasopharyngeal swab specimens and should not be used as a sole basis for treatment. Nasal washings and aspirates are unacceptable for Xpert Xpress SARS-CoV-2/FLU/RSV testing.  Fact Sheet for Patients: EntrepreneurPulse.com.au  Fact Sheet for Healthcare Providers: IncredibleEmployment.be  This test is not yet approved or cleared by the [place] FDA and has been authorized for detection and/or diagnosis of SARS-CoV-2 by FDA under an Emergency Use Authorization (EUA). This EUA Ariel remain in effect (meaning this test can be used) for the duration of the COVID-19 declaration under Section 564(b)(1) of the Act, 21 U.S.C. section 360bbb-3(b)(1), unless the authorization is terminated or revoked.  Performed at [facility], [place] [address], [place], [place] [address]          Radiology Studies: MR FOOT LEFT W WO CONTRAST  Result Date: 11/10/2021 CLINICAL DATA:  Possible osteomyelitis.  Open wound EXAM: MRI OF THE LEFT FOREFOOT WITHOUT AND WITH CONTRAST TECHNIQUE: Multiplanar, multisequence MR imaging of the left foot was performed both before and after administration of intravenous contrast. CONTRAST:  50mL GADAVIST GADOBUTROL 1 MMOL/ML IV SOLN COMPARISON:  Left foot x-ray 11/09/2021 FINDINGS: Study is somewhat limited  due to motion. Bones/Joint/Cartilage Previous amputation of the toes. There is cortical destruction at the distal first metatarsal with marked pulmonary edema and enhancement throughout most of the first metatarsal with sparing of the base, consistent with acute osteomyelitis. No definite osteomyelitis in the second through fifth metatarsals. Patchy mild bone marrow edema and enhancement visualized in the cuneiform bones, cuboid bone and navicular bone felt to be most likely reactive/degenerative in nature. Ligaments No significant findings. Muscles and Tendons Mild edema in the intrinsic musculature of the foot. No  evidence of tenosynovitis. Soft tissues Mild diffuse subcutaneous soft tissue swelling. Soft tissue swelling and enhancement most significant at the medial distal stump consistent with cellulitis. Soft tissue ulcer at the medial distal stump with tract extending to the distal first metatarsal. IMPRESSION: 1. Soft tissue ulceration at the distal medial foot stump with tract extending to the distal first metatarsal and surrounding cellulitis. 2. Acute osteomyelitis of the first metatarsal. 3. Other findings as described. Electronically Signed   By: Ofilia Neas M.D.   On: 11/10/2021 13:22   DG Foot Complete Left  Result Date: 11/09/2021 Please see detailed radiograph report in office note.       Scheduled Meds:  carvedilol  6.25 mg Oral BID   ferrous sulfate  325 mg Oral Q breakfast   folic acid  1 mg Oral BID   gabapentin  600 mg Oral TID   insulin aspart  0-15 Units Subcutaneous TID WC   insulin aspart  0-5 Units Subcutaneous QHS   insulin glargine-yfgn  25 Units Subcutaneous Daily   sertraline  150 mg Oral Daily   spironolactone  50 mg Oral q morning   Continuous Infusions:  ceFEPime (MAXIPIME) IV 200 mL/hr at 11/11/21 1153   vancomycin Stopped (11/11/21 0119)     LOS: 2 days    Time spent: 39 minutes spent on chart review, discussion with nursing staff, consultants, updating family and interview/physical exam; more than 50% of that time was spent in counseling and/or coordination of care.    Silvina Hackleman J [place] DO [facility] Available via Epic secure chat 7am-7pm After these hours, please refer to coverage provider listed on [email or web address] 11/11/2021, 12:01 PM

## 2021-11-11 NOTE — H&P (View-Only) (Signed)
  Subjective:  Patient ID: Ariel Arnold, female    DOB: [birth year],  MRN: [id number]  Patient seen bedside. Denies complaints Objective:   Vitals:   11/10/21 2211 11/11/21 0500  BP: (!) 122/52 107/76  Pulse: 78 65  Resp: 16 14  Temp: 97.9 F (36.6 C) (!) 97.4 F (36.3 C)  SpO2: 100% 100%   Objective: AAO x3  DP/PT pulses palpable bilaterally, CRT less than 3 seconds Ulceration plantar medial aspect of the left foot is again worsening today measuring 2 x 1.5 x 1.5 cm and there is probing down to the first metatarsal.  Edema and erythema present to the distal aspect the foot.  There is no fluctuation crepitation.  There is no purulence. No pain with calf compression, swelling, warmth, erythema Assessment & Plan:  Patient was evaluated and treated and all questions answered.  Left foot osteomyelitis -Antibiotics: continue empiric -WB Status: heel WB LLE -Wound Care: Betadine WTD dressing - change daily. -Surgical Plan: plan for operative debridement 11/25 with possible metatarsal resection  Park Liter, DPM  Accessible via secure chat for questions or concerns.

## 2021-11-11 NOTE — Plan of Care (Signed)

## 2021-11-11 NOTE — Progress Notes (Signed)
  Subjective:  Patient ID: Ariel Arnold, female    DOB: [birth year],  MRN: [id number]  Patient seen bedside. Denies complaints Objective:   Vitals:   11/10/21 2211 11/11/21 0500  BP: (!) 122/52 107/76  Pulse: 78 65  Resp: 16 14  Temp: 97.9 F (36.6 C) (!) 97.4 F (36.3 C)  SpO2: 100% 100%   Objective: AAO x3  DP/PT pulses palpable bilaterally, CRT less than 3 seconds Ulceration plantar medial aspect of the left foot is again worsening today measuring 2 x 1.5 x 1.5 cm and there is probing down to the first metatarsal.  Edema and erythema present to the distal aspect the foot.  There is no fluctuation crepitation.  There is no purulence. No pain with calf compression, swelling, warmth, erythema Assessment & Plan:  Patient was evaluated and treated and all questions answered.  Left foot osteomyelitis -Antibiotics: continue empiric -WB Status: heel WB LLE -Wound Care: Betadine WTD dressing - change daily. -Surgical Plan: plan for operative debridement 11/25 with possible metatarsal resection  Park Liter, DPM  Accessible via secure chat for questions or concerns.

## 2021-11-12 ENCOUNTER — Inpatient Hospital Stay (HOSPITAL_COMMUNITY): Payer: Medicare Other | Admitting: Certified Registered Nurse Anesthetist

## 2021-11-12 ENCOUNTER — Encounter (HOSPITAL_COMMUNITY): Payer: Self-pay | Admitting: Family Medicine

## 2021-11-12 ENCOUNTER — Encounter (HOSPITAL_COMMUNITY)
Admission: EM | Disposition: A | Discharge status: Home Health | Payer: Self-pay | Source: Home / Self Care | Attending: Internal Medicine

## 2021-11-12 DIAGNOSIS — M86672 Other chronic osteomyelitis, left ankle and foot: Secondary | ICD-10-CM

## 2021-11-12 HISTORY — PX: WOUND DEBRIDEMENT: SHX247

## 2021-11-12 LAB — GLUCOSE, CAPILLARY
Glucose-Capillary: 120 mg/dL — ABNORMAL HIGH (ref 70–99)
Glucose-Capillary: 124 mg/dL — ABNORMAL HIGH (ref 70–99)
Glucose-Capillary: 137 mg/dL — ABNORMAL HIGH (ref 70–99)
Glucose-Capillary: 124 mg/dL — ABNORMAL HIGH (ref 70–99)
Glucose-Capillary: 260 mg/dL — ABNORMAL HIGH (ref 70–99)

## 2021-11-12 LAB — BASIC METABOLIC PANEL
Anion gap: 5 (ref 5–15)
BUN: 23 mg/dL (ref 8–23)
CO2: 23 mmol/L (ref 22–32)
Calcium: 8.6 mg/dL — ABNORMAL LOW (ref 8.9–10.3)
Chloride: 107 mmol/L (ref 98–111)
Creatinine, Ser: 0.76 mg/dL (ref 0.44–1.00)
GFR, Estimated: >60 mL/min (ref >60)
Glucose, Bld: 119 mg/dL — ABNORMAL HIGH (ref 70–99)
Potassium: 4.8 mmol/L (ref 3.5–5.1)
Sodium: 135 mmol/L (ref 135–145)

## 2021-11-12 LAB — CBC
HCT: 26.4 % — ABNORMAL LOW (ref 36.0–46.0)
Hemoglobin: 8.2 g/dL — ABNORMAL LOW (ref 12.0–15.0)
MCH: 29.1 pg (ref 26.0–34.0)
MCHC: 31.1 g/dL (ref 30.0–36.0)
MCV: 93.6 fL (ref 80.0–100.0)
Platelets: 63 10*3/uL — ABNORMAL LOW (ref 150–400)
RBC: 2.82 MIL/uL — ABNORMAL LOW (ref 3.87–5.11)
RDW: 13.9 % (ref 11.5–15.5)
WBC: 2.2 10*3/uL — ABNORMAL LOW (ref 4.0–10.5)
nRBC: 0 % (ref 0.0–0.2)

## 2021-11-12 LAB — PATHOLOGIST SMEAR REVIEW

## 2021-11-12 SURGERY — DEBRIDEMENT, WOUND
Anesthesia: Monitor Anesthesia Care | Laterality: Left

## 2021-11-12 MED ORDER — ONDANSETRON HCL 4 MG/2ML IJ SOLN
INTRAMUSCULAR | Status: AC
  Filled 2021-11-12: qty 2

## 2021-11-12 MED ORDER — VANCOMYCIN HCL 1000 MG IV SOLR
INTRAVENOUS | Status: AC
  Filled 2021-11-12: qty 20

## 2021-11-12 MED ORDER — OXYCODONE HCL 5 MG PO TABS: 5.0000 mg | ORAL_TABLET | Freq: Once | ORAL | Status: DC | PRN

## 2021-11-12 MED ORDER — BUPIVACAINE HCL (PF) 0.5 % IJ SOLN
INTRAMUSCULAR | Status: DC | PRN
  Administered 2021-11-12: 10 mL

## 2021-11-12 MED ORDER — PROPOFOL 500 MG/50ML IV EMUL
INTRAVENOUS | Status: DC | PRN
  Administered 2021-11-12: 75 ug/kg/min via INTRAVENOUS

## 2021-11-12 MED ORDER — PROPOFOL 1000 MG/100ML IV EMUL
INTRAVENOUS | Status: AC
  Filled 2021-11-12: qty 100

## 2021-11-12 MED ORDER — SODIUM CHLORIDE 0.9 % IR SOLN
Status: DC | PRN
  Administered 2021-11-12: 3000 mL

## 2021-11-12 MED ORDER — PROMETHAZINE HCL 25 MG/ML IJ SOLN: 6.2500 mg | INTRAMUSCULAR | Status: DC | PRN

## 2021-11-12 MED ORDER — SODIUM CHLORIDE 0.9 % IR SOLN
Status: DC | PRN
  Administered 2021-11-12: 1000 mL

## 2021-11-12 MED ORDER — SODIUM CHLORIDE 0.9 % IV SOLN: INTRAVENOUS | Status: DC | PRN

## 2021-11-12 MED ORDER — SODIUM CHLORIDE 0.9 % IV SOLN
INTRAVENOUS | Status: AC
Start: 2021-11-12 — End: 2021-11-12

## 2021-11-12 MED ORDER — ACETAMINOPHEN 325 MG PO TABS: 325.0000 mg | ORAL_TABLET | ORAL | Status: DC | PRN

## 2021-11-12 MED ORDER — OXYCODONE HCL 5 MG/5ML PO SOLN: 5.0000 mg | Freq: Once | ORAL | Status: DC | PRN

## 2021-11-12 MED ORDER — DEXAMETHASONE SODIUM PHOSPHATE 10 MG/ML IJ SOLN
INTRAMUSCULAR | Status: AC
  Filled 2021-11-12: qty 1

## 2021-11-12 MED ORDER — AMISULPRIDE (ANTIEMETIC) 5 MG/2ML IV SOLN: 10.0000 mg | Freq: Once | INTRAVENOUS | Status: DC | PRN

## 2021-11-12 MED ORDER — FENTANYL CITRATE PF 50 MCG/ML IJ SOSY: 25.0000 ug | PREFILLED_SYRINGE | INTRAMUSCULAR | Status: DC | PRN

## 2021-11-12 MED ORDER — FENTANYL CITRATE PF 50 MCG/ML IJ SOSY
PREFILLED_SYRINGE | INTRAMUSCULAR | Status: AC
  Filled 2021-11-12: qty 2

## 2021-11-12 MED ORDER — FENTANYL CITRATE (PF) 100 MCG/2ML IJ SOLN
INTRAMUSCULAR | Status: AC
  Filled 2021-11-12: qty 2

## 2021-11-12 MED ORDER — ACETAMINOPHEN 10 MG/ML IV SOLN
1000.0000 mg | Freq: Once | INTRAVENOUS | Status: DC | PRN
  Administered 2021-11-12: 1000 mg via INTRAVENOUS

## 2021-11-12 MED ORDER — ACETAMINOPHEN 160 MG/5ML PO SOLN: 325.0000 mg | ORAL | Status: DC | PRN

## 2021-11-12 MED ORDER — LACTATED RINGERS IV SOLN: INTRAVENOUS | Status: DC

## 2021-11-12 MED ORDER — MIDAZOLAM HCL 2 MG/2ML IJ SOLN
INTRAMUSCULAR | Status: DC | PRN
  Administered 2021-11-12: 1 mg via INTRAVENOUS

## 2021-11-12 MED ORDER — ACETAMINOPHEN 10 MG/ML IV SOLN
INTRAVENOUS | Status: AC
  Filled 2021-11-12: qty 100

## 2021-11-12 MED ORDER — VANCOMYCIN HCL 1000 MG IV SOLR
INTRAVENOUS | Status: DC | PRN
  Administered 2021-11-12: 1000 mg

## 2021-11-12 MED ORDER — ONDANSETRON HCL 4 MG/2ML IJ SOLN
INTRAMUSCULAR | Status: DC | PRN
  Administered 2021-11-12: 4 mg via INTRAVENOUS

## 2021-11-12 MED ORDER — MIDAZOLAM HCL 2 MG/2ML IJ SOLN
INTRAMUSCULAR | Status: AC
  Filled 2021-11-12: qty 2

## 2021-11-12 MED ORDER — FENTANYL CITRATE (PF) 100 MCG/2ML IJ SOLN
INTRAMUSCULAR | Status: DC | PRN
  Administered 2021-11-12 (×2): 25 ug via INTRAVENOUS
  Administered 2021-11-12: 50 ug via INTRAVENOUS

## 2021-11-12 SURGICAL SUPPLY — 61 items
BAG COUNTER SPONGE SURGICOUNT (BAG) IMPLANT
BLADE HEX COATED 2.75 (ELECTRODE) ×2 IMPLANT
BLADE OSCILLATING/SAGITTAL (BLADE)
BLADE SURG 15 STRL LF DISP TIS (BLADE) ×1 IMPLANT
BLADE SURG 15 STRL SS (BLADE) ×1
BLADE SW THK.38XMED LNG THN (BLADE) IMPLANT
BNDG CONFORM 4 STRL LF (GAUZE/BANDAGES/DRESSINGS) ×1 IMPLANT
BNDG ELASTIC 3X5.8 VLCR STR LF (GAUZE/BANDAGES/DRESSINGS) ×2 IMPLANT
BNDG ELASTIC 4X5.8 VLCR STR LF (GAUZE/BANDAGES/DRESSINGS) ×1 IMPLANT
BNDG ESMARK 4X9 LF (GAUZE/BANDAGES/DRESSINGS) ×2 IMPLANT
BNDG GAUZE ELAST 4 BULKY (GAUZE/BANDAGES/DRESSINGS) ×2 IMPLANT
CHLORAPREP W/TINT 26 (MISCELLANEOUS) ×2 IMPLANT
COVER BACK TABLE 60X90IN (DRAPES) ×2 IMPLANT
CUFF TOURN SGL QUICK 18X4 (TOURNIQUET CUFF) IMPLANT
DRAPE EXTREMITY T 121X128X90 (DISPOSABLE) ×2 IMPLANT
DRAPE IMP U-DRAPE 54X76 (DRAPES) ×2 IMPLANT
DRAPE U-SHAPE 47X51 STRL (DRAPES) ×2 IMPLANT
DRSG EMULSION OIL 3X3 NADH (GAUZE/BANDAGES/DRESSINGS) ×2 IMPLANT
DRSG PAD ABDOMINAL 8X10 ST (GAUZE/BANDAGES/DRESSINGS) IMPLANT
ELECT REM PT RETURN 15FT ADLT (MISCELLANEOUS) ×2 IMPLANT
GAUZE 4X4 16PLY ~~LOC~~+RFID DBL (SPONGE) IMPLANT
GAUZE SPONGE 4X4 12PLY STRL (GAUZE/BANDAGES/DRESSINGS) ×2 IMPLANT
GAUZE XEROFORM 1X8 LF (GAUZE/BANDAGES/DRESSINGS) ×1 IMPLANT
GAUZE XEROFORM 5X9 LF (GAUZE/BANDAGES/DRESSINGS) IMPLANT
GLOVE SRG 8 PF TXTR STRL LF DI (GLOVE) ×1 IMPLANT
GLOVE SURG ENC MOIS LTX SZ7.5 (GLOVE) ×2 IMPLANT
GLOVE SURG NEOPR MICRO LF SZ8 (GLOVE) ×2 IMPLANT
GLOVE SURG UNDER POLY LF SZ8 (GLOVE) ×1
GOWN STRL REUS W/ TWL LRG LVL3 (GOWN DISPOSABLE) ×1 IMPLANT
GOWN STRL REUS W/TWL LRG LVL3 (GOWN DISPOSABLE) ×1
GOWN STRL REUS W/TWL XL LVL3 (GOWN DISPOSABLE) ×4 IMPLANT
KIT BASIN OR (CUSTOM PROCEDURE TRAY) ×2 IMPLANT
KIT TURNOVER KIT A (KITS) IMPLANT
MANIFOLD NEPTUNE II (INSTRUMENTS) ×2 IMPLANT
MATRIX PURAPLY MZ 1000MG (Tissue) IMPLANT
NDL HYPO 25X1 1.5 SAFETY (NEEDLE) ×1 IMPLANT
NEEDLE HYPO 25X1 1.5 SAFETY (NEEDLE) ×2 IMPLANT
NS IRRIG 1000ML POUR BTL (IV SOLUTION) IMPLANT
PACK ORTHO EXTREMITY (CUSTOM PROCEDURE TRAY) ×2 IMPLANT
PADDING CAST ABS 4INX4YD NS (CAST SUPPLIES) ×1
PADDING CAST ABS COTTON 4X4 ST (CAST SUPPLIES) ×1 IMPLANT
PENCIL SMOKE EVACUATOR (MISCELLANEOUS) IMPLANT
PURAPLY MZ 1000MG (Tissue) ×2 IMPLANT
SET IRRIG Y TYPE TUR BLADDER L (SET/KITS/TRAYS/PACK) IMPLANT
SLEEVE SCD COMPRESS KNEE MED (STOCKING) ×2 IMPLANT
SPONGE SURGIFOAM ABS GEL 100 (HEMOSTASIS) IMPLANT
STAPLER VISISTAT 35W (STAPLE) ×2 IMPLANT
STOCKINETTE 8 INCH (MISCELLANEOUS) ×4 IMPLANT
SUT ETHILON 2 0 PS N (SUTURE) ×1 IMPLANT
SUT ETHILON 3 0 PS 1 (SUTURE) IMPLANT
SUT ETHILON 4 0 PS 2 18 (SUTURE) IMPLANT
SUT MNCRL AB 3-0 PS2 18 (SUTURE) IMPLANT
SUT MNCRL AB 4-0 PS2 18 (SUTURE) IMPLANT
SUT MON AB 5-0 PS2 18 (SUTURE) IMPLANT
SUT VIC AB 3-0 FS2 27 (SUTURE) ×2 IMPLANT
SUT VIC AB 4-0 PS2 18 (SUTURE) ×2 IMPLANT
SYR BULB EAR ULCER 3OZ GRN STR (SYRINGE) ×2 IMPLANT
SYR CONTROL 10ML LL (SYRINGE) ×2 IMPLANT
TOWEL OR 17X26 10 PK STRL BLUE (TOWEL DISPOSABLE) ×2 IMPLANT
UNDERPAD 30X36 HEAVY ABSORB (UNDERPADS AND DIAPERS) ×2 IMPLANT
YANKAUER SUCT BULB TIP NO VENT (SUCTIONS) ×2 IMPLANT

## 2021-11-12 NOTE — Plan of Care (Signed)
  Problem: Education: Goal: Knowledge of General Education information will improve Description: Including pain rating scale, medication(s)/side effects and non-pharmacologic comfort measures Outcome: Progressing   Problem: Activity: Goal: Risk for activity intolerance will decrease Outcome: Progressing   Problem: Pain Managment: Goal: General experience of comfort will improve Outcome: Progressing   

## 2021-11-12 NOTE — Anesthesia Preprocedure Evaluation (Addendum)
Anesthesia Evaluation  Patient identified by MRN, date of birth, ID band Patient awake    Reviewed: Allergy & Precautions, NPO status , Patient's Chart, lab work & pertinent test results  Airway Mallampati: II  TM Distance: >3 FB Neck ROM: Full    Dental  (+) Poor Dentition, Missing, Chipped, Dental Advisory Given,    Pulmonary former smoker,     + decreased breath sounds      Cardiovascular hypertension, + CAD and + Peripheral Vascular Disease   Rhythm:Regular Rate:Normal     Neuro/Psych PSYCHIATRIC DISORDERS Anxiety Depression    GI/Hepatic negative GI ROS, (+) Cirrhosis       , Hepatitis -  Endo/Other  diabetes, Type 2  Renal/GU      Musculoskeletal  (+) Arthritis ,   Abdominal Normal abdominal exam  (+)   Peds  Hematology   Anesthesia Other Findings   Reproductive/Obstetrics                            Anesthesia Physical Anesthesia Plan  ASA: 3  Anesthesia Plan: MAC   Post-op Pain Management:    Induction: Intravenous  PONV Risk Score and Plan: 0 and Propofol infusion, Ondansetron and Midazolam  Airway Management Planned: Simple Face Mask and Natural Airway  Additional Equipment: None  Intra-op Plan:   Post-operative Plan:   Informed Consent: I have reviewed the patients History and Physical, chart, labs and discussed the procedure including the risks, benefits and alternatives for the proposed anesthesia with the patient or authorized representative who has indicated his/her understanding and acceptance.       Plan Discussed with: CRNA  Anesthesia Plan Comments: (Lab Results      Component                Value               Date                      WBC                      2.2 (L)             11/12/2021                HGB                      8.2 (L)             11/12/2021                HCT                      26.4 (L)            11/12/2021                MCV                       93.6                11/12/2021                PLT                      63 (L)              11/12/2021           )  Anesthesia Quick Evaluation  

## 2021-11-12 NOTE — Interval H&P Note (Signed)
History and Physical Interval Note:  11/12/2021 12:08 PM  Ariel Arnold  has presented today for surgery, with the diagnosis of Ulcer with osteomyelitis.  The various methods of treatment have been discussed with the patient and family. After consideration of risks, benefits and other options for treatment, the patient has consented to  Procedure(s): DEBRIDEMENT WOUND (Left) as a surgical intervention.  The patient's history has been reviewed, patient examined, no change in status, stable for surgery.  I have reviewed the patient's chart and labs.  Questions were answered to the patient's satisfaction.     Park Liter

## 2021-11-12 NOTE — Anesthesia Postprocedure Evaluation (Signed)
Anesthesia Post Note  Patient: Ariel Arnold  Procedure(s) Performed: DEBRIDEMENT WOUND;FIRST METATARSAL RESECTION; APPLICATION OF SKIN GRAFT SUBSTITUTE (Left)     Patient location during evaluation: PACU Anesthesia Type: MAC Level of consciousness: awake and alert Pain management: pain level controlled Vital Signs Assessment: post-procedure vital signs reviewed and stable Respiratory status: spontaneous breathing, nonlabored ventilation, respiratory function stable and patient connected to nasal cannula oxygen Cardiovascular status: stable and blood pressure returned to baseline Postop Assessment: no apparent nausea or vomiting Anesthetic complications: no   No notable events documented.  Last Vitals:  Vitals:   11/12/21 1438 11/12/21 1519  BP:  (!) 153/58  Pulse: 60 (!) 59  Resp: 10 18  Temp: 36.4 C 37 C  SpO2: 99% 98%    Last Pain:  Vitals:   11/12/21 1519  TempSrc:   PainSc: 0-No pain                 Effie Berkshire

## 2021-11-12 NOTE — Op Note (Signed)
  Patient Name: Ariel Arnold DOB: [birth year]  MRN: [id number]   Date of Surgery: 11/12/21  Surgeon: Dr. Hardie Pulley, DPM Assistants: none  Pre-operative Diagnosis:  Ulcer left 1st metatarsal with necrosis of bone and osteomyelitis Post-operative Diagnosis:  same Procedures:  1) Left 1st metatarsal resection  2) Wound excision and closure  3) Application of skin graft substitute Pathology/Specimens: ID Type Source Tests Collected by Time Destination  1 : 1st metatarsal Tissue PATH Soft tissue SURGICAL PATHOLOGY Evelina Bucy, DPM 11/12/2021 1340   A : 1st metatarsal for culture Tissue PATH Soft tissue AEROBIC/ANAEROBIC CULTURE W GRAM STAIN (SURGICAL/DEEP WOUND) Evelina Bucy, DPM 11/12/2021 1339    Anesthesia: MAC Hemostasis: anatomic Estimated Blood Loss: 10 ml Materials:  Implant Name Type Inv. Item Serial No. Manufacturer Lot No. LRB No. Used Action  IMPL PURAPLY MZ 1000 - [id number] Tissue IMPL PURAPLY MZ 1000  [employer] [id number] Left 1 Implanted   Medications: 1g vancomycin powder, 10 ccs 0.5% marcaine plain Complications: none  Indications for Procedure:  This is a 65 y.o. female with a chronic left foot wound.  MRI showed osteomyelitis of almost entire to the first metatarsal.  She has history of transmetatarsal amputation of all digits. Unfortunately she is a poor candidate to remove the entirety of the first metatarsal as this will likely cause immediate ulceration under the lesser metatarsals.  It was discussed she would benefit from first metatarsal resection with bone biopsy.   Procedure in Detail: Patient was identified in pre-operative holding area. Formal consent was signed and the left lower extremity was marked. Patient was brought back to the operating room. Anesthesia was induced. The extremity was prepped and draped in the usual sterile fashion. Timeout was taken to confirm patient name, laterality, and procedure prior to incision.    Attention was then directed to the left foot where there was a wound submet 1 approx 0.5cm with draining sinus. The wound probed to bone. The wound was ellipsed and excised.   The first metatarsal bone was resected with a rongeur.  The bone was noted to be moderately firm and normal in coloration indicating viability.  The wound was sharply debrided with a rongeur and sagittal saw and smoothed with rasp so that the bone was no longer prominent.  Samples of bone were sent for both microbiology and pathology.  PuraPly MZ powder was then mixed with 1 g of vancomycin powder and a little bit of saline to form a putty this was applied to the wound over the bone.  The resultant wound measured 3 x 1 and was primarily closed with 2-0 nylon.  The foot was then dressed with xeroform, 4x4, kerlix, and ACE bandage. Patient tolerated the procedure well.   Disposition: Following a period of post-operative monitoring, patient will be transferred back to the floor.  We will await cultures to determine what antibiotics would be best for treating the infection of the bone.  She would likely need 6 weeks of antibiotics for this infection.  We will monitor for clinical improvement as well and consider discharge when appropriate.

## 2021-11-12 NOTE — Transfer of Care (Signed)
Immediate Anesthesia Transfer of Care Note  Patient: Ariel Arnold  Procedure(s) Performed: DEBRIDEMENT WOUND;FIRST METATARSAL RESECTION; APPLICATION OF SKIN GRAFT SUBSTITUTE (Left)  Patient Location: PACU  Anesthesia Type:MAC  Level of Consciousness: awake, alert  and patient cooperative  Airway & Oxygen Therapy: Patient Spontanous Breathing and Patient connected to face mask oxygen  Post-op Assessment: Report given to RN and Post -op Vital signs reviewed and stable  Post vital signs: Reviewed and stable  Last Vitals:  Vitals Value Taken Time  BP 106/51 11/12/21 1404  Temp    Pulse 69 11/12/21 1405  Resp 9 11/12/21 1405  SpO2 97 % 11/12/21 1405  Vitals shown include unvalidated device data.  Last Pain:  Vitals:   11/12/21 0746  TempSrc:   PainSc: 0-No pain         Complications: No notable events documented.

## 2021-11-12 NOTE — Anesthesia Procedure Notes (Signed)
Procedure Name: MAC Date/Time: 11/12/2021 1:20 PM Performed by: Lollie Sails, CRNA Pre-anesthesia Checklist: Patient identified, Emergency Drugs available, Suction available, Patient being monitored and Timeout performed Oxygen Delivery Method: Simple face mask Placement Confirmation: positive ETCO2

## 2021-11-12 NOTE — Progress Notes (Signed)
Spoke to Addis obtained report for surgery later today.

## 2021-11-12 NOTE — Progress Notes (Signed)
PROGRESS NOTE    [name]  [id number] DOB: 02/21/1956 DOA: 11/09/2021 PCP: Jacqualine Code, DO    Brief Narrative:  Ariel Arnold is a 65 year old female with past medical history significant for type 2 diabetes mellitus, recurrent osteomyelitis of left foot s/p amputation with ongoing wound, essential hypertension, NASH cirrhosis, peripheral vascular disease, history of AVMs with prior GI bleeding, pancytopenia, depression who presents to [facility] ED for worsening left foot swelling, redness and drainage.  Patient was seen by her podiatrist earlier in the day with recommendations of ED evaluation for hospital admission.  Patient has been on multiple rounds of antibiotics for osteomyelitis and finished IV vancomycin about 2-3 weeks ago and continued on oral ciprofloxacin until last week.  She denies any fevers or chills, no shortness of breath, no nausea/vomiting/diarrhea.    Patient also reports she ran out of her insulin over a week ago and is waiting for her insurance to approve her insulin at the pharmacy for her to pick up.  In the ED, temperature 97.7 F, HR 86, RR 16, BP 149/63, SPO2 100% on room air.  Sodium 134, potassium 3.9, chloride 101, CO2 24, glucose 65, anion gap 9, BUN 19, creatinine 0.89.  AST 86, ALT 59.  WBC 1.5, hemoglobin 8.5, platelets 57.  CRP 0.9.  Cova-19 PCR negative.  Influenza A/B PCR negative.  Left foot x-ray with soft tissue ulcer along the medial distal stump, lucency/irregularity of the residual distal first metatarsal, diffuse soft tissue swelling of the foot.  Podiatry was consulted.  Hospital service consulted for further evaluation and management of acute diabetic foot infection with likely concern of acute osteomyelitis.   Assessment & Plan:   Principal Problem:   Chronic osteomyelitis involving ankle and foot, left (HCC) Active Problems:   Liver cirrhosis secondary to NASH (HCC)   Depressive disorder   Uncontrolled type 2 diabetes mellitus  with hyperglycemia (HCC)   Essential hypertension   Pancytopenia (HCC)   Acute on chronic osteomyelitis left foot Diabetic foot infection Patient presenting to ED by direction of her podiatrist for worsening left foot wound concerning for acute on chronic osteomyelitis.  Patient completed outpatient course of IV vancomycin followed by oral ciprofloxacin without improvement.  X-ray left foot with soft tissue ulcer medial distal stump with lucency irregularity of the residual distal first metatarsal and diffuse soft tissue swelling of the foot.  MRI left foot with and without contrast with soft tissue ulceration distal medial foot stump with tract extending to the distal first metatarsal and surrounding cellulitis, acute osteomyelitis of the first metatarsal. --Podiatry following, appreciate assistance --Vancomycin, pharmacy consulted for dosing/monitoring --Cefepime 2 g IV every 8 hours --Heel weightbearing left lower extremity --Betadine WTD dressing change daily -- N.p.o., surgical invention on plan for today  Type 2 diabetes mellitus, with hyperglycemia Hemoglobin A1c 9.4, poorly controlled.  Patient reports has been out of her insulin; at baseline on Toujeo 65 units every qAM/qPM, metformin 500 mg p.o. twice daily. --Diabetic educator following, appreciate assistance --Semglee 25 units subcutaneously daily --Moderate SSI for coverage --CBGs qAC/HS  Essential hypertension --Carvedilol 6.25 mg p.o. twice daily --Spironolactone 50 mg p.o. daily  Hx GI bleed/AVM Iron deficiency anemia --Hgb 8.5>7.6>7.8>8.2, stable --Ferrous sulfate 325 mg p.o. daily --CBC daily  Depression/anxiety: Sertraline 150 mg p.o. daily  Neuropathy: Gabapentin 600 mg p.o. TID  HLD: Pravastatin 20 mg p.o. nightly  Nash cirrhosis --Spironolactone 50 mg p.o. every morning     DVT prophylaxis: SCDs Start: 11/10/21  0806   Code Status: Full Code Family Communication: No family present at bedside this  morning  Disposition Plan:  Level of care: Med-Surg Status is: Inpatient  Remains inpatient appropriate because: Pending surgical intervention per podiatry on 11/12/2021    Consultants:  Podiatry, Dr. March Rummage  Procedures:  None  Antimicrobials:  Vancomycin 11/22>> Cefepime 11/23>>    Subjective: Patient seen examined bedside, resting comfortably.  Sitting at side of bed.  No complaints this morning.  Awaiting surgery later this afternoon. Pain controlled.  No other questions or concerns at this time.  Denies headache, no fever/chills/night sweats, no nausea/vomiting/diarrhea, no chest pain, no palpitations, no shortness of breath, no abdominal pain, no weakness, no fatigue, no paresthesias.  No acute events overnight per nursing staff.  Objective: Vitals:   11/11/21 0500 11/11/21 1400 11/11/21 2135 11/12/21 0623  BP: 107/76 (!) 113/56 (!) 114/47 (!) 100/45  Pulse: 65 63 73 65  Resp: 14 18 17 16   Temp: (!) 97.4 F (36.3 C) 97.8 F (36.6 C) 98.1 F (36.7 C) 98.1 F (36.7 C)  TempSrc: Oral Oral Oral Oral  SpO2: 100% 97% 99% 99%  Weight:      Height:        Intake/Output Summary (Last 24 hours) at 11/12/2021 1036 Last data filed at 11/12/2021 0600 Gross per 24 hour  Intake 1917.69 ml  Output 400 ml  Net 1517.69 ml   Filed Weights   11/09/21 1945 11/10/21 0046  Weight: 86.2 kg 86.5 kg    Examination:  General exam: Appears calm and comfortable, appears older than stated age Respiratory system: Clear to auscultation. Respiratory effort normal.  On room air Cardiovascular system: S1 & S2 heard, RRR. No JVD, murmurs, rubs, gallops or clicks. No pedal edema. Gastrointestinal system: Abdomen is nondistended, soft and nontender. No organomegaly or masses felt. Normal bowel sounds heard. Central nervous system: Alert and oriented. No focal neurological deficits. Extremities: Symmetric 5 x 5 power. Skin: Left foot with dressing in place, wound noted to left first  metatarsal region, otherwise no rashes, lesions or ulcers Psychiatry: Judgement and insight appear normal. Mood & affect appropriate.     Data Reviewed: I have personally reviewed following labs and imaging studies  CBC: Recent Labs  Lab 11/09/21 2013 11/10/21 0832 11/11/21 0330 11/12/21 0302  WBC 1.5* 1.6* 1.8* 2.2*  NEUTROABS 0.8*  --   --   --   HGB 8.5* 7.6* 7.8* 8.2*  HCT 27.2* 24.3* 24.9* 26.4*  MCV 93.8 93.5 94.0 93.6  PLT 57* 53* 57* 63*   Basic Metabolic Panel: Recent Labs  Lab 11/09/21 2013 11/10/21 0832 11/11/21 0330 11/12/21 0302  NA 134* 138 136 135  K 3.9 3.8 3.8 4.8  CL 101 108 106 107  CO2 24 25 22 23   GLUCOSE 605* 134* 288* 119*  BUN 19 18 21 23   CREATININE 0.89 0.71 0.79 0.76  CALCIUM 8.8* 8.3* 8.3* 8.6*   GFR: Estimated Creatinine Clearance: 78.5 mL/min (by C-G formula based on SCr of 0.76 mg/dL). Liver Function Tests: Recent Labs  Lab 11/09/21 2013  AST 86*  ALT 59*  ALKPHOS 124  BILITOT 0.9  PROT 7.0  ALBUMIN 3.3*   No results for input(s): LIPASE, AMYLASE in the last 168 hours. No results for input(s): AMMONIA in the last 168 hours. Coagulation Profile: No results for input(s): INR, PROTIME in the last 168 hours. Cardiac Enzymes: No results for input(s): CKTOTAL, CKMB, CKMBINDEX, TROPONINI in the last 168 hours. BNP (last 3  results) No results for input(s): PROBNP in the last 8760 hours. HbA1C: Recent Labs    11/10/21 0832  HGBA1C 9.4*   CBG: Recent Labs  Lab 11/11/21 0736 11/11/21 1211 11/11/21 1717 11/11/21 2137 11/12/21 0723  GLUCAP 326* 368* 253* 224* 120*   Lipid Profile: No results for input(s): CHOL, HDL, LDLCALC, TRIG, CHOLHDL, LDLDIRECT in the last 72 hours. Thyroid Function Tests: No results for input(s): TSH, T4TOTAL, FREET4, T3FREE, THYROIDAB in the last 72 hours. Anemia Panel: No results for input(s): VITAMINB12, FOLATE, FERRITIN, TIBC, IRON, RETICCTPCT in the last 72 hours. Sepsis Labs: No results for  input(s): PROCALCITON, LATICACIDVEN in the last 168 hours.  Recent Results (from the past 240 hour(s))  Resp Panel by RT-PCR (Flu A&B, Covid) Nasopharyngeal Swab     Status: None   Collection Time: 11/09/21  9:19 PM   Specimen: Nasopharyngeal Swab; Nasopharyngeal(NP) swabs in vial transport medium  Result Value Ref Range Status   SARS Coronavirus 2 by RT PCR NEGATIVE NEGATIVE Final    Comment: (NOTE) SARS-CoV-2 target nucleic acids are NOT DETECTED.  The SARS-CoV-2 RNA is generally detectable in upper respiratory specimens during the acute phase of infection. The lowest concentration of SARS-CoV-2 viral copies this assay can detect is 138 copies/mL. A negative result does not preclude SARS-Cov-2 infection and should not be used as the sole basis for treatment or other patient management decisions. A negative result may occur with  improper specimen collection/handling, submission of specimen other than nasopharyngeal swab, presence of viral mutation(s) within the areas targeted by this assay, and inadequate number of viral copies(<138 copies/mL). A negative result must be combined with clinical observations, patient history, and epidemiological information. The expected result is Negative.  Fact Sheet for Patients:  EntrepreneurPulse.com.au  Fact Sheet for Healthcare Providers:  IncredibleEmployment.be  This test is no t yet approved or cleared by the [place] FDA and  has been authorized for detection and/or diagnosis of SARS-CoV-2 by FDA under an Emergency Use Authorization (EUA). This EUA will remain  in effect (meaning this test can be used) for the duration of the COVID-19 declaration under Section 564(b)(1) of the Act, 21 U.S.C.section 360bbb-3(b)(1), unless the authorization is terminated  or revoked sooner.       Influenza A by PCR NEGATIVE NEGATIVE Final   Influenza B by PCR NEGATIVE NEGATIVE Final    Comment: (NOTE) The  Xpert Xpress SARS-CoV-2/FLU/RSV plus assay is intended as an aid in the diagnosis of influenza from Nasopharyngeal swab specimens and should not be used as a sole basis for treatment. Nasal washings and aspirates are unacceptable for Xpert Xpress SARS-CoV-2/FLU/RSV testing.  Fact Sheet for Patients: EntrepreneurPulse.com.au  Fact Sheet for Healthcare Providers: IncredibleEmployment.be  This test is not yet approved or cleared by the [place] FDA and has been authorized for detection and/or diagnosis of SARS-CoV-2 by FDA under an Emergency Use Authorization (EUA). This EUA will remain in effect (meaning this test can be used) for the duration of the COVID-19 declaration under Section 564(b)(1) of the Act, 21 U.S.C. section 360bbb-3(b)(1), unless the authorization is terminated or revoked.  Performed at [facility], [place] [address] [place], [place] [address]          Radiology Studies: MR FOOT LEFT W WO CONTRAST  Result Date: 11/10/2021 CLINICAL DATA:  Possible osteomyelitis.  Open wound EXAM: MRI OF THE LEFT FOREFOOT WITHOUT AND WITH CONTRAST TECHNIQUE: Multiplanar, multisequence MR imaging of the left foot was performed both before and after  administration of intravenous contrast. CONTRAST:  58mL GADAVIST GADOBUTROL 1 MMOL/ML IV SOLN COMPARISON:  Left foot x-ray 11/09/2021 FINDINGS: Study is somewhat limited due to motion. Bones/Joint/Cartilage Previous amputation of the toes. There is cortical destruction at the distal first metatarsal with marked pulmonary edema and enhancement throughout most of the first metatarsal with sparing of the base, consistent with acute osteomyelitis. No definite osteomyelitis in the second through fifth metatarsals. Patchy mild bone marrow edema and enhancement visualized in the cuneiform bones, cuboid bone and navicular bone felt to be most likely reactive/degenerative in nature. Ligaments No  significant findings. Muscles and Tendons Mild edema in the intrinsic musculature of the foot. No evidence of tenosynovitis. Soft tissues Mild diffuse subcutaneous soft tissue swelling. Soft tissue swelling and enhancement most significant at the medial distal stump consistent with cellulitis. Soft tissue ulcer at the medial distal stump with tract extending to the distal first metatarsal. IMPRESSION: 1. Soft tissue ulceration at the distal medial foot stump with tract extending to the distal first metatarsal and surrounding cellulitis. 2. Acute osteomyelitis of the first metatarsal. 3. Other findings as described. Electronically Signed   By: Jannifer Hick M.D.   On: 11/10/2021 13:22        Scheduled Meds:  carvedilol  6.25 mg Oral BID   ferrous sulfate  325 mg Oral Q breakfast   folic acid  1 mg Oral BID   gabapentin  600 mg Oral TID   insulin aspart  0-15 Units Subcutaneous TID WC   insulin aspart  0-5 Units Subcutaneous QHS   insulin glargine-yfgn  25 Units Subcutaneous Daily   sertraline  150 mg Oral Daily   spironolactone  50 mg Oral q morning   Continuous Infusions:  sodium chloride     sodium chloride 75 mL/hr at 11/12/21 0920   ceFEPime (MAXIPIME) IV 2 g (11/12/21 0920)   vancomycin 1,500 mg (11/11/21 2112)     LOS: 3 days    Time spent: 39 minutes spent on chart review, discussion with nursing staff, consultants, updating family and interview/physical exam; more than 50% of that time was spent in counseling and/or coordination of care.    Alvira Philips [place], DO [facility] Available via Epic secure chat 7am-7pm After these hours, please refer to coverage provider listed on [email or web address] 11/12/2021, 10:36 AM

## 2021-11-13 DIAGNOSIS — M86672 Other chronic osteomyelitis, left ankle and foot: Secondary | ICD-10-CM | POA: Diagnosis not present

## 2021-11-13 LAB — BASIC METABOLIC PANEL
Anion gap: 6 (ref 5–15)
BUN: 24 mg/dL — ABNORMAL HIGH (ref 8–23)
CO2: 21 mmol/L — ABNORMAL LOW (ref 22–32)
Calcium: 8.5 mg/dL — ABNORMAL LOW (ref 8.9–10.3)
Chloride: 103 mmol/L (ref 98–111)
Creatinine, Ser: 0.81 mg/dL (ref 0.44–1.00)
GFR, Estimated: >60 mL/min (ref >60)
Glucose, Bld: 260 mg/dL — ABNORMAL HIGH (ref 70–99)
Potassium: 5.3 mmol/L — ABNORMAL HIGH (ref 3.5–5.1)
Sodium: 130 mmol/L — ABNORMAL LOW (ref 135–145)

## 2021-11-13 LAB — CBC
HCT: 26 % — ABNORMAL LOW (ref 36.0–46.0)
Hemoglobin: 7.9 g/dL — ABNORMAL LOW (ref 12.0–15.0)
MCH: 28.9 pg (ref 26.0–34.0)
MCHC: 30.4 g/dL (ref 30.0–36.0)
MCV: 95.2 fL (ref 80.0–100.0)
Platelets: 56 10*3/uL — ABNORMAL LOW (ref 150–400)
RBC: 2.73 MIL/uL — ABNORMAL LOW (ref 3.87–5.11)
RDW: 14.1 % (ref 11.5–15.5)
WBC: 2.2 10*3/uL — ABNORMAL LOW (ref 4.0–10.5)
nRBC: 0 % (ref 0.0–0.2)

## 2021-11-13 LAB — GLUCOSE, CAPILLARY
Glucose-Capillary: 196 mg/dL — ABNORMAL HIGH (ref 70–99)
Glucose-Capillary: 226 mg/dL — ABNORMAL HIGH (ref 70–99)
Glucose-Capillary: 266 mg/dL — ABNORMAL HIGH (ref 70–99)
Glucose-Capillary: 203 mg/dL — ABNORMAL HIGH (ref 70–99)

## 2021-11-13 LAB — POTASSIUM: Potassium: 4.4 mmol/L (ref 3.5–5.1)

## 2021-11-13 LAB — MAGNESIUM: Magnesium: 1.6 mg/dL — ABNORMAL LOW (ref 1.7–2.4)

## 2021-11-13 MED ORDER — INSULIN GLARGINE-YFGN 100 UNIT/ML ~~LOC~~ SOLN
35.0000 [IU] | Freq: Every day | SUBCUTANEOUS | Status: DC
Start: 2021-11-13 — End: 2021-11-14
  Administered 2021-11-13 – 2021-11-14 (×2): 35 [IU] via SUBCUTANEOUS
  Filled 2021-11-13 (×2): qty 0.35

## 2021-11-13 MED ORDER — SODIUM CHLORIDE 0.9 % IV BOLUS
1000.0000 mL | Freq: Once | INTRAVENOUS | Status: AC
  Administered 2021-11-13: 1000 mL via INTRAVENOUS

## 2021-11-13 MED ORDER — HYDRALAZINE HCL 25 MG PO TABS: 25.0000 mg | ORAL_TABLET | Freq: Four times a day (QID) | ORAL | Status: DC | PRN

## 2021-11-13 NOTE — Progress Notes (Signed)
PROGRESS NOTE    Ariel Arnold  [id number] DOB: [birth year] DOA: 11/09/2021 PCP: Jacqualine Code, DO    Brief Narrative:  Ariel Arnold is a 65 year old female with past medical history significant for type 2 diabetes mellitus, recurrent osteomyelitis of left foot s/p amputation with ongoing wound, essential hypertension, NASH cirrhosis, peripheral vascular disease, history of AVMs with prior GI bleeding, pancytopenia, depression who presents to [facility] ED for worsening left foot swelling, redness and drainage.  Patient was seen by her podiatrist earlier in the day with recommendations of ED evaluation for hospital admission.  Patient has been on multiple rounds of antibiotics for osteomyelitis and finished IV vancomycin about 2-3 weeks ago and continued on oral ciprofloxacin until last week.  She denies any fevers or chills, no shortness of breath, no nausea/vomiting/diarrhea.    Patient also reports she ran out of her insulin over a week ago and is waiting for her insurance to approve her insulin at the pharmacy for her to pick up.  In the ED, temperature 97.7 F, HR 86, RR 16, BP 149/63, SPO2 100% on room air.  Sodium 134, potassium 3.9, chloride 101, CO2 24, glucose 65, anion gap 9, BUN 19, creatinine 0.89.  AST 86, ALT 59.  WBC 1.5, hemoglobin 8.5, platelets 57.  CRP 0.9.  Cova-19 PCR negative.  Influenza A/B PCR negative.  Left foot x-ray with soft tissue ulcer along the medial distal stump, lucency/irregularity of the residual distal first metatarsal, diffuse soft tissue swelling of the foot.  Podiatry was consulted.  Hospital service consulted for further evaluation and management of acute diabetic foot infection with likely concern of acute osteomyelitis.   Assessment & Plan:   Principal Problem:   Chronic osteomyelitis involving ankle and foot, left (HCC) Active Problems:   Liver cirrhosis secondary to NASH (HCC)   Depressive disorder   Uncontrolled type 2 diabetes mellitus  with hyperglycemia (HCC)   Essential hypertension   Pancytopenia (HCC)   Acute on chronic osteomyelitis left foot Diabetic foot infection Patient presenting to ED by direction of her podiatrist for worsening left foot wound concerning for acute on chronic osteomyelitis.  Patient completed outpatient course of IV vancomycin followed by oral ciprofloxacin without improvement.  X-ray left foot with soft tissue ulcer medial distal stump with lucency irregularity of the residual distal first metatarsal and diffuse soft tissue swelling of the foot.  MRI left foot with and without contrast with soft tissue ulceration distal medial foot stump with tract extending to the distal first metatarsal and surrounding cellulitis, acute osteomyelitis of the first metatarsal.  Podiatry was consulted and patient underwent left first metatarsal resection with wound excision/closure and skin graft by Dr. March Rummage on 11/12/2021. --Vancomycin, pharmacy consulted for dosing/monitoring --Cefepime 2 g IV every 8 hours --Operative culture, no organisms on gram stain, further pending --Dressing changes per podiatry --Likely need 6 weeks IV antibiotics pending culture results, will continue broad-spectrum for now per podiatry  Type 2 diabetes mellitus, with hyperglycemia Hemoglobin A1c 9.4, poorly controlled.  Patient reports has been out of her insulin; at baseline on Toujeo 65 units every qAM/qPM, metformin 500 mg p.o. twice daily. --Diabetic educator following, appreciate assistance --Semglee 35 units subcutaneously daily --Moderate SSI for coverage --CBGs qAC/HS  Hyperkalemia Potassium 5.3.  We will hold spironolactone.  IV fluid hydration. --Repeat potassium this afternoon --BMP in am  Hyponatremia Sodium 130 this morning, likely secondary to viral depletion from prolonged n.p.o. status from surgery yesterday. --IV fluid hydration --BMP  in a.m.  Essential hypertension --Carvedilol 6.25 mg p.o. twice  daily --Holding spironolactone as above for hyperkalemia. --Hydralazine 25 mg p.o. q6h PRN SBP >170 or DBP >110  Hx GI bleed/AVM Iron deficiency anemia --Hgb 8.5>7.6>7.8>8.2>7.9 --Ferrous sulfate 325 mg p.o. daily --CBC daily  Depression/anxiety: Sertraline 150 mg p.o. daily  Neuropathy: Gabapentin 600 mg p.o. TID  HLD: Pravastatin 20 mg p.o. nightly  Nash cirrhosis --Spironolactone 50 mg p.o. every morning     DVT prophylaxis: SCDs Start: 11/10/21 0806   Code Status: Full Code Family Communication: No family present at bedside this morning  Disposition Plan:  Level of care: Med-Surg Status is: Inpatient  Remains inpatient appropriate because: Awaiting operative cultures for anticipated prolonged home IV antibiotic therapy.    Consultants:  Podiatry, Dr. Samuella Cota  Procedures:  Left first metatarsal resection, wound excision and closure with skin graft; Dr. Samuella Cota 11/25  Antimicrobials:  Vancomycin 11/22>> Cefepime 11/23>>    Subjective: Patient seen examined bedside, resting comfortably.  No complaints this morning.  Pain controlled.  Continues on empiric antibiotics awaiting operative culture results.  Likely will need prolonged IV antibiotics per podiatry, likely 6 weeks pending culture. Denies headache, no fever/chills/night sweats, no nausea/vomiting/diarrhea, no chest pain, no palpitations, no shortness of breath, no abdominal pain, no weakness, no fatigue, no paresthesias.  No acute events overnight per nursing staff.  Objective: Vitals:   11/12/21 2120 11/13/21 0111 11/13/21 0549 11/13/21 0739  BP: 125/67 (!) 148/71 (!) 138/54 113/61  Pulse: 74 84 74 76  Resp: 16 18 16 20   Temp: 98.2 F (36.8 C) 98.6 F (37 C) 98 F (36.7 C) 98.6 F (37 C)  TempSrc: Oral Oral Oral Oral  SpO2: 95% 95% 98% 97%  Weight:      Height:        Intake/Output Summary (Last 24 hours) at 11/13/2021 1332 Last data filed at 11/13/2021 1043 Gross per 24 hour  Intake 3192.03 ml   Output 0 ml  Net 3192.03 ml   Filed Weights   11/09/21 1945 11/10/21 0046  Weight: 86.2 kg 86.5 kg    Examination:  General exam: Appears calm and comfortable, appears older than stated age Respiratory system: Clear to auscultation. Respiratory effort normal.  On room air Cardiovascular system: S1 & S2 heard, RRR. No JVD, murmurs, rubs, gallops or clicks. No pedal edema. Gastrointestinal system: Abdomen is nondistended, soft and nontender. No organomegaly or masses felt. Normal bowel sounds heard. Central nervous system: Alert and oriented. No focal neurological deficits. Extremities: Symmetric 5 x 5 power. Skin: Left foot with dressing/Ace wrap in place, clean/dry/intact, otherwise no rashes, lesions or ulcers Psychiatry: Judgement and insight appear normal. Mood & affect appropriate.     Data Reviewed: I have personally reviewed following labs and imaging studies  CBC: Recent Labs  Lab 11/09/21 2013 11/10/21 0832 11/11/21 0330 11/12/21 0302 11/13/21 0317  WBC 1.5* 1.6* 1.8* 2.2* 2.2*  NEUTROABS 0.8*  --   --   --   --   HGB 8.5* 7.6* 7.8* 8.2* 7.9*  HCT 27.2* 24.3* 24.9* 26.4* 26.0*  MCV 93.8 93.5 94.0 93.6 95.2  PLT 57* 53* 57* 63* 56*   Basic Metabolic Panel: Recent Labs  Lab 11/09/21 2013 11/10/21 0832 11/11/21 0330 11/12/21 0302 11/13/21 0317  NA 134* 138 136 135 130*  K 3.9 3.8 3.8 4.8 5.3*  CL 101 108 106 107 103  CO2 24 25 22 23  21*  GLUCOSE 605* 134* 288* 119* 260*  BUN 19 18  21 23 24*  CREATININE 0.89 0.71 0.79 0.76 0.81  CALCIUM 8.8* 8.3* 8.3* 8.6* 8.5*  MG  --   --   --   --  1.6*   GFR: Estimated Creatinine Clearance: 77.6 mL/min (by C-G formula based on SCr of 0.81 mg/dL). Liver Function Tests: Recent Labs  Lab 11/09/21 2013  AST 86*  ALT 59*  ALKPHOS 124  BILITOT 0.9  PROT 7.0  ALBUMIN 3.3*   No results for input(s): LIPASE, AMYLASE in the last 168 hours. No results for input(s): AMMONIA in the last 168 hours. Coagulation  Profile: No results for input(s): INR, PROTIME in the last 168 hours. Cardiac Enzymes: No results for input(s): CKTOTAL, CKMB, CKMBINDEX, TROPONINI in the last 168 hours. BNP (last 3 results) No results for input(s): PROBNP in the last 8760 hours. HbA1C: No results for input(s): HGBA1C in the last 72 hours.  CBG: Recent Labs  Lab 11/12/21 1407 11/12/21 1649 11/12/21 2121 11/13/21 0737 11/13/21 1125  GLUCAP 137* 124* 260* 196* 226*   Lipid Profile: No results for input(s): CHOL, HDL, LDLCALC, TRIG, CHOLHDL, LDLDIRECT in the last 72 hours. Thyroid Function Tests: No results for input(s): TSH, T4TOTAL, FREET4, T3FREE, THYROIDAB in the last 72 hours. Anemia Panel: No results for input(s): VITAMINB12, FOLATE, FERRITIN, TIBC, IRON, RETICCTPCT in the last 72 hours. Sepsis Labs: No results for input(s): PROCALCITON, LATICACIDVEN in the last 168 hours.  Recent Results (from the past 240 hour(s))  Resp Panel by RT-PCR (Flu A&B, Covid) Nasopharyngeal Swab     Status: None   Collection Time: 11/09/21  9:19 PM   Specimen: Nasopharyngeal Swab; Nasopharyngeal(NP) swabs in vial transport medium  Result Value Ref Range Status   SARS Coronavirus 2 by RT PCR NEGATIVE NEGATIVE Final    Comment: (NOTE) SARS-CoV-2 target nucleic acids are NOT DETECTED.  The SARS-CoV-2 RNA is generally detectable in upper respiratory specimens during the acute phase of infection. The lowest concentration of SARS-CoV-2 viral copies this assay can detect is 138 copies/mL. A negative result does not preclude SARS-Cov-2 infection and should not be used as the sole basis for treatment or other patient management decisions. A negative result may occur with  improper specimen collection/handling, submission of specimen other than nasopharyngeal swab, presence of viral mutation(s) within the areas targeted by this assay, and inadequate number of viral copies(<138 copies/mL). A negative result must be combined  with clinical observations, patient history, and epidemiological information. The expected result is Negative.  Fact Sheet for Patients:  EntrepreneurPulse.com.au  Fact Sheet for Healthcare Providers:  IncredibleEmployment.be  This test is no t yet approved or cleared by the [place] FDA and  has been authorized for detection and/or diagnosis of SARS-CoV-2 by FDA under an Emergency Use Authorization (EUA). This EUA will remain  in effect (meaning this test can be used) for the duration of the COVID-19 declaration under Section 564(b)(1) of the Act, 21 U.S.C.section 360bbb-3(b)(1), unless the authorization is terminated  or revoked sooner.       Influenza A by PCR NEGATIVE NEGATIVE Final   Influenza B by PCR NEGATIVE NEGATIVE Final    Comment: (NOTE) The Xpert Xpress SARS-CoV-2/FLU/RSV plus assay is intended as an aid in the diagnosis of influenza from Nasopharyngeal swab specimens and should not be used as a sole basis for treatment. Nasal washings and aspirates are unacceptable for Xpert Xpress SARS-CoV-2/FLU/RSV testing.  Fact Sheet for Patients: EntrepreneurPulse.com.au  Fact Sheet for Healthcare Providers: IncredibleEmployment.be  This test is not yet approved  or cleared by the [place] and has been authorized for detection and/or diagnosis of SARS-CoV-2 by FDA under an Emergency Use Authorization (EUA). This EUA will remain in effect (meaning this test can be used) for the duration of the COVID-19 declaration under Section 564(b)(1) of the Act, 21 U.S.C. section 360bbb-3(b)(1), unless the authorization is terminated or revoked.  Performed at [facility], [place] [address] [place], [place] [address]   Aerobic/Anaerobic Culture w Gram Stain (surgical/deep wound)     Status: None (Preliminary result)   Collection Time: 11/12/21  1:39 PM   Specimen: PATH Soft tissue   Result Value Ref Range Status   Specimen Description   Final    TISSUE Performed at [place] [address], [place], [place] [address]    Special Requests 1ST METATARSAL  Final   Gram Stain   Final    RARE WBC PRESENT, PREDOMINANTLY MONONUCLEAR NO ORGANISMS SEEN    Culture   Final    TOO YOUNG TO READ Performed at [facility], [place] [address], [place],  [address]    Report Status PENDING  Incomplete         Radiology Studies: No results found.      Scheduled Meds:  carvedilol  6.25 mg Oral BID   ferrous sulfate  325 mg Oral Q breakfast   folic acid  1 mg Oral BID   gabapentin  600 mg Oral TID   insulin aspart  0-15 Units Subcutaneous TID WC   insulin aspart  0-5 Units Subcutaneous QHS   insulin glargine-yfgn  35 Units Subcutaneous Daily   sertraline  150 mg Oral Daily   Continuous Infusions:  sodium chloride     ceFEPime (MAXIPIME) IV 2 g (11/13/21 0811)   vancomycin 1,500 mg (11/12/21 2145)     LOS: 4 days    Time spent: 39 minutes spent on chart review, discussion with nursing staff, consultants, updating family and interview/physical exam; more than 50% of that time was spent in counseling and/or coordination of care.    Deaira Leckey J [place] DO [facility] Available via Epic secure chat 7am-7pm After these hours, please refer to coverage provider listed on [email or web address] 11/13/2021, 1:32 PM

## 2021-11-13 NOTE — Progress Notes (Addendum)
  Subjective:  Patient ID: KEALY LEWTER, female    DOB: 1956/06/12,  MRN: [id number]  Patient seen bedside. Doing well post-operatively. Denies complaints. Objective:   Vitals:   11/13/21 1355 11/13/21 1356  BP: (!) 150/135 (!) 120/51  Pulse: 74 74  Resp: 18   Temp: 98.3 F (36.8 C)   SpO2: 97%    Objective: AAO x3  Left foot dressing c/d/I No swelling or ascending cellulitis LLE No pain with calf compression Assessment & Plan:  Patient was evaluated and treated and all questions answered.  Left foot osteomyelitis -Antibiotics: continue empiric, awaiting culture -WB Status: heel WB LLE in CAM boot. Order placed. -Wound Care: Aquacell and DSD to wound. Change daily starting tomorrow. Order placed. -Surgical Plan: no further plans for surgery at this time -Disposition: will be ok for d/c when cultures finalized. Will likely need 6 weeks IV abx for osteomyelitis. -Will continue to follow.  Park Liter, DPM  Accessible via secure chat for questions or concerns.

## 2021-11-13 NOTE — Plan of Care (Signed)
  Problem: Education: Goal: Knowledge of General Education information will improve Description: Including pain rating scale, medication(s)/side effects and non-pharmacologic comfort measures Outcome: Progressing   Problem: Pain Managment: Goal: General experience of comfort will improve Outcome: Progressing   Problem: Safety: Goal: Ability to remain free from injury will improve Outcome: Progressing   

## 2021-11-13 NOTE — Progress Notes (Signed)
Pharmacy Antibiotic Note  Ariel Arnold is a 65 y.o. female admitted on 11/09/2021 with osteomyelitis, s/p  left 1st metatarsal resection, wound excision and closure, and application of skin graft substitute on 11/12/2021. Pharmacy has been consulted for Vancomyin and Cefepime dosing.  Plan: Continue Vancomycin 1500mg  IV q24h  Check Vancomycin levels soon Continue Cefepime 2g IV q8h Monitor renal function, cultures, clinical course, duration of therapy  Height: 5\' 8"  (172.7 cm) Weight: 86.5 kg (190 lb 11.2 oz) IBW/kg (Calculated) : 63.9  Temp (24hrs), Avg:98.2 F (36.8 C), Min:97.6 F (36.4 C), Max:98.6 F (37 C)  Recent Labs  Lab 11/09/21 2013 11/10/21 0832 11/11/21 0330 11/12/21 0302 11/13/21 0317  WBC 1.5* 1.6* 1.8* 2.2* 2.2*  CREATININE 0.89 0.71 0.79 0.76 0.81     Estimated Creatinine Clearance: 77.6 mL/min (by C-G formula based on SCr of 0.81 mg/dL).    Allergies  Allergen Reactions   Chlorhexidine Rash   Tape Rash    Antimicrobials this admission: 11/22 Vancomycin >>  11/23 Cefepime >>  Dose adjustments this admission: --  Microbiology results: 11/25 1st metatarsal tissue: pending    Thank you for allowing pharmacy to be a part of this patient's care.   12/23, PharmD, BCPS Clinical Pharmacist  11/13/2021 1:25 PM

## 2021-11-13 NOTE — Progress Notes (Signed)
Orthopedic Tech Progress Note Patient Details:  KILLIAN RESS [birth year] [id number]  Ortho Devices Type of Ortho Device: CAM walker Ortho Device/Splint Location: left Ortho Device/Splint Interventions: Application   Post Interventions Patient Tolerated: Well Instructions Provided: Care of device  Saul Fordyce 11/13/2021, 2:25 PM

## 2021-11-14 DIAGNOSIS — M86672 Other chronic osteomyelitis, left ankle and foot: Secondary | ICD-10-CM | POA: Diagnosis not present

## 2021-11-14 LAB — GLUCOSE, CAPILLARY
Glucose-Capillary: 173 mg/dL — ABNORMAL HIGH (ref 70–99)
Glucose-Capillary: 134 mg/dL — ABNORMAL HIGH (ref 70–99)
Glucose-Capillary: 258 mg/dL — ABNORMAL HIGH (ref 70–99)
Glucose-Capillary: 156 mg/dL — ABNORMAL HIGH (ref 70–99)

## 2021-11-14 LAB — BASIC METABOLIC PANEL
Anion gap: 6 (ref 5–15)
BUN: 23 mg/dL (ref 8–23)
CO2: 21 mmol/L — ABNORMAL LOW (ref 22–32)
Calcium: 8.5 mg/dL — ABNORMAL LOW (ref 8.9–10.3)
Chloride: 106 mmol/L (ref 98–111)
Creatinine, Ser: 0.76 mg/dL (ref 0.44–1.00)
GFR, Estimated: >60 mL/min (ref >60)
Glucose, Bld: 178 mg/dL — ABNORMAL HIGH (ref 70–99)
Potassium: 4.3 mmol/L (ref 3.5–5.1)
Sodium: 133 mmol/L — ABNORMAL LOW (ref 135–145)

## 2021-11-14 MED ORDER — INSULIN GLARGINE-YFGN 100 UNIT/ML ~~LOC~~ SOLN
40.0000 [IU] | Freq: Every day | SUBCUTANEOUS | Status: DC
Start: 2021-11-15 — End: 2021-11-16
  Administered 2021-11-15: 09:00:00 40 [IU] via SUBCUTANEOUS
  Filled 2021-11-14 (×2): qty 0.4

## 2021-11-14 NOTE — Progress Notes (Signed)
PROGRESS NOTE    Ariel Arnold  [id number] DOB: 08-Apr-1956 DOA: 11/09/2021 PCP: Jacqualine Code, DO    Brief Narrative:  Ariel Arnold is a 65 year old female with past medical history significant for type 2 diabetes mellitus, recurrent osteomyelitis of left foot s/p amputation with ongoing wound, essential hypertension, NASH cirrhosis, peripheral vascular disease, history of AVMs with prior GI bleeding, pancytopenia, depression who presents to [facility] ED for worsening left foot swelling, redness and drainage.  Patient was seen by her podiatrist earlier in the day with recommendations of ED evaluation for hospital admission.  Patient has been on multiple rounds of antibiotics for osteomyelitis and finished IV vancomycin about 2-3 weeks ago and continued on oral ciprofloxacin until last week.  She denies any fevers or chills, no shortness of breath, no nausea/vomiting/diarrhea.    Patient also reports she ran out of her insulin over a week ago and is waiting for her insurance to approve her insulin at the pharmacy for her to pick up.  In the ED, temperature 97.7 F, HR 86, RR 16, BP 149/63, SPO2 100% on room air.  Sodium 134, potassium 3.9, chloride 101, CO2 24, glucose 65, anion gap 9, BUN 19, creatinine 0.89.  AST 86, ALT 59.  WBC 1.5, hemoglobin 8.5, platelets 57.  CRP 0.9.  Cova-19 PCR negative.  Influenza A/B PCR negative.  Left foot x-ray with soft tissue ulcer along the medial distal stump, lucency/irregularity of the residual distal first metatarsal, diffuse soft tissue swelling of the foot.  Podiatry was consulted.  Hospital service consulted for further evaluation and management of acute diabetic foot infection with likely concern of acute osteomyelitis.   Assessment & Plan:   Principal Problem:   Chronic osteomyelitis involving ankle and foot, left (HCC) Active Problems:   Liver cirrhosis secondary to NASH (HCC)   Depressive disorder   Uncontrolled type 2 diabetes mellitus  with hyperglycemia (HCC)   Essential hypertension   Pancytopenia (HCC)   Acute on chronic osteomyelitis left foot Diabetic foot infection Patient presenting to ED by direction of her podiatrist for worsening left foot wound concerning for acute on chronic osteomyelitis.  Patient completed outpatient course of IV vancomycin followed by oral ciprofloxacin without improvement.  X-ray left foot with soft tissue ulcer medial distal stump with lucency irregularity of the residual distal first metatarsal and diffuse soft tissue swelling of the foot.  MRI left foot with and without contrast with soft tissue ulceration distal medial foot stump with tract extending to the distal first metatarsal and surrounding cellulitis, acute osteomyelitis of the first metatarsal.  Podiatry was consulted and patient underwent left first metatarsal resection with wound excision/closure and skin graft by Dr. March Rummage on 11/12/2021. --Vancomycin, pharmacy consulted for dosing/monitoring --Cefepime 2 g IV q8h --Operative culture, no organisms on gram stain, finalized report pending --Dressing changes per podiatry --Heel weightbearing LLE with cam walker boot --Likely need 6 weeks IV antibiotics pending culture results, will continue broad-spectrum for now per podiatry; if nothing grows from culture, will likely need ID input into antibiotic choice on discharge  Type 2 diabetes mellitus, with hyperglycemia Hemoglobin A1c 9.4, poorly controlled.  Patient reports has been out of her insulin; at baseline on Toujeo 65 units every qAM/qPM, metformin 500 mg p.o. twice daily. --Diabetic educator following, appreciate assistance --Semglee 40 units subcutaneously daily --Moderate SSI for coverage --CBGs qAC/HS  Hyperkalemia: Resolved Potassium 5.3 on 11/26.  S/p IV fluid hydration. --K 5.3>4.3 --Continue to hold spironolactone  Hyponatremia Sodium  130 this morning, likely secondary to viral depletion from prolonged n.p.o. status  from surgery yesterday. --Na 130>133 --IV fluid hydration  Essential hypertension --Carvedilol 6.25 mg p.o. twice daily --Holding spironolactone as above for hyperkalemia. --Hydralazine 25 mg p.o. q6h PRN SBP >170 or DBP >110  Hx GI bleed/AVM Iron deficiency anemia --Hgb 8.5>7.6>7.8>8.2>7.9 --Ferrous sulfate 325 mg p.o. daily --CBC daily  Depression/anxiety: Sertraline 150 mg p.o. daily  Neuropathy: Gabapentin 600 mg p.o. TID  HLD: Pravastatin 20 mg p.o. nightly  Nash cirrhosis --Spironolactone 50 mg p.o. every morning     DVT prophylaxis: SCDs Start: 11/10/21 0806   Code Status: Full Code Family Communication: No family present at bedside this morning  Disposition Plan:  Level of care: Med-Surg Status is: Inpatient  Remains inpatient appropriate because: Awaiting operative cultures for anticipated prolonged home IV antibiotic therapy.    Consultants:  Podiatry, Dr. March Rummage  Procedures:  Left first metatarsal resection, wound excision and closure with skin graft; Dr. March Rummage 11/25  Antimicrobials:  Vancomycin 11/22>> Cefepime 11/23>>    Subjective: Patient seen examined bedside, resting comfortably.  No complaints this morning.  Pain controlled.  Continues on empiric antibiotics awaiting operative culture results; anticipate 6-week IV antibiotic course per podiatry.  Denies headache, no fever/chills/night sweats, no nausea/vomiting/diarrhea, no chest pain, no palpitations, no shortness of breath, no abdominal pain, no weakness, no fatigue, no paresthesias.  No acute events overnight per nursing staff.  Objective: Vitals:   11/13/21 1356 11/13/21 2257 11/14/21 0447 11/14/21 0622  BP: (!) 120/51 111/61 (!) 97/40 (!) 108/44  Pulse: 74 65 65 61  Resp:  16 13 14   Temp:  98.3 F (36.8 C) 97.9 F (36.6 C) 98.5 F (36.9 C)  TempSrc:  Oral Oral Oral  SpO2:  100% 98% 96%  Weight:      Height:        Intake/Output Summary (Last 24 hours) at 11/14/2021 0957 Last  data filed at 11/14/2021 [id number] Gross per 24 hour  Intake 1140 ml  Output --  Net 1140 ml   Filed Weights   11/09/21 1945 11/10/21 0046  Weight: 86.2 kg 86.5 kg    Examination:  General exam: Appears calm and comfortable, appears older than stated age Respiratory system: Clear to auscultation. Respiratory effort normal.  On room air Cardiovascular system: S1 & S2 heard, RRR. No JVD, murmurs, rubs, gallops or clicks. No pedal edema. Gastrointestinal system: Abdomen is nondistended, soft and nontender. No organomegaly or masses felt. Normal bowel sounds heard. Central nervous system: Alert and oriented. No focal neurological deficits. Extremities: Symmetric 5 x 5 power. Skin: Left foot with dressing/Ace wrap in place, clean/dry/intact, otherwise no rashes, lesions or ulcers Psychiatry: Judgement and insight appear normal. Mood & affect appropriate.     Data Reviewed: I have personally reviewed following labs and imaging studies  CBC: Recent Labs  Lab 11/09/21 2013 11/10/21 0832 11/11/21 0330 11/12/21 0302 11/13/21 0317  WBC 1.5* 1.6* 1.8* 2.2* 2.2*  NEUTROABS 0.8*  --   --   --   --   HGB 8.5* 7.6* 7.8* 8.2* 7.9*  HCT 27.2* 24.3* 24.9* 26.4* 26.0*  MCV 93.8 93.5 94.0 93.6 95.2  PLT 57* 53* 57* 63* 56*   Basic Metabolic Panel: Recent Labs  Lab 11/10/21 0832 11/11/21 0330 11/12/21 0302 11/13/21 0317 11/13/21 1121 11/14/21 0328  NA 138 136 135 130*  --  133*  K 3.8 3.8 4.8 5.3* 4.4 4.3  CL 108 106 107 103  --  106  CO2 25 22 23  21*  --  21*  GLUCOSE 134* 288* 119* 260*  --  178*  BUN 18 21 23  24*  --  23  CREATININE 0.71 0.79 0.76 0.81  --  0.76  CALCIUM 8.3* 8.3* 8.6* 8.5*  --  8.5*  MG  --   --   --  1.6*  --   --    GFR: Estimated Creatinine Clearance: 78.5 mL/min (by C-G formula based on SCr of 0.76 mg/dL). Liver Function Tests: Recent Labs  Lab 11/09/21 2013  AST 86*  ALT 59*  ALKPHOS 124  BILITOT 0.9  PROT 7.0  ALBUMIN 3.3*   No results for  input(s): LIPASE, AMYLASE in the last 168 hours. No results for input(s): AMMONIA in the last 168 hours. Coagulation Profile: No results for input(s): INR, PROTIME in the last 168 hours. Cardiac Enzymes: No results for input(s): CKTOTAL, CKMB, CKMBINDEX, TROPONINI in the last 168 hours. BNP (last 3 results) No results for input(s): PROBNP in the last 8760 hours. HbA1C: No results for input(s): HGBA1C in the last 72 hours.  CBG: Recent Labs  Lab 11/13/21 0737 11/13/21 1125 11/13/21 1541 11/13/21 2255 11/14/21 0736  GLUCAP 196* 226* 266* 203* 173*   Lipid Profile: No results for input(s): CHOL, HDL, LDLCALC, TRIG, CHOLHDL, LDLDIRECT in the last 72 hours. Thyroid Function Tests: No results for input(s): TSH, T4TOTAL, FREET4, T3FREE, THYROIDAB in the last 72 hours. Anemia Panel: No results for input(s): VITAMINB12, FOLATE, FERRITIN, TIBC, IRON, RETICCTPCT in the last 72 hours. Sepsis Labs: No results for input(s): PROCALCITON, LATICACIDVEN in the last 168 hours.  Recent Results (from the past 240 hour(s))  Resp Panel by RT-PCR (Flu A&B, Covid) Nasopharyngeal Swab     Status: None   Collection Time: 11/09/21  9:19 PM   Specimen: Nasopharyngeal Swab; Nasopharyngeal(NP) swabs in vial transport medium  Result Value Ref Range Status   SARS Coronavirus 2 by RT PCR NEGATIVE NEGATIVE Final    Comment: (NOTE) SARS-CoV-2 target nucleic acids are NOT DETECTED.  The SARS-CoV-2 RNA is generally detectable in upper respiratory specimens during the acute phase of infection. The lowest concentration of SARS-CoV-2 viral copies this assay can detect is 138 copies/mL. A negative result does not preclude SARS-Cov-2 infection and should not be used as the sole basis for treatment or other patient management decisions. A negative result may occur with  improper specimen collection/handling, submission of specimen other than nasopharyngeal swab, presence of viral mutation(s) within the areas  targeted by this assay, and inadequate number of viral copies(<138 copies/mL). A negative result must be combined with clinical observations, patient history, and epidemiological information. The expected result is Negative.  Fact Sheet for Patients:  11/16/21  Fact Sheet for Healthcare Providers:  11/11/21  This test is no t yet approved or cleared by the [employer] FDA and  has been authorized for detection and/or diagnosis of SARS-CoV-2 by FDA under an Emergency Use Authorization (EUA). This EUA will remain  in effect (meaning this test can be used) for the duration of the COVID-19 declaration under Section 564(b)(1) of the Act, 21 U.S.C.section 360bbb-3(b)(1), unless the authorization is terminated  or revoked sooner.       Influenza A by PCR NEGATIVE NEGATIVE Final   Influenza B by PCR NEGATIVE NEGATIVE Final    Comment: (NOTE) The Xpert Xpress SARS-CoV-2/FLU/RSV plus assay is intended as an aid in the diagnosis of influenza from Nasopharyngeal swab specimens and should not be used as a  sole basis for treatment. Nasal washings and aspirates are unacceptable for Xpert Xpress SARS-CoV-2/FLU/RSV testing.  Fact Sheet for Patients: EntrepreneurPulse.com.au  Fact Sheet for Healthcare Providers: IncredibleEmployment.be  This test is not yet approved or cleared by the [place] FDA and has been authorized for detection and/or diagnosis of SARS-CoV-2 by FDA under an Emergency Use Authorization (EUA). This EUA will remain in effect (meaning this test can be used) for the duration of the COVID-19 declaration under Section 564(b)(1) of the Act, 21 U.S.C. section 360bbb-3(b)(1), unless the authorization is terminated or revoked.  Performed at [facility], [place] [address] [place], [place] [address]   Aerobic/Anaerobic Culture w Gram Stain (surgical/deep  wound)     Status: None (Preliminary result)   Collection Time: 11/12/21  1:39 PM   Specimen: PATH Soft tissue  Result Value Ref Range Status   Specimen Description   Final    TISSUE Performed at [facility] [address], [place], [place] [address]    Special Requests 1ST METATARSAL  Final   Gram Stain   Final    RARE WBC PRESENT, PREDOMINANTLY MONONUCLEAR NO ORGANISMS SEEN    Culture   Final    TOO YOUNG TO READ Performed at [facility], [place] [address], [place], [place] [address]    Report Status PENDING  Incomplete         Radiology Studies: No results found.      Scheduled Meds:  carvedilol  6.25 mg Oral BID   ferrous sulfate  325 mg Oral Q breakfast   folic acid  1 mg Oral BID   gabapentin  600 mg Oral TID   insulin aspart  0-15 Units Subcutaneous TID WC   insulin aspart  0-5 Units Subcutaneous QHS   insulin glargine-yfgn  35 Units Subcutaneous Daily   sertraline  150 mg Oral Daily   Continuous Infusions:  sodium chloride     ceFEPime (MAXIPIME) IV 2 g (11/14/21 0840)   vancomycin 1,500 mg (11/13/21 2258)     LOS: 5 days    Time spent: 37 minutes spent on chart review, discussion with nursing staff, consultants, updating family and interview/physical exam; more than 50% of that time was spent in counseling and/or coordination of care.    Araya Roel J [place] DO [facility] Available via Epic secure chat 7am-7pm After these hours, please refer to coverage provider listed on [email or web address] 11/14/2021, 9:57 AM

## 2021-11-14 NOTE — Plan of Care (Signed)

## 2021-11-14 NOTE — Progress Notes (Signed)
   11/14/21 0447  Assess: MEWS Score  Temp 97.9 F (36.6 C)  BP (!) 97/40  Pulse Rate 65  Resp 13  SpO2 98 %  O2 Device Room Air  Assess: MEWS Score  MEWS Temp 0  MEWS Systolic 1  MEWS Pulse 0  MEWS RR 1  MEWS LOC 0  MEWS Score 2  MEWS Score Color Yellow  Assess: if the MEWS score is Yellow or Red  Were vital signs taken at a resting state? Yes  Focused Assessment No change from prior assessment  Does the patient meet 2 or more of the SIRS criteria? No  MEWS guidelines implemented *See Row Information* No, vital signs rechecked  Treat  Pain Scale 0-10  Pain Score 0  Notify: Charge Nurse/RN  Name of Charge Nurse/RN Notified discussed with RN Suzette Battiest  Assess: SIRS CRITERIA  SIRS Temperature  0  SIRS Pulse 0  SIRS Respirations  0  SIRS WBC 0  SIRS Score Sum  0  Pt assessed and vital signs taken after, pt was alert and oriented. No changes from baseline. Will continue to monitor vitals Q4.

## 2021-11-15 DIAGNOSIS — M86672 Other chronic osteomyelitis, left ankle and foot: Secondary | ICD-10-CM | POA: Diagnosis not present

## 2021-11-15 LAB — GLUCOSE, CAPILLARY
Glucose-Capillary: 174 mg/dL — ABNORMAL HIGH (ref 70–99)
Glucose-Capillary: 153 mg/dL — ABNORMAL HIGH (ref 70–99)
Glucose-Capillary: 208 mg/dL — ABNORMAL HIGH (ref 70–99)
Glucose-Capillary: 142 mg/dL — ABNORMAL HIGH (ref 70–99)

## 2021-11-15 LAB — VANCOMYCIN, PEAK: Vancomycin Pk: 47 ug/mL — ABNORMAL HIGH (ref 30–40)

## 2021-11-15 LAB — SURGICAL PATHOLOGY

## 2021-11-15 LAB — MRSA NEXT GEN BY PCR, NASAL: MRSA by PCR Next Gen: NOT DETECTED

## 2021-11-15 NOTE — Progress Notes (Signed)
PROGRESS NOTE    ALVITA FANA  HWE:[id number] DOB: 02-May-1956 DOA: 11/09/2021 PCP: Joaquin Courts, DO    Brief Narrative:  Ariel Arnold is a 65 year old female with past medical history significant for type 2 diabetes mellitus, recurrent osteomyelitis of left foot s/p amputation with ongoing wound, essential hypertension, NASH cirrhosis, peripheral vascular disease, history of AVMs with prior GI bleeding, pancytopenia, depression who presents to [facility] ED for worsening left foot swelling, redness and drainage.  Patient was seen by her podiatrist earlier in the day with recommendations of ED evaluation for hospital admission.  Patient has been on multiple rounds of antibiotics for osteomyelitis and finished IV vancomycin about 2-3 weeks ago and continued on oral ciprofloxacin until last week.  She denies any fevers or chills, no shortness of breath, no nausea/vomiting/diarrhea.    Patient also reports she ran out of her insulin over a week ago and is waiting for her insurance to approve her insulin at the pharmacy for her to pick up.  In the ED, temperature 97.7 F, HR 86, RR 16, BP 149/63, SPO2 100% on room air.  Sodium 134, potassium 3.9, chloride 101, CO2 24, glucose 65, anion gap 9, BUN 19, creatinine 0.89.  AST 86, ALT 59.  WBC 1.5, hemoglobin 8.5, platelets 57.  CRP 0.9.  Cova-19 PCR negative.  Influenza A/B PCR negative.  Left foot x-ray with soft tissue ulcer along the medial distal stump, lucency/irregularity of the residual distal first metatarsal, diffuse soft tissue swelling of the foot.  Podiatry was consulted.  Hospital service consulted for further evaluation and management of acute diabetic foot infection with likely concern of acute osteomyelitis.   Assessment & Plan:   Principal Problem:   Chronic osteomyelitis involving ankle and foot, left (HCC) Active Problems:   Liver cirrhosis secondary to NASH (HCC)   Depressive disorder   Uncontrolled type 2 diabetes mellitus  with hyperglycemia (HCC)   Essential hypertension   Pancytopenia (HCC)   Acute on chronic osteomyelitis left foot Diabetic foot infection Patient presenting to ED by direction of her podiatrist for worsening left foot wound concerning for acute on chronic osteomyelitis.  Patient completed outpatient course of IV vancomycin followed by oral ciprofloxacin without improvement.  X-ray left foot with soft tissue ulcer medial distal stump with lucency irregularity of the residual distal first metatarsal and diffuse soft tissue swelling of the foot.  MRI left foot with and without contrast with soft tissue ulceration distal medial foot stump with tract extending to the distal first metatarsal and surrounding cellulitis, acute osteomyelitis of the first metatarsal.  Podiatry was consulted and patient underwent left first metatarsal resection with wound excision/closure and skin graft by Dr. Samuella Cota on 11/12/2021. --Vancomycin, pharmacy consulted for dosing/monitoring --Cefepime 2 g IV q8h --Operative culture: MRSA; susceptibilities pending --Dressing changes per podiatry --Heel weightbearing LLE with cam walker boot --Likely need 6 weeks IV antibiotics pending culture results  Type 2 diabetes mellitus, with hyperglycemia Hemoglobin A1c 9.4, poorly controlled.  Patient reports has been out of her insulin; at baseline on Toujeo 65 units every qAM/qPM, metformin 500 mg p.o. twice daily. --Diabetic educator following, appreciate assistance --Semglee 40 units subcutaneously daily --Moderate SSI for coverage --CBGs qAC/HS  Hyperkalemia: Resolved Potassium 5.3 on 11/26.  S/p IV fluid hydration. --K 5.3>4.3 --Continue to hold spironolactone  Hyponatremia Sodium 130 this morning, likely secondary to viral depletion from prolonged n.p.o. status from surgery. --Na 130>133 --IV fluid hydration  Essential hypertension --Carvedilol 6.25 mg p.o. twice  daily --Holding spironolactone as above for  hyperkalemia. --Hydralazine 25 mg p.o. q6h PRN SBP >170 or DBP >110  Hx GI bleed/AVM Iron deficiency anemia --Hgb 8.5>7.6>7.8>8.2>7.9 --Ferrous sulfate 325 mg p.o. daily --CBC daily  Depression/anxiety: Sertraline 150 mg p.o. daily  Neuropathy: Gabapentin 600 mg p.o. TID  HLD: Pravastatin 20 mg p.o. nightly  Nash cirrhosis On Spironolactone 50 mg p.o. every morning at home; discontinued as above     DVT prophylaxis: SCDs Start: 11/10/21 0806   Code Status: Full Code Family Communication: No family present at bedside this morning  Disposition Plan:  Level of care: Med-Surg Status is: Inpatient  Remains inpatient appropriate because: Awaiting operative cultures for anticipated prolonged home IV antibiotic therapy.    Consultants:  Podiatry, Dr. March Rummage  Procedures:  Left first metatarsal resection, wound excision and closure with skin graft; Dr. March Rummage 11/25  Antimicrobials:  Vancomycin 11/22>> Cefepime 11/23>>    Subjective: Patient seen examined bedside, resting comfortably.  Lying in bed.  Pain controlled.  No questions, concerns or complaints this morning.  Operative culture showing MRSA, awaiting susceptibilities; anticipate 6-week IV antibiotic course per podiatry.  Denies headache, no fever/chills/night sweats, no nausea/vomiting/diarrhea, no chest pain, no palpitations, no shortness of breath, no abdominal pain, no weakness, no fatigue, no paresthesias.  No acute events overnight per nursing staff.  Objective: Vitals:   11/14/21 1404 11/14/21 2128 11/15/21 0636 11/15/21 0814  BP: (!) 120/58 (!) 119/52 (!) 88/56 (!) 122/45  Pulse: 65 66 67 71  Resp: 17 16  14   Temp: 97.8 F (36.6 C) 98 F (36.7 C) 98 F (36.7 C) 98.1 F (36.7 C)  TempSrc:  Oral Oral Oral  SpO2: 97% 99% 100% 98%  Weight:      Height:        Intake/Output Summary (Last 24 hours) at 11/15/2021 1104 Last data filed at 11/15/2021 1036 Gross per 24 hour  Intake 1957.21 ml  Output --   Net 1957.21 ml   Filed Weights   11/09/21 1945 11/10/21 0046  Weight: 86.2 kg 86.5 kg    Examination:  General exam: Appears calm and comfortable, appears older than stated age Respiratory system: Clear to auscultation. Respiratory effort normal.  On room air Cardiovascular system: S1 & S2 heard, RRR. No JVD, murmurs, rubs, gallops or clicks. No pedal edema. Gastrointestinal system: Abdomen is nondistended, soft and nontender. No organomegaly or masses felt. Normal bowel sounds heard. Central nervous system: Alert and oriented. No focal neurological deficits. Extremities: Symmetric 5 x 5 power. Skin: Left foot with dressing/Ace wrap in place, clean/dry/intact, otherwise no rashes, lesions or ulcers Psychiatry: Judgement and insight appear normal. Mood & affect appropriate.     Data Reviewed: I have personally reviewed following labs and imaging studies  CBC: Recent Labs  Lab 11/09/21 2013 11/10/21 0832 11/11/21 0330 11/12/21 0302 11/13/21 0317  WBC 1.5* 1.6* 1.8* 2.2* 2.2*  NEUTROABS 0.8*  --   --   --   --   HGB 8.5* 7.6* 7.8* 8.2* 7.9*  HCT 27.2* 24.3* 24.9* 26.4* 26.0*  MCV 93.8 93.5 94.0 93.6 95.2  PLT 57* 53* 57* 63* 56*   Basic Metabolic Panel: Recent Labs  Lab 11/10/21 0832 11/11/21 0330 11/12/21 0302 11/13/21 0317 11/13/21 1121 11/14/21 0328  NA 138 136 135 130*  --  133*  K 3.8 3.8 4.8 5.3* 4.4 4.3  CL 108 106 107 103  --  106  CO2 25 22 23  21*  --  21*  GLUCOSE 134*  288* 119* 260*  --  178*  BUN 18 21 23  24*  --  23  CREATININE 0.71 0.79 0.76 0.81  --  0.76  CALCIUM 8.3* 8.3* 8.6* 8.5*  --  8.5*  MG  --   --   --  1.6*  --   --    GFR: Estimated Creatinine Clearance: 78.5 mL/min (by C-G formula based on SCr of 0.76 mg/dL). Liver Function Tests: Recent Labs  Lab 11/09/21 2013  AST 86*  ALT 59*  ALKPHOS 124  BILITOT 0.9  PROT 7.0  ALBUMIN 3.3*   No results for input(s): LIPASE, AMYLASE in the last 168 hours. No results for input(s):  AMMONIA in the last 168 hours. Coagulation Profile: No results for input(s): INR, PROTIME in the last 168 hours. Cardiac Enzymes: No results for input(s): CKTOTAL, CKMB, CKMBINDEX, TROPONINI in the last 168 hours. BNP (last 3 results) No results for input(s): PROBNP in the last 8760 hours. HbA1C: No results for input(s): HGBA1C in the last 72 hours.  CBG: Recent Labs  Lab 11/14/21 0736 11/14/21 1124 11/14/21 1650 11/14/21 2124 11/15/21 0728  GLUCAP 173* 134* 258* 156* 174*   Lipid Profile: No results for input(s): CHOL, HDL, LDLCALC, TRIG, CHOLHDL, LDLDIRECT in the last 72 hours. Thyroid Function Tests: No results for input(s): TSH, T4TOTAL, FREET4, T3FREE, THYROIDAB in the last 72 hours. Anemia Panel: No results for input(s): VITAMINB12, FOLATE, FERRITIN, TIBC, IRON, RETICCTPCT in the last 72 hours. Sepsis Labs: No results for input(s): PROCALCITON, LATICACIDVEN in the last 168 hours.  Recent Results (from the past 240 hour(s))  Resp Panel by RT-PCR (Flu A&B, Covid) Nasopharyngeal Swab     Status: None   Collection Time: 11/09/21  9:19 PM   Specimen: Nasopharyngeal Swab; Nasopharyngeal(NP) swabs in vial transport medium  Result Value Ref Range Status   SARS Coronavirus 2 by RT PCR NEGATIVE NEGATIVE Final    Comment: (NOTE) SARS-CoV-2 target nucleic acids are NOT DETECTED.  The SARS-CoV-2 RNA is generally detectable in upper respiratory specimens during the acute phase of infection. The lowest concentration of SARS-CoV-2 viral copies this assay can detect is 138 copies/mL. A negative result does not preclude SARS-Cov-2 infection and should not be used as the sole basis for treatment or other patient management decisions. A negative result may occur with  improper specimen collection/handling, submission of specimen other than nasopharyngeal swab, presence of viral mutation(s) within the areas targeted by this assay, and inadequate number of viral copies(<138 copies/mL).  A negative result must be combined with clinical observations, patient history, and epidemiological information. The expected result is Negative.  Fact Sheet for Patients:  EntrepreneurPulse.com.au  Fact Sheet for Healthcare Providers:  IncredibleEmployment.be  This test is no t yet approved or cleared by the [place] FDA and  has been authorized for detection and/or diagnosis of SARS-CoV-2 by FDA under an Emergency Use Authorization (EUA). This EUA will remain  in effect (meaning this test can be used) for the duration of the COVID-19 declaration under Section 564(b)(1) of the Act, 21 U.S.C.section 360bbb-3(b)(1), unless the authorization is terminated  or revoked sooner.       Influenza A by PCR NEGATIVE NEGATIVE Final   Influenza B by PCR NEGATIVE NEGATIVE Final    Comment: (NOTE) The Xpert Xpress SARS-CoV-2/FLU/RSV plus assay is intended as an aid in the diagnosis of influenza from Nasopharyngeal swab specimens and should not be used as a sole basis for treatment. Nasal washings and aspirates are unacceptable for Xpert  Xpress SARS-CoV-2/FLU/RSV testing.  Fact Sheet for Patients: EntrepreneurPulse.com.au  Fact Sheet for Healthcare Providers: IncredibleEmployment.be  This test is not yet approved or cleared by the [place] FDA and has been authorized for detection and/or diagnosis of SARS-CoV-2 by FDA under an Emergency Use Authorization (EUA). This EUA will remain in effect (meaning this test can be used) for the duration of the COVID-19 declaration under Section 564(b)(1) of the Act, 21 U.S.C. section 360bbb-3(b)(1), unless the authorization is terminated or revoked.  Performed at [facility], [place] [address] [place], [place] [address]   Aerobic/Anaerobic Culture w Gram Stain (surgical/deep wound)     Status: None (Preliminary result)   Collection Time: 11/12/21  1:39  PM   Specimen: PATH Soft tissue  Result Value Ref Range Status   Specimen Description WOUND  Final   Special Requests 1ST METATARSAL  Final   Gram Stain   Final    RARE WBC PRESENT, PREDOMINANTLY MONONUCLEAR NO ORGANISMS SEEN Performed at [facility], [place] [address] [place],  [address]    Culture   Final    RARE METHICILLIN RESISTANT STAPHYLOCOCCUS AUREUS WITHIN MIXED ORGANISMS NO ANAEROBES ISOLATED; CULTURE IN PROGRESS FOR 5 DAYS    Report Status PENDING  Incomplete   Organism ID, Bacteria METHICILLIN RESISTANT STAPHYLOCOCCUS AUREUS  Final      Susceptibility   Methicillin resistant staphylococcus aureus - MIC*    CIPROFLOXACIN 2 INTERMEDIATE Intermediate     ERYTHROMYCIN >=8 RESISTANT Resistant     GENTAMICIN <=0.5 SENSITIVE Sensitive     OXACILLIN >=4 RESISTANT Resistant     TETRACYCLINE <=1 SENSITIVE Sensitive     VANCOMYCIN 1 SENSITIVE Sensitive     TRIMETH/SULFA <=10 SENSITIVE Sensitive     CLINDAMYCIN <=0.25 SENSITIVE Sensitive     RIFAMPIN <=0.5 SENSITIVE Sensitive     Inducible Clindamycin NEGATIVE Sensitive     * RARE METHICILLIN RESISTANT STAPHYLOCOCCUS AUREUS         Radiology Studies: No results found.      Scheduled Meds:  carvedilol  6.25 mg Oral BID   ferrous sulfate  325 mg Oral Q breakfast   folic acid  1 mg Oral BID   gabapentin  600 mg Oral TID   insulin aspart  0-15 Units Subcutaneous TID WC   insulin aspart  0-5 Units Subcutaneous QHS   insulin glargine-yfgn  40 Units Subcutaneous Daily   sertraline  150 mg Oral Daily   Continuous Infusions:  sodium chloride Stopped (11/15/21 0902)   ceFEPime (MAXIPIME) IV Stopped (11/15/21 0900)   vancomycin 1,500 mg (11/14/21 2201)     LOS: 6 days    Time spent: 35 minutes spent on chart review, discussion with nursing staff, consultants, updating family and interview/physical exam; more than 50% of that time was spent in counseling and/or coordination of care.    Tyquavious Gamel J [place]  DO [facility] Available via Epic secure chat 7am-7pm After these hours, please refer to coverage provider listed on [email or web address] 11/15/2021, 11:04 AM

## 2021-11-15 NOTE — Progress Notes (Signed)
Inpatient Diabetes Program Recommendations  AACE/ADA: New Consensus Statement on Inpatient Glycemic Control (2015)  Target Ranges:  Prepandial:   less than 140 mg/dL      Peak postprandial:   less than 180 mg/dL (1-2 hours)      Critically ill patients:  140 - 180 mg/dL   Lab Results  Component Value Date   GLUCAP 174 (H) 11/15/2021   HGBA1C 9.4 (H) 11/10/2021    Review of Glycemic Control  Latest Reference Range & Units 11/14/21 07:36 11/14/21 11:24 11/14/21 16:50 11/14/21 21:24 11/15/21 07:28  Glucose-Capillary 70 - 99 mg/dL 626 (H) 948 (H) 546 (H) 156 (H) 174 (H)   Diabetes history:DM 2 Outpatient Diabetes medications: Toujeo 65 units bid, Novolog 6 units tid, Metformin 500 mg bid Current orders for Inpatient glycemic control:  Semglee 40 units Novolog 0-15 units tid + hs  A1c 9.4% on 11/23 PCP: Dr. Cyril Loosen with pt at bedside regarding A1c and glucose control at home. Pt reports checking her glucose everyday and taking Toujeo 68 units bid and Novolog 6 units tid before meals, metformin 500 mg bid. Pt reports being on the [facility] for 2 weeks but needed pre-authorization for it. Pt sees her PCP every 1 week to every month depending on glucose control. Pt reports her glucose trends are anywhere from 75-475. She goes lower with activity and not eating as many carbs. Pt is limited in controlling her glucose trends by diet as she lives with her grandson and granddaughter in law and does not fix her own meals. She eat a lot of restaurant food chicken, rice and potatoes. Pt tries to buy more veggies but is also on limited income. Discussed importance of glucose control on wound healing. Encouraged checking glucose trends 3-4 times a day to help her PCP with insulin dosing. Pointed out the amount of insulin she is getting here and compared her glucose trends to home trends. Pt has all needed supplies at home for self management. Pt has no other needs at this  time.  Thanks,  Christena Deem RN, MSN, BC-ADM Inpatient Diabetes Coordinator Team Pager [phone] (8a-5p)

## 2021-11-15 NOTE — Progress Notes (Signed)
  Subjective:  Patient ID: Ariel Arnold, female    DOB: [birth year],  MRN: [id number]  Patient seen bedside. Doing well post-operatively. Denies complaints. Objective:   Vitals:   11/15/21 0636 11/15/21 0814  BP: (!) 88/56 (!) 122/45  Pulse: 67 71  Resp:  14  Temp: 98 F (36.7 C) 98.1 F (36.7 C)  SpO2: 100% 98%   Objective: AAO x3  Left foot dressing c/d/I No swelling or ascending cellulitis LLE No pain with calf compression Assessment & Plan:  Patient was evaluated and treated and all questions answered.  Left foot osteomyelitis -Antibiotics: Per primary team -WB Status: heel WB LLE in CAM boot.  -Wound Care: Continue Aquacel and dry sterile dressing to the wound. -Surgical Plan: no further plans for surgery at this time -Cultures: MRSA.  See sensitivity profile -Patient will benefit from infectious disease consult for 6 weeks of antibiotics based on the recommendations. -She will follow-up 1 week from discharge with Dr. Samuella Cota or Dr. Ardelle Anton.  Candelaria Stagers, DPM  Accessible via secure chat for questions or concerns.

## 2021-11-15 NOTE — Plan of Care (Signed)

## 2021-11-15 NOTE — TOC Initial Note (Signed)
Transition of Care [facility]) - Initial/Assessment Note   Patient Details  Name: Ariel Arnold MRN: [id number] Date of Birth: Jan 27, 1956  Transition of Care [facility]) CM/SW Contact:    Ewing Schlein, LCSW Phone Number: 11/15/2021, 9:44 AM  Clinical Narrative: Patient will need to discharge home on 6 weeks of IV antibiotics. CSW made IV antibiotics referral to [facility] with [facility]. As patient lives in [place], [place], patient will be set up with [facility] for [facility] services. CSW updated patient regarding IV antibiotics and HHRN being set up. TOC awaiting cultures.  Expected Discharge Plan: Home w Home Health Services Barriers to Discharge: Continued Medical Work up  Patient Goals and CMS Choice Patient states their goals for this hospitalization and ongoing recovery are:: Return home CMS Medicare.gov Compare Post Acute Care list provided to:: Patient Choice offered to / list presented to : Patient  Expected Discharge Plan and Services Expected Discharge Plan: Home w Home Health Services Post Acute Care Choice: Home Health Living arrangements for the past 2 months: Single Family Home          DME Arranged: N/A DME Agency: NA HH Arranged: RN, IV Antibiotics HH Agency: [employer] ([facility] to provide [facility] for IV antibiotics) Date HH Agency Contacted: 11/15/21 Time HH Agency Contacted: [phone] Representative spoke with at [facility] Agency: [name])  Prior Living Arrangements/Services Living arrangements for the past 2 months: Single Family Home Patient language and need for interpreter reviewed:: Yes Do you feel safe going back to the place where you live?: Yes      Need for Family Participation in Patient Care: No (Comment) Care giver support system in place?: Yes (comment) Criminal Activity/Legal Involvement Pertinent to Current Situation/Hospitalization: No - Comment as needed  Activities of Daily Living Home Assistive Devices/Equipment: Bedside commode/3-in-1, Walker (specify type), Cane (specify  quad or straight) ADL Screening (condition at time of admission) Patient's cognitive ability adequate to safely complete daily activities?: Yes Is the patient deaf or have difficulty hearing?: Yes Does the patient have difficulty seeing, even when wearing glasses/contacts?: Yes Does the patient have difficulty concentrating, remembering, or making decisions?: No Patient able to express need for assistance with ADLs?: Yes Does the patient have difficulty dressing or bathing?: No Independently performs ADLs?: Yes (appropriate for developmental age) Does the patient have difficulty walking or climbing stairs?: No Weakness of Legs: Both Weakness of Arms/Hands: Both  Permission Sought/Granted Permission sought to share information with : Other (comment) Permission granted to share information with : Yes, Verbal Permission Granted Permission granted to share info w AGENCY: [facility]  Emotional Assessment Attitude/Demeanor/Rapport: Engaged Affect (typically observed): Accepting Orientation: : Oriented to Self, Oriented to Place, Oriented to  Time, Oriented to Situation Alcohol / Substance Use: Not Applicable  Admission diagnosis:  Hyperglycemia [R73.9] Wound infection [T14.8XXA, L08.9] Chronic osteomyelitis involving ankle and foot, left (HCC) [Q25.956] Subacute osteomyelitis of left foot (HCC) [L87.564] Patient Active Problem List   Diagnosis Date Noted   Chronic osteomyelitis involving ankle and foot, left (HCC) 11/09/2021   Uncontrolled type 2 diabetes mellitus with hyperglycemia (HCC) 11/09/2021   Essential hypertension 11/09/2021   Pancytopenia (HCC) 11/09/2021   Pressure injury of skin 05/21/2021   Encephalopathy 05/20/2021   Hematemesis    Acute GI bleeding 05/19/2021   Liver cirrhosis secondary to NASH (HCC) 05/19/2021   Hepatic encephalopathy 05/19/2021   Thrombocytopenia (HCC) 05/19/2021   Acute osteomyelitis of left foot (HCC) 05/19/2021   Right calf pain 04/27/2021    Swelling of left lower extremity 04/19/2021  Encounter for coordination of complex care 04/02/2021   ABLA (acute blood loss anemia) 02/15/2021   Obesity (BMI 30-39.9) 02/15/2021   Acquired absence of left foot ([place]) 01/11/2021   Non-healing wound of amputation stump ([place]) 09/09/2020   Coronary atherosclerosis 08/04/2020   Colitis 07/16/2020   Melena 07/16/2020   Weak 07/16/2020   Localized edema 12/25/2019   Non-prs chronic ulcer oth prt right foot w fat layer exposed ([place]) 12/24/2019   Anemia in chronic kidney disease 12/20/2019   Acquired absence of other left toe(s) ([place]) 11/19/2019   Stage 2 chronic kidney disease due to type 2 diabetes mellitus ([place]) 08/01/2019   Chest pain 06/18/2019   Dyspnea on exertion [id number]   Nonalcoholic fatty liver disease 05/07/2019   Type 2 diabetes mellitus treated with insulin ([place]) 05/06/2019   Depressive disorder 04/18/2019   Generalized anxiety disorder 04/18/2019   Primary hypertension 04/18/2019   Seasonal allergic rhinitis 04/18/2019   Arthritis 04/18/2019   Mixed hyperlipidemia 12/19/2018   PCP:  Jacqualine Code, DO Pharmacy:   [id number] - [place], [place] [address] [place] [address] Phone: [phone] Fax: [phone]  Readmission Risk Interventions No flowsheet data found.

## 2021-11-16 ENCOUNTER — Inpatient Hospital Stay: Payer: Self-pay

## 2021-11-16 ENCOUNTER — Encounter (HOSPITAL_COMMUNITY): Payer: Self-pay | Admitting: Podiatry

## 2021-11-16 DIAGNOSIS — M86672 Other chronic osteomyelitis, left ankle and foot: Secondary | ICD-10-CM | POA: Diagnosis not present

## 2021-11-16 LAB — CBC
HCT: 24 % — ABNORMAL LOW (ref 36.0–46.0)
Hemoglobin: 7.3 g/dL — ABNORMAL LOW (ref 12.0–15.0)
MCH: 29 pg (ref 26.0–34.0)
MCHC: 30.4 g/dL (ref 30.0–36.0)
MCV: 95.2 fL (ref 80.0–100.0)
Platelets: 60 10*3/uL — ABNORMAL LOW (ref 150–400)
RBC: 2.52 MIL/uL — ABNORMAL LOW (ref 3.87–5.11)
RDW: 14.5 % (ref 11.5–15.5)
WBC: 1.9 10*3/uL — ABNORMAL LOW (ref 4.0–10.5)
nRBC: 0 % (ref 0.0–0.2)

## 2021-11-16 LAB — GLUCOSE, CAPILLARY
Glucose-Capillary: 139 mg/dL — ABNORMAL HIGH (ref 70–99)
Glucose-Capillary: 186 mg/dL — ABNORMAL HIGH (ref 70–99)
Glucose-Capillary: 250 mg/dL — ABNORMAL HIGH (ref 70–99)
Glucose-Capillary: 233 mg/dL — ABNORMAL HIGH (ref 70–99)

## 2021-11-16 LAB — BASIC METABOLIC PANEL
Anion gap: 4 — ABNORMAL LOW (ref 5–15)
BUN: 27 mg/dL — ABNORMAL HIGH (ref 8–23)
CO2: 20 mmol/L — ABNORMAL LOW (ref 22–32)
Calcium: 8.6 mg/dL — ABNORMAL LOW (ref 8.9–10.3)
Chloride: 111 mmol/L (ref 98–111)
Creatinine, Ser: 0.82 mg/dL (ref 0.44–1.00)
GFR, Estimated: >60 mL/min (ref >60)
Glucose, Bld: 181 mg/dL — ABNORMAL HIGH (ref 70–99)
Potassium: 4.8 mmol/L (ref 3.5–5.1)
Sodium: 135 mmol/L (ref 135–145)

## 2021-11-16 LAB — VANCOMYCIN, TROUGH: Vancomycin Tr: 20 ug/mL (ref 15–20)

## 2021-11-16 MED ORDER — SODIUM CHLORIDE 0.9% FLUSH
10.0000 mL | Freq: Two times a day (BID) | INTRAVENOUS | Status: DC
  Administered 2021-11-16: 10 mL

## 2021-11-16 MED ORDER — SODIUM CHLORIDE 0.9% FLUSH: 10.0000 mL | INTRAVENOUS | Status: DC | PRN

## 2021-11-16 MED ORDER — SODIUM CHLORIDE 0.9 % IV SOLN
8.0000 mg/kg | Freq: Every day | INTRAVENOUS | Status: DC
  Administered 2021-11-16 – 2021-11-17 (×2): 700 mg via INTRAVENOUS
  Filled 2021-11-16 (×3): qty 14

## 2021-11-16 MED ORDER — INSULIN GLARGINE-YFGN 100 UNIT/ML ~~LOC~~ SOLN
45.0000 [IU] | Freq: Every day | SUBCUTANEOUS | Status: DC
  Administered 2021-11-16 – 2021-11-17 (×2): 45 [IU] via SUBCUTANEOUS
  Filled 2021-11-16 (×2): qty 0.45

## 2021-11-16 MED ORDER — VANCOMYCIN HCL 1000 MG/200ML IV SOLN: 1000.0000 mg | INTRAVENOUS | Status: DC

## 2021-11-16 NOTE — Progress Notes (Signed)
Pharmacy Antibiotic Note  Ariel Arnold is a 65 y.o. female admitted on 11/09/2021 with osteomyelitis, s/p  left 1st metatarsal resection, wound excision and closure, and application of skin graft substitute on 11/12/2021. Pharmacy has been consulted for Vancomyin and Cefepime dosing.  VP=47, VTr=20  Plan: Change vancomycin to 1gm IV q24h (AUC 517.8, CMax 34.1, Cmin 12.5) Continue Cefepime 2g IV q8h Monitor renal function, cultures, clinical course, duration of therapy  Height: 5\' 8"  (172.7 cm) Weight: 86.5 kg (190 lb 11.2 oz) IBW/kg (Calculated) : 63.9  Temp (24hrs), Avg:98.2 F (36.8 C), Min:98 F (36.7 C), Max:98.4 F (36.9 C)  Recent Labs  Lab 11/09/21 2013 11/10/21 0832 11/11/21 0330 11/12/21 0302 11/13/21 0317 11/14/21 0328 11/15/21 0111 11/15/21 2046  WBC 1.5* 1.6* 1.8* 2.2* 2.2*  --   --   --   CREATININE 0.89 0.71 0.79 0.76 0.81 0.76  --   --   VANCOTROUGH  --   --   --   --   --   --   --  20  VANCOPEAK  --   --   --   --   --   --  47*  --      Estimated Creatinine Clearance: 78.5 mL/min (by C-G formula based on SCr of 0.76 mg/dL).    Allergies  Allergen Reactions   Chlorhexidine Rash   Tape Rash    Antimicrobials this admission: 11/22 Vancomycin >>  11/23 Cefepime >>  Dose adjustments this admission: 11/29 vanc 1gm IV q24h  Microbiology results: 11/25 1st metatarsal tissue: pending    Thank you for allowing pharmacy to be a part of this patient's care.   12/25 RPh 11/16/2021, 3:38 AM

## 2021-11-16 NOTE — Care Management Important Message (Signed)
Important Message  Patient Details IM Letter given to the Patient Name: Ariel Arnold MRN: [id number] Date of Birth: 20-Jul-1956   Medicare Important Message Given:  Yes     Caren Macadam 11/16/2021, 11:48 AM

## 2021-11-16 NOTE — Progress Notes (Signed)
Peripherally Inserted Central Catheter Placement  The IV Nurse has discussed with the patient and/or persons authorized to consent for the patient, the purpose of this procedure and the potential benefits and risks involved with this procedure.  The benefits include less needle sticks, lab draws from the catheter, and the patient may be discharged home with the catheter. Risks include, but not limited to, infection, bleeding, blood clot (thrombus formation), and puncture of an artery; nerve damage and irregular heartbeat and possibility to perform a PICC exchange if needed/ordered by physician.  Alternatives to this procedure were also discussed.  Bard Power PICC patient education guide, fact sheet on infection prevention and patient information card has been provided to patient /or left at bedside.    PICC Placement Documentation  PICC Single Lumen 11/16/21 Right Basilic 39 cm 0 cm (Active)  Indication for Insertion or Continuance of Line Home intravenous therapies (PICC only) 11/16/21 1559  Exposed Catheter (cm) 0 cm 11/16/21 1559  Site Assessment Clean;Dry;Intact 11/16/21 1559  Line Status Flushed;Saline locked;Blood return noted 11/16/21 1559  Dressing Type Transparent;Securing device 11/16/21 1559  Dressing Status Clean;Dry;Intact 11/16/21 1559  Antimicrobial disc in place? Yes 11/16/21 1559  Safety Lock Not Applicable 11/16/21 1559  Dressing Intervention Other (Comment) 11/16/21 1559  Dressing Change Due 11/23/21 11/16/21 1559       Annett Fabian 11/16/2021, 4:02 PM

## 2021-11-16 NOTE — Consult Note (Signed)
[facility] for Infectious Disease  Total days of antibiotics 8               Reason for Consult: left foot osteomyelitis with partial 1st MT resection   Referring Physician: [place]  Principal Problem:   Chronic osteomyelitis involving ankle and foot, left ([place]) Active Problems:   Liver cirrhosis secondary to NASH ([place])   Depressive disorder   Uncontrolled type 2 diabetes mellitus with hyperglycemia ([place])   Essential hypertension   Pancytopenia (HCC)    HPI: Ariel Arnold is a 65 y.o. female  with history of T2DM, PVD, cirrhosis 2/2 NASH with leukopenia and thrombocytopenia, chronic left foot osteomyelitis/DFU. She was admitted on 11/22 for worsening drainage, and redness to her left foot. She was recently treated with 3 wks of iv vancomy followed by oral ciprofloxacin for which her symptoms worsened thereafter. She has had previous amputation of all other toes, of left foot. She also reported blood sugars higher than normal. She was admitted per her podiatrist for iv abtx and evaluation for surgery. She does not recall any recent evaluation for vascular studies. MRI of left foot showed cortical destruction of distal first metatarsal and abn enhancement of whole first metatarsal that was c/w acute osteo. On 11/25, she was taken to the or for 1st MT resection, but still had residual infected bone remaining. Bone cx showed MRSA(Tetra S and Bactrim S). She has been on vancomycin and cefepime since admission. ID asked to weigh in on abtx.  Past Medical History:  Diagnosis Date   Anemia    Anxiety    Aortic atherosclerosis ([place])    Arthritis    AVM (arteriovenous malformation) of colon    Depression    Essential hypertension    GI bleeding    Recurrent   Hepatic encephalopathy    History of kidney stones    History of RSV infection    Liver cirrhosis secondary to NASH ([place]) 2004   Biopsy-proven   Obesity    Osteomyelitis (HCC)    Peripheral vascular disease (HCC)     Thrombocytopenia (HCC)    Type 2 diabetes mellitus (HCC)     Allergies:  Allergies  Allergen Reactions   Chlorhexidine Rash   Tape Rash    Current antibiotics:   MEDICATIONS:  carvedilol  6.25 mg Oral BID   ferrous sulfate  325 mg Oral Q breakfast   folic acid  1 mg Oral BID   gabapentin  600 mg Oral TID   insulin aspart  0-15 Units Subcutaneous TID WC   insulin aspart  0-5 Units Subcutaneous QHS   insulin glargine-yfgn  45 Units Subcutaneous Daily   sertraline  150 mg Oral Daily    Social History   Tobacco Use   Smoking status: Former    Types: Cigarettes   Smokeless tobacco: Never  Vaping Use   Vaping Use: Never used  Substance Use Topics   Alcohol use: Yes    Comment: wine rare   Drug use: Never    Family History  Problem Relation Age of Onset   Heart failure Mother    Hyperlipidemia Mother    Diabetes Mellitus II Mother    Heart failure Father    Hyperlipidemia Father     Review of Systems -   Constitutional: Negative for fever, chills, diaphoresis, activity change, appetite change, fatigue and unexpected weight change.  HENT: Negative for congestion, sore throat, rhinorrhea, sneezing, trouble swallowing and sinus pressure.  Eyes: Negative  for photophobia and visual disturbance.  Respiratory: Negative for cough, chest tightness, shortness of breath, wheezing and stridor.  Cardiovascular: Negative for chest pain, palpitations and leg swelling.  Gastrointestinal: Negative for nausea, vomiting, abdominal pain, diarrhea, constipation, blood in stool, abdominal distention and anal bleeding.  Genitourinary: Negative for dysuria, hematuria, flank pain and difficulty urinating.  Musculoskeletal: Negative for myalgias, back pain, joint swelling, arthralgias and gait problem.  Skin: Negative for color change, pallor, rash and wound.  Neurological: Negative for dizziness, tremors, weakness and light-headedness.  Hematological: Negative for adenopathy. Does not  bruise/bleed easily.  Psychiatric/Behavioral: Negative for behavioral problems, confusion, sleep disturbance, dysphoric mood, decreased concentration and agitation.     OBJECTIVE: Temp:  [98 F (36.7 C)-98.2 F (36.8 C)] 98 F (36.7 C) (11/29 1433) Pulse Rate:  [62-70] 70 (11/29 1433) Resp:  [16-18] 16 (11/29 1433) BP: (108-127)/(47-59) 108/57 (11/29 1433) SpO2:  [96 %-100 %] 96 % (11/29 1433)  Constitutional:  oriented to person, place, and time. appears well-developed and well-nourished. No distress.  HENT: [place]/AT, PERRLA, no scleral icterus Mouth/Throat: Oropharynx is clear and moist. No oropharyngeal exudate.  Cardiovascular: Normal rate, regular rhythm and normal heart sounds. Exam reveals no gallop and no friction rub.  No murmur heard.  Pulmonary/Chest: Effort normal and breath sounds normal. No respiratory distress.  has no wheezes.  Neck = supple, no nuchal rigidity Abdominal: Soft. Bowel sounds are normal.  exhibits no distension. There is no tenderness.  Lymphadenopathy: no cervical adenopathy. No axillary adenopathy Neurological: alert and oriented to person, place, and time.  Ext: left foot wrapped. Right foot 2 partial digital amputation Skin: Skin is warm and dry. No rash noted. No erythema.  Psychiatric: a normal mood and affect.  behavior is normal.    LABS: Results for orders placed or performed during the hospital encounter of 11/09/21 (from the past 48 hour(s))  Glucose, capillary     Status: Abnormal   Collection Time: 11/14/21  4:50 PM  Result Value Ref Range   Glucose-Capillary 258 (H) 70 - 99 mg/dL    Comment: Glucose reference range applies only to samples taken after fasting for at least 8 hours.   Comment 1 Notify RN    Comment 2 Document in Chart   Glucose, capillary     Status: Abnormal   Collection Time: 11/14/21  9:24 PM  Result Value Ref Range   Glucose-Capillary 156 (H) 70 - 99 mg/dL    Comment: Glucose reference range applies only to samples  taken after fasting for at least 8 hours.  Vancomycin, peak     Status: Abnormal   Collection Time: 11/15/21  1:11 AM  Result Value Ref Range   Vancomycin Pk 47 (H) 30 - 40 ug/mL    Comment: Performed at [facility], [address] [place], [place] [address]  Glucose, capillary     Status: Abnormal   Collection Time: 11/15/21  7:28 AM  Result Value Ref Range   Glucose-Capillary 174 (H) 70 - 99 mg/dL    Comment: Glucose reference range applies only to samples taken after fasting for at least 8 hours.  Glucose, capillary     Status: Abnormal   Collection Time: 11/15/21 11:57 AM  Result Value Ref Range   Glucose-Capillary 153 (H) 70 - 99 mg/dL    Comment: Glucose reference range applies only to samples taken after fasting for at least 8 hours.  MRSA Next Gen by PCR, Nasal     Status: None  Collection Time: 11/15/21 12:04 PM   Specimen: Nasal Mucosa; Nasal Swab  Result Value Ref Range   MRSA by PCR Next Gen NOT DETECTED NOT DETECTED    Comment: (NOTE) The GeneXpert MRSA Assay (FDA approved for NASAL specimens only), is one component of a comprehensive MRSA colonization surveillance program. It is not intended to diagnose MRSA infection nor to guide or monitor treatment for MRSA infections. Test performance is not FDA approved in patients less than 56 years old. Performed at [facility], [address] [place], [place] [address]   Glucose, capillary     Status: Abnormal   Collection Time: 11/15/21  4:07 PM  Result Value Ref Range   Glucose-Capillary 208 (H) 70 - 99 mg/dL    Comment: Glucose reference range applies only to samples taken after fasting for at least 8 hours.  Vancomycin, trough     Status: None   Collection Time: 11/15/21  8:46 PM  Result Value Ref Range   Vancomycin Tr 20 15 - 20 ug/mL    Comment: Performed at [facility], [address] [place], [place] [address]  Glucose, capillary     Status: Abnormal    Collection Time: 11/15/21  9:59 PM  Result Value Ref Range   Glucose-Capillary 142 (H) 70 - 99 mg/dL    Comment: Glucose reference range applies only to samples taken after fasting for at least 8 hours.  CBC     Status: Abnormal   Collection Time: 11/16/21  3:39 AM  Result Value Ref Range   WBC 1.9 (L) 4.0 - 10.5 K/uL   RBC 2.52 (L) 3.87 - 5.11 MIL/uL   Hemoglobin 7.3 (L) 12.0 - 15.0 g/dL   HCT 76.2 (L) 83.1 - 51.7 %   MCV 95.2 80.0 - 100.0 fL   MCH 29.0 26.0 - 34.0 pg   MCHC 30.4 30.0 - 36.0 g/dL   RDW 61.6 07.3 - 71.0 %   Platelets 60 (L) 150 - 400 K/uL    Comment: SPECIMEN CHECKED FOR CLOTS Immature Platelet Fraction may be clinically indicated, consider ordering this additional test [id number] CONSISTENT WITH PREVIOUS RESULT REPEATED TO VERIFY    nRBC 0.0 0.0 - 0.2 %    Comment: Performed at [facility], [address], [place], [place] [address]  Basic metabolic panel     Status: Abnormal   Collection Time: 11/16/21  3:39 AM  Result Value Ref Range   Sodium 135 135 - 145 mmol/L   Potassium 4.8 3.5 - 5.1 mmol/L   Chloride 111 98 - 111 mmol/L   CO2 20 (L) 22 - 32 mmol/L   Glucose, Bld 181 (H) 70 - 99 mg/dL    Comment: Glucose reference range applies only to samples taken after fasting for at least 8 hours.   BUN 27 (H) 8 - 23 mg/dL   Creatinine, Ser 5.00 0.44 - 1.00 mg/dL   Calcium 8.6 (L) 8.9 - 10.3 mg/dL   GFR, Estimated >93 >81 mL/min    Comment: (NOTE) Calculated using the CKD-EPI Creatinine Equation (2021)    Anion gap 4 (L) 5 - 15    Comment: Performed at [facility], [address], [place], [place] [address]  Glucose, capillary     Status: Abnormal   Collection Time: 11/16/21  7:32 AM  Result Value Ref Range   Glucose-Capillary 139 (H) 70 - 99 mg/dL    Comment: Glucose reference range applies only to samples taken after  fasting for at least 8 hours.  Glucose, capillary     Status: Abnormal   Collection Time:  11/16/21 12:10 PM  Result Value Ref Range   Glucose-Capillary 186 (H) 70 - 99 mg/dL    Comment: Glucose reference range applies only to samples taken after fasting for at least 8 hours.    MICRO: Methicillin resistant staphylococcus aureus      MIC    CIPROFLOXACIN 2 INTERMEDI... Intermediate    CLINDAMYCIN <=0.25 SENS... Sensitive    ERYTHROMYCIN >=8 RESISTANT  Resistant    GENTAMICIN <=0.5 SENSI... Sensitive    Inducible Clindamycin NEGATIVE  Sensitive    OXACILLIN >=4 RESISTANT  Resistant    RIFAMPIN <=0.5 SENSI... Sensitive    TETRACYCLINE <=1 SENSITIVE  Sensitive    TRIMETH/SULFA <=10 SENSIT... Sensitive    VANCOMYCIN 1 SENSITIVE  Sensitive    Lab Results  Component Value Date   ESRSEDRATE 21 11/09/2021   Lab Results  Component Value Date   CRP 0.9 11/09/2021    IMAGING: [place] EKG SITE RITE  Result Date: 11/16/2021 If Site Rite image not attached, placement could not be confirmed due to current cardiac rhythm.   Assessment/Plan:  65yo F with diabetic foot ulcer/osteomyelitis of left 1st MT, s/p partial resection with probable residual infected bone remaining. Recommend to treat for MRSA osteomyelitis.   Will do 6 wk of IV daptomycin at 8mg /kg/day, will get baseline CK Will order picc line and have home health discuss management  Leukopenia,thrombocytopenia =slightly lower than her baseline. Unclear if related to vancomycin. Will discontinue and see if any changes in her cell lines  Hx of PVD= unsure if she has had recent ABI to consider repeat imaging in order to see if candidate for revascularizatoin  Will see the patient back in 3-4 wk to discuss if need to do 4 vs 6 wk of IV daptomycin followed by oral therapy.

## 2021-11-16 NOTE — Plan of Care (Signed)

## 2021-11-16 NOTE — Progress Notes (Signed)
PROGRESS NOTE    SHRUTI NEEB  [id number] DOB: 1956-02-17 DOA: 11/09/2021 PCP: Jacqualine Code, DO    Brief Narrative:  Ariel Arnold is a 65 year old female with past medical history significant for type 2 diabetes mellitus, recurrent osteomyelitis of left foot s/p amputation with ongoing wound, essential hypertension, NASH cirrhosis, peripheral vascular disease, history of AVMs with prior GI bleeding, pancytopenia, depression who presents to [facility] ED for worsening left foot swelling, redness and drainage.  Patient was seen by her podiatrist earlier in the day with recommendations of ED evaluation for hospital admission.  Patient has been on multiple rounds of antibiotics for osteomyelitis and finished IV vancomycin about 2-3 weeks ago and continued on oral ciprofloxacin until last week.  She denies any fevers or chills, no shortness of breath, no nausea/vomiting/diarrhea.    Patient also reports she ran out of her insulin over a week ago and is waiting for her insurance to approve her insulin at the pharmacy for her to pick up.  In the ED, temperature 97.7 F, HR 86, RR 16, BP 149/63, SPO2 100% on room air.  Sodium 134, potassium 3.9, chloride 101, CO2 24, glucose 65, anion gap 9, BUN 19, creatinine 0.89.  AST 86, ALT 59.  WBC 1.5, hemoglobin 8.5, platelets 57.  CRP 0.9.  Cova-19 PCR negative.  Influenza A/B PCR negative.  Left foot x-ray with soft tissue ulcer along the medial distal stump, lucency/irregularity of the residual distal first metatarsal, diffuse soft tissue swelling of the foot.  Podiatry was consulted.  Hospital service consulted for further evaluation and management of acute diabetic foot infection with likely concern of acute osteomyelitis.   Assessment & Plan:   Principal Problem:   Chronic osteomyelitis involving ankle and foot, left (HCC) Active Problems:   Liver cirrhosis secondary to NASH (HCC)   Depressive disorder   Uncontrolled type 2 diabetes mellitus  with hyperglycemia (HCC)   Essential hypertension   Pancytopenia (HCC)   Acute on chronic osteomyelitis left foot 2/2 MRSA Diabetic foot infection Patient presenting to ED by direction of her podiatrist for worsening left foot wound concerning for acute on chronic osteomyelitis.  Patient completed outpatient course of IV vancomycin followed by oral ciprofloxacin without improvement.  X-ray left foot with soft tissue ulcer medial distal stump with lucency irregularity of the residual distal first metatarsal and diffuse soft tissue swelling of the foot.  MRI left foot with and without contrast with soft tissue ulceration distal medial foot stump with tract extending to the distal first metatarsal and surrounding cellulitis, acute osteomyelitis of the first metatarsal.  Podiatry was consulted and patient underwent left first metatarsal resection with wound excision/closure and skin graft by Dr. March Rummage on 11/12/2021. --Vancomycin, pharmacy consulted for dosing/monitoring --Operative culture: MRSA; susceptibilities pending --Dressing changes per podiatry --Heel weightbearing LLE with cam walker boot --Likely need 6 weeks IV antibiotics pending culture results; ID consulted for assistance with antibiotic regimen  Type 2 diabetes mellitus, with hyperglycemia Hemoglobin A1c 9.4, poorly controlled.  Patient reports has been out of her insulin; at baseline on Toujeo 65 units every qAM/qPM, metformin 500 mg p.o. twice daily. --Diabetic educator following, appreciate assistance --Semglee 45 units subcutaneously daily --Moderate SSI for coverage --CBGs qAC/HS  Hyperkalemia: Resolved Potassium 5.3 on 11/26.  S/p IV fluid hydration. --K 5.3>4.3 --Continue to hold spironolactone --BMP in a.m.  Hyponatremia Sodium 130 this morning, likely secondary to viral depletion from prolonged n.p.o. status from surgery. --Na 130>133 --IV fluid hydration  Essential hypertension --Carvedilol 6.25 mg p.o. twice  daily --Holding spironolactone as above for hyperkalemia. --Hydralazine 25 mg p.o. q6h PRN SBP >170 or DBP >110  Hx GI bleed/AVM Iron deficiency anemia --Hgb 8.5>7.6>7.8>8.2>7.9 --Ferrous sulfate 325 mg p.o. daily --Repeat CBC in a.m.  Depression/anxiety: Sertraline 150 mg p.o. daily  Neuropathy: Gabapentin 600 mg p.o. TID  HLD: Pravastatin 20 mg p.o. nightly  Nash cirrhosis On Spironolactone 50 mg p.o. every morning at home; discontinued as above     DVT prophylaxis: SCDs Start: 11/10/21 0806   Code Status: Full Code Family Communication: No family present at bedside this morning  Disposition Plan:  Level of care: Med-Surg Status is: Inpatient  Remains inpatient appropriate because: Awaiting operative cultures for anticipated prolonged home IV antibiotic therapy.    Consultants:  Podiatry, Dr. March Rummage Infectious disease, Dr. Baxter Flattery  Procedures:  Left first metatarsal resection, wound excision and closure with skin graft; Dr. March Rummage 11/25  Antimicrobials:  Vancomycin 11/22>> Cefepime 11/23 - 11/29    Subjective: Patient seen examined bedside, resting comfortably.  Lying in bed.  Pain controlled.  Asking about obtaining lifestyle libre glucose monitoring device.  Continues on IV vancomycin, pending operative culture susceptibilities.  Consulted ID today for assistance with antibiotic regimen on discharge; podiatry recommending 6-week course.  Denies headache, no fever/chills/night sweats, no nausea/vomiting/diarrhea, no chest pain, no palpitations, no shortness of breath, no abdominal pain, no weakness, no fatigue, no paresthesias.  No acute events overnight per nursing staff.  Objective: Vitals:   11/15/21 1409 11/15/21 2202 11/16/21 0447 11/16/21 0830  BP: 108/68 (!) 111/47 (!) 117/54 (!) 127/59  Pulse: 72 69 65 62  Resp: 18 18 18 17   Temp: 98.4 F (36.9 C) 98.2 F (36.8 C) 98 F (36.7 C) 98 F (36.7 C)  TempSrc: Oral Oral Oral Oral  SpO2: 98% 99% 98% 100%   Weight:      Height:        Intake/Output Summary (Last 24 hours) at 11/16/2021 1038 Last data filed at 11/16/2021 0600 Gross per 24 hour  Intake 1399.14 ml  Output --  Net 1399.14 ml   Filed Weights   11/09/21 1945 11/10/21 0046  Weight: 86.2 kg 86.5 kg    Examination:  General exam: Appears calm and comfortable, appears older than stated age Respiratory system: Clear to auscultation. Respiratory effort normal.  On room air Cardiovascular system: S1 & S2 heard, RRR. No JVD, murmurs, rubs, gallops or clicks. No pedal edema. Gastrointestinal system: Abdomen is nondistended, soft and nontender. No organomegaly or masses felt. Normal bowel sounds heard. Central nervous system: Alert and oriented. No focal neurological deficits. Extremities: Symmetric 5 x 5 power. Skin: Left foot with dressing/Ace wrap in place, clean/dry/intact, otherwise no rashes, lesions or ulcers Psychiatry: Judgement and insight appear normal. Mood & affect appropriate.     Data Reviewed: I have personally reviewed following labs and imaging studies  CBC: Recent Labs  Lab 11/09/21 2013 11/10/21 0832 11/11/21 0330 11/12/21 0302 11/13/21 0317 11/16/21 0339  WBC 1.5* 1.6* 1.8* 2.2* 2.2* 1.9*  NEUTROABS 0.8*  --   --   --   --   --   HGB 8.5* 7.6* 7.8* 8.2* 7.9* 7.3*  HCT 27.2* 24.3* 24.9* 26.4* 26.0* 24.0*  MCV 93.8 93.5 94.0 93.6 95.2 95.2  PLT 57* 53* 57* 63* 56* 60*   Basic Metabolic Panel: Recent Labs  Lab 11/11/21 0330 11/12/21 0302 11/13/21 0317 11/13/21 1121 11/14/21 0328 11/16/21 0339  NA 136 135 130*  --  133* 135  K 3.8 4.8 5.3* 4.4 4.3 4.8  CL 106 107 103  --  106 111  CO2 22 23 21*  --  21* 20*  GLUCOSE 288* 119* 260*  --  178* 181*  BUN 21 23 24*  --  23 27*  CREATININE 0.79 0.76 0.81  --  0.76 0.82  CALCIUM 8.3* 8.6* 8.5*  --  8.5* 8.6*  MG  --   --  1.6*  --   --   --    GFR: Estimated Creatinine Clearance: 76.6 mL/min (by C-G formula based on SCr of 0.82  mg/dL). Liver Function Tests: Recent Labs  Lab 11/09/21 2013  AST 86*  ALT 59*  ALKPHOS 124  BILITOT 0.9  PROT 7.0  ALBUMIN 3.3*   No results for input(s): LIPASE, AMYLASE in the last 168 hours. No results for input(s): AMMONIA in the last 168 hours. Coagulation Profile: No results for input(s): INR, PROTIME in the last 168 hours. Cardiac Enzymes: No results for input(s): CKTOTAL, CKMB, CKMBINDEX, TROPONINI in the last 168 hours. BNP (last 3 results) No results for input(s): PROBNP in the last 8760 hours. HbA1C: No results for input(s): HGBA1C in the last 72 hours.  CBG: Recent Labs  Lab 11/15/21 0728 11/15/21 1157 11/15/21 1607 11/15/21 2159 11/16/21 0732  GLUCAP 174* 153* 208* 142* 139*   Lipid Profile: No results for input(s): CHOL, HDL, LDLCALC, TRIG, CHOLHDL, LDLDIRECT in the last 72 hours. Thyroid Function Tests: No results for input(s): TSH, T4TOTAL, FREET4, T3FREE, THYROIDAB in the last 72 hours. Anemia Panel: No results for input(s): VITAMINB12, FOLATE, FERRITIN, TIBC, IRON, RETICCTPCT in the last 72 hours. Sepsis Labs: No results for input(s): PROCALCITON, LATICACIDVEN in the last 168 hours.  Recent Results (from the past 240 hour(s))  Resp Panel by RT-PCR (Flu A&B, Covid) Nasopharyngeal Swab     Status: None   Collection Time: 11/09/21  9:19 PM   Specimen: Nasopharyngeal Swab; Nasopharyngeal(NP) swabs in vial transport medium  Result Value Ref Range Status   SARS Coronavirus 2 by RT PCR NEGATIVE NEGATIVE Final    Comment: (NOTE) SARS-CoV-2 target nucleic acids are NOT DETECTED.  The SARS-CoV-2 RNA is generally detectable in upper respiratory specimens during the acute phase of infection. The lowest concentration of SARS-CoV-2 viral copies this assay can detect is 138 copies/mL. A negative result does not preclude SARS-Cov-2 infection and should not be used as the sole basis for treatment or other patient management decisions. A negative result may  occur with  improper specimen collection/handling, submission of specimen other than nasopharyngeal swab, presence of viral mutation(s) within the areas targeted by this assay, and inadequate number of viral copies(<138 copies/mL). A negative result must be combined with clinical observations, patient history, and epidemiological information. The expected result is Negative.  Fact Sheet for Patients:  BloggerCourse.com  Fact Sheet for Healthcare Providers:  SeriousBroker.it  This test is no t yet approved or cleared by the [place] FDA and  has been authorized for detection and/or diagnosis of SARS-CoV-2 by FDA under an Emergency Use Authorization (EUA). This EUA will remain  in effect (meaning this test can be used) for the duration of the COVID-19 declaration under Section 564(b)(1) of the Act, 21 U.S.C.section 360bbb-3(b)(1), unless the authorization is terminated  or revoked sooner.       Influenza A by PCR NEGATIVE NEGATIVE Final   Influenza B by PCR NEGATIVE NEGATIVE Final    Comment: (NOTE) The Xpert Xpress SARS-CoV-2/FLU/RSV plus assay  is intended as an aid in the diagnosis of influenza from Nasopharyngeal swab specimens and should not be used as a sole basis for treatment. Nasal washings and aspirates are unacceptable for Xpert Xpress SARS-CoV-2/FLU/RSV testing.  Fact Sheet for Patients: EntrepreneurPulse.com.au  Fact Sheet for Healthcare Providers: IncredibleEmployment.be  This test is not yet approved or cleared by the [place] FDA and has been authorized for detection and/or diagnosis of SARS-CoV-2 by FDA under an Emergency Use Authorization (EUA). This EUA will remain in effect (meaning this test can be used) for the duration of the COVID-19 declaration under Section 564(b)(1) of the Act, 21 U.S.C. section 360bbb-3(b)(1), unless the authorization is terminated  or revoked.  Performed at [facility], [place] [address] [place], [place] [address]   Aerobic/Anaerobic Culture w Gram Stain (surgical/deep wound)     Status: None (Preliminary result)   Collection Time: 11/12/21  1:39 PM   Specimen: PATH Soft tissue  Result Value Ref Range Status   Specimen Description WOUND  Final   Special Requests 1ST METATARSAL  Final   Gram Stain   Final    RARE WBC PRESENT, PREDOMINANTLY MONONUCLEAR NO ORGANISMS SEEN Performed at [facility], [place] [address] [place], [place] [address]    Culture   Final    RARE METHICILLIN RESISTANT STAPHYLOCOCCUS AUREUS WITHIN MIXED ORGANISMS NO ANAEROBES ISOLATED; CULTURE IN PROGRESS FOR 5 DAYS    Report Status PENDING  Incomplete   Organism ID, Bacteria METHICILLIN RESISTANT STAPHYLOCOCCUS AUREUS  Final      Susceptibility   Methicillin resistant staphylococcus aureus - MIC*    CIPROFLOXACIN 2 INTERMEDIATE Intermediate     ERYTHROMYCIN >=8 RESISTANT Resistant     GENTAMICIN <=0.5 SENSITIVE Sensitive     OXACILLIN >=4 RESISTANT Resistant     TETRACYCLINE <=1 SENSITIVE Sensitive     VANCOMYCIN 1 SENSITIVE Sensitive     TRIMETH/SULFA <=10 SENSITIVE Sensitive     CLINDAMYCIN <=0.25 SENSITIVE Sensitive     RIFAMPIN <=0.5 SENSITIVE Sensitive     Inducible Clindamycin NEGATIVE Sensitive     * RARE METHICILLIN RESISTANT STAPHYLOCOCCUS AUREUS  MRSA Next Gen by PCR, Nasal     Status: None   Collection Time: 11/15/21 12:04 PM   Specimen: Nasal Mucosa; Nasal Swab  Result Value Ref Range Status   MRSA by PCR Next Gen NOT DETECTED NOT DETECTED Final    Comment: (NOTE) The GeneXpert MRSA Assay (FDA approved for NASAL specimens only), is one component of a comprehensive MRSA colonization surveillance program. It is not intended to diagnose MRSA infection nor to guide or monitor treatment for MRSA infections. Test performance is not FDA approved in patients less than 60 years old. Performed at [facility], [place] [address] [place], [place] [address]          Radiology Studies: No results found.      Scheduled Meds:  carvedilol  6.25 mg Oral BID   ferrous sulfate  325 mg Oral Q breakfast   folic acid  1 mg Oral BID   gabapentin  600 mg Oral TID   insulin aspart  0-15 Units Subcutaneous TID WC   insulin aspart  0-5 Units Subcutaneous QHS   insulin glargine-yfgn  45 Units Subcutaneous Daily   sertraline  150 mg Oral Daily   Continuous Infusions:  sodium chloride 10 mL/hr at 11/15/21 2219   vancomycin       LOS: 7 days    Time spent: 35 minutes  spent on chart review, discussion with nursing staff, consultants, updating family and interview/physical exam; more than 50% of that time was spent in counseling and/or coordination of care.    Heriberto Stmartin J [place] DO [facility] Available via Epic secure chat 7am-7pm After these hours, please refer to coverage provider listed on [email or web address] 11/16/2021, 10:38 AM

## 2021-11-16 NOTE — Progress Notes (Signed)
PHARMACY CONSULT NOTE FOR:  OUTPATIENT  PARENTERAL ANTIBIOTIC THERAPY (OPAT)  Indication: Foot osteomyelitis Regimen: daptomycin 700mg  IV q24h End date: 12-24-21  IV antibiotic discharge orders are pended. To discharging provider:  please sign these orders via discharge navigator,  Select New Orders & click on the button choice - Manage This Unsigned Work.     Thank you for allowing pharmacy to be a part of this patient's care.  08-18-1997 11/16/2021, 2:37 PM

## 2021-11-17 DIAGNOSIS — I10 Essential (primary) hypertension: Secondary | ICD-10-CM | POA: Diagnosis not present

## 2021-11-17 DIAGNOSIS — F32A Depression, unspecified: Secondary | ICD-10-CM

## 2021-11-17 DIAGNOSIS — E1165 Type 2 diabetes mellitus with hyperglycemia: Secondary | ICD-10-CM

## 2021-11-17 DIAGNOSIS — M86672 Other chronic osteomyelitis, left ankle and foot: Secondary | ICD-10-CM | POA: Diagnosis not present

## 2021-11-17 DIAGNOSIS — K7581 Nonalcoholic steatohepatitis (NASH): Secondary | ICD-10-CM | POA: Diagnosis not present

## 2021-11-17 DIAGNOSIS — D61818 Other pancytopenia: Secondary | ICD-10-CM

## 2021-11-17 DIAGNOSIS — K7460 Unspecified cirrhosis of liver: Secondary | ICD-10-CM

## 2021-11-17 LAB — CBC
HCT: 23.5 % — ABNORMAL LOW (ref 36.0–46.0)
Hemoglobin: 7.3 g/dL — ABNORMAL LOW (ref 12.0–15.0)
MCH: 29 pg (ref 26.0–34.0)
MCHC: 31.1 g/dL (ref 30.0–36.0)
MCV: 93.3 fL (ref 80.0–100.0)
Platelets: 65 10*3/uL — ABNORMAL LOW (ref 150–400)
RBC: 2.52 MIL/uL — ABNORMAL LOW (ref 3.87–5.11)
RDW: 14.5 % (ref 11.5–15.5)
WBC: 2.1 10*3/uL — ABNORMAL LOW (ref 4.0–10.5)
nRBC: 0 % (ref 0.0–0.2)

## 2021-11-17 LAB — BASIC METABOLIC PANEL
Anion gap: 3 — ABNORMAL LOW (ref 5–15)
BUN: 31 mg/dL — ABNORMAL HIGH (ref 8–23)
CO2: 22 mmol/L (ref 22–32)
Calcium: 8.5 mg/dL — ABNORMAL LOW (ref 8.9–10.3)
Chloride: 110 mmol/L (ref 98–111)
Creatinine, Ser: 0.69 mg/dL (ref 0.44–1.00)
GFR, Estimated: >60 mL/min (ref >60)
Glucose, Bld: 113 mg/dL — ABNORMAL HIGH (ref 70–99)
Potassium: 4.5 mmol/L (ref 3.5–5.1)
Sodium: 135 mmol/L (ref 135–145)

## 2021-11-17 LAB — AEROBIC/ANAEROBIC CULTURE W GRAM STAIN (SURGICAL/DEEP WOUND)

## 2021-11-17 LAB — GLUCOSE, CAPILLARY
Glucose-Capillary: 90 mg/dL (ref 70–99)
Glucose-Capillary: 211 mg/dL — ABNORMAL HIGH (ref 70–99)
Glucose-Capillary: 245 mg/dL — ABNORMAL HIGH (ref 70–99)

## 2021-11-17 LAB — CK: Total CK: 24 U/L — ABNORMAL LOW (ref 38–234)

## 2021-11-17 MED ORDER — DAPTOMYCIN IV (FOR PTA / DISCHARGE USE ONLY): 700.0000 mg | INTRAVENOUS | 0 refills | Status: AC

## 2021-11-17 MED ORDER — PRAVASTATIN SODIUM 20 MG PO TABS: 20.0000 mg | ORAL_TABLET | Freq: Every day | ORAL | Status: AC

## 2021-11-17 MED ORDER — HEPARIN SOD (PORK) LOCK FLUSH 100 UNIT/ML IV SOLN
250.0000 [IU] | INTRAVENOUS | Status: AC | PRN
  Administered 2021-11-17: 250 [IU]
  Filled 2021-11-17: qty 2.5

## 2021-11-17 NOTE — Progress Notes (Signed)
[facility]    Date of Admission:  11/09/2021   Total days of antibiotics 9/day 2          ID: Ariel Arnold is a 65 y.o. female with   Principal Problem:   Chronic osteomyelitis involving ankle and foot, left (HCC) Active Problems:   Liver cirrhosis secondary to NASH (HCC)   Depressive disorder   Uncontrolled type 2 diabetes mellitus with hyperglycemia (HCC)   Essential hypertension   Pancytopenia (HCC)    Subjective: Afebrile. Less foot pain. Had picc line placed without difficulty   Medications:   carvedilol  6.25 mg Oral BID   ferrous sulfate  325 mg Oral Q breakfast   folic acid  1 mg Oral BID   gabapentin  600 mg Oral TID   insulin aspart  0-15 Units Subcutaneous TID WC   insulin aspart  0-5 Units Subcutaneous QHS   insulin glargine-yfgn  45 Units Subcutaneous Daily   sertraline  150 mg Oral Daily   sodium chloride flush  10-40 mL Intracatheter Q12H    Objective: Vital signs in last 24 hours: Temp:  [98.2 F (36.8 C)-98.8 F (37.1 C)] 98.7 F (37.1 C) (11/30 1312) Pulse Rate:  [66-69] 66 (11/30 1312) Resp:  [17-18] 18 (11/30 1312) BP: (100-123)/(41-61) 109/61 (11/30 1312) SpO2:  [96 %-100 %] 96 % (11/30 1312) Physical Exam  Constitutional:  oriented to person, place, and time. appears well-developed and well-nourished. No distress.  HENT: [place]/AT, PERRLA, no scleral icterus Mouth/Throat: Oropharynx is clear and moist. No oropharyngeal exudate.  Cardiovascular: Normal rate, regular rhythm and normal heart sounds. Exam reveals no gallop and no friction rub.  No murmur heard.  Pulmonary/Chest: Effort normal and breath sounds normal. No respiratory distress.  has no wheezes.  Neck = supple, no nuchal rigidity Abdominal: Soft. Bowel sounds are normal.  exhibits no distension. There is no tenderness. Ext: left foot wrapped  Lymphadenopathy: no cervical adenopathy. No axillary adenopathy Neurological: alert and oriented to person,  place, and time.  Skin: Skin is warm and dry. No rash noted. No erythema.  Psychiatric: a normal mood and affect.  behavior is normal.    Lab Results Recent Labs    11/16/21 0339 11/17/21 0511  WBC 1.9* 2.1*  HGB 7.3* 7.3*  HCT 24.0* 23.5*  NA 135 135  K 4.8 4.5  CL 111 110  CO2 20* 22  BUN 27* 31*  CREATININE 0.82 0.69   Lab Results  Component Value Date   ESRSEDRATE 21 11/09/2021   Lab Results  Component Value Date   CRP 0.9 11/09/2021    Microbiology: METHICILLIN RESISTANT STAPHYLOCOCCUS AUREUS    Resulting Agency [facility]     Susceptibility   Methicillin resistant staphylococcus aureus    MIC    CIPROFLOXACIN 2 INTERMEDI... Intermediate    CLINDAMYCIN <=0.25 SENS... Sensitive    ERYTHROMYCIN >=8 RESISTANT  Resistant    GENTAMICIN <=0.5 SENSI... Sensitive    Inducible Clindamycin NEGATIVE  Sensitive    OXACILLIN >=4 RESISTANT  Resistant    RIFAMPIN <=0.5 SENSI... Sensitive    TETRACYCLINE <=1 SENSITIVE  Sensitive    TRIMETH/SULFA <=10 SENSIT... Sensitive    VANCOMYCIN 1 SENSITIVE  Sensitive          Studies/Results: [place] EKG SITE RITE  Result Date: 11/16/2021 If Site Rite image not attached, placement could not be confirmed due to current cardiac rhythm.    Assessment/Plan: MRSA left 1st MT osteomyelitis =  plan with daptomycin 700mg  daily, will do 6 wk through jan 6th 2023.  Will see back in 3-4 wk.  [facility] Pager: [phone]  11/17/2021, 5:32 PM

## 2021-11-17 NOTE — Discharge Summary (Signed)
Physician Discharge Summary  Ariel Arnold:[id number] DOB: 1956/01/19 DOA: 11/09/2021  PCP: Jacqualine Code, DO  Admit date: 11/09/2021 Discharge date: 11/17/2021  Admitted From: Home  Discharge disposition: Home with home health  Recommendations for Outpatient Follow-Up:   Follow up with your primary care provider in one week.  Check CBC, CMP, magnesium in the next visit.  Follow labs while on daptomycin Follow-up with orthopedics Dr. March Rummage in 1 week. Follow-up with infectious disease clinic as scheduled by ID. Please resume pravastatin after daptomycin has been completed.  Discharge Diagnosis:   Principal Problem:   Chronic osteomyelitis involving ankle and foot, left (HCC) Active Problems:   Liver cirrhosis secondary to NASH (HCC)   Depressive disorder   Uncontrolled type 2 diabetes mellitus with hyperglycemia ([place])   Essential hypertension   Pancytopenia ([place])  Discharge Condition: Improved.  Diet recommendation: Low sodium, heart healthy.  Carbohydrate-modified.    Wound care: None.  Code status: Full.  History of Present Illness:   Ariel Arnold is a 65 year old female with past medical history significant for type 2 diabetes mellitus, recurrent osteomyelitis of left foot s/p amputation with ongoing wound, essential hypertension, NASH cirrhosis, peripheral vascular disease, history of AVMs with prior GI bleeding, pancytopenia, depression presented to hospital with worsening left foot swelling redness and drainage.  Patient had seen her podiatrist earlier in the day who recommended admission to hospital for IV antibiotic.  Patient had received multiple rounds of antibiotic in the past and had recently completed IV vancomycin 2 to 3 weeks prior to presentation.  In the ED, patient was afebrile.  ALT and AST was mildly elevated.  Patient had mild anemia and thrombocytopenia.  Influenza and COVID was negative.  Left foot x-ray showed soft tissue ulcer along  the medial distal stump with irregularity of the residual distal first metatarsal.  Podiatry was again consulted and patient was admitted hospital for concern of acute osteomyelitis.    Hospital Course:   Following conditions were addressed during hospitalization as listed below,  Acute on chronic osteomyelitis left foot secondary to MRSA infection, Diabetic foot infection Seen by podiatry during hospitalization.  Patient underwent left first metatarsal resection with wound excision closure on 11/12/2021 by Dr. March Rummage.  Patient was seen by infectious disease.  Antibiotics has been prescribed after placement of PICC line yesterday.  Spoke with ID, Dr. Graylon Good prior to discharge.  ID will follow the patient as outpatient.  Patient will need 6 weeks of total antibiotic.  Continue weightbearing left lower extremity with Cam walker boot.   Type 2 diabetes mellitus, with hyperglycemia Hemoglobin A1c was 9.44 patient was out of her insulin regimen.  Continue home insulin regimen on discharge.    Hyperkalemia:  Resolved at this time.  Potassium prior to discharge is 4.5.  Hyponatremia Improved.  Sodium prior to discharge 135.  Essential hypertension Patient is on Coreg and spironolactone.  Will be resumed on discharge.   Hx GI bleed/AVM/Iron deficiency anemia Continue iron sulfate.  Hemoglobin of 7.3 prior to discharge.  Will need outpatient monitoring.   Depression/anxiety:  Continue sertraline   Neuropathy: Continue gabapentin.   Hyperlipidemia.   Patient is on pravastatin as outpatient.  We will hold statins until daptomycin is done.   Nash cirrhosis with pancytopenia.  Resume spironolactone On discharge.  Will need outpatient CBC follow-up.  Disposition.  At this time, patient is stable for disposition home with outpatient PCP ID and podiatry follow-up.  Medical Consultants:   Podiatry, Dr.  Price Infectious disease, Dr. Graylon Good  Procedures:    Left first metatarsal resection,  wound excision and closure with skin graft; Dr. March Rummage 11/25  Subjective:   Today, patient was seen and examined at bedside.  Patient complains of mild headache but denies overt foot pain, fever chills or rigor.  Discharge Exam:   Vitals:   11/16/21 2100 11/17/21 0504  BP: (!) 123/41 (!) 100/45  Pulse: 69 68  Resp: 17 18  Temp: 98.2 F (36.8 C) 98.8 F (37.1 C)  SpO2: 100% 97%   Vitals:   11/16/21 0830 11/16/21 1433 11/16/21 2100 11/17/21 0504  BP: (!) 127/59 (!) 108/57 (!) 123/41 (!) 100/45  Pulse: 62 70 69 68  Resp: _0 Temp: 98 F (36.7 C) 98 F (36.7 C) 98.2 F (36.8 C) 98.8 F (37.1 C)  TempSrc: Oral Oral    SpO2: 100% 96% 100% 97%  Weight:      Height:       General: Alert awake, not in obvious distress HENT: pupils equally reacting to light,  No scleral pallor or icterus noted. Oral mucosa is moist.  Chest:  Clear breath sounds.  Diminished breath sounds bilaterally. No crackles or wheezes.  CVS: S1 &S2 heard. No murmur.  Regular rate and rhythm. Abdomen: Soft, nontender, nondistended.  Bowel sounds are heard.   Extremities: No cyanosis, clubbing or edema.  Peripheral pulses are palpable. Psych: Alert, awake and oriented, normal mood CNS:  No cranial nerve deficits.  Power equal in all extremities.   Skin: Warm and dry.  No rashes noted.  The results of significant diagnostics from this hospitalization (including imaging, microbiology, ancillary and laboratory) are listed below for reference.     Diagnostic Studies:   MR FOOT LEFT W WO CONTRAST  Result Date: 11/10/2021 CLINICAL DATA:  Possible osteomyelitis.  Open wound EXAM: MRI OF THE LEFT FOREFOOT WITHOUT AND WITH CONTRAST TECHNIQUE: Multiplanar, multisequence MR imaging of the left foot was performed both before and after administration of intravenous contrast. CONTRAST:  25m GADAVIST GADOBUTROL 1 MMOL/ML IV SOLN COMPARISON:  Left foot x-ray 11/09/2021 FINDINGS: Study is somewhat limited due to  motion. Bones/Joint/Cartilage Previous amputation of the toes. There is cortical destruction at the distal first metatarsal with marked pulmonary edema and enhancement throughout most of the first metatarsal with sparing of the base, consistent with acute osteomyelitis. No definite osteomyelitis in the second through fifth metatarsals. Patchy mild bone marrow edema and enhancement visualized in the cuneiform bones, cuboid bone and navicular bone felt to be most likely reactive/degenerative in nature. Ligaments No significant findings. Muscles and Tendons Mild edema in the intrinsic musculature of the foot. No evidence of tenosynovitis. Soft tissues Mild diffuse subcutaneous soft tissue swelling. Soft tissue swelling and enhancement most significant at the medial distal stump consistent with cellulitis. Soft tissue ulcer at the medial distal stump with tract extending to the distal first metatarsal. IMPRESSION: 1. Soft tissue ulceration at the distal medial foot stump with tract extending to the distal first metatarsal and surrounding cellulitis. 2. Acute osteomyelitis of the first metatarsal. 3. Other findings as described. Electronically Signed   By: DOfilia NeasM.D.   On: 11/10/2021 13:22   DG Foot Complete Left  Result Date: 11/09/2021 Please see detailed radiograph report in office note.    Labs:   Basic Metabolic Panel: Recent Labs  Lab 11/12/21 0302 11/13/21 0317 11/13/21 1121 11/14/21 0328 11/16/21 0339 11/17/21 0511  NA 135 130*  --  133*  135 135  K 4.8 5.3*   < > 4.3 4.8 4.5  CL 107 103  --  106 111 110  CO2 23 21*  --  21* 20* 22  GLUCOSE 119* 260*  --  178* 181* 113*  BUN 23 24*  --  23 27* 31*  CREATININE 0.76 0.81  --  0.76 0.82 0.69  CALCIUM 8.6* 8.5*  --  8.5* 8.6* 8.5*  MG  --  1.6*  --   --   --   --    < > = values in this interval not displayed.   GFR Estimated Creatinine Clearance: 78.5 mL/min (by C-G formula based on SCr of 0.69 mg/dL). Liver Function  Tests: No results for input(s): AST, ALT, ALKPHOS, BILITOT, PROT, ALBUMIN in the last 168 hours. No results for input(s): LIPASE, AMYLASE in the last 168 hours. No results for input(s): AMMONIA in the last 168 hours. Coagulation profile No results for input(s): INR, PROTIME in the last 168 hours.  CBC: Recent Labs  Lab 11/11/21 0330 11/12/21 0302 11/13/21 0317 11/16/21 0339 11/17/21 0511  WBC 1.8* 2.2* 2.2* 1.9* 2.1*  HGB 7.8* 8.2* 7.9* 7.3* 7.3*  HCT 24.9* 26.4* 26.0* 24.0* 23.5*  MCV 94.0 93.6 95.2 95.2 93.3  PLT 57* 63* 56* 60* 65*   Cardiac Enzymes: Recent Labs  Lab 11/17/21 0511  CKTOTAL 24*   BNP: Invalid input(s): POCBNP CBG: Recent Labs  Lab 11/16/21 0732 11/16/21 1210 11/16/21 1625 11/16/21 2058 11/17/21 0740  GLUCAP 139* 186* 250* 233* 90   D-Dimer No results for input(s): DDIMER in the last 72 hours. Hgb A1c No results for input(s): HGBA1C in the last 72 hours. Lipid Profile No results for input(s): CHOL, HDL, LDLCALC, TRIG, CHOLHDL, LDLDIRECT in the last 72 hours. Thyroid function studies No results for input(s): TSH, T4TOTAL, T3FREE, THYROIDAB in the last 72 hours.  Invalid input(s): FREET3 Anemia work up No results for input(s): VITAMINB12, FOLATE, FERRITIN, TIBC, IRON, RETICCTPCT in the last 72 hours. Microbiology Recent Results (from the past 240 hour(s))  Resp Panel by RT-PCR (Flu A&B, Covid) Nasopharyngeal Swab     Status: None   Collection Time: 11/09/21  9:19 PM   Specimen: Nasopharyngeal Swab; Nasopharyngeal(NP) swabs in vial transport medium  Result Value Ref Range Status   SARS Coronavirus 2 by RT PCR NEGATIVE NEGATIVE Final    Comment: (NOTE) SARS-CoV-2 target nucleic acids are NOT DETECTED.  The SARS-CoV-2 RNA is generally detectable in upper respiratory specimens during the acute phase of infection. The lowest concentration of SARS-CoV-2 viral copies this assay can detect is 138 copies/mL. A negative result does not preclude  SARS-Cov-2 infection and should not be used as the sole basis for treatment or other patient management decisions. A negative result may occur with  improper specimen collection/handling, submission of specimen other than nasopharyngeal swab, presence of viral mutation(s) within the areas targeted by this assay, and inadequate number of viral copies(<138 copies/mL). A negative result must be combined with clinical observations, patient history, and epidemiological information. The expected result is Negative.  Fact Sheet for Patients:  EntrepreneurPulse.com.au  Fact Sheet for Healthcare Providers:  IncredibleEmployment.be  This test is no t yet approved or cleared by the [place] FDA and  has been authorized for detection and/or diagnosis of SARS-CoV-2 by FDA under an Emergency Use Authorization (EUA). This EUA will remain  in effect (meaning this test can be used) for the duration of the COVID-19 declaration under Section 564(b)(1) of the  Act, 21 U.S.C.section 360bbb-3(b)(1), unless the authorization is terminated  or revoked sooner.       Influenza A by PCR NEGATIVE NEGATIVE Final   Influenza B by PCR NEGATIVE NEGATIVE Final    Comment: (NOTE) The Xpert Xpress SARS-CoV-2/FLU/RSV plus assay is intended as an aid in the diagnosis of influenza from Nasopharyngeal swab specimens and should not be used as a sole basis for treatment. Nasal washings and aspirates are unacceptable for Xpert Xpress SARS-CoV-2/FLU/RSV testing.  Fact Sheet for Patients: EntrepreneurPulse.com.au  Fact Sheet for Healthcare Providers: IncredibleEmployment.be  This test is not yet approved or cleared by the [place] FDA and has been authorized for detection and/or diagnosis of SARS-CoV-2 by FDA under an Emergency Use Authorization (EUA). This EUA will remain in effect (meaning this test can be used) for the duration of  the COVID-19 declaration under Section 564(b)(1) of the Act, 21 U.S.C. section 360bbb-3(b)(1), unless the authorization is terminated or revoked.  Performed at [facility], [place] [address] [place], [place] [address]   Aerobic/Anaerobic Culture w Gram Stain (surgical/deep wound)     Status: None   Collection Time: 11/12/21  1:39 PM   Specimen: PATH Soft tissue  Result Value Ref Range Status   Specimen Description WOUND  Final   Special Requests 1ST METATARSAL  Final   Gram Stain   Final    RARE WBC PRESENT, PREDOMINANTLY MONONUCLEAR NO ORGANISMS SEEN    Culture   Final    RARE METHICILLIN RESISTANT STAPHYLOCOCCUS AUREUS WITHIN MIXED ORGANISMS NO ANAEROBES ISOLATED Performed at [facility], [place] [address], [place], [place] [address]    Report Status 11/17/2021 FINAL  Final   Organism ID, Bacteria METHICILLIN RESISTANT STAPHYLOCOCCUS AUREUS  Final      Susceptibility   Methicillin resistant staphylococcus aureus - MIC*    CIPROFLOXACIN 2 INTERMEDIATE Intermediate     ERYTHROMYCIN >=8 RESISTANT Resistant     GENTAMICIN <=0.5 SENSITIVE Sensitive     OXACILLIN >=4 RESISTANT Resistant     TETRACYCLINE <=1 SENSITIVE Sensitive     VANCOMYCIN 1 SENSITIVE Sensitive     TRIMETH/SULFA <=10 SENSITIVE Sensitive     CLINDAMYCIN <=0.25 SENSITIVE Sensitive     RIFAMPIN <=0.5 SENSITIVE Sensitive     Inducible Clindamycin NEGATIVE Sensitive     * RARE METHICILLIN RESISTANT STAPHYLOCOCCUS AUREUS  MRSA Next Gen by PCR, Nasal     Status: None   Collection Time: 11/15/21 12:04 PM   Specimen: Nasal Mucosa; Nasal Swab  Result Value Ref Range Status   MRSA by PCR Next Gen NOT DETECTED NOT DETECTED Final    Comment: (NOTE) The GeneXpert MRSA Assay (FDA approved for NASAL specimens only), is one component of a comprehensive MRSA colonization surveillance program. It is not intended to diagnose MRSA infection nor to guide or monitor treatment for MRSA infections. Test  performance is not FDA approved in patients less than 26 years old. Performed at [facility], [place] [address] [place], [place] [address]      Discharge Instructions:   Discharge Instructions     [facility] pharmacist to adjust dose for Vancomycin, Aminoglycosides and other anti-infective therapies as requested by physician.   Complete by: As directed    [facility] to provide Cath Flo 62m   Complete by: As directed    Administer for PICC line occlusion and as ordered by physician for other access device issues.   Anaphylaxis Kit: Provided to treat any anaphylactic  reaction to the medication being provided to the patient if First Dose or when requested by physician   Complete by: As directed    Epinephrine 45m/ml vial / amp: Administer 0.375m(0.49m36msubcutaneously once for moderate to severe anaphylaxis, nurse to call physician and pharmacy when reaction occurs and call 911 if needed for immediate care   Diphenhydramine 47m24m IV vial: Administer 25-47mg40mIM PRN for first dose reaction, rash, itching, mild reaction, nurse to call physician and pharmacy when reaction occurs   Sodium Chloride 0.9% NS 500ml 60mAdminister if needed for hypovolemic blood pressure drop or as ordered by physician after call to physician with anaphylactic reaction   Call MD for:  severe uncontrolled pain   Complete by: As directed    Call MD for:  temperature >100.4   Complete by: As directed    Change dressing on IV access line weekly and PRN   Complete by: As directed    Diet - low sodium heart healthy   Complete by: As directed    Diet Carb Modified   Complete by: As directed    Discharge instructions   Complete by: As directed    Follow up with your primary care provider in one week. Follow up with Infectious disease clinic as scheduled by the clinic. Seek medical attention for worsening symptoms. Follow up with orthopedics in one week.WB LLE in CAM boot.    Discharge wound care:   Complete by: As directed    Cleanse wound LLE, dress with small piece of aquacell, followed by dry gauze, kerlix, and ACE bandage. Change daily. Start 11/14/21   Flush IV access with Sodium Chloride 0.9% and Heparin 10 units/ml or 100 units/ml   Complete by: As directed    Home infusion instructions - [facility]   Complete by: As directed    Instructions: Flush IV access with Sodium Chloride 0.9% and Heparin 10units/ml or 100units/ml   Change dressing on IV access line: Weekly and PRN   Instructions Cath Flo 2mg: A4mnister for PICC Line occlusion and as ordered by physician for other access device   [facility] pharmacist to adjust dose for: Vancomycin, Aminoglycosides and other anti-infective therapies as requested by physician   Increase activity slowly   Complete by: As directed    Method of administration may be changed at the discretion of home infusion pharmacist based upon assessment of the patient and/or caregiver's ability to self-administer the medication ordered   Complete by: As directed    Outpatient Parenteral Antibiotic Therapy Information Antibiotic: Daptomycin (Cubicin) IVPB; Indications for use: foot osteomyelitis; End Date: 12/24/2021   Complete by: As directed    Antibiotic: Daptomycin (Cubicin) IVPB   Indications for use: foot osteomyelitis   End Date: 12/24/2021      Allergies as of 11/17/2021       Reactions   Chlorhexidine Rash   Tape Rash        Medication List     TAKE these medications    Accu-Chek Guide test strip Generic drug: glucose blood   Accu-Chek Guide w/Device Kit   Accu-Chek Softclix Lancets lancets SMARTSIG:2 Topical Twice Daily   albuterol 108 (90 Base) MCG/ACT inhaler Commonly known as: VENTOLIN HFA Inhale 2 puffs into the lungs every 4 (four) hours as needed for shortness of breath or wheezing.   Calmoseptine 0.44-20.6 % Oint Generic drug: Menthol-Zinc Oxide Apply topically 3  (three) times daily.   carvedilol 6.25 MG tablet Commonly known as: COREG Take 1  tablet (6.25 mg total) by mouth 2 (two) times daily.   daptomycin  IVPB Commonly known as: CUBICIN Inject 700 mg into the vein daily. Indication:  foot osteomyelitis First Dose: No Last Day of Therapy:  12/24/2021 Labs - Once weekly:  CBC/D, BMP, and CPK Labs - Every other week:  ESR and CRP Method of administration: IV Push Method of administration may be changed at the discretion of home infusion pharmacist based upon assessment of the patient and/or caregiver's ability to self-administer the medication ordered.   ferrous sulfate 325 (65 FE) MG tablet Take 325 mg by mouth in the morning and at bedtime.   folic acid 1 MG tablet Commonly known as: FOLVITE Take 1 mg by mouth 2 (two) times daily.   furosemide 20 MG tablet Commonly known as: LASIX Take 20 mg by mouth See admin instructions. Takes 20 mg on Tuesdays & Thursdays   gabapentin 600 MG tablet Commonly known as: NEURONTIN Take 600 mg by mouth 3 (three) times daily.   Global Ease Inject Pen Needles 32G X 4 MM Misc Generic drug: Insulin Pen Needle   hydrOXYzine 25 MG tablet Commonly known as: ATARAX Take 25 mg by mouth daily.   ketorolac 0.5 % ophthalmic solution Commonly known as: ACULAR Place 1 drop into both eyes 4 (four) times daily.   metFORMIN 500 MG tablet Commonly known as: GLUCOPHAGE Take 500 mg by mouth 2 (two) times daily.   NovoLOG FlexPen 100 UNIT/ML FlexPen Generic drug: insulin aspart Inject 6 Units into the skin in the morning, at noon, in the evening, and at bedtime.   pantoprazole 40 MG tablet Commonly known as: PROTONIX Take 40 mg by mouth daily as needed (heartburn).   pravastatin 20 MG tablet Commonly known as: PRAVACHOL Take 1 tablet (20 mg total) by mouth at bedtime. Take after completion of daptomycin Start taking on: January 03, 2022 What changed:  additional instructions These instructions start on  January 03, 2022. If you are unsure what to do until then, ask your doctor or other care provider.   sertraline 100 MG tablet Commonly known as: ZOLOFT Take 150 mg by mouth daily.   spironolactone 50 MG tablet Commonly known as: ALDACTONE Take 50 mg by mouth every morning.   Toujeo Max SoloStar 300 UNIT/ML Solostar Pen Generic drug: insulin glargine (2 Unit Dial) Inject 65 Units into the skin in the morning and at bedtime.   vitamin C 500 MG tablet Commonly known as: ASCORBIC ACID Take 500 mg by mouth 2 (two) times daily.   Vitamin D (Ergocalciferol) 1.25 MG (50000 UNIT) Caps capsule Commonly known as: DRISDOL Take 1 capsule by mouth every 7 (seven) days. Mondays               Discharge Care Instructions  (From admission, onward)           Start     Ordered   11/17/21 0000  Change dressing on IV access line weekly and PRN  (Home infusion instructions - [facility] )        11/17/21 1223   11/17/21 0000  Discharge wound care:       Comments: Cleanse wound LLE, dress with small piece of aquacell, followed by dry gauze, kerlix, and ACE bandage. Change daily. Start 11/14/21   11/17/21 1223            Follow-up Information     [facility] Follow up.   Why: You will discharge home on IV antibiotics. RN  services will be provided through [place].                Time coordinating discharge: 39 minutes  Signed:  Martavis Gurney  [facility] 11/17/2021, 12:24 PM

## 2021-11-17 NOTE — TOC Transition Note (Signed)
Transition of Care [facility]) - CM/SW Discharge Note  Patient Details  Name: Ariel Arnold MRN: [id number] Date of Birth: 10/16/1956  [facility] CM/SW Contact:  Ewing Schlein, LCSW Phone Number: 11/17/2021, 12:38 PM  Clinical Narrative: Patient to discharge home today. OPAT orders have been signed. CSW notified [name] with [facility] regarding discharge. CSW left VM for patient's daughter, [name], regarding patient's discharge and that [facility] and [name] will be following up. TOC signing off.  Final next level of care: Home w Home Health Services Barriers to Discharge: Barriers Resolved  Patient Goals and CMS Choice Patient states their goals for this hospitalization and ongoing recovery are:: Return home CMS Medicare.gov Compare Post Acute Care list provided to:: Patient Choice offered to / list presented to : Patient  Discharge Plan and Services Post Acute Care Choice: Home Health          DME Arranged: N/A DME Agency: NA HH Arranged: RN, IV Antibiotics HH Agency: [facility] ([facility] to provide [facility] for IV antibiotics) Date HH Agency Contacted: 11/15/21 Time HH Agency Contacted: 1093 Representative spoke with at [facility] Agency: [name])  Readmission Risk Interventions Readmission Risk Prevention Plan 11/16/2021  Home Care Screening Complete  Medication Review (RN CM) Complete  Some recent data might be hidden

## 2021-11-18 ENCOUNTER — Encounter: Payer: Medicare Other | Admitting: Podiatry

## 2021-12-03 ENCOUNTER — Telehealth: Payer: Self-pay

## 2021-12-03 NOTE — Telephone Encounter (Signed)
Patient voice mail full, needs a follow up per Dr. Drue Second for the end of December, can be with any provider available

## 2021-12-17 ENCOUNTER — Other Ambulatory Visit: Payer: Self-pay

## 2021-12-17 ENCOUNTER — Encounter: Payer: Self-pay | Admitting: Cardiology

## 2021-12-17 ENCOUNTER — Ambulatory Visit (INDEPENDENT_AMBULATORY_CARE_PROVIDER_SITE_OTHER): Payer: Medicare Other | Admitting: Cardiology

## 2021-12-17 VITALS — BP 132/70 | HR 74 | Ht 68.0 in | Wt 195.0 lb

## 2021-12-17 DIAGNOSIS — R0789 Other chest pain: Secondary | ICD-10-CM | POA: Diagnosis not present

## 2021-12-17 DIAGNOSIS — R778 Other specified abnormalities of plasma proteins: Secondary | ICD-10-CM | POA: Diagnosis not present

## 2021-12-17 MED ORDER — CARVEDILOL 12.5 MG PO TABS
12.5000 mg | ORAL_TABLET | Freq: Two times a day (BID) | ORAL | 6 refills | Status: DC
Start: 2021-12-17 — End: 2022-04-12

## 2021-12-17 NOTE — Addendum Note (Signed)
Addended by: Lesle Chris on: 12/17/2021 02:17 PM   Modules accepted: Orders

## 2021-12-17 NOTE — Patient Instructions (Signed)
Medication Instructions:  Increase Coreg to 12.5mg twice a day   Continue all other medications.     Labwork: none  Testing/Procedures: none  Follow-Up: 6 months     Any Other Special Instructions Will Be Listed Below (If Applicable).   If you need a refill on your cardiac medications before your next appointment, please call your pharmacy.  

## 2021-12-17 NOTE — Progress Notes (Signed)
Clinical Summary Ariel Arnold is a 64 y.o.female seen today for follow upof the following medical problems.   Elevated troponin/Chest pain - trop elevated during 02/2021 admission with GI bleed, peak 306 - 02/2021 [facility] echo normal LVEF 55-60%, no WMAs   - occasional chest pains with exertion. Throbbing pain midchest radiates into both shoulders, can after activity. Was previously daily, now symptoms less often. Can occur with feeling stress. Lasts about 1.5 minutes, no other associated symptoms. Not positional     - occasinal pulsating/throbbing like feeling in chest. Can come on with exertion at times. Decrease in frequency.    2. GI bleeding - admit 05/2021 [facility] concern for variceal bleeding, had embolization. Admission Hgb was 6.7 - transfused 3 units prbcs - similar admission 02/2021 to [facility]       3. Cirrhosis secondary to NASH - prior issues with hepatic encephalopathy   4. Pancytopenia - chronic. Last labs 06/02/21 WBC 2.8 Hgb 8.2 Plt 71     5..Osteomyelitis with partial amputation of the left foot - more recent admission 10/2021 with MRSA infection left foot, further surgical intervention on foot.  Past Medical History:  Diagnosis Date   Anemia    Anxiety    Aortic atherosclerosis (HCC)    Arthritis    AVM (arteriovenous malformation) of colon    Depression    Essential hypertension    GI bleeding    Recurrent   Hepatic encephalopathy    History of kidney stones    History of RSV infection    Liver cirrhosis secondary to NASH ([place]) 2004   Biopsy-proven   Obesity    Osteomyelitis (HCC)    Peripheral vascular disease (HCC)    Thrombocytopenia (HCC)    Type 2 diabetes mellitus (HCC)      Allergies  Allergen Reactions   Chlorhexidine Rash   Tape Rash     Current Outpatient Medications  Medication Sig Dispense Refill   ACCU-CHEK GUIDE test strip      Accu-Chek Softclix Lancets lancets SMARTSIG:2 Topical Twice Daily     albuterol (VENTOLIN  HFA) 108 (90 Base) MCG/ACT inhaler Inhale 2 puffs into the lungs every 4 (four) hours as needed for shortness of breath or wheezing.     Blood Glucose Monitoring Suppl (ACCU-CHEK GUIDE) w/Device KIT      CALMOSEPTINE 0.44-20.6 % OINT Apply topically 3 (three) times daily.     carvedilol (COREG) 6.25 MG tablet Take 1 tablet (6.25 mg total) by mouth 2 (two) times daily. 180 tablet 3   clotrimazole-betamethasone (LOTRISONE) cream Apply topically 2 (two) times daily.     daptomycin (CUBICIN) IVPB Inject 700 mg into the vein daily. Indication:  foot osteomyelitis First Dose: No Last Day of Therapy:  12/24/2021 Labs - Once weekly:  CBC/D, BMP, and CPK Labs - Every other week:  ESR and CRP Method of administration: IV Push Method of administration may be changed at the discretion of home infusion pharmacist based upon assessment of the patient and/or caregiver's ability to self-administer the medication ordered. 38 Units 0   diclofenac Sodium (VOLTAREN) 1 % GEL Apply 2 g topically 4 (four) times daily.     ferrous sulfate 325 (65 FE) MG tablet Take 325 mg by mouth in the morning and at bedtime. (Patient not taking: Reported on 73/71/0626)     folic acid (FOLVITE) 1 MG tablet Take 1 mg by mouth 2 (two) times daily.     furosemide (LASIX) 20 MG tablet Take  20 mg by mouth See admin instructions. Takes 20 mg on Tuesdays & Thursdays     gabapentin (NEURONTIN) 600 MG tablet Take 600 mg by mouth 3 (three) times daily.     GLOBAL EASE INJECT PEN NEEDLES 32G X 4 MM MISC      hydrOXYzine (ATARAX/VISTARIL) 25 MG tablet Take 25 mg by mouth daily.     insulin aspart (NOVOLOG FLEXPEN) 100 UNIT/ML FlexPen Inject 6 Units into the skin in the morning, at noon, in the evening, and at bedtime.     ketorolac (ACULAR) 0.5 % ophthalmic solution Place 1 drop into both eyes 4 (four) times daily.     metFORMIN (GLUCOPHAGE) 500 MG tablet Take 500 mg by mouth 2 (two) times daily.     pantoprazole (PROTONIX) 40 MG tablet Take 40  mg by mouth daily as needed (heartburn).     [START ON 01/03/2022] pravastatin (PRAVACHOL) 20 MG tablet Take 1 tablet (20 mg total) by mouth at bedtime. Take after completion of daptomycin     sertraline (ZOLOFT) 100 MG tablet Take 150 mg by mouth daily.     spironolactone (ALDACTONE) 50 MG tablet Take 50 mg by mouth every morning.     TOUJEO MAX SOLOSTAR 300 UNIT/ML Solostar Pen Inject 65 Units into the skin in the morning and at bedtime.     vitamin C (ASCORBIC ACID) 500 MG tablet Take 500 mg by mouth 2 (two) times daily.     Vitamin D, Ergocalciferol, (DRISDOL) 1.25 MG (50000 UNIT) CAPS capsule Take 1 capsule by mouth every 7 (seven) days. Mondays     No current facility-administered medications for this visit.     Past Surgical History:  Procedure Laterality Date   ABDOMINAL HYSTERECTOMY     COLONOSCOPY     Cystoscopy with ureteral stent     ESOPHAGOGASTRODUODENOSCOPY     ESOPHAGOGASTRODUODENOSCOPY (EGD) WITH PROPOFOL N/A 05/20/2021   Procedure: ESOPHAGOGASTRODUODENOSCOPY (EGD) WITH PROPOFOL;  Surgeon: Daneil Dolin, MD;  [facility];  Service: Endoscopy;  Laterality: N/A;  needs intubation   IR ANGIOGRAM FOLLOW UP STUDY  05/21/2021   IR ANGIOGRAM SELECTIVE EACH ADDITIONAL VESSEL  05/21/2021   IR EMBO VENOUS NOT HEMORR HEMANG  INC GUIDE ROADMAPPING  05/21/2021   IR US GUIDE VASC ACCESS RIGHT  05/21/2021   IR VENOGRAM RENAL UNI LEFT  05/21/2021   RADIOLOGY WITH ANESTHESIA N/A 05/21/2021   Procedure: IR WITH ANESTHESIA;  Surgeon: Radiologist, Medication, MD;  Location: [facility];  Service: Radiology;  Laterality: N/A;   Toe amputations Bilateral    TUBAL LIGATION     WOUND DEBRIDEMENT Left 11/12/2021   Procedure: DEBRIDEMENT WOUND;FIRST METATARSAL RESECTION; APPLICATION OF SKIN GRAFT SUBSTITUTE;  Surgeon: Evelina Bucy, DPM;  Location: [facility];  Service: Podiatry;  Laterality: Left;     Allergies  Allergen Reactions   Chlorhexidine Rash   Tape Rash      Family History   Problem Relation Age of Onset   Heart failure Mother    Hyperlipidemia Mother    Diabetes Mellitus II Mother    Heart failure Father    Hyperlipidemia Father      Social History Ariel Arnold reports that she has quit smoking. Her smoking use included cigarettes. She has never used smokeless tobacco. Ariel Arnold reports current alcohol use.   Review of Systems CONSTITUTIONAL: No weight loss, fever, chills, weakness or fatigue.  HEENT: Eyes: No visual loss, blurred vision, double vision or yellow sclerae.No hearing loss, sneezing, congestion,  runny nose or sore throat.  SKIN: No rash or itching.  CARDIOVASCULAR: per hpi RESPIRATORY: No shortness of breath, cough or sputum.  GASTROINTESTINAL: No anorexia, nausea, vomiting or diarrhea. No abdominal pain or blood.  GENITOURINARY: No burning on urination, no polyuria NEUROLOGICAL: No headache, dizziness, syncope, paralysis, ataxia, numbness or tingling in the extremities. No change in bowel or bladder control.  MUSCULOSKELETAL: No muscle, back pain, joint pain or stiffness.  LYMPHATICS: No enlarged nodes. No history of splenectomy.  PSYCHIATRIC: No history of depression or anxiety.  ENDOCRINOLOGIC: No reports of sweating, cold or heat intolerance. No polyuria or polydipsia.  Marland Kitchen   Physical Examination Today's Vitals   12/17/21 1357  BP: 132/70  Pulse: 74  SpO2: 98%  Weight: 195 lb (88.5 kg)  Height: $Remove'5\' 8"'taiUXjh$  (1.727 m)   Body mass index is 29.65 kg/m.  Gen: resting comfortably, no acute distress HEENT: no scleral icterus, pupils equal round and reactive, no palptable cervical adenopathy,  CV: RRR, no m/r/g no jvd Resp: Clear to auscultation bilaterally GI: abdomen is soft, non-tender, non-distended, normal bowel sounds, no hepatosplenomegaly MSK: extremities are warm, no edema.  Skin: warm, no rash Neuro:  no focal deficits Psych: appropriate affect   Diagnostic Studies  02/2021 echo [facility] Left Ventricle  Normal left  ventricular size. Wall thickness is normal. EF: 55-60%. Wall motion is normal. Doppler parameters indicate normal diastolic function.   Right Ventricle  Right ventricle is normal. Systolic function is normal.   Left Atrium  Left atrium is normal in size.   Right Atrium  Normal sized right atrium.   Mitral Valve  There is mild regurgitation.   Tricuspid Valve  Tricuspid valve structure is normal. There is trace regurgitation. The right ventricular systolic pressure is normal (<36 mmHg).   Aortic Valve  The aortic valve is tricuspid. The leaflets are not thickened and exhibit normal excursion. There is no regurgitation or stenosis.   Pulmonic Valve  The pulmonic valve was not well visualized. Trace regurgitation.   Ascending Aorta  The aortic root is normal in size.   Pericardium  There is no pericardial effusion.    Assessment and Plan  1.Elevated troponin/Chest pain - likely secondary to severe anemia at time of prior admission. Unclear if may have some component of chronic CAD - would not be a candidate for stress testing as she would not be a candidate for cath since cannot take antiplatelet agents due to history of bleeding esophageal varices, chronic anemia, and thrombocytopenia - chronic somewhat atypical chest pains, will try increasing coreg emperically to see if helps with symptoms.       Arnoldo Lenis, M.D.

## 2022-01-07 ENCOUNTER — Encounter: Payer: Medicare Other | Admitting: Podiatry

## 2022-01-17 ENCOUNTER — Encounter: Payer: Self-pay | Admitting: Podiatry

## 2022-01-18 ENCOUNTER — Other Ambulatory Visit: Payer: Self-pay

## 2022-01-18 ENCOUNTER — Other Ambulatory Visit: Payer: Self-pay | Admitting: Podiatry

## 2022-01-18 ENCOUNTER — Ambulatory Visit (INDEPENDENT_AMBULATORY_CARE_PROVIDER_SITE_OTHER): Payer: Medicare Other | Admitting: Podiatry

## 2022-01-18 ENCOUNTER — Ambulatory Visit (INDEPENDENT_AMBULATORY_CARE_PROVIDER_SITE_OTHER): Payer: Self-pay

## 2022-01-18 DIAGNOSIS — Z9889 Other specified postprocedural states: Secondary | ICD-10-CM

## 2022-01-18 DIAGNOSIS — M86172 Other acute osteomyelitis, left ankle and foot: Secondary | ICD-10-CM

## 2022-01-18 DIAGNOSIS — M86672 Other chronic osteomyelitis, left ankle and foot: Secondary | ICD-10-CM

## 2022-01-21 LAB — WOUND CULTURE
MICRO NUMBER:: 12943583
SPECIMEN QUALITY:: ADEQUATE

## 2022-01-21 LAB — HOUSE ACCOUNT TRACKING

## 2022-01-25 ENCOUNTER — Ambulatory Visit (INDEPENDENT_AMBULATORY_CARE_PROVIDER_SITE_OTHER): Payer: Medicare HMO | Admitting: Podiatry

## 2022-01-25 ENCOUNTER — Other Ambulatory Visit: Payer: Self-pay

## 2022-01-25 ENCOUNTER — Ambulatory Visit (HOSPITAL_COMMUNITY): Admission: RE | Admit: 2022-01-25 | Payer: Self-pay | Source: Ambulatory Visit

## 2022-01-25 DIAGNOSIS — M86172 Other acute osteomyelitis, left ankle and foot: Secondary | ICD-10-CM

## 2022-01-25 DIAGNOSIS — M86672 Other chronic osteomyelitis, left ankle and foot: Secondary | ICD-10-CM

## 2022-01-25 NOTE — Progress Notes (Signed)
Subjective:  Patient ID: Ariel Arnold, female    DOB: [birth year],  MRN: [id number]  Chief Complaint  Patient presents with   Wound Check    Acute osteomyelitis of left foot ([place])  Status post surgery  Chronic osteomyelitis of ankle and foot, left ([name]     66 y.o. female presents for wound care. Hx confirmed with patient. Did not get the blood work done nor the MRI. Has appt today for vascular testing. Complains of drainage and odor to the wound. Pain rated 8/10. Objective:  Physical Exam: Wound Location: left 1st metatarsal area Wound Measurement: 2*1 Wound Base: Mixed Granular/Fibrotic Peri-wound: Reddened Exudate: Scant/small amount Serosanguinous exudate Wound probes to bone Foot warm to touch, palpable DP pulse faint PT.  Assessment:   1. Chronic osteomyelitis of ankle and foot, left ([name])   2. Acute osteomyelitis of left foot ([name])    Plan:  Patient was evaluated and treated and all questions answered.  Ulcer left foot -Pending blood work - CBC, ESR, CRP - patient advised to get this done today -Wound culture results pending -MRI pending. -Non-invasive vascular studies to be performed today. -Dressed with prisma and DSD. Leave intact until next visit. -No signs of infection today warranting abx therapy. -Report prompt worsening or proceed to the ED if worsening redness, warmth, erythema -F/u 1 week for recheck  Return in about 1 week (around 02/01/2022) for Wound Care.

## 2022-01-25 NOTE — Progress Notes (Signed)
Subjective:  Patient ID: Ariel Arnold, female    DOB: 1956/11/24,  MRN: [id number]  Chief Complaint  Patient presents with   Routine Post Op    #1 DOS 11/12/2021(last post op 02/10/2022 then will be OV) DEBRIDEMENT WOUND;FIRST METATARSAL RESECTION; APPLICATION OF SKIN GRAFT SUBSTITUTE;LEFT   66 y.o. female presents for wound care. Hx confirmed with patient. Has not followed up since surgery. Does not appear to have been taking care of the wound. Objective:  Physical Exam: Wound Location: left 1st metatarsal area Wound Measurement: 2*2 Wound Base: Mixed Granular/Fibrotic Peri-wound: Reddened Exudate: Scant/small amount Serosanguinous exudate Wound probes to bone Foot warm to touch, palpable DP pulse faint PT.  No images are attached to the encounter.  Radiographs:  X-ray of the left foot: continued erosion distal 1st metatarsal, no SQ air. Assessment:   1. Acute osteomyelitis of left foot ([place])   2. Status post surgery   3. Chronic osteomyelitis of ankle and foot, left ([place])    Plan:  Patient was evaluated and treated and all questions answered.  Ulcer left foot -Order blood work - CBC, ESR, CRP -Wound culture today -MRI to evaluate for continued osteomyelitis -Non-invasive vascular studies. -Dressed with betadine WTD. Leave intact until next visit. -Report prompt worsening or proceed to the ED if worsening redness, warmth, erythema -F/u 1 week for recheck  No follow-ups on file.

## 2022-02-01 ENCOUNTER — Encounter: Payer: Medicare HMO | Admitting: Podiatry

## 2022-02-04 ENCOUNTER — Telehealth: Payer: Self-pay | Admitting: Cardiology

## 2022-02-04 NOTE — Telephone Encounter (Signed)
Patient's daughter is calling to schedule the patients blood flow appt.

## 2022-02-07 NOTE — Telephone Encounter (Signed)
Left message to return call 

## 2022-02-08 LAB — CBC WITH DIFFERENTIAL/PLATELET
Absolute Monocytes: 211 cells/uL (ref 200–950)
Basophils Absolute: 10 cells/uL (ref 0–200)
Basophils Relative: 0.3 %
Eosinophils Absolute: 202 cells/uL (ref 15–500)
Eosinophils Relative: 6.3 %
HCT: 36.1 % (ref 35.0–45.0)
Hemoglobin: 11.7 g/dL (ref 11.7–15.5)
Lymphs Abs: 1021 cells/uL (ref 850–3900)
MCH: 31.9 pg (ref 27.0–33.0)
MCHC: 32.4 g/dL (ref 32.0–36.0)
MCV: 98.4 fL (ref 80.0–100.0)
MPV: 11 fL (ref 7.5–12.5)
Monocytes Relative: 6.6 %
Neutro Abs: 1757 cells/uL (ref 1500–7800)
Neutrophils Relative %: 54.9 %
Platelets: 62 10*3/uL — ABNORMAL LOW (ref 140–400)
RBC: 3.67 10*6/uL — ABNORMAL LOW (ref 3.80–5.10)
RDW: 13.6 % (ref 11.0–15.0)
Total Lymphocyte: 31.9 %
WBC: 3.2 10*3/uL — ABNORMAL LOW (ref 3.8–10.8)

## 2022-02-08 LAB — SEDIMENTATION RATE: Sed Rate: 11 mm/h (ref 0–30)

## 2022-02-08 LAB — C-REACTIVE PROTEIN: CRP: 4.7 mg/L (ref <8.0)

## 2022-02-08 NOTE — Telephone Encounter (Signed)
Informed daughter that she should call the foot doctor back to find out what specific testing is needed.  She verbalized understanding.

## 2022-02-14 ENCOUNTER — Ambulatory Visit
Admission: RE | Admit: 2022-02-14 | Discharge: 2022-02-14 | Disposition: A | Discharge status: Home / Self Care | Payer: Medicare HMO | Source: Ambulatory Visit | Attending: Podiatry | Admitting: Podiatry

## 2022-02-14 DIAGNOSIS — M86672 Other chronic osteomyelitis, left ankle and foot: Secondary | ICD-10-CM

## 2022-02-14 IMAGING — MR MR FOOT*L* W/O CM
4 of 6 series · 29 of 40 positions shown · non-contrast
Comparison: left foot radiographs [DATE] [DATE]; MRI left
forefoot [DATE]

CLINICAL DATA: Foot swelling. Diabetic. Osteomyelitis suspected.
Marker over area of open wound.

EXAM:
MRI OF THE LEFT FOOT WITHOUT CONTRAST
TECHNIQUE: Multiplanar, multisequence MR imaging of the left forefoot was
performed. No intravenous contrast was administered.

[Series 5: ti short axis · coronal · left · 4.0mm · 0.31mm/px · 8 of 30 slices shown]
[im 1/30]
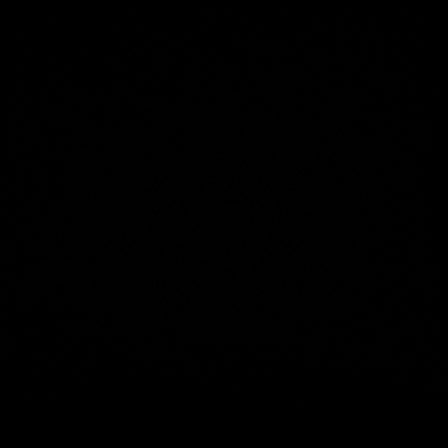
[im 5/30]
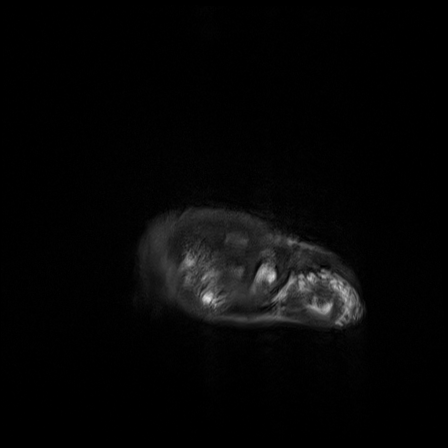
[im 9/30]
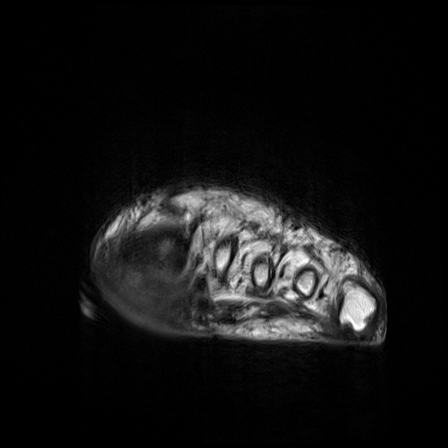
[im 13/30]
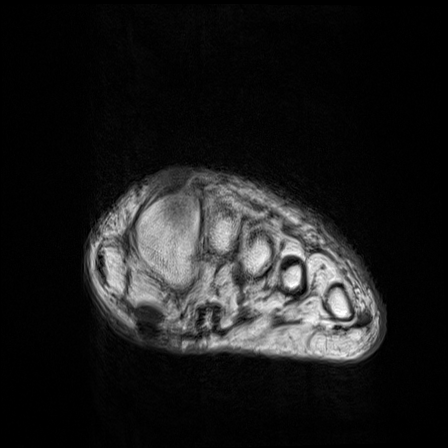
[im 17/30]
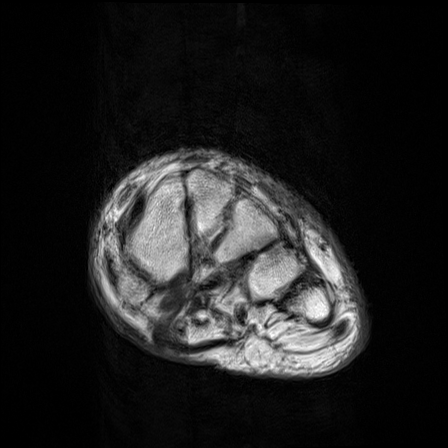
[im 21/30]
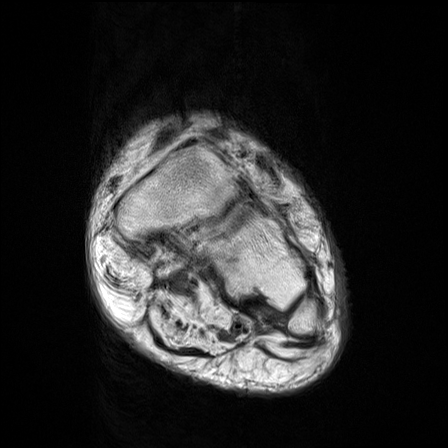
[im 25/30]
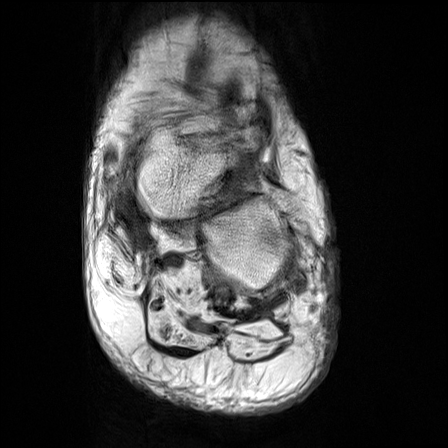
[im 30/30]
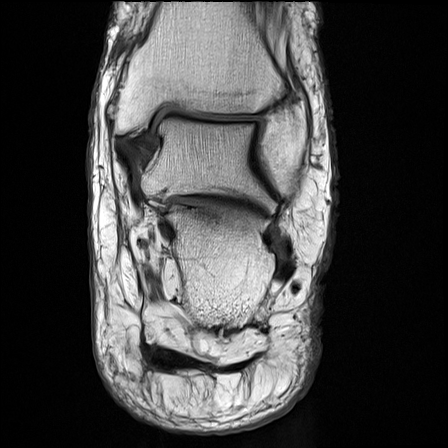

[Series 6: T1 fat-sat · coronal · left · 4.0mm · 0.31mm/px · 8 of 30 slices shown]
[im 1/30]
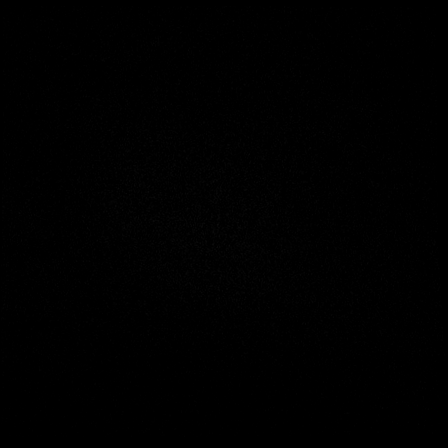
[im 5/30]
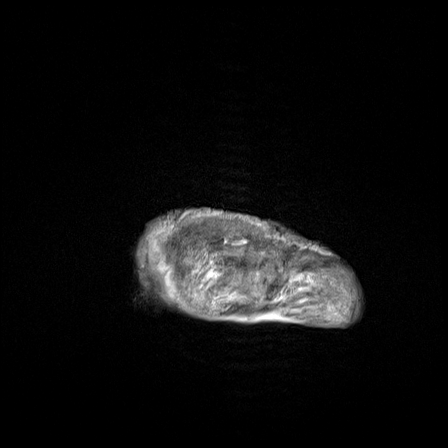
[im 9/30]
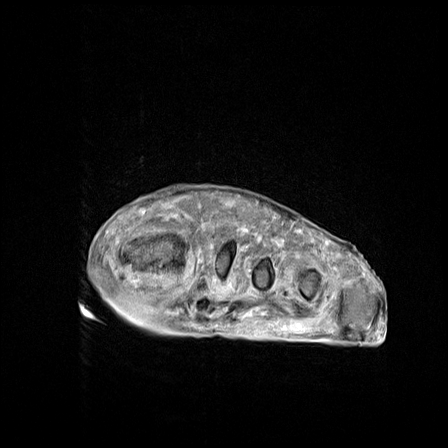
[im 13/30]
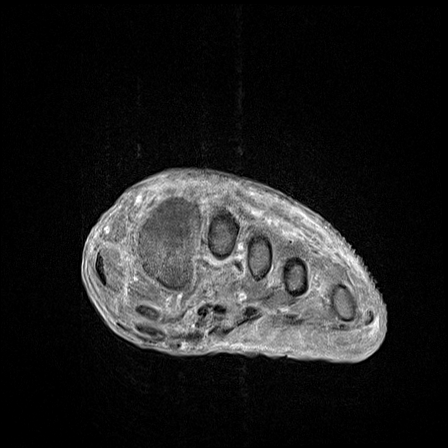
[im 17/30]
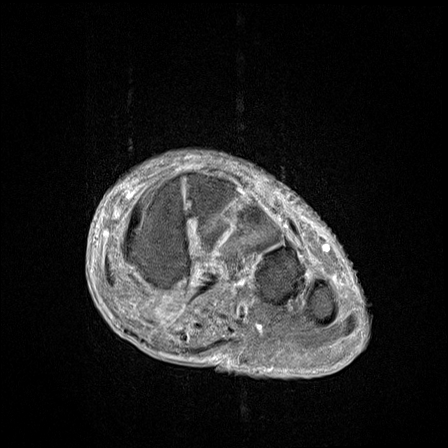
[im 21/30]
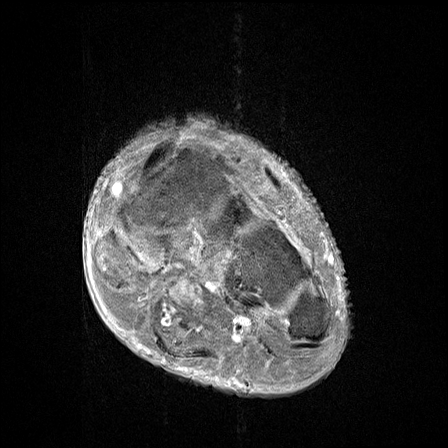
[im 25/30]
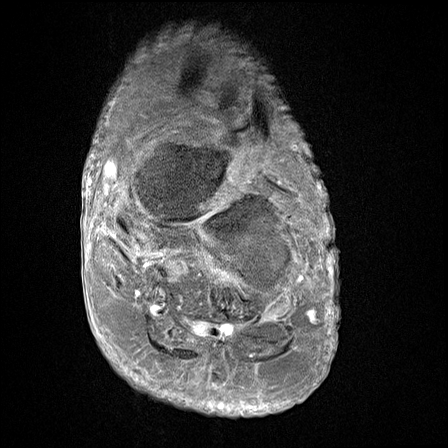
[im 30/30]
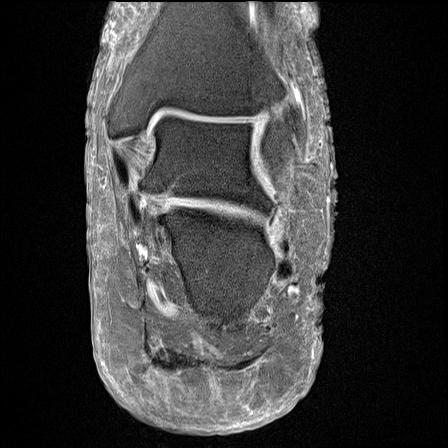

[Series 8: T1 · axial · left · 3.0mm · 0.40mm/px · z∈[-67,-4]mm · 5 of 20 slices shown]
[im 1/20]
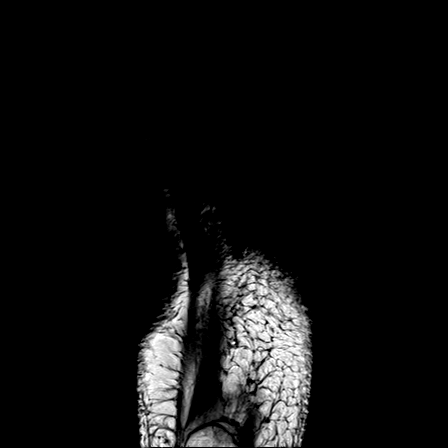
[im 5/20]
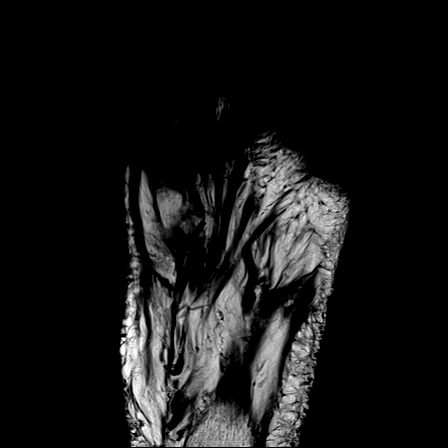
[im 10/20]
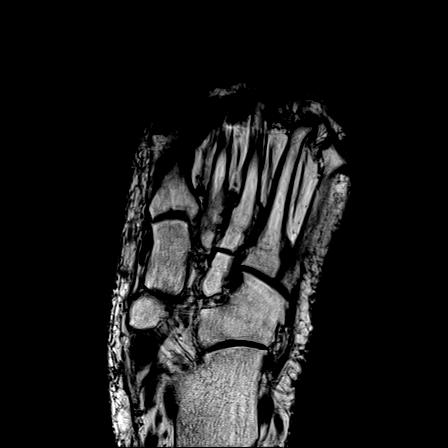
[im 15/20]
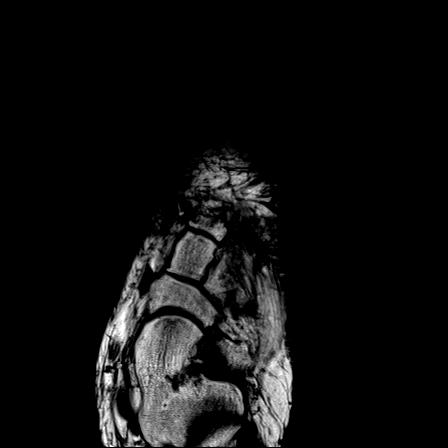
[im 20/20]
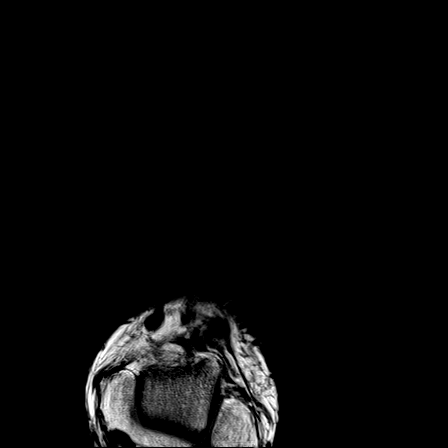

[Series 11: T2 fat-sat · coronal · left · 4.0mm · 0.47mm/px · 8 of 30 slices shown]
[im 1/30]
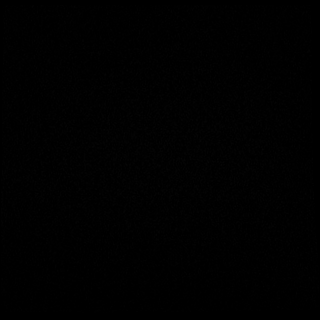
[im 5/30]
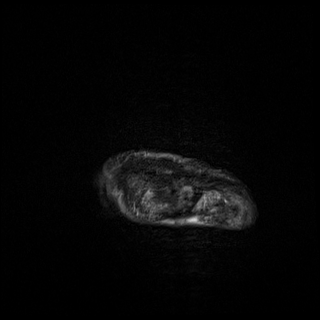
[im 9/30]
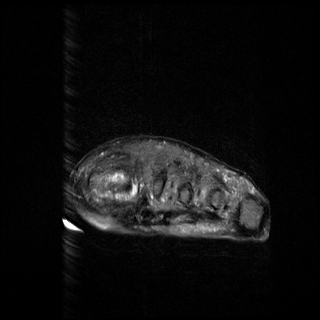
[im 13/30]
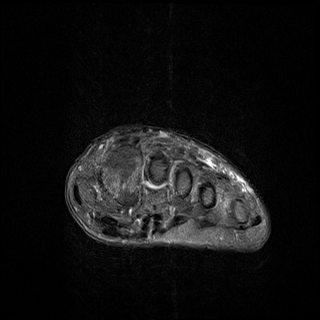
[im 17/30]
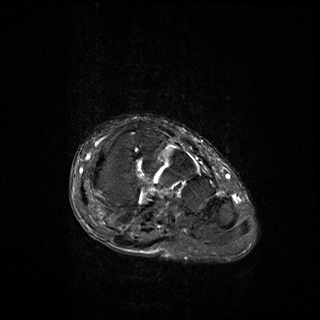
[im 21/30]
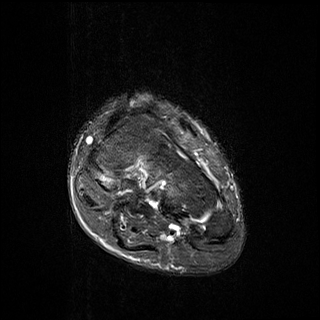
[im 25/30]
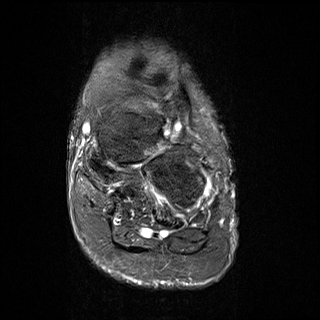
[im 30/30]
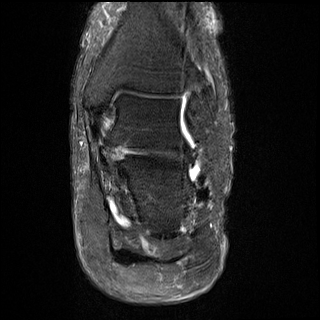

[29 of 40 positions shown; findings below may reference images not displayed]

FINDINGS: Bones/Joint/Cartilage

Postsurgical changes are again seen of amputation of the first
through fifth rays at the level of the distal aspect of the
metatarsal. There is progressive bone loss of the distal aspect of
the remaining first metatarsal, now including the majority of the
metatarsal head. Given the sinus tract extending from the distal
medial stump to the bone in this region of the high-grade marrow
edema erosion previously indicating active osteomyelitis on
[DATE] MRI, this progressive erosion is likely from progressive
osteomyelitis. There is persistent high-grade marrow edema seen
throughout the remaining first metatarsal shaft with again only the
base demonstrating normal marrow signal. There is a persistent sinus
tract within the distal stump contacting the distal bone (sagittal
series 10, image 6).

Within the limitations of motion artifact no definite acute
osteomyelitis is seen within the second through fifth ray.

Moderate joint space narrowing and subchondral degenerative cystic
changes throughout the calcaneocuboid, talonavicular and
navicular-cuneiform articulations within the midfoot.

Ligaments

The Lisfranc ligament complex is intact.

Muscles and Tendons

No gross tendon tear. High-grade fatty infiltration of the plantar
and dorsal intrinsic musculature similar to prior.

Soft tissues

Mild dorsal midfoot subcutaneous fat edema and swelling.
IMPRESSION: Compared to [DATE]:

Persistent sinus tract within the plantar aspect of the distal first
ray stump contacting the remaining distal aspect of the first
metatarsal. Progressive erosion with persistent high-grade marrow
edema within the first metatarsal likely progressive/ongoing
osteomyelitis.

## 2022-02-15 ENCOUNTER — Telehealth: Payer: Self-pay | Admitting: *Deleted

## 2022-02-15 NOTE — Telephone Encounter (Signed)
-----   Message from Ariel Arnold, [place] sent at 02/15/2022  7:36 AM EST ----- Let patient know that his MRI suggest that there is still infection in the bone at the amputation stump. He should make a follow up to be see by Dr. Blenda Mounts in case he needs surgery and monitor symptoms if fever, chills, nausea, vomiting or excessive drainage from the wound he should report to office sooner or go to ER Thanks Dr. Cannon Kettle

## 2022-02-15 NOTE — Telephone Encounter (Signed)
Called and left a message for the patient and relayed the message and stated to call the [place] office if any concerns or questions about the message I left. Ariel Arnold

## 2022-02-21 NOTE — Telephone Encounter (Signed)
Pt called stating she is returning a call and I scheduled her to follow up with Dr Ralene Cork but she has no available openings for several weeks and pt has new spot on right toe. I scheduled pt for Dr Marylene Land next week and Dr Ralene Cork on 4.3.. ?

## 2022-02-21 NOTE — Telephone Encounter (Signed)
I did offer pt appt sooner but she did not have transportation to get the appt for this week. ?

## 2022-03-01 ENCOUNTER — Ambulatory Visit: Payer: Medicare HMO | Admitting: Sports Medicine

## 2022-03-21 ENCOUNTER — Ambulatory Visit: Payer: Medicare HMO | Admitting: Podiatry

## 2022-03-30 ENCOUNTER — Ambulatory Visit: Payer: Medicare Other | Admitting: Podiatry

## 2022-04-02 ENCOUNTER — Encounter (HOSPITAL_COMMUNITY): Payer: Self-pay | Admitting: Family Medicine

## 2022-04-02 ENCOUNTER — Other Ambulatory Visit: Payer: Self-pay | Admitting: Family Medicine

## 2022-04-02 ENCOUNTER — Inpatient Hospital Stay (HOSPITAL_COMMUNITY)
Admission: AD | Admit: 2022-04-02 | Discharge: 2022-04-12 | DRG: 475 | Disposition: A | Discharge status: Skilled Nursing Facility | Payer: Medicare Other | Source: Other Acute Inpatient Hospital | Attending: Internal Medicine | Admitting: Internal Medicine

## 2022-04-02 ENCOUNTER — Other Ambulatory Visit: Payer: Self-pay

## 2022-04-02 DIAGNOSIS — K219 Gastro-esophageal reflux disease without esophagitis: Secondary | ICD-10-CM | POA: Diagnosis present

## 2022-04-02 DIAGNOSIS — F32A Depression, unspecified: Secondary | ICD-10-CM | POA: Diagnosis present

## 2022-04-02 DIAGNOSIS — K766 Portal hypertension: Secondary | ICD-10-CM | POA: Diagnosis present

## 2022-04-02 DIAGNOSIS — L03116 Cellulitis of left lower limb: Secondary | ICD-10-CM | POA: Diagnosis not present

## 2022-04-02 DIAGNOSIS — I700 Atherosclerosis of aorta: Secondary | ICD-10-CM | POA: Diagnosis present

## 2022-04-02 DIAGNOSIS — M86672 Other chronic osteomyelitis, left ankle and foot: Secondary | ICD-10-CM | POA: Diagnosis not present

## 2022-04-02 DIAGNOSIS — Y835 Amputation of limb(s) as the cause of abnormal reaction of the patient, or of later complication, without mention of misadventure at the time of the procedure: Secondary | ICD-10-CM | POA: Diagnosis present

## 2022-04-02 DIAGNOSIS — E11649 Type 2 diabetes mellitus with hypoglycemia without coma: Secondary | ICD-10-CM | POA: Diagnosis not present

## 2022-04-02 DIAGNOSIS — Z20822 Contact with and (suspected) exposure to covid-19: Secondary | ICD-10-CM | POA: Diagnosis present

## 2022-04-02 DIAGNOSIS — E1140 Type 2 diabetes mellitus with diabetic neuropathy, unspecified: Secondary | ICD-10-CM | POA: Diagnosis present

## 2022-04-02 DIAGNOSIS — D631 Anemia in chronic kidney disease: Secondary | ICD-10-CM | POA: Diagnosis present

## 2022-04-02 DIAGNOSIS — D696 Thrombocytopenia, unspecified: Secondary | ICD-10-CM | POA: Diagnosis present

## 2022-04-02 DIAGNOSIS — Z90710 Acquired absence of both cervix and uterus: Secondary | ICD-10-CM

## 2022-04-02 DIAGNOSIS — M86172 Other acute osteomyelitis, left ankle and foot: Secondary | ICD-10-CM | POA: Diagnosis present

## 2022-04-02 DIAGNOSIS — K7581 Nonalcoholic steatohepatitis (NASH): Secondary | ICD-10-CM | POA: Diagnosis present

## 2022-04-02 DIAGNOSIS — Z89432 Acquired absence of left foot: Secondary | ICD-10-CM | POA: Diagnosis not present

## 2022-04-02 DIAGNOSIS — T8744 Infection of amputation stump, left lower extremity: Principal | ICD-10-CM | POA: Diagnosis present

## 2022-04-02 DIAGNOSIS — N179 Acute kidney failure, unspecified: Secondary | ICD-10-CM | POA: Diagnosis not present

## 2022-04-02 DIAGNOSIS — E669 Obesity, unspecified: Secondary | ICD-10-CM | POA: Diagnosis present

## 2022-04-02 DIAGNOSIS — N182 Chronic kidney disease, stage 2 (mild): Secondary | ICD-10-CM | POA: Diagnosis present

## 2022-04-02 DIAGNOSIS — E119 Type 2 diabetes mellitus without complications: Secondary | ICD-10-CM

## 2022-04-02 DIAGNOSIS — D62 Acute posthemorrhagic anemia: Secondary | ICD-10-CM | POA: Diagnosis not present

## 2022-04-02 DIAGNOSIS — L0390 Cellulitis, unspecified: Principal | ICD-10-CM | POA: Diagnosis present

## 2022-04-02 DIAGNOSIS — Z79899 Other long term (current) drug therapy: Secondary | ICD-10-CM

## 2022-04-02 DIAGNOSIS — Z87891 Personal history of nicotine dependence: Secondary | ICD-10-CM

## 2022-04-02 DIAGNOSIS — E1169 Type 2 diabetes mellitus with other specified complication: Secondary | ICD-10-CM | POA: Diagnosis not present

## 2022-04-02 DIAGNOSIS — E1122 Type 2 diabetes mellitus with diabetic chronic kidney disease: Secondary | ICD-10-CM | POA: Diagnosis present

## 2022-04-02 DIAGNOSIS — Z6828 Body mass index (BMI) 28.0-28.9, adult: Secondary | ICD-10-CM

## 2022-04-02 DIAGNOSIS — Z888 Allergy status to other drugs, medicaments and biological substances status: Secondary | ICD-10-CM

## 2022-04-02 DIAGNOSIS — E782 Mixed hyperlipidemia: Secondary | ICD-10-CM | POA: Diagnosis not present

## 2022-04-02 DIAGNOSIS — R188 Other ascites: Secondary | ICD-10-CM | POA: Diagnosis present

## 2022-04-02 DIAGNOSIS — E1151 Type 2 diabetes mellitus with diabetic peripheral angiopathy without gangrene: Secondary | ICD-10-CM | POA: Diagnosis not present

## 2022-04-02 DIAGNOSIS — M869 Osteomyelitis, unspecified: Secondary | ICD-10-CM | POA: Diagnosis not present

## 2022-04-02 DIAGNOSIS — E872 Acidosis, unspecified: Secondary | ICD-10-CM | POA: Diagnosis present

## 2022-04-02 DIAGNOSIS — Z7985 Long-term (current) use of injectable non-insulin antidiabetic drugs: Secondary | ICD-10-CM

## 2022-04-02 DIAGNOSIS — Z794 Long term (current) use of insulin: Secondary | ICD-10-CM

## 2022-04-02 DIAGNOSIS — Z91048 Other nonmedicinal substance allergy status: Secondary | ICD-10-CM

## 2022-04-02 DIAGNOSIS — K7460 Unspecified cirrhosis of liver: Secondary | ICD-10-CM | POA: Diagnosis present

## 2022-04-02 DIAGNOSIS — D638 Anemia in other chronic diseases classified elsewhere: Secondary | ICD-10-CM | POA: Diagnosis present

## 2022-04-02 DIAGNOSIS — M1990 Unspecified osteoarthritis, unspecified site: Secondary | ICD-10-CM | POA: Diagnosis present

## 2022-04-02 DIAGNOSIS — L02612 Cutaneous abscess of left foot: Secondary | ICD-10-CM | POA: Diagnosis present

## 2022-04-02 DIAGNOSIS — Z833 Family history of diabetes mellitus: Secondary | ICD-10-CM

## 2022-04-02 DIAGNOSIS — N189 Chronic kidney disease, unspecified: Secondary | ICD-10-CM | POA: Diagnosis present

## 2022-04-02 DIAGNOSIS — Z7984 Long term (current) use of oral hypoglycemic drugs: Secondary | ICD-10-CM

## 2022-04-02 DIAGNOSIS — I10 Essential (primary) hypertension: Secondary | ICD-10-CM | POA: Diagnosis present

## 2022-04-02 DIAGNOSIS — E1165 Type 2 diabetes mellitus with hyperglycemia: Secondary | ICD-10-CM | POA: Diagnosis present

## 2022-04-02 DIAGNOSIS — F419 Anxiety disorder, unspecified: Secondary | ICD-10-CM | POA: Diagnosis present

## 2022-04-02 LAB — GLUCOSE, CAPILLARY: Glucose-Capillary: 214 mg/dL — ABNORMAL HIGH (ref 70–99)

## 2022-04-02 MED ORDER — SPIRONOLACTONE 25 MG PO TABS: 50.0000 mg | ORAL_TABLET | Freq: Every day | ORAL | Status: DC | PRN

## 2022-04-02 MED ORDER — INSULIN ASPART 100 UNIT/ML IJ SOLN
4.0000 [IU] | Freq: Three times a day (TID) | INTRAMUSCULAR | Status: DC
  Administered 2022-04-02 – 2022-04-11 (×13): 4 [IU] via SUBCUTANEOUS

## 2022-04-02 MED ORDER — SERTRALINE HCL 100 MG PO TABS
200.0000 mg | ORAL_TABLET | Freq: Every day | ORAL | Status: DC
  Administered 2022-04-02 – 2022-04-10 (×7): 200 mg via ORAL
  Filled 2022-04-02 (×7): qty 2

## 2022-04-02 MED ORDER — INSULIN GLARGINE-YFGN 100 UNIT/ML ~~LOC~~ SOLN
65.0000 [IU] | Freq: Every day | SUBCUTANEOUS | Status: DC
  Administered 2022-04-02 – 2022-04-11 (×9): 65 [IU] via SUBCUTANEOUS
  Filled 2022-04-02 (×12): qty 0.65

## 2022-04-02 MED ORDER — PANTOPRAZOLE SODIUM 40 MG PO TBEC
40.0000 mg | DELAYED_RELEASE_TABLET | Freq: Every day | ORAL | Status: DC | PRN
  Administered 2022-04-02 – 2022-04-07 (×2): 40 mg via ORAL
  Filled 2022-04-02 (×2): qty 1

## 2022-04-02 MED ORDER — PRAVASTATIN SODIUM 20 MG PO TABS
20.0000 mg | ORAL_TABLET | Freq: Every day | ORAL | Status: DC
  Administered 2022-04-02 – 2022-04-11 (×10): 20 mg via ORAL
  Filled 2022-04-02 (×10): qty 1

## 2022-04-02 MED ORDER — KETOROLAC TROMETHAMINE 0.5 % OP SOLN
1.0000 [drp] | Freq: Four times a day (QID) | OPHTHALMIC | Status: DC | PRN
  Filled 2022-04-02: qty 3

## 2022-04-02 MED ORDER — INSULIN ASPART 100 UNIT/ML IJ SOLN
0.0000 [IU] | Freq: Every day | INTRAMUSCULAR | Status: DC
  Administered 2022-04-02: 2 [IU] via SUBCUTANEOUS
  Administered 2022-04-03: 3 [IU] via SUBCUTANEOUS
  Administered 2022-04-07: 2 [IU] via SUBCUTANEOUS

## 2022-04-02 MED ORDER — CARVEDILOL 6.25 MG PO TABS
6.2500 mg | ORAL_TABLET | Freq: Two times a day (BID) | ORAL | Status: DC
  Administered 2022-04-02 – 2022-04-12 (×19): 6.25 mg via ORAL
  Filled 2022-04-02 (×19): qty 1

## 2022-04-02 MED ORDER — GABAPENTIN 300 MG PO CAPS
600.0000 mg | ORAL_CAPSULE | Freq: Three times a day (TID) | ORAL | Status: DC | PRN
  Administered 2022-04-02 – 2022-04-11 (×4): 600 mg via ORAL
  Filled 2022-04-02 (×5): qty 2

## 2022-04-02 MED ORDER — FUROSEMIDE 20 MG PO TABS: 20.0000 mg | ORAL_TABLET | Freq: Every day | ORAL | Status: DC | PRN

## 2022-04-02 MED ORDER — FERROUS SULFATE 325 (65 FE) MG PO TABS
325.0000 mg | ORAL_TABLET | Freq: Three times a day (TID) | ORAL | Status: DC
  Administered 2022-04-02 – 2022-04-12 (×24): 325 mg via ORAL
  Filled 2022-04-02 (×24): qty 1

## 2022-04-02 MED ORDER — INSULIN ASPART 100 UNIT/ML IJ SOLN
0.0000 [IU] | Freq: Three times a day (TID) | INTRAMUSCULAR | Status: DC
  Administered 2022-04-02: 11 [IU] via SUBCUTANEOUS
  Administered 2022-04-03: 5 [IU] via SUBCUTANEOUS
  Administered 2022-04-04: 3 [IU] via SUBCUTANEOUS
  Administered 2022-04-04: 11 [IU] via SUBCUTANEOUS
  Administered 2022-04-04 – 2022-04-05 (×2): 2 [IU] via SUBCUTANEOUS
  Administered 2022-04-06 (×2): 3 [IU] via SUBCUTANEOUS
  Administered 2022-04-07: 2 [IU] via SUBCUTANEOUS
  Administered 2022-04-08 – 2022-04-09 (×2): 3 [IU] via SUBCUTANEOUS
  Administered 2022-04-09: 2 [IU] via SUBCUTANEOUS
  Administered 2022-04-10: 3 [IU] via SUBCUTANEOUS
  Administered 2022-04-10: 2 [IU] via SUBCUTANEOUS
  Administered 2022-04-10 – 2022-04-11 (×2): 3 [IU] via SUBCUTANEOUS
  Administered 2022-04-11: 5 [IU] via SUBCUTANEOUS
  Administered 2022-04-12: 3 [IU] via SUBCUTANEOUS

## 2022-04-02 NOTE — Plan of Care (Signed)
  Problem: Nutrition: Goal: Adequate nutrition will be maintained Outcome: Progressing   Problem: Safety: Goal: Ability to remain free from injury will improve Outcome: Progressing   

## 2022-04-02 NOTE — H&P (Addendum)
?History and Physical  ? ? ?Ariel Arnold FTD:[id number] DOB: 1956/01/03 DOA: 04/02/2022 ? ?PCP: Jacqualine Code, DO  ?Patient coming from: Home ? ?Chief Complaint: Blood coming from foot ? ?HPI: Ariel Arnold is a 66 y.o. female with medical history significant of aortic atherosclerosis, peripheral vascular disease, type 2 diabetes on insulin, liver cirrhosis secondary to NASH, hypertension, thrombocytopenia, and depression/anxiety.  One week prior to admission, she was experiencing a trail of blood coming from her left foot.  She has history of osteomyelitis previously seen by Dr. March Rummage of the podiatry team.  He performed a transmetatarsal amputation leaving an open wound for drainage.  Chronic osteomyelitis continued to be an issue and she was lost to follow-up over the past 2 months.  She has a history of neuropathy where she cannot feel anything on her feet.  She denies pain, fevers, shortness of breath.  MRI performed on 02/14/22 showed likely persistent osteomyelitis. ? ?ED Course: Seen in [facility].  ESR elevated at 64, lactic acid 2.7.  No leukocytosis.  X-ray reportedly unremarkable. ? ?Review of Systems: As per HPI otherwise 10 point review of systems negative.  ? ?Past Medical History:  ?Diagnosis Date  ? Anemia   ? Anxiety   ? Aortic atherosclerosis ([place])   ? Arthritis   ? AVM (arteriovenous malformation) of colon   ? Depression   ? Essential hypertension   ? GI bleeding   ? Recurrent  ? Hepatic encephalopathy   ? History of kidney stones   ? History of RSV infection   ? Liver cirrhosis secondary to NASH [facility]) 2004  ? Biopsy-proven  ? Obesity   ? Osteomyelitis ([place])   ? Peripheral vascular disease ([employer])   ? Thrombocytopenia ([place])   ? Type 2 diabetes mellitus ([place])   ? ? ?Past Surgical History:  ?Procedure Laterality Date  ? ABDOMINAL HYSTERECTOMY    ? COLONOSCOPY    ? Cystoscopy with ureteral stent    ? ESOPHAGOGASTRODUODENOSCOPY    ? ESOPHAGOGASTRODUODENOSCOPY (EGD) WITH PROPOFOL N/A  05/20/2021  ? Procedure: ESOPHAGOGASTRODUODENOSCOPY (EGD) WITH PROPOFOL;  Surgeon: Daneil Dolin, MD;  [facility];  Service: Endoscopy;  Laterality: N/A;  needs intubation  ? IR ANGIOGRAM FOLLOW UP STUDY  05/21/2021  ? IR ANGIOGRAM SELECTIVE EACH ADDITIONAL VESSEL  05/21/2021  ? IR EMBO VENOUS NOT HEMORR HEMANG  INC GUIDE ROADMAPPING  05/21/2021  ? IR US GUIDE VASC ACCESS RIGHT  05/21/2021  ? IR VENOGRAM RENAL UNI LEFT  05/21/2021  ? RADIOLOGY WITH ANESTHESIA N/A 05/21/2021  ? Procedure: IR WITH ANESTHESIA;  Surgeon: Radiologist, Medication, MD;  Location: [place];  Service: Radiology;  Laterality: N/A;  ? Toe amputations Bilateral   ? TUBAL LIGATION    ? WOUND DEBRIDEMENT Left 11/12/2021  ? Procedure: DEBRIDEMENT WOUND;FIRST METATARSAL RESECTION; APPLICATION OF SKIN GRAFT SUBSTITUTE;  Surgeon: Evelina Bucy, DPM;  Location: [facility];  Service: Podiatry;  Laterality: Left;  ? ? ? reports that she has quit smoking. Her smoking use included cigarettes. She has never used smokeless tobacco. She reports current alcohol use. She reports that she does not use drugs. ? ?Allergies  ?Allergen Reactions  ? Chlorhexidine Rash  ? Tape Rash  ? ? ?Family History  ?Problem Relation Age of Onset  ? Heart failure Mother   ? Hyperlipidemia Mother   ? Diabetes Mellitus II Mother   ? Heart failure Father   ? Hyperlipidemia Father   ? ? ?Prior to Admission medications   ?  Medication Sig Start Date End Date Taking? Authorizing Provider  ?ACCU-CHEK GUIDE test strip  08/27/21   [provider]  ?Accu-Chek Softclix Lancets lancets SMARTSIG:2 Topical Twice Daily 08/27/21   [provider]  ?albuterol (VENTOLIN HFA) 108 (90 Base) MCG/ACT inhaler Inhale 2 puffs into the lungs every 4 (four) hours as needed for shortness of breath or wheezing. 01/11/21   [provider]  ?Blood Glucose Monitoring Suppl (ACCU-CHEK GUIDE) w/Device KIT  08/27/21   [provider]  ?CALMOSEPTINE 0.44-20.6 % OINT Apply topically 3 (three)  times daily. 10/16/21   [provider]  ?carvedilol (COREG) 12.5 MG tablet Take 1 tablet (12.5 mg total) by mouth 2 (two) times daily. 12/17/21   Arnoldo Lenis, MD  ?clotrimazole-betamethasone (LOTRISONE) cream Apply topically 2 (two) times daily. 11/25/21   [provider]  ?diclofenac Sodium (VOLTAREN) 1 % GEL Apply 2 g topically 4 (four) times daily. 12/02/21   [provider]  ?diclofenac Sodium (VOLTAREN) 1 % GEL Apply topically. 12/02/21   [provider]  ?ferrous sulfate 325 (65 FE) MG tablet Take 325 mg by mouth in the morning and at bedtime.    [provider]  ?folic acid (FOLVITE) 1 MG tablet Take 1 mg by mouth 2 (two) times daily. 06/02/21   [provider]  ?furosemide (LASIX) 20 MG tablet Take 20 mg by mouth See admin instructions. Takes 20 mg on Tuesdays & Thursdays 05/27/21   [provider]  ?gabapentin (NEURONTIN) 600 MG tablet Take 600 mg by mouth 3 (three) times daily. 07/23/21   [provider]  ?GLOBAL EASE INJECT PEN NEEDLES 32G X 4 MM MISC  08/27/21   [provider]  ?Cleda Clarks 100 UNIT/ML KwikPen Inject into the skin as directed. 12/09/21   [provider]  ?hydrOXYzine (ATARAX/VISTARIL) 25 MG tablet Take 25 mg by mouth daily. 02/20/21   [provider]  ?insulin aspart (NOVOLOG FLEXPEN) 100 UNIT/ML FlexPen Inject 6 Units into the skin in the morning, at noon, in the evening, and at bedtime. ?Patient not taking: Reported on 12/17/2021 03/21/21   [provider]  ?ketorolac (ACULAR) 0.5 % ophthalmic solution Place 1 drop into both eyes 4 (four) times daily. 09/06/21   [provider]  ?metFORMIN (GLUCOPHAGE) 500 MG tablet Take 500 mg by mouth 2 (two) times daily. 09/24/21   [provider]  ?pantoprazole (PROTONIX) 40 MG tablet Take 40 mg by mouth daily as needed (heartburn). 05/27/21   [provider]  ?pravastatin (PRAVACHOL) 20 MG tablet Take 1 tablet (20 mg  total) by mouth at bedtime. Take after completion of daptomycin 01/03/22   [name], [name], MD  ?sertraline (ZOLOFT) 100 MG tablet Take 150 mg by mouth daily. 03/09/21   [provider]  ?sertraline (ZOLOFT) 100 MG tablet Take 2 tablets by mouth daily.    [provider]  ?spironolactone (ALDACTONE) 50 MG tablet Take 50 mg by mouth every morning. 05/27/21   [provider]  ?Lenon Curt SOLOSTAR 300 UNIT/ML Solostar Pen Inject 65 Units into the skin in the morning and at bedtime. 06/03/21   [provider]  ?vitamin C (ASCORBIC ACID) 500 MG tablet Take 500 mg by mouth 2 (two) times daily.    [provider]  ?Vitamin D, Ergocalciferol, (DRISDOL) 1.25 MG (50000 UNIT) CAPS capsule Take 1 capsule by mouth every 7 (seven) days. Mondays 04/05/21   [provider]  ? ? ?Physical Exam: ?Vitals:  ? 04/02/22 0933  04/02/22 1311  ?BP: 118/63 (!) 114/56  ?Pulse: 71 78  ?Resp: 18 16  ?Temp: 98.2 ?F (36.8 ?C) 98.4 ?F (36.9 ?C)  ?TempSrc: Oral Oral  ?SpO2: 99% 95%  ? ? ?Constitutional: NAD, calm, comfortable ?Eyes: PERRL, lids and conjunctivae normal ?ENMT: Mucous membranes are moist.  ?Neck: normal, supple, no masses, no thyromegaly ?Respiratory: clear to auscultation bilaterally, no wheezing, no crackles. Normal respiratory effort. No accessory muscle use.  ?Cardiovascular: RRR. No extremity edema. 2+ pedal pulses. No carotid bruits.  ?Abdomen: no tenderness, no masses palpated. No hepatosplenomegaly. Bowel sounds positive.  ?Musculoskeletal: All 4 limbs present, left foot with a dressing present, no visible erythema; no tenderness to palpation, there is what appears to be of bloody and purulent discharge over the distal portion of the foot/dressing ?Skin: no rashes, lesions, ulcers otherwise noted.  ?Neurologic: CN 2-12 grossly intact.  ?Psychiatric: Normal judgment and insight. Alert and oriented x 3. Normal mood.  ? ?Labs on Admission: I have personally reviewed following labs  and imaging studies ? ?CBC: ?WBC: 5.2 ?HB: 10.8 ?HCT: 33.0 ?Platelets: 85 ? ?Complete metabolic Panel: ?Na: 129 ?K: 3.6 ?Cl: 97 ?Co2: 28.2 ?AG: 4 ?eGFR: 56 ?Glucose: 357 ?Ca: 8.4 ?Alk Phos: 124 ? ?Sepsis La

## 2022-04-03 ENCOUNTER — Inpatient Hospital Stay (HOSPITAL_COMMUNITY): Payer: Medicare Other

## 2022-04-03 DIAGNOSIS — M86672 Other chronic osteomyelitis, left ankle and foot: Secondary | ICD-10-CM | POA: Diagnosis not present

## 2022-04-03 DIAGNOSIS — L0390 Cellulitis, unspecified: Secondary | ICD-10-CM

## 2022-04-03 LAB — BASIC METABOLIC PANEL
Anion gap: 5 (ref 5–15)
BUN: 20 mg/dL (ref 8–23)
CO2: 26 mmol/L (ref 22–32)
Calcium: 7.9 mg/dL — ABNORMAL LOW (ref 8.9–10.3)
Chloride: 102 mmol/L (ref 98–111)
Creatinine, Ser: 0.88 mg/dL (ref 0.44–1.00)
GFR, Estimated: >60 mL/min (ref >60)
Glucose, Bld: 189 mg/dL — ABNORMAL HIGH (ref 70–99)
Potassium: 3.7 mmol/L (ref 3.5–5.1)
Sodium: 133 mmol/L — ABNORMAL LOW (ref 135–145)

## 2022-04-03 LAB — HEPATIC FUNCTION PANEL
ALT: 13 U/L (ref 0–44)
AST: 24 U/L (ref 15–41)
Albumin: 2 g/dL — ABNORMAL LOW (ref 3.5–5.0)
Alkaline Phosphatase: 85 U/L (ref 38–126)
Bilirubin, Direct: 0.3 mg/dL — ABNORMAL HIGH (ref 0.0–0.2)
Indirect Bilirubin: 0.8 mg/dL (ref 0.3–0.9)
Total Bilirubin: 1.1 mg/dL (ref 0.3–1.2)
Total Protein: 6 g/dL — ABNORMAL LOW (ref 6.5–8.1)

## 2022-04-03 LAB — CBC
HCT: 27.6 % — ABNORMAL LOW (ref 36.0–46.0)
Hemoglobin: 9.4 g/dL — ABNORMAL LOW (ref 12.0–15.0)
MCH: 33.5 pg (ref 26.0–34.0)
MCHC: 34.1 g/dL (ref 30.0–36.0)
MCV: 98.2 fL (ref 80.0–100.0)
Platelets: 94 10*3/uL — ABNORMAL LOW (ref 150–400)
RBC: 2.81 MIL/uL — ABNORMAL LOW (ref 3.87–5.11)
RDW: 14.8 % (ref 11.5–15.5)
WBC: 4 10*3/uL (ref 4.0–10.5)
nRBC: 0 % (ref 0.0–0.2)

## 2022-04-03 LAB — GLUCOSE, CAPILLARY: Glucose-Capillary: 296 mg/dL — ABNORMAL HIGH (ref 70–99)

## 2022-04-03 LAB — LACTIC ACID, PLASMA: Lactic Acid, Venous: 2.1 mmol/L (ref 0.5–1.9)

## 2022-04-03 LAB — HEMOGLOBIN A1C
Hgb A1c MFr Bld: 11.2 % — ABNORMAL HIGH (ref 4.8–5.6)
Mean Plasma Glucose: 274.74 mg/dL

## 2022-04-03 IMAGING — MR MR FOOT*L* W/O CM
6 series · 40 of 40 positions shown · non-contrast
Comparison: Left foot x-rays dated [DATE]. MRI left foot
dated [DATE].

CLINICAL DATA: Left foot wound.

EXAM:
MRI OF THE LEFT FOOT WITHOUT CONTRAST
TECHNIQUE: Multiplanar, multisequence MR imaging of the left forefoot was
performed. No intravenous contrast was administered.

[Series 5: T1 · coronal · left · 3.0mm · 0.47mm/px · 8 of 44 slices shown (1 of 3)]
[im 1/44]
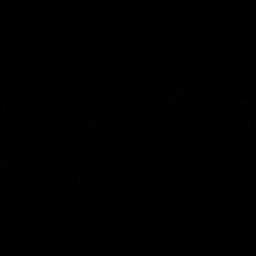
[im 7/44]
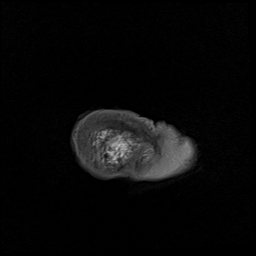
[im 13/44]
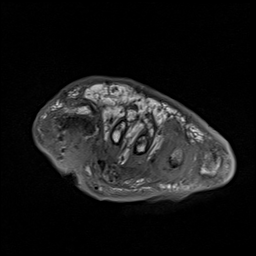
[im 19/44]
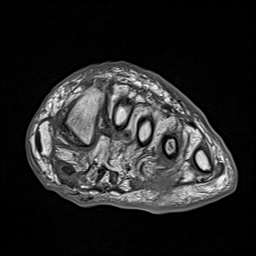
[im 25/44]
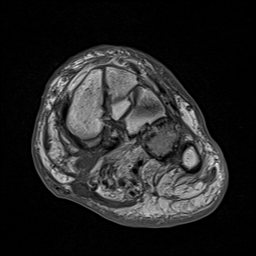
[im 31/44]
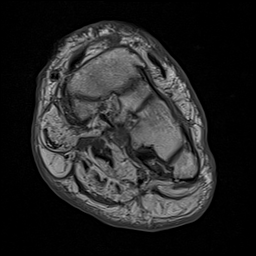
[im 37/44]
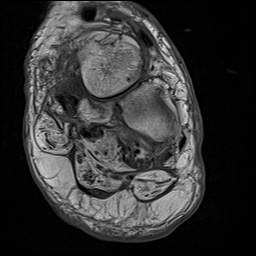
[im 44/44]
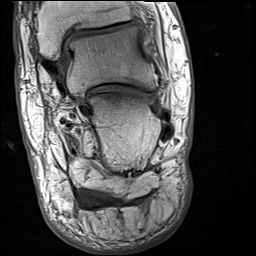

[Series 6: T2 fat-sat · coronal · left · 3.0mm · 0.38mm/px · 8 of 45 slices shown (1 of 2)]
[im 1/45]
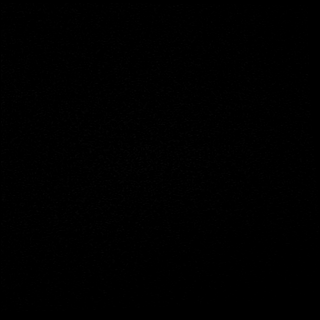
[im 7/45]
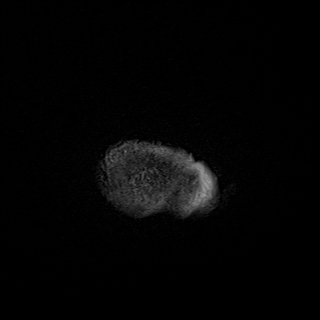
[im 13/45]
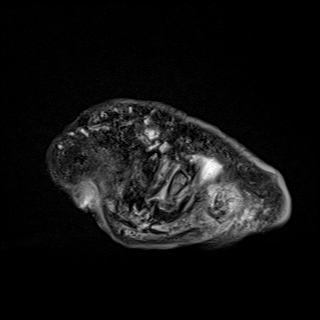
[im 19/45]
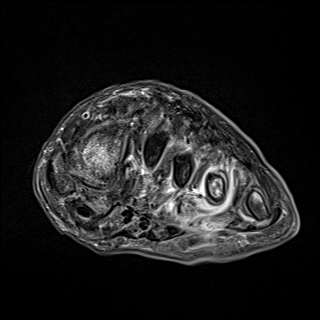
[im 26/45]
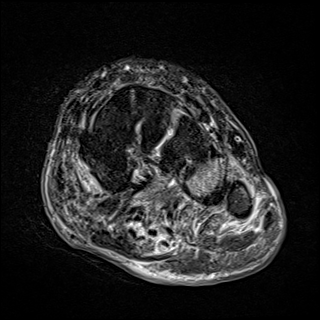
[im 32/45]
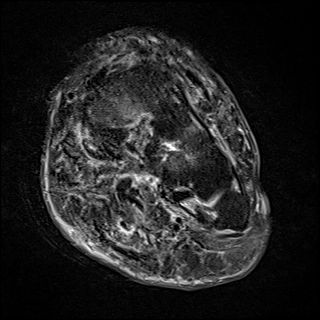
[im 38/45]
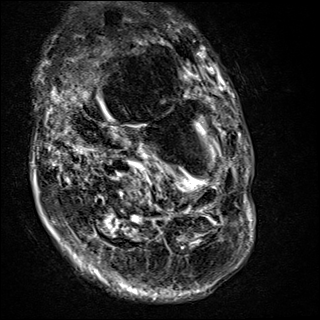
[im 45/45]
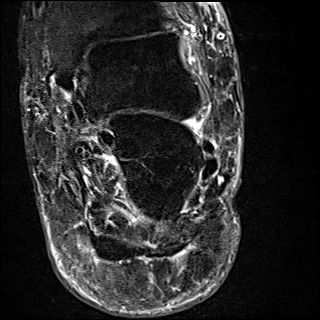

[Series 7: T2 fat-sat · oblique · left · 3.0mm · 0.70mm/px · 6 of 31 slices shown (2 of 2)]
[im 1/31]
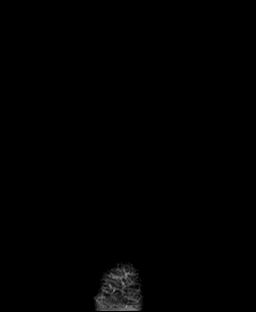
[im 7/31]
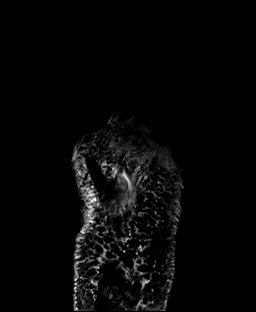
[im 13/31]
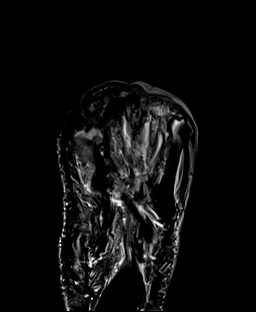
[im 19/31]
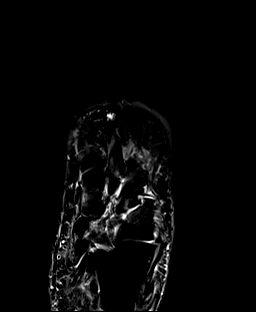
[im 25/31]
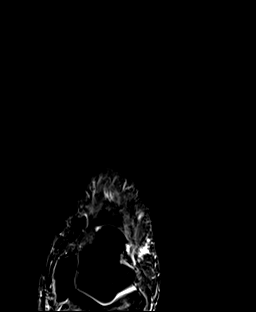
[im 31/31]
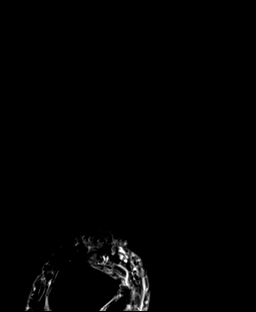

[Series 8: T1 · oblique · left · 3.0mm · 0.70mm/px · 6 of 32 slices shown (2 of 3)]
[im 1/32]
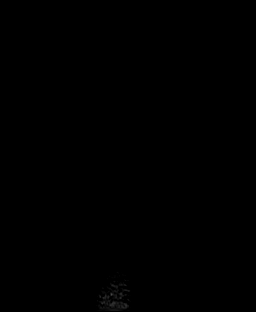
[im 7/32]
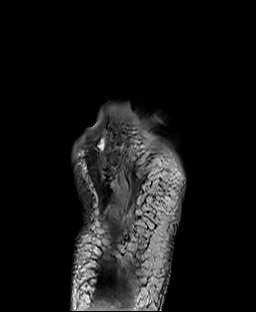
[im 13/32]
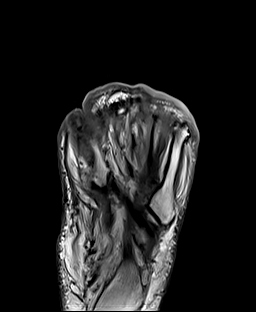
[im 19/32]
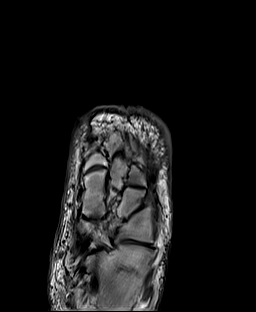
[im 25/32]
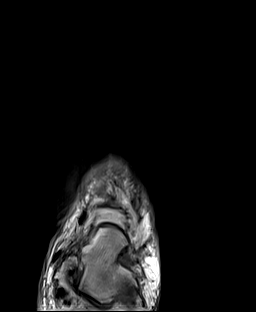
[im 32/32]
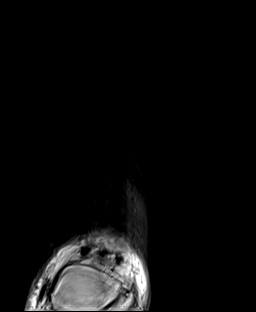

[Series 10: T1 · oblique · left · 3.0mm · 0.70mm/px · 6 of 32 slices shown (3 of 3)]
[im 1/32]
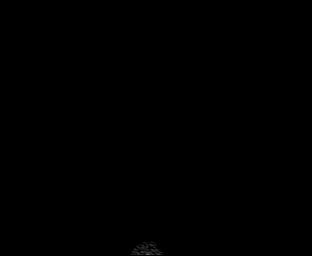
[im 7/32]
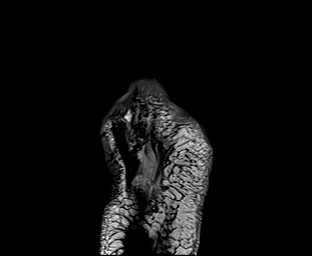
[im 13/32]
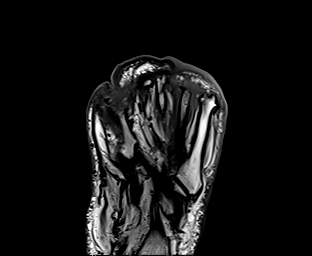
[im 19/32]
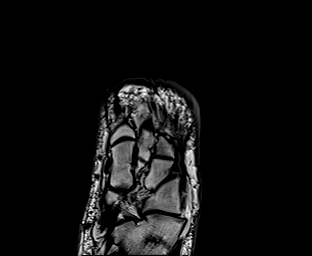
[im 25/32]
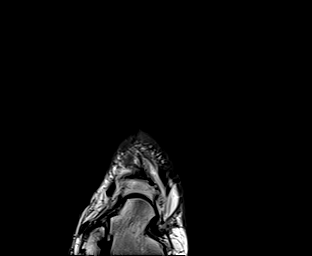
[im 32/32]
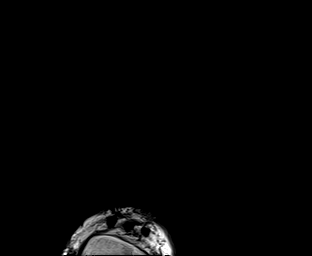

[Series 11: STIR · sagittal · left · 3.0mm · 0.35mm/px · 6 of 34 slices shown]
[im 1/34]
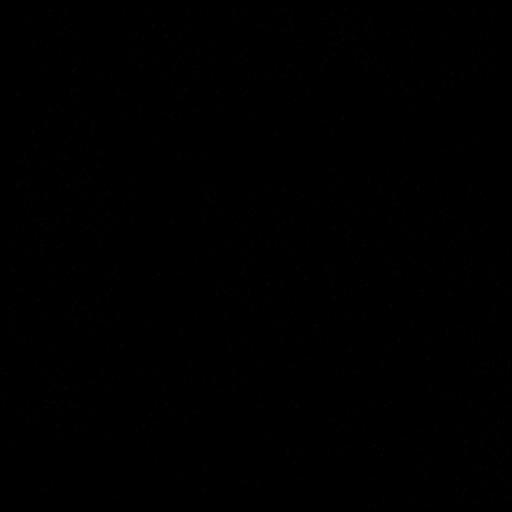
[im 7/34]
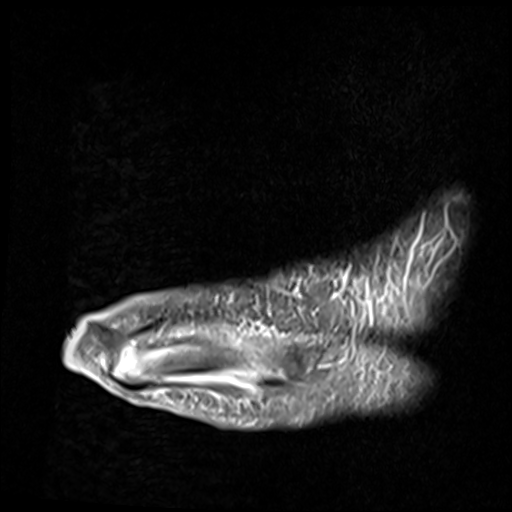
[im 14/34]
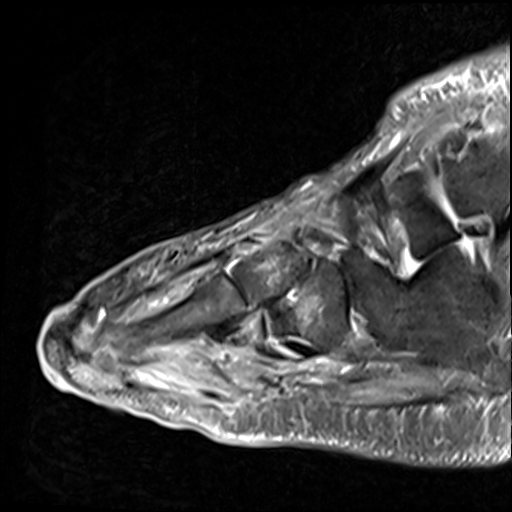
[im 20/34]
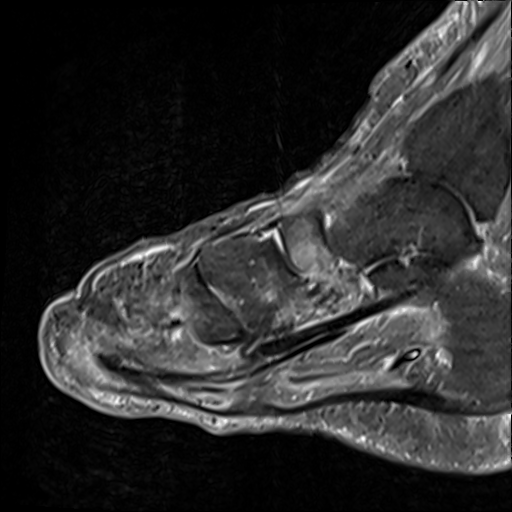
[im 27/34]
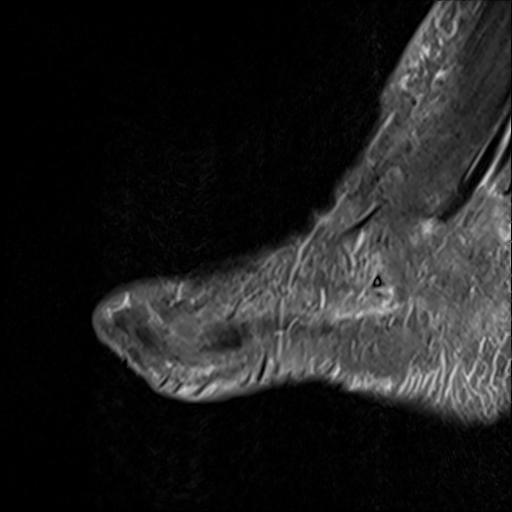
[im 34/34]
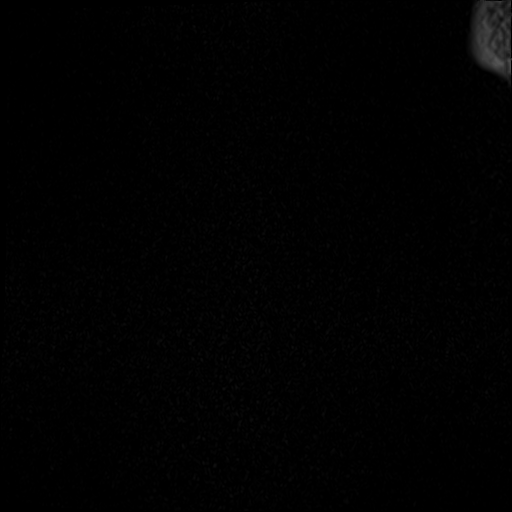

[40 of 40 positions shown; findings below may reference images not displayed]

FINDINGS: Bones/Joint/Cartilage

Prior transmetatarsal amputation. Similar abnormal marrow edema with
corresponding decreased T1 marrow signal involving the residual
first metatarsal. New similar bone marrow signal abnormality
involving the distal residual third and fifth metatarsals, as well
as the entire residual fourth metatarsal. Prominent subperiosteal
fluid along the fourth metatarsal shaft.

No fracture or dislocation. Progressive patchy marrow edema
involving the navicular, cuneiforms, and cuboid, with preserved T1
marrow signal, likely degenerative or stress related. No joint
effusion.

Ligaments

Lisfranc ligament is intact.

Muscles and Tendons
Postsurgical changes of the flexor and extensor tendons. Similar
severe fatty atrophy of the intrinsic foot muscles.

Soft tissue
Prominent skin thickening and soft tissue swelling of the amputation
stump. Unchanged plantar ulceration at the medial amputation stump
with sinus tract extending to the residual first metatarsal. New
plantar soft tissue ulceration along the lateral amputation stump
with sinus tract extending to the residual fourth metatarsal. New
1.4 x 0.9 x 0.9 cm fluid collection containing a focus of air in the
dorsal amputation stump between the residual third and fourth
metatarsals (series 7, image 17). There are several additional small
foci of subcutaneous air at the amputation stump.
IMPRESSION: 1. Prior transmetatarsal amputation. Similar osteomyelitis of the
residual first metatarsal. New osteomyelitis of the distal residual
third and fifth metatarsals, and entire residual fourth metatarsal.
2. New plantar soft tissue ulceration along the lateral amputation
stump with sinus tract extending to the residual fourth metatarsal.
New 1.4 cm abscess in the dorsal amputation stump.
3. Unchanged plantar soft tissue ulceration along the medial
amputation stump with sinus tract extending to the residual first
metatarsal.

## 2022-04-03 MED ORDER — PIPERACILLIN-TAZOBACTAM 3.375 G IVPB
3.3750 g | Freq: Three times a day (TID) | INTRAVENOUS | Status: DC
  Administered 2022-04-03 – 2022-04-08 (×15): 3.375 g via INTRAVENOUS
  Filled 2022-04-03 (×17): qty 50

## 2022-04-03 MED ORDER — SODIUM CHLORIDE 0.9 % IV SOLN: INTRAVENOUS | Status: DC

## 2022-04-03 NOTE — Progress Notes (Signed)
?Progress Note ? ? ?Patient: Ariel Arnold FVC:[id number] DOB: [birth year] DOA: 04/02/2022     1 ?DOS: the patient was seen and examined on 04/03/2022 ?  ?Brief hospital course: ?66 y.o. female with medical history significant of aortic atherosclerosis, peripheral vascular disease, type 2 diabetes on insulin, liver cirrhosis secondary to NASH, hypertension, thrombocytopenia, and depression/anxiety.  One week prior to admission, she was experiencing a trail of blood coming from her left foot.  She has history of osteomyelitis previously seen by Dr. Samuella Cota of the podiatry team.  He performed a transmetatarsal amputation leaving an open wound for drainage.  Chronic osteomyelitis continued to be an issue and she was lost to follow-up over the past 2 months.  She has a history of neuropathy where she cannot feel anything on her feet.  She denies pain, fevers, shortness of breath.  MRI performed on 02/14/22 showed likely persistent osteomyelitis. ? ?4/16 Podiatry seen and evaluated, recommending repeat MRI, antibiotics, possible surgery 4/17. To follow. Patient is clinically stable. If stable changes in wound, consider outpatient management.  ? ?Assessment and Plan: ?Chronic osteomyelitis involving ankle and foot, left (HCC) ?Zosyn ?MRI ordered ?Continue IVF ?Possible surgery 4/17 AM pending MRI per Podiatric Surgeon Dr. Allena Katz ?Pain control as needed ?Off loading is paramount ? ? ?Type 2 diabetes mellitus treated with insulin (HCC) ?A1c 11.2  ?Associated with DFU ?WORSENED FROM 9.4, lifestyle is mostly contributory to uncontrolled changes ?Hold home metformin, moderate sliding scale insulin ?            65 units of basal insulin nightly (normally on 65 bid) ?            6 units of mealtime insulin 3 times daily ?            Gabapentin 600 mg TID for neuropathy ?            Low carb diet until midnight ? ? ?Chronic anemia ?            Monitor, Ferrous sulfate ?  ?NASH ?            Monitor, LFT's wnl on admission ?   ?Thrombocytopenia ?            Monitor ?  ?Depression/anxiety ?            Continue Zoloft 200 mg daily; reports she is no longer on the 150 mg/d dosage ?  ?Hypertension ?            Continue spironolactone 50 mg daily prn swelling, Coreg 6.25 mg twice daily, will consider adding ACE/ARB ?            Cont Lasix 20 mg/d prn swelling ?  ?Hyperlipidemia ?            Continue pravastatin 20 mg daily ?  ?GERD ?            Continue Protonix 40 mg daily ?  ?  ?DVT prophylaxis: SCDs ?Code Status: Full ?Family Communication: None at bedside ?Disposition Plan: Pending progress, hopefully home ?Consults called: [facility], spoke with Dr. Allena Katz who will see the patient tomorrow morning ?Admission status: Inpatient ? ?  ? ?Subjective: NAEON. ?Understands care plan, podiatry following along. ?Does not have acute pain in foot, h/o neuropathy ? ?Physical Exam: ?Vitals:  ? 04/02/22 1030 04/02/22 1311 04/02/22 1737 04/03/22 0420  ?BP:  (!) 114/56 (!) 119/54 (!) 110/51  ?Pulse:  78 75 79  ?Resp:  16 16  20  ?Temp:  98.4 ?F (36.9 ?C) 98.3 ?F (36.8 ?C) 99 ?F (37.2 ?C)  ?TempSrc:  Oral  Oral  ?SpO2:  95% 98% 93%  ?Weight: 83.9 kg     ?Height: 5\' 8"  (1.727 m)     ? ?NAD, calm, comfortable, appears significantly older than stated age. ? ?Eyes: PERRL, lids and conjunctivae normal ?ENMT: Mucous membranes are moist.  ?Neck: normal, supple, no masses, no thyromegaly ?Respiratory: clear to auscultation bilaterally, no wheezing, no crackles. Normal respiratory effort. No accessory muscle use.  ?Cardiovascular: RRR. No extremity edema. 2+ pedal pulses. No carotid bruits.  ?Abdomen: no tenderness, no masses palpated. No hepatosplenomegaly. Bowel sounds positive.  ?Musculoskeletal: All 4 limbs present, left foot with a dressing present, no visible erythema; no tenderness to palpation, there is what appears to be of bloody and purulent discharge over the distal portion of the foot/dressing ?Skin: no rashes, lesions, ulcers  otherwise noted.  ?Neurologic: CN 2-12 grossly intact.  ?Psychiatric: Normal judgment and insight. Alert and oriented x 3. Normal mood.  ?  ?Data Reviewed: ? ?Results for orders placed or performed during the hospital encounter of 04/02/22 (from the past 24 hour(s))  ?Glucose, capillary     Status: Abnormal  ? Collection Time: 04/02/22  8:13 PM  ?Result Value Ref Range  ? Glucose-Capillary 214 (H) 70 - 99 mg/dL  ?CBC     Status: Abnormal  ? Collection Time: 04/03/22  5:33 AM  ?Result Value Ref Range  ? WBC 4.0 4.0 - 10.5 K/uL  ? RBC 2.81 (L) 3.87 - 5.11 MIL/uL  ? Hemoglobin 9.4 (L) 12.0 - 15.0 g/dL  ? HCT 27.6 (L) 36.0 - 46.0 %  ? MCV 98.2 80.0 - 100.0 fL  ? MCH 33.5 26.0 - 34.0 pg  ? MCHC 34.1 30.0 - 36.0 g/dL  ? RDW 14.8 11.5 - 15.5 %  ? Platelets 94 (L) 150 - 400 K/uL  ? nRBC 0.0 0.0 - 0.2 %  ?Basic metabolic panel     Status: Abnormal  ? Collection Time: 04/03/22  5:33 AM  ?Result Value Ref Range  ? Sodium 133 (L) 135 - 145 mmol/L  ? Potassium 3.7 3.5 - 5.1 mmol/L  ? Chloride 102 98 - 111 mmol/L  ? CO2 26 22 - 32 mmol/L  ? Glucose, Bld 189 (H) 70 - 99 mg/dL  ? BUN 20 8 - 23 mg/dL  ? Creatinine, Ser 0.88 0.44 - 1.00 mg/dL  ? Calcium 7.9 (L) 8.9 - 10.3 mg/dL  ? GFR, Estimated >60 >60 mL/min  ? Anion gap 5 5 - 15  ?Hepatic function panel     Status: Abnormal  ? Collection Time: 04/03/22  5:33 AM  ?Result Value Ref Range  ? Total Protein 6.0 (L) 6.5 - 8.1 g/dL  ? Albumin 2.0 (L) 3.5 - 5.0 g/dL  ? AST 24 15 - 41 U/L  ? ALT 13 0 - 44 U/L  ? Alkaline Phosphatase 85 38 - 126 U/L  ? Total Bilirubin 1.1 0.3 - 1.2 mg/dL  ? Bilirubin, Direct 0.3 (H) 0.0 - 0.2 mg/dL  ? Indirect Bilirubin 0.8 0.3 - 0.9 mg/dL  ?Hemoglobin A1c     Status: Abnormal  ? Collection Time: 04/03/22  5:33 AM  ?Result Value Ref Range  ? Hgb A1c MFr Bld 11.2 (H) 4.8 - 5.6 %  ? Mean Plasma Glucose 274.74 mg/dL  ?Lactic acid, plasma     Status: Abnormal  ? Collection Time: 04/03/22  5:33 AM  ?  Result Value Ref Range  ? Lactic Acid, Venous 2.1 (HH) 0.5 -  1.9 mmol/L  ? ? ?Family Communication: unavailable during rounds.  ? ?Disposition: ?Status is: Inpatient ?Remains inpatient appropriate because: Diabetic foot ulcer vs osteomyelitis, unclear etiology of wound ? Planned Discharge Destination: Home ? ? ? ?Time spent: 25 minutes ? ?Author: ?Thomas Hoff, MD ?04/03/2022 1:56 PM ? ?For on call review [email or web address].  ?

## 2022-04-03 NOTE — Progress Notes (Signed)
ABI's have been completed. ?Preliminary results can be found in CV Proc through chart review.  ? ?04/03/22 10:01 AM ?Ariel Arnold RVT   ?

## 2022-04-03 NOTE — H&P (View-Only) (Signed)
?  Subjective:  ?Patient ID: Ariel Arnold, female    DOB: 1956-11-07,  MRN: [id number] ? ?A 66 y.o. female presents with aortic atherosclerosis, peripheral vascular disease, type 2 diabetes on insulin, liver cirrhosis secondary to NASH, hypertension, thrombocytopenia, and depression/anxiety with left transmetatarsal amputation stump wound. There is maldor presents with wound. She was transferred from [facility] to be further evaluated.  ?Objective:  ? ?Vitals:  ? 04/02/22 1737 04/03/22 0420  ?BP: (!) 119/54 (!) 110/51  ?Pulse: 75 79  ?Resp: 16 20  ?Temp: 98.3 ?F (36.8 ?C) 99 ?F (37.2 ?C)  ?SpO2: 98% 93%  ? ?General AA&O x3. Normal mood and affect.  ?Vascular Dorsalis pedis and posterior tibial pulsesnon palpable bilat. ?Brisk capillary diminished to all digits. Pedal hair not present.  ?Neurologic Epicritic sensation grossly intact.  ?Dermatologic Left plantar foot wound with history of TMA. Malodor presents no purulent drainage noted. No redness noted.   ?Orthopedic: MMT 5/5 in dorsiflexion, plantarflexion, inversion, and eversion. ?Normal joint ROM without pain or crepitus.  ? ? ? ? ? ?Assessment & Plan:  ?Patient was evaluated and treated and all questions answered. ? ?Left foot wound with history of transmetatarsal amputation ?-All questions and concerns were discussed with the patient in extensive detail ?-Clinically there appears to be some malodor that is present however no other clinical signs of infection.  I would await MRI. ?-If there is further osteomyelitis patient will need a surgical amputation of the foot at the level of the midfoot. ?-I discussed this case with Dr. Blenda Mounts who will be taking over the call ?-At the moment we are awaiting MRI to assess for osteomyelitis. ?-Vascular studies appears to be within normal limits however if there is no intraoral bleeding patient will benefit from vascular work-up as well. ?-Nonweightbearing to the left lower extremity ?-Betadine wet-to-dry  dressing ? ?Felipa Furnace, DPM ? ?Accessible via secure chat for questions or concerns. ? ?

## 2022-04-03 NOTE — Progress Notes (Signed)
Pt has returned from MRI to Room 1513. Alert and oriented.  ?

## 2022-04-03 NOTE — Assessment & Plan Note (Addendum)
A1c 11.2  ?Associated with DFU ?WORSENED FROM 9.4, lifestyle is mostly contributory to uncontrolled changes ?Hold home metformin, moderate sliding scale insulin ?            65 units of basal insulin nightly (normally on 65 bid) ?            6 units of mealtime insulin 3 times daily ?            Gabapentin 600 mg TID for neuropathy ?            Low carb diet until midnight ?

## 2022-04-03 NOTE — Assessment & Plan Note (Signed)
Zosyn ?MRI ordered ?Continue IVF ?Possible surgery 4/17 AM pending MRI per Podiatric Surgeon Dr. Posey Pronto ?Pain control as needed ?Off loading is paramount ? ?

## 2022-04-03 NOTE — Hospital Course (Signed)
66 y.o. female with medical history significant of aortic atherosclerosis, peripheral vascular disease, type 2 diabetes on insulin, liver cirrhosis secondary to NASH, hypertension, thrombocytopenia, and depression/anxiety.  One week prior to admission, she was experiencing a trail of blood coming from her left foot.  She has history of osteomyelitis previously seen by Dr. March Rummage of the podiatry team.  He performed a transmetatarsal amputation leaving an open wound for drainage.  Chronic osteomyelitis continued to be an issue and she was lost to follow-up over the past 2 months.  She has a history of neuropathy where she cannot feel anything on her feet.  She denies pain, fevers, shortness of breath.  MRI performed on 02/14/22 showed likely persistent osteomyelitis. ? ?4/16 Podiatry seen and evaluated, recommending repeat MRI, antibiotics, possible surgery 4/17. To follow. Patient is clinically stable. If stable changes in wound, consider outpatient management.  ?

## 2022-04-03 NOTE — Consult Note (Addendum)
?  Subjective:  ?Patient ID: Ariel Arnold, female    DOB: 1956-10-05,  MRN: [id number] ? ?A 66 y.o. female presents with aortic atherosclerosis, peripheral vascular disease, type 2 diabetes on insulin, liver cirrhosis secondary to NASH, hypertension, thrombocytopenia, and depression/anxiety with left transmetatarsal amputation stump wound. There is maldor presents with wound. She was transferred from [facility] to be further evaluated.  ?Objective:  ? ?Vitals:  ? 04/02/22 1737 04/03/22 0420  ?BP: (!) 119/54 (!) 110/51  ?Pulse: 75 79  ?Resp: 16 20  ?Temp: 98.3 ?F (36.8 ?C) 99 ?F (37.2 ?C)  ?SpO2: 98% 93%  ? ?General AA&O x3. Normal mood and affect.  ?Vascular Dorsalis pedis and posterior tibial pulsesnon palpable bilat. ?Brisk capillary diminished to all digits. Pedal hair not present.  ?Neurologic Epicritic sensation grossly intact.  ?Dermatologic Left plantar foot wound with history of TMA. Malodor presents no purulent drainage noted. No redness noted.   ?Orthopedic: MMT 5/5 in dorsiflexion, plantarflexion, inversion, and eversion. ?Normal joint ROM without pain or crepitus.  ? ? ? ? ? ?Assessment & Plan:  ?Patient was evaluated and treated and all questions answered. ? ?Left foot wound with history of transmetatarsal amputation ?-All questions and concerns were discussed with the patient in extensive detail ?-Clinically there appears to be some malodor that is present however no other clinical signs of infection.  I would await MRI. ?-If there is further osteomyelitis patient will need a surgical amputation of the foot at the level of the midfoot. ?-I discussed this case with Dr. Blenda Mounts who will be taking over the call ?-At the moment we are awaiting MRI to assess for osteomyelitis. ?-Vascular studies appears to be within normal limits however if there is no intraoral bleeding patient will benefit from vascular work-up as well. ?-Nonweightbearing to the left lower extremity ?-Betadine wet-to-dry  dressing ? ?Felipa Furnace, DPM ? ?Accessible via secure chat for questions or concerns. ? ?

## 2022-04-03 NOTE — Progress Notes (Signed)
Have reported to MD critical lactic acid of 2.1 ?Received response. ? ?Pt alert and oriented. ? ?

## 2022-04-04 ENCOUNTER — Inpatient Hospital Stay (HOSPITAL_COMMUNITY): Payer: Medicare Other

## 2022-04-04 DIAGNOSIS — I10 Essential (primary) hypertension: Secondary | ICD-10-CM

## 2022-04-04 DIAGNOSIS — K7460 Unspecified cirrhosis of liver: Secondary | ICD-10-CM

## 2022-04-04 DIAGNOSIS — E872 Acidosis, unspecified: Secondary | ICD-10-CM | POA: Diagnosis not present

## 2022-04-04 DIAGNOSIS — D696 Thrombocytopenia, unspecified: Secondary | ICD-10-CM

## 2022-04-04 DIAGNOSIS — K7581 Nonalcoholic steatohepatitis (NASH): Secondary | ICD-10-CM

## 2022-04-04 DIAGNOSIS — M86672 Other chronic osteomyelitis, left ankle and foot: Secondary | ICD-10-CM | POA: Diagnosis not present

## 2022-04-04 DIAGNOSIS — E782 Mixed hyperlipidemia: Secondary | ICD-10-CM

## 2022-04-04 DIAGNOSIS — L03116 Cellulitis of left lower limb: Secondary | ICD-10-CM | POA: Diagnosis not present

## 2022-04-04 LAB — BASIC METABOLIC PANEL
Anion gap: 5 (ref 5–15)
BUN: 19 mg/dL (ref 8–23)
CO2: 25 mmol/L (ref 22–32)
Calcium: 7.8 mg/dL — ABNORMAL LOW (ref 8.9–10.3)
Chloride: 105 mmol/L (ref 98–111)
Creatinine, Ser: 0.96 mg/dL (ref 0.44–1.00)
GFR, Estimated: >60 mL/min (ref >60)
Glucose, Bld: 402 mg/dL — ABNORMAL HIGH (ref 70–99)
Potassium: 3.4 mmol/L — ABNORMAL LOW (ref 3.5–5.1)
Sodium: 135 mmol/L (ref 135–145)

## 2022-04-04 LAB — GLUCOSE, CAPILLARY
Glucose-Capillary: 316 mg/dL — ABNORMAL HIGH (ref 70–99)
Glucose-Capillary: 164 mg/dL — ABNORMAL HIGH (ref 70–99)
Glucose-Capillary: 126 mg/dL — ABNORMAL HIGH (ref 70–99)
Glucose-Capillary: 238 mg/dL — ABNORMAL HIGH (ref 70–99)
Glucose-Capillary: 318 mg/dL — ABNORMAL HIGH (ref 70–99)
Glucose-Capillary: 135 mg/dL — ABNORMAL HIGH (ref 70–99)
Glucose-Capillary: 156 mg/dL — ABNORMAL HIGH (ref 70–99)
Glucose-Capillary: 141 mg/dL — ABNORMAL HIGH (ref 70–99)

## 2022-04-04 LAB — CBC
HCT: 27.3 % — ABNORMAL LOW (ref 36.0–46.0)
Hemoglobin: 8.9 g/dL — ABNORMAL LOW (ref 12.0–15.0)
MCH: 32.4 pg (ref 26.0–34.0)
MCHC: 32.6 g/dL (ref 30.0–36.0)
MCV: 99.3 fL (ref 80.0–100.0)
Platelets: 82 10*3/uL — ABNORMAL LOW (ref 150–400)
RBC: 2.75 MIL/uL — ABNORMAL LOW (ref 3.87–5.11)
RDW: 15 % (ref 11.5–15.5)
WBC: 2.8 10*3/uL — ABNORMAL LOW (ref 4.0–10.5)
nRBC: 0 % (ref 0.0–0.2)

## 2022-04-04 LAB — HEPATIC FUNCTION PANEL
ALT: 14 U/L (ref 0–44)
AST: 25 U/L (ref 15–41)
Albumin: 2 g/dL — ABNORMAL LOW (ref 3.5–5.0)
Alkaline Phosphatase: 87 U/L (ref 38–126)
Bilirubin, Direct: 0.3 mg/dL — ABNORMAL HIGH (ref 0.0–0.2)
Indirect Bilirubin: 0.6 mg/dL (ref 0.3–0.9)
Total Bilirubin: 0.9 mg/dL (ref 0.3–1.2)
Total Protein: 6 g/dL — ABNORMAL LOW (ref 6.5–8.1)

## 2022-04-04 IMAGING — US US ABDOMEN LIMITED
1 series · 15 of 25 positions shown · non-contrast
Comparison: CT [DATE]

CLINICAL DATA: Abdominal distension

EXAM:
LIMITED ABDOMEN ULTRASOUND FOR ASCITES
TECHNIQUE: Limited ultrasound survey for ascites was performed in all four
abdominal quadrants.

[Series 1: us abdomen limited mc & wl · 15 of 25 slices shown]
[im 1/25]
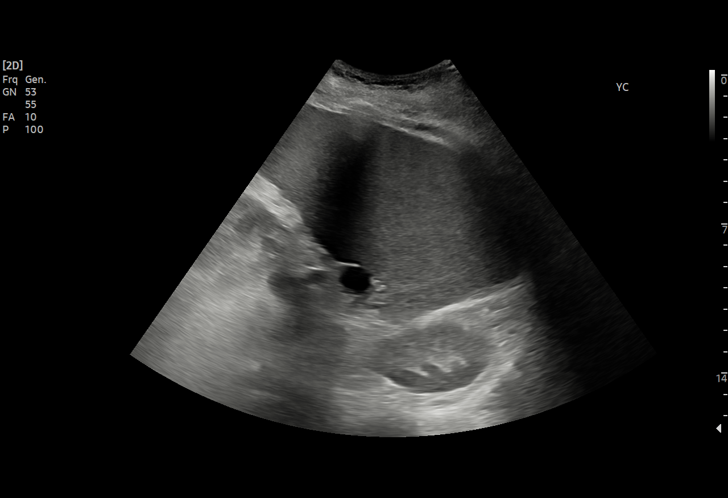
[im 3/25]
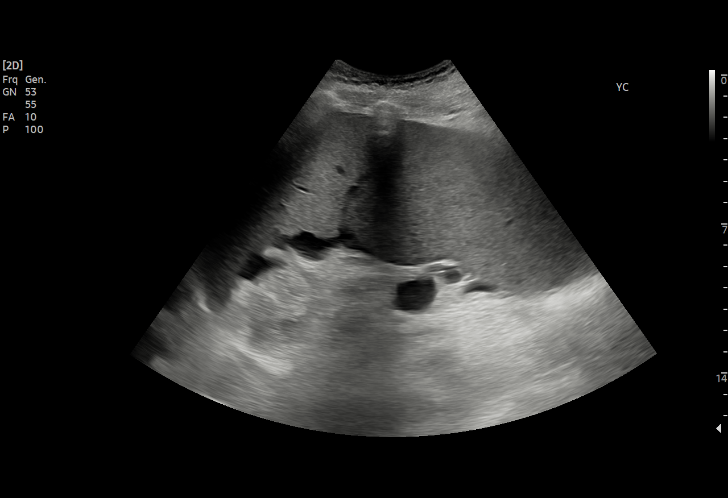
[im 5/25]
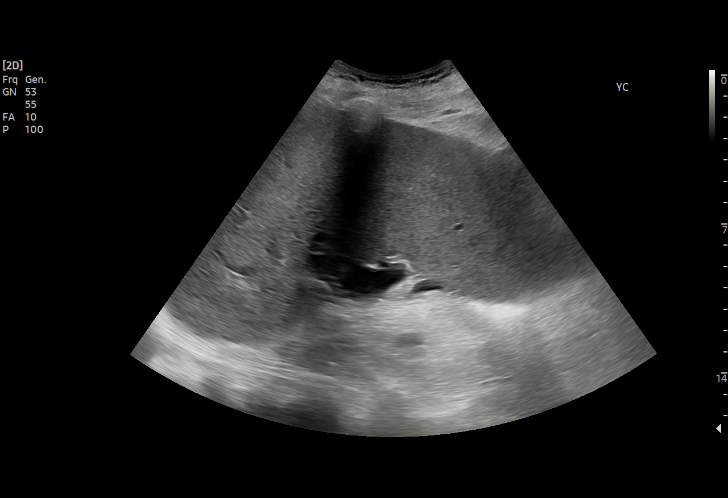
[im 6/25]
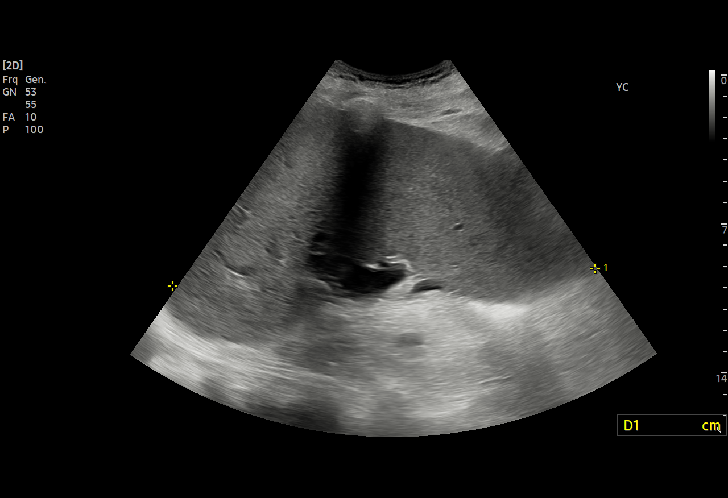
[im 8/25]
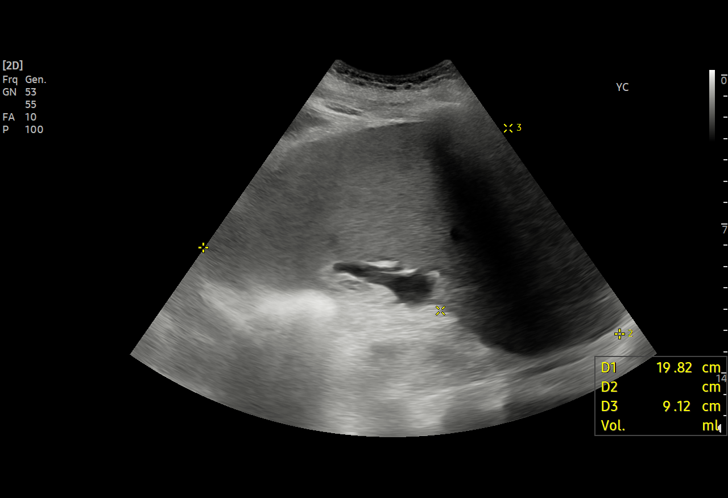
[im 10/25]
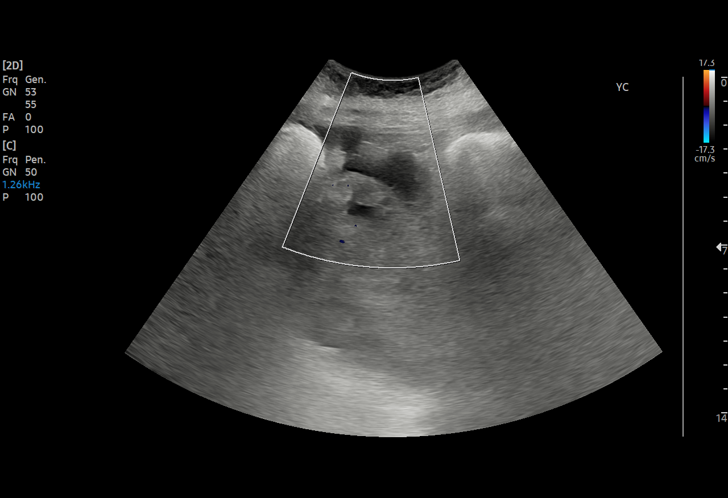
[im 11/25]
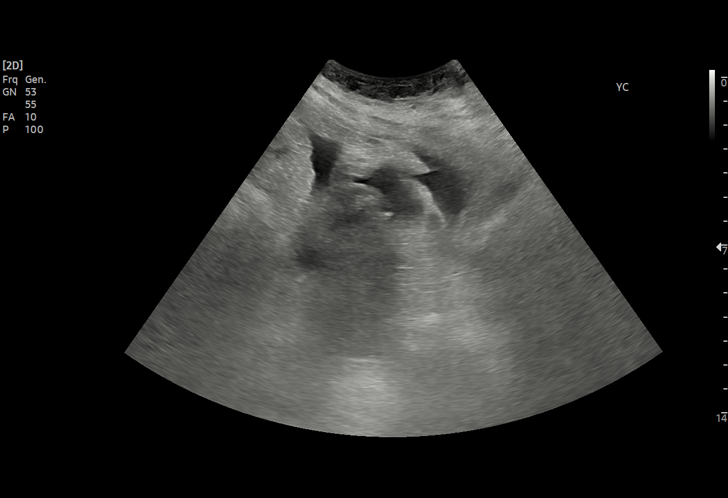
[im 13/25]
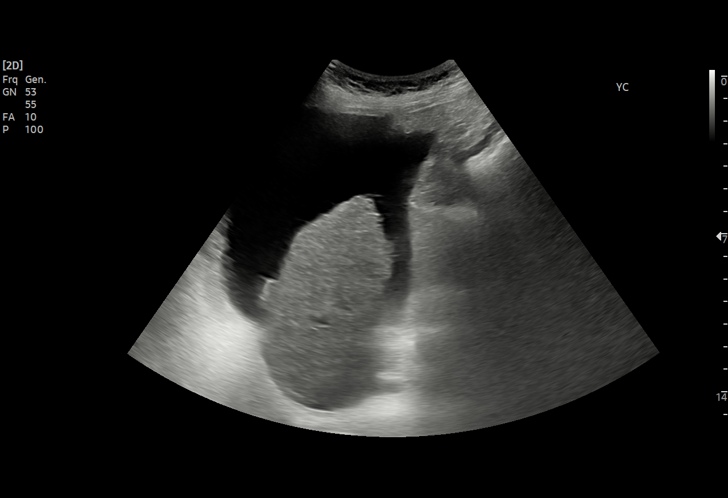
[im 15/25]
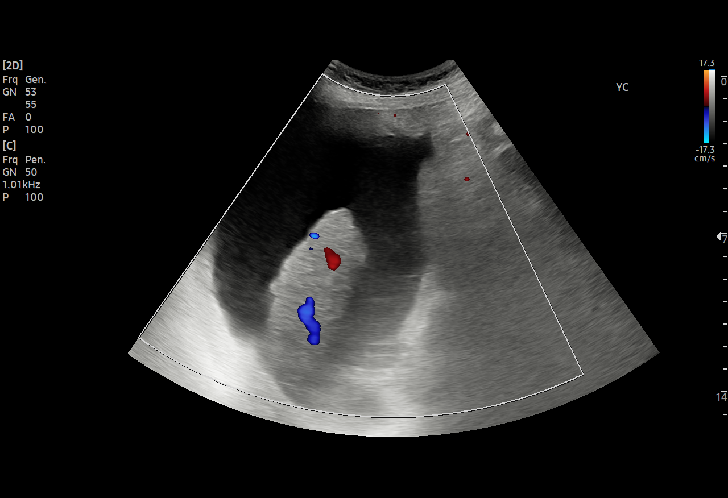
[im 16/25]
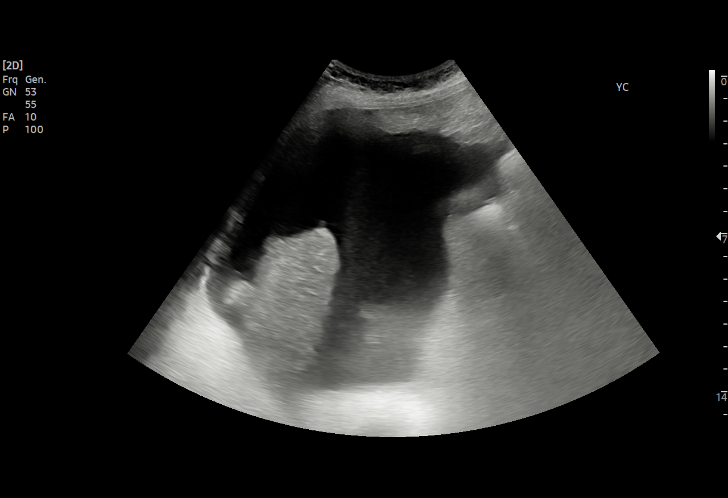
[im 18/25]
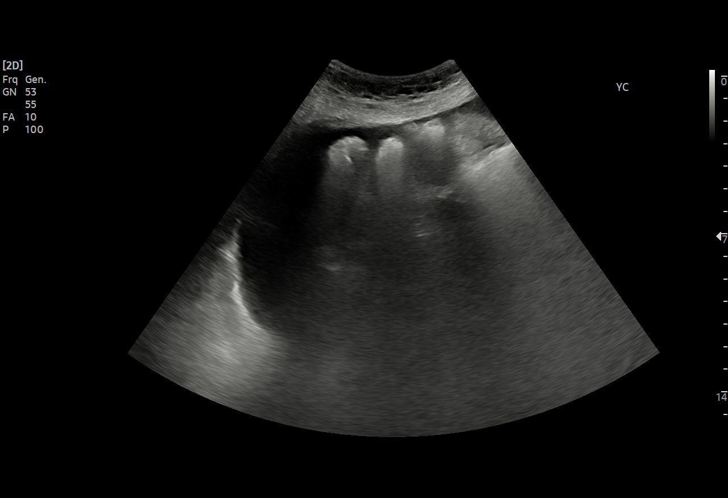
[im 20/25]
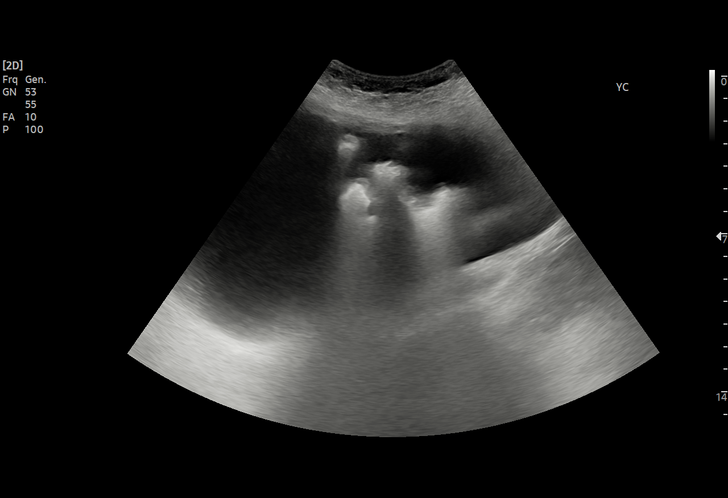
[im 21/25]
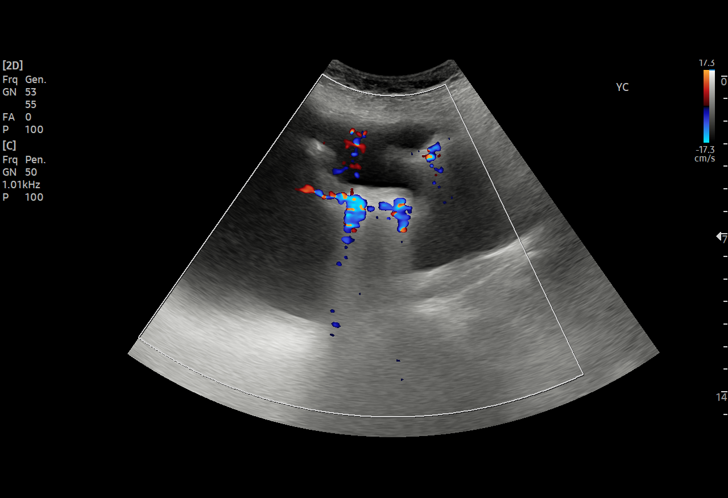
[im 23/25]
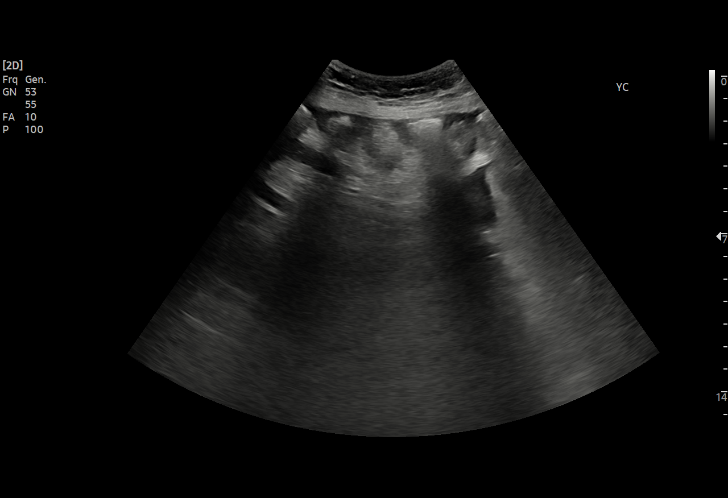
[im 25/25]
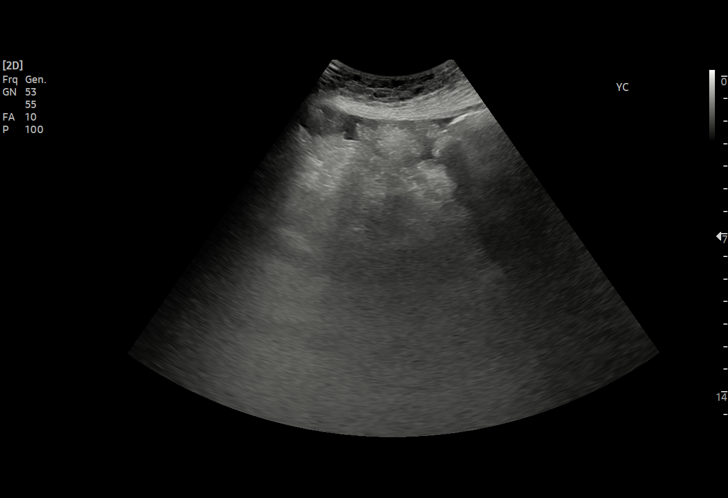

[15 of 25 positions shown; findings below may reference images not displayed]

FINDINGS: Survey images of the 4 abdominal quadrants demonstrates moderate
volume of ascites, largest pocket seen within the right upper
quadrant. Cirrhotic morphology of the visualized liver. Splenomegaly
with calculated volume of approximately [22] mL.
IMPRESSION: 1. Moderate volume ascites.
2. Splenomegaly and hepatic cirrhosis.

## 2022-04-04 MED ORDER — LIP MEDEX EX OINT
TOPICAL_OINTMENT | CUTANEOUS | Status: DC | PRN
  Filled 2022-04-04: qty 7

## 2022-04-04 NOTE — Progress Notes (Signed)
?  Transition of Care (TOC) Screening Note ? ? ?Patient Details  ?Name: Ariel Arnold ?Date of Birth: Jan 26, 1956 ? ? ?Transition of Care (TOC) CM/SW Contact:    ?Jemiah Ellenburg, LCSW ?Phone Number: ?04/04/2022, 3:45 PM ? ? ? ?Transition of Care Department [facility]) has reviewed patient and no TOC needs have been identified at this time. We will continue to monitor patient advancement through interdisciplinary progression rounds. If new patient transition needs arise, please place a TOC consult. ? ? ?

## 2022-04-04 NOTE — Progress Notes (Signed)
? ?PROGRESS NOTE ? ? ? ?Ariel Arnold  [id number] DOB: [birth year] DOA: 04/02/2022 ?PCP: Jacqualine Code, DO ? ? ?Brief Narrative: ?66 y.o. female with medical history significant of aortic atherosclerosis, peripheral vascular disease, type 2 diabetes on insulin, liver cirrhosis secondary to NASH, hypertension, thrombocytopenia, and depression/anxiety.  One week prior to admission, she was experiencing a trail of blood coming from her left foot.  She has history of osteomyelitis previously seen by Dr. March Rummage of the podiatry team.  He performed a transmetatarsal amputation leaving an open wound for drainage.  Chronic osteomyelitis continued to be an issue and she was lost to follow-up over the past 2 months.  She has a history of neuropathy where she cannot feel anything on her feet.  She denies pain, fevers, shortness of breath.  MRI performed on 02/14/22 showed likely persistent osteomyelitis. ? ?4/16 Podiatry seen and evaluated, recommending repeat MRI, antibiotics, possible surgery 4/17. To follow. Patient is clinically stable. If stable changes in wound, consider outpatient management.  ? ? ?Assessment and Plan: ? ?Chronic osteomyelitis ?Acute osteomyelitis ?MRI on 4/16 significant for evidence of acute osteomyelitis of distal third and fifth metatarsals, in addition to entire residual fourth metatarsal. ?-Continue Zosyn since already started ?-Obtain blood cultures ?-Hold off on starting Vancomycin since patient is currently stable and awaiting likely surgical management ? ?Foot abscess ?Noted on MRI of left foot. On Zosyn as mentioned above. Currently stable. ?-Continue Zosyn ?-Podiatry recommendations: pending today ? ?Diabetes mellitus, type 2 ?Uncontrolled with hyperglycemia. Hemoglobin A1C of 11.2%. patient is on Humalog, Toujeo as an outpatient.Started on [place]o and SSI while inpatient ?-Continue Semglee ?-Continue SSI ? ?Chronic anemia ?Stable. No current evidence of bleeding. ?-Anemia  panel ?-Continue iron supplementation ? ?NASH ?Noted. Stable. History of ascites and portal hypertension. ? ?Abdominal distension ?Asymptomatic but noticeable. Patient having bowel movements. History of ascites ?-abdominal ultrasound to check for ascites ? ?Thrombocytopenia ?Chronic and stable. ? ?Depression ?Anxiety ?-Continue Zoloft ? ?Primary hypertension ?-Continue Coreg ? ?Hyperlipidemia ?-Continue Pravastatin ? ?GERD ?-Continue Protonix prn ? ? ?DVT prophylaxis: SCDs ?Code Status:   Code Status: Full Code ?Family Communication: Daughter at bedside ?Disposition Plan: Discharge pending podiatry recommendations/management, outpatient antibiotic regimen if needed ? ? ?Consultants:  ?Podiatry ? ?Procedures:  ?None ? ?Antimicrobials: ?Zosyn IV  ? ? ?Subjective: ?Patient reports being thirsty. No other concerns ? ?Objective: ?BP (!) 116/92 (BP Location: Right Arm)   Pulse 71   Temp 98 ?F (36.7 ?C) (Oral)   Resp (!) 22   Ht 5\' 8"  (1.727 m)   Wt 83.9 kg   SpO2 100%   BMI 28.13 kg/m?  ? ?Examination: ? ?General exam: Appears calm and comfortable ?Respiratory system: Clear to auscultation. Respiratory effort normal. ?Cardiovascular system: S1 & S2 heard, Normal rate with regular rhythm. ?Gastrointestinal system: Abdomen is distended, soft and nontender. Normal bowel sounds heard. ?Central nervous system: Alert and oriented. No focal neurological deficits. ?Musculoskeletal: Left foot in ACE bandage wrap. No calf tenderness ?Skin: No cyanosis. ?Psychiatry: Judgement and insight appear normal. Mood & affect appropriate.  ? ? ?Data Reviewed: I have personally reviewed following labs and imaging studies ? ?CBC ?Lab Results  ?Component Value Date  ? WBC 2.8 (L) 04/04/2022  ? RBC 2.75 (L) 04/04/2022  ? HGB 8.9 (L) 04/04/2022  ? HCT 27.3 (L) 04/04/2022  ? MCV 99.3 04/04/2022  ? MCH 32.4 04/04/2022  ? PLT 82 (L) 04/04/2022  ? MCHC 32.6 04/04/2022  ? RDW 15.0 04/04/2022  ? LYMPHSABS 1,021  02/07/2022  ? MONOABS 0.1  11/09/2021  ? EOSABS 202 02/07/2022  ? BASOSABS 10 02/07/2022  ? ? ? ?Last metabolic panel ?Lab Results  ?Component Value Date  ? NA 135 04/04/2022  ? K 3.4 (L) 04/04/2022  ? CL 105 04/04/2022  ? CO2 25 04/04/2022  ? BUN 19 04/04/2022  ? CREATININE 0.96 04/04/2022  ? GLUCOSE 402 (H) 04/04/2022  ? GFRNONAA >60 04/04/2022  ? CALCIUM 7.8 (L) 04/04/2022  ? PHOS 1.9 (L) 05/26/2021  ? PROT 6.0 (L) 04/04/2022  ? ALBUMIN 2.0 (L) 04/04/2022  ? BILITOT 0.9 04/04/2022  ? ALKPHOS 87 04/04/2022  ? AST 25 04/04/2022  ? ALT 14 04/04/2022  ? ANIONGAP 5 04/04/2022  ? ? ?GFR: ?Estimated Creatinine Clearance: 64.5 mL/min (by C-G formula based on SCr of 0.96 mg/dL). ? ?No results found for this or any previous visit (from the past 240 hour(s)).  ? ? ?Radiology Studies: ?MR FOOT LEFT WO CONTRAST ? ?Result Date: 04/03/2022 ?CLINICAL DATA:  Left foot wound. EXAM: MRI OF THE LEFT FOOT WITHOUT CONTRAST TECHNIQUE: Multiplanar, multisequence MR imaging of the left forefoot was performed. No intravenous contrast was administered. COMPARISON:  Left foot x-rays dated April 01, 2022. MRI left foot dated February 14, 2022. FINDINGS: Bones/Joint/Cartilage Prior transmetatarsal amputation. Similar abnormal marrow edema with corresponding decreased T1 marrow signal involving the residual first metatarsal. New similar bone marrow signal abnormality involving the distal residual third and fifth metatarsals, as well as the entire residual fourth metatarsal. Prominent subperiosteal fluid along the fourth metatarsal shaft. No fracture or dislocation. Progressive patchy marrow edema involving the navicular, cuneiforms, and cuboid, with preserved T1 marrow signal, likely degenerative or stress related. No joint effusion. Ligaments Lisfranc ligament is intact. Muscles and Tendons Postsurgical changes of the flexor and extensor tendons. Similar severe fatty atrophy of the intrinsic foot muscles. Soft tissue Prominent skin thickening and soft tissue swelling of  the amputation stump. Unchanged plantar ulceration at the medial amputation stump with sinus tract extending to the residual first metatarsal. New plantar soft tissue ulceration along the lateral amputation stump with sinus tract extending to the residual fourth metatarsal. New 1.4 x 0.9 x 0.9 cm fluid collection containing a focus of air in the dorsal amputation stump between the residual third and fourth metatarsals (series 7, image 17). There are several additional small foci of subcutaneous air at the amputation stump. IMPRESSION: 1. Prior transmetatarsal amputation. Similar osteomyelitis of the residual first metatarsal. New osteomyelitis of the distal residual third and fifth metatarsals, and entire residual fourth metatarsal. 2. New plantar soft tissue ulceration along the lateral amputation stump with sinus tract extending to the residual fourth metatarsal. New 1.4 cm abscess in the dorsal amputation stump. 3. Unchanged plantar soft tissue ulceration along the medial amputation stump with sinus tract extending to the residual first metatarsal. Electronically Signed   By: Titus Dubin M.D.   On: 04/03/2022 17:53  ? ?VAS [place] ABI WITH/WO TBI ? ?Result Date: 04/03/2022 ? LOWER EXTREMITY DOPPLER STUDY Patient Name:  MENDIE MCBETH  Date of Exam:   04/03/2022 Medical Rec #: [id number]        Accession #:    [id number] Date of Birth: 1956/01/28         Patient Gender: F Patient Age:   33 years Exam Location:  [facility] Procedure:      VAS [place] ABI WITH/WO TBI Referring Phys: Lennette Bihari PATEL --------------------------------------------------------------------------------  Indications: Ulceration. High Risk Factors: Hypertension, hyperlipidemia, Diabetes. Other Factors: 11/12/2021 -  LEFT DEBRIDEMENT WOUND;FIRST METATARSAL RESECTION;                APPLICATION OF SKIN GRAFT SUBSTITUTE.  Limitations: Today's exam was limited due to bandages , an open wound and Right              partial great toe amputation.  Comparison Study: No prior studies. Performing Technologist: Carlos Levering RVT  Examination Guidelines: A complete evaluation includes at minimum, Doppler waveform signals and systolic blood pressure reading at the level of bilateral

## 2022-04-04 NOTE — Progress Notes (Signed)
?Subjective:  ?Patient ID: Ariel Arnold, female    DOB: 1956/08/24,  MRN: [id number] ? ?Patient with past medical history of aortic atherosclerosis, PVD, DM type 2, liver cirrhosis, HTN, thrombocytopenia, and depression.  seen at beside today for left transmetatarsal stump wound with worsening osteomyelitis. Patient relates doing well and denies much pain. .  ? ?Past Medical History:  ?Diagnosis Date  ? Anemia   ? Anxiety   ? Aortic atherosclerosis ([place])   ? Arthritis   ? AVM (arteriovenous malformation) of colon   ? Depression   ? Essential hypertension   ? GI bleeding   ? Recurrent  ? Hepatic encephalopathy ([place])   ? History of kidney stones   ? History of RSV infection   ? Liver cirrhosis secondary to NASH [facility]) 2004  ? Biopsy-proven  ? Obesity   ? Osteomyelitis ([place])   ? Peripheral vascular disease ([place])   ? Thrombocytopenia ([place])   ? Type 2 diabetes mellitus ([place])   ?  ? ?Past Surgical History:  ?Procedure Laterality Date  ? ABDOMINAL HYSTERECTOMY    ? COLONOSCOPY    ? Cystoscopy with ureteral stent    ? ESOPHAGOGASTRODUODENOSCOPY    ? ESOPHAGOGASTRODUODENOSCOPY (EGD) WITH PROPOFOL N/A 05/20/2021  ? Procedure: ESOPHAGOGASTRODUODENOSCOPY (EGD) WITH PROPOFOL;  Surgeon: Daneil Dolin, MD;  [facility];  Service: Endoscopy;  Laterality: N/A;  needs intubation  ? IR ANGIOGRAM FOLLOW UP STUDY  05/21/2021  ? IR ANGIOGRAM SELECTIVE EACH ADDITIONAL VESSEL  05/21/2021  ? IR EMBO VENOUS NOT HEMORR HEMANG  INC GUIDE ROADMAPPING  05/21/2021  ? IR US GUIDE VASC ACCESS RIGHT  05/21/2021  ? IR VENOGRAM RENAL UNI LEFT  05/21/2021  ? RADIOLOGY WITH ANESTHESIA N/A 05/21/2021  ? Procedure: IR WITH ANESTHESIA;  Surgeon: Radiologist, Medication, MD;  Location: [place];  Service: Radiology;  Laterality: N/A;  ? Toe amputations Bilateral   ? TUBAL LIGATION    ? WOUND DEBRIDEMENT Left 11/12/2021  ? Procedure: DEBRIDEMENT WOUND;FIRST METATARSAL RESECTION; APPLICATION OF SKIN GRAFT SUBSTITUTE;  Surgeon: Evelina Bucy, DPM;  Location:  [facility];  Service: Podiatry;  Laterality: Left;  ? ? ? ?  Latest Ref Rng & Units 04/04/2022  ?  5:22 AM 04/03/2022  ?  5:33 AM 02/07/2022  ?  1:59 PM  ?CBC  ?WBC 4.0 - 10.5 K/uL 2.8   4.0   3.2    ?Hemoglobin 12.0 - 15.0 g/dL 8.9   9.4   11.7    ?Hematocrit 36.0 - 46.0 % 27.3   27.6   36.1    ?Platelets 150 - 400 K/uL 82   94   62    ? ? ? ?  Latest Ref Rng & Units 04/04/2022  ?  5:22 AM 04/03/2022  ?  5:33 AM 11/17/2021  ?  5:11 AM  ?BMP  ?Glucose 70 - 99 mg/dL 402   189   113    ?BUN 8 - 23 mg/dL 19   20   31     ?Creatinine 0.44 - 1.00 mg/dL 0.96   0.88   0.69    ?Sodium 135 - 145 mmol/L 135   133   135    ?Potassium 3.5 - 5.1 mmol/L 3.4   3.7   4.5    ?Chloride 98 - 111 mmol/L 105   102   110    ?CO2 22 - 32 mmol/L 25   26   22     ?Calcium 8.9 - 10.3 mg/dL 7.8  7.9   8.5    ? ? ? ?Objective:  ? ?Vitals:  ? 04/04/22 0502 04/04/22 1406  ?BP: (!) 116/55 (!) 116/92  ?Pulse: 87 71  ?Resp: 16 (!) 22  ?Temp: 98.9 ?F (37.2 ?C) 98 ?F (36.7 ?C)  ?SpO2: 95% 100%  ? ? ?General:AA&O x 3. Normal mood and affect  ? ?Vascular: DP and PT pulses 2/4 bilateral. Brisk capillary refill to all digits. Pedal hair present  ? ?Neruological. Epicritic sensation grossly intact.  ? ?Derm: Left plantar foot wound overlying previous left TMA stump. Maolodor and drainage present. No erythema or edema.  ? ?MSK: MMT 5/5 in dorsiflexion, plantar flexion, inversion and eversion. Normal joint ROM without pain or crepitus.  ? ? ?Assessment & Plan:  ?Patient was evaluated and treated and all questions answered. ? ?DX: Left foot wound/osteomyelitis  ?Wound care: betadine, DSD  ?Antibiotics: Per primary  ?DME: Post-op shoe   ?Discussed with patient diagnosis and treatment options.  ?Imaging reviewed. MRI reviewed which shoes new osteomyelitis in first 3rd and 5th metatarsals distally and throughout fourth metatarsal.  ?Discussed with patient options. At this time will plan for revision of the amputation site and hopefully closure of wound.   ?Patient in  agreement with plan and all questions answered.  ?Will plan for surgery tomorrow afternoon.  ?Patient to be npo after midnight tonight.  ? ?Lorenda Peck, MD ? ?Accessible via secure chat for questions or concerns. ? ?

## 2022-04-04 NOTE — Progress Notes (Signed)
Inpatient Diabetes Program Recommendations ? ?AACE/ADA: New Consensus Statement on Inpatient Glycemic Control (2015) ? ?Target Ranges:  Prepandial:   less than 140 mg/dL ?     Peak postprandial:   less than 180 mg/dL (1-2 hours) ?     Critically ill patients:  140 - 180 mg/dL  ? ?Lab Results  ?Component Value Date  ? GLUCAP 296 (H) 04/03/2022  ? HGBA1C 11.2 (H) 04/03/2022  ? ? ?Review of Glycemic Control ? Latest Reference Range & Units 04/02/22 15:27 04/02/22 20:13 04/03/22 07:36 04/03/22 11:24 04/03/22 22:19  ?Glucose-Capillary 70 - 99 mg/dL 235 (H) 361 (H) 443 (H) 126 (H) 296 (H)  ? ?Diabetes history: DM 2 ?Outpatient Diabetes medications:  ?Humalog 6 units tid with meals ?Metformin 500 mg bid ?Toujeo 68 units q HS ?Current orders for Inpatient glycemic control:  ?Novolog 0-15 units tid with meals and HS ?Novolog 4 units tid with meals ?Semglee 65 units q HS ?Inpatient Diabetes Program Recommendations:   ? ?Agree with current orders.  A1C=11.2%.   ?Will follow.  ? ?Thanks,  ?Beryl Meager, RN, BC-ADM ?Inpatient Diabetes Coordinator ?Pager [phone]  (8a-5p) ? ? ?

## 2022-04-04 NOTE — Progress Notes (Incomplete)
Inpatient Diabetes Program Recommendations ? ?AACE/ADA: New Consensus Statement on Inpatient Glycemic Control (2015) ? ?Target Ranges:  Prepandial:   less than 140 mg/dL ?     Peak postprandial:   less than 180 mg/dL (1-2 hours) ?     Critically ill patients:  140 - 180 mg/dL  ? ?Lab Results  ?Component Value Date  ? GLUCAP 296 (H) 04/03/2022  ? HGBA1C 11.2 (H) 04/03/2022  ? ? ?Review of Glycemic Control ? Latest Reference Range & Units 04/02/22 15:27 04/02/22 20:13 04/03/22 07:36 04/03/22 11:24 04/03/22 22:19  ?Glucose-Capillary 70 - 99 mg/dL 316 (H) 214 (H) 164 (H) 126 (H) 296 (H)  ? ?Diabetes history: DM 2 ?Outpatient Diabetes medications:  ?Humalog 6 units tid with meals ?Metformin 500 mg bid ?Toujeo 68 units q HS ?Current orders for Inpatient glycemic control:  ?Semglee 65 units q HS ?Novolog 4 units tid with meals ?Novolog moderate tid with meals and HS ? ?Inpatient Diabetes Program Recommendations:   ? ? ? ? ?

## 2022-04-05 ENCOUNTER — Encounter (HOSPITAL_COMMUNITY): Payer: Self-pay | Admitting: Family Medicine

## 2022-04-05 ENCOUNTER — Inpatient Hospital Stay (HOSPITAL_COMMUNITY): Payer: Medicare Other | Admitting: Certified Registered Nurse Anesthetist

## 2022-04-05 ENCOUNTER — Inpatient Hospital Stay (HOSPITAL_COMMUNITY): Payer: Medicare Other

## 2022-04-05 ENCOUNTER — Other Ambulatory Visit: Payer: Self-pay

## 2022-04-05 ENCOUNTER — Encounter (HOSPITAL_COMMUNITY)
Admission: AD | Disposition: A | Discharge status: Skilled Nursing Facility | Payer: Self-pay | Source: Other Acute Inpatient Hospital | Attending: Internal Medicine

## 2022-04-05 DIAGNOSIS — M86672 Other chronic osteomyelitis, left ankle and foot: Secondary | ICD-10-CM | POA: Diagnosis not present

## 2022-04-05 DIAGNOSIS — I10 Essential (primary) hypertension: Secondary | ICD-10-CM | POA: Diagnosis not present

## 2022-04-05 DIAGNOSIS — M869 Osteomyelitis, unspecified: Secondary | ICD-10-CM

## 2022-04-05 DIAGNOSIS — R188 Other ascites: Secondary | ICD-10-CM

## 2022-04-05 DIAGNOSIS — L03116 Cellulitis of left lower limb: Secondary | ICD-10-CM | POA: Diagnosis not present

## 2022-04-05 DIAGNOSIS — Z794 Long term (current) use of insulin: Secondary | ICD-10-CM

## 2022-04-05 DIAGNOSIS — E1151 Type 2 diabetes mellitus with diabetic peripheral angiopathy without gangrene: Secondary | ICD-10-CM

## 2022-04-05 DIAGNOSIS — E872 Acidosis, unspecified: Secondary | ICD-10-CM | POA: Diagnosis not present

## 2022-04-05 DIAGNOSIS — E1169 Type 2 diabetes mellitus with other specified complication: Secondary | ICD-10-CM

## 2022-04-05 HISTORY — PX: AMPUTATION: SHX166

## 2022-04-05 LAB — IRON AND TIBC
Iron: 28 ug/dL (ref 28–170)
Saturation Ratios: 13 % (ref 10.4–31.8)
TIBC: 211 ug/dL — ABNORMAL LOW (ref 250–450)
UIBC: 183 ug/dL

## 2022-04-05 LAB — HEPATIC FUNCTION PANEL
ALT: 13 U/L (ref 0–44)
AST: 23 U/L (ref 15–41)
Albumin: 1.9 g/dL — ABNORMAL LOW (ref 3.5–5.0)
Alkaline Phosphatase: 74 U/L (ref 38–126)
Bilirubin, Direct: 0.3 mg/dL — ABNORMAL HIGH (ref 0.0–0.2)
Indirect Bilirubin: 0.6 mg/dL (ref 0.3–0.9)
Total Bilirubin: 0.9 mg/dL (ref 0.3–1.2)
Total Protein: 5.6 g/dL — ABNORMAL LOW (ref 6.5–8.1)

## 2022-04-05 LAB — CBC
HCT: 26.8 % — ABNORMAL LOW (ref 36.0–46.0)
Hemoglobin: 8.8 g/dL — ABNORMAL LOW (ref 12.0–15.0)
MCH: 33.1 pg (ref 26.0–34.0)
MCHC: 32.8 g/dL (ref 30.0–36.0)
MCV: 100.8 fL — ABNORMAL HIGH (ref 80.0–100.0)
Platelets: 77 10*3/uL — ABNORMAL LOW (ref 150–400)
RBC: 2.66 MIL/uL — ABNORMAL LOW (ref 3.87–5.11)
RDW: 14.9 % (ref 11.5–15.5)
WBC: 2.7 10*3/uL — ABNORMAL LOW (ref 4.0–10.5)
nRBC: 0 % (ref 0.0–0.2)

## 2022-04-05 LAB — RETICULOCYTES
Immature Retic Fract: 24.6 % — ABNORMAL HIGH (ref 2.3–15.9)
RBC.: 2.66 MIL/uL — ABNORMAL LOW (ref 3.87–5.11)
Retic Count, Absolute: 104 10*3/uL (ref 19.0–186.0)
Retic Ct Pct: 3.9 % — ABNORMAL HIGH (ref 0.4–3.1)

## 2022-04-05 LAB — HEMOGLOBIN AND HEMATOCRIT, BLOOD
HCT: 16.9 % — ABNORMAL LOW (ref 36.0–46.0)
Hemoglobin: 5.2 g/dL — CL (ref 12.0–15.0)

## 2022-04-05 LAB — BASIC METABOLIC PANEL
Anion gap: 4 — ABNORMAL LOW (ref 5–15)
BUN: 18 mg/dL (ref 8–23)
CO2: 25 mmol/L (ref 22–32)
Calcium: 7.9 mg/dL — ABNORMAL LOW (ref 8.9–10.3)
Chloride: 109 mmol/L (ref 98–111)
Creatinine, Ser: 0.83 mg/dL (ref 0.44–1.00)
GFR, Estimated: >60 mL/min (ref >60)
Glucose, Bld: 168 mg/dL — ABNORMAL HIGH (ref 70–99)
Potassium: 3.3 mmol/L — ABNORMAL LOW (ref 3.5–5.1)
Sodium: 138 mmol/L (ref 135–145)

## 2022-04-05 LAB — FOLATE: Folate: 9.3 ng/mL (ref >5.9)

## 2022-04-05 LAB — PREPARE RBC (CROSSMATCH)

## 2022-04-05 LAB — VITAMIN B12: Vitamin B-12: 861 pg/mL (ref 180–914)

## 2022-04-05 LAB — FERRITIN: Ferritin: 28 ng/mL (ref 11–307)

## 2022-04-05 LAB — GLUCOSE, CAPILLARY: Glucose-Capillary: 84 mg/dL (ref 70–99)

## 2022-04-05 IMAGING — DX DG FOOT COMPLETE 3+V*L*
3 series · 3 of 3 positions shown · non-contrast
Comparison: [DATE]

CLINICAL DATA: Transmetatarsal amputation of left foot

EXAM:
LEFT FOOT - COMPLETE 3+ VIEW

[foot ap]
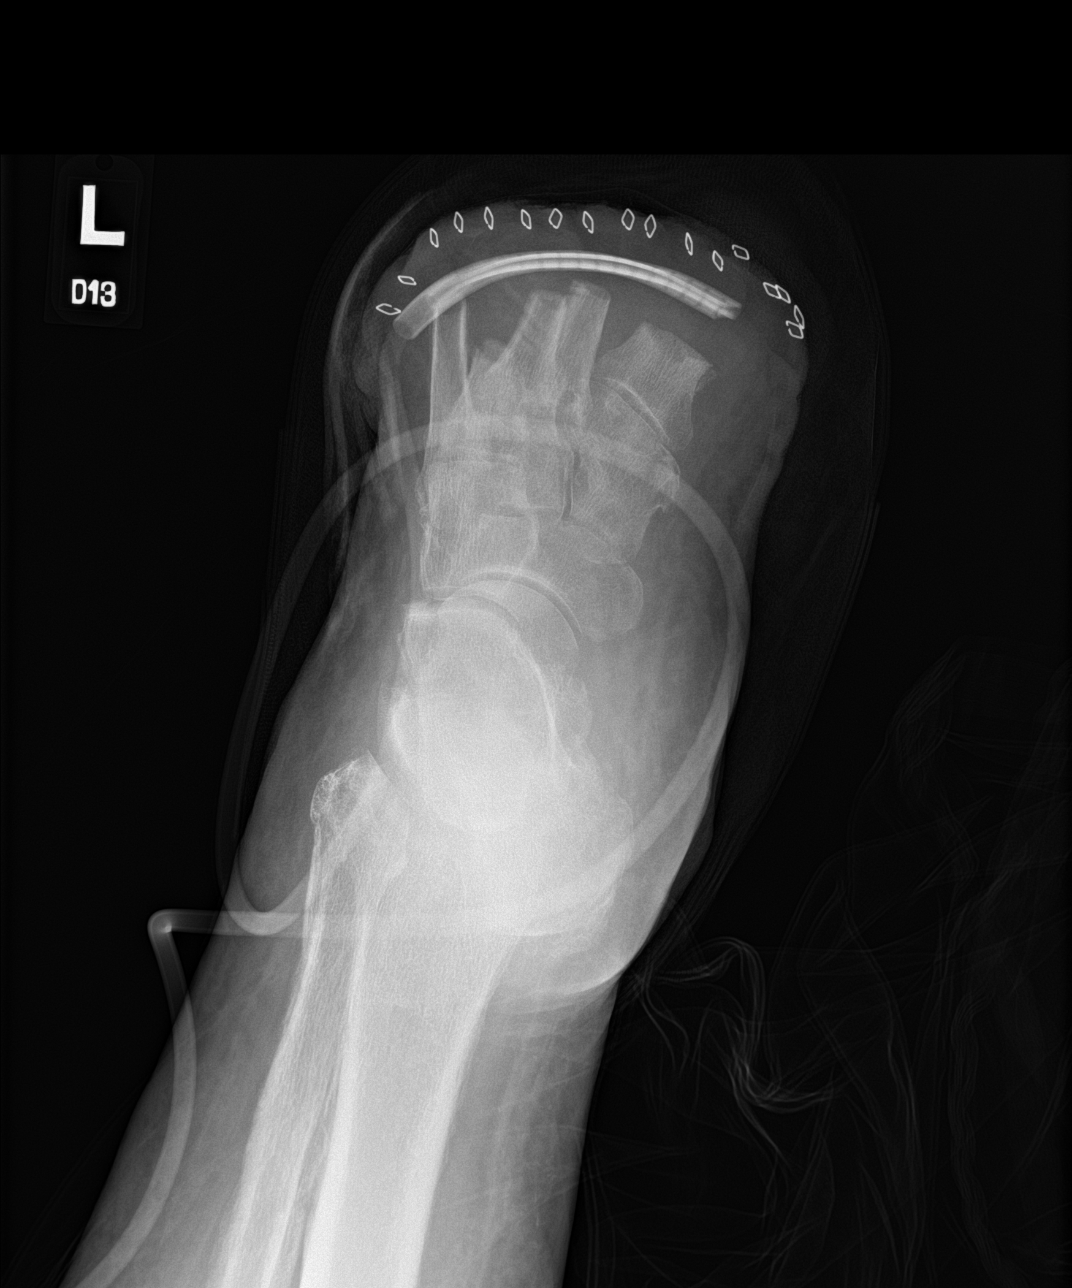

[foot obl]
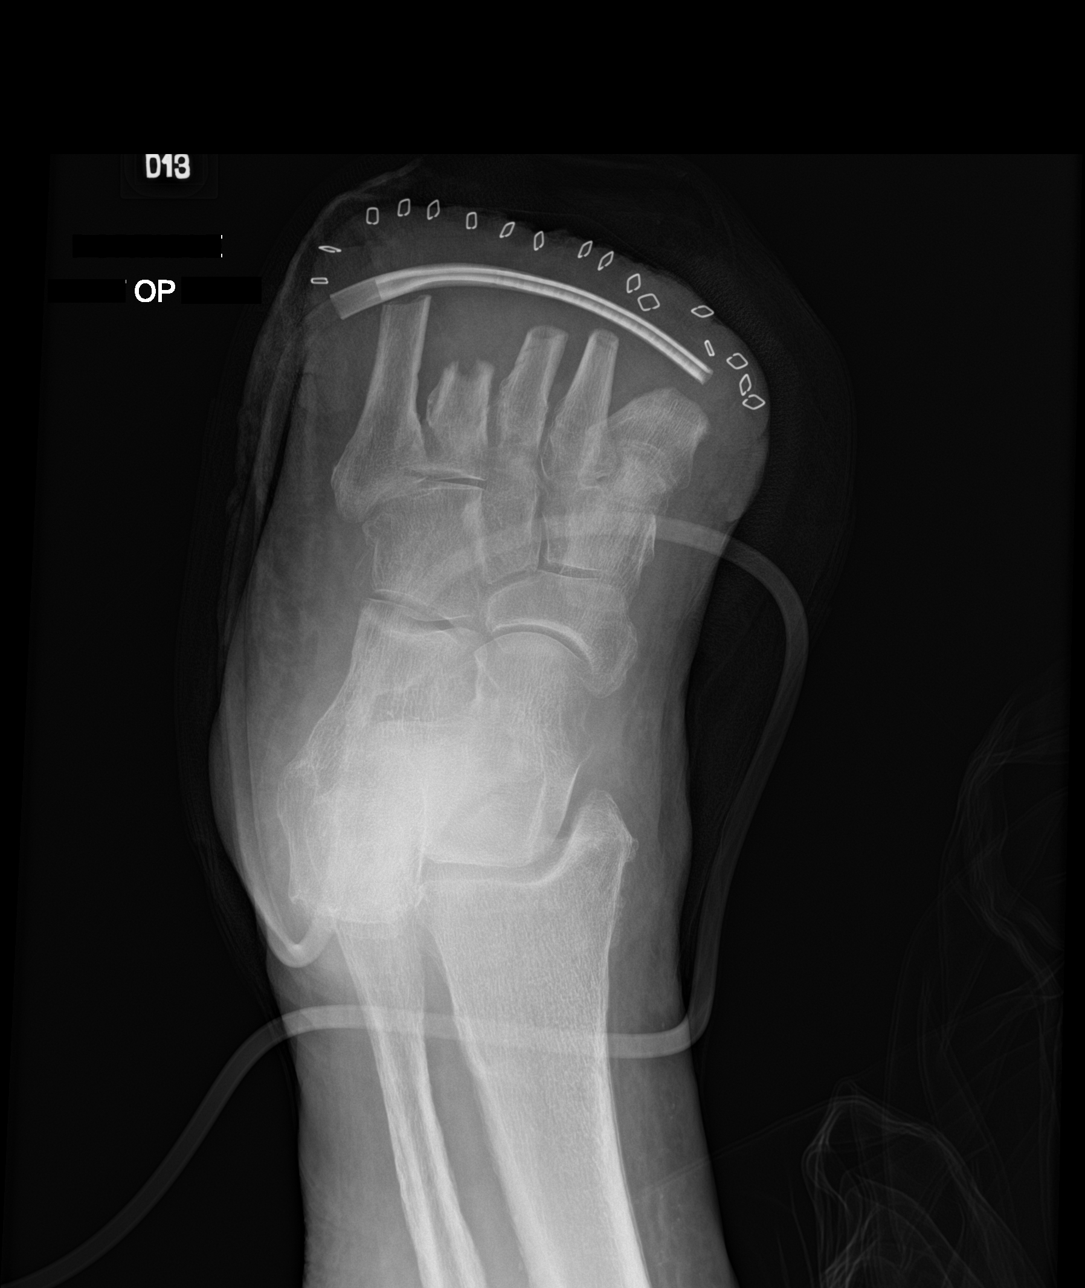

[foot lat]
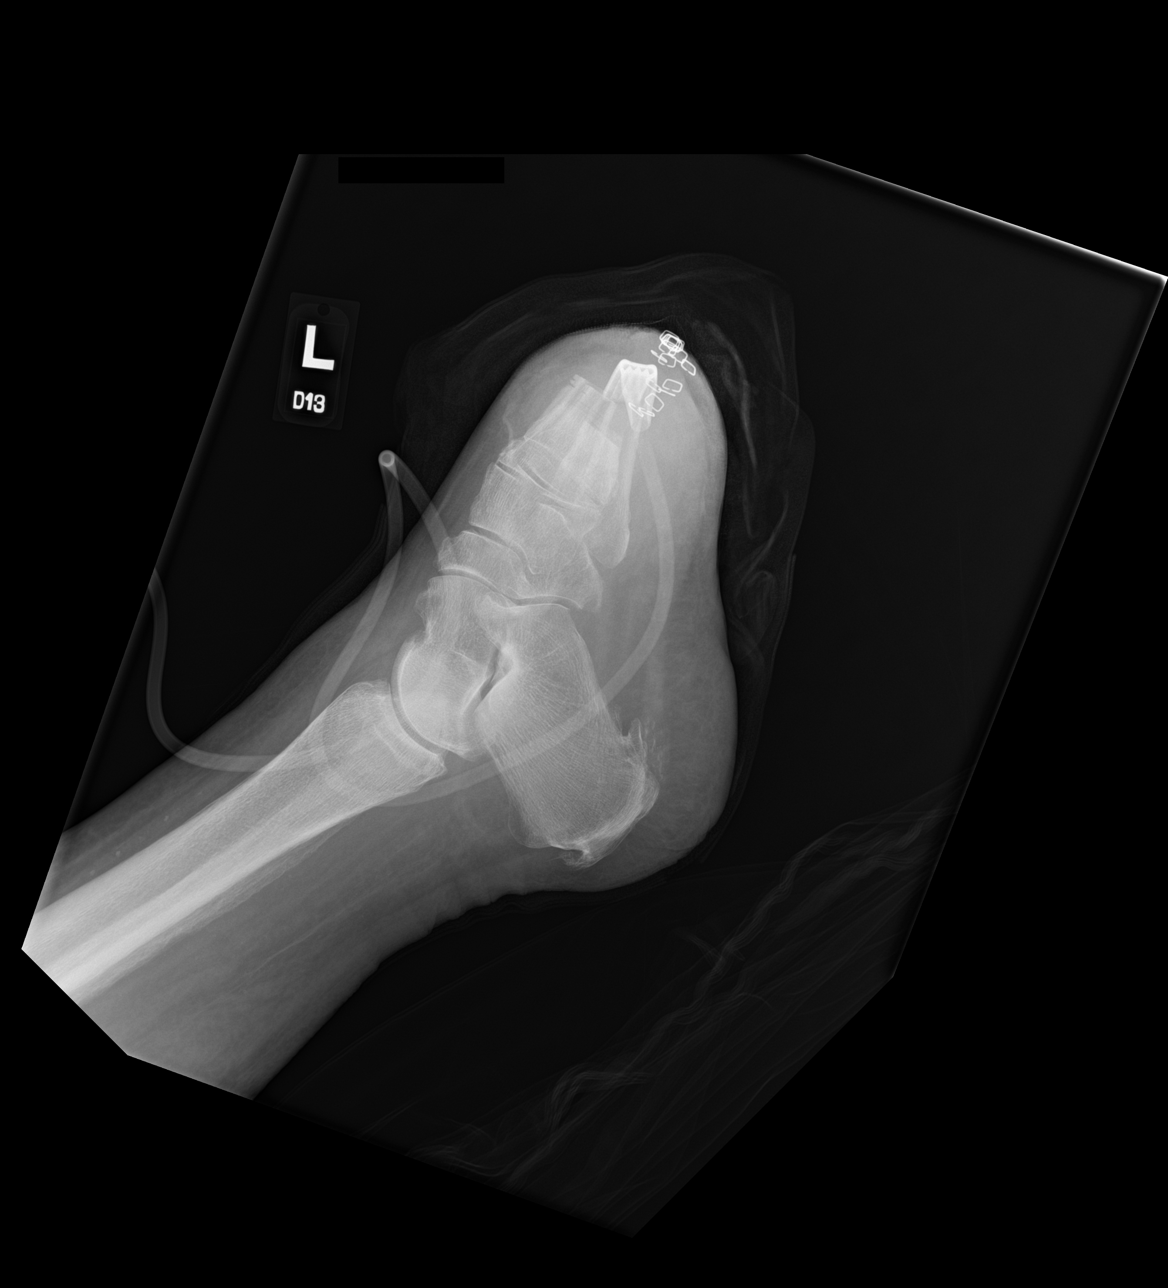

[3 of 3 positions shown; findings below may reference images not displayed]

FINDINGS: There is interval revision of transmetatarsal amputation of left
foot. More of the shafts of metatarsals have been removed,
especially in the the first and fourth metatarsals. Plantar spur is
seen in the calcaneus. There are linear calcifications in the
plantar fascia. There are calcifications at the attachment of
Achilles tendon to the calcaneus suggesting calcific tendinosis.
There is a surgical drain in the soft tissues distal to the
metatarsals.
IMPRESSION: Interval revision of transmetatarsal amputation of left foot.

Other findings as described in the body of the report.

## 2022-04-05 SURGERY — AMPUTATION, FOOT, PARTIAL
Anesthesia: Monitor Anesthesia Care | Site: Foot | Laterality: Left

## 2022-04-05 MED ORDER — ALBUMIN HUMAN 5 % IV SOLN
12.5000 g | Freq: Once | INTRAVENOUS | Status: AC
  Administered 2022-04-05: 12.5 g via INTRAVENOUS

## 2022-04-05 MED ORDER — ONDANSETRON HCL 4 MG/2ML IJ SOLN
INTRAMUSCULAR | Status: DC | PRN
  Administered 2022-04-05: 4 mg via INTRAVENOUS

## 2022-04-05 MED ORDER — LACTATED RINGERS IV SOLN: INTRAVENOUS | Status: DC

## 2022-04-05 MED ORDER — FUROSEMIDE 20 MG PO TABS
20.0000 mg | ORAL_TABLET | Freq: Every day | ORAL | Status: DC
  Administered 2022-04-06 – 2022-04-12 (×7): 20 mg via ORAL
  Filled 2022-04-05 (×7): qty 1

## 2022-04-05 MED ORDER — PHENYLEPHRINE HCL-NACL 20-0.9 MG/250ML-% IV SOLN
INTRAVENOUS | Status: AC
Start: 2022-04-05 — End: 2022-04-06
  Filled 2022-04-05: qty 250

## 2022-04-05 MED ORDER — BUPIVACAINE HCL (PF) 0.5 % IJ SOLN
INTRAMUSCULAR | Status: AC
  Filled 2022-04-05: qty 30

## 2022-04-05 MED ORDER — PHENYLEPHRINE 80 MCG/ML (10ML) SYRINGE FOR IV PUSH (FOR BLOOD PRESSURE SUPPORT)
PREFILLED_SYRINGE | INTRAVENOUS | Status: DC | PRN
  Administered 2022-04-05: 160 ug via INTRAVENOUS
  Administered 2022-04-05: 80 ug via INTRAVENOUS
  Administered 2022-04-05: 120 ug via INTRAVENOUS
  Administered 2022-04-05: 80 ug via INTRAVENOUS
  Administered 2022-04-05: 120 ug via INTRAVENOUS

## 2022-04-05 MED ORDER — MIDAZOLAM HCL 2 MG/2ML IJ SOLN
INTRAMUSCULAR | Status: AC
  Filled 2022-04-05: qty 2

## 2022-04-05 MED ORDER — PHENYLEPHRINE HCL-NACL 20-0.9 MG/250ML-% IV SOLN: 0.0000 ug/min | INTRAVENOUS | Status: DC

## 2022-04-05 MED ORDER — POTASSIUM CHLORIDE 10 MEQ/100ML IV SOLN
10.0000 meq | INTRAVENOUS | Status: AC
  Administered 2022-04-05 (×2): 10 meq via INTRAVENOUS
  Filled 2022-04-05: qty 100

## 2022-04-05 MED ORDER — PHENYLEPHRINE HCL (PRESSORS) 10 MG/ML IV SOLN
INTRAVENOUS | Status: AC
  Filled 2022-04-05: qty 1

## 2022-04-05 MED ORDER — PROPOFOL 500 MG/50ML IV EMUL
INTRAVENOUS | Status: DC | PRN
  Administered 2022-04-05: 100 ug/kg/min via INTRAVENOUS

## 2022-04-05 MED ORDER — BUPIVACAINE-EPINEPHRINE (PF) 0.5% -1:200000 IJ SOLN
INTRAMUSCULAR | Status: DC | PRN
Start: 2022-04-05 — End: 2022-04-05
  Administered 2022-04-05: 16 mL

## 2022-04-05 MED ORDER — DEXAMETHASONE SODIUM PHOSPHATE 10 MG/ML IJ SOLN
INTRAMUSCULAR | Status: AC
  Filled 2022-04-05: qty 1

## 2022-04-05 MED ORDER — PROPOFOL 10 MG/ML IV BOLUS
INTRAVENOUS | Status: DC | PRN
  Administered 2022-04-05: 10 mg via INTRAVENOUS

## 2022-04-05 MED ORDER — PROPOFOL 500 MG/50ML IV EMUL
INTRAVENOUS | Status: AC
  Filled 2022-04-05: qty 50

## 2022-04-05 MED ORDER — FENTANYL CITRATE (PF) 100 MCG/2ML IJ SOLN
INTRAMUSCULAR | Status: AC
  Filled 2022-04-05: qty 2

## 2022-04-05 MED ORDER — LIDOCAINE HCL (PF) 2 % IJ SOLN
INTRAMUSCULAR | Status: AC
  Filled 2022-04-05: qty 5

## 2022-04-05 MED ORDER — FENTANYL CITRATE (PF) 100 MCG/2ML IJ SOLN
INTRAMUSCULAR | Status: DC | PRN
  Administered 2022-04-05: 50 ug via INTRAVENOUS

## 2022-04-05 MED ORDER — ALBUMIN HUMAN 5 % IV SOLN
INTRAVENOUS | Status: AC
  Filled 2022-04-05: qty 500

## 2022-04-05 MED ORDER — MIDAZOLAM HCL 5 MG/5ML IJ SOLN
INTRAMUSCULAR | Status: DC | PRN
  Administered 2022-04-05: 1 mg via INTRAVENOUS

## 2022-04-05 MED ORDER — SODIUM CHLORIDE 0.9% IV SOLUTION: Freq: Once | INTRAVENOUS | Status: DC

## 2022-04-05 MED ORDER — ALBUMIN HUMAN 5 % IV SOLN: INTRAVENOUS | Status: DC | PRN

## 2022-04-05 MED ORDER — PROPOFOL 10 MG/ML IV BOLUS
INTRAVENOUS | Status: AC
  Filled 2022-04-05: qty 20

## 2022-04-05 MED ORDER — ALBUMIN HUMAN 5 % IV SOLN
INTRAVENOUS | Status: AC
Start: 2022-04-05 — End: 2022-04-05
  Filled 2022-04-05: qty 250

## 2022-04-05 MED ORDER — SPIRONOLACTONE 25 MG PO TABS
50.0000 mg | ORAL_TABLET | Freq: Every day | ORAL | Status: DC
  Administered 2022-04-06 – 2022-04-12 (×7): 50 mg via ORAL
  Filled 2022-04-05 (×7): qty 2

## 2022-04-05 MED ORDER — EPHEDRINE SULFATE-NACL 50-0.9 MG/10ML-% IV SOSY
PREFILLED_SYRINGE | INTRAVENOUS | Status: DC | PRN
Start: 2022-04-05 — End: 2022-04-05
  Administered 2022-04-05 (×2): 10 mg via INTRAVENOUS

## 2022-04-05 MED ORDER — ONDANSETRON HCL 4 MG/2ML IJ SOLN
INTRAMUSCULAR | Status: AC
  Filled 2022-04-05: qty 2

## 2022-04-05 MED ORDER — PHENYLEPHRINE HCL-NACL 20-0.9 MG/250ML-% IV SOLN
INTRAVENOUS | Status: DC | PRN
  Administered 2022-04-05: 80 ug/min via INTRAVENOUS

## 2022-04-05 MED ORDER — VANCOMYCIN HCL 1000 MG IV SOLR
INTRAVENOUS | Status: AC
  Filled 2022-04-05: qty 20

## 2022-04-05 MED ORDER — ALBUMIN HUMAN 5 % IV SOLN
INTRAVENOUS | Status: AC
  Filled 2022-04-05: qty 250

## 2022-04-05 MED ORDER — 0.9 % SODIUM CHLORIDE (POUR BTL) OPTIME
TOPICAL | Status: DC | PRN
Start: 2022-04-05 — End: 2022-04-05
  Administered 2022-04-05: 3000 mL

## 2022-04-05 SURGICAL SUPPLY — 42 items
BAG COUNTER SPONGE SURGICOUNT (BAG) IMPLANT
BLADE OSCILLATING/SAGITTAL (BLADE) ×1
BLADE SURG 15 STRL LF DISP TIS (BLADE) ×2 IMPLANT
BLADE SURG 15 STRL SS (BLADE) ×2
BLADE SURG SZ10 CARB STEEL (BLADE) ×6 IMPLANT
BLADE SW THK.38XMED LNG THN (BLADE) ×1 IMPLANT
BNDG ELASTIC 4X5.8 VLCR STR LF (GAUZE/BANDAGES/DRESSINGS) ×2 IMPLANT
BNDG ELASTIC 6X5.8 VLCR STR LF (GAUZE/BANDAGES/DRESSINGS) ×2 IMPLANT
BNDG ESMARK 4X9 LF (GAUZE/BANDAGES/DRESSINGS) ×2 IMPLANT
BNDG GAUZE ELAST 4 BULKY (GAUZE/BANDAGES/DRESSINGS) ×2 IMPLANT
COVER SURGICAL LIGHT HANDLE (MISCELLANEOUS) ×2 IMPLANT
CUFF TOURN SGL QUICK 34 (TOURNIQUET CUFF) ×1
CUFF TRNQT CYL 34X4.125X (TOURNIQUET CUFF) ×1 IMPLANT
DRAPE U-SHAPE 47X51 STRL (DRAPES) ×2 IMPLANT
DRSG ADAPTIC 3X8 NADH LF (GAUZE/BANDAGES/DRESSINGS) ×2 IMPLANT
DRSG PAD ABDOMINAL 8X10 ST (GAUZE/BANDAGES/DRESSINGS) ×2 IMPLANT
ELECT REM PT RETURN 15FT ADLT (MISCELLANEOUS) ×1 IMPLANT
EVACUATOR DRAINAGE 10X20 100CC (DRAIN) IMPLANT
EVACUATOR SILICONE 100CC (DRAIN) ×1
FACESHIELD WRAPAROUND (MASK) ×2 IMPLANT
FACESHIELD WRAPAROUND OR TEAM (MASK) ×2 IMPLANT
GAUZE 4X4 16PLY ~~LOC~~+RFID DBL (SPONGE) ×2 IMPLANT
GAUZE PAD ABD 8X10 STRL (GAUZE/BANDAGES/DRESSINGS) ×2 IMPLANT
GAUZE SPONGE 4X4 12PLY STRL (GAUZE/BANDAGES/DRESSINGS) ×2 IMPLANT
GLOVE BIO SURGEON STRL SZ7.5 (GLOVE) ×2 IMPLANT
GLOVE BIOGEL PI IND STRL 7.5 (GLOVE) ×1 IMPLANT
GLOVE BIOGEL PI INDICATOR 7.5 (GLOVE) ×1
GOWN STRL REUS W/ TWL LRG LVL3 (GOWN DISPOSABLE) ×1 IMPLANT
GOWN STRL REUS W/TWL LRG LVL3 (GOWN DISPOSABLE) ×1
KIT BASIN OR (CUSTOM PROCEDURE TRAY) ×2 IMPLANT
KIT TURNOVER KIT A (KITS) IMPLANT
MANIFOLD NEPTUNE II (INSTRUMENTS) ×2 IMPLANT
NEEDLE HYPO 22GX1.5 SAFETY (NEEDLE) ×3 IMPLANT
PACK ORTHO EXTREMITY (CUSTOM PROCEDURE TRAY) ×2 IMPLANT
PENCIL SMOKE EVACUATOR (MISCELLANEOUS) IMPLANT
PROTECTOR NERVE ULNAR (MISCELLANEOUS) ×2 IMPLANT
STAPLER VISISTAT (STAPLE) ×2 IMPLANT
STOCKINETTE 8 INCH (MISCELLANEOUS) ×1 IMPLANT
SUT ETHILON 3 0 FSL (SUTURE) ×2 IMPLANT
SUT PROLENE 3 0 PS 2 (SUTURE) ×6 IMPLANT
SYR CONTROL 10ML LL (SYRINGE) ×3 IMPLANT
TOWEL OR 17X26 10 PK STRL BLUE (TOWEL DISPOSABLE) ×2 IMPLANT

## 2022-04-05 NOTE — Anesthesia Preprocedure Evaluation (Addendum)
Anesthesia Evaluation  ?Patient identified by MRN, date of birth, ID band ?Patient awake ? ? ? ?Reviewed: ?Allergy & Precautions, NPO status , Patient's Chart, lab work & pertinent test results, reviewed documented beta blocker date and time  ? ?History of Anesthesia Complications ?Negative for: history of anesthetic complications ? ?Airway ?Mallampati: II ? ?TM Distance: >3 FB ?Neck ROM: Full ? ? ? Dental ? ?(+) Dental Advisory Given, Poor Dentition, Chipped, Missing ?  ?Pulmonary ?former smoker,  ?  ?Pulmonary exam normal ? ? ? ? ? ? ? Cardiovascular ?hypertension, Pt. on home beta blockers and Pt. on medications ?+ CAD and + Peripheral Vascular Disease  ?Normal cardiovascular exam ? ? ?'22 TTE - EF 55-60%. Mild MR, trace TR and PR. ? ?  ?Neuro/Psych ?PSYCHIATRIC DISORDERS Anxiety Depression negative neurological ROS ?   ? GI/Hepatic ?negative GI ROS, (+) Cirrhosis  ?  ?  ? , Hepatitis -  ?Endo/Other  ?diabetes, Type 2, Insulin Dependent, Oral Hypoglycemic Agents ?K 3.3 ?Ca 7.9 ? ? Renal/GU ?CRFRenal disease  ? ?  ?Musculoskeletal ? ?(+) Arthritis ,  ? Abdominal ?  ?Peds ? Hematology ? ?(+) Blood dyscrasia, anemia ,  ?Pancytopenia ?CBC ?     Component                Value               Date/Time            ?     WBC                      2.7 (L)             04/05/2022 0525      ?     HGB                      8.8 (L)             04/05/2022 0525      ?     HCT                      26.8 (L)            04/05/2022 0525      ?     PLT                      77 (L)              04/05/2022 0525      ?        ?Anesthesia Other Findings ? ? Reproductive/Obstetrics ? ?  ? ? ? ? ? ? ? ? ? ? ? ? ? ?  ?  ? ? ? ? ? ? ? ?Anesthesia Physical ?Anesthesia Plan ? ?ASA: 4 ? ?Anesthesia Plan: MAC  ? ?Post-op Pain Management:   ? ?Induction:  ? ?PONV Risk Score and Plan: 2 and Propofol infusion and Treatment may vary due to age or medical condition ? ?Airway Management Planned: Natural Airway and Simple  Face Mask ? ?Additional Equipment: None ? ?Intra-op Plan:  ? ?Post-operative Plan:  ? ?Informed Consent: I have reviewed the patients History and Physical, chart, labs and discussed the procedure including the risks, benefits and alternatives for the proposed anesthesia with the patient or authorized representative who has indicated his/her understanding and acceptance.  ? ? ? ? ? ?Plan Discussed with: CRNA  and Anesthesiologist ? ?Anesthesia Plan Comments:   ? ? ? ? ? ?Anesthesia Quick Evaluation ? ?

## 2022-04-05 NOTE — Transfer of Care (Signed)
Immediate Anesthesia Transfer of Care Note ? ?Patient: CHANTE MAYSON ? ?Procedure(s) Performed: Metatarsal amputation revision (Left: Foot) ? ?Patient Location: PACU ? ?Anesthesia Type:MAC ? ?Level of Consciousness: drowsy and patient cooperative ? ?Airway & Oxygen Therapy: Patient Spontanous Breathing and Patient connected to face mask oxygen ? ?Post-op Assessment: Report given to RN and Post -op Vital signs reviewed and stable ? ?Post vital signs: Reviewed and stable ? ?Last Vitals:  ?Vitals Value Taken Time  ?BP 103/51 04/05/22 1615  ?Temp    ?Pulse 64 04/05/22 1616  ?Resp 10 04/05/22 1616  ?SpO2 100 % 04/05/22 1616  ?Vitals shown include unvalidated device data. ? ?Last Pain:  ?Vitals:  ? 04/05/22 1315  ?TempSrc: Oral  ?PainSc:   ?   ? ?Patients Stated Pain Goal: 3 (04/05/22 1311) ? ?Complications: No notable events documented. ?

## 2022-04-05 NOTE — Progress Notes (Signed)
Received report from [facility] for surgery today.   ?

## 2022-04-05 NOTE — Op Note (Signed)
DATE: 04/05/22 ? ? ?SURGEON: Louann Sjogren, DPM ? ?PREOPERATIVE DIAGNOSIS:Left foot osteomyelitis  ? ? ? ? ?POSTOPERATIVE DIAGNOSIS: Same ? ? ? ? ?PROCEDURE PERFORMED:left Transmetatarsal Amputation  ? ? ? ?HEMOSTASIS:  Left ankle tourniquet ? ? ? ? ?ESTIMATED BLOOD LOSS:  Minimal ? ? ? ? ?ANESTHESIA:  General with local 20cc 0.5% marcaine plain ? ? ? ? ?SPECIMENS: Bone culture and pathology  ? ? ?COMPLICATIONS:  None. ? ? ? ? ?INDICATIONS FOR PROCEDURE:  This patient is a pleasant 65 y.o. female who has been hospitalized for infected amputation site and osteomyelitis.   Patient opts for revisional amputation. Risks and complications include but are not limited to infection, recurrence of symptoms, pain, numbness, wound dehiscence, delayed healing, as well as need for future surgery/further amputation.  No guarantees were given or applied.  All questions were answered to the patient?s satisfaction, and the patient has consented to the above procedure.  All preoperative labs and H&P, medical clearances have been obtained and NPO status has been confirmed. ? ?PROCEDURE IN DETAIL: ?After suitable general anesthesia, the patient's left lower extremity was prepped and draped in the usual sterile fashion. Attention was directed to the operative extremity with visible site marking. Incision was planned with skin marker. Dorsal incision was then made with a 15 blade through skin and subcutaneous tissue down to bone. Electrocautery was used to ligate any bleeders. Incision was then made along the plantar aspect of the foot careful to ligate any bleeders with electrocautery making sure to remove previous wound.  Next, the bony cuts were made using sagittal saw to level of metatarsals where bone was felt to be hard and healthy and in a cascade fashion, transecting all of the 5 metatarsal bones Fourth bone was resected slightly further but not all the way back as bone felt adequate and hard and not affected. Next, the amputated  forefoot was then removed. Following this, the wound was thoroughly irrigated and then clean area of bone was biopsied for culture. Hemostasis was then obtained After full hemostasis had been obtained, the wound was again thoroughly irrigated and then the dorsal plantar flap was further tailored so that the plantar flap was lying in a dorsal direction. The skin  was closed with interrupted sutures using 3-0 Nylon in a vertical and horizontal mattress fashion as well as staples. JP drain was placed. An adaptic and a bulky foot dressing were applied, and the patient awakened and transferred to the recovery room in a stable condition. ? ?DISPO: ?Patient to return to medical floor. Wound closed with drain in place will continue to follow patient.  ? ?Louann Sjogren, MD  ? ?

## 2022-04-05 NOTE — Progress Notes (Signed)
? ?PROGRESS NOTE ? ? ? ?Ariel Arnold  [id number] DOB: April 08, 1956 DOA: 04/02/2022 ?PCP: Jacqualine Code, DO ? ? ?Brief Narrative: ?66 y.o. female with medical history significant of aortic atherosclerosis, peripheral vascular disease, type 2 diabetes on insulin, liver cirrhosis secondary to NASH, hypertension, thrombocytopenia, and depression/anxiety.  One week prior to admission, she was experiencing a trail of blood coming from her left foot.  She has history of osteomyelitis previously seen by Dr. March Rummage of the podiatry team.  He performed a transmetatarsal amputation leaving an open wound for drainage.  Chronic osteomyelitis continued to be an issue and she was lost to follow-up over the past 2 months.  She has a history of neuropathy where she cannot feel anything on her feet.  She denies pain, fevers, shortness of breath.  MRI performed on 02/14/22 showed likely persistent osteomyelitis. ? ?4/16 Podiatry seen and evaluated, recommending repeat MRI, antibiotics, possible surgery 4/17. To follow. Patient is clinically stable. If stable changes in wound, consider outpatient management.  ? ? ?Assessment and Plan: ? ?Chronic osteomyelitis ?Acute osteomyelitis ?MRI on 4/16 significant for evidence of acute osteomyelitis of distal third and fifth metatarsals, in addition to entire residual fourth metatarsal. ?-Continue Zosyn since already started; follow-up blood cultures (4/17) ?-Hold off on starting Vancomycin since patient is currently stable and awaiting surgical management ? ?Foot abscess ?Noted on MRI of left foot. On Zosyn as mentioned above. Currently stable. ?-Continue Zosyn ?-Podiatry recommendations: plan for surgical management today (4/18) ? ?Diabetes mellitus, type 2 ?Uncontrolled with hyperglycemia. Hemoglobin A1C of 11.2%. patient is on Humalog, Toujeo as an outpatient.Started on [place] and SSI while inpatient ?-Continue Semglee ?-Continue SSI ? ?Chronic anemia ?Stable. No current evidence of  bleeding. ?-Anemia panel ?-Continue iron supplementation ? ?NASH ?Noted. Stable. History of ascites and portal hypertension. Moderate ascites noted on abdominal ultrasound (4/18). Currently asymptomatic. ?-Start Lasix and spironolactone in AM. Will start lower dose as patient is concerned about starting Lasix and frequent urination; blood pressure is also low-middle end of normotensive range at times ? ?Abdominal distension ?Asymptomatic. Likely secondary to ascites. ? ?Thrombocytopenia ?Chronic and stable. ? ?Depression ?Anxiety ?-Continue Zoloft ? ?Primary hypertension ?-Continue Coreg ? ?Hyperlipidemia ?-Continue Pravastatin ? ?GERD ?-Continue Protonix prn ? ? ?DVT prophylaxis: SCDs ?Code Status:   Code Status: Full Code ?Family Communication: None at bedside ?Disposition Plan: Discharge pending podiatry recommendations/management, outpatient antibiotic regimen if needed ? ? ?Consultants:  ?Podiatry ? ?Procedures:  ?None ? ?Antimicrobials: ?Zosyn IV  ? ? ?Subjective: ?No significant issues this morning. No abdominal pain. ? ?Objective: ?BP 120/62   Pulse 71   Temp 98.3 ?F (36.8 ?C) (Oral)   Resp 18   Ht 5\' 8"  (1.727 m)   Wt 83.9 kg   SpO2 96%   BMI 28.13 kg/m?  ? ?Examination: ? ?General exam: Appears calm and comfortable ?Respiratory system: Clear to auscultation. Respiratory effort normal. ?Cardiovascular system: S1 & S2 heard, RRR. ?Gastrointestinal system: Abdomen is distended, soft and nontender. Normal bowel sounds heard. ?Central nervous system: Alert and oriented. No focal neurological deficits. ?Musculoskeletal: No calf tenderness ?Skin: No cyanosis. No rashes ?Psychiatry: Judgement and insight appear normal. Mood & affect appropriate.  ? ? ?Data Reviewed: I have personally reviewed following labs and imaging studies ? ?CBC ?Lab Results  ?Component Value Date  ? WBC 2.7 (L) 04/05/2022  ? RBC 2.66 (L) 04/05/2022  ? RBC 2.66 (L) 04/05/2022  ? HGB 8.8 (L) 04/05/2022  ? HCT 26.8 (L) 04/05/2022  ? MCV  100.8 (H) 04/05/2022  ? MCH 33.1 04/05/2022  ? PLT 77 (L) 04/05/2022  ? MCHC 32.8 04/05/2022  ? RDW 14.9 04/05/2022  ? LYMPHSABS 1,021 02/07/2022  ? MONOABS 0.1 11/09/2021  ? EOSABS 202 02/07/2022  ? BASOSABS 10 02/07/2022  ? ? ? ?Last metabolic panel ?Lab Results  ?Component Value Date  ? NA 138 04/05/2022  ? K 3.3 (L) 04/05/2022  ? CL 109 04/05/2022  ? CO2 25 04/05/2022  ? BUN 18 04/05/2022  ? CREATININE 0.83 04/05/2022  ? GLUCOSE 168 (H) 04/05/2022  ? GFRNONAA >60 04/05/2022  ? CALCIUM 7.9 (L) 04/05/2022  ? PHOS 1.9 (L) 05/26/2021  ? PROT 5.6 (L) 04/05/2022  ? ALBUMIN 1.9 (L) 04/05/2022  ? BILITOT 0.9 04/05/2022  ? ALKPHOS 74 04/05/2022  ? AST 23 04/05/2022  ? ALT 13 04/05/2022  ? ANIONGAP 4 (L) 04/05/2022  ? ? ?GFR: ?Estimated Creatinine Clearance: 74.7 mL/min (by C-G formula based on SCr of 0.83 mg/dL). ? ?Recent Results (from the past 240 hour(s))  ?Culture, blood (Routine X 2) w Reflex to ID Panel     Status: None (Preliminary result)  ? Collection Time: 04/04/22  4:01 PM  ? Specimen: BLOOD  ?Result Value Ref Range Status  ? Specimen Description   Final  ?  BLOOD BLOOD LEFT FOREARM ?Performed at [facility], [place] [address], [place], [place] [address] ?  ? Special Requests   Final  ?  BOTTLES DRAWN AEROBIC ONLY Blood Culture adequate volume ?Performed at [facility], [place] [address], [place], [place] [address] ?  ? Culture   Final  ?  NO GROWTH < 24 HOURS ?Performed at [facility], [place] [address] [place], [place] [address] ?  ? Report Status PENDING  Incomplete  ?Culture, blood (Routine X 2) w Reflex to ID Panel     Status: None (Preliminary result)  ? Collection Time: 04/04/22  4:01 PM  ? Specimen: BLOOD  ?Result Value Ref Range Status  ? Specimen Description   Final  ?  BLOOD BLOOD RIGHT HAND ?Performed at [facility], [place] [address], [place], [place] [address] ?  ? Special Requests   Final  ?  BOTTLES DRAWN AEROBIC ONLY Blood Culture  adequate volume ?Performed at [facility], [place] [address], [place], [place] [address] ?  ? Culture   Final  ?  NO GROWTH < 24 HOURS ?Performed at [facility], [place] [address] [place], [place] [address] ?  ? Report Status PENDING  Incomplete  ?  ? ? ?Radiology Studies: ?MR FOOT LEFT WO CONTRAST ? ?Result Date: 04/03/2022 ?CLINICAL DATA:  Left foot wound. EXAM: MRI OF THE LEFT FOOT WITHOUT CONTRAST TECHNIQUE: Multiplanar, multisequence MR imaging of the left forefoot was performed. No intravenous contrast was administered. COMPARISON:  Left foot x-rays dated April 01, 2022. MRI left foot dated February 14, 2022. FINDINGS: Bones/Joint/Cartilage Prior transmetatarsal amputation. Similar abnormal marrow edema with corresponding decreased T1 marrow signal involving the residual first metatarsal. New similar bone marrow signal abnormality involving the distal residual third and fifth metatarsals, as well as the entire residual fourth metatarsal. Prominent subperiosteal fluid along the fourth metatarsal shaft. No fracture or dislocation. Progressive patchy marrow edema involving the navicular, cuneiforms, and cuboid, with preserved T1 marrow signal, likely degenerative or stress related. No joint effusion. Ligaments Lisfranc ligament is intact. Muscles and Tendons Postsurgical changes of the flexor and extensor tendons. Similar severe fatty atrophy of  the intrinsic foot muscles. Soft tissue Prominent skin thickening and soft tissue swelling of the amputation stump. Unchanged plantar ulceration at the medial amputation stump with sinus tract extending to the residual first metatarsal. New plantar soft tissue ulceration along the lateral amputation stump with sinus tract extending to the residual fourth metatarsal. New 1.4 x 0.9 x 0.9 cm fluid collection containing a focus of air in the dorsal amputation stump between the residual third and fourth metatarsals (series 7, image 17). There are several  additional small foci of subcutaneous air at the amputation stump. IMPRESSION: 1. Prior transmetatarsal amputation. Similar osteomyelitis of the residual first metatarsal. New osteomyelitis of the distal residual thi

## 2022-04-05 NOTE — Brief Op Note (Signed)
04/02/2022 - 04/05/2022 ? ?2:58 PM ? ?PATIENT:  Ariel Arnold  66 y.o. female ? ?PRE-OPERATIVE DIAGNOSIS:  Left foot osteomyelitis ? ?POST-OPERATIVE DIAGNOSIS:  * No post-op diagnosis entered * ? ?PROCEDURE: Left foot transmetatarsal amputation revision  ? ?SURGEON:  Surgeon(s) and Role: ?   Lorenda Peck, MD - Primary ? ?PHYSICIAN ASSISTANT:  ? ?ASSISTANTS: none  ? ?ANESTHESIA:   local and MAC ? ?EBL:  about 500 ml  ? ?BLOOD ADMINISTERED:none ? ?DRAINS: none and (24m) Jackson-Pratt drain(s) with closed bulb suction in the left foot wound   ? ?LOCAL MEDICATIONS USED:  MARCAINE   , LIDOCAINE , and Amount: 20 ? ml ? ?SPECIMEN:  Source of Specimen:  Left residual metatarsal  ? ?DISPOSITION OF SPECIMEN:   Pathology and culture  ? ?COUNTS:  YES ? ?TOURNIQUET:  * Missing tourniquet times found for documented tourniquets in log: [id number]* ? ?DICTATION: .Note written in EPIC ? ?PLAN OF CARE: Admit to inpatient  ? ?PATIENT DISPOSITION:  PACU - hemodynamically stable. ?  ?Delay start of Pharmacological VTE agent (>24hrs) due to surgical blood loss or risk of bleeding: yes ? ?

## 2022-04-05 NOTE — Interval H&P Note (Signed)
History and Physical Interval Note: ? ?04/05/2022 ?2:55 PM ? ?Ariel Arnold  has presented today for surgery, with the diagnosis of Left foot osteomyelitis.  The various methods of treatment have been discussed with the patient and family. After consideration of risks, benefits and other options for treatment, the patient has consented to  Procedure(s) with comments: ?AMPUTATION FOOT (Left) - Surgical team to do block as a surgical intervention.  The patient's history has been reviewed, patient examined, no change in status, stable for surgery.  I have reviewed the patient's chart and labs.  Questions were answered to the patient's satisfaction.   ? ? ?Lorenda Peck ? ? ?

## 2022-04-05 NOTE — Progress Notes (Signed)
Patient received from PACU with large volume blood loss from procedure. Patient had no fluid orders and h and h had not been checked yet. Dr. Lonny Prude notified with order for stat h and h and to continue the bolus that the patient was receiving in PACU. If BP remains low after bolus, Dr. Lonny Prude asked for the night nurse to call the provided to possibly assess the need for stepdown. ?

## 2022-04-05 NOTE — Anesthesia Postprocedure Evaluation (Signed)
Anesthesia Post Note ? ?Patient: Ariel Arnold ? ?Procedure(s) Performed: Metatarsal amputation revision (Left: Foot) ? ?  ? ?Patient location during evaluation: PACU ?Anesthesia Type: MAC ?Level of consciousness: awake ?Pain management: pain level controlled ?Vital Signs Assessment: post-procedure vital signs reviewed and stable ?Respiratory status: spontaneous breathing, nonlabored ventilation, respiratory function stable and patient connected to nasal cannula oxygen ?Cardiovascular status: stable and blood pressure returned to baseline ?Postop Assessment: no apparent nausea or vomiting ?Anesthetic complications: no ? ? ?No notable events documented. ? ?Last Vitals:  ?Vitals:  ? 04/05/22 1800 04/05/22 1815  ?BP: (!) 99/54 (!) 95/55  ?Pulse: 68 68  ?Resp: 11 11  ?Temp:    ?SpO2: 94% 97%  ?  ?Last Pain:  ?Vitals:  ? 04/05/22 1715  ?TempSrc:   ?PainSc: 0-No pain  ? ? ?  ?  ?  ?  ?  ?  ? ?Errin Whitelaw P Jaime Grizzell ? ? ? ? ?

## 2022-04-06 ENCOUNTER — Ambulatory Visit: Payer: Medicare Other | Admitting: Podiatry

## 2022-04-06 ENCOUNTER — Encounter (HOSPITAL_COMMUNITY): Payer: Self-pay | Admitting: Podiatry

## 2022-04-06 DIAGNOSIS — Z89432 Acquired absence of left foot: Secondary | ICD-10-CM | POA: Diagnosis not present

## 2022-04-06 DIAGNOSIS — M86672 Other chronic osteomyelitis, left ankle and foot: Secondary | ICD-10-CM | POA: Diagnosis not present

## 2022-04-06 LAB — CBC
HCT: 26.5 % — ABNORMAL LOW (ref 36.0–46.0)
HCT: 23 % — ABNORMAL LOW (ref 36.0–46.0)
Hemoglobin: 9.1 g/dL — ABNORMAL LOW (ref 12.0–15.0)
Hemoglobin: 8 g/dL — ABNORMAL LOW (ref 12.0–15.0)
MCH: 31.5 pg (ref 26.0–34.0)
MCH: 31.7 pg (ref 26.0–34.0)
MCHC: 34.3 g/dL (ref 30.0–36.0)
MCHC: 34.8 g/dL (ref 30.0–36.0)
MCV: 91.7 fL (ref 80.0–100.0)
MCV: 91.3 fL (ref 80.0–100.0)
Platelets: 127 10*3/uL — ABNORMAL LOW (ref 150–400)
Platelets: 124 10*3/uL — ABNORMAL LOW (ref 150–400)
RBC: 2.89 MIL/uL — ABNORMAL LOW (ref 3.87–5.11)
RBC: 2.52 MIL/uL — ABNORMAL LOW (ref 3.87–5.11)
RDW: 17.9 % — ABNORMAL HIGH (ref 11.5–15.5)
RDW: 18.3 % — ABNORMAL HIGH (ref 11.5–15.5)
WBC: 8.7 10*3/uL (ref 4.0–10.5)
WBC: 8.5 10*3/uL (ref 4.0–10.5)
nRBC: 0 % (ref 0.0–0.2)
nRBC: 0 % (ref 0.0–0.2)

## 2022-04-06 LAB — GLUCOSE, CAPILLARY
Glucose-Capillary: 190 mg/dL — ABNORMAL HIGH (ref 70–99)
Glucose-Capillary: 127 mg/dL — ABNORMAL HIGH (ref 70–99)
Glucose-Capillary: 96 mg/dL (ref 70–99)
Glucose-Capillary: 152 mg/dL — ABNORMAL HIGH (ref 70–99)
Glucose-Capillary: 172 mg/dL — ABNORMAL HIGH (ref 70–99)
Glucose-Capillary: 157 mg/dL — ABNORMAL HIGH (ref 70–99)
Glucose-Capillary: 115 mg/dL — ABNORMAL HIGH (ref 70–99)
Glucose-Capillary: 161 mg/dL — ABNORMAL HIGH (ref 70–99)
Glucose-Capillary: 152 mg/dL — ABNORMAL HIGH (ref 70–99)

## 2022-04-06 LAB — COMPREHENSIVE METABOLIC PANEL
ALT: 15 U/L (ref 0–44)
AST: 28 U/L (ref 15–41)
Albumin: 2.1 g/dL — ABNORMAL LOW (ref 3.5–5.0)
Alkaline Phosphatase: 48 U/L (ref 38–126)
Anion gap: 4 — ABNORMAL LOW (ref 5–15)
BUN: 25 mg/dL — ABNORMAL HIGH (ref 8–23)
CO2: 25 mmol/L (ref 22–32)
Calcium: 7.8 mg/dL — ABNORMAL LOW (ref 8.9–10.3)
Chloride: 107 mmol/L (ref 98–111)
Creatinine, Ser: 1.17 mg/dL — ABNORMAL HIGH (ref 0.44–1.00)
GFR, Estimated: 51 mL/min — ABNORMAL LOW (ref >60)
Glucose, Bld: 143 mg/dL — ABNORMAL HIGH (ref 70–99)
Potassium: 3.8 mmol/L (ref 3.5–5.1)
Sodium: 136 mmol/L (ref 135–145)
Total Bilirubin: 2 mg/dL — ABNORMAL HIGH (ref 0.3–1.2)
Total Protein: 5.1 g/dL — ABNORMAL LOW (ref 6.5–8.1)

## 2022-04-06 LAB — PREPARE RBC (CROSSMATCH)

## 2022-04-06 LAB — HEMOGLOBIN AND HEMATOCRIT, BLOOD
HCT: 26.2 % — ABNORMAL LOW (ref 36.0–46.0)
Hemoglobin: 9.1 g/dL — ABNORMAL LOW (ref 12.0–15.0)

## 2022-04-06 LAB — MRSA NEXT GEN BY PCR, NASAL: MRSA by PCR Next Gen: NOT DETECTED

## 2022-04-06 MED ORDER — GERHARDT'S BUTT CREAM
TOPICAL_CREAM | Freq: Two times a day (BID) | CUTANEOUS | Status: DC
  Administered 2022-04-08 – 2022-04-12 (×2): 1 via TOPICAL
  Filled 2022-04-06: qty 1

## 2022-04-06 MED ORDER — ONDANSETRON HCL 4 MG/2ML IJ SOLN
4.0000 mg | Freq: Four times a day (QID) | INTRAMUSCULAR | Status: DC | PRN
  Administered 2022-04-06 – 2022-04-09 (×3): 4 mg via INTRAVENOUS
  Filled 2022-04-06 (×3): qty 2

## 2022-04-06 MED ORDER — THROMBI-PAD 3"X3" EX PADS
1.0000 | MEDICATED_PAD | Freq: Once | CUTANEOUS | Status: AC
Start: 2022-04-06 — End: 2022-04-06
  Administered 2022-04-06: 1 via TOPICAL
  Filled 2022-04-06 (×2): qty 1

## 2022-04-06 MED ORDER — LACTATED RINGERS IV BOLUS
250.0000 mL | Freq: Once | INTRAVENOUS | Status: AC
  Administered 2022-04-06: 250 mL via INTRAVENOUS

## 2022-04-06 MED ORDER — ORAL CARE MOUTH RINSE
15.0000 mL | Freq: Two times a day (BID) | OROMUCOSAL | Status: DC
  Administered 2022-04-06 – 2022-04-08 (×6): 15 mL via OROMUCOSAL

## 2022-04-06 MED ORDER — "THROMBI-PAD 3""X3"" EX PADS"
1.0000 | MEDICATED_PAD | Freq: Once | CUTANEOUS | Status: AC
  Administered 2022-04-06: 1 via TOPICAL
  Filled 2022-04-06: qty 1

## 2022-04-06 MED ORDER — SODIUM CHLORIDE 0.9% IV SOLUTION: Freq: Once | INTRAVENOUS | Status: AC

## 2022-04-06 MED ORDER — THROMBI-PAD 3"X3" EX PADS
1.0000 | MEDICATED_PAD | Freq: Once | CUTANEOUS | Status: AC
Start: 2022-04-06 — End: 2022-04-06
  Administered 2022-04-06: 1 via TOPICAL
  Filled 2022-04-06: qty 1

## 2022-04-06 MED ORDER — NYSTATIN 100000 UNIT/GM EX POWD
Freq: Three times a day (TID) | CUTANEOUS | Status: DC
  Administered 2022-04-10: 1 via TOPICAL
  Filled 2022-04-06: qty 15

## 2022-04-06 NOTE — Progress Notes (Signed)
? ? ?  OVERNIGHT PROGRESS REPORT ? ?Notified by RN for bleeding of s/p  Left foot transmetatarsal amputation revision. ?Original bandaging was reported as ace wrap/curlex saturated with blood through to bedding. ? ?RN Art therapist and is following orders at this time. ? ?Blood loss reported as +/- .Hemoglobin had been reported at 5.2g/dL. ?2 units of blood are ordered and awaiting prep/infusion at original reporting/ordering time (2000 hrs) ? ?Due to lower BP , IVF bolus is ordered for the immediate.  ?Follow up labs will be after PRBC transfusions. ? ?Patient does not appear in distress at this time.  ?Monitoring is ongoing with ongoing consideration for SDU. ? ? ? ? ?Chinita Greenland MSNA MSN ACNPC-AG ?Acute Care Nurse Practitioner ?[facility] ?[place] ? ? ? ?

## 2022-04-06 NOTE — Progress Notes (Signed)
?Progress Note ? ? ?Patient: Ariel Arnold [id number] DOB: [birth year] DOA: 04/02/2022     4 ?DOS: the patient was seen and examined on 04/06/2022 ?  ?Brief hospital course: ?66 y.o. female with medical history significant of aortic atherosclerosis, peripheral vascular disease, type 2 diabetes on insulin, liver cirrhosis secondary to NASH, hypertension, thrombocytopenia, and depression/anxiety.  One week prior to admission, she was experiencing a trail of blood coming from her left foot.  She has history of osteomyelitis previously seen by Dr. March Rummage of the podiatry team.  He performed a transmetatarsal amputation leaving an open wound for drainage.  Chronic osteomyelitis continued to be an issue and she was lost to follow-up over the past 2 months.  She has a history of neuropathy where she cannot feel anything on her feet.  She denies pain, fevers, shortness of breath.  MRI performed on 02/14/22 showed likely persistent osteomyelitis. ? ?4/16 Podiatry seen and evaluated, recommending repeat MRI, antibiotics, possible surgery 4/17. To follow. Patient is clinically stable. If stable changes in wound, consider outpatient management.  ? ?Assessment and Plan: ?Chronic osteomyelitis ?Acute osteomyelitis ?MRI on 4/16 significant for evidence of acute osteomyelitis of distal third and fifth metatarsals, in addition to entire residual fourth metatarsal. ?-Patient had been continued on Zosyn.  Wound and blood cultures thus far negative ?-Hold off on starting Vancomycin since patient is currently stable and awaiting surgical management ?  ?Foot abscess ?Noted on MRI of left foot. On Zosyn as mentioned above. Currently stable. ?-Continue Zosyn ?-Podiatry following.  Patient is now status post transmetatarsal amputation of the left ?-See below, postop complications of significant bleeding requiring multiple blood transfusions ?  ?Diabetes mellitus, type 2 ?Uncontrolled with hyperglycemia. Hemoglobin A1C of 11.2%. patient is on  Humalog, Toujeo as an outpatient.Started on [place] and SSI while inpatient ?-Continue Semglee ?-Continue SSI ?  ?Acute on chronic anemia, with acute blood loss anemia ?-Postoperative hemoglobin noted to be in the 5 range overnight. ?-Patient is now status post multiple units of PRBCs ?-Hemoglobin now appears improved ?-Repeat CBC in the morning ?  ?NASH ?Noted. Stable. History of ascites and portal hypertension. Moderate ascites noted on abdominal ultrasound (4/18). Currently asymptomatic. ?-Now on Lasix and spironolactone, started lower dose as patient is concerned about starting Lasix and frequent urination; blood pressure is also low-middle end of normotensive range at times ?  ?Abdominal distension ?Asymptomatic. Likely secondary to ascites. ?  ?Thrombocytopenia ?Chronic and stable. ?-repeat cbc in AM ?  ?Depression ?Anxiety ?-Continue Zoloft ?  ?Primary hypertension ?-Continue Coreg ?  ?Hyperlipidemia ?-Continue Pravastatin ?  ?GERD ?-Continue Protonix prn ?  ?  ? ?Subjective: Feeling nauseated this AM ? ?Physical Exam: ?Vitals:  ? 04/06/22 1100 04/06/22 1200 04/06/22 1300 04/06/22 1324  ?BP: (!) 99/39 (!) 100/43 (!) 90/46   ?Pulse: 77 66 73 73  ?Resp: 16 15 18 14   ?Temp:  98.7 ?F (37.1 ?C)    ?TempSrc:  Oral    ?SpO2: 95% 97% 99% 98%  ?Weight:      ?Height:      ? ?General exam: Awake, laying in bed, in nad ?Respiratory system: Normal respiratory effort, no wheezing ?Cardiovascular system: regular rate, s1, s2 ?Gastrointestinal system: Soft, nondistended, positive BS ?Central nervous system: CN2-12 grossly intact, strength intact ?Extremities: Perfused, no clubbing, LLE with post-op dressing tightly in place ?Skin: Normal skin turgor, no notable skin lesions seen ?Psychiatry: Mood normal // no visual hallucinations  ? ?Data Reviewed: ? ?Labs reviewed ? ?Family Communication:  Pt  in room, family at bedside ? ?Disposition: ?Status is: Inpatient ?Remains inpatient appropriate because: Severity of illness ?  Planned Discharge Destination: Home ? ? ? ? ?Author: ?Marylu Lund, MD ?04/06/2022 1:56 PM ? ?For on call review [email or web address].  ?

## 2022-04-06 NOTE — Progress Notes (Signed)
Rapid Response Event Note  ? ?Reason for Call : excessive bleeding ? ? ?Initial Focused Assessment:  ?BP drop 89/63 ?PT RN reports excessive bleeding through thick dressing 2hrs ago ? ?Plan of Care:  ?Move pt to ICU/SD ? ? ?Event Summary:  ?This RR RN advised pt to reinforce dressing ? ?MD Notified: Clarene [place], NP ?Call Time: 0251a ?Arrival Time: N/A ?End Time: N/A ? ?Quincy Simmonds, RN ?

## 2022-04-06 NOTE — Progress Notes (Signed)
During this patient's bath, the patient's left foot dressing came off of the patient's foot. This nurse did assessed the wound and found no active bleed. The wound was cleansed, a non-adherant bandage was placed directly to the wound, abd, followed by securement of kerlix, and tape. Patient denied any pain/discomfort aside from itching of her arms bilaterally (provider notified). This nurse will continue with the current plan of care and notify the provider of any changes.  ?

## 2022-04-06 NOTE — Progress Notes (Signed)
Left foot transmetatarsal amputation revision  ? 4/18  Persistent bleeding noted from surgical site  Dr Ralene Cork aware x 2 regarding status Approx 1000 cc bloody drainage noted 2300 to present(0300)  Transfer order to stepdown noted Pt A0X4  reviewed plan with pt ?

## 2022-04-06 NOTE — Progress Notes (Signed)
Rapid Response Event Note  ? ?Reason for Call : Extensive blood loss from surgical site ? ? ?Initial Focused Assessment:  ?On arrival, RN x2 were present. 1U PRBC was finishing up and enormous pools of bight red blood seen on floor, under pt foot and bed. ? ?Pt RN reports emptying JP drain x2 tonight ? ?Pt A&Ox4 and no signs of lethargy. ?BP was originally soft 83/51 and 97% RA but increased w/ every retake. When this RR RN left the floor BP was 110/54 (64). ? ?No c/o pain in L foot. ? ?This RR RN redressed the L foot w/ : ?5 ABD pads ?1 1/2 kerlex ?1 whole thombi pad ?1 whole pack of 4x4 gauze ?co-ban ? ? ?Interventions:  ?Ordered thrombi pad ?Rewrapped L foot w/ extensive pressure dressings.  ?250cc LR bolus ?2U PRBC ordered ?Platelets will be rechecked after the 2nd PRBC given ? ?Plan of Care:  ?Due to the amount of blood pt has already lost, combined w/ the extensive dressing placed on foot by this RR RN - if pt continues to bleed profusely from surgical site, we will need to contact surgery for more invasive interventions ? ?MD Notified: Clarene [place] ?Call Time: ?Arrival Time: ?End Time: [id number] ? ?Quincy Simmonds, RN ?

## 2022-04-06 NOTE — Progress Notes (Addendum)
? ? ?  OVERNIGHT PROGRESS REPORT ? ?Notified by RN for second occurrence of bleeding from postop surgical area. ? ?Surgeon was notified by assigned floor nurse, surgeon instructions are in process. ? ?Due to lower blood pressure, second unit of blood infusing and recurrence of situation. Patient is transferred to stepdown unit. ?Original order was 2 units of PRBCs for a HGB of 5.2. ?These have completed. ?Due to the approximate volume of blood loss a third unit is ordered. ? ?Brief History ?Medical history significant of aortic atherosclerosis, peripheral vascular disease, type 2 diabetes on insulin, liver cirrhosis secondary to NASH, hypertension, thrombocytopenia, depression/anxiety. ? ?As noted previously 1 week prior to admission she was experiencing a trail blood coming from her foot.  She has a history of osteomyelitis previously seen by Dr. Samuella Cota of the podiatry team. ? ?She has a history of neuropathy to where she cannot feel her feet. ? ?Currently: ?66 year old female hospitalized for infected amputation site and osteomyelitis. ?This operation was noted as a revision of amputation. ? ?Update 0356 hours  ? ?Remove dressing from patient's extremity as indicated to previous assigned RN. ?There is some clotting noted and some blood trickle noted in various areas of the surgical wound. ?JP bulb he is emptied and recorded by assigned RN. ?Appears to be roughly 50 mL of (red) blood, there is clotting in the JP bulb. ?Fluoroscopy dressing prior to arrival consisted of thrombi-pad, 5 ABD pads, and Kerlix. ? ?3 of the 5 ABD pads were saturated with blood; fourth and fifth ABD pads were approximately 1/4-1/2 saturated with blood and clots. ?Kerlix wrap contained blood in various patchy areas. ? ?New bandaging is in place as of 0350 hrs. ?Extremity is elevated. ? ?Patient is awake and oriented, conversational, and pleasant. ?Patient has movement of extremities x4. ?Pulses are intact. ? ?Blood pressure is 106/49, MAP  68 ?Heart Rate is 77 ?Resp. 15 ?98% Room Air  ? ?Patient is reported to have had last PO intake at 0130 hrs. ?Patient is NPO pending AM surgical rounds. ? ? ?Chinita Greenland MSNA MSN ACNPC-AG ?Acute Care Nurse Practitioner ?[facility] ?[place] ? ? ? ?

## 2022-04-06 NOTE — Progress Notes (Signed)
?Subjective:  ?Patient ID: Ariel Arnold, female    DOB: 1956-07-24,  MRN: [id number] ? ?Patient seen at bedside this morning in ICU. Overnight transferred and appears to have popped open a few staples. She had bleeding and lost a significant amount of blood greater than 1000 cc. She was given a couple units of blood overnight and transferred to ICU. Seen this morning at bedside doing well. No pain. Dressing with no strike through noted. Currently getting transfusion.   ? ?Past Medical History:  ?Diagnosis Date  ? Anemia   ? Anxiety   ? Aortic atherosclerosis (HCC)   ? Arthritis   ? AVM (arteriovenous malformation) of colon   ? Depression   ? Essential hypertension   ? GI bleeding   ? Recurrent  ? Hepatic encephalopathy (HCC)   ? History of kidney stones   ? History of RSV infection   ? Liver cirrhosis secondary to NASH [facility]) 2004  ? Biopsy-proven  ? Obesity   ? Osteomyelitis (HCC)   ? Peripheral vascular disease (HCC)   ? Thrombocytopenia (HCC)   ? Type 2 diabetes mellitus (HCC)   ?  ? ?Past Surgical History:  ?Procedure Laterality Date  ? ABDOMINAL HYSTERECTOMY    ? AMPUTATION Left 04/05/2022  ? Procedure: Metatarsal amputation revision;  Surgeon: Louann Sjogren, MD;  Location: [facility];  Service: Podiatry;  Laterality: Left;  Surgical team to do block  ? COLONOSCOPY    ? Cystoscopy with ureteral stent    ? ESOPHAGOGASTRODUODENOSCOPY    ? ESOPHAGOGASTRODUODENOSCOPY (EGD) WITH PROPOFOL N/A 05/20/2021  ? Procedure: ESOPHAGOGASTRODUODENOSCOPY (EGD) WITH PROPOFOL;  Surgeon: Corbin Ade, MD;  [facility];  Service: Endoscopy;  Laterality: N/A;  needs intubation  ? IR ANGIOGRAM FOLLOW UP STUDY  05/21/2021  ? IR ANGIOGRAM SELECTIVE EACH ADDITIONAL VESSEL  05/21/2021  ? IR EMBO VENOUS NOT HEMORR HEMANG  INC GUIDE ROADMAPPING  05/21/2021  ? IR US GUIDE VASC ACCESS RIGHT  05/21/2021  ? IR VENOGRAM RENAL UNI LEFT  05/21/2021  ? RADIOLOGY WITH ANESTHESIA N/A 05/21/2021  ? Procedure: IR WITH ANESTHESIA;  Surgeon: Radiologist,  Medication, MD;  Location: [facility] OR;  Service: Radiology;  Laterality: N/A;  ? Toe amputations Bilateral   ? TUBAL LIGATION    ? WOUND DEBRIDEMENT Left 11/12/2021  ? Procedure: DEBRIDEMENT WOUND;FIRST METATARSAL RESECTION; APPLICATION OF SKIN GRAFT SUBSTITUTE;  Surgeon: Park Liter, DPM;  Location: [facility];  Service: Podiatry;  Laterality: Left;  ? ? ? ?  Latest Ref Rng & Units 04/05/2022  ?  7:05 PM 04/05/2022  ?  5:25 AM 04/04/2022  ?  5:22 AM  ?CBC  ?WBC 4.0 - 10.5 K/uL  2.7   2.8    ?Hemoglobin 12.0 - 15.0 g/dL 5.2   8.8   8.9    ?Hematocrit 36.0 - 46.0 % 16.9   26.8   27.3    ?Platelets 150 - 400 K/uL  77   82    ? ? ? ?  Latest Ref Rng & Units 04/05/2022  ?  5:25 AM 04/04/2022  ?  5:22 AM 04/03/2022  ?  5:33 AM  ?BMP  ?Glucose 70 - 99 mg/dL 841   324   401    ?BUN 8 - 23 mg/dL 18   19   20     ?Creatinine 0.44 - 1.00 mg/dL   0.27   2.53    ?Sodium 135 - 145 mmol/L 138   135   133    ?  Potassium 3.5 - 5.1 mmol/L 3.3   3.4   3.7    ?Chloride 98 - 111 mmol/L 109   105   102    ?CO2 22 - 32 mmol/L 25   25   26     ?Calcium 8.9 - 10.3 mg/dL 7.9   7.8   7.9    ? ? ? ?Objective:  ? ?Vitals:  ? 04/06/22 0615 04/06/22 0630  ?BP:  (!) 102/32  ?Pulse: 78 74  ?Resp: 18 19  ?Temp:    ?SpO2: 100% 97%  ? ? ?General:AA&O x 3. Normal mood and affect  ? ?Vascular: DP and PT pulses 2/4 bilateral. Brisk capillary refill to all digits. Pedal hair present  ? ?Neruological. Epicritic sensation grossly intact.  ? ?Derm: No strike through of dressing from 3:50 am. Dressing was changes and medial aspect of incision was fount to have a few staples no longer holding together and skin edges separated with hematoma formation medially. Drain with about 25 cc of output. Skin without erythema or edema. No signs of infection noted.  ? ?MSK: MMT 5/5 in dorsiflexion, plantar flexion, inversion and eversion. Normal joint ROM without pain or crepitus.  ? ? ?Assessment & Plan:  ?Patient was evaluated and treated and all questions answered. ? ?DX: Left  foot wound/osteomyelitis  ?Wound care: Staples that were loose were removed from medial incision and that aspect of incision was reinforced with multiple silk sutures. Adequate closure and hemostasis was achieved. Drain was pulled as was already halfway out. Foot was redressed with betadine and DSD with multiple ABDs.  ?Antibiotics: Per primary will follow cultures Currently no organisms seen on gram stain.  ?DME: Post-op shoe   ?Discussed with patient diagnosis and treatment options.  ?Discussed with patient options. At this time will keep dressing intact. Bleeding is under control and will monitor Hg for return to normal. Patient will be NWB for the time. Will keep dressing intact. Continue to follow cultures if positive may need to consider ID consult. If negative she can be discharged and will keep dressing intact until outpatient follow-up. .  ? ?04/08/22, MD ? ?Accessible via secure chat for questions or concerns. ? ?

## 2022-04-07 DIAGNOSIS — M86672 Other chronic osteomyelitis, left ankle and foot: Secondary | ICD-10-CM | POA: Diagnosis not present

## 2022-04-07 DIAGNOSIS — Z89432 Acquired absence of left foot: Secondary | ICD-10-CM | POA: Diagnosis not present

## 2022-04-07 LAB — GLUCOSE, CAPILLARY
Glucose-Capillary: 46 mg/dL — ABNORMAL LOW (ref 70–99)
Glucose-Capillary: 88 mg/dL (ref 70–99)
Glucose-Capillary: 112 mg/dL — ABNORMAL HIGH (ref 70–99)
Glucose-Capillary: 144 mg/dL — ABNORMAL HIGH (ref 70–99)
Glucose-Capillary: 222 mg/dL — ABNORMAL HIGH (ref 70–99)

## 2022-04-07 LAB — SURGICAL PATHOLOGY

## 2022-04-07 MED ORDER — LIVING WELL WITH DIABETES BOOK
Freq: Once | Status: AC
  Filled 2022-04-07: qty 1

## 2022-04-07 MED ORDER — DIPHENHYDRAMINE-ZINC ACETATE 2-0.1 % EX CREA
TOPICAL_CREAM | Freq: Three times a day (TID) | CUTANEOUS | Status: DC | PRN
  Filled 2022-04-07: qty 28

## 2022-04-07 NOTE — Progress Notes (Signed)
?Progress Note ? ? ?Patient: Ariel Arnold FMB:[id number] DOB: [birth year] DOA: 04/02/2022     5 ?DOS: the patient was seen and examined on 04/07/2022 ?  ?Brief hospital course: ?66 y.o. female with medical history significant of aortic atherosclerosis, peripheral vascular disease, type 2 diabetes on insulin, liver cirrhosis secondary to NASH, hypertension, thrombocytopenia, and depression/anxiety.  One week prior to admission, she was experiencing a trail of blood coming from her left foot.  She has history of osteomyelitis previously seen by Dr. Samuella Cota of the podiatry team.  He performed a transmetatarsal amputation leaving an open wound for drainage.  Chronic osteomyelitis continued to be an issue and she was lost to follow-up over the past 2 months.  She has a history of neuropathy where she cannot feel anything on her feet.  She denies pain, fevers, shortness of breath.  MRI performed on 02/14/22 showed likely persistent osteomyelitis. ? ?4/16 Podiatry seen and evaluated, recommending repeat MRI, antibiotics, possible surgery 4/17. To follow. Patient is clinically stable. If stable changes in wound, consider outpatient management.  ? ?Assessment and Plan: ?Chronic osteomyelitis ?Acute osteomyelitis ?MRI on 4/16 significant for evidence of acute osteomyelitis of distal third and fifth metatarsals, in addition to entire residual fourth metatarsal. ?-Patient had been continued on Zosyn.  Wound and blood cultures thus far negative ?-Podiatry recommends one week of abx to continue on d/c ? ?Foot abscess ?Noted on MRI of left foot. On Zosyn as mentioned above. Currently stable. ?-Continue Zosyn ?-Podiatry following.  Patient is now status post transmetatarsal amputation of the left ?-See below, recent postop complications of significant bleeding requiring multiple blood transfusions ?  ?Diabetes mellitus, type 2 ?Uncontrolled with hyperglycemia. Hemoglobin A1C of 11.2%. patient is on Humalog, Toujeo as an  outpatient.Started on Semglee and SSI while inpatient ?-Continue Semglee ?-Continue SSI ?-Noted to have episode of hypoglycemia to 46 this AM ?  ?Acute on chronic anemia, with acute blood loss anemia ?-Postoperative hemoglobin noted to be in the 5 on the evening of 4/18 ?-Patient is now status post multiple units of PRBCs ?-Hemoglobin now appears stabilized ?-Repeat CBC in the morning ?  ?NASH ?Noted. Stable. History of ascites and portal hypertension. Moderate ascites noted on abdominal ultrasound (4/18). Currently asymptomatic. ?-Now on Lasix and spironolactone, started lower dose as patient is concerned about starting Lasix and frequent urination; blood pressure is also low-middle end of normotensive range at times ?  ?Abdominal distension ?Asymptomatic. Likely secondary to ascites. ?  ?Thrombocytopenia ?Chronic and stable. ?-repeat cbc in AM ?  ?Depression ?Anxiety ?-Continue Zoloft ?  ?Primary hypertension ?-Continue Coreg ?  ?Hyperlipidemia ?-Continue Pravastatin ?  ?GERD ?-Continue Protonix prn ?  ?  ? ?Subjective: States feeling better this AM ? ?Physical Exam: ?Vitals:  ? 04/07/22 0621 04/07/22 0622 04/07/22 0805 04/07/22 1152  ?BP: (!) 103/44     ?Pulse:  72    ?Resp:  16    ?Temp:   97.8 ?F (36.6 ?C) 98 ?F (36.7 ?C)  ?TempSrc:   Oral Oral  ?SpO2:  96%    ?Weight:      ?Height:      ? ?General exam: Conversant, in no acute distress ?Respiratory system: normal chest rise, clear, no audible wheezing ?Cardiovascular system: regular rhythm, s1-s2 ?Gastrointestinal system: Nondistended, nontender, pos BS ?Central nervous system: No seizures, no tremors ?Extremities: No cyanosis, no joint deformities ?Skin: No rashes, no pallor ?Psychiatry: Affect normal // no auditory hallucinations  ? ?Data Reviewed: ? ?Labs reviewed: K 3.8, Cr 1.17 ? ?  Family Communication:  Pt in room, family at bedside ? ?Disposition: ?Status is: Inpatient ?Remains inpatient appropriate because: Severity of illness ? Planned Discharge  Destination: Home ? ? ? ? ?Author: ?Rickey Barbara, MD ?04/07/2022 2:44 PM ? ?For on call review [email or web address].  ?

## 2022-04-07 NOTE — Progress Notes (Signed)
Orthopedic Tech Progress Note ?Patient Details:  ?Ariel Arnold ?[birth year] ?[id number] ? ?Patient ID: Ariel Arnold, female   DOB: 11/10/1956, 66 y.o.   MRN: [id number] ? ?Ariel Arnold ?04/07/2022, 1:27 PM ?Patient already has a shoe. ?

## 2022-04-07 NOTE — Progress Notes (Signed)
?Subjective:  ?Patient ID: Ariel Arnold, female    DOB: 1956-06-03,  MRN: [id number] ? ?Seen this morning at bedside doing well. Dressing intact with no major bleeding noted overnight. No pain.    ? ?Past Medical History:  ?Diagnosis Date  ? Anemia   ? Anxiety   ? Aortic atherosclerosis (HCC)   ? Arthritis   ? AVM (arteriovenous malformation) of colon   ? Depression   ? Essential hypertension   ? GI bleeding   ? Recurrent  ? Hepatic encephalopathy (HCC)   ? History of kidney stones   ? History of RSV infection   ? Liver cirrhosis secondary to NASH [facility]) 2004  ? Biopsy-proven  ? Obesity   ? Osteomyelitis (HCC)   ? Peripheral vascular disease (HCC)   ? Thrombocytopenia (HCC)   ? Type 2 diabetes mellitus (HCC)   ?  ? ?Past Surgical History:  ?Procedure Laterality Date  ? ABDOMINAL HYSTERECTOMY    ? AMPUTATION Left 04/05/2022  ? Procedure: Metatarsal amputation revision;  Surgeon: Louann Sjogren, MD;  Location: [facility];  Service: Podiatry;  Laterality: Left;  Surgical team to do block  ? COLONOSCOPY    ? Cystoscopy with ureteral stent    ? ESOPHAGOGASTRODUODENOSCOPY    ? ESOPHAGOGASTRODUODENOSCOPY (EGD) WITH PROPOFOL N/A 05/20/2021  ? Procedure: ESOPHAGOGASTRODUODENOSCOPY (EGD) WITH PROPOFOL;  Surgeon: Corbin Ade, MD;  [facility];  Service: Endoscopy;  Laterality: N/A;  needs intubation  ? IR ANGIOGRAM FOLLOW UP STUDY  05/21/2021  ? IR ANGIOGRAM SELECTIVE EACH ADDITIONAL VESSEL  05/21/2021  ? IR EMBO VENOUS NOT HEMORR HEMANG  INC GUIDE ROADMAPPING  05/21/2021  ? IR US GUIDE VASC ACCESS RIGHT  05/21/2021  ? IR VENOGRAM RENAL UNI LEFT  05/21/2021  ? RADIOLOGY WITH ANESTHESIA N/A 05/21/2021  ? Procedure: IR WITH ANESTHESIA;  Surgeon: Radiologist, Medication, MD;  Location: [facility];  Service: Radiology;  Laterality: N/A;  ? Toe amputations Bilateral   ? TUBAL LIGATION    ? WOUND DEBRIDEMENT Left 11/12/2021  ? Procedure: DEBRIDEMENT WOUND;FIRST METATARSAL RESECTION; APPLICATION OF SKIN GRAFT SUBSTITUTE;  Surgeon: Park Liter, DPM;  Location: [facility];  Service: Podiatry;  Laterality: Left;  ? ? ? ?  Latest Ref Rng & Units 04/06/2022  ?  8:23 PM 04/06/2022  ? 10:00 AM 04/05/2022  ?  7:05 PM  ?CBC  ?WBC 4.0 - 10.5 K/uL 8.5   8.7     ?Hemoglobin 12.0 - 15.0 g/dL 8.0   9.1    ? 9.1   5.2    ?Hematocrit 36.0 - 46.0 % 23.0   26.2    ? 26.5   16.9    ?Platelets 150 - 400 K/uL 124   127     ? ? ? ?  Latest Ref Rng & Units 04/06/2022  ?  8:23 PM 04/05/2022  ?  5:25 AM 04/04/2022  ?  5:22 AM  ?BMP  ?Glucose 70 - 99 mg/dL 814   481   856    ?BUN 8 - 23 mg/dL 25   18   19     ?Creatinine 0.44 - 1.00 mg/dL   3.14   9.70    ?Sodium 135 - 145 mmol/L 136   138   135    ?Potassium 3.5 - 5.1 mmol/L 3.8   3.3   3.4    ?Chloride 98 - 111 mmol/L 107   109   105    ?CO2 22 - 32 mmol/L  25   25   25     ?Calcium 8.9 - 10.3 mg/dL 7.8   7.9   7.8    ? ? ? ?Objective:  ? ?Vitals:  ? 04/07/22 04/09/22 04/07/22 0622  ?BP: (!) 103/44   ?Pulse:  72  ?Resp:  16  ?Temp:    ?SpO2:  96%  ? ? ?General:AA&O x 3. Normal mood and affect  ? ?Vascular: DP and PT pulses 2/4 bilateral. Brisk capillary refill to all digits. Pedal hair present  ? ?Neruological. Epicritic sensation grossly intact.  ? ?Derm: No strike through of dressing today. Clean dry and intact.Left in place this morning.  ? ?MSK: MMT 5/5 in dorsiflexion, plantar flexion, inversion and eversion. Normal joint ROM without pain or crepitus.  ? ? ?Assessment & Plan:  ?Patient was evaluated and treated and all questions answered. ? ?DX: Left foot wound/osteomyelitis  ?Wound care: Will leave dressing intact until follow-up outpatient.  ?Antibiotics: Per primary cultures NGTD x 1 day.  ?DME: Post-op shoe  Will be NWB.  ?Discussed with patient diagnosis and treatment options.  ?Discussed with patient options. At this time will keep dressing intact. Bleeding is under control and will monitor Hg.04/09/22 Patient will be NWB for the time. Patient  ok for discharge from podiatry standpoint. Likely need a week of oral abx upon  discharge and will keep dressing intact until follow-up which we will arrange.  ? ?Marland Kitchen, MD ? ?Accessible via secure chat for questions or concerns. ? ?

## 2022-04-07 NOTE — Progress Notes (Signed)
This nurse entered the room to find the patient's left foot dressing minimally soaked through with serosanguinous discharge. This nurse reenforced the current dressing with another ABD pad and kerlix. Will continue with the current plan of care and to observe this wound and report any excessive bleeding to the provider.  ?

## 2022-04-07 NOTE — Evaluation (Signed)
Physical Therapy Evaluation ?Patient Details ?Name: Ariel Arnold ?MRN: [id number] ?DOB: 1956/11/08 ?Today's Date: 04/07/2022 ? ?History of Present Illness ? Patient is 66 y.o. female presented to ED for Lt transmet residual limb wound with malodor. MRI on 4/16 significant for evidence of acute osteomyelitis of distal third and fifth metatarsals, in addition to entire residual fourth metatarsal. Patient now s/p Lt transmetatarsal amputation revision on [id number] complicated by significant blood loss that night requiring 3 units PRBC since surgery. PMH significant of aortic atherosclerosis, peripheral vascular disease, type 2 diabetes on insulin, liver cirrhosis secondary to NASH, hypertension, thrombocytopenia, and depression/anxiety. ? ?  ?Clinical Impression ? Ariel Arnold is 66 y.o. female admitted with above HPI and diagnosis. Patient is currently limited by functional impairments below (see PT problem list). Patient lives with her daughter and is independence with RW at baseline. Currently pt weak and fatigued and requires Min assist for sit<>stand and to pivot to recliner. She is NWB on L lLE and unable to complete lateral hops at EOB due to weakness. Patient will benefit from continued skilled PT interventions to address impairments and progress independence with mobility, recommending HHPT. Patient will need wheelchair to safely discharge home and reports she has one at her sisters home.  Acute PT will follow and progress as able.    ?   ? ?Recommendations for follow up therapy are one component of a multi-disciplinary discharge planning process, led by the attending physician.  Recommendations may be updated based on patient status, additional functional criteria and insurance authorization. ? ?Follow Up Recommendations Home health PT ? ?  ?Assistance Recommended at Discharge Frequent or constant Supervision/Assistance  ?Patient can return home with the following ? A lot of help with walking and/or  transfers;A lot of help with bathing/dressing/bathroom;Assistance with cooking/housework;Direct supervision/assist for medications management;Direct supervision/assist for financial management;Assist for transportation;Help with stairs or ramp for entrance ? ?  ?Equipment Recommendations None recommended by PT  ?Recommendations for Other Services ?    ?  ?Functional Status Assessment Patient has had a recent decline in their functional status and demonstrates the ability to make significant improvements in function in a reasonable and predictable amount of time.  ? ?  ?Precautions / Restrictions Precautions ?Precautions: Fall ?Restrictions ?Weight Bearing Restrictions: Yes ?LLE Weight Bearing: Non weight bearing  ? ?  ? ?Mobility ? Bed Mobility ?Overal bed mobility: Needs Assistance ?Bed Mobility: Supine to Sit ?  ?  ?Supine to sit: Min assist ?  ?  ?General bed mobility comments: pt taking extra time to sit up to EOB, cues to use bed rail, assist to pivot and scoot with use of bed pad. ?  ? ?Transfers ?Overall transfer level: Needs assistance ?Equipment used: Rolling walker (2 wheels) ?Transfers: Sit to/from Stand, Bed to chair/wheelchair/BSC ?Sit to Stand: Min assist ?Stand pivot transfers: Min assist ?  ?  ?  ?  ?General transfer comment: Min assist with cues for NWB on Lt LE during rise and pivot to recliner. pt completed 2x sit<>stand with assist to steady in standing and to guide walker to turn to recliner. ?  ? ?Ambulation/Gait ?  ?  ?  ?  ?  ?  ?  ?  ? ?Stairs ?  ?  ?  ?  ?  ? ?Wheelchair Mobility ?  ? ?Modified Rankin (Stroke Patients Only) ?  ? ?  ? ?Balance Overall balance assessment: Needs assistance ?Sitting-balance support: Feet supported, Bilateral upper extremity supported ?Sitting balance-Leahy Scale: Fair ?  ?  ?  Standing balance support: Reliant on assistive device for balance, Bilateral upper extremity supported ?Standing balance-Leahy Scale: Poor ?  ?  ?  ?  ?  ?  ?  ?  ?  ?  ?  ?  ?    ? ? ? ?Pertinent Vitals/Pain Pain Assessment ?Pain Assessment: No/denies pain  ? ? ?Home Living Family/patient expects to be discharged to:: Private residence ?Living Arrangements: Children ?Available Help at Discharge: Family;Available 24 hours/day ?Type of Home: House ?Home Access: Stairs to enter ?Entrance Stairs-Rails: None ?Entrance Stairs-Number of Steps: 3+1 ?  ?Home Layout: One level ?Home Equipment: Grab bars - tub/shower;Rolling Walker (2 wheels);Wheelchair - manual;Shower seat ?Additional Comments: pt reports he daugter lives with her and can assist her  ?  ?Prior Function Prior Level of Function : Needs assist;Independent/Modified Independent ?  ?  ?  ?  ?  ?  ?Mobility Comments: pt reports she was ambulating with RW, but is not forthcoming with overall independence level ?  ?  ? ? ?Hand Dominance  ? Dominant Hand: Right ? ?  ?Extremity/Trunk Assessment  ? Upper Extremity Assessment ?Upper Extremity Assessment: Generalized weakness ?  ? ?Lower Extremity Assessment ?Lower Extremity Assessment: Generalized weakness ?  ? ?Cervical / Trunk Assessment ?Cervical / Trunk Assessment: Normal  ?Communication  ? Communication: No difficulties  ?Cognition Arousal/Alertness: Lethargic ?Behavior During Therapy: [facility] for tasks assessed/performed ?Overall Cognitive Status: No family/caregiver present to determine baseline cognitive functioning ?  ?  ?  ?  ?  ?  ?  ?  ?  ?  ?  ?  ?  ?  ?  ?  ?General Comments: A&Ox4 but fatigued ?  ?  ? ?  ?General Comments   ? ?  ?Exercises    ? ?Assessment/Plan  ?  ?PT Assessment Patient needs continued PT services  ?PT Problem List Decreased strength;Decreased range of motion;Decreased activity tolerance;Decreased balance;Decreased mobility;Decreased knowledge of use of DME;Decreased knowledge of precautions;Pain ? ?   ?  ?PT Treatment Interventions DME instruction;Gait training;Stair training;Neuromuscular re-education;Balance training;Therapeutic exercise;Therapeutic  activities;Functional mobility training;Patient/family education   ? ?PT Goals (Current goals can be found in the Care Plan section)  ?Acute Rehab PT Goals ?Patient Stated Goal: feel better and get home ?PT Goal Formulation: With patient ?Time For Goal Achievement: 04/21/22 ?Potential to Achieve Goals: Good ? ?  ?Frequency Min 3X/week ?  ? ? ?Co-evaluation   ?  ?  ?  ?  ? ? ?  ?AM-PAC PT "6 Clicks" Mobility  ?Outcome Measure Help needed turning from your back to your side while in a flat bed without using bedrails?: A Little ?Help needed moving from lying on your back to sitting on the side of a flat bed without using bedrails?: A Little ?Help needed moving to and from a bed to a chair (including a wheelchair)?: A Little ?Help needed standing up from a chair using your arms (e.g., wheelchair or bedside chair)?: A Little ?Help needed to walk in hospital room?: A Lot ?Help needed climbing 3-5 steps with a railing? : Total ?6 Click Score: 15 ? ?  ?End of Session Equipment Utilized During Treatment: Gait belt ?Activity Tolerance: Patient tolerated treatment well ?Patient left: in chair;with call bell/phone within reach ?Nurse Communication: Mobility status ?PT Visit Diagnosis: Unsteadiness on feet (R26.81);Muscle weakness (generalized) (M62.81);Difficulty in walking, not elsewhere classified (R26.2) ?  ? ?Time: [id number] ?PT Time Calculation (min) (ACUTE ONLY): 31 min ? ? ?Charges:   PT Evaluation ?$PT Eval Moderate  Complexity: 1 Mod ?PT Treatments ?$Therapeutic Activity: 8-22 mins ?  ?   ? ? ?Gwynneth Albright PT, DPT ?Acute Rehabilitation Services ?Office [phone] ?Pager [phone]  ? ?Jacques Navy ?04/07/2022, 3:32 PM ? ?

## 2022-04-07 NOTE — TOC Initial Note (Signed)
Transition of Care (TOC) - Initial/Assessment Note  ? ? ?Patient Details  ?Name: Ariel Arnold ?MRN: [id number] ?Date of Birth: 1956/02/24 ? ?Transition of Care [facility] [place]) CM/SW Contact:    ?Dessa Phi, RN ?Phone Number: ?04/07/2022, 3:45 PM ? ?Clinical Narrative: PT recc HHPT;Attempted to provide [place] services-currently no HHC agency to accept-No-[facility].Will continue to check.                ? ? ?Expected Discharge Plan: [place] ?Barriers to Discharge: Continued Medical Work up ? ? ?Patient Goals and CMS Choice ?Patient states their goals for this hospitalization and ongoing recovery are:: Home ?CMS Medicare.gov Compare Post Acute Care list provided to:: Patient Represenative (must comment) Manuela Schwartz dtr) ?Choice offered to / list presented to : Adult Children ? ?Expected Discharge Plan and Services ?Expected Discharge Plan: [place] ?  ?Discharge Planning Services: CM Consult ?Post Acute Care Choice: Home Health ?Living arrangements for the past 2 months: [place] ?                ?  ?  ?  ?  ?  ?  ?  ?  ?  ?  ? ?Prior Living Arrangements/Services ?Living arrangements for the past 2 months: [place] ?Lives with:: Adult Children ?Patient language and need for interpreter reviewed:: Yes ?Do you feel safe going back to the place where you live?: Yes      ?Need for Family Participation in Patient Care: Yes (Comment) ?Care giver support system in place?: Yes (comment) ?  ?Criminal Activity/Legal Involvement Pertinent to Current Situation/Hospitalization: No - Comment as needed ? ?Activities of Daily Living ?Home Assistive Devices/Equipment: Chana Bode (specify type), Cane (specify quad or straight) ?ADL Screening (condition at time of admission) ?Patient's cognitive ability adequate to safely complete daily activities?: Yes ?Is the patient deaf or have difficulty hearing?: No ?Does the patient have difficulty  seeing, even when wearing glasses/contacts?: Yes ?Does the patient have difficulty concentrating, remembering, or making decisions?: No ?Patient able to express need for assistance with ADLs?: Yes ?Does the patient have difficulty dressing or bathing?: Yes ?Independently performs ADLs?: No ?Communication: Independent ?Dressing (OT): Needs assistance ?Is this a change from baseline?: Pre-admission baseline ?Grooming: Needs assistance ?Is this a change from baseline?: Pre-admission baseline ?Feeding: Independent ?Bathing: Needs assistance ?Is this a change from baseline?: Pre-admission baseline ?Toileting: Independent ?In/Out Bed: Needs assistance ?Is this a change from baseline?: Pre-admission baseline ?Walks in Home: Needs assistance ?Is this a change from baseline?: Pre-admission baseline ?Does the patient have difficulty walking or climbing stairs?: Yes ?Weakness of Legs: Both ?Weakness of Arms/Hands: None ? ?Permission Sought/Granted ?Permission sought to share information with : Case Manager ?Permission granted to share information with : Yes, Verbal Permission Granted ? Share Information with NAME: Case Manager ?   ?   ?   ? ?Emotional Assessment ?Appearance:: Appears stated age ?  ?  ?  ?  ?  ? ?Admission diagnosis:  Cellulitis [L03.90] ?Patient Active Problem List  ? Diagnosis Date Noted  ? Ascites 04/05/2022  ? Cellulitis 04/02/2022  ? Lactic acidosis 04/02/2022  ? Chronic osteomyelitis involving ankle and foot, left ([place]) 11/09/2021  ? Essential hypertension 11/09/2021  ? Hematemesis   ? Liver cirrhosis secondary to NASH ([place]) 05/19/2021  ? Thrombocytopenia ([place]) 05/19/2021  ? Obesity (BMI 30-39.9) 02/15/2021  ? Acquired absence of left foot ([place]) 01/11/2021  ? Non-healing wound of amputation stump ([place]) 09/09/2020  ?  Coronary atherosclerosis 08/04/2020  ? Non-prs chronic ulcer oth prt right foot w fat layer exposed ([place]) 12/24/2019  ? Anemia in chronic kidney disease 12/20/2019  ? Acquired absence of other  left toe(s) ([place]) 11/19/2019  ? Stage 2 chronic kidney disease due to type 2 diabetes mellitus ([place]) 08/01/2019  ? Nonalcoholic fatty liver disease 05/07/2019  ? Type 2 diabetes mellitus treated with insulin ([place]) 05/06/2019  ? Depressive disorder 04/18/2019  ? Generalized anxiety disorder 04/18/2019  ? Seasonal allergic rhinitis 04/18/2019  ? Arthritis 04/18/2019  ? Mixed hyperlipidemia 12/19/2018  ? ?PCP:  Jacqualine Code, DO ?Pharmacy:   ?[id number] [place] ?[place] [address] ?Phone: [phone] Fax: [phone] ? ? ? ? ?Social Determinants of Health (SDOH) Interventions ?  ? ?Readmission Risk Interventions ? ?  11/16/2021  ?  9:27 AM  ?Readmission Risk Prevention Plan  ?Home Care Screening Complete  ?Medication Review (RN CM) Complete  ? ? ? ?

## 2022-04-07 NOTE — Progress Notes (Addendum)
Inpatient Diabetes Program Recommendations ? ?AACE/ADA: New Consensus Statement on Inpatient Glycemic Control (2015) ? ?Target Ranges:  Prepandial:   less than 140 mg/dL ?     Peak postprandial:   less than 180 mg/dL (1-2 hours) ?     Critically ill patients:  140 - 180 mg/dL  ? ?Lab Results  ?Component Value Date  ? GLUCAP 88 04/07/2022  ? HGBA1C 11.2 (H) 04/03/2022  ? ? ?Review of Glycemic Control ? Latest Reference Range & Units 04/07/22 07:29 04/07/22 07:57  ?Glucose-Capillary 70 - 99 mg/dL 46 (L) 88  ?(L): Data is abnormally low ? ?Diabetes history: DM2 ?Outpatient Diabetes medications: Toujeo 68 units QHS, Humalog 6 units TID, Metformin 500 mg BID ?Current orders for Inpatient glycemic control: Semglee 65 units QHS, Novolog 0-15 units TID and 0-5 units QHS, Novolog 4 units TID ? ?Inpatient Diabetes Program Recommendations:   ? ?Hypoglyemic this morning 46 mg/dL.  Lease reduce basal: ? ?Semglee 54 units QHS (80% of home dose) ? ?Will speak with her today about DM regimen and A1C.  Ordered LWWD.   ? ?Spoke with patient at bedside.  Reviewed patient's current A1c of  11.2% (average BG of 272 mg/dL). Explained what a A1c is and what it measures. Also reviewed goal A1c with patient, importance of good glucose control @ home, and blood sugar goals.  She does not administer her insulin regularly.  She states she forgets often.  Discussed importance of administering her insulin as prescribed to help prevent long and short-term complications.   ? ?Denies hypoglycemia at home but is aware of signs, symptoms and treatments.   ? ? ?Will continue to follow while inpatient. ? ?Thank you, ?Reche Dixon, MSN, RN ?Diabetes Coordinator ?Inpatient Diabetes Program ?[phone] (team pager from 8a-5p) ? ? ? ? ? ?

## 2022-04-08 DIAGNOSIS — M86672 Other chronic osteomyelitis, left ankle and foot: Secondary | ICD-10-CM | POA: Diagnosis not present

## 2022-04-08 DIAGNOSIS — Z89432 Acquired absence of left foot: Secondary | ICD-10-CM | POA: Diagnosis not present

## 2022-04-08 LAB — CBC
HCT: 17.8 % — ABNORMAL LOW (ref 36.0–46.0)
Hemoglobin: 6.1 g/dL — CL (ref 12.0–15.0)
MCH: 31.6 pg (ref 26.0–34.0)
MCHC: 34.3 g/dL (ref 30.0–36.0)
MCV: 92.2 fL (ref 80.0–100.0)
Platelets: 77 10*3/uL — ABNORMAL LOW (ref 150–400)
RBC: 1.93 MIL/uL — ABNORMAL LOW (ref 3.87–5.11)
RDW: 18.1 % — ABNORMAL HIGH (ref 11.5–15.5)
WBC: 3.2 10*3/uL — ABNORMAL LOW (ref 4.0–10.5)
nRBC: 0 % (ref 0.0–0.2)

## 2022-04-08 LAB — GLUCOSE, CAPILLARY
Glucose-Capillary: 77 mg/dL (ref 70–99)
Glucose-Capillary: 117 mg/dL — ABNORMAL HIGH (ref 70–99)
Glucose-Capillary: 153 mg/dL — ABNORMAL HIGH (ref 70–99)
Glucose-Capillary: 195 mg/dL — ABNORMAL HIGH (ref 70–99)

## 2022-04-08 LAB — COMPREHENSIVE METABOLIC PANEL
ALT: 18 U/L (ref 0–44)
AST: 32 U/L (ref 15–41)
Albumin: 2 g/dL — ABNORMAL LOW (ref 3.5–5.0)
Alkaline Phosphatase: 51 U/L (ref 38–126)
Anion gap: 5 (ref 5–15)
BUN: 28 mg/dL — ABNORMAL HIGH (ref 8–23)
CO2: 23 mmol/L (ref 22–32)
Calcium: 7.7 mg/dL — ABNORMAL LOW (ref 8.9–10.3)
Chloride: 107 mmol/L (ref 98–111)
Creatinine, Ser: 1.18 mg/dL — ABNORMAL HIGH (ref 0.44–1.00)
GFR, Estimated: 51 mL/min — ABNORMAL LOW (ref >60)
Glucose, Bld: 123 mg/dL — ABNORMAL HIGH (ref 70–99)
Potassium: 3.3 mmol/L — ABNORMAL LOW (ref 3.5–5.1)
Sodium: 135 mmol/L (ref 135–145)
Total Bilirubin: 0.7 mg/dL (ref 0.3–1.2)
Total Protein: 4.8 g/dL — ABNORMAL LOW (ref 6.5–8.1)

## 2022-04-08 LAB — HEMOGLOBIN AND HEMATOCRIT, BLOOD
HCT: 23.1 % — ABNORMAL LOW (ref 36.0–46.0)
Hemoglobin: 8 g/dL — ABNORMAL LOW (ref 12.0–15.0)

## 2022-04-08 LAB — PREPARE RBC (CROSSMATCH)

## 2022-04-08 MED ORDER — DOXYCYCLINE HYCLATE 100 MG PO TABS
100.0000 mg | ORAL_TABLET | Freq: Two times a day (BID) | ORAL | Status: DC
  Administered 2022-04-08 – 2022-04-10 (×6): 100 mg via ORAL
  Filled 2022-04-08 (×6): qty 1

## 2022-04-08 MED ORDER — SODIUM CHLORIDE 0.9% IV SOLUTION: Freq: Once | INTRAVENOUS | Status: AC

## 2022-04-08 MED ORDER — AMOXICILLIN-POT CLAVULANATE 875-125 MG PO TABS
1.0000 | ORAL_TABLET | Freq: Two times a day (BID) | ORAL | Status: DC
  Administered 2022-04-08 – 2022-04-12 (×9): 1 via ORAL
  Filled 2022-04-08 (×9): qty 1

## 2022-04-08 NOTE — TOC Progression Note (Signed)
Transition of Care (TOC) - Progression Note  ? ?Patient Details  ?Name: CHELSA KUSHMAN ?MRN: [id number] ?Date of Birth: Sep 28, 1956 ? ?Transition of Care (TOC) CM/SW Contact  ?Sherie Don, LCSW ?Phone Number: ?04/08/2022, 3:11 PM ? ?Clinical Narrative: CSW expanded [place] search to agencies in [place] and referrals were made to the following agencies: ? ?[facility]: out of service area ?[facility]: out of service area ?[facility]: not accepting new PT patients ?[facility]: unable to reach/no response ?[facility]: referral faxed to branch that covers patient's service area ([phone]), no follow up response ?[facility]: spoke with Tammy and referral was accepted ? ?CSW faxed H&P, med list, and [place] orders to Tammy with [facility] [phone]). CSW updated daughter, Darrick Huntsman, that [facility] has been set up through [facility] and a start date will be early next week. ? ?Expected Discharge Plan: [place] ?Barriers to Discharge: Continued Medical Work up ? ?Expected Discharge Plan and Services ?Expected Discharge Plan: [place] ?Discharge Planning Services: CM Consult ?Post Acute Care Choice: Home Health ?Living arrangements for the past 2 months: [place]           ?DME Arranged: N/A ?DME Agency: NA ?HH Arranged: PT ?[place] Agency: [facility] ([facility] [place] branch) ?Date HH Agency Contacted: 04/08/22 ?Representative spoke with at [place]: [place] ? ?Readmission Risk Interventions ? ?  11/16/2021  ?  9:27 AM  ?Readmission Risk Prevention Plan  ?Home Care Screening Complete  ?Medication Review (RN CM) Complete  ? ?

## 2022-04-08 NOTE — Progress Notes (Signed)
?Progress Note ? ? ?Patient: Ariel Arnold [id number] DOB: [birth year] DOA: 04/02/2022     6 ?DOS: the patient was seen and examined on 04/08/2022 ?  ?Brief hospital course: ?66 y.o. female with medical history significant of aortic atherosclerosis, peripheral vascular disease, type 2 diabetes on insulin, liver cirrhosis secondary to NASH, hypertension, thrombocytopenia, and depression/anxiety.  One week prior to admission, she was experiencing a trail of blood coming from her left foot.  She has history of osteomyelitis previously seen by Dr. March Rummage of the podiatry team.  He performed a transmetatarsal amputation leaving an open wound for drainage.  Chronic osteomyelitis continued to be an issue and she was lost to follow-up over the past 2 months.  She has a history of neuropathy where she cannot feel anything on her feet.  She denies pain, fevers, shortness of breath.  MRI performed on 02/14/22 showed likely persistent osteomyelitis. ? ?4/16 Podiatry seen and evaluated, recommending repeat MRI, antibiotics, possible surgery 4/17. To follow. Patient is clinically stable. If stable changes in wound, consider outpatient management.  ? ?Assessment and Plan: ?Chronic osteomyelitis ?Acute osteomyelitis ?MRI on 4/16 significant for evidence of acute osteomyelitis of distal third and fifth metatarsals, in addition to entire residual fourth metatarsal. ?-Patient had been continued on Zosyn.  Rare diptheroids on 1/2 wound cultures. Discussed with pharmacy, to cont on augmentin with doxy ?-Podiatry had recommended one week of abx to continue on d/c ? ?Foot abscess ?Noted on MRI of left foot. On Zosyn as mentioned above. Currently stable. ?-Continue on above abx ?-Podiatry following.  Patient is now status post transmetatarsal amputation of the left ?-See below, recent postop complications of significant bleeding requiring multiple blood transfusions. Overnight, hgb dropped to 6.1, now receiving 2 more units PRBC's ?-Have  asked Podiatry to re-eval ?  ?Diabetes mellitus, type 2 ?Uncontrolled with hyperglycemia. Hemoglobin A1C of 11.2%. patient is on Humalog, Toujeo as an outpatient.Started on Semglee and SSI while inpatient ?-Continue Semglee ?-Continue SSI ?-Noted to have episode of hypoglycemia to 46 this AM ?  ?Acute on chronic anemia, with acute blood loss anemia ?-Postoperative hemoglobin noted to be in the 5 on the evening of 4/18 ?-Patient is now status post multiple units of PRBCs ?-hgb down to 6.1, dressings appear soaked in blood. Have ordered 2 units PRBC's ?-Will f/u with Podiatry recs ?-Repeat CBC in the morning ?  ?NASH ?Noted. Stable. History of ascites and portal hypertension. Moderate ascites noted on abdominal ultrasound (4/18). Currently asymptomatic. ?-Now on Lasix and spironolactone, started lower dose as patient is concerned about starting Lasix and frequent urination; blood pressure is also low-middle end of normotensive range at times ?  ?Abdominal distension ?Asymptomatic. Likely secondary to ascites. ?  ?Thrombocytopenia ?Plts of 77 ?-repeat cbc in AM ?  ?Depression ?Anxiety ?-Continue Zoloft ?  ?Primary hypertension ?-Continue Coreg ?  ?Hyperlipidemia ?-Continue Pravastatin ?  ?GERD ?-Continue Protonix prn ?  ?  ? ?Subjective: Feels weak this aM ? ?Physical Exam: ?Vitals:  ? 04/08/22 0928 04/08/22 1249 04/08/22 1301 04/08/22 1331  ?BP: (!) 99/51 (!) 97/50 (!) 100/59 (!) 96/52  ?Pulse: 68 70 67 71  ?Resp: 16 16 16 16   ?Temp: 98 ?F (36.7 ?C) 98.2 ?F (36.8 ?C) 98 ?F (36.7 ?C) 98.1 ?F (36.7 ?C)  ?TempSrc: Oral Oral Oral Oral  ?SpO2: 98% 97% 98% 97%  ?Weight:      ?Height:      ? ?General exam: Awake, laying in bed, in nad ?Respiratory system: Normal respiratory effort,  no wheezing ?Cardiovascular system: regular rate, s1, s2 ?Gastrointestinal system: Soft, nondistended, positive BS ?Central nervous system: CN2-12 grossly intact, strength intact ?Extremities: Perfused, no clubbing, LLE dressings soaked in  blood ?Skin: Normal skin turgor, pallor  ?Psychiatry: Mood normal // no visual hallucinations  ? ? ?Data Reviewed: ? ?Labs reviewed: K 3.3, Cr 1.18 ? ?Family Communication:  Pt in room, family at bedside ? ?Disposition: ?Status is: Inpatient ?Remains inpatient appropriate because: Severity of illness ? Planned Discharge Destination: Home ? ? ? ?Author: ?Marylu Lund, MD ?04/08/2022 3:17 PM ? ?For on call review [email or web address].  ?

## 2022-04-08 NOTE — Progress Notes (Signed)
?   04/08/22 0500  ?Notify: Provider  ?Provider Name/Title Dr. Hal Hope  ?Date Provider Notified 04/08/22  ?Time Provider Notified 0500  ?Notification Type Page  ?Notification Reason Critical result ?(hgb-6.1)  ? ? ?

## 2022-04-08 NOTE — Care Management Important Message (Signed)
Important Message ? ?Patient Details IM Letter placed in Patients room. ?Name: Ariel Arnold ?MRN: [id number] ?Date of Birth: 1955-12-22 ? ? ?Medicare Important Message Given:  Yes ? ? ? ? ?Kerin Salen ?04/08/2022, 4:32 PM ?

## 2022-04-08 NOTE — Progress Notes (Signed)
Micro from [facility] called with results of cultures. There was no organism seen and gram positive rod. ?

## 2022-04-08 NOTE — Plan of Care (Signed)

## 2022-04-08 NOTE — Progress Notes (Signed)
?Subjective:  ?Patient ID: Ariel Arnold, female    DOB: 02-23-1956,  MRN: [id number] ? ?Seen this morning at bedside doing well. Dressing intact with no major bleeding noted overnight. No pain.    ? ?Past Medical History:  ?Diagnosis Date  ? Anemia   ? Anxiety   ? Aortic atherosclerosis ([place])   ? Arthritis   ? AVM (arteriovenous malformation) of colon   ? Depression   ? Essential hypertension   ? GI bleeding   ? Recurrent  ? Hepatic encephalopathy ([place])   ? History of kidney stones   ? History of RSV infection   ? Liver cirrhosis secondary to NASH [facility]) 2004  ? Biopsy-proven  ? Obesity   ? Osteomyelitis ([place])   ? Peripheral vascular disease ([place])   ? Thrombocytopenia ([place])   ? Type 2 diabetes mellitus ([place])   ?  ? ?Past Surgical History:  ?Procedure Laterality Date  ? ABDOMINAL HYSTERECTOMY    ? AMPUTATION Left 04/05/2022  ? Procedure: Metatarsal amputation revision;  Surgeon: Lorenda Peck, MD;  Location: [facility];  Service: Podiatry;  Laterality: Left;  Surgical team to do block  ? COLONOSCOPY    ? Cystoscopy with ureteral stent    ? ESOPHAGOGASTRODUODENOSCOPY    ? ESOPHAGOGASTRODUODENOSCOPY (EGD) WITH PROPOFOL N/A 05/20/2021  ? Procedure: ESOPHAGOGASTRODUODENOSCOPY (EGD) WITH PROPOFOL;  Surgeon: Daneil Dolin, MD;  [facility];  Service: Endoscopy;  Laterality: N/A;  needs intubation  ? IR ANGIOGRAM FOLLOW UP STUDY  05/21/2021  ? IR ANGIOGRAM SELECTIVE EACH ADDITIONAL VESSEL  05/21/2021  ? IR EMBO VENOUS NOT HEMORR HEMANG  INC GUIDE ROADMAPPING  05/21/2021  ? IR US GUIDE VASC ACCESS RIGHT  05/21/2021  ? IR VENOGRAM RENAL UNI LEFT  05/21/2021  ? RADIOLOGY WITH ANESTHESIA N/A 05/21/2021  ? Procedure: IR WITH ANESTHESIA;  Surgeon: Radiologist, Medication, MD;  Location: [facility];  Service: Radiology;  Laterality: N/A;  ? Toe amputations Bilateral   ? TUBAL LIGATION    ? WOUND DEBRIDEMENT Left 11/12/2021  ? Procedure: DEBRIDEMENT WOUND;FIRST METATARSAL RESECTION; APPLICATION OF SKIN GRAFT SUBSTITUTE;  Surgeon: Evelina Bucy, DPM;  Location: [facility];  Service: Podiatry;  Laterality: Left;  ? ? ? ?  Latest Ref Rng & Units 04/08/2022  ?  4:26 AM 04/06/2022  ?  8:23 PM 04/06/2022  ? 10:00 AM  ?CBC  ?WBC 4.0 - 10.5 K/uL 3.2   8.5   8.7    ?Hemoglobin 12.0 - 15.0 g/dL 6.1   8.0   9.1    ? 9.1    ?Hematocrit 36.0 - 46.0 % 17.8   23.0   26.2    ? 26.5    ?Platelets 150 - 400 K/uL 77   124   127    ? ? ? ?  Latest Ref Rng & Units 04/08/2022  ?  4:26 AM 04/06/2022  ?  8:23 PM 04/05/2022  ?  5:25 AM  ?BMP  ?Glucose 70 - 99 mg/dL 123   143   168    ?BUN 8 - 23 mg/dL 28   25   18     ?Creatinine 0.44 - 1.00 mg/dL 1.18   1.17   0.83    ?Sodium 135 - 145 mmol/L 135   136   138    ?Potassium 3.5 - 5.1 mmol/L 3.3   3.8   3.3    ?Chloride 98 - 111 mmol/L 107   107   109    ?CO2  22 - 32 mmol/L 23   25   25     ?Calcium 8.9 - 10.3 mg/dL 7.7   7.8   7.9    ? ? ? ?Objective:  ? ?Vitals:  ? 04/08/22 1331 04/08/22 1628  ?BP: (!) 96/52 120/60  ?Pulse: 71 73  ?Resp: 16 15  ?Temp: 98.1 ?F (36.7 ?C) 98.2 ?F (36.8 ?C)  ?SpO2: 97% 97%  ? ? ?General:AA&O x 3. Normal mood and affect  ? ?Vascular: DP and PT pulses 2/4 bilateral. Brisk capillary refill to all digits. Pedal hair present  ? ?Neruological. Epicritic sensation grossly intact.  ? ?Derm: No strike through of dressing today. Clean dry and intact.Left in place this morning.  ? ?MSK: MMT 5/5 in dorsiflexion, plantar flexion, inversion and eversion. Normal joint ROM without pain or crepitus.  ? ? ?Assessment & Plan:  ?Patient was evaluated and treated and all questions answered. ? ?DX: Left foot wound/osteomyelitis  ?Wound care: Will leave dressing intact until follow-up outpatient.  ?Antibiotics: Per primary cultures Potentially gram positive rod growing suggest ID consult for abx guidance.  ?DME: Post-op shoe  Will be NWB.  ?Discussed with patient diagnosis and treatment options.  ?Discussed with patient options . Dressing changes and no active bleeding from incision site appears well coapted.  Bleeding is  under control and will monitor Hg.Marland Kitchen Patient will be NWB for the time. Patient  ok for discharge from podiatry standpoint. Patient will follow-up in podiatry clinic.  ? ?Lorenda Peck, MD ? ?Accessible via secure chat for questions or concerns. ? ?

## 2022-04-08 NOTE — Progress Notes (Signed)
PT Cancellation Note ? ?Patient Details ?Name: Ariel Arnold ?MRN: [id number] ?DOB: 1956-01-11 ? ? ?Cancelled Treatment:     HgB 6.1 well below parameters to attempt Physical Therapy.  Pt has been evaluated with rec to return home.  Will have daughter to assist.  Will place on schedule to be seen tomorrow for stair training as pt is NWBing. ? ? ? ?Felecia Shelling  PTA ?Acute  Rehabilitation Services ?Pager      [phone] ?Office      [phone] ? ?

## 2022-04-09 DIAGNOSIS — Z89432 Acquired absence of left foot: Secondary | ICD-10-CM | POA: Diagnosis not present

## 2022-04-09 LAB — COMPREHENSIVE METABOLIC PANEL
ALT: 18 U/L (ref 0–44)
AST: 34 U/L (ref 15–41)
Albumin: 2 g/dL — ABNORMAL LOW (ref 3.5–5.0)
Alkaline Phosphatase: 66 U/L (ref 38–126)
Anion gap: 3 — ABNORMAL LOW (ref 5–15)
BUN: 29 mg/dL — ABNORMAL HIGH (ref 8–23)
CO2: 25 mmol/L (ref 22–32)
Calcium: 7.6 mg/dL — ABNORMAL LOW (ref 8.9–10.3)
Chloride: 106 mmol/L (ref 98–111)
Creatinine, Ser: 1.09 mg/dL — ABNORMAL HIGH (ref 0.44–1.00)
GFR, Estimated: 56 mL/min — ABNORMAL LOW (ref >60)
Glucose, Bld: 169 mg/dL — ABNORMAL HIGH (ref 70–99)
Potassium: 3.4 mmol/L — ABNORMAL LOW (ref 3.5–5.1)
Sodium: 134 mmol/L — ABNORMAL LOW (ref 135–145)
Total Bilirubin: 1.4 mg/dL — ABNORMAL HIGH (ref 0.3–1.2)
Total Protein: 5.3 g/dL — ABNORMAL LOW (ref 6.5–8.1)

## 2022-04-09 LAB — BPAM RBC
Blood Product Expiration Date: 202305032359
Blood Product Expiration Date: 202305042359
Blood Product Expiration Date: 202305042359
Blood Product Expiration Date: 202305042359
Blood Product Expiration Date: 202305042359
ISSUE DATE / TIME: 202304182151
ISSUE DATE / TIME: 202304190032
ISSUE DATE / TIME: 202304190429
ISSUE DATE / TIME: 202304210907
ISSUE DATE / TIME: 202304211308
Unit Type and Rh: 6200
Unit Type and Rh: 6200
Unit Type and Rh: 6200
Unit Type and Rh: 6200
Unit Type and Rh: 6200

## 2022-04-09 LAB — CULTURE, BLOOD (ROUTINE X 2)
Culture: NO GROWTH
Culture: NO GROWTH
Special Requests: ADEQUATE
Special Requests: ADEQUATE

## 2022-04-09 LAB — TYPE AND SCREEN
ABO/RH(D): A POS
Antibody Screen: NEGATIVE
Unit division: 0
Unit division: 0
Unit division: 0
Unit division: 0
Unit division: 0

## 2022-04-09 LAB — CBC
HCT: 23.4 % — ABNORMAL LOW (ref 36.0–46.0)
Hemoglobin: 7.9 g/dL — ABNORMAL LOW (ref 12.0–15.0)
MCH: 30.5 pg (ref 26.0–34.0)
MCHC: 33.8 g/dL (ref 30.0–36.0)
MCV: 90.3 fL (ref 80.0–100.0)
Platelets: 70 10*3/uL — ABNORMAL LOW (ref 150–400)
RBC: 2.59 MIL/uL — ABNORMAL LOW (ref 3.87–5.11)
RDW: 18.9 % — ABNORMAL HIGH (ref 11.5–15.5)
WBC: 3 10*3/uL — ABNORMAL LOW (ref 4.0–10.5)
nRBC: 0 % (ref 0.0–0.2)

## 2022-04-09 LAB — GLUCOSE, CAPILLARY
Glucose-Capillary: 200 mg/dL — ABNORMAL HIGH (ref 70–99)
Glucose-Capillary: 109 mg/dL — ABNORMAL HIGH (ref 70–99)
Glucose-Capillary: 137 mg/dL — ABNORMAL HIGH (ref 70–99)
Glucose-Capillary: 168 mg/dL — ABNORMAL HIGH (ref 70–99)

## 2022-04-09 LAB — HEMOGLOBIN AND HEMATOCRIT, BLOOD
HCT: 26.2 % — ABNORMAL LOW (ref 36.0–46.0)
Hemoglobin: 8.7 g/dL — ABNORMAL LOW (ref 12.0–15.0)

## 2022-04-09 MED ORDER — ZOLPIDEM TARTRATE 5 MG PO TABS: 5.0000 mg | ORAL_TABLET | Freq: Every evening | ORAL | Status: DC | PRN

## 2022-04-09 MED ORDER — ACETAMINOPHEN 325 MG PO TABS
650.0000 mg | ORAL_TABLET | Freq: Once | ORAL | Status: AC
  Administered 2022-04-09: 650 mg via ORAL
  Filled 2022-04-09: qty 2

## 2022-04-09 NOTE — Progress Notes (Signed)
Patient's dressing reinforced due to drainage. ?

## 2022-04-09 NOTE — Progress Notes (Signed)
?Progress Note ? ? ?Patient: Ariel Arnold [id number] DOB: Aug 31, 1956 DOA: 04/02/2022     7 ?DOS: the patient was seen and examined on 04/09/2022 ?  ?Brief hospital course: ?66 y.o. female with medical history significant of aortic atherosclerosis, peripheral vascular disease, type 2 diabetes on insulin, liver cirrhosis secondary to NASH, hypertension, thrombocytopenia, and depression/anxiety.  One week prior to admission, she was experiencing a trail of blood coming from her left foot.  She has history of osteomyelitis previously seen by Dr. March Rummage of the podiatry team.  He performed a transmetatarsal amputation leaving an open wound for drainage.  Chronic osteomyelitis continued to be an issue and she was lost to follow-up over the past 2 months.  She has a history of neuropathy where she cannot feel anything on her feet.  She denies pain, fevers, shortness of breath.  MRI performed on 02/14/22 showed likely persistent osteomyelitis. ? ?4/16 Podiatry seen and evaluated, recommending repeat MRI, antibiotics, possible surgery 4/17. To follow. Patient is clinically stable. If stable changes in wound, consider outpatient management.  ? ?Assessment and Plan: ?Chronic osteomyelitis ?Acute osteomyelitis ?MRI on 4/16 significant for evidence of acute osteomyelitis of distal third and fifth metatarsals, in addition to entire residual fourth metatarsal. ?-Patient had been continued on Zosyn.  Rare diptheroids on 1/2 wound cultures. Discussed with pharmacy, to cont on augmentin with doxy ?-Podiatry had recommended one week of abx to continue on d/c ? ?Foot abscess ?Noted on MRI of left foot. On Zosyn as mentioned above. Currently stable. ?-Continue on above abx ?-Podiatry following.  Patient is now status post transmetatarsal amputation of the left ?-See below, recent postop complications of significant bleeding requiring multiple blood transfusions, most recent being 2 units on 4/21 for hgb of 6.1 ?-Podiatry continues to  follow ?-Hgb 7.9 this AM. Will repeat h/h this afternoon ?  ?Diabetes mellitus, type 2 ?Uncontrolled with hyperglycemia. Hemoglobin A1C of 11.2%. patient is on Humalog, Toujeo as an outpatient.Started on [place] and SSI while inpatient ?-Continue Semglee ?-Continue SSI ?  ?Acute on chronic anemia, with acute blood loss anemia ?-Postoperative hemoglobin noted to be in the 5 on the evening of 4/18 ?-Patient is now status post multiple units of PRBCs ?-hgb down to 6.1 on 4/21, requiring 2 more units PRBC's ?-Will f/u with Podiatry recs ?-Hgb 7.9 this AM, will repeat h/h this afternoon ?  ?NASH ?Noted. Stable. History of ascites and portal hypertension. Moderate ascites noted on abdominal ultrasound (4/18). Currently asymptomatic. ?-continued on Lasix and spironolactone, at lower dose ?  ?Abdominal distension ?Asymptomatic. Likely secondary to ascites. ?  ?Thrombocytopenia ?Plts of 77 ?-repeat cbc in AM ?  ?Depression ?Anxiety ?-Continue Zoloft ?  ?Primary hypertension ?-Continue Coreg ?  ?Hyperlipidemia ?-Continue Pravastatin ?  ?GERD ?-Continue Protonix prn ?  ?  ? ?Subjective: Without complaints this AM ? ?Physical Exam: ?Vitals:  ? 04/08/22 1628 04/08/22 2058 04/09/22 0546 04/09/22 1315  ?BP: 120/60 (!) 100/51 (!) 103/46 111/63  ?Pulse: 73 69 70 66  ?Resp: 15 20 20 14   ?Temp: 98.2 ?F (36.8 ?C) 98.5 ?F (36.9 ?C) 98.1 ?F (36.7 ?C) 98 ?F (36.7 ?C)  ?TempSrc: Oral Oral Oral Oral  ?SpO2: 97% 98% 98% 97%  ?Weight:      ?Height:      ? ?General exam: Conversant, in no acute distress ?Respiratory system: normal chest rise, clear, no audible wheezing ?Cardiovascular system: regular rhythm, s1-s2 ?Gastrointestinal system: Nondistended, nontender, pos BS ?Central nervous system: No seizures, no tremors ?Extremities: No cyanosis,  no joint deformities, LLE with dressings in place ?Skin: No rashes, no pallor ?Psychiatry: Affect normal // no auditory hallucinations  ? ? ?Data Reviewed: ? ?Labs reviewed: K 3.4, Cr 1.09 ? ?Family  Communication:  Pt in room, family currently not at bedside ? ?Disposition: ?Status is: Inpatient ?Remains inpatient appropriate because: Severity of illness ? Planned Discharge Destination: Home ? ? ? ?Author: ?Marylu Lund, MD ?04/09/2022 2:00 PM ? ?For on call review [email or web address]  ?

## 2022-04-09 NOTE — Progress Notes (Signed)
Physical Therapy Treatment ?Patient Details ?Name: Ariel Arnold ?MRN: [id number] ?DOB: 1956/10/26 ?Today's Date: 04/09/2022 ? ? ?History of Present Illness Patient is 66 y.o. female presented to ED for Lt transmet residual limb wound with malodor. MRI on 4/16 significant for evidence of acute osteomyelitis of distal third and fifth metatarsals, in addition to entire residual fourth metatarsal. Patient now s/p Lt transmetatarsal amputation revision on [id number] complicated by significant blood loss that night requiring 3 units PRBC since surgery. PMH significant of aortic atherosclerosis, peripheral vascular disease, type 2 diabetes on insulin, liver cirrhosis secondary to NASH, hypertension, thrombocytopenia, and depression/anxiety. ? ?  ?PT Comments  ? ? Mod assist for supine to sit. Min to mod assist for sit to stand. In standing with RW pt was unable to pivot on RLE to transfer to a recliner, so returned to sitting at EOB and performed pivot with Stedy lift equipment. Pt's bed was saturated in urine (she was on a purewick). She was able to stand in [place] for ~2 minutes for pericare. NT notified of need for linen change. PT now recommending ST-SNF.  ?   ?Recommendations for follow up therapy are one component of a multi-disciplinary discharge planning process, led by the attending physician.  Recommendations may be updated based on patient status, additional functional criteria and insurance authorization. ? ?Follow Up Recommendations ? Skilled nursing-short term rehab (<3 hours/day) ?  ?  ?Assistance Recommended at Discharge Frequent or constant Supervision/Assistance  ?Patient can return home with the following A lot of help with walking and/or transfers;A lot of help with bathing/dressing/bathroom;Assistance with cooking/housework;Direct supervision/assist for medications management;Direct supervision/assist for financial management;Assist for transportation;Help with stairs or ramp for entrance ?  ?Equipment  Recommendations ? None recommended by PT  ?  ?Recommendations for Other Services   ? ? ?  ?Precautions / Restrictions Precautions ?Precautions: Fall ?Precaution Comments: when asked if she's had falls in the past 6 months, pt stated, "I probably have". Pt is vague historian. ?Restrictions ?Weight Bearing Restrictions: Yes ?RUE Weight Bearing: Weight bearing as tolerated ?LUE Weight Bearing: Weight bearing as tolerated ?LLE Weight Bearing: Non weight bearing  ?  ? ?Mobility ? Bed Mobility ?Overal bed mobility: Needs Assistance ?Bed Mobility: Supine to Sit ?  ?  ?Supine to sit: Mod assist, HOB elevated ?  ?  ?General bed mobility comments: increased time/effort. Mod A to raise trunk. VCs for technique. ?  ? ?Transfers ?Overall transfer level: Needs assistance ?Equipment used: Rolling walker (2 wheels) ?Transfers: Sit to/from Stand ?Sit to Stand: Mod assist ?Stand pivot transfers: From elevated surface, Total assist ?  ?  ?  ?  ?General transfer comment: mod A to power up from lowered bed, min A to power up from elevated bed. In standing with RW pt was unable to pivot on RLE to get to recliner. So returned to sitting at EOB and used Stedy for SPT to recliner. Bed was saturated in urine, pt is on a purewick. NT notified of need for linen change. Pt able to stand in [place] for ~2 minutes for pericare. Pt stated she was maintaining LLE NWB, difficult to truly assess this due to very bulky dressing on L foot. ?Transfer via Lift Equipment: Stedy ? ?Ambulation/Gait ?  ?  ?  ?  ?  ?  ?  ?  ? ? ?Stairs ?  ?  ?  ?  ?  ? ? ?Wheelchair Mobility ?  ? ?Modified Rankin (Stroke Patients Only) ?  ? ? ?  ?  Balance Overall balance assessment: Needs assistance ?Sitting-balance support: Feet supported, Bilateral upper extremity supported ?Sitting balance-Leahy Scale: Fair ?  ?  ?Standing balance support: Reliant on assistive device for balance, Bilateral upper extremity supported ?Standing balance-Leahy Scale: Poor ?  ?  ?  ?  ?  ?  ?  ?  ?   ?  ?  ?  ?  ? ?  ?Cognition Arousal/Alertness: Awake/alert ?Behavior During Therapy: [facility] for tasks assessed/performed, Flat affect ?Overall Cognitive Status: Within Functional Limits for tasks assessed ?  ?  ?  ?  ?  ?  ?  ?  ?  ?  ?  ?  ?  ?  ?  ?  ?  ?  ?  ? ?  ?Exercises   ? ?  ?General Comments   ?  ?  ? ?Pertinent Vitals/Pain Pain Assessment ?Pain Assessment: No/denies pain  ? ? ?Home Living   ?  ?  ?  ?  ?  ?  ?  ?  ?  ?   ?  ?Prior Function    ?  ?  ?   ? ?PT Goals (current goals can now be found in the care plan section) Acute Rehab PT Goals ?Patient Stated Goal: feel better and get home ?PT Goal Formulation: With patient ?Time For Goal Achievement: 04/21/22 ?Potential to Achieve Goals: Good ?Progress towards PT goals: Progressing toward goals ? ?  ?Frequency ? ? ? Min 2X/week ? ? ? ?  ?PT Plan Discharge plan needs to be updated  ? ? ?Co-evaluation   ?  ?  ?  ?  ? ?  ?AM-PAC PT "6 Clicks" Mobility   ?Outcome Measure ? Help needed turning from your back to your side while in a flat bed without using bedrails?: A Little ?Help needed moving from lying on your back to sitting on the side of a flat bed without using bedrails?: A Lot ?Help needed moving to and from a bed to a chair (including a wheelchair)?: Total ?Help needed standing up from a chair using your arms (e.g., wheelchair or bedside chair)?: A Lot ?Help needed to walk in hospital room?: Total ?Help needed climbing 3-5 steps with a railing? : Total ?6 Click Score: 10 ? ?  ?End of Session Equipment Utilized During Treatment: Gait belt ?Activity Tolerance: Patient tolerated treatment well ?Patient left: in chair;with call bell/phone within reach ?Nurse Communication: Mobility status;Need for lift equipment ?PT Visit Diagnosis: Unsteadiness on feet (R26.81);Muscle weakness (generalized) (M62.81);Difficulty in walking, not elsewhere classified (R26.2) ?  ? ? ?Time: [id number] ?PT Time Calculation (min) (ACUTE ONLY): 30 min ? ?Charges:  $Therapeutic  Activity: 23-37 mins          ?          ?Blondell Reveal Kistler PT 04/09/2022  ?Acute Rehabilitation Services ?Pager [phone] ?Office [phone] ? ? ?

## 2022-04-09 NOTE — Progress Notes (Signed)
?Subjective:  ?Patient ID: Ariel Arnold, female    DOB: [birth year],  MRN: [id number] ? ?Seen this morning at bedside doing well. Dressing intact with no major bleeding noted overnight. No pain.    ? ?Past Medical History:  ?Diagnosis Date  ? Anemia   ? Anxiety   ? Aortic atherosclerosis ([place])   ? Arthritis   ? AVM (arteriovenous malformation) of colon   ? Depression   ? Essential hypertension   ? GI bleeding   ? Recurrent  ? Hepatic encephalopathy ([place])   ? History of kidney stones   ? History of RSV infection   ? Liver cirrhosis secondary to NASH [facility]) 2004  ? Biopsy-proven  ? Obesity   ? Osteomyelitis ([place])   ? Peripheral vascular disease ([place])   ? Thrombocytopenia ([place])   ? Type 2 diabetes mellitus ([place])   ?  ? ?Past Surgical History:  ?Procedure Laterality Date  ? ABDOMINAL HYSTERECTOMY    ? AMPUTATION Left 04/05/2022  ? Procedure: Metatarsal amputation revision;  Surgeon: Lorenda Peck, MD;  Location: [facility];  Service: Podiatry;  Laterality: Left;  Surgical team to do block  ? COLONOSCOPY    ? Cystoscopy with ureteral stent    ? ESOPHAGOGASTRODUODENOSCOPY    ? ESOPHAGOGASTRODUODENOSCOPY (EGD) WITH PROPOFOL N/A 05/20/2021  ? Procedure: ESOPHAGOGASTRODUODENOSCOPY (EGD) WITH PROPOFOL;  Surgeon: Daneil Dolin, MD;  [facility];  Service: Endoscopy;  Laterality: N/A;  needs intubation  ? IR ANGIOGRAM FOLLOW UP STUDY  05/21/2021  ? IR ANGIOGRAM SELECTIVE EACH ADDITIONAL VESSEL  05/21/2021  ? IR EMBO VENOUS NOT HEMORR HEMANG  INC GUIDE ROADMAPPING  05/21/2021  ? IR US GUIDE VASC ACCESS RIGHT  05/21/2021  ? IR VENOGRAM RENAL UNI LEFT  05/21/2021  ? RADIOLOGY WITH ANESTHESIA N/A 05/21/2021  ? Procedure: IR WITH ANESTHESIA;  Surgeon: Radiologist, Medication, MD;  Location: [place];  Service: Radiology;  Laterality: N/A;  ? Toe amputations Bilateral   ? TUBAL LIGATION    ? WOUND DEBRIDEMENT Left 11/12/2021  ? Procedure: DEBRIDEMENT WOUND;FIRST METATARSAL RESECTION; APPLICATION OF SKIN GRAFT SUBSTITUTE;  Surgeon: Evelina Bucy, DPM;  Location: [facility];  Service: Podiatry;  Laterality: Left;  ? ? ? ?  Latest Ref Rng & Units 04/09/2022  ?  4:14 AM 04/08/2022  ?  6:08 PM 04/08/2022  ?  4:26 AM  ?CBC  ?WBC 4.0 - 10.5 K/uL 3.0    3.2    ?Hemoglobin 12.0 - 15.0 g/dL 7.9   8.0   6.1    ?Hematocrit 36.0 - 46.0 % 23.4   23.1   17.8    ?Platelets 150 - 400 K/uL 70    77    ? ? ? ?  Latest Ref Rng & Units 04/09/2022  ?  4:14 AM 04/08/2022  ?  4:26 AM 04/06/2022  ?  8:23 PM  ?BMP  ?Glucose 70 - 99 mg/dL 169   123   143    ?BUN 8 - 23 mg/dL 29   28   25     ?Creatinine 0.44 - 1.00 mg/dL 1.09   1.18   1.17    ?Sodium 135 - 145 mmol/L 134   135   136    ?Potassium 3.5 - 5.1 mmol/L 3.4   3.3   3.8    ?Chloride 98 - 111 mmol/L 106   107   107    ?CO2 22 - 32 mmol/L 25   23   25     ?  Calcium 8.9 - 10.3 mg/dL 7.6   7.7   7.8    ? ? ? ?Objective:  ? ?Vitals:  ? 04/08/22 2058 04/09/22 0546  ?BP: (!) 100/51 (!) 103/46  ?Pulse: 69 70  ?Resp: 20 20  ?Temp: 98.5 ?F (36.9 ?C) 98.1 ?F (36.7 ?C)  ?SpO2: 98% 98%  ? ? ?General:AA&O x 3. Normal mood and affect  ? ?Vascular: DP and PT pulses 2/4 bilateral. Brisk capillary refill to all digits. Pedal hair present  ? ?Neruological. Epicritic sensation grossly intact.  ? ?Derm: No strike through of dressing today. Clean dry and intact.Left in place this morning.  ? ?MSK: MMT 5/5 in dorsiflexion, plantar flexion, inversion and eversion. Normal joint ROM without pain or crepitus.  ? ? ?Assessment & Plan:  ?Patient was evaluated and treated and all questions answered. ? ?DX: Left foot wound/osteomyelitis  ?Wound care: Will leave dressing intact until follow-up outpatient.  ?Antibiotics: Cultures  gram positive rod growing suggest ID consult for abx guidance.  ?DME: Post-op shoe  Will be NWB.  ?Discussed with patient diagnosis and treatment options.  ?Discussed with patient options . Dressing changes and no active bleeding from incision site appears well coapted.  Bleeding is under control and will monitor Hg.Marland Kitchen Patient will  be NWB for the time. Patient  ok for discharge from podiatry standpoint pending antibiotics. . Patient will follow-up in podiatry clinic.  ? ?Lorenda Peck, MD ? ?Accessible via secure chat for questions or concerns. ? ?

## 2022-04-10 DIAGNOSIS — Z89432 Acquired absence of left foot: Secondary | ICD-10-CM | POA: Diagnosis not present

## 2022-04-10 DIAGNOSIS — M86672 Other chronic osteomyelitis, left ankle and foot: Secondary | ICD-10-CM | POA: Diagnosis not present

## 2022-04-10 LAB — AEROBIC/ANAEROBIC CULTURE W GRAM STAIN (SURGICAL/DEEP WOUND)

## 2022-04-10 LAB — COMPREHENSIVE METABOLIC PANEL
ALT: 18 U/L (ref 0–44)
AST: 36 U/L (ref 15–41)
Albumin: 2.1 g/dL — ABNORMAL LOW (ref 3.5–5.0)
Alkaline Phosphatase: 73 U/L (ref 38–126)
Anion gap: 4 — ABNORMAL LOW (ref 5–15)
BUN: 27 mg/dL — ABNORMAL HIGH (ref 8–23)
CO2: 25 mmol/L (ref 22–32)
Calcium: 8.1 mg/dL — ABNORMAL LOW (ref 8.9–10.3)
Chloride: 108 mmol/L (ref 98–111)
Creatinine, Ser: 1.08 mg/dL — ABNORMAL HIGH (ref 0.44–1.00)
GFR, Estimated: 56 mL/min — ABNORMAL LOW (ref >60)
Glucose, Bld: 204 mg/dL — ABNORMAL HIGH (ref 70–99)
Potassium: 3.6 mmol/L (ref 3.5–5.1)
Sodium: 137 mmol/L (ref 135–145)
Total Bilirubin: 1.2 mg/dL (ref 0.3–1.2)
Total Protein: 5.5 g/dL — ABNORMAL LOW (ref 6.5–8.1)

## 2022-04-10 LAB — GLUCOSE, CAPILLARY
Glucose-Capillary: 167 mg/dL — ABNORMAL HIGH (ref 70–99)
Glucose-Capillary: 160 mg/dL — ABNORMAL HIGH (ref 70–99)
Glucose-Capillary: 121 mg/dL — ABNORMAL HIGH (ref 70–99)
Glucose-Capillary: 165 mg/dL — ABNORMAL HIGH (ref 70–99)

## 2022-04-10 LAB — CBC
HCT: 24.2 % — ABNORMAL LOW (ref 36.0–46.0)
Hemoglobin: 8.1 g/dL — ABNORMAL LOW (ref 12.0–15.0)
MCH: 31 pg (ref 26.0–34.0)
MCHC: 33.5 g/dL (ref 30.0–36.0)
MCV: 92.7 fL (ref 80.0–100.0)
Platelets: 83 K/uL — ABNORMAL LOW (ref 150–400)
RBC: 2.61 MIL/uL — ABNORMAL LOW (ref 3.87–5.11)
RDW: 19.4 % — ABNORMAL HIGH (ref 11.5–15.5)
WBC: 3.5 K/uL — ABNORMAL LOW (ref 4.0–10.5)
nRBC: 0 % (ref 0.0–0.2)

## 2022-04-10 NOTE — TOC Progression Note (Signed)
Transition of Care (TOC) - Progression Note  ? ? ?Patient Details  ?Name: [name] ?MRN: [id number] ?Date of Birth: [birth year] ? ?Transition of Care (TOC) CM/SW Contact  ?Cecille Po, RN ?Phone Number: ?04/10/2022, 3:28 PM ? ?Clinical Narrative:    ? ?PT now recommending a short term SNF stay for rehab.  Called patient in her room.  She states she has been to rehab before, about 3 years ago.  States she would prefer to stay at a SNF closer to home, which is [place], [place].   ?FL2 sent today.  Pending bed offers.  ?

## 2022-04-10 NOTE — Progress Notes (Signed)
?Progress Note ? ? ?Patient: Ariel Arnold [id number] DOB: 30-Dec-1955 DOA: 04/02/2022     8 ?DOS: the patient was seen and examined on 04/10/2022 ?  ?Brief hospital course: ?66 y.o. female with medical history significant of aortic atherosclerosis, peripheral vascular disease, type 2 diabetes on insulin, liver cirrhosis secondary to NASH, hypertension, thrombocytopenia, and depression/anxiety.  One week prior to admission, she was experiencing a trail of blood coming from her left foot.  She has history of osteomyelitis previously seen by Dr. March Rummage of the podiatry team.  He performed a transmetatarsal amputation leaving an open wound for drainage.  Chronic osteomyelitis continued to be an issue and she was lost to follow-up over the past 2 months.  She has a history of neuropathy where she cannot feel anything on her feet.  She denies pain, fevers, shortness of breath.  MRI performed on 02/14/22 showed likely persistent osteomyelitis. ? ?4/16 Podiatry seen and evaluated, recommending repeat MRI, antibiotics, possible surgery 4/17. To follow. Patient is clinically stable. If stable changes in wound, consider outpatient management.  ? ?Assessment and Plan: ?Chronic osteomyelitis ?Acute osteomyelitis ?MRI on 4/16 significant for evidence of acute osteomyelitis of distal third and fifth metatarsals, in addition to entire residual fourth metatarsal. ?-Patient had been continued on Zosyn.  Rare diptheroids on 1/2 wound cultures. Discussed with pharmacy, to cont on augmentin with doxy ?-Podiatry had recommended one week of abx to continue on d/c ? ?Foot abscess ?Noted on MRI of left foot. On Zosyn as mentioned above. Currently stable. ?-Continue on above abx ?-Podiatry following.  Patient is now status post transmetatarsal amputation of the left ?-See below, recent postop complications of significant bleeding requiring multiple blood transfusions, most recent being 2 units on 4/21 for hgb of 6.1 ?-Podiatry continues to  follow ?-Hgb 8.1 this AM, stable ?  ?Diabetes mellitus, type 2 ?Uncontrolled with hyperglycemia. Hemoglobin A1C of 11.2%. patient is on Humalog, [place] as an outpatient.Started on [place] and SSI while inpatient ?-Continue Semglee ?-Continue SSI ?  ?Acute on chronic anemia, with acute blood loss anemia ?-Postoperative hemoglobin noted to be in the 5 on the evening of 4/18 ?-Patient is now status post multiple units of PRBCs ?-hgb down to 6.1 on 4/21, requiring 2 more units PRBC's ?-Will f/u with Podiatry recs ?-Hgb stable at 8.1 this AM ?  ?NASH ?Noted. Stable. History of ascites and portal hypertension. Moderate ascites noted on abdominal ultrasound (4/18). Currently asymptomatic. ?-continued on Lasix and spironolactone, at lower dose ?  ?Abdominal distension ?Asymptomatic. Likely secondary to ascites. ?  ?Thrombocytopenia ?Plts of 83 ?-repeat cbc in AM ?  ?Depression ?Anxiety ?-Continue Zoloft ?  ?Primary hypertension ?-Continue Coreg ?  ?Hyperlipidemia ?-Continue Pravastatin ?  ?GERD ?-Continue Protonix prn ?  ?  ? ?Subjective: Reports feeling weaker today ? ?Physical Exam: ?Vitals:  ? 04/09/22 0546 04/09/22 1315 04/09/22 2055 04/10/22 0543  ?BP: (!) 103/46 111/63 (!) 128/59 (!) 125/57  ?Pulse: 70 66 67 74  ?Resp: 20 14 18 16   ?Temp: 98.1 ?F (36.7 ?C) 98 ?F (36.7 ?C) 98.1 ?F (36.7 ?C) 98.4 ?F (36.9 ?C)  ?TempSrc: Oral Oral Oral Oral  ?SpO2: 98% 97% 99% 96%  ?Weight:      ?Height:      ? ?General exam: Awake, laying in bed, in nad ?Respiratory system: Normal respiratory effort, no wheezing ?Cardiovascular system: regular rate, s1, s2 ?Gastrointestinal system: Soft, nondistended, positive BS ?Central nervous system: CN2-12 grossly intact, strength intact ?Extremities: Perfused, no clubbing ?Skin: Normal skin turgor,  no notable skin lesions seen ?Psychiatry: Mood normal // no visual hallucinations  ? ?Data Reviewed: ? ?Labs reviewed: K 3.6, Cr 1.08 ? ?Family Communication:  Pt in room, family currently not at  bedside ? ?Disposition: ?Status is: Inpatient ?Remains inpatient appropriate because: Severity of illness ? Planned Discharge Destination: Home ? ? ? ?Author: ?Marylu Lund, MD ?04/10/2022 1:20 PM ? ?For on call review [email or web address].  ?

## 2022-04-10 NOTE — [place] FL2 (Signed)
?[place] MEDICAID FL2 LEVEL OF CARE SCREENING TOOL  ?  ? ?IDENTIFICATION  ?Patient Name: ?Ariel Arnold Birthdate: 07-Oct-1956 Sex: female Admission Date (Current Location): ?04/02/2022  ?[place] and [place] Number: ? [place] ?  Facility and Address:  ?[facility],  [place], [place] ?     Provider Number: ?[id number]  ?Attending Physician Name and Address:  ?[name], MD ? Relative Name and Phone Number:  ?Darrick Huntsman (Daughter)   [phone] ?   ?Current Level of Care: ?Hospital Recommended Level of Care: ?[place] Prior Approval Number: ?  ? ?Date Approved/Denied: ?  PASRR Number: ?  ? ?Discharge Plan: ?SNF ?  ? ?Current Diagnoses: ?Patient Active Problem List  ? Diagnosis Date Noted  ? Ascites 04/05/2022  ? Cellulitis 04/02/2022  ? Lactic acidosis 04/02/2022  ? Chronic osteomyelitis involving ankle and foot, left ([place]) 11/09/2021  ? Essential hypertension 11/09/2021  ? Hematemesis   ? Liver cirrhosis secondary to NASH ([place]) 05/19/2021  ? Thrombocytopenia ([place]) 05/19/2021  ? Obesity (BMI 30-39.9) 02/15/2021  ? Acquired absence of left foot ([place]) 01/11/2021  ? Non-healing wound of amputation stump ([place]) 09/09/2020  ? Coronary atherosclerosis 08/04/2020  ? Non-prs chronic ulcer oth prt right foot w fat layer exposed ([place]) 12/24/2019  ? Anemia in chronic kidney disease 12/20/2019  ? Acquired absence of other left toe(s) ([place]) 11/19/2019  ? Stage 2 chronic kidney disease due to type 2 diabetes mellitus ([place]) 08/01/2019  ? Nonalcoholic fatty liver disease 05/07/2019  ? Type 2 diabetes mellitus treated with insulin ([place]) 05/06/2019  ? Depressive disorder 04/18/2019  ? Generalized anxiety disorder 04/18/2019  ? Seasonal allergic rhinitis 04/18/2019  ? Arthritis 04/18/2019  ? Mixed hyperlipidemia 12/19/2018  ? ? ?Orientation RESPIRATION BLADDER Height & Weight   ?  ?Self, Time, Situation, Place ? Normal External catheter Weight: 83.9 kg ?Height:  5\' 8"  (172.7 cm)   ?BEHAVIORAL SYMPTOMS/MOOD NEUROLOGICAL BOWEL NUTRITION STATUS  ?    Continent Diet (heart healthy/carb modified)  ?AMBULATORY STATUS COMMUNICATION OF NEEDS Skin   ?Limited Assist Verbally Surgical wounds ?  ?  ?  ?    ?     ?     ? ? ?Personal Care Assistance Level of Assistance  ?Bathing, Feeding, Dressing Bathing Assistance: Limited assistance ?Feeding assistance: Limited assistance ?Dressing Assistance: Limited assistance ?   ? ?Functional Limitations Info  ?Sight, Hearing, Speech Sight Info: Impaired ?Hearing Info: Adequate ?Speech Info: Adequate  ? ? ?SPECIAL CARE FACTORS FREQUENCY  ?PT (By licensed PT), OT (By licensed OT)   ?  ?PT Frequency: 5x/week ?OT Frequency: 5x/week ?  ?  ?  ?   ? ? ?Contractures Contractures Info: Not present  ? ? ?Additional Factors Info  ?Code Status, Allergies Code Status Info: Full ?Allergies Info: chlorhexidine, tape ?  ?  ?  ?   ? ?Current Medications (04/10/2022):  This is the current hospital active medication list ?Current Facility-Administered Medications  ?Medication Dose Route Frequency Provider Last Rate Last Admin  ? amoxicillin-clavulanate (AUGMENTIN) 875-125 MG per tablet 1 tablet  1 tablet Oral Q12H [name], MD   1 tablet at 04/10/22 1025  ? carvedilol (COREG) tablet 6.25 mg  6.25 mg Oral BID [name], MD   6.25 mg at 04/10/22 1026  ? diphenhydrAMINE-zinc acetate (BENADRYL) 2-0.1 % cream   Topical TID PRN [name], MD   Given at 04/08/22 2006  ? doxycycline (VIBRA-TABS) tablet 100 mg  100 mg Oral Q12H [name], MD   100 mg at 04/10/22 1025  ? ferrous sulfate tablet 325 mg  325 mg Oral TID WC [name], MD   325 mg at 04/10/22 1320  ? spironolactone (ALDACTONE) tablet 50 mg  50 mg Oral Daily [name], MD   50 mg at 04/10/22 1026  ? And  ? furosemide (LASIX) tablet 20 mg  20 mg Oral Daily [name], MD   20 mg at 04/10/22 1026  ? gabapentin (NEURONTIN) capsule 600 mg  600 mg Oral TID PRN [name], MD   600 mg at  04/07/22 2312  ? Gerhardt's butt cream   Topical BID [name], MD   Given at 04/10/22 1026  ? insulin aspart (novoLOG) injection 0-15 Units  0-15 Units Subcutaneous TID WC [name], MD   3 Units at 04/10/22 1321  ? insulin aspart (novoLOG) injection 0-5 Units  0-5 Units Subcutaneous QHS [name], MD   2 Units at 04/07/22 2314  ? insulin aspart (novoLOG) injection 4 Units  4 Units Subcutaneous TID WC [name], MD   4 Units at 04/10/22 1320  ? insulin glargine-yfgn (SEMGLEE) injection 65 Units  65 Units Subcutaneous QHS [name], MD   65 Units at 04/09/22 2114  ? ketorolac (ACULAR) 0.5 % ophthalmic solution 1 drop  1 drop Both Eyes QID PRN [name], MD      ? lip balm (CARMEX) ointment   Topical PRN [name], MD   Given at 04/06/22 2359  ? nystatin (MYCOSTATIN/NYSTOP) topical powder   Topical TID [name], MD   Given at 04/10/22 1026  ? ondansetron (ZOFRAN) injection 4 mg  4 mg Intravenous Q6H PRN [name], MD   4 mg at 04/09/22 1330  ? pantoprazole (PROTONIX) EC tablet 40 mg  40 mg Oral Daily PRN [name], MD   40 mg at 04/07/22 1642  ? pravastatin (PRAVACHOL) tablet 20 mg  20 mg Oral QHS [name], MD   20 mg at 04/09/22 2113  ? sertraline (ZOLOFT) tablet 200 mg  200 mg Oral Daily [name], MD   200 mg at 04/10/22 1026  ? zolpidem (AMBIEN) tablet 5 mg  5 mg Oral QHS PRN [name], MD      ? ? ? ?Discharge Medications: ?Please see discharge summary for a list of discharge medications. ? ?Relevant Imaging Results: ? ?Relevant Lab Results: ? ? ?Additional Information ?SS# [id number]; covid-19 vaccines - Pfizer x2 ? ?Tawanna Cooler, RN ? ? ? ? ?

## 2022-04-11 DIAGNOSIS — Z89432 Acquired absence of left foot: Secondary | ICD-10-CM | POA: Diagnosis not present

## 2022-04-11 DIAGNOSIS — M86672 Other chronic osteomyelitis, left ankle and foot: Secondary | ICD-10-CM | POA: Diagnosis not present

## 2022-04-11 LAB — CBC
HCT: 26.1 % — ABNORMAL LOW (ref 36.0–46.0)
Hemoglobin: 8.5 g/dL — ABNORMAL LOW (ref 12.0–15.0)
MCH: 31 pg (ref 26.0–34.0)
MCHC: 32.6 g/dL (ref 30.0–36.0)
MCV: 95.3 fL (ref 80.0–100.0)
Platelets: 89 10*3/uL — ABNORMAL LOW (ref 150–400)
RBC: 2.74 MIL/uL — ABNORMAL LOW (ref 3.87–5.11)
RDW: 20.2 % — ABNORMAL HIGH (ref 11.5–15.5)
WBC: 4.3 10*3/uL (ref 4.0–10.5)
nRBC: 0 % (ref 0.0–0.2)

## 2022-04-11 LAB — RESP PANEL BY RT-PCR (FLU A&B, COVID) ARPGX2
Influenza A by PCR: NEGATIVE
Influenza B by PCR: NEGATIVE
SARS Coronavirus 2 by RT PCR: NEGATIVE

## 2022-04-11 LAB — GLUCOSE, CAPILLARY
Glucose-Capillary: 180 mg/dL — ABNORMAL HIGH (ref 70–99)
Glucose-Capillary: 229 mg/dL — ABNORMAL HIGH (ref 70–99)
Glucose-Capillary: 170 mg/dL — ABNORMAL HIGH (ref 70–99)
Glucose-Capillary: 127 mg/dL — ABNORMAL HIGH (ref 70–99)

## 2022-04-11 LAB — COMPREHENSIVE METABOLIC PANEL
ALT: 20 U/L (ref 0–44)
AST: 40 U/L (ref 15–41)
Albumin: 2.1 g/dL — ABNORMAL LOW (ref 3.5–5.0)
Alkaline Phosphatase: 79 U/L (ref 38–126)
Anion gap: 5 (ref 5–15)
BUN: 29 mg/dL — ABNORMAL HIGH (ref 8–23)
CO2: 26 mmol/L (ref 22–32)
Calcium: 8.3 mg/dL — ABNORMAL LOW (ref 8.9–10.3)
Chloride: 109 mmol/L (ref 98–111)
Creatinine, Ser: 0.87 mg/dL (ref 0.44–1.00)
GFR, Estimated: >60 mL/min (ref >60)
Glucose, Bld: 104 mg/dL — ABNORMAL HIGH (ref 70–99)
Potassium: 4 mmol/L (ref 3.5–5.1)
Sodium: 140 mmol/L (ref 135–145)
Total Bilirubin: 0.9 mg/dL (ref 0.3–1.2)
Total Protein: 5.7 g/dL — ABNORMAL LOW (ref 6.5–8.1)

## 2022-04-11 MED ORDER — AMOXICILLIN-POT CLAVULANATE 875-125 MG PO TABS: 1.0000 | ORAL_TABLET | Freq: Two times a day (BID) | ORAL | 0 refills | Status: AC

## 2022-04-11 MED ORDER — SERTRALINE HCL 100 MG PO TABS
100.0000 mg | ORAL_TABLET | Freq: Every day | ORAL | Status: DC
Start: 2022-04-11 — End: 2022-04-12
  Administered 2022-04-11 – 2022-04-12 (×2): 100 mg via ORAL
  Filled 2022-04-11 (×2): qty 1

## 2022-04-11 MED ORDER — ACETAMINOPHEN 325 MG PO TABS
650.0000 mg | ORAL_TABLET | Freq: Four times a day (QID) | ORAL | Status: DC | PRN
  Filled 2022-04-11: qty 2

## 2022-04-11 MED ORDER — LINEZOLID 600 MG PO TABS: 600.0000 mg | ORAL_TABLET | Freq: Two times a day (BID) | ORAL | 0 refills | Status: AC

## 2022-04-11 MED ORDER — CARVEDILOL 6.25 MG PO TABS: 6.2500 mg | ORAL_TABLET | Freq: Two times a day (BID) | ORAL | 0 refills | Status: AC

## 2022-04-11 MED ORDER — LINEZOLID 600 MG PO TABS
600.0000 mg | ORAL_TABLET | Freq: Two times a day (BID) | ORAL | Status: DC
  Administered 2022-04-11 – 2022-04-12 (×3): 600 mg via ORAL
  Filled 2022-04-11 (×3): qty 1

## 2022-04-11 MED ORDER — LOPERAMIDE HCL 2 MG PO CAPS
2.0000 mg | ORAL_CAPSULE | ORAL | Status: DC | PRN
  Administered 2022-04-11: 2 mg via ORAL
  Filled 2022-04-11: qty 1

## 2022-04-11 MED ORDER — SERTRALINE HCL 100 MG PO TABS: 100.0000 mg | ORAL_TABLET | Freq: Every day | ORAL | 0 refills | Status: DC

## 2022-04-11 NOTE — Progress Notes (Signed)
Physical Therapy Treatment ?Patient Details ?Name: Ariel Arnold ?MRN: [id number] ?DOB: 1956/03/14 ?Today's Date: 04/11/2022 ? ? ?History of Present Illness Patient is 66 y.o. female presented to ED for Lt transmet residual limb wound with malodor. MRI on 4/16 significant for evidence of acute osteomyelitis of distal third and fifth metatarsals, in addition to entire residual fourth metatarsal. Patient now s/p Lt transmetatarsal amputation revision on 04/05/22 complicated by significant blood loss that night requiring 3 units PRBC since surgery. PMH significant of aortic atherosclerosis, peripheral vascular disease, type 2 diabetes on insulin, liver cirrhosis secondary to NASH, hypertension, thrombocytopenia, and depression/anxiety. ? ?  ?PT Comments  ? ? General Comments: AxO x 3 very pleasant but required repaet instructions and c/o MAX fatigue.  Assisted OOB to [facility] was difficult.  General bed mobility comments: with increased c/o fatigue and weakness, pt required Max Assist and increased time. General transfer comment: pt was unable to rise from bed using walker , so performed "Bear Hug" stand pivot 1/4 turn from elevated bed to [facility] while attempted to stay NWBing L LE.  Comining off BSC, pt required + 2 Total Assist side by side and performed quick peri care then switched BSC with recliner from behind.  Pt was unable to support her weight.  Pt was unable to maintain NWB.  Rec lift back to bed. ?Pt will need ST Rehab at SNF prior to safely returning home.    ?Recommendations for follow up therapy are one component of a multi-disciplinary discharge planning process, led by the attending physician.  Recommendations may be updated based on patient status, additional functional criteria and insurance authorization. ? ?Follow Up Recommendations ? Skilled nursing-short term rehab (<3 hours/day) ?  ?  ?Assistance Recommended at Discharge Frequent or constant Supervision/Assistance  ?Patient can return home with the  following A lot of help with walking and/or transfers;A lot of help with bathing/dressing/bathroom;Assistance with cooking/housework;Direct supervision/assist for medications management;Direct supervision/assist for financial management;Assist for transportation;Help with stairs or ramp for entrance ?  ?Equipment Recommendations ? None recommended by PT  ?  ?Recommendations for Other Services   ? ? ?  ?Precautions / Restrictions Precautions ?Precautions: Fall ?Precaution Comments: re educated NWB ?Restrictions ?Weight Bearing Restrictions: Yes ?LLE Weight Bearing: Non weight bearing  ?  ? ?Mobility ? Bed Mobility ?Overal bed mobility: Needs Assistance ?Bed Mobility: Supine to Sit ?  ?  ?Supine to sit: Max assist, +2 for physical assistance ?  ?  ?General bed mobility comments: with increased c/o fatigue and weakness, pt required Max Assist and increased time. ?  ? ?Transfers ?Overall transfer level: Needs assistance ?Equipment used: Rolling walker (2 wheels) ?Transfers: Sit to/from Stand ?Sit to Stand: Max assist, Total assist, +2 physical assistance, +2 safety/equipment ?  ?  ?  ?  ?  ?General transfer comment: pt was unable to rise from bed using walker , so performed "Bear Hug" stand pivot 1/4 turn from elevated bed to [facility] while attempted to stay NWBing L LE.  Comining off BSC, pt required + 2 Total Assist side by side and performed quick peri care then switched BSC with recliner from behind.  Pt was unable to support her weight.  Pt was unable to maintain NWB.  Rec lift back to bed. ?  ? ?Ambulation/Gait ?  ?  ?  ?  ?  ?  ?  ?General Gait Details: unable ? ? ?Stairs ?  ?  ?  ?  ?  ? ? ?Wheelchair Mobility ?  ? ?  Modified Rankin (Stroke Patients Only) ?  ? ? ?  ?Balance   ?  ?  ?  ?  ?  ?  ?  ?  ?  ?  ?  ?  ?  ?  ?  ?  ?  ?  ?  ? ?  ?Cognition Arousal/Alertness: Awake/alert ?Behavior During Therapy: [facility] for tasks assessed/performed, Flat affect ?Overall Cognitive Status: Within Functional Limits for tasks  assessed ?  ?  ?  ?  ?  ?  ?  ?  ?  ?  ?  ?  ?  ?  ?  ?  ?General Comments: AxO x 3 very pleasant but required repaet instructions and c/o MAX fatigue ?  ?  ? ?  ?Exercises   ? ?  ?General Comments   ?  ?  ? ?Pertinent Vitals/Pain Pain Assessment ?Pain Assessment: No/denies pain  ? ? ?Home Living   ?  ?  ?  ?  ?  ?  ?  ?  ?  ?   ?  ?Prior Function    ?  ?  ?   ? ?PT Goals (current goals can now be found in the care plan section) Progress towards PT goals: Progressing toward goals ? ?  ?Frequency ? ? ? Min 2X/week ? ? ? ?  ?PT Plan Discharge plan needs to be updated  ? ? ?Co-evaluation   ?  ?  ?  ?  ? ?  ?AM-PAC PT "6 Clicks" Mobility   ?Outcome Measure ? Help needed turning from your back to your side while in a flat bed without using bedrails?: A Lot ?Help needed moving from lying on your back to sitting on the side of a flat bed without using bedrails?: A Lot ?Help needed moving to and from a bed to a chair (including a wheelchair)?: A Lot ?Help needed standing up from a chair using your arms (e.g., wheelchair or bedside chair)?: A Lot ?Help needed to walk in hospital room?: Total ?Help needed climbing 3-5 steps with a railing? : Total ?6 Click Score: 10 ? ?  ?End of Session Equipment Utilized During Treatment: Gait belt ?Activity Tolerance: Patient limited by fatigue ?Patient left: in chair;with call bell/phone within reach ?Nurse Communication: Mobility status;Need for lift equipment ?PT Visit Diagnosis: Unsteadiness on feet (R26.81);Muscle weakness (generalized) (M62.81);Difficulty in walking, not elsewhere classified (R26.2) ?  ? ? ?Time: 0865-7846 ?PT Time Calculation (min) (ACUTE ONLY): 19 min ? ?Charges:  $Therapeutic Activity: 8-22 mins          ?          ? ?{Savhanna Sliva  PTA ?Acute  Rehabilitation Services ?Pager      [phone] ?Office      [phone] ? ? ?

## 2022-04-11 NOTE — Care Management Important Message (Incomplete)
Important Message ? ?Patient Details IM Letter  ?Name: Ariel Arnold ?MRN: [id number] ?Date of Birth: [birth year] ? ? ?Medicare Important Message Given:  Yes ? ? ? ? ?Caren Macadam ?04/11/2022, 1:56 PM ?

## 2022-04-11 NOTE — Discharge Summary (Addendum)
?Physician Discharge Summary ?  ?Patient: Ariel Arnold MRN: [id number] DOB: 04/01/1956  ?Admit date:     04/02/2022  ?Discharge date: 04/12/22  ?Discharge Physician: Marylu Lund  ? ?PCP: Jacqualine Code, DO  ? ?Recommendations at discharge:  ? ? Follow up with PCP in 1-2 weeks ?Follow up with Podiatry as scheduled ? ?Discharge Diagnoses: ?Principal Problem: ?  Cellulitis ?Active Problems: ?  Liver cirrhosis secondary to NASH [facility]) ?  Thrombocytopenia ([place]) ?  Acquired absence of left foot ([place]) ?  Anemia in chronic kidney disease ?  Depressive disorder ?  Mixed hyperlipidemia ?  Type 2 diabetes mellitus treated with insulin ([place]) ?  Chronic osteomyelitis involving ankle and foot, left ([place]) ?  Essential hypertension ?  Lactic acidosis ?  Ascites ? ?Resolved Problems: ?  * No resolved hospital problems. * ? ?Hospital Course: ?66 y.o. female with medical history significant of aortic atherosclerosis, peripheral vascular disease, type 2 diabetes on insulin, liver cirrhosis secondary to NASH, hypertension, thrombocytopenia, and depression/anxiety.  One week prior to admission, she was experiencing a trail of blood coming from her left foot.  She has history of osteomyelitis previously seen by Dr. March Rummage of the podiatry team.  He performed a transmetatarsal amputation leaving an open wound for drainage.  Chronic osteomyelitis continued to be an issue and she was lost to follow-up over the past 2 months.  She has a history of neuropathy where she cannot feel anything on her feet.  She denies pain, fevers, shortness of breath.  MRI performed on 02/14/22 showed likely persistent osteomyelitis. ? ?4/16 Podiatry seen and evaluated, recommending repeat MRI, antibiotics, possible surgery 4/17. To follow. Patient is clinically stable. If stable changes in wound, consider outpatient management.  ? ?Assessment and Plan: ?Chronic osteomyelitis ?Acute osteomyelitis ?MRI on 4/16 significant for evidence of acute osteomyelitis of  distal third and fifth metatarsals, in addition to entire residual fourth metatarsal. ?-Patient had been continued on Zosyn.  Rare diptheroids on 1/2 wound cultures. Discussed with pharmacy,later changed to augmentin with doxy. Culture later became pos for corynebacterium. Abx changed to augmentin plus linezolid ?-Podiatry had recommended one week of abx to continue on d/c ?  ?Foot abscess ?Noted on MRI of left foot. On Zosyn as mentioned above. Currently stable. ?-Continue on above abx ?-Podiatry following.  Patient is now status post transmetatarsal amputation of the left ?-See below, recent postop complications of significant bleeding requiring multiple blood transfusions, most recent being 2 units on 4/21 for hgb of 6.1 ?-Hgb has now remained stable ?  ?Diabetes mellitus, type 2 ?Uncontrolled with hyperglycemia. Hemoglobin A1C of 11.2%. patient is on Humalog, Toujeo as an outpatient.Started on Semglee and SSI while inpatient ?-Cont home long and short acting regimen ?-Transient hypoglycemia earlier this admit, thus have held metformin ?  ?Acute on chronic anemia, with acute blood loss anemia ?-Postoperative hemoglobin noted to be in the 5 on the evening of 4/18 ?-Patient is now status post multiple units of PRBCs ?-hgb down to 6.1 on 4/21, requiring 2 more units PRBC's ?-Hgb has since remained stable ?  ?NASH ?Noted. Stable. History of ascites and portal hypertension. Moderate ascites noted on abdominal ultrasound (4/18). Currently asymptomatic. ?-continued on Lasix and spironolactone ?  ?Abdominal distension ?Asymptomatic. Likely secondary to ascites. ?  ?Thrombocytopenia ?Plts up to 89 ?  ?Depression ?Anxiety ?-Continue Zoloft, changed to 1/2 the home dose given concurrent linezolid per above ?  ?Primary hypertension ?-Continue Coreg ?  ?Hyperlipidemia ?-Continue Pravastatin ?  ?GERD ?-  Continue Protonix prn ?  ?  ? ? ?Consultants: Podiatry ?Procedures performed: L transmetatarsal amputation  ?Disposition:  Skilled nursing facility ?Diet recommendation:  ?Carb modified diet ?DISCHARGE MEDICATION: ?Allergies as of 04/12/2022   ? ?   Reactions  ? Chlorhexidine Rash  ? Tape Rash  ? ?  ? ?  ?Medication List  ?  ? ?STOP taking these medications   ? ?hydrOXYzine 25 MG tablet ?Commonly known as: ATARAX ?  ?metFORMIN 500 MG tablet ?Commonly known as: GLUCOPHAGE ?  ? ?  ? ?TAKE these medications   ? ?Accu-Chek Guide test strip ?Generic drug: glucose blood ?  ?Accu-Chek Guide w/Device Kit ?  ?Accu-Chek Softclix Lancets lancets ?SMARTSIG:2 Topical Twice Daily ?  ?albuterol 108 (90 Base) MCG/ACT inhaler ?Commonly known as: VENTOLIN HFA ?Inhale 2 puffs into the lungs every 4 (four) hours as needed for shortness of breath or wheezing. ?  ?amoxicillin-clavulanate 875-125 MG tablet ?Commonly known as: AUGMENTIN ?Take 1 tablet by mouth every 12 (twelve) hours for 7 days. ?  ?Calcium 1200 1200-1000 MG-UNIT Chew ?Chew 1 tablet by mouth daily. ?  ?carvedilol 6.25 MG tablet ?Commonly known as: COREG ?Take 6.25 mg by mouth 2 (two) times daily with a meal. ?What changed: Another medication with the same name was changed. Make sure you understand how and when to take each. ?  ?carvedilol 6.25 MG tablet ?Commonly known as: COREG ?Take 1 tablet (6.25 mg total) by mouth 2 (two) times daily. ?What changed:  ?medication strength ?how much to take ?  ?clotrimazole-betamethasone cream ?Commonly known as: LOTRISONE ?Apply topically 2 (two) times daily. ?  ?diclofenac Sodium 1 % Gel ?Commonly known as: VOLTAREN ?Apply 2 g topically in the morning and at bedtime. ?  ?ferrous sulfate 325 (65 FE) MG tablet ?Take 325 mg by mouth in the morning and at bedtime. ?  ?folic acid 1 MG tablet ?Commonly known as: FOLVITE ?Take 1 mg by mouth 2 (two) times daily. ?  ?furosemide 20 MG tablet ?Commonly known as: LASIX ?Take 20 mg by mouth daily as needed for fluid. ?  ?gabapentin 600 MG tablet ?Commonly known as: NEURONTIN ?Take 600 mg by mouth 3 (three) times daily  as needed (nerve pain). ?  ?Global Ease Inject Pen Needles 32G X 4 MM Misc ?Generic drug: Insulin Pen Needle ?  ?HumaLOG KwikPen 100 UNIT/ML KwikPen ?Generic drug: insulin lispro ?Inject 6 Units into the skin in the morning, at noon, in the evening, and at bedtime. ?  ?ketorolac 0.5 % ophthalmic solution ?Commonly known as: ACULAR ?Place 1 drop into both eyes 4 (four) times daily as needed (dry eyes). ?  ?linezolid 600 MG tablet ?Commonly known as: ZYVOX ?Take 1 tablet (600 mg total) by mouth every 12 (twelve) hours for 7 days. ?  ?pantoprazole 40 MG tablet ?Commonly known as: PROTONIX ?Take 40 mg by mouth daily as needed (heartburn). ?  ?pravastatin 20 MG tablet ?Commonly known as: PRAVACHOL ?Take 1 tablet (20 mg total) by mouth at bedtime. Take after completion of daptomycin ?  ?sertraline 100 MG tablet ?Commonly known as: ZOLOFT ?Take 1 tablet (100 mg total) by mouth daily. ?What changed:  ?how much to take ?Another medication with the same name was removed. Continue taking this medication, and follow the directions you see here. ?  ?spironolactone 50 MG tablet ?Commonly known as: ALDACTONE ?Take 50 mg by mouth daily as needed (swelling). ?  ?Toujeo Max SoloStar 300 UNIT/ML Solostar Pen ?Generic drug: insulin glargine (2 Unit Dial) ?  Inject 68 Units into the skin at bedtime. ?  ?vitamin C 500 MG tablet ?Commonly known as: ASCORBIC ACID ?Take 500 mg by mouth 2 (two) times daily. ?  ?Vitamin D (Ergocalciferol) 1.25 MG (50000 UNIT) Caps capsule ?Commonly known as: DRISDOL ?Take 1 capsule by mouth every 7 (seven) days. Mondays ?  ? ?  ? ?  ?  ? ? ?  ?Durable Medical Equipment  ?(From admission, onward)  ?  ? ? ?  ? ?  Start     Ordered  ? 04/05/22 1603  For home use only DME Other see comment  Once       ?Comments: Post-op shoe  ?Question:  Length of Need  Answer:  6 Months  ? 04/05/22 1602  ? ?  ?  ? ?  ? ? Follow-up Information   ? ? [facility] Follow up.   ?Why: You will receive PT at home through the  [facility] [place] branch. Their contact number is [phone]. They will call to schedule the first visit early next week. ? ?  ?  ? ?  ?  ? ?  ? ?Discharge Exam: ?Filed Weights  ? 04/02/22 1030 04/05/22

## 2022-04-11 NOTE — TOC Progression Note (Addendum)
Transition of Care (TOC) - Progression Note  ? ?Patient Details  ?Name: Ariel Arnold ?MRN: [id number] ?Date of Birth: 11-12-1956 ? ?Transition of Care (TOC) CM/SW Contact  ?Sherie Don, LCSW ?Phone Number: ?04/11/2022, 4:01 PM ? ?Clinical Narrative: CSW cancelled HHPT referral with [facility] as SNF is now the discharge plan. Patient did not receive any bed offers from [facility], so CSW called facilities to find a bed: ? ?[facility]): attempted to reach admissions, but was unable to leave VM ?[facility]: Judson Roch in admissions agreeable to reviewing referral ?[facility]: Aldona Bar in admissions willing to review referral (fax: [phone]) ?[facility]): left VM requesting call back ?[facility]: Apolonio Schneiders in admissions acccepted ? ?Daughter and patient accepted [facility]'s bed offer. CSW completed insurance authorization in the [place] portal. Plan Josem Kaufmann ID is: [id number]. Reference ID # is: [id number]. Patient was approved for 04/11/2022-04/13/2022. COVID test was negative. CSW received call from [place] in admissions at [facility] and was told the facility is rescinding the bed offer as they think the patient should be on contact precautions even though she is not on precautions in the hospital. CSW cancelled PTAR and updated daughter. Hospitalist and RN aware. CSW asked Judson Roch in admissions at [facility] to review patient again. CSW requested that insurance authorization be terminated in the [employer] portal. TOC to find new SNF bed. ? ?Expected Discharge Plan: [place] ?Barriers to Discharge: Continued Medical Work up ? ?Expected Discharge Plan and Services ?Expected Discharge Plan: [place] ?Discharge Planning Services: CM Consult ?Post Acute Care Choice: Home Health ?Living arrangements for the past 2 months: [place] ?Expected Discharge Date: 04/11/22               ?DME Arranged: N/A ?DME Agency: NA ?HH Arranged: PT ?[place] Agency:  [facility] ([facility] [place] [facility]) ?Date HH Agency Contacted: 04/08/22 ?Representative spoke with at [facility]: Add[place] ? ?Readmission Risk Interventions ? ?  11/16/2021  ?  9:27 AM  ?Readmission Risk Prevention Plan  ?Home Care Screening Complete  ?Medication Review (RN CM) Complete  ? ?

## 2022-04-12 LAB — CBC
HCT: 23.8 % — ABNORMAL LOW (ref 36.0–46.0)
Hemoglobin: 7.6 g/dL — ABNORMAL LOW (ref 12.0–15.0)
MCH: 30.4 pg (ref 26.0–34.0)
MCHC: 31.9 g/dL (ref 30.0–36.0)
MCV: 95.2 fL (ref 80.0–100.0)
Platelets: 76 10*3/uL — ABNORMAL LOW (ref 150–400)
RBC: 2.5 MIL/uL — ABNORMAL LOW (ref 3.87–5.11)
RDW: 19.9 % — ABNORMAL HIGH (ref 11.5–15.5)
WBC: 3.5 10*3/uL — ABNORMAL LOW (ref 4.0–10.5)
nRBC: 0 % (ref 0.0–0.2)

## 2022-04-12 LAB — GLUCOSE, CAPILLARY
Glucose-Capillary: 100 mg/dL — ABNORMAL HIGH (ref 70–99)
Glucose-Capillary: 152 mg/dL — ABNORMAL HIGH (ref 70–99)

## 2022-04-12 LAB — COMPREHENSIVE METABOLIC PANEL
ALT: 19 U/L (ref 0–44)
AST: 36 U/L (ref 15–41)
Albumin: 2 g/dL — ABNORMAL LOW (ref 3.5–5.0)
Alkaline Phosphatase: 72 U/L (ref 38–126)
Anion gap: 4 — ABNORMAL LOW (ref 5–15)
BUN: 32 mg/dL — ABNORMAL HIGH (ref 8–23)
CO2: 25 mmol/L (ref 22–32)
Calcium: 8.3 mg/dL — ABNORMAL LOW (ref 8.9–10.3)
Chloride: 109 mmol/L (ref 98–111)
Creatinine, Ser: 0.9 mg/dL (ref 0.44–1.00)
GFR, Estimated: >60 mL/min (ref >60)
Glucose, Bld: 171 mg/dL — ABNORMAL HIGH (ref 70–99)
Potassium: 3.8 mmol/L (ref 3.5–5.1)
Sodium: 138 mmol/L (ref 135–145)
Total Bilirubin: 0.7 mg/dL (ref 0.3–1.2)
Total Protein: 5.4 g/dL — ABNORMAL LOW (ref 6.5–8.1)

## 2022-04-12 NOTE — TOC Transition Note (Signed)
Transition of Care (TOC) - CM/SW Discharge Note ? ?Patient Details  ?Name: Ariel Arnold ?MRN: [id number] ?Date of Birth: [birth year] ? ?Transition of Care (TOC) CM/SW Contact:  ?Ewing Schlein, LCSW ?Phone Number: ?04/12/2022, 11:27 AM ? ?Clinical Narrative: [facility] in [place], [place] has offered a bed to the patient, which patient and her daughter have accepted. CSW completed insurance authorization in [place] portal. Reference ID # is: [id number]. Patient has been approved for 04/12/2022-04/14/2022. Discharge summary and COVID test results faxed to Sarah in admissions at [facility] [phone]). Medical necessity form complete; PTAR scheduled. Discharge packet completed. RN updated. TOC signing off. ? ?Final next level of care: Skilled Nursing Facility ?Barriers to Discharge: Barriers Resolved ? ?Patient Goals and CMS Choice ?Patient states their goals for this hospitalization and ongoing recovery are:: Discharge to [facility] rehab ?CMS Medicare.gov Compare Post Acute Care list provided to:: Patient ?Choice offered to / list presented to : Patient, Adult Children ? ?Discharge Placement        ?Patient chooses bed at: [facility] ?Patient to be transferred to facility by: PTAR ?Name of family member notified: [name] (daughter) ?Patient and family notified of of transfer: 04/12/22 ? ?Discharge Plan and Services ?Discharge Planning Services: CM Consult ?Post Acute Care Choice: Home Health          ?DME Arranged: N/A ?DME Agency: NA ?HH Arranged: PT ?HH Agency: [facility] ([facility] [place] [facility]) ?Date HH Agency Contacted: 04/08/22 ?Representative spoke with at [facility] Agency: Tammy ? ?Readmission Risk Interventions ? ?  11/16/2021  ?  9:27 AM  ?Readmission Risk Prevention Plan  ?Home Care Screening Complete  ?Medication Review (RN CM) Complete  ? ?

## 2022-04-12 NOTE — Progress Notes (Signed)
Patient was picked up by PTAR to take to [facility] in [place], [place]. Report was given to the RN at [place], and all questions were answered. PTAR was given the packet of paperwork and AVS. ?

## 2022-04-12 NOTE — Progress Notes (Signed)
?Progress Note ? ? ?Patient: Ariel Arnold KZS:[id number] DOB: [birth year] DOA: 04/02/2022     10 ?DOS: the patient was seen and examined on 04/12/2022 ?  ?Brief hospital course: ?66 y.o. female with medical history significant of aortic atherosclerosis, peripheral vascular disease, type 2 diabetes on insulin, liver cirrhosis secondary to NASH, hypertension, thrombocytopenia, and depression/anxiety.  One week prior to admission, she was experiencing a trail of blood coming from her left foot.  She has history of osteomyelitis previously seen by Dr. Samuella Cota of the podiatry team.  He performed a transmetatarsal amputation leaving an open wound for drainage.  Chronic osteomyelitis continued to be an issue and she was lost to follow-up over the past 2 months.  She has a history of neuropathy where she cannot feel anything on her feet.  She denies pain, fevers, shortness of breath.  MRI performed on 02/14/22 showed likely persistent osteomyelitis. ? ?4/16 Podiatry seen and evaluated, recommending repeat MRI, antibiotics, possible surgery 4/17. To follow. Patient is clinically stable. If stable changes in wound, consider outpatient management.  ? ?Assessment and Plan: ?Chronic osteomyelitis ?Acute osteomyelitis ?MRI on 4/16 significant for evidence of acute osteomyelitis of distal third and fifth metatarsals, in addition to entire residual fourth metatarsal. ?-Patient had been continued on Zosyn.  Rare diptheroids on 1/2 wound cultures. Discussed with pharmacy,later changed to augmentin with doxy. Culture later became pos for corynebacterium. Abx changed to augmentin plus linezolid ?-Podiatry had recommended one week of abx to continue on d/c ?  ?Foot abscess ?Noted on MRI of left foot. On Zosyn as mentioned above. Currently stable. ?-Continue on above abx ?-Podiatry following.  Patient is now status post transmetatarsal amputation of the left ?-See below, recent postop complications of significant bleeding requiring multiple  blood transfusions, most recent being 2 units on 4/21 for hgb of 6.1 ?-Hgb has now remained stable ?  ?Diabetes mellitus, type 2 ?Uncontrolled with hyperglycemia. Hemoglobin A1C of 11.2%. patient is on Humalog, Toujeo as an outpatient.Started on Semglee and SSI while inpatient ?-Cont home long and short acting regimen ?-Transient hypoglycemia earlier this admit, thus have held metformin ?  ?Acute on chronic anemia, with acute blood loss anemia ?-Postoperative hemoglobin noted to be in the 5 on the evening of 4/18 ?-Patient is now status post multiple units of PRBCs ?-hgb down to 6.1 on 4/21, requiring 2 more units PRBC's ?-Hgb has since remained stable ?  ?NASH ?Noted. Stable. History of ascites and portal hypertension. Moderate ascites noted on abdominal ultrasound (4/18). Currently asymptomatic. ?-continued on Lasix and spironolactone ?  ?Abdominal distension ?Asymptomatic. Likely secondary to ascites. ?  ?Thrombocytopenia ?Plts up to 89 ?  ?Depression ?Anxiety ?-Continue Zoloft, changed to 1/2 the home dose given concurrent linezolid per above ?  ?Primary hypertension ?-Continue Coreg ?  ?Hyperlipidemia ?-Continue Pravastatin ?  ?GERD ?-Continue Protonix prn ?  ?  ? ?Subjective: Without complaints today. Eager to start therapy ? ?Physical Exam: ?Vitals:  ? 04/11/22 1700 04/11/22 2208 04/12/22 0542 04/12/22 0918  ?BP: 131/70 (!) 108/53 (!) 121/49 121/71  ?Pulse: 77 78 74 88  ?Resp: 14 15 16 18   ?Temp: 98 ?F (36.7 ?C) 98.5 ?F (36.9 ?C) 98.4 ?F (36.9 ?C) 98.7 ?F (37.1 ?C)  ?TempSrc: Oral Oral Oral Oral  ?SpO2: 96% 97% 96% 100%  ?Weight:      ?Height:      ? ?General exam: Conversant, in no acute distress ?Respiratory system: normal chest rise, clear, no audible wheezing ?Cardiovascular system: regular rhythm, s1-s2 ?Gastrointestinal  system: Nondistended, nontender, pos BS ?Central nervous system: No seizures, no tremors ?Extremities: No cyanosis, no joint deformities, LLE dressing in place ?Skin: No rashes, no  pallor ?Psychiatry: Affect normal // no auditory hallucinations  ? ?Data Reviewed: ? ?Labs reviewed: K 3.8, Cr 0.90 ? ?Family Communication:  Pt in room, family currently not at bedside ? ?Disposition: ?Status is: Inpatient ?Remains inpatient appropriate because: Severity of illness ? Planned Discharge Destination: Home and Skilled nursing facility ? ? ? ?Author: ?Rickey Barbara, MD ?04/12/2022 11:13 AM ? ?For on call review [email or web address].  ?

## 2022-04-13 ENCOUNTER — Telehealth: Payer: Self-pay | Admitting: *Deleted

## 2022-04-13 NOTE — Telephone Encounter (Signed)
[facility] is calling because patient's incision was pouring blood this morning and completely split, finally got the bleeding to stop, rewrapped  and patient is stable.  ?She does not have transportation and they have to have a five day notice for their transportation dept. Please advise. ?

## 2022-04-14 ENCOUNTER — Telehealth: Payer: Self-pay | Admitting: *Deleted

## 2022-04-14 NOTE — Telephone Encounter (Signed)
She has been scheduled for 5 /3  with you @10 :45, facility will arrange transportation for patient. ?They are coming from [facility] location would be better. ?

## 2022-04-14 NOTE — Telephone Encounter (Signed)
Ariel Nones, NP called back to let you know that the incision has completely dehisced and appears to be "tunneling". They took all the staples out. Patient is still on 2 antibiotics until 5/3. Please call [name] to discuss care plan ?

## 2022-04-14 NOTE — Telephone Encounter (Signed)
Claiborne Billings, [phone]) said the NP took out 8 stitches, 2 fell out on their own, she said that it looks infected, no fever. Patient is on Augmentin-q 12 hours until 05/02,Zyvox-600 mg two times daily until 05/03. ?If need to seen, can work something out for her transportation. ?

## 2022-04-14 NOTE — Telephone Encounter (Signed)
Yes if we can see her tomorrow that would be great. Thank you  ? ?

## 2022-04-15 NOTE — Telephone Encounter (Signed)
Ok thank you so much.

## 2022-04-20 ENCOUNTER — Ambulatory Visit: Payer: Medicare Other | Admitting: Podiatry

## 2022-05-10 ENCOUNTER — Ambulatory Visit (INDEPENDENT_AMBULATORY_CARE_PROVIDER_SITE_OTHER): Payer: Self-pay | Admitting: Podiatry

## 2022-05-10 DIAGNOSIS — Z91199 Patient's noncompliance with other medical treatment and regimen due to unspecified reason: Secondary | ICD-10-CM

## 2022-05-10 NOTE — Progress Notes (Signed)
No show

## 2022-06-02 ENCOUNTER — Other Ambulatory Visit: Payer: Self-pay

## 2022-06-02 ENCOUNTER — Telehealth: Payer: Self-pay | Admitting: Internal Medicine

## 2022-06-02 ENCOUNTER — Inpatient Hospital Stay (HOSPITAL_COMMUNITY)
Admission: AD | Admit: 2022-06-02 | Discharge: 2022-06-08 | DRG: 475 | Disposition: A | Discharge status: Skilled Nursing Facility | Payer: Medicare Other | Source: Other Acute Inpatient Hospital | Attending: Internal Medicine | Admitting: Internal Medicine

## 2022-06-02 DIAGNOSIS — R7881 Bacteremia: Secondary | ICD-10-CM | POA: Diagnosis present

## 2022-06-02 DIAGNOSIS — K7460 Unspecified cirrhosis of liver: Secondary | ICD-10-CM | POA: Diagnosis present

## 2022-06-02 DIAGNOSIS — E669 Obesity, unspecified: Secondary | ICD-10-CM | POA: Diagnosis present

## 2022-06-02 DIAGNOSIS — L03116 Cellulitis of left lower limb: Secondary | ICD-10-CM | POA: Diagnosis present

## 2022-06-02 DIAGNOSIS — E1165 Type 2 diabetes mellitus with hyperglycemia: Secondary | ICD-10-CM | POA: Diagnosis present

## 2022-06-02 DIAGNOSIS — E1122 Type 2 diabetes mellitus with diabetic chronic kidney disease: Secondary | ICD-10-CM | POA: Diagnosis present

## 2022-06-02 DIAGNOSIS — D631 Anemia in chronic kidney disease: Secondary | ICD-10-CM | POA: Diagnosis present

## 2022-06-02 DIAGNOSIS — Y835 Amputation of limb(s) as the cause of abnormal reaction of the patient, or of later complication, without mention of misadventure at the time of the procedure: Secondary | ICD-10-CM | POA: Diagnosis present

## 2022-06-02 DIAGNOSIS — T8744 Infection of amputation stump, left lower extremity: Secondary | ICD-10-CM | POA: Diagnosis present

## 2022-06-02 DIAGNOSIS — K7581 Nonalcoholic steatohepatitis (NASH): Secondary | ICD-10-CM | POA: Diagnosis present

## 2022-06-02 DIAGNOSIS — I8511 Secondary esophageal varices with bleeding: Secondary | ICD-10-CM | POA: Diagnosis not present

## 2022-06-02 DIAGNOSIS — Z833 Family history of diabetes mellitus: Secondary | ICD-10-CM

## 2022-06-02 DIAGNOSIS — L089 Local infection of the skin and subcutaneous tissue, unspecified: Secondary | ICD-10-CM | POA: Diagnosis not present

## 2022-06-02 DIAGNOSIS — Z6830 Body mass index (BMI) 30.0-30.9, adult: Secondary | ICD-10-CM | POA: Diagnosis not present

## 2022-06-02 DIAGNOSIS — E119 Type 2 diabetes mellitus without complications: Secondary | ICD-10-CM

## 2022-06-02 DIAGNOSIS — M86172 Other acute osteomyelitis, left ankle and foot: Secondary | ICD-10-CM | POA: Diagnosis present

## 2022-06-02 DIAGNOSIS — K219 Gastro-esophageal reflux disease without esophagitis: Secondary | ICD-10-CM | POA: Diagnosis present

## 2022-06-02 DIAGNOSIS — I10 Essential (primary) hypertension: Secondary | ICD-10-CM | POA: Diagnosis present

## 2022-06-02 DIAGNOSIS — E1151 Type 2 diabetes mellitus with diabetic peripheral angiopathy without gangrene: Secondary | ICD-10-CM | POA: Diagnosis present

## 2022-06-02 DIAGNOSIS — Z87891 Personal history of nicotine dependence: Secondary | ICD-10-CM | POA: Diagnosis not present

## 2022-06-02 DIAGNOSIS — Z79899 Other long term (current) drug therapy: Secondary | ICD-10-CM

## 2022-06-02 DIAGNOSIS — Z83438 Family history of other disorder of lipoprotein metabolism and other lipidemia: Secondary | ICD-10-CM | POA: Diagnosis not present

## 2022-06-02 DIAGNOSIS — B9562 Methicillin resistant Staphylococcus aureus infection as the cause of diseases classified elsewhere: Secondary | ICD-10-CM | POA: Diagnosis present

## 2022-06-02 DIAGNOSIS — N182 Chronic kidney disease, stage 2 (mild): Secondary | ICD-10-CM | POA: Diagnosis present

## 2022-06-02 DIAGNOSIS — Z87442 Personal history of urinary calculi: Secondary | ICD-10-CM | POA: Diagnosis not present

## 2022-06-02 DIAGNOSIS — E782 Mixed hyperlipidemia: Secondary | ICD-10-CM | POA: Diagnosis present

## 2022-06-02 DIAGNOSIS — E1169 Type 2 diabetes mellitus with other specified complication: Secondary | ICD-10-CM | POA: Diagnosis present

## 2022-06-02 DIAGNOSIS — F32A Depression, unspecified: Secondary | ICD-10-CM | POA: Diagnosis present

## 2022-06-02 DIAGNOSIS — Z89432 Acquired absence of left foot: Secondary | ICD-10-CM

## 2022-06-02 DIAGNOSIS — Z8249 Family history of ischemic heart disease and other diseases of the circulatory system: Secondary | ICD-10-CM

## 2022-06-02 DIAGNOSIS — D61818 Other pancytopenia: Secondary | ICD-10-CM | POA: Diagnosis present

## 2022-06-02 DIAGNOSIS — M869 Osteomyelitis, unspecified: Secondary | ICD-10-CM | POA: Diagnosis not present

## 2022-06-02 DIAGNOSIS — Z794 Long term (current) use of insulin: Secondary | ICD-10-CM

## 2022-06-02 DIAGNOSIS — I129 Hypertensive chronic kidney disease with stage 1 through stage 4 chronic kidney disease, or unspecified chronic kidney disease: Secondary | ICD-10-CM | POA: Diagnosis present

## 2022-06-02 DIAGNOSIS — T8789 Other complications of amputation stump: Secondary | ICD-10-CM | POA: Diagnosis present

## 2022-06-02 LAB — GLUCOSE, CAPILLARY: Glucose-Capillary: 187 mg/dL — ABNORMAL HIGH (ref 70–99)

## 2022-06-02 MED ORDER — ONDANSETRON HCL 4 MG PO TABS: 4.0000 mg | ORAL_TABLET | Freq: Four times a day (QID) | ORAL | Status: DC | PRN

## 2022-06-02 MED ORDER — MORPHINE SULFATE (PF) 2 MG/ML IV SOLN
2.0000 mg | INTRAVENOUS | Status: DC | PRN
  Administered 2022-06-04: 2 mg via INTRAVENOUS
  Filled 2022-06-02: qty 1

## 2022-06-02 MED ORDER — SODIUM CHLORIDE 0.9 % IV SOLN
2.0000 g | Freq: Once | INTRAVENOUS | Status: AC
  Administered 2022-06-03: 2 g via INTRAVENOUS
  Filled 2022-06-02: qty 12.5

## 2022-06-02 MED ORDER — ONDANSETRON HCL 4 MG/2ML IJ SOLN: 4.0000 mg | Freq: Four times a day (QID) | INTRAMUSCULAR | Status: DC | PRN

## 2022-06-02 MED ORDER — VANCOMYCIN HCL 1500 MG/300ML IV SOLN
1500.0000 mg | Freq: Once | INTRAVENOUS | Status: AC
Start: 2022-06-03 — End: 2022-06-03
  Administered 2022-06-03: 1500 mg via INTRAVENOUS
  Filled 2022-06-02: qty 300

## 2022-06-02 MED ORDER — ENOXAPARIN SODIUM 40 MG/0.4ML IJ SOSY
40.0000 mg | PREFILLED_SYRINGE | INTRAMUSCULAR | Status: DC
  Administered 2022-06-03 – 2022-06-07 (×4): 40 mg via SUBCUTANEOUS
  Filled 2022-06-02 (×4): qty 0.4

## 2022-06-02 MED ORDER — INSULIN ASPART 100 UNIT/ML IJ SOLN
0.0000 [IU] | Freq: Three times a day (TID) | INTRAMUSCULAR | Status: DC
  Administered 2022-06-03 – 2022-06-04 (×4): 4 [IU] via SUBCUTANEOUS
  Administered 2022-06-04 (×2): 3 [IU] via SUBCUTANEOUS
  Administered 2022-06-05: 4 [IU] via SUBCUTANEOUS
  Administered 2022-06-05: 7 [IU] via SUBCUTANEOUS
  Administered 2022-06-06: 3 [IU] via SUBCUTANEOUS
  Administered 2022-06-06 (×2): 4 [IU] via SUBCUTANEOUS
  Administered 2022-06-07 (×2): 3 [IU] via SUBCUTANEOUS
  Administered 2022-06-08: 4 [IU] via SUBCUTANEOUS
  Administered 2022-06-08: 3 [IU] via SUBCUTANEOUS

## 2022-06-02 MED ORDER — INSULIN ASPART 100 UNIT/ML IJ SOLN
0.0000 [IU] | Freq: Every day | INTRAMUSCULAR | Status: DC
  Administered 2022-06-04: 2 [IU] via SUBCUTANEOUS

## 2022-06-02 NOTE — H&P (Incomplete)
History and Physical    Patient: Ariel Arnold KMQ:[id number] DOB: 1956/10/10 DOA: 06/02/2022 DOS: the patient was seen and examined on 06/02/2022 PCP: Jacqualine Code, DO  Patient coming from: {Point_of_Origin:26777}  Chief Complaint: No chief complaint on file.  HPI: Ariel Arnold is a 66 y.o. female with medical history significant of ***  Review of Systems: {ROS_Text:26778} Past Medical History:  Diagnosis Date   Anemia    Anxiety    Aortic atherosclerosis ([place])    Arthritis    AVM (arteriovenous malformation) of colon    Depression    Essential hypertension    GI bleeding    Recurrent   Hepatic encephalopathy ([place])    History of kidney stones    History of RSV infection    Liver cirrhosis secondary to NASH ([place]) 2004   Biopsy-proven   Obesity    Osteomyelitis ([place])    Peripheral vascular disease (HCC)    Thrombocytopenia ([place])    Type 2 diabetes mellitus ([place])    Past Surgical History:  Procedure Laterality Date   ABDOMINAL HYSTERECTOMY     AMPUTATION Left 04/05/2022   Procedure: Metatarsal amputation revision;  Surgeon: Lorenda Peck, MD;  Location: [facility];  Service: Podiatry;  Laterality: Left;  Surgical team to do block   COLONOSCOPY     Cystoscopy with ureteral stent     ESOPHAGOGASTRODUODENOSCOPY     ESOPHAGOGASTRODUODENOSCOPY (EGD) WITH PROPOFOL N/A 05/20/2021   Procedure: ESOPHAGOGASTRODUODENOSCOPY (EGD) WITH PROPOFOL;  Surgeon: Daneil Dolin, MD;  [facility];  Service: Endoscopy;  Laterality: N/A;  needs intubation   IR ANGIOGRAM FOLLOW UP STUDY  05/21/2021   IR ANGIOGRAM SELECTIVE EACH ADDITIONAL VESSEL  05/21/2021   IR EMBO VENOUS NOT HEMORR HEMANG  INC GUIDE ROADMAPPING  05/21/2021   IR US GUIDE VASC ACCESS RIGHT  05/21/2021   IR VENOGRAM RENAL UNI LEFT  05/21/2021   RADIOLOGY WITH ANESTHESIA N/A 05/21/2021   Procedure: IR WITH ANESTHESIA;  Surgeon: Radiologist, Medication, MD;  Location: [place];  Service: Radiology;  Laterality: N/A;   Toe  amputations Bilateral    TUBAL LIGATION     WOUND DEBRIDEMENT Left 11/12/2021   Procedure: DEBRIDEMENT WOUND;FIRST METATARSAL RESECTION; APPLICATION OF SKIN GRAFT SUBSTITUTE;  Surgeon: Evelina Bucy, DPM;  Location: [facility];  Service: Podiatry;  Laterality: Left;   Social History:  reports that she has quit smoking. Her smoking use included cigarettes. She has never used smokeless tobacco. She reports current alcohol use. She reports that she does not use drugs.  Allergies  Allergen Reactions   Chlorhexidine Rash   Tape Rash    Family History  Problem Relation Age of Onset   Heart failure Mother    Hyperlipidemia Mother    Diabetes Mellitus II Mother    Heart failure Father    Hyperlipidemia Father     Prior to Admission medications   Medication Sig Start Date End Date Taking? Authorizing Provider  ACCU-CHEK GUIDE test strip  08/27/21   [provider]  Accu-Chek Softclix Lancets lancets SMARTSIG:2 Topical Twice Daily 08/27/21   [provider]  albuterol (VENTOLIN HFA) 108 (90 Base) MCG/ACT inhaler Inhale 2 puffs into the lungs every 4 (four) hours as needed for shortness of breath or wheezing. 01/11/21   [provider]  Blood Glucose Monitoring Suppl (ACCU-CHEK GUIDE) w/Device KIT  08/27/21   [provider]  Calcium Carbonate-Vit D-Min (CALCIUM 1200) 1200-1000 MG-UNIT CHEW Chew 1 tablet by mouth daily.  [provider]  carvedilol (COREG) 6.25 MG tablet Take 6.25 mg by mouth 2 (two) times daily with a meal. 04/01/22   [provider]  carvedilol (COREG) 6.25 MG tablet Take 1 tablet (6.25 mg total) by mouth 2 (two) times daily. 04/11/22 05/11/22  Ariel Hazel, MD  clotrimazole-betamethasone (LOTRISONE) cream Apply topically 2 (two) times daily. 11/25/21   [provider]  diclofenac Sodium (VOLTAREN) 1 % GEL Apply 2 g topically in the morning and at bedtime. 12/02/21   [provider]  ferrous sulfate 325 (65 FE)  MG tablet Take 325 mg by mouth in the morning and at bedtime.    [provider]  folic acid (FOLVITE) 1 MG tablet Take 1 mg by mouth 2 (two) times daily. 06/02/21   [provider]  furosemide (LASIX) 20 MG tablet Take 20 mg by mouth daily as needed for fluid. 05/27/21   [provider]  gabapentin (NEURONTIN) 600 MG tablet Take 600 mg by mouth 3 (three) times daily as needed (nerve pain). 07/23/21   [provider]  GLOBAL EASE INJECT PEN NEEDLES 32G X 4 MM MISC  08/27/21   [provider]  HUMALOG KWIKPEN 100 UNIT/ML KwikPen Inject 6 Units into the skin in the morning, at noon, in the evening, and at bedtime. 12/09/21   [provider]  ketorolac (ACULAR) 0.5 % ophthalmic solution Place 1 drop into both eyes 4 (four) times daily as needed (dry eyes). 09/06/21   [provider]  pantoprazole (PROTONIX) 40 MG tablet Take 40 mg by mouth daily as needed (heartburn). 05/27/21   [provider]  pravastatin (PRAVACHOL) 20 MG tablet Take 1 tablet (20 mg total) by mouth at bedtime. Take after completion of daptomycin 01/03/22   [name], [name], MD  sertraline (ZOLOFT) 100 MG tablet Take 1 tablet (100 mg total) by mouth daily. 04/12/22 05/12/22  Ariel Hazel, MD  spironolactone (ALDACTONE) 50 MG tablet Take 50 mg by mouth daily as needed (swelling). 05/27/21   [provider]  TOUJEO MAX SOLOSTAR 300 UNIT/ML Solostar Pen Inject 68 Units into the skin at bedtime. 06/03/21   [provider]  vitamin C (ASCORBIC ACID) 500 MG tablet Take 500 mg by mouth 2 (two) times daily.    [provider]  Vitamin D, Ergocalciferol, (DRISDOL) 1.25 MG (50000 UNIT) CAPS capsule Take 1 capsule by mouth every 7 (seven) days. Mondays 04/05/21   [provider]    Physical Exam: Vitals:   06/02/22 2244  BP: (!) 117/53  Pulse: 75  Resp: 17  Temp: 98.6 F (37 C)  TempSrc: Oral  SpO2: 98%  Weight: 85.6 kg  Height: $Remove'5\' 6"'sIvVQOp$  (1.676  m)   *** Data Reviewed: {Tip this will not be part of the note when signed- Document your independent interpretation of telemetry tracing, EKG, lab, Radiology test or any other diagnostic tests. Add any new diagnostic test ordered today. (Optional):26781} {Results:26384}  Assessment and Plan: No notes have been filed under this hospital service. Service: Hospitalist     Advance Care Planning:   Code Status: Full Code ***  Consults: ***  Family Communication: ***  Severity of Illness: {Observation/Inpatient:21159}  AuthorBarbette Merino, MD 06/02/2022 11:29 PM  For on call review [email or web address].

## 2022-06-02 NOTE — Telephone Encounter (Signed)
Plan of Care Note for accepted transfer  Patient: Ariel Arnold    YKD:[id number]  DOA: @ADMITDT @     Facility requesting transfer: [facility] Requesting Provider: [facility]. [facility], inpatient Reason for transfer: Family request, Podiatry evaluation for concern for osteomyelitis/wound infection.  Facility course: 66 year old female with past medical history of HTN type II DM, PVD, 77 leading to liver cirrhosis, chronic pancytopenia, HLD, depression presented to the ER in [place] on 06/01/2022, with complaints of confusion and fever as well as foul-smelling discharge coming from left foot amputation site. Admitted to the hospital on telemetry floor.  Patient had left transmetatarsal amputation by Dr. 06/03/2022 on 04/05/2022 for osteomyelitis. On admission patient had a temperature of 102 meeting sepsis criteria. Patient was treated with IV vancomycin cefepime and Flagyl later on transition to IV Rocephin and Flagyl. Patient also had acute encephalopathy, suspected to have hepatic origin.  Treated with lactulose with improvement in mentation within 24 hours.  X-ray showed evidence of osteomyelitis.  Surgery team there felt that patient may not have osteomyelitis but recommended MRI to confirm. Creatinine 1.0. blood culture no growth in 24 hours.  Plan of care: The patient is accepted for admission to Med-surg  unit, at [facility]. IV antibiotic will be continued. Recommended transferring team to discuss with podiatry to ensure that they are comfortable and available to take care of the patient.  Author: [facility], MD  06/02/2022  Check [email or web address] for on-call coverage.  Nursing staff, Please call [facility] Admits & Consults System-Wide number on Amion as soon as patient's arrival, so appropriate admitting provider can evaluate the pt.

## 2022-06-03 ENCOUNTER — Encounter (HOSPITAL_COMMUNITY): Payer: Self-pay | Admitting: Internal Medicine

## 2022-06-03 ENCOUNTER — Inpatient Hospital Stay (HOSPITAL_COMMUNITY): Payer: Medicare Other

## 2022-06-03 DIAGNOSIS — L03116 Cellulitis of left lower limb: Secondary | ICD-10-CM

## 2022-06-03 DIAGNOSIS — M86172 Other acute osteomyelitis, left ankle and foot: Secondary | ICD-10-CM | POA: Diagnosis not present

## 2022-06-03 LAB — CBC WITH DIFFERENTIAL/PLATELET
Abs Immature Granulocytes: 0.03 10*3/uL (ref 0.00–0.07)
Basophils Absolute: 0 10*3/uL (ref 0.0–0.1)
Basophils Relative: 0 %
Eosinophils Absolute: 0.1 10*3/uL (ref 0.0–0.5)
Eosinophils Relative: 6 %
HCT: 25.5 % — ABNORMAL LOW (ref 36.0–46.0)
Hemoglobin: 7.9 g/dL — ABNORMAL LOW (ref 12.0–15.0)
Immature Granulocytes: 1 %
Lymphocytes Relative: 28 %
Lymphs Abs: 0.7 10*3/uL (ref 0.7–4.0)
MCH: 32.4 pg (ref 26.0–34.0)
MCHC: 31 g/dL (ref 30.0–36.0)
MCV: 104.5 fL — ABNORMAL HIGH (ref 80.0–100.0)
Monocytes Absolute: 0.3 10*3/uL (ref 0.1–1.0)
Monocytes Relative: 13 %
Neutro Abs: 1.2 10*3/uL — ABNORMAL LOW (ref 1.7–7.7)
Neutrophils Relative %: 52 %
Platelets: 83 10*3/uL — ABNORMAL LOW (ref 150–400)
RBC: 2.44 MIL/uL — ABNORMAL LOW (ref 3.87–5.11)
RDW: 17.7 % — ABNORMAL HIGH (ref 11.5–15.5)
WBC: 2.3 10*3/uL — ABNORMAL LOW (ref 4.0–10.5)
nRBC: 0 % (ref 0.0–0.2)

## 2022-06-03 LAB — COMPREHENSIVE METABOLIC PANEL
ALT: 26 U/L (ref 0–44)
AST: 34 U/L (ref 15–41)
Albumin: 2.4 g/dL — ABNORMAL LOW (ref 3.5–5.0)
Alkaline Phosphatase: 96 U/L (ref 38–126)
Anion gap: 8 (ref 5–15)
BUN: 24 mg/dL — ABNORMAL HIGH (ref 8–23)
CO2: 20 mmol/L — ABNORMAL LOW (ref 22–32)
Calcium: 7.9 mg/dL — ABNORMAL LOW (ref 8.9–10.3)
Chloride: 108 mmol/L (ref 98–111)
Creatinine, Ser: 0.96 mg/dL (ref 0.44–1.00)
GFR, Estimated: >60 mL/min (ref >60)
Glucose, Bld: 197 mg/dL — ABNORMAL HIGH (ref 70–99)
Potassium: 3.7 mmol/L (ref 3.5–5.1)
Sodium: 136 mmol/L (ref 135–145)
Total Bilirubin: 0.8 mg/dL (ref 0.3–1.2)
Total Protein: 6 g/dL — ABNORMAL LOW (ref 6.5–8.1)

## 2022-06-03 LAB — HIV ANTIBODY (ROUTINE TESTING W REFLEX): HIV Screen 4th Generation wRfx: NONREACTIVE

## 2022-06-03 LAB — GLUCOSE, CAPILLARY
Glucose-Capillary: 161 mg/dL — ABNORMAL HIGH (ref 70–99)
Glucose-Capillary: 198 mg/dL — ABNORMAL HIGH (ref 70–99)
Glucose-Capillary: 170 mg/dL — ABNORMAL HIGH (ref 70–99)
Glucose-Capillary: 154 mg/dL — ABNORMAL HIGH (ref 70–99)

## 2022-06-03 LAB — MRSA NEXT GEN BY PCR, NASAL: MRSA by PCR Next Gen: DETECTED — AB

## 2022-06-03 IMAGING — MR MR FOOT*L* WO/W CM
9 series · 39 of 40 positions shown · IV contrast (gadavist)
Comparison: X-ray [DATE], MRI [DATE]

CLINICAL DATA: Osteomyelitis, foot Osteomyelitis left foot

EXAM:
MRI OF THE LEFT FOREFOOT WITHOUT AND WITH CONTRAST
TECHNIQUE: Multiplanar, multisequence MR imaging of the left foot was performed
both before and after administration of intravenous contrast.
CONTRAST:  8mL GADAVIST GADOBUTROL 1 MMOL/ML IV SOLN

[Series 4: T1 · coronal · left · 3.0mm · 0.51mm/px · 5 of 40 slices shown (1 of 2)]
[im 1/40]
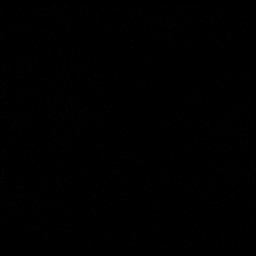
[im 10/40]
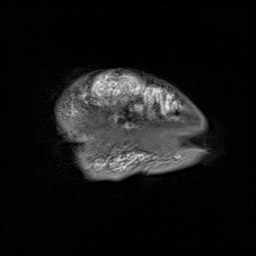
[im 20/40]
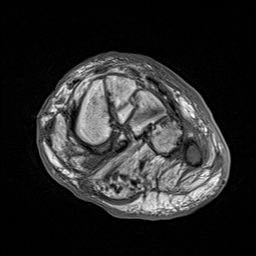
[im 30/40]
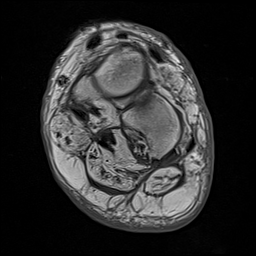
[im 40/40]
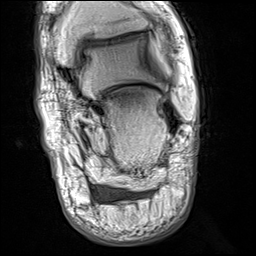

[Series 5: T2 fat-sat · coronal · left · 3.0mm · 0.38mm/px · 5 of 41 slices shown (1 of 2)]
[im 1/41]
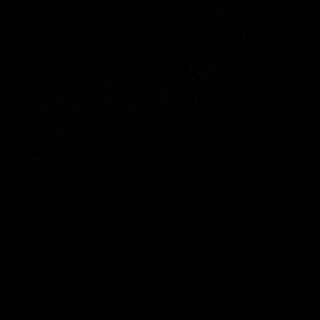
[im 11/41]
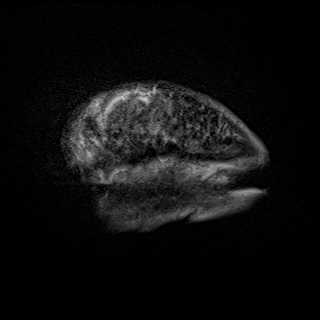
[im 21/41]
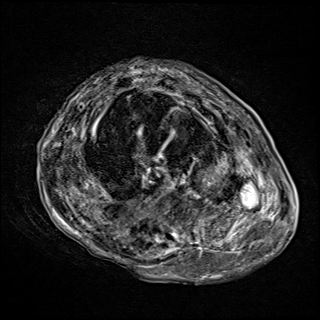
[im 31/41]
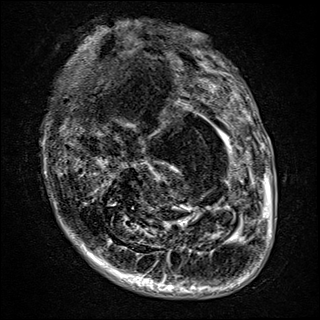
[im 41/41]
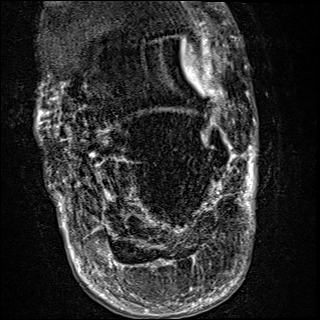

[Series 6: T2 fat-sat · oblique · left · 3.0mm · 0.72mm/px · 4 of 34 slices shown (2 of 2)]
[im 1/34]
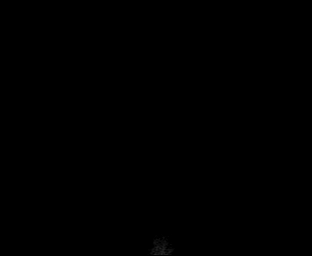
[im 12/34]
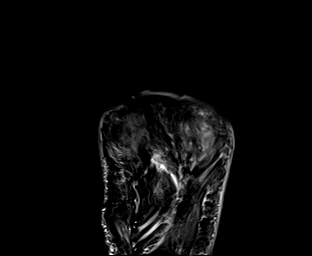
[im 23/34]
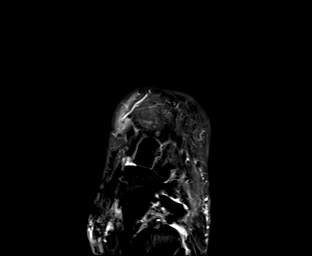
[im 34/34]
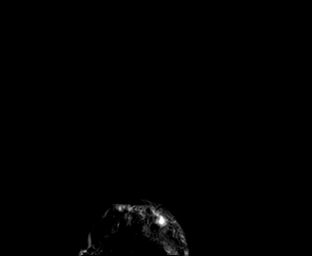

[Series 7: T1 · oblique · left · 3.0mm · 0.70mm/px · 4 of 34 slices shown (2 of 2)]
[im 1/34]
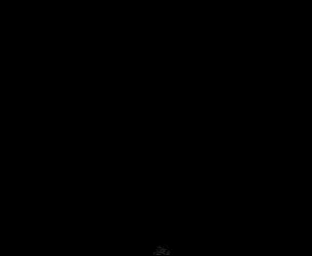
[im 12/34]
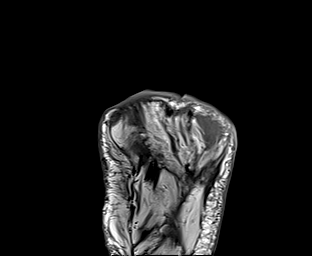
[im 23/34]
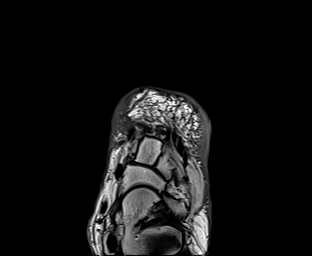
[im 34/34]
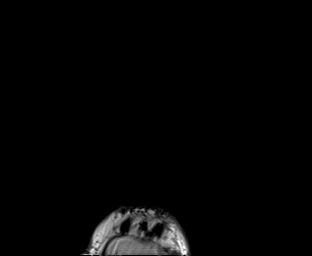

[Series 8: STIR · sagittal · left · 3.0mm · 0.35mm/px · 3 of 34 slices shown]
[im 1/34]
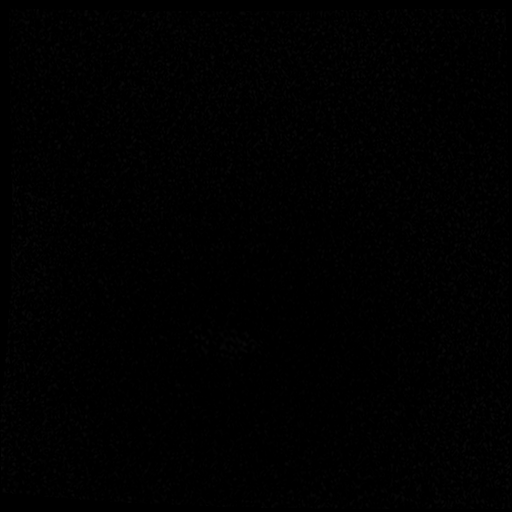
[im 12/34]
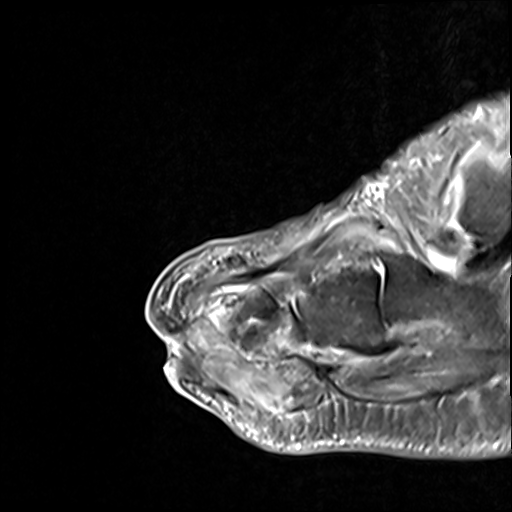
[im 23/34]
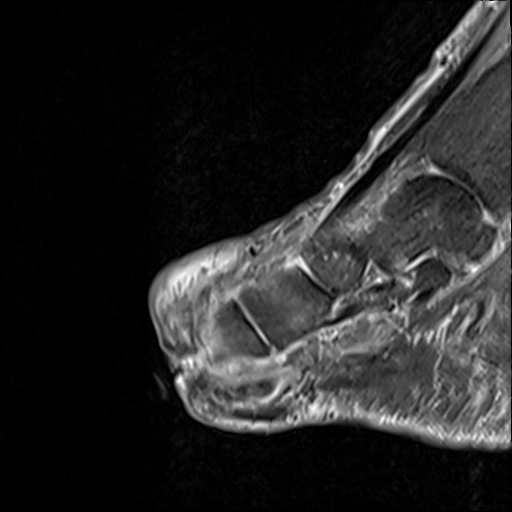

[Series 9: T1 fat-sat · coronal · non-contrast · left · 3.0mm · 0.47mm/px · 5 of 40 slices shown]
[im 1/40]
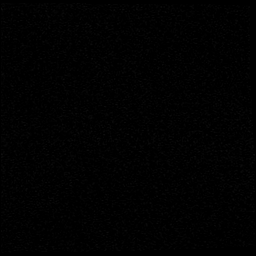
[im 10/40]
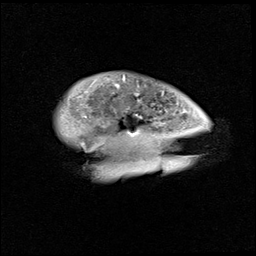
[im 20/40]
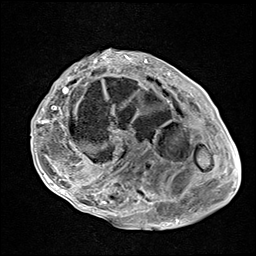
[im 30/40]
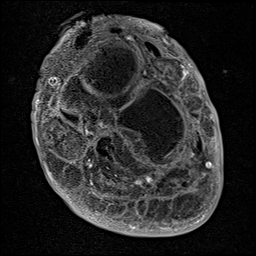
[im 40/40]
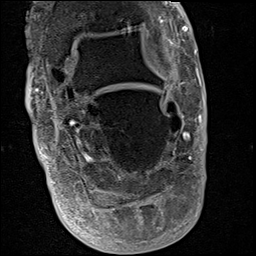

[Series 10: T1 fat-sat post-contrast · coronal · left · 3.0mm · 0.47mm/px · 5 of 37 slices shown (1 of 3)]
[im 1/37]
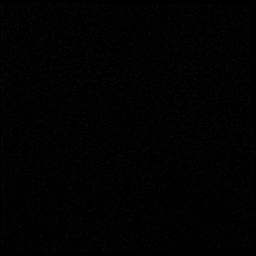
[im 10/37]
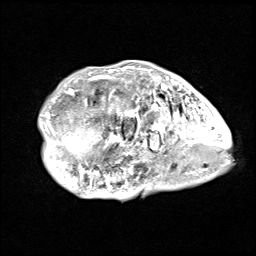
[im 19/37]
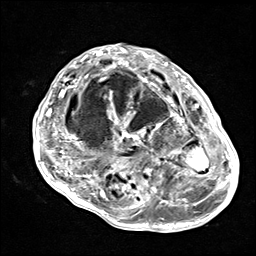
[im 28/37]
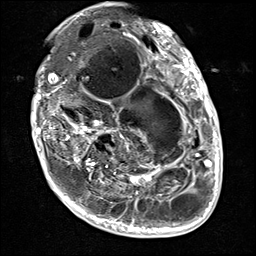
[im 37/37]
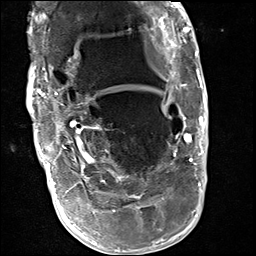

[Series 11: T1 fat-sat post-contrast · sagittal · left · 3.0mm · 0.35mm/px · 4 of 33 slices shown (2 of 3)]
[im 1/33]
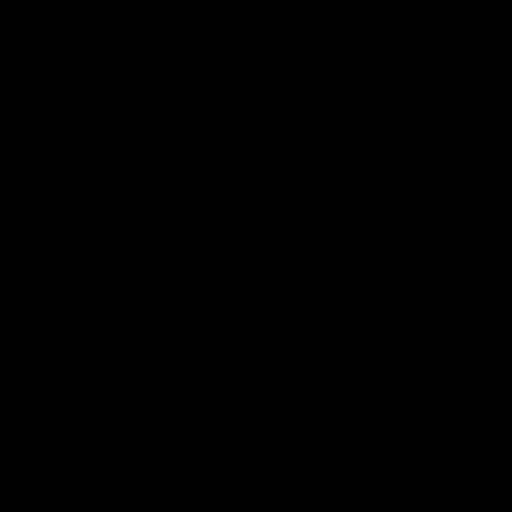
[im 11/33]
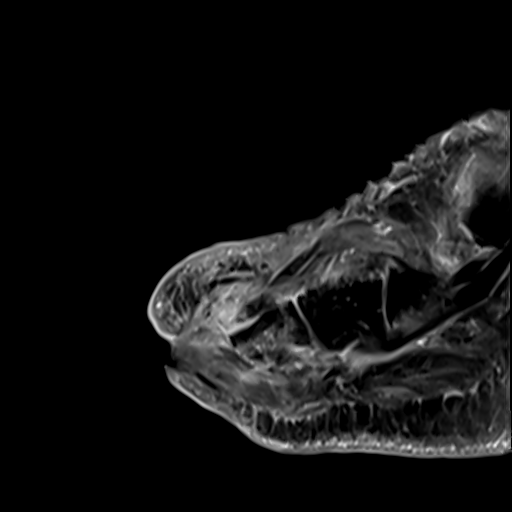
[im 22/33]
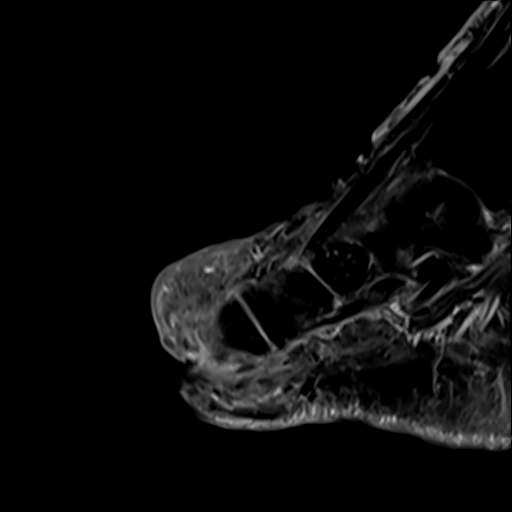
[im 33/33]
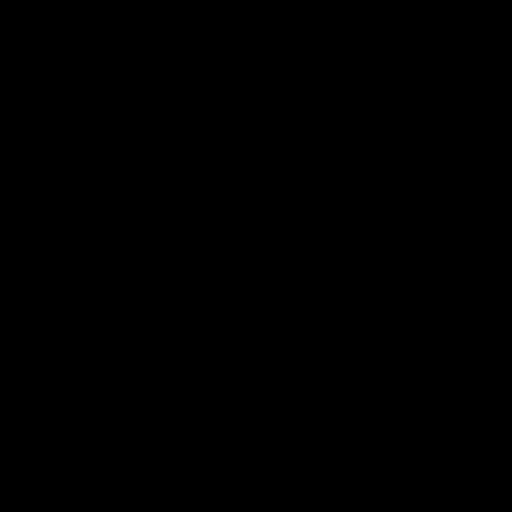

[Series 12: T1 fat-sat post-contrast · oblique · left · 3.0mm · 0.56mm/px · 4 of 34 slices shown (3 of 3)]
[im 1/34]
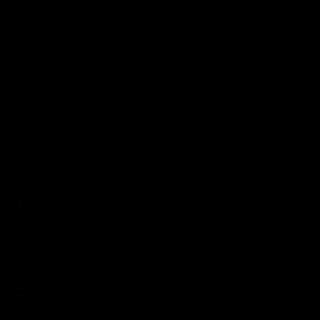
[im 12/34]
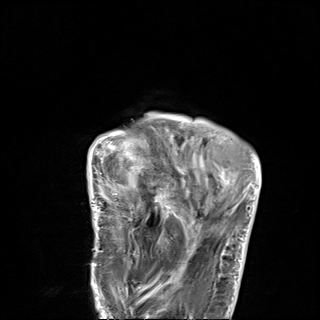
[im 23/34]
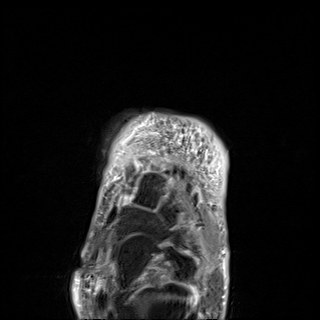
[im 34/34]
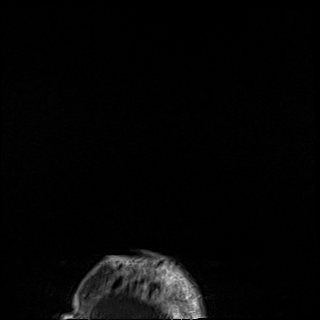

[39 of 40 positions shown; findings below may reference images not displayed]

FINDINGS: Bones/Joint/Cartilage

Prior transmetatarsal amputation of the left foot. Abnormal bone
marrow edema and confluent low T1 signal changes throughout the
residual fifth metatarsal compatible with acute osteomyelitis.
Additionally, there is bone marrow edema within the residual first
through fourth metatarsals with slight irregularity along the
resection margins suggestive of early acute osteomyelitis. No acute
fracture or dislocation. Mild patchy marrow edema within the midfoot
is less prominent compared to prior, and likely degenerative or
stress related. No significant effusion.

Ligaments

Intact Lisfranc ligament.

Muscles and Tendons

Post amputation changes of the distal forefoot. Chronic denervation
changes.

Soft tissues

Skin thickening and soft tissue swelling at the distal stump. Small
rim enhancing abscess along the dorsal-medial margin of the residual
fifth metatarsal diaphysis measuring 11 x 7 x 10 mm. Numerous foci
of susceptibility artifact along the distal stump may be
postsurgical. Soft tissue air not excluded.
IMPRESSION: 1. Acute osteomyelitis throughout the residual fifth metatarsal.
2. Additional findings of early acute osteomyelitis involving the
residual first through fourth metatarsals.
3. Small abscess adjacent to the residual fifth metatarsal measuring
up to 1.1 cm.

## 2022-06-03 MED ORDER — ALBUTEROL SULFATE HFA 108 (90 BASE) MCG/ACT IN AERS: 2.0000 | INHALATION_SPRAY | RESPIRATORY_TRACT | Status: DC | PRN

## 2022-06-03 MED ORDER — ALBUTEROL SULFATE (2.5 MG/3ML) 0.083% IN NEBU
2.5000 mg | INHALATION_SOLUTION | RESPIRATORY_TRACT | Status: DC | PRN
Start: 2022-06-03 — End: 2022-06-08

## 2022-06-03 MED ORDER — GADOBUTROL 1 MMOL/ML IV SOLN
8.0000 mL | Freq: Once | INTRAVENOUS | Status: AC | PRN
  Administered 2022-06-03: 8 mL via INTRAVENOUS

## 2022-06-03 MED ORDER — SERTRALINE HCL 100 MG PO TABS
200.0000 mg | ORAL_TABLET | Freq: Every day | ORAL | Status: DC
  Administered 2022-06-03 – 2022-06-08 (×5): 200 mg via ORAL
  Filled 2022-06-03 (×5): qty 2

## 2022-06-03 MED ORDER — INSULIN GLARGINE-YFGN 100 UNIT/ML ~~LOC~~ SOLN
15.0000 [IU] | Freq: Every day | SUBCUTANEOUS | Status: DC
Start: 2022-06-03 — End: 2022-06-05
  Administered 2022-06-03 – 2022-06-05 (×2): 15 [IU] via SUBCUTANEOUS
  Filled 2022-06-03 (×3): qty 0.15

## 2022-06-03 MED ORDER — KETOROLAC TROMETHAMINE 0.5 % OP SOLN
1.0000 [drp] | Freq: Four times a day (QID) | OPHTHALMIC | Status: DC | PRN
Start: 2022-06-03 — End: 2022-06-08

## 2022-06-03 MED ORDER — MUPIROCIN 2 % EX OINT
1.0000 | TOPICAL_OINTMENT | Freq: Two times a day (BID) | CUTANEOUS | Status: DC
  Administered 2022-06-03 – 2022-06-08 (×9): 1 via NASAL
  Filled 2022-06-03 (×2): qty 22

## 2022-06-03 MED ORDER — VANCOMYCIN HCL IN DEXTROSE 1-5 GM/200ML-% IV SOLN
1000.0000 mg | INTRAVENOUS | Status: DC
  Administered 2022-06-04 – 2022-06-08 (×5): 1000 mg via INTRAVENOUS
  Filled 2022-06-03 (×5): qty 200

## 2022-06-03 MED ORDER — SODIUM CHLORIDE 0.9 % IV SOLN
2.0000 g | Freq: Three times a day (TID) | INTRAVENOUS | Status: DC
  Administered 2022-06-03 – 2022-06-04 (×5): 2 g via INTRAVENOUS
  Administered 2022-06-04: 100 g via INTRAVENOUS
  Administered 2022-06-05 – 2022-06-06 (×4): 2 g via INTRAVENOUS
  Filled 2022-06-03 (×12): qty 12.5

## 2022-06-03 MED ORDER — DICLOFENAC SODIUM 1 % EX GEL
2.0000 g | Freq: Four times a day (QID) | CUTANEOUS | Status: DC
Start: 2022-06-03 — End: 2022-06-08
  Administered 2022-06-03 – 2022-06-08 (×14): 2 g via TOPICAL
  Filled 2022-06-03: qty 100

## 2022-06-03 MED ORDER — GABAPENTIN 300 MG PO CAPS
600.0000 mg | ORAL_CAPSULE | Freq: Three times a day (TID) | ORAL | Status: DC | PRN
  Administered 2022-06-03 – 2022-06-08 (×6): 600 mg via ORAL
  Filled 2022-06-03 (×6): qty 2

## 2022-06-03 MED ORDER — CARVEDILOL 6.25 MG PO TABS: 6.2500 mg | ORAL_TABLET | Freq: Two times a day (BID) | ORAL | Status: DC

## 2022-06-03 MED ORDER — FOLIC ACID 1 MG PO TABS
1.0000 mg | ORAL_TABLET | Freq: Two times a day (BID) | ORAL | Status: DC
  Administered 2022-06-03 – 2022-06-08 (×10): 1 mg via ORAL
  Filled 2022-06-03 (×10): qty 1

## 2022-06-03 MED ORDER — GABAPENTIN 600 MG PO TABS: 600.0000 mg | ORAL_TABLET | Freq: Three times a day (TID) | ORAL | Status: DC | PRN

## 2022-06-03 MED ORDER — CHLORHEXIDINE GLUCONATE CLOTH 2 % EX PADS: 6.0000 | MEDICATED_PAD | Freq: Every day | CUTANEOUS | Status: DC

## 2022-06-03 MED ORDER — PRAVASTATIN SODIUM 20 MG PO TABS
20.0000 mg | ORAL_TABLET | Freq: Every day | ORAL | Status: DC
  Administered 2022-06-03 – 2022-06-07 (×5): 20 mg via ORAL
  Filled 2022-06-03 (×5): qty 1

## 2022-06-03 NOTE — Plan of Care (Signed)

## 2022-06-03 NOTE — TOC Initial Note (Signed)
Transition of Care [facility]) - Initial/Assessment Note   Patient Details  Name: Ariel Arnold MRN: [id number] Date of Birth: 1956-05-16  Transition of Care [facility]) CM/SW Contact:    Ewing Schlein, LCSW Phone Number: 06/03/2022, 1:05 PM  Clinical Narrative: [facility] consulted as patient came from a facility. CSW spoke with patient and daughter and confirmed patient has been at [facility] since April, but has has frequent hospitalizations since admission. Patient and daughter's plan is for the patient to return to the facility to continue rehab provided insurance gives approval. TOC to follow.  Expected Discharge Plan: Skilled Nursing Facility Barriers to Discharge: Continued Medical Work up  Patient Goals and CMS Choice Patient states their goals for this hospitalization and ongoing recovery are:: Return to [facility] in [place] [email or web address] Compare Post Acute Care list provided to:: Patient Choice offered to / list presented to : Patient, Adult Children  Expected Discharge Plan and Services Expected Discharge Plan: Skilled Nursing Facility In-house Referral: Clinical Social Work Discharge Planning Services: NA Post Acute Care Choice: Skilled Nursing Facility Living arrangements for the past 2 months: Skilled Nursing Facility             DME Arranged: N/A DME Agency: NA  Prior Living Arrangements/Services Living arrangements for the past 2 months: Skilled Nursing Facility Lives with:: Facility Resident Patient language and need for interpreter reviewed:: Yes Do you feel safe going back to the place where you live?: Yes      Need for Family Participation in Patient Care: Yes (Comment) Care giver support system in place?: Yes (comment) Criminal Activity/Legal Involvement Pertinent to Current Situation/Hospitalization: No - Comment as needed  Activities of Daily Living Home Assistive Devices/Equipment: Environmental consultant (specify type) (front wheel walker) ADL  Screening (condition at time of admission) Patient's cognitive ability adequate to safely complete daily activities?: No Is the patient deaf or have difficulty hearing?: No Does the patient have difficulty seeing, even when wearing glasses/contacts?: No Does the patient have difficulty concentrating, remembering, or making decisions?: Yes Patient able to express need for assistance with ADLs?: Yes Does the patient have difficulty dressing or bathing?: Yes Independently performs ADLs?: No Communication: Independent Dressing (OT): Needs assistance Is this a change from baseline?: Pre-admission baseline Grooming: Independent Feeding: Independent Bathing: Needs assistance Is this a change from baseline?: Pre-admission baseline Toileting: Needs assistance Is this a change from baseline?: Pre-admission baseline In/Out Bed: Needs assistance Is this a change from baseline?: Pre-admission baseline Walks in Home: Needs assistance Is this a change from baseline?: Pre-admission baseline Does the patient have difficulty walking or climbing stairs?: Yes Weakness of Legs: Left Weakness of Arms/Hands: None  Permission Sought/Granted Permission sought to share information with : Facility Industrial/product designer granted to share information with : Yes, Verbal Permission Granted Permission granted to share info w AGENCY: [facility]  Emotional Assessment Attitude/Demeanor/Rapport: Engaged Affect (typically observed): Accepting Orientation: : Oriented to Self, Oriented to Place, Oriented to  Time, Oriented to Situation Alcohol / Substance Use: Not Applicable Psych Involvement: No (comment)  Admission diagnosis:  Cellulitis of left foot [L03.116] Patient Active Problem List   Diagnosis Date Noted   Cellulitis of left foot 06/02/2022   Ascites 04/05/2022   Cellulitis 04/02/2022   Lactic acidosis 04/02/2022   Chronic osteomyelitis involving ankle and foot, left (HCC) 11/09/2021    Essential hypertension 11/09/2021   Hematemesis    Liver cirrhosis secondary to NASH (HCC) 05/19/2021   Thrombocytopenia (HCC) 05/19/2021   Obesity (  BMI 30-39.9) 02/15/2021   Acquired absence of left foot (HCC) 01/11/2021   Non-healing wound of amputation stump (HCC) 09/09/2020   Coronary atherosclerosis 08/04/2020   Non-prs chronic ulcer oth prt right foot w fat layer exposed (HCC) 12/24/2019   Anemia in chronic kidney disease 12/20/2019   Acquired absence of other left toe(s) (HCC) 11/19/2019   Stage 2 chronic kidney disease due to type 2 diabetes mellitus (HCC) 08/01/2019   Nonalcoholic fatty liver disease 05/07/2019   Type 2 diabetes mellitus treated with insulin (HCC) 05/06/2019   Depressive disorder 04/18/2019   Generalized anxiety disorder 04/18/2019   Seasonal allergic rhinitis 04/18/2019   Arthritis 04/18/2019   Mixed hyperlipidemia 12/19/2018   PCP:  Joaquin Courts, DO Pharmacy:   [facility] [phone] - [place], [place] - [address] [place] [address] Phone: [phone] Fax: [phone]  Readmission Risk Interventions    11/16/2021    9:27 AM  Readmission Risk Prevention Plan  Home Care Screening Complete  Medication Review (RN CM) Complete

## 2022-06-03 NOTE — Progress Notes (Signed)
Pharmacy Antibiotic Note  Ariel Arnold is a 66 y.o. female admitted on 06/02/2022 with medical history significant of HTN type II DM, PVD, [name] leading to liver cirrhosis, chronic pancytopenia, HLD, depression presented to the ER in [place] on 06/01/2022, with complaints of confusion and fever as well as foul-smelling discharge coming from left foot amputation site..  Pharmacy has been consulted to dose vancomycin and cefepime for cellulitis.  Plan: Vancomycin 1500mg  IV x1 then 1gm q24h (AUC 480.9, Scr 0.96) Cefepime 2gm IV q8h Follow renal function, cultures and clinical course  Height: 5\' 6"  (167.6 cm) Weight: 85.6 kg (188 lb 11.4 oz) IBW/kg (Calculated) : 59.3  Temp (24hrs), Avg:98.6 F (37 C), Min:98.6 F (37 C), Max:98.6 F (37 C)  Recent Labs  Lab 06/03/22 0012  WBC 2.3*  CREATININE 0.96    Estimated Creatinine Clearance: 62.7 mL/min (by C-G formula based on SCr of 0.96 mg/dL).    Allergies  Allergen Reactions   Chlorhexidine Rash   Tape Rash    Antimicrobials this admission: 6/16 cefepime >> 6/16 vanc >>  Dose adjustments this admission:   Microbiology results:   Thank you for allowing pharmacy to be a part of this patient's care.  7/16 RPh 06/03/2022, 12:47 AM

## 2022-06-03 NOTE — Consult Note (Addendum)
[facility]       Reason for Consult: osteomyelitis    Referring Physician: Dr. Benjamine Mola  Principal Problem:   Cellulitis of left foot Active Problems:   Liver cirrhosis secondary to NASH (HCC)   Acquired absence of left foot (HCC)   Anemia in chronic kidney disease   Depressive disorder   Mixed hyperlipidemia   Non-healing wound of amputation stump (HCC)   Stage 2 chronic kidney disease due to type 2 diabetes mellitus (HCC)   Type 2 diabetes mellitus treated with insulin (HCC)   Essential hypertension    carvedilol  6.25 mg Oral BID WC   diclofenac Sodium  2 g Topical QID   enoxaparin (LOVENOX) injection  40 mg Subcutaneous Q24H   folic acid  1 mg Oral BID   insulin aspart  0-20 Units Subcutaneous TID WC   insulin aspart  0-5 Units Subcutaneous QHS   insulin glargine-yfgn  15 Units Subcutaneous Daily   pravastatin  20 mg Oral QHS   sertraline  200 mg Oral Daily    Recommendations:  Continue with current antibiotics Will need surgical debridement with source control, per Dr. Allena Katz  Assessment: She has ongoing left foot osteomyelitis with abscess formation associated with poorly-controlled diabetes with a high Hgb A1c.  Previous culture MRSA but difficult to know current infection.  Also with 1 positive blood culture with [facility] from [place].  Will monitor and consider further work up depending on what grows out.    Antibiotics: Vancomycin and ceffepime  HPI: Ariel Arnold is a 66 y.o. female with poorly controlled diabetes, HTN, liver cirrhosis, chronic pancytopenia who initially presented to [facility] ED with left foot drainage at site of previous amputation.  She underwent left 1st metatarsal partial amputation in November 2022 but had remaining infected bone and was treated for MRSA infection with daptomycin for 6 weeks based on operative cultures.  She did not come for outpatient follow up with ID and returned to podiatry on 01/18/22 with an  MRI on 02/14/22 c/w ongoing osteomyelitis.  She was again lost to follow up and presented to the ED in April and had a transmetatarsal amputation.  She again missed follow up appointments and presented to the ED in [place] and transferred here.  MRI was done in [place] and to be again repeated here.  Plan for surgery tomorrow.    Review of Systems:  Constitutional: negative for fevers and chills All other systems reviewed and are negative    Past Medical History:  Diagnosis Date   Anemia    Anxiety    Aortic atherosclerosis (HCC)    Arthritis    AVM (arteriovenous malformation) of colon    Depression    Essential hypertension    GI bleeding    Recurrent   Hepatic encephalopathy (HCC)    History of kidney stones    History of RSV infection    Liver cirrhosis secondary to NASH (HCC) 2004   Biopsy-proven   Obesity    Osteomyelitis (HCC)    Peripheral vascular disease (HCC)    Thrombocytopenia (HCC)    Type 2 diabetes mellitus (HCC)     Social History   Tobacco Use   Smoking status: Former    Types: Cigarettes   Smokeless tobacco: Never  Vaping Use   Vaping Use: Never used  Substance Use Topics   Alcohol use: Yes    Comment: wine rare   Drug use: Never    Family History  Problem Relation Age of Onset   Heart failure Mother    Hyperlipidemia Mother    Diabetes Mellitus II Mother    Heart failure Father    Hyperlipidemia Father     Allergies  Allergen Reactions   Chlorhexidine Rash   Tape Rash    Physical Exam: Constitutional: in no apparent distress  Vitals:   06/03/22 0604 06/03/22 0946  BP: (!) 106/46 (!) 120/44  Pulse: 70 72  Resp: 17 18  Temp: 98.3 F (36.8 C) 98 F (36.7 C)  SpO2: 98% 100%   EYES: anicteric Respiratory: normal respiratory effort Musculoskeletal: foot wrapped  Lab Results  Component Value Date   WBC 2.3 (L) 06/03/2022   HGB 7.9 (L) 06/03/2022   HCT 25.5 (L) 06/03/2022   MCV 104.5 (H) 06/03/2022   PLT 83 (L) 06/03/2022     Lab Results  Component Value Date   CREATININE 0.96 06/03/2022   BUN 24 (H) 06/03/2022   NA 136 06/03/2022   K 3.7 06/03/2022   CL 108 06/03/2022   CO2 20 (L) 06/03/2022    Lab Results  Component Value Date   ALT 26 06/03/2022   AST 34 06/03/2022   ALKPHOS 96 06/03/2022     Microbiology: Recent Results (from the past 240 hour(s))  MRSA Next Gen by PCR, Nasal     Status: Abnormal   Collection Time: 06/03/22  6:05 AM   Specimen: Nasal Mucosa; Nasal Swab  Result Value Ref Range Status   MRSA by PCR Next Gen DETECTED (A) NOT DETECTED Final    Comment: RESULT CALLED TO, READ BACK BY AND VERIFIED WITH: HALL, J. RN AT 0827 ON 06/03/2022 BY MECIAL J. (NOTE) The GeneXpert MRSA Assay (FDA approved for NASAL specimens only), is one component of a comprehensive MRSA colonization surveillance program. It is not intended to diagnose MRSA infection nor to guide or monitor treatment for MRSA infections. Test performance is not FDA approved in patients less than 46 years old. Performed at [facility], [address] [place], [place] [address]     Gardiner Barefoot, MD [facility] [email or web address] 06/03/2022, 11:09 AM

## 2022-06-03 NOTE — Progress Notes (Signed)
Patient admitted after midnight, please see H&P.  Here with gram + bacteremia with source most likely being Left transmetatarsal amputation wound with fat layer exposed.  Podiatry to see as well as ID. -will need MRI here Marlin Canary DO

## 2022-06-03 NOTE — Consult Note (Signed)
  Subjective:  Patient ID: Ariel Arnold, female    DOB: 17-Mar-1956,  MRN: [id number]  A 66 y.o. female medical history significant of HTN type II DM, PVD, [name] leading to liver cirrhosis, chronic pancytopenia, HLD, depression presented to the ER in [place] on 06/01/2022 for left transmetatarsal foot amputation site infection.  She states that she is doing okay.  No nausea fever chills vomiting.  She met the sepsis criteria in the ED but doing much better today.  She had an MRI done over at the hospital  Objective:   Vitals:   06/03/22 0150 06/03/22 0604  BP: (!) 107/52 (!) 106/46  Pulse: 73 70  Resp: 17 17  Temp: 98.1 F (36.7 C) 98.3 F (36.8 C)  SpO2: 100% 98%   General AA&O x3. Normal mood and affect.  Vascular Dorsalis pedis and posterior tibial pulses 2/4 bilat. Brisk capillary refill to all digits. Pedal hair present.  Neurologic Epicritic sensation grossly intact.  Dermatologic Left transmetatarsal dehiscence wound granular wound bed with some fibrotic patches.  No signs of infection noted no purulent drainage noted.  No malodor present.  No redness noted.  Orthopedic: MMT 5/5 in dorsiflexion, plantarflexion, inversion, and eversion. Normal joint ROM without pain or crepitus.    Assessment & Plan:  Patient was evaluated and treated and all questions answered.  Left transmetatarsal amputation wound with fat layer exposed -All questions and concerns were discussed with the patient in extensive detail -I discussed with the patient she will benefit from debridement of the wound with application of Integra. -I will plan on taking him to the operating room tomorrow for debridement with possible application of Integra with a possible wound VAC -I am awaiting MRI read/obtaining a new MRI at this time. -Nonweightbearing to the left lower extremity -Betadine wet-to-dry dressing change daily -Antibiotics per primary team  Felipa Furnace, DPM  Accessible via secure chat for  questions or concerns.

## 2022-06-03 NOTE — H&P (Signed)
History and Physical    Patient: Ariel Arnold GGE:[id number] DOB: 1956/04/07 DOA: 06/02/2022 DOS: the patient was seen and examined on 06/03/2022 PCP: Jacqualine Code, DO  Patient coming from: Home  Chief Complaint: No chief complaint on file.  HPI: ARIHANA AMBROCIO is a 66 y.o. female with medical history significant of HTN type II DM, PVD, [name] leading to liver cirrhosis, chronic pancytopenia, HLD, depression presented to the ER in [place] on 06/01/2022, with complaints of confusion and fever as well as foul-smelling discharge coming from left foot amputation site. Patient had left transmetatarsal amputation by Dr. Blenda Mounts on 04/05/2022 for osteomyelitis.  Patient was admitted to the floor over there. On admission patient had a temperature of 102 meeting sepsis criteria. Patient was treated with IV vancomycin cefepime and Flagyl later on transition to IV Rocephin and Flagyl. Patient also had acute encephalopathy, suspected to have hepatic origin.  Treated with lactulose with improvement in mentation within 24 hours.  X-ray showed evidence of osteomyelitis.  Surgery team there felt that patient may not have osteomyelitis but recommended MRI to confirm. Creatinine 1.0. blood culture no growth in 24 hours.  She was subsequently transferred here for evaluation by podiatry.  She is fully awake alert now.  No evidence of mental status changes.  Patient is going to be admitted to the medical service therefore I will give podiatry consultation soon as practicable  Review of Systems: As mentioned in the history of present illness. All other systems reviewed and are negative. Past Medical History:  Diagnosis Date   Anemia    Anxiety    Aortic atherosclerosis (HCC)    Arthritis    AVM (arteriovenous malformation) of colon    Depression    Essential hypertension    GI bleeding    Recurrent   Hepatic encephalopathy ([place])    History of kidney stones    History of RSV infection    Liver  cirrhosis secondary to NASH ([place]) 2004   Biopsy-proven   Obesity    Osteomyelitis ([place])    Peripheral vascular disease (HCC)    Thrombocytopenia ([place])    Type 2 diabetes mellitus ([place])    Past Surgical History:  Procedure Laterality Date   ABDOMINAL HYSTERECTOMY     AMPUTATION Left 04/05/2022   Procedure: Metatarsal amputation revision;  Surgeon: Lorenda Peck, MD;  Location: [facility];  Service: Podiatry;  Laterality: Left;  Surgical team to do block   COLONOSCOPY     Cystoscopy with ureteral stent     ESOPHAGOGASTRODUODENOSCOPY     ESOPHAGOGASTRODUODENOSCOPY (EGD) WITH PROPOFOL N/A 05/20/2021   Procedure: ESOPHAGOGASTRODUODENOSCOPY (EGD) WITH PROPOFOL;  Surgeon: Daneil Dolin, MD;  [facility]: [facility];  Service: Endoscopy;  Laterality: N/A;  needs intubation   IR ANGIOGRAM FOLLOW UP STUDY  05/21/2021   IR ANGIOGRAM SELECTIVE EACH ADDITIONAL VESSEL  05/21/2021   IR EMBO VENOUS NOT HEMORR HEMANG  INC GUIDE ROADMAPPING  05/21/2021   IR US GUIDE VASC ACCESS RIGHT  05/21/2021   IR VENOGRAM RENAL UNI LEFT  05/21/2021   RADIOLOGY WITH ANESTHESIA N/A 05/21/2021   Procedure: IR WITH ANESTHESIA;  Surgeon: Radiologist, Medication, MD;  Location: [facility];  Service: Radiology;  Laterality: N/A;   Toe amputations Bilateral    TUBAL LIGATION     WOUND DEBRIDEMENT Left 11/12/2021   Procedure: DEBRIDEMENT WOUND;FIRST METATARSAL RESECTION; APPLICATION OF SKIN GRAFT SUBSTITUTE;  Surgeon: Evelina Bucy, DPM;  Location: [facility];  Service: Podiatry;  Laterality: Left;   Social  History:  reports that she has quit smoking. Her smoking use included cigarettes. She has never used smokeless tobacco. She reports current alcohol use. She reports that she does not use drugs.  Allergies  Allergen Reactions   Chlorhexidine Rash   Tape Rash    Family History  Problem Relation Age of Onset   Heart failure Mother    Hyperlipidemia Mother    Diabetes Mellitus II Mother    Heart failure Father    Hyperlipidemia  Father     Prior to Admission medications   Medication Sig Start Date End Date Taking? Authorizing Provider  ACCU-CHEK GUIDE test strip  08/27/21   [provider]  Accu-Chek Softclix Lancets lancets SMARTSIG:2 Topical Twice Daily 08/27/21   [provider]  albuterol (VENTOLIN HFA) 108 (90 Base) MCG/ACT inhaler Inhale 2 puffs into the lungs every 4 (four) hours as needed for shortness of breath or wheezing. 01/11/21   [provider]  Blood Glucose Monitoring Suppl (ACCU-CHEK GUIDE) w/Device KIT  08/27/21   [provider]  Calcium Carbonate-Vit D-Min (CALCIUM 1200) 1200-1000 MG-UNIT CHEW Chew 1 tablet by mouth daily.    [provider]  carvedilol (COREG) 6.25 MG tablet Take 6.25 mg by mouth 2 (two) times daily with a meal. 04/01/22   [provider]  carvedilol (COREG) 6.25 MG tablet Take 1 tablet (6.25 mg total) by mouth 2 (two) times daily. 04/11/22 05/11/22  [name], MD  clotrimazole-betamethasone (LOTRISONE) cream Apply topically 2 (two) times daily. 11/25/21   [provider]  diclofenac Sodium (VOLTAREN) 1 % GEL Apply 2 g topically in the morning and at bedtime. 12/02/21   [provider]  ferrous sulfate 325 (65 FE) MG tablet Take 325 mg by mouth in the morning and at bedtime.    [provider]  folic acid (FOLVITE) 1 MG tablet Take 1 mg by mouth 2 (two) times daily. 06/02/21   [provider]  furosemide (LASIX) 20 MG tablet Take 20 mg by mouth daily as needed for fluid. 05/27/21   [provider]  gabapentin (NEURONTIN) 600 MG tablet Take 600 mg by mouth 3 (three) times daily as needed (nerve pain). 07/23/21   [provider]  GLOBAL EASE INJECT PEN NEEDLES 32G X 4 MM MISC  08/27/21   [provider]  HUMALOG KWIKPEN 100 UNIT/ML KwikPen Inject 6 Units into the skin in the morning, at noon, in the evening, and at bedtime. 12/09/21   [provider]  ketorolac (ACULAR)  0.5 % ophthalmic solution Place 1 drop into both eyes 4 (four) times daily as needed (dry eyes). 09/06/21   [provider]  pantoprazole (PROTONIX) 40 MG tablet Take 40 mg by mouth daily as needed (heartburn). 05/27/21   [provider]  pravastatin (PRAVACHOL) 20 MG tablet Take 1 tablet (20 mg total) by mouth at bedtime. Take after completion of daptomycin 01/03/22   [name], [name], MD  sertraline (ZOLOFT) 100 MG tablet Take 1 tablet (100 mg total) by mouth daily. 04/12/22 05/12/22  [name], MD  spironolactone (ALDACTONE) 50 MG tablet Take 50 mg by mouth daily as needed (swelling). 05/27/21   [provider]  TOUJEO MAX SOLOSTAR 300 UNIT/ML Solostar Pen Inject 68 Units into the skin at bedtime. 06/03/21   [provider]  vitamin C (ASCORBIC ACID) 500 MG tablet Take 500 mg by mouth 2 (two) times daily.    [provider]  Vitamin D, Ergocalciferol, (DRISDOL) 1.25  MG (50000 UNIT) CAPS capsule Take 1 capsule by mouth every 7 (seven) days. Mondays 04/05/21   [provider]    Physical Exam: Vitals:   06/02/22 2244  BP: (!) 117/53  Pulse: 75  Resp: 17  Temp: 98.6 F (37 C)  TempSrc: Oral  SpO2: 98%  Weight: 85.6 kg  Height: $Remove'5\' 6"'pNGMUkT$  (1.676 m)   General: Acutely ill looking: Weak, no distress, fully awake HEENT: PERRL, EOMI, no pallor or jaundice Neck: Supple, no JVD no lymphadenopathy Respiratory: Good air entry bilaterally no wheeze rales or crackles Cardiovascular system: Regular rate and rhythm Abdomen: Soft, nontender with positive bowel sounds Extremities: Left foot stump currently dressed.  Draining fluid soaking the dressing.  Right foot intact Musculoskeletal: Status post left forefoot amputation.  No other joint deformities Neuro: Nonfocal at the moment.  Patient fully awake and alert. Skin exam: No other ulcers.  Data Reviewed:  There are no new results to review at this time.  Assessment and Plan:   #1 left foot  wound infection: Patient will be admitted and continued on IV Vanco and cefepime.  MRI of the left foot and podiatry assessment.  Decision by podiatry will be followed.  #2 NASH cirrhosis: Patient apparently had hepatic encephalopathy which has resolved.  Continue monitoring  #3 essential hypertension: Continue home regimen.  #4 type 2 diabetes: Poorly controlled.  Hemoglobin A1c more than 11.  Continue with sliding scale insulin and home regimen.  #5 chronic kidney disease stage II: Continue monitoring  #6 mixed hyperlipidemia: Confirm home regimen and resume  #7 GERD: Continue PPIs    Advance Care Planning:   Code Status: Full Code full code  Consults: Podiatry consult in the morning  Family Communication: No family at bedside  Severity of Illness: The appropriate patient status for this patient is INPATIENT. Inpatient status is judged to be reasonable and necessary in order to provide the required intensity of service to ensure the patient's safety. The patient's presenting symptoms, physical exam findings, and initial radiographic and laboratory data in the context of their chronic comorbidities is felt to place them at high risk for further clinical deterioration. Furthermore, it is not anticipated that the patient will be medically stable for discharge from the hospital within 2 midnights of admission.   * I certify that at the point of admission it is my clinical judgment that the patient will require inpatient hospital care spanning beyond 2 midnights from the point of admission due to high intensity of service, high risk for further deterioration and high frequency of surveillance required.*  AuthorBarbette Merino, MD 06/03/2022 12:09 AM  For on call review [email or web address].

## 2022-06-04 ENCOUNTER — Other Ambulatory Visit: Payer: Self-pay

## 2022-06-04 ENCOUNTER — Inpatient Hospital Stay (HOSPITAL_COMMUNITY): Payer: Medicare Other | Admitting: Anesthesiology

## 2022-06-04 ENCOUNTER — Inpatient Hospital Stay (HOSPITAL_COMMUNITY): Payer: Medicare Other

## 2022-06-04 ENCOUNTER — Encounter (HOSPITAL_COMMUNITY)
Admission: AD | Disposition: A | Discharge status: Skilled Nursing Facility | Payer: Self-pay | Source: Other Acute Inpatient Hospital | Attending: Internal Medicine

## 2022-06-04 DIAGNOSIS — M869 Osteomyelitis, unspecified: Secondary | ICD-10-CM

## 2022-06-04 DIAGNOSIS — E1169 Type 2 diabetes mellitus with other specified complication: Secondary | ICD-10-CM

## 2022-06-04 DIAGNOSIS — L03116 Cellulitis of left lower limb: Secondary | ICD-10-CM

## 2022-06-04 DIAGNOSIS — Z794 Long term (current) use of insulin: Secondary | ICD-10-CM

## 2022-06-04 DIAGNOSIS — L089 Local infection of the skin and subcutaneous tissue, unspecified: Secondary | ICD-10-CM

## 2022-06-04 HISTORY — PX: IRRIGATION AND DEBRIDEMENT FOOT: SHX6602

## 2022-06-04 LAB — GLUCOSE, CAPILLARY
Glucose-Capillary: 147 mg/dL — ABNORMAL HIGH (ref 70–99)
Glucose-Capillary: 119 mg/dL — ABNORMAL HIGH (ref 70–99)
Glucose-Capillary: 149 mg/dL — ABNORMAL HIGH (ref 70–99)
Glucose-Capillary: 193 mg/dL — ABNORMAL HIGH (ref 70–99)
Glucose-Capillary: 209 mg/dL — ABNORMAL HIGH (ref 70–99)

## 2022-06-04 IMAGING — DX DG FOOT COMPLETE 3+V*L*
2 series · 2 of 2 positions shown · non-contrast
Comparison: [DATE]

CLINICAL DATA: Transmetatarsal amputation revision today.

EXAM:
LEFT FOOT - COMPLETE 3+ VIEW

[foot obl]
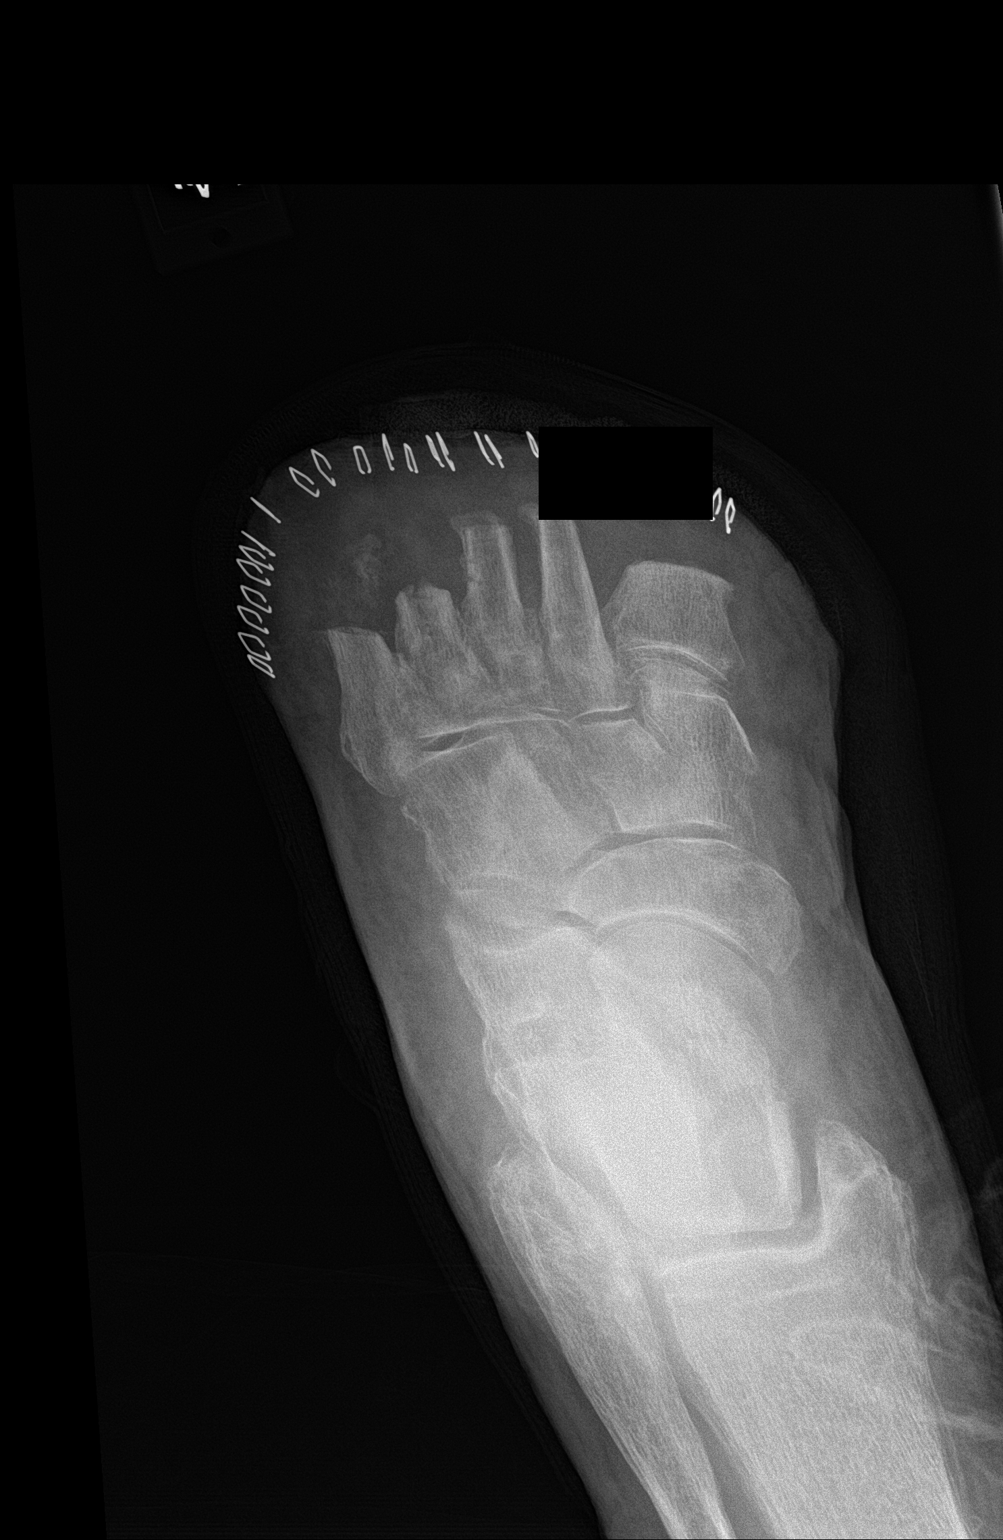

[foot lat]
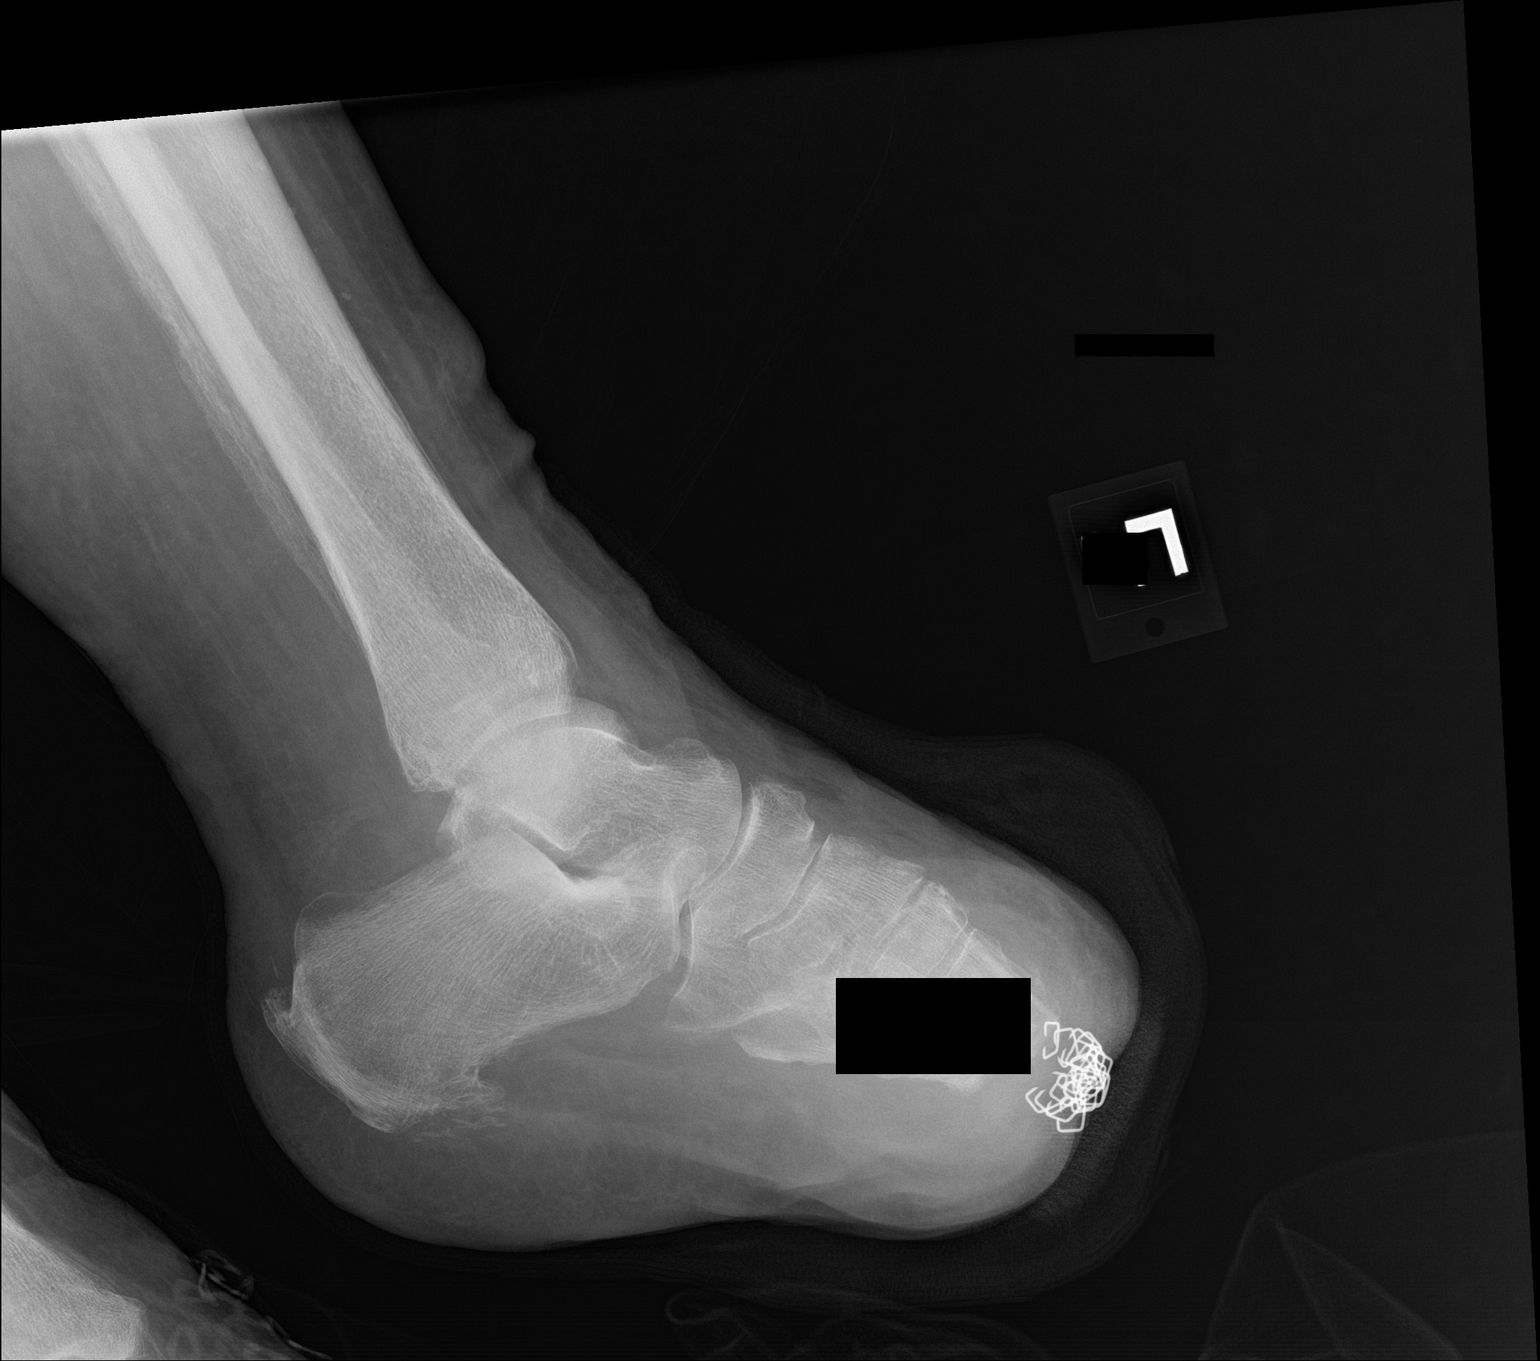

[2 of 2 positions shown; findings below may reference images not displayed]

FINDINGS: Examination demonstrates evidence of patient's recent
transmetatarsal amputation of toes 1 through 5. Evidence of interval
revision of the fifth transmetatarsal amputation site. Skin staples
over the distal soft tissues. Exam is otherwise unchanged.
IMPRESSION: Evidence of patient's recent transmetatarsal amputation of toes 1
through 5 with interval revision of the fifth transmetatarsal
amputation site.

## 2022-06-04 SURGERY — IRRIGATION AND DEBRIDEMENT FOOT
Anesthesia: General | Site: Foot | Laterality: Left

## 2022-06-04 MED ORDER — 0.9 % SODIUM CHLORIDE (POUR BTL) OPTIME
TOPICAL | Status: DC | PRN
  Administered 2022-06-04: 1000 mL

## 2022-06-04 MED ORDER — ONDANSETRON HCL 4 MG/2ML IJ SOLN
INTRAMUSCULAR | Status: DC | PRN
  Administered 2022-06-04: 4 mg via INTRAVENOUS

## 2022-06-04 MED ORDER — LIDOCAINE HCL 2 % IJ SOLN
INTRAMUSCULAR | Status: AC
  Filled 2022-06-04: qty 20

## 2022-06-04 MED ORDER — LIDOCAINE 2% (20 MG/ML) 5 ML SYRINGE
INTRAMUSCULAR | Status: DC | PRN
  Administered 2022-06-04: 40 mg via INTRAVENOUS

## 2022-06-04 MED ORDER — PHENYLEPHRINE HCL-NACL 20-0.9 MG/250ML-% IV SOLN
INTRAVENOUS | Status: DC | PRN
  Administered 2022-06-04: 40 ug/min via INTRAVENOUS

## 2022-06-04 MED ORDER — LIDOCAINE HCL 2 % IJ SOLN
INTRAMUSCULAR | Status: DC | PRN
  Administered 2022-06-04: 5 mL

## 2022-06-04 MED ORDER — BUPIVACAINE HCL (PF) 0.5 % IJ SOLN
INTRAMUSCULAR | Status: AC
  Filled 2022-06-04: qty 30

## 2022-06-04 MED ORDER — PHENYLEPHRINE HCL (PRESSORS) 10 MG/ML IV SOLN
INTRAVENOUS | Status: AC
  Filled 2022-06-04: qty 1

## 2022-06-04 MED ORDER — PROPOFOL 10 MG/ML IV BOLUS
INTRAVENOUS | Status: DC | PRN
  Administered 2022-06-04: 100 mg via INTRAVENOUS

## 2022-06-04 MED ORDER — KETOROLAC TROMETHAMINE 30 MG/ML IJ SOLN
INTRAMUSCULAR | Status: AC
  Filled 2022-06-04: qty 1

## 2022-06-04 MED ORDER — MIDAZOLAM HCL 2 MG/2ML IJ SOLN
INTRAMUSCULAR | Status: AC
  Filled 2022-06-04: qty 2

## 2022-06-04 MED ORDER — LACTATED RINGERS IV SOLN: INTRAVENOUS | Status: DC | PRN

## 2022-06-04 MED ORDER — PHENYLEPHRINE HCL (PRESSORS) 10 MG/ML IV SOLN
INTRAVENOUS | Status: DC | PRN
  Administered 2022-06-04: 240 ug via INTRAVENOUS

## 2022-06-04 MED ORDER — FENTANYL CITRATE PF 50 MCG/ML IJ SOSY: 25.0000 ug | PREFILLED_SYRINGE | INTRAMUSCULAR | Status: DC | PRN

## 2022-06-04 MED ORDER — BUPIVACAINE HCL (PF) 0.5 % IJ SOLN
INTRAMUSCULAR | Status: DC | PRN
  Administered 2022-06-04: 5 mL

## 2022-06-04 MED ORDER — FENTANYL CITRATE (PF) 100 MCG/2ML IJ SOLN
INTRAMUSCULAR | Status: AC
  Filled 2022-06-04: qty 2

## 2022-06-04 MED ORDER — RISAQUAD PO CAPS
1.0000 | ORAL_CAPSULE | Freq: Every day | ORAL | Status: DC
Start: 2022-06-04 — End: 2022-06-08
  Administered 2022-06-05 – 2022-06-08 (×4): 1 via ORAL
  Filled 2022-06-04 (×4): qty 1

## 2022-06-04 MED ORDER — FENTANYL CITRATE (PF) 250 MCG/5ML IJ SOLN
INTRAMUSCULAR | Status: DC | PRN
  Administered 2022-06-04: 50 ug via INTRAVENOUS

## 2022-06-04 SURGICAL SUPPLY — 54 items
BAG COUNTER SPONGE SURGICOUNT (BAG) IMPLANT
BLADE OSCILLATING/SAGITTAL (BLADE) ×1
BLADE SW THK.38XMED LNG THN (BLADE) IMPLANT
BNDG ELASTIC 3X5.8 VLCR STR LF (GAUZE/BANDAGES/DRESSINGS) IMPLANT
BNDG ELASTIC 4X5.8 VLCR STR LF (GAUZE/BANDAGES/DRESSINGS) ×2 IMPLANT
BNDG ESMARK 4X9 LF (GAUZE/BANDAGES/DRESSINGS) IMPLANT
BNDG GAUZE DERMACEA FLUFF (GAUZE/BANDAGES/DRESSINGS) ×2
BNDG GAUZE DERMACEA FLUFF 4 (GAUZE/BANDAGES/DRESSINGS) ×2 IMPLANT
CHLORAPREP W/TINT 26 (MISCELLANEOUS) ×1 IMPLANT
COVER MAYO STAND STRL (DRAPES) ×2 IMPLANT
CUFF TOURN SGL QUICK 34 (TOURNIQUET CUFF)
CUFF TRNQT CYL 34X4.125X (TOURNIQUET CUFF) IMPLANT
DRAPE IMP U-DRAPE 54X76 (DRAPES) ×2 IMPLANT
DRAPE SURG 17X23 STRL (DRAPES) IMPLANT
DRAPE U-SHAPE 47X51 STRL (DRAPES) ×2 IMPLANT
DRSG EMULSION OIL 3X3 NADH (GAUZE/BANDAGES/DRESSINGS) ×1 IMPLANT
GAUZE PACKING IODOFORM 1/2 (PACKING) ×1 IMPLANT
GAUZE PAD ABD 8X10 STRL (GAUZE/BANDAGES/DRESSINGS) ×1 IMPLANT
GAUZE SPONGE 4X4 12PLY STRL (GAUZE/BANDAGES/DRESSINGS) ×2 IMPLANT
GAUZE XEROFORM 1X8 LF (GAUZE/BANDAGES/DRESSINGS) ×1 IMPLANT
GLOVE BIO SURGEON STRL SZ7 (GLOVE) ×2 IMPLANT
GLOVE BIOGEL PI IND STRL 7.5 (GLOVE) ×1 IMPLANT
GLOVE BIOGEL PI INDICATOR 7.5 (GLOVE) ×1
GOWN STRL REUS W/ TWL XL LVL3 (GOWN DISPOSABLE) IMPLANT
GOWN STRL REUS W/TWL XL LVL3 (GOWN DISPOSABLE)
KIT BASIN OR (CUSTOM PROCEDURE TRAY) ×2 IMPLANT
MICROMATRIX 1000MG (Tissue) ×2 IMPLANT
NDL BIOPSY JAMSHIDI 11X6 (NEEDLE) IMPLANT
NDL HYPO 25X1 1.5 SAFETY (NEEDLE) ×1 IMPLANT
NDL SAFETY ECLIPSE 18X1.5 (NEEDLE) IMPLANT
NEEDLE BIOPSY JAMSHIDI 11X6 (NEEDLE) IMPLANT
NEEDLE HYPO 18GX1.5 SHARP (NEEDLE) ×1
NEEDLE HYPO 25X1 1.5 SAFETY (NEEDLE) IMPLANT
NS IRRIG 1000ML POUR BTL (IV SOLUTION) ×2 IMPLANT
PACK ORTHO EXTREMITY (CUSTOM PROCEDURE TRAY) ×2 IMPLANT
PADDING CAST ABS 4INX4YD NS (CAST SUPPLIES)
PADDING CAST ABS COTTON 4X4 ST (CAST SUPPLIES) ×2 IMPLANT
PENCIL SMOKE EVACUATOR (MISCELLANEOUS) ×2 IMPLANT
SOLUTION PARTIC MCRMTRX 1000MG (Tissue) IMPLANT
STAPLER VISISTAT 35W (STAPLE) ×1 IMPLANT
STOCKINETTE 6  STRL (DRAPES)
STOCKINETTE 6 STRL (DRAPES) IMPLANT
SUT MNCRL AB 3-0 PS2 18 (SUTURE) IMPLANT
SUT MNCRL AB 4-0 PS2 18 (SUTURE) IMPLANT
SUT MON AB 5-0 PS2 18 (SUTURE) IMPLANT
SUT PROLENE 3 0 PS 2 (SUTURE) ×1 IMPLANT
SUT PROLENE 4 0 PS 2 18 (SUTURE) IMPLANT
SUT VIC AB 4-0 P2 18 (SUTURE) IMPLANT
SWAB COLLECTION DEVICE MRSA (MISCELLANEOUS) ×2 IMPLANT
SWAB CULTURE ESWAB REG 1ML (MISCELLANEOUS) ×1 IMPLANT
SYR CONTROL 10ML LL (SYRINGE) ×1 IMPLANT
TOWEL OR 17X26 10 PK STRL BLUE (TOWEL DISPOSABLE) ×2 IMPLANT
UNDERPAD 30X36 HEAVY ABSORB (UNDERPADS AND DIAPERS) ×2 IMPLANT
YANKAUER SUCT BULB TIP 10FT TU (MISCELLANEOUS) ×1 IMPLANT

## 2022-06-04 NOTE — Anesthesia Preprocedure Evaluation (Addendum)
Anesthesia Evaluation  Patient identified by MRN, date of birth, ID band Patient awake    Reviewed: Allergy & Precautions, NPO status , Patient's Chart, lab work & pertinent test results, reviewed documented beta blocker date and time   Airway Mallampati: II  TM Distance: >3 FB Neck ROM: Full    Dental no notable dental hx. (+) Missing, Chipped,    Pulmonary neg pulmonary ROS, former smoker,    Pulmonary exam normal breath sounds clear to auscultation       Cardiovascular hypertension, Pt. on home beta blockers and Pt. on medications + CAD and + Peripheral Vascular Disease  Normal cardiovascular exam Rhythm:Regular Rate:Normal  TTE 2022 Normal EF, mild MR   Neuro/Psych PSYCHIATRIC DISORDERS Anxiety Depression negative neurological ROS     GI/Hepatic negative GI ROS, (+) Cirrhosis  (NASH cirrhosis)      ,   Endo/Other  diabetes, Type 2, Insulin Dependent  Renal/GU Renal InsufficiencyRenal diseaseLab Results      Component                Value               Date                      CREATININE               0.96                06/03/2022                BUN                      24 (H)              06/03/2022                NA                       136                 06/03/2022                K                        3.7                 06/03/2022                CL                       108                 06/03/2022                CO2                      20 (L)              06/03/2022             negative genitourinary   Musculoskeletal  (+) Arthritis ,   Abdominal   Peds  Hematology  (+) Blood dyscrasia, anemia , Lab Results      Component                Value  Date                      WBC                      2.3 (L)             06/03/2022                HGB                      7.9 (L)             06/03/2022                HCT                      25.5 (L)            06/03/2022                 MCV                      104.5 (H)           06/03/2022                PLT                      83 (L)              06/03/2022              Anesthesia Other Findings   Reproductive/Obstetrics                            Anesthesia Physical Anesthesia Plan  ASA: 3  Anesthesia Plan:    Post-op Pain Management: Ofirmev IV (intra-op)*   Induction:   PONV Risk Score and Plan: 2 and Midazolam and Ondansetron  Airway Management Planned:   Additional Equipment:   Intra-op Plan:   Post-operative Plan:   Informed Consent: I have reviewed the patients History and Physical, chart, labs and discussed the procedure including the risks, benefits and alternatives for the proposed anesthesia with the patient or authorized representative who has indicated his/her understanding and acceptance.     Dental advisory given  Plan Discussed with: CRNA and Anesthesiologist  Anesthesia Plan Comments:         Anesthesia Quick Evaluation

## 2022-06-04 NOTE — Plan of Care (Signed)
  Problem: Pain Managment: Goal: General experience of comfort will improve Outcome: Progressing   Problem: Coping: Goal: Level of anxiety will decrease Outcome: Progressing   

## 2022-06-04 NOTE — H&P (Signed)
Anesthesia H&P Update: History and Physical Exam reviewed; patient is OK for planned anesthetic and procedure. ? ?

## 2022-06-04 NOTE — Interval H&P Note (Signed)
History and Physical Interval Note:  06/04/2022 9:08 AM  Ariel Arnold  has presented today for surgery, with the diagnosis of Left foot infection.  The various methods of treatment have been discussed with the patient and family. After consideration of risks, benefits and other options for treatment, the patient has consented to  Procedure(s): IRRIGATION AND DEBRIDEMENT FOOT (Left) with revisional partial fifth ray amputation. As a surgical intervention.  The patient's history has been reviewed, patient examined, no change in status, stable for surgery.  I have reviewed the patient's chart and labs.  Questions were answered to the patient's satisfaction.     Candelaria Stagers

## 2022-06-04 NOTE — Progress Notes (Addendum)
PROGRESS NOTE    Ariel Arnold  GNF:[id number] DOB: January 12, 1956 DOA: 06/02/2022 PCP: Joaquin Courts, DO    Brief Narrative:  Ariel Arnold is a 66 y.o. female with medical history significant of HTN type II DM, PVD, [name] leading to liver cirrhosis, chronic pancytopenia, HLD, depression presented to the ER in [place] on 06/01/2022, with complaints of confusion and fever as well as foul-smelling discharge coming from left foot amputation site.  Found to have osteomyelitis and gram + bacteremia at OSH   Assessment and Plan:  Gram + bacteremia/left foot wound infection:  - IV Vanco and cefepime.   - MRI of the left foot - podiatry consult and plan for surgery on 6/17   NASH cirrhosis:  -Patient apparently had hepatic encephalopathy which has resolved.  Continue monitoring   essential hypertension:  -BP low so will hold home meds   type 2 diabetes:  -Hemoglobin A1c more than 11.   -Continue with sliding scale insulin -resume insulin   chronic kidney disease stage II:  -continue to monitor   mixed hyperlipidemia:  -resume home meds   GERD:  -Continue PPIs   DVT prophylaxis: enoxaparin (LOVENOX) injection 40 mg Start: 06/03/22 1000    Code Status: Full Code Family Communication: called daughter  Disposition Plan:  Level of care: Med-Surg Status is: Inpatient Remains inpatient appropriate because: needs surgery and IV abx    Consultants:  ID podiatry   Subjective: C/o itching where her purwick is  Objective: Vitals:   06/03/22 1839 06/03/22 2102 06/04/22 0453 06/04/22 0847  BP: (!) 121/48 (!) 112/53 (!) 96/41 (!) 113/50  Pulse: 73 77 72 67  Resp: 16 18 17    Temp: 98 F (36.7 C) 98.1 F (36.7 C) 98.1 F (36.7 C)   TempSrc: Oral Oral Oral   SpO2: 100% 100% 100%   Weight:      Height:        Intake/Output Summary (Last 24 hours) at 06/04/2022 0913 Last data filed at 06/04/2022 0801 Gross per 24 hour  Intake 1340 ml  Output 1000 ml  Net 340 ml    Filed Weights   06/02/22 2244  Weight: 85.6 kg    Examination:   General: Appearance:    Obese female in no acute distress     Lungs:     respirations unlabored  Heart:    Normal heart rate.     MS:   Left foot wrapped    Neurologic:   Awake, alert, pleasant and cooperative       Data Reviewed: I have personally reviewed following labs and imaging studies  CBC: Recent Labs  Lab 06/03/22 0012  WBC 2.3*  NEUTROABS 1.2*  HGB 7.9*  HCT 25.5*  MCV 104.5*  PLT 83*   Basic Metabolic Panel: Recent Labs  Lab 06/03/22 0012  NA 136  K 3.7  CL 108  CO2 20*  GLUCOSE 197*  BUN 24*  CREATININE 0.96  CALCIUM 7.9*   GFR: Estimated Creatinine Clearance: 62.7 mL/min (by C-G formula based on SCr of 0.96 mg/dL). Liver Function Tests: Recent Labs  Lab 06/03/22 0012  AST 34  ALT 26  ALKPHOS 96  BILITOT 0.8  PROT 6.0*  ALBUMIN 2.4*   No results for input(s): "LIPASE", "AMYLASE" in the last 168 hours. No results for input(s): "AMMONIA" in the last 168 hours. Coagulation Profile: No results for input(s): "INR", "PROTIME" in the last 168 hours. Cardiac Enzymes: No results for input(s): "CKTOTAL", "CKMB", "CKMBINDEX", "TROPONINI"  in the last 168 hours. BNP (last 3 results) No results for input(s): "PROBNP" in the last 8760 hours. HbA1C: No results for input(s): "HGBA1C" in the last 72 hours. CBG: Recent Labs  Lab 06/03/22 0815 06/03/22 1155 06/03/22 1826 06/03/22 2013 06/04/22 0730  GLUCAP 161* 198* 170* 154* 147*   Lipid Profile: No results for input(s): "CHOL", "HDL", "LDLCALC", "TRIG", "CHOLHDL", "LDLDIRECT" in the last 72 hours. Thyroid Function Tests: No results for input(s): "TSH", "T4TOTAL", "FREET4", "T3FREE", "THYROIDAB" in the last 72 hours. Anemia Panel: No results for input(s): "VITAMINB12", "FOLATE", "FERRITIN", "TIBC", "IRON", "RETICCTPCT" in the last 72 hours. Sepsis Labs: No results for input(s): "PROCALCITON", "LATICACIDVEN" in the last  168 hours.  Recent Results (from the past 240 hour(s))  MRSA Next Gen by PCR, Nasal     Status: Abnormal   Collection Time: 06/03/22  6:05 AM   Specimen: Nasal Mucosa; Nasal Swab  Result Value Ref Range Status   MRSA by PCR Next Gen DETECTED (A) NOT DETECTED Final    Comment: RESULT CALLED TO, READ BACK BY AND VERIFIED WITH: HALL, J. RN AT 0827 ON 06/03/2022 BY MECIAL J. (NOTE) The GeneXpert MRSA Assay (FDA approved for NASAL specimens only), is one component of a comprehensive MRSA colonization surveillance program. It is not intended to diagnose MRSA infection nor to guide or monitor treatment for MRSA infections. Test performance is not FDA approved in patients less than 31 years old. Performed at [facility], [address], [place], [place] [address]   Culture, blood (Routine X 2) w Reflex to ID Panel     Status: None (Preliminary result)   Collection Time: 06/03/22  8:58 AM   Specimen: BLOOD  Result Value Ref Range Status   Specimen Description   Final    BLOOD LEFT ANTECUBITAL Performed at [facility], [address], [place], [place] [address]    Special Requests   Final    IN PEDIATRIC BOTTLE Blood Culture adequate volume Performed at [facility], [address], [place], [place] [address]    Culture   Final    NO GROWTH < 24 HOURS Performed at [facility], [address], [place], [place] [address]    Report Status PENDING  Incomplete  Culture, blood (Routine X 2) w Reflex to ID Panel     Status: None (Preliminary result)   Collection Time: 06/03/22  8:58 AM   Specimen: BLOOD LEFT HAND  Result Value Ref Range Status   Specimen Description   Final    BLOOD LEFT HAND Performed at [facility], [address] [place], [place] [address]    Special Requests   Final    IN PEDIATRIC BOTTLE Blood Culture adequate volume Performed at [facility], [address], [place], [place] [address]    Culture   Final    NO GROWTH < 24 HOURS Performed at [facility], [address] [place], [place] [address]    Report Status PENDING  Incomplete         Radiology Studies: MR FOOT LEFT W WO CONTRAST  Result Date: 06/03/2022 CLINICAL DATA:  Osteomyelitis, foot Osteomyelitis left foot EXAM: MRI OF THE LEFT FOREFOOT WITHOUT AND WITH CONTRAST TECHNIQUE: Multiplanar, multisequence MR imaging of the left foot was performed both before and after administration of intravenous contrast. CONTRAST:  46mL GADAVIST GADOBUTROL 1 MMOL/ML IV SOLN COMPARISON:  X-ray 04/05/2022, MRI 04/03/2022 FINDINGS: Bones/Joint/Cartilage Prior transmetatarsal  amputation of the left foot. Abnormal bone marrow edema and confluent low T1 signal changes throughout the residual fifth metatarsal compatible with acute osteomyelitis. Additionally, there is bone marrow edema within the residual first through fourth metatarsals with slight irregularity along the resection margins suggestive of early acute osteomyelitis. No acute fracture or dislocation. Mild patchy marrow edema within the midfoot is less prominent compared to prior, and likely degenerative or stress related. No significant effusion. Ligaments Intact Lisfranc ligament. Muscles and Tendons Post amputation changes of the distal forefoot. Chronic denervation changes. Soft tissues Skin thickening and soft tissue swelling at the distal stump. Small rim enhancing abscess along the dorsal-medial margin of the residual fifth metatarsal diaphysis measuring 11 x 7 x 10 mm. Numerous foci of susceptibility artifact along the distal stump may be postsurgical. Soft tissue air not excluded. IMPRESSION: 1. Acute osteomyelitis throughout the residual fifth metatarsal. 2. Additional findings of early acute osteomyelitis involving the residual first through fourth metatarsals. 3. Small abscess adjacent to the residual fifth metatarsal measuring up to 1.1  cm. Electronically Signed   By: Davina Poke D.O.   On: 06/03/2022 17:01        Scheduled Meds:  acidophilus  1 capsule Oral Daily   diclofenac Sodium  2 g Topical QID   enoxaparin (LOVENOX) injection  40 mg Subcutaneous [id number]   folic acid  1 mg Oral BID   insulin aspart  0-20 Units Subcutaneous TID WC   insulin aspart  0-5 Units Subcutaneous QHS   insulin glargine-yfgn  15 Units Subcutaneous Daily   mupirocin ointment  1 Application Nasal BID   pravastatin  20 mg Oral QHS   sertraline  200 mg Oral Daily   Continuous Infusions:  ceFEPime (MAXIPIME) IV 2 g (06/03/22 2352)   vancomycin 1,000 mg (06/04/22 0156)     LOS: 2 days    Time spent: 35 minutes spent on chart review, discussion with nursing staff, consultants, updating family and interview/physical exam; more than 50% of that time was spent in counseling and/or coordination of care.    Geradine Girt, DO [facility] Available via Epic secure chat 7am-7pm After these hours, please refer to coverage provider listed on [email or web address] 06/04/2022, 9:13 AM

## 2022-06-04 NOTE — Op Note (Signed)
Surgeon: Surgeon(s): Candelaria Stagers, DPM  Assistants: None Pre-operative diagnosis: Left foot infection  Post-operative diagnosis: same Procedure: Procedure(s) (LRB): REVISIONAL TRAMSMETATARSAL AMPUTATION (Left)  Pathology:  ID Type Source Tests Collected by Time Destination  1 : LEFT FOOT BONE Tissue PATH Soft tissue resection SURGICAL PATHOLOGY Candelaria Stagers, DPM 06/04/2022 1004   A : LEFT FOOT BONE Wound Wound AEROBIC/ANAEROBIC CULTURE W GRAM STAIN (SURGICAL/DEEP WOUND) Candelaria Stagers, DPM 06/04/2022 0957     Pertinent Intra-op findings: Hard indurated bone noted.  No signs of osteomyelitis appreciated the fifth metatarsal at the remaining level.  No abscess was clinically seen. Anesthesia: General  Hemostasis: * Missing tourniquet times found for documented tourniquets in log: [id number] * EBL: 150 mL  Materials: 3-0 Prolene and skin staple with Cytal powder Injectables: 10 cc of 2% lidocaine plain half percent Marcaine plain Complications: None  Indications for surgery: A 66 y.o. female presents with left foot osteomyelitis of a previous transmetatarsal amputation. Patient has failed all conservative therapy including but not limited to local wound care and IV antibiotics. She wishes to have surgical correction of the foot/deformity. It was determined that patient would benefit from left revisional transmetatarsal amputation with primary closure    Informed surgical risk consent was reviewed and read aloud to the patient.  I reviewed the films.  I have discussed my findings with the patient in great detail.  I have discussed all risks including but not limited to infection, stiffness, scarring, limp, disability, deformity, damage to blood vessels and nerves, numbness, poor healing, need for braces, arthritis, chronic pain, amputation, death.  All benefits and realistic expectations discussed in great detail.  I have made no promises as to the outcome.  I have provided realistic expectations.   I have offered the patient a 2nd opinion, which they have declined and assured me they preferred to proceed despite the risks   Procedure in detail: The patient was both verbally and visually identified by myself, the nursing staff, and anesthesia staff in the preoperative holding area. They were then transferred to the operating room and placed on the operative table in supine position.  Attention was directed to the previous transmetatarsal amputation wound.  An incision was made along the lateral aspect of the fifth metatarsal.  Fifth metatarsal was identified and excised out partially up to the level of the bone infection.  No further bone infection was clinically appreciated.  The remaining bone was hard and indurated.  Granulation tissue was excised out in order to prepare for closure of the transmetatarsal.  No signs of infection or abscess noted.  No purulent drainage was noted.  The wound was thoroughly irrigated with normal saline solution. Cytal powder was used in the amputation site to assist for healing the transmetatarsal at the site was primarily closed with 3-0 Prolene and skin staple.  The wound was dressed with Xeroform 4 x 4 gauze Kerlix Ace bandage. -All bony prominences were adequately padded  Disposition Patient is okay to be discharged from my standpoint.  No dressing change until follow-up.  The wound is primarily closed.  P.o. antibiotics doxycycline for 14 days.  We will continue following the OR culture.  I will narrow based on that.  Physical therapy for nonweightbearing to the left lower extremity.  At the conclusion of the procedure the patient was awoken from anesthesia and found to have tolerated the procedure well any complications. There were transferred to PACU with vital signs stable and vascular status intact.  Nicholes Rough, DPM

## 2022-06-04 NOTE — Plan of Care (Signed)
  Problem: Clinical Measurements: Goal: Ability to maintain clinical measurements within normal limits will improve Outcome: Progressing   Problem: Activity: Goal: Risk for activity intolerance will decrease Outcome: Progressing   Problem: Coping: Goal: Level of anxiety will decrease Outcome: Progressing   

## 2022-06-04 NOTE — Transfer of Care (Signed)
Immediate Anesthesia Transfer of Care Note  Patient: Ariel Arnold  Procedure(s) Performed: REVISIONAL TRAMSMETATARSAL AMPUTATION (Left: Foot)  Patient Location: PACU  Anesthesia Type:General  Level of Consciousness: awake, alert , oriented and patient cooperative  Airway & Oxygen Therapy: Patient Spontanous Breathing  Post-op Assessment: Report given to RN and Post -op Vital signs reviewed and stable  Post vital signs: Reviewed and stable  Last Vitals:  Vitals Value Taken Time  BP    Temp    Pulse 66 06/04/22 1026  Resp 10 06/04/22 1026  SpO2 100 % 06/04/22 1026  Vitals shown include unvalidated device data.  Last Pain:  Vitals:   06/04/22 0453  TempSrc: Oral  PainSc:          Complications: No notable events documented.

## 2022-06-04 NOTE — Anesthesia Postprocedure Evaluation (Signed)
Anesthesia Post Note  Patient: Ariel Arnold  Procedure(s) Performed: REVISIONAL TRAMSMETATARSAL AMPUTATION (Left: Foot)     Patient location during evaluation: PACU Anesthesia Type: General Level of consciousness: awake and alert Pain management: pain level controlled Vital Signs Assessment: post-procedure vital signs reviewed and stable Respiratory status: spontaneous breathing, nonlabored ventilation, respiratory function stable and patient connected to nasal cannula oxygen Cardiovascular status: blood pressure returned to baseline and stable Postop Assessment: no apparent nausea or vomiting Anesthetic complications: no   No notable events documented.  Last Vitals:  Vitals:   06/04/22 0847 06/04/22 1025  BP: (!) 113/50 (!) 121/58  Pulse: 67 70  Resp:  11  Temp:  36.4 C  SpO2:  100%    Last Pain:  Vitals:   06/04/22 1025  TempSrc:   PainSc: 0-No pain    LLE Motor Response: Responds to commands;Purposeful movement (06/04/22 1025) LLE Sensation: Full sensation (06/04/22 1025)          Json Koelzer L Fransisca Shawn

## 2022-06-04 NOTE — Anesthesia Procedure Notes (Signed)
Procedure Name: LMA Insertion Date/Time: 06/04/2022 9:34 AM  Performed by: Ponciano Ort, CRNAPre-anesthesia Checklist: Patient identified, Emergency Drugs available, Suction available and Patient being monitored Patient Re-evaluated:Patient Re-evaluated prior to induction Oxygen Delivery Method: Circle system utilized Preoxygenation: Pre-oxygenation with 100% oxygen Induction Type: IV induction LMA: LMA inserted LMA Size: 4.0 Number of attempts: 1 Airway Equipment and Method: Stylet and Oral airway Placement Confirmation: positive ETCO2 and breath sounds checked- equal and bilateral Tube secured with: Tape Comments: A tiny spot on the front tongue.

## 2022-06-05 DIAGNOSIS — L03116 Cellulitis of left lower limb: Secondary | ICD-10-CM | POA: Diagnosis not present

## 2022-06-05 LAB — BASIC METABOLIC PANEL
Anion gap: 4 — ABNORMAL LOW (ref 5–15)
BUN: 20 mg/dL (ref 8–23)
CO2: 20 mmol/L — ABNORMAL LOW (ref 22–32)
Calcium: 8 mg/dL — ABNORMAL LOW (ref 8.9–10.3)
Chloride: 111 mmol/L (ref 98–111)
Creatinine, Ser: 0.84 mg/dL (ref 0.44–1.00)
GFR, Estimated: >60 mL/min (ref >60)
Glucose, Bld: 258 mg/dL — ABNORMAL HIGH (ref 70–99)
Potassium: 3.9 mmol/L (ref 3.5–5.1)
Sodium: 135 mmol/L (ref 135–145)

## 2022-06-05 LAB — CBC
HCT: 24.9 % — ABNORMAL LOW (ref 36.0–46.0)
Hemoglobin: 7.7 g/dL — ABNORMAL LOW (ref 12.0–15.0)
MCH: 32.6 pg (ref 26.0–34.0)
MCHC: 30.9 g/dL (ref 30.0–36.0)
MCV: 105.5 fL — ABNORMAL HIGH (ref 80.0–100.0)
Platelets: 75 10*3/uL — ABNORMAL LOW (ref 150–400)
RBC: 2.36 MIL/uL — ABNORMAL LOW (ref 3.87–5.11)
RDW: 17.2 % — ABNORMAL HIGH (ref 11.5–15.5)
WBC: 2.6 10*3/uL — ABNORMAL LOW (ref 4.0–10.5)
nRBC: 0 % (ref 0.0–0.2)

## 2022-06-05 LAB — GLUCOSE, CAPILLARY
Glucose-Capillary: 216 mg/dL — ABNORMAL HIGH (ref 70–99)
Glucose-Capillary: 166 mg/dL — ABNORMAL HIGH (ref 70–99)
Glucose-Capillary: 106 mg/dL — ABNORMAL HIGH (ref 70–99)
Glucose-Capillary: 134 mg/dL — ABNORMAL HIGH (ref 70–99)

## 2022-06-05 MED ORDER — INSULIN GLARGINE-YFGN 100 UNIT/ML ~~LOC~~ SOLN
40.0000 [IU] | Freq: Every day | SUBCUTANEOUS | Status: DC
  Administered 2022-06-06 – 2022-06-08 (×3): 40 [IU] via SUBCUTANEOUS
  Filled 2022-06-05 (×3): qty 0.4

## 2022-06-05 MED ORDER — DIPHENHYDRAMINE HCL 25 MG PO CAPS
25.0000 mg | ORAL_CAPSULE | Freq: Four times a day (QID) | ORAL | Status: DC | PRN
  Administered 2022-06-05: 25 mg via ORAL
  Filled 2022-06-05: qty 1

## 2022-06-05 MED ORDER — INSULIN GLARGINE-YFGN 100 UNIT/ML ~~LOC~~ SOLN
25.0000 [IU] | Freq: Once | SUBCUTANEOUS | Status: AC
  Administered 2022-06-05: 25 [IU] via SUBCUTANEOUS
  Filled 2022-06-05: qty 0.25

## 2022-06-05 MED ORDER — CAMPHOR-MENTHOL 0.5-0.5 % EX LOTN
TOPICAL_LOTION | CUTANEOUS | Status: DC | PRN
  Filled 2022-06-05: qty 222

## 2022-06-05 MED ORDER — INSULIN ASPART 100 UNIT/ML IJ SOLN
5.0000 [IU] | Freq: Three times a day (TID) | INTRAMUSCULAR | Status: DC
Start: 2022-06-05 — End: 2022-06-08
  Administered 2022-06-05 – 2022-06-08 (×9): 5 [IU] via SUBCUTANEOUS

## 2022-06-05 MED ORDER — SODIUM CHLORIDE 0.9 % IV SOLN: INTRAVENOUS | Status: AC

## 2022-06-05 NOTE — NC FL2 (Signed)
[place] MEDICAID FL2 LEVEL OF CARE SCREENING TOOL     IDENTIFICATION  Patient Name: Ariel Arnold Birthdate: 1956-02-01 Sex: female Admission Date (Current Location): 06/02/2022  [facility] and [place] Number:  Producer, television/film/video and Address:  [facility] [address],  [address] [place] [address], [place] [address]      Provider Number: [id number]  Attending Physician Name and Address:  Joseph Art, DO  Relative Name and Phone Number:  [name] Daughter   [phone]  Jari Favre   [phone]    Current Level of Care: Hospital Recommended Level of Care: Skilled Nursing Facility Prior Approval Number:    Date Approved/Denied:   PASRR Number:    Discharge Plan: SNF    Current Diagnoses: Patient Active Problem List   Diagnosis Date Noted   Cellulitis of left foot 06/02/2022   Ascites 04/05/2022   Cellulitis 04/02/2022   Lactic acidosis 04/02/2022   Chronic osteomyelitis involving ankle and foot, left (HCC) 11/09/2021   Essential hypertension 11/09/2021   Hematemesis    Liver cirrhosis secondary to NASH (HCC) 05/19/2021   Thrombocytopenia (HCC) 05/19/2021   Obesity (BMI 30-39.9) 02/15/2021   Acquired absence of left foot (HCC) 01/11/2021   Non-healing wound of amputation stump (HCC) 09/09/2020   Coronary atherosclerosis 08/04/2020   Non-prs chronic ulcer oth prt right foot w fat layer exposed (HCC) 12/24/2019   Anemia in chronic kidney disease 12/20/2019   Acquired absence of other left toe(s) (HCC) 11/19/2019   Stage 2 chronic kidney disease due to type 2 diabetes mellitus (HCC) 08/01/2019   Nonalcoholic fatty liver disease 05/07/2019   Type 2 diabetes mellitus treated with insulin (HCC) 05/06/2019   Depressive disorder 04/18/2019   Generalized anxiety disorder 04/18/2019   Seasonal allergic rhinitis 04/18/2019   Arthritis 04/18/2019   Mixed hyperlipidemia 12/19/2018    Orientation RESPIRATION BLADDER Height & Weight     Self, Time, Situation,  Place  Normal Continent Weight: 188 lb 11.4 oz (85.6 kg) Height:  5\' 6"  (167.6 cm)  BEHAVIORAL SYMPTOMS/MOOD NEUROLOGICAL BOWEL NUTRITION STATUS      Continent Diet  AMBULATORY STATUS COMMUNICATION OF NEEDS Skin   Limited Assist Verbally Surgical wounds                       Personal Care Assistance Level of Assistance              Functional Limitations Info  Sight, Speech Sight Info: Adequate   Speech Info: Adequate    SPECIAL CARE FACTORS FREQUENCY  PT (By licensed PT), OT (By licensed OT)     PT Frequency: Minimum 5x a week OT Frequency: Minimum 5x a week            Contractures Contractures Info: Not present    Additional Factors Info  Code Status, Allergies, Insulin Sliding Scale, Psychotropic Code Status Info: Full Code Allergies Info: Chlorhexidine   Tape Psychotropic Info: sertraline (ZOLOFT) tablet 200 mg Insulin Sliding Scale Info: insulin aspart (novoLOG) injection 0-20 Units 3x a day with meals       Current Medications (06/05/2022):  This is the current hospital active medication list Current Facility-Administered Medications  Medication Dose Route Frequency Provider Last Rate Last Admin   0.9 %  sodium chloride infusion   Intravenous Continuous 06/07/2022, DO 75 mL/hr at 06/05/22 0923 New Bag at 06/05/22 0923   acidophilus (RISAQUAD) capsule 1 capsule  1 capsule Oral Daily 06/07/22, DPM   1 capsule  at 06/05/22 0905   albuterol (PROVENTIL) (2.5 MG/3ML) 0.083% nebulizer solution 2.5 mg  2.5 mg Nebulization Q4H PRN Candelaria Stagers, DPM       camphor-menthol (SARNA) lotion   Topical PRN Marlin Canary U, DO       ceFEPIme (MAXIPIME) 2 g in sodium chloride 0.9 % 100 mL IVPB  2 g Intravenous Q8H Candelaria Stagers, DPM 200 mL/hr at 06/05/22 0926 2 g at 06/05/22 7124   diclofenac Sodium (VOLTAREN) 1 % topical gel 2 g  2 g Topical QID Candelaria Stagers, DPM   2 g at 06/05/22 1415   diphenhydrAMINE (BENADRYL) capsule 25 mg  25 mg Oral Q6H PRN Jimmye Norman, NP   25 mg at 06/05/22 0150   enoxaparin (LOVENOX) injection 40 mg  40 mg Subcutaneous Q24H Candelaria Stagers, DPM   40 mg at 06/05/22 5809   folic acid (FOLVITE) tablet 1 mg  1 mg Oral BID Candelaria Stagers, DPM   1 mg at 06/05/22 9833   gabapentin (NEURONTIN) capsule 600 mg  600 mg Oral TID PRN Candelaria Stagers, DPM   600 mg at 06/04/22 2231   insulin aspart (novoLOG) injection 0-20 Units  0-20 Units Subcutaneous TID WC Candelaria Stagers, DPM   4 Units at 06/05/22 1158   insulin aspart (novoLOG) injection 0-5 Units  0-5 Units Subcutaneous QHS Candelaria Stagers, DPM   2 Units at 06/04/22 2224   insulin aspart (novoLOG) injection 5 Units  5 Units Subcutaneous TID WC Vann, Jessica U, DO   5 Units at 06/05/22 1158   [START ON 06/06/2022] insulin glargine-yfgn (SEMGLEE) injection 40 Units  40 Units Subcutaneous Daily Vann, Jessica U, DO       ketorolac (ACULAR) 0.5 % ophthalmic solution 1 drop  1 drop Both Eyes QID PRN Candelaria Stagers, DPM       morphine (PF) 2 MG/ML injection 2 mg  2 mg Intravenous Q2H PRN Candelaria Stagers, DPM   2 mg at 06/04/22 1300   mupirocin ointment (BACTROBAN) 2 % 1 Application  1 Application Nasal BID Candelaria Stagers, DPM   1 Application at 06/05/22 0909   ondansetron (ZOFRAN) tablet 4 mg  4 mg Oral Q6H PRN Candelaria Stagers, DPM       Or   ondansetron (ZOFRAN) injection 4 mg  4 mg Intravenous Q6H PRN Candelaria Stagers, DPM       pravastatin (PRAVACHOL) tablet 20 mg  20 mg Oral QHS Candelaria Stagers, DPM   20 mg at 06/04/22 2210   sertraline (ZOLOFT) tablet 200 mg  200 mg Oral Daily Candelaria Stagers, DPM   200 mg at 06/05/22 0905   vancomycin (VANCOCIN) IVPB 1000 mg/200 mL premix  1,000 mg Intravenous Q24H Candelaria Stagers, DPM 200 mL/hr at 06/05/22 0150 1,000 mg at 06/05/22 0150     Discharge Medications: Please see discharge summary for a list of discharge medications.  Relevant Imaging Results:  Relevant Lab Results:   Additional Information SSN [id number]  Darleene Cleaver, LCSW

## 2022-06-05 NOTE — Progress Notes (Signed)
PROGRESS NOTE    Ariel Arnold  QPY:[id number] DOB: [birth year] DOA: 06/02/2022 PCP: Joaquin Courts, DO    Brief Narrative:  Ariel Arnold is a 66 y.o. female with medical history significant of HTN type II DM, PVD, [name] leading to liver cirrhosis, chronic pancytopenia, HLD, depression presented to the ER in [place] on 06/01/2022, with complaints of confusion and fever as well as foul-smelling discharge coming from left foot amputation site.  Found to have osteomyelitis and gram + bacteremia at OSH (suspect blood culture may be contaminant)   Assessment and Plan:  Gram + bacteremia/left foot wound infection:  - IV Vanco and cefepime.   - MRI of the left foot- with osteo - podiatry consult: s/p surgery on 6/17   NASH cirrhosis:  -Patient apparently had hepatic encephalopathy which has resolved.  Continue monitoring   essential hypertension:  -BP low so will hold home meds   type 2 diabetes:  -Hemoglobin A1c more than 11.   -Continue with sliding scale insulin -resume insulin and increase   chronic kidney disease stage II:  -continue to monitor   mixed hyperlipidemia:  -resume home meds   GERD:  -Continue PPIs  Anemia -unclear etiology -defer to outpatient for work up -transfuse for <7 MCV >100  DVT prophylaxis: enoxaparin (LOVENOX) injection 40 mg Start: 06/03/22 1000    Code Status: Full Code Family Communication: called daughter 6/17  Disposition Plan:  Level of care: Med-Surg Status is: Inpatient Remains inpatient appropriate because: await blood cultures    Consultants:  ID podiatry   Subjective: Had some itching last PM  Objective: Vitals:   06/04/22 2212 06/05/22 0208 06/05/22 0638 06/05/22 1041  BP: (!) 128/55 (!) 127/56 96/64 114/85  Pulse: 84 89 78 82  Resp: 14 16 16 14   Temp: 98.2 F (36.8 C) 98.5 F (36.9 C) 97.9 F (36.6 C) 98.4 F (36.9 C)  TempSrc: Oral Oral Axillary Oral  SpO2: 100% 99% 97% 100%  Weight:      Height:         Intake/Output Summary (Last 24 hours) at 06/05/2022 1301 Last data filed at 06/05/2022 06/07/2022 Gross per 24 hour  Intake 0 ml  Output 1700 ml  Net -1700 ml   Filed Weights   06/02/22 2244  Weight: 85.6 kg    Examination:   General: Appearance:    Obese female in no acute distress     Lungs:     respirations unlabored  Heart:    Normal heart rate.     MS:   Left foot wrapped    Neurologic:   Awake, alert, pleasant and cooperative       Data Reviewed: I have personally reviewed following labs and imaging studies  CBC: Recent Labs  Lab 06/03/22 0012 06/05/22 0313  WBC 2.3* 2.6*  NEUTROABS 1.2*  --   HGB 7.9* 7.7*  HCT 25.5* 24.9*  MCV 104.5* 105.5*  PLT 83* 75*   Basic Metabolic Panel: Recent Labs  Lab 06/03/22 0012 06/05/22 0313  NA 136 135  K 3.7 3.9  CL 108 111  CO2 20* 20*  GLUCOSE 197* 258*  BUN 24* 20  CREATININE 0.96 0.84  CALCIUM 7.9* 8.0*   GFR: Estimated Creatinine Clearance: 71.6 mL/min (by C-G formula based on SCr of 0.84 mg/dL). Liver Function Tests: Recent Labs  Lab 06/03/22 0012  AST 34  ALT 26  ALKPHOS 96  BILITOT 0.8  PROT 6.0*  ALBUMIN 2.4*   No results for  input(s): "LIPASE", "AMYLASE" in the last 168 hours. No results for input(s): "AMMONIA" in the last 168 hours. Coagulation Profile: No results for input(s): "INR", "PROTIME" in the last 168 hours. Cardiac Enzymes: No results for input(s): "CKTOTAL", "CKMB", "CKMBINDEX", "TROPONINI" in the last 168 hours. BNP (last 3 results) No results for input(s): "PROBNP" in the last 8760 hours. HbA1C: No results for input(s): "HGBA1C" in the last 72 hours. CBG: Recent Labs  Lab 06/04/22 1249 06/04/22 1647 06/04/22 2210 06/05/22 0742 06/05/22 1105  GLUCAP 149* 193* 209* 216* 166*   Lipid Profile: No results for input(s): "CHOL", "HDL", "LDLCALC", "TRIG", "CHOLHDL", "LDLDIRECT" in the last 72 hours. Thyroid Function Tests: No results for input(s): "TSH", "T4TOTAL",  "FREET4", "T3FREE", "THYROIDAB" in the last 72 hours. Anemia Panel: No results for input(s): "VITAMINB12", "FOLATE", "FERRITIN", "TIBC", "IRON", "RETICCTPCT" in the last 72 hours. Sepsis Labs: No results for input(s): "PROCALCITON", "LATICACIDVEN" in the last 168 hours.  Recent Results (from the past 240 hour(s))  MRSA Next Gen by PCR, Nasal     Status: Abnormal   Collection Time: 06/03/22  6:05 AM   Specimen: Nasal Mucosa; Nasal Swab  Result Value Ref Range Status   MRSA by PCR Next Gen DETECTED (A) NOT DETECTED Final    Comment: RESULT CALLED TO, READ BACK BY AND VERIFIED WITH: HALL, J. RN AT 0827 ON 06/03/2022 BY MECIAL J. (NOTE) The GeneXpert MRSA Assay (FDA approved for NASAL specimens only), is one component of a comprehensive MRSA colonization surveillance program. It is not intended to diagnose MRSA infection nor to guide or monitor treatment for MRSA infections. Test performance is not FDA approved in patients less than 41 years old. Performed at [facility], [address] [place], [place] [address]   Culture, blood (Routine X 2) w Reflex to ID Panel     Status: None (Preliminary result)   Collection Time: 06/03/22  8:58 AM   Specimen: BLOOD  Result Value Ref Range Status   Specimen Description   Final    BLOOD LEFT ANTECUBITAL Performed at [facility], [address], [place], [place] [address]    Special Requests   Final    IN PEDIATRIC BOTTLE Blood Culture adequate volume Performed at [facility], [address], [place], [place] [address]    Culture   Final    NO GROWTH 2 DAYS Performed at [facility], [address], [place], [place] [address]    Report Status PENDING  Incomplete  Culture, blood (Routine X 2) w Reflex to ID Panel     Status: None (Preliminary result)   Collection Time: 06/03/22  8:58 AM   Specimen: BLOOD LEFT HAND  Result Value Ref Range Status   Specimen Description   Final     BLOOD LEFT HAND Performed at [facility], [address] [place], [place] [address]    Special Requests   Final    IN PEDIATRIC BOTTLE Blood Culture adequate volume Performed at [facility], [address], [place], [place] [address]    Culture   Final    NO GROWTH 2 DAYS Performed at [facility], [address], [place], [place] [address]    Report Status PENDING  Incomplete  Aerobic/Anaerobic Culture w Gram Stain (surgical/deep wound)     Status: None (Preliminary result)   Collection Time: 06/04/22  9:57 AM   Specimen: Wound  Result Value Ref Range Status   Specimen  Description   Final    WOUND BONE Performed at [facility], [place] [address], [place], [place] [address]    Special Requests   Final    LACTOSE FERMENTER Performed at [facility] [address], [place], [place] [address]    Gram Stain   Final    NO SQUAMOUS EPITHELIAL CELLS SEEN RARE WBC SEEN NO ORGANISMS SEEN    Culture   Final    RARE STAPHYLOCOCCUS AUREUS CULTURE REINCUBATED FOR BETTER GROWTH Performed at [facility], [place] [address] [place], [place] [address]    Report Status PENDING  Incomplete         Radiology Studies: DG Foot Complete Left  Result Date: 06/04/2022 CLINICAL DATA:  Transmetatarsal amputation revision today. EXAM: LEFT FOOT - COMPLETE 3+ VIEW COMPARISON:  04/05/2022 FINDINGS: Examination demonstrates evidence of patient's recent transmetatarsal amputation of toes 1 through 5. Evidence of interval revision of the fifth transmetatarsal amputation site. Skin staples over the distal soft tissues. Exam is otherwise unchanged. IMPRESSION: Evidence of patient's recent transmetatarsal amputation of toes 1 through 5 with interval revision of the fifth transmetatarsal amputation site. Electronically Signed   By: Marin Olp M.D.   On: 06/04/2022 10:57   MR FOOT LEFT W WO CONTRAST  Result Date:  06/03/2022 CLINICAL DATA:  Osteomyelitis, foot Osteomyelitis left foot EXAM: MRI OF THE LEFT FOREFOOT WITHOUT AND WITH CONTRAST TECHNIQUE: Multiplanar, multisequence MR imaging of the left foot was performed both before and after administration of intravenous contrast. CONTRAST:  46mL GADAVIST GADOBUTROL 1 MMOL/ML IV SOLN COMPARISON:  X-ray 04/05/2022, MRI 04/03/2022 FINDINGS: Bones/Joint/Cartilage Prior transmetatarsal amputation of the left foot. Abnormal bone marrow edema and confluent low T1 signal changes throughout the residual fifth metatarsal compatible with acute osteomyelitis. Additionally, there is bone marrow edema within the residual first through fourth metatarsals with slight irregularity along the resection margins suggestive of early acute osteomyelitis. No acute fracture or dislocation. Mild patchy marrow edema within the midfoot is less prominent compared to prior, and likely degenerative or stress related. No significant effusion. Ligaments Intact Lisfranc ligament. Muscles and Tendons Post amputation changes of the distal forefoot. Chronic denervation changes. Soft tissues Skin thickening and soft tissue swelling at the distal stump. Small rim enhancing abscess along the dorsal-medial margin of the residual fifth metatarsal diaphysis measuring 11 x 7 x 10 mm. Numerous foci of susceptibility artifact along the distal stump may be postsurgical. Soft tissue air not excluded. IMPRESSION: 1. Acute osteomyelitis throughout the residual fifth metatarsal. 2. Additional findings of early acute osteomyelitis involving the residual first through fourth metatarsals. 3. Small abscess adjacent to the residual fifth metatarsal measuring up to 1.1 cm. Electronically Signed   By: Davina Poke D.O.   On: 06/03/2022 17:01        Scheduled Meds:  acidophilus  1 capsule Oral Daily   diclofenac Sodium  2 g Topical QID   enoxaparin (LOVENOX) injection  40 mg Subcutaneous [id number]   folic acid  1 mg Oral BID    insulin aspart  0-20 Units Subcutaneous TID WC   insulin aspart  0-5 Units Subcutaneous QHS   insulin aspart  5 Units Subcutaneous TID WC   [START ON 06/06/2022] insulin glargine-yfgn  40 Units Subcutaneous Daily   mupirocin ointment  1 Application Nasal BID   pravastatin  20 mg Oral QHS   sertraline  200 mg Oral Daily   Continuous Infusions:  sodium chloride 75 mL/hr at 06/05/22 [phone]  ceFEPime (MAXIPIME) IV 2 g (06/05/22 0926)   vancomycin 1,000 mg (06/05/22 0150)     LOS: 3 days    Time spent: 35 minutes spent on chart review, discussion with nursing staff, consultants, updating family and interview/physical exam; more than 50% of that time was spent in counseling and/or coordination of care.    Geradine Girt, DO [facility] Available via Epic secure chat 7am-7pm After these hours, please refer to coverage provider listed on [email or web address] 06/05/2022, 1:01 PM

## 2022-06-05 NOTE — Progress Notes (Signed)
The patient is requesting Benadryl for itching. Paged [facility].

## 2022-06-05 NOTE — TOC Progression Note (Signed)
Transition of Care [facility]) - Progression Note    Patient Details  Name: Ariel Arnold MRN: [id number] Date of Birth: Aug 31, 1956  Transition of Care [facility]) CM/SW Contact  Darleene Cleaver, [place] Phone Number: 06/05/2022, 4:40 PM  Clinical Narrative:     Patient is from [facility], will need updated insurance auth.  CSW sent clinicals to SNF for review.    Expected Discharge Plan: Skilled Nursing Facility Barriers to Discharge: Continued Medical Work up  Expected Discharge Plan and Services Expected Discharge Plan: Skilled Nursing Facility In-house Referral: Clinical Social Work Discharge Planning Services: NA Post Acute Care Choice: Skilled Nursing Facility Living arrangements for the past 2 months: Skilled Nursing Facility                 DME Arranged: N/A DME Agency: NA                   Social Determinants of Health (SDOH) Interventions    Readmission Risk Interventions    11/16/2021    9:27 AM  Readmission Risk Prevention Plan  Home Care Screening Complete  Medication Review (RN CM) Complete

## 2022-06-05 NOTE — TOC Initial Note (Signed)
Transition of Care [facility]) - Initial/Assessment Note    Patient Details  Name: Ariel Arnold MRN: [id number] Date of Birth: 1956/11/21  Transition of Care [facility]) CM/SW Contact:    Darleene Cleaver, LCSW Phone Number: 06/05/2022, 4:09 PM  Clinical Narrative:                  Patient is from [facility] for short term rehab.  Patient and her family want her to return to continue with her short term rehab.  Expected Discharge Plan: Skilled Nursing Facility Barriers to Discharge: Continued Medical Work up   Patient Goals and CMS Choice Patient states their goals for this hospitalization and ongoing recovery are:: To return back home. CMS Medicare.gov Compare Post Acute Care list provided to:: Patient Represenative (must comment) Choice offered to / list presented to : Adult Children  Expected Discharge Plan and Services Expected Discharge Plan: Skilled Nursing Facility In-house Referral: Clinical Social Work Discharge Planning Services: NA Post Acute Care Choice: Skilled Nursing Facility Living arrangements for the past 2 months: Skilled Nursing Facility                 DME Arranged: N/A DME Agency: NA                  Prior Living Arrangements/Services Living arrangements for the past 2 months: Skilled Nursing Facility Lives with:: Facility Resident Patient language and need for interpreter reviewed:: Yes Do you feel safe going back to the place where you live?: Yes      Need for Family Participation in Patient Care: Yes (Comment) Care giver support system in place?: Yes (comment)   Criminal Activity/Legal Involvement Pertinent to Current Situation/Hospitalization: No - Comment as needed  Activities of Daily Living Home Assistive Devices/Equipment: Walker (specify type) (front wheel walker) ADL Screening (condition at time of admission) Patient's cognitive ability adequate to safely complete daily activities?: No Is the patient deaf or have difficulty hearing?:  No Does the patient have difficulty seeing, even when wearing glasses/contacts?: No Does the patient have difficulty concentrating, remembering, or making decisions?: Yes Patient able to express need for assistance with ADLs?: Yes Does the patient have difficulty dressing or bathing?: Yes Independently performs ADLs?: No Communication: Independent Dressing (OT): Needs assistance Is this a change from baseline?: Pre-admission baseline Grooming: Independent Feeding: Independent Bathing: Needs assistance Is this a change from baseline?: Pre-admission baseline Toileting: Needs assistance Is this a change from baseline?: Pre-admission baseline In/Out Bed: Needs assistance Is this a change from baseline?: Pre-admission baseline Walks in Home: Needs assistance Is this a change from baseline?: Pre-admission baseline Does the patient have difficulty walking or climbing stairs?: Yes Weakness of Legs: Left Weakness of Arms/Hands: None  Permission Sought/Granted Permission sought to share information with : Family Supports, Case Manager Permission granted to share information with : Yes, Release of Information Signed  Share Information with NAME: [name] Daughter   [phone]  Jari Favre   [phone]  Permission granted to share info w AGENCY: SNF admissions        Emotional Assessment Appearance:: Appears stated age Attitude/Demeanor/Rapport: Engaged Affect (typically observed): Stable, Pleasant, Calm Orientation: : Oriented to Self, Oriented to Place, Oriented to  Time, Oriented to Situation Alcohol / Substance Use: Not Applicable Psych Involvement: No (comment)  Admission diagnosis:  Cellulitis of left foot [L03.116] Patient Active Problem List   Diagnosis Date Noted   Cellulitis of left foot 06/02/2022   Ascites 04/05/2022   Cellulitis 04/02/2022   Lactic  acidosis 04/02/2022   Chronic osteomyelitis involving ankle and foot, left (HCC) 11/09/2021    Essential hypertension 11/09/2021   Hematemesis    Liver cirrhosis secondary to NASH (HCC) 05/19/2021   Thrombocytopenia (HCC) 05/19/2021   Obesity (BMI 30-39.9) 02/15/2021   Acquired absence of left foot (HCC) 01/11/2021   Non-healing wound of amputation stump (HCC) 09/09/2020   Coronary atherosclerosis 08/04/2020   Non-prs chronic ulcer oth prt right foot w fat layer exposed (HCC) 12/24/2019   Anemia in chronic kidney disease 12/20/2019   Acquired absence of other left toe(s) (HCC) 11/19/2019   Stage 2 chronic kidney disease due to type 2 diabetes mellitus (HCC) 08/01/2019   Nonalcoholic fatty liver disease 05/07/2019   Type 2 diabetes mellitus treated with insulin (HCC) 05/06/2019   Depressive disorder 04/18/2019   Generalized anxiety disorder 04/18/2019   Seasonal allergic rhinitis 04/18/2019   Arthritis 04/18/2019   Mixed hyperlipidemia 12/19/2018   PCP:  Joaquin Courts, DO Pharmacy:   [facility] [phone] - [place], [place] - [address] [place] [address] Phone: [phone] Fax: [phone]     Social Determinants of Health (SDOH) Interventions    Readmission Risk Interventions    11/16/2021    9:27 AM  Readmission Risk Prevention Plan  Home Care Screening Complete  Medication Review (RN CM) Complete

## 2022-06-05 NOTE — Plan of Care (Signed)
  Problem: Education: Goal: Knowledge of General Education information will improve Description: Including pain rating scale, medication(s)/side effects and non-pharmacologic comfort measures Outcome: Progressing   Problem: Activity: Goal: Risk for activity intolerance will decrease Outcome: Progressing   Problem: Coping: Goal: Level of anxiety will decrease Outcome: Progressing   

## 2022-06-06 ENCOUNTER — Other Ambulatory Visit (HOSPITAL_COMMUNITY): Payer: Medicare Other

## 2022-06-06 ENCOUNTER — Encounter (HOSPITAL_COMMUNITY): Payer: Self-pay | Admitting: Podiatry

## 2022-06-06 DIAGNOSIS — E119 Type 2 diabetes mellitus without complications: Secondary | ICD-10-CM

## 2022-06-06 DIAGNOSIS — I8511 Secondary esophageal varices with bleeding: Secondary | ICD-10-CM

## 2022-06-06 DIAGNOSIS — B9562 Methicillin resistant Staphylococcus aureus infection as the cause of diseases classified elsewhere: Secondary | ICD-10-CM

## 2022-06-06 DIAGNOSIS — Z794 Long term (current) use of insulin: Secondary | ICD-10-CM

## 2022-06-06 DIAGNOSIS — F32A Depression, unspecified: Secondary | ICD-10-CM

## 2022-06-06 DIAGNOSIS — R7881 Bacteremia: Secondary | ICD-10-CM

## 2022-06-06 DIAGNOSIS — Z89432 Acquired absence of left foot: Secondary | ICD-10-CM | POA: Diagnosis not present

## 2022-06-06 DIAGNOSIS — K7581 Nonalcoholic steatohepatitis (NASH): Secondary | ICD-10-CM

## 2022-06-06 DIAGNOSIS — K7460 Unspecified cirrhosis of liver: Secondary | ICD-10-CM

## 2022-06-06 DIAGNOSIS — E782 Mixed hyperlipidemia: Secondary | ICD-10-CM

## 2022-06-06 LAB — BASIC METABOLIC PANEL
Anion gap: 4 — ABNORMAL LOW (ref 5–15)
BUN: 23 mg/dL (ref 8–23)
CO2: 22 mmol/L (ref 22–32)
Calcium: 8.2 mg/dL — ABNORMAL LOW (ref 8.9–10.3)
Chloride: 111 mmol/L (ref 98–111)
Creatinine, Ser: 0.87 mg/dL (ref 0.44–1.00)
GFR, Estimated: >60 mL/min (ref >60)
Glucose, Bld: 236 mg/dL — ABNORMAL HIGH (ref 70–99)
Potassium: 4.3 mmol/L (ref 3.5–5.1)
Sodium: 137 mmol/L (ref 135–145)

## 2022-06-06 LAB — CBC
HCT: 25.5 % — ABNORMAL LOW (ref 36.0–46.0)
Hemoglobin: 7.8 g/dL — ABNORMAL LOW (ref 12.0–15.0)
MCH: 31.6 pg (ref 26.0–34.0)
MCHC: 30.6 g/dL (ref 30.0–36.0)
MCV: 103.2 fL — ABNORMAL HIGH (ref 80.0–100.0)
Platelets: 84 10*3/uL — ABNORMAL LOW (ref 150–400)
RBC: 2.47 MIL/uL — ABNORMAL LOW (ref 3.87–5.11)
RDW: 17.1 % — ABNORMAL HIGH (ref 11.5–15.5)
WBC: 3.4 10*3/uL — ABNORMAL LOW (ref 4.0–10.5)
nRBC: 0 % (ref 0.0–0.2)

## 2022-06-06 LAB — GLUCOSE, CAPILLARY
Glucose-Capillary: 161 mg/dL — ABNORMAL HIGH (ref 70–99)
Glucose-Capillary: 139 mg/dL — ABNORMAL HIGH (ref 70–99)
Glucose-Capillary: 177 mg/dL — ABNORMAL HIGH (ref 70–99)
Glucose-Capillary: 135 mg/dL — ABNORMAL HIGH (ref 70–99)

## 2022-06-06 LAB — VITAMIN B12: Vitamin B-12: 600 pg/mL (ref 180–914)

## 2022-06-06 NOTE — Evaluation (Signed)
Physical Therapy Evaluation Patient Details Name: Ariel Arnold MRN: [id number] DOB: [birth year] Today's Date: 06/06/2022  History of Present Illness  66 y.o. female presented to the ER in [place] on 06/01/2022, with complaints of confusion and fever as well as foul-smelling discharge coming from left foot amputation site. Found to have osteomyelitis      s/p revision L TMA per podiatry on 06/04/22.  PMH: HTN type II DM, PVD, [name] leading to liver cirrhosis, chronic pancytopenia, HLD, depression, hx L TMA 03/2022  Clinical Impression  Pt admitted with above diagnosis.  Pt able to take a few steps in room with RW and min assist; fatigues rapidly and is at risk for falls, will benefit from SNF post acute to maximize safety and independence.  Pt currently with functional limitations due to the deficits listed below (see PT Problem List). Pt will benefit from skilled PT to increase their independence and safety with mobility to allow discharge to the venue listed below.          Recommendations for follow up therapy are one component of a multi-disciplinary discharge planning process, led by the attending physician.  Recommendations may be updated based on patient status, additional functional criteria and insurance authorization.  Follow Up Recommendations Skilled nursing-short term rehab (<3 hours/day)    Assistance Recommended at Discharge Intermittent Supervision/Assistance  Patient can return home with the following  A little help with walking and/or transfers;Assist for transportation;Help with stairs or ramp for entrance;A little help with bathing/dressing/bathroom    Equipment Recommendations None recommended by PT  Recommendations for Other Services       Functional Status Assessment Patient has had a recent decline in their functional status and demonstrates the ability to make significant improvements in function in a reasonable and predictable amount of time.     Precautions /  Restrictions Precautions Precautions: Fall Restrictions Weight Bearing Restrictions: Yes LLE Weight Bearing: Non weight bearing      Mobility  Bed Mobility               General bed mobility comments: in recliner    Transfers Overall transfer level: Needs assistance Equipment used: Rolling walker (2 wheels) Transfers: Sit to/from Stand Sit to Stand: Min assist           General transfer comment: cues for hand placement, NWB. assist to rise and stabilize, transition to RW    Ambulation/Gait Ambulation/Gait assistance: Min assist Gait Distance (Feet): 3 Feet Assistive device: Rolling walker (2 wheels)         General Gait Details: 3 to 4 steps with cues for use of RW position, min assist to balance and maneuver RW. good adherence NWB onLLE. fatigues quickly requiring seated rest  Stairs            Wheelchair Mobility    Modified Rankin (Stroke Patients Only)       Balance           Standing balance support: Reliant on assistive device for balance, During functional activity, Bilateral upper extremity supported Standing balance-Leahy Scale: Poor                               Pertinent Vitals/Pain Pain Assessment Faces Pain Scale: Hurts little more Pain Location: LLE Pain Descriptors / Indicators: Discomfort, Grimacing, Operative site guarding Pain Intervention(s): Limited activity within patient's tolerance, Monitored during session, Premedicated before session    Home Living Family/patient expects to  be discharged to:: Skilled nursing facility                        Prior Function Prior Level of Function : Needs assist;Independent/Modified Independent               ADLs Comments: patient reported being independent in ADLs at home prior to hospitalization     Hand Dominance   Dominant Hand: Right    Extremity/Trunk Assessment   Upper Extremity Assessment Upper Extremity Assessment: Defer to OT  evaluation;Generalized weakness    Lower Extremity Assessment Lower Extremity Assessment: Generalized weakness;LLE deficits/detail LLE Deficits / Details: ankle to neutral, df limited by pain. knee and hip AROM grossly WFL, strength ~ 3+/5    Cervical / Trunk Assessment Cervical / Trunk Assessment: Normal  Communication   Communication: No difficulties  Cognition Arousal/Alertness: Awake/alert Behavior During Therapy: WFL for tasks assessed/performed Overall Cognitive Status: Within Functional Limits for tasks assessed                                          General Comments      Exercises General Exercises - Lower Extremity Ankle Circles/Pumps: AROM, 5 reps, Both   Assessment/Plan    PT Assessment Patient needs continued PT services  PT Problem List Decreased strength;Decreased mobility;Decreased range of motion;Decreased activity tolerance;Decreased balance;Pain;Decreased knowledge of use of DME       PT Treatment Interventions DME instruction;Therapeutic exercise;Therapeutic activities;Patient/family education;Gait training;Balance training;Functional mobility training    PT Goals (Current goals can be found in the Care Plan section)  Acute Rehab PT Goals Patient Stated Goal: home PT Goal Formulation: With patient Time For Goal Achievement: 06/20/22 Potential to Achieve Goals: Good    Frequency Min 3X/week     Co-evaluation               AM-PAC PT "6 Clicks" Mobility  Outcome Measure Help needed turning from your back to your side while in a flat bed without using bedrails?: A Little Help needed moving from lying on your back to sitting on the side of a flat bed without using bedrails?: A Little Help needed moving to and from a bed to a chair (including a wheelchair)?: A Little Help needed standing up from a chair using your arms (e.g., wheelchair or bedside chair)?: A Little Help needed to walk in hospital room?: A Lot Help needed  climbing 3-5 steps with a railing? : Total 6 Click Score: 15    End of Session Equipment Utilized During Treatment: Gait belt Activity Tolerance: Patient tolerated treatment well Patient left: with call bell/phone within reach;in chair;with chair alarm set Nurse Communication: Mobility status PT Visit Diagnosis: Other abnormalities of gait and mobility (R26.89);Difficulty in walking, not elsewhere classified (R26.2)    Time: 1210-1228 PT Time Calculation (min) (ACUTE ONLY): 18 min   Charges:              Delice Bison, PT  Acute Rehab Dept [facility]) [phone] Pager [phone]  06/06/2022   [facility] 06/06/2022, 2:14 PM

## 2022-06-06 NOTE — Progress Notes (Signed)
PROGRESS NOTE    Ariel Arnold  [id number] DOB: Nov 04, 1956 DOA: 06/02/2022 PCP: Jacqualine Code, DO    Brief Narrative:  Ariel Arnold is a 66 y.o. female with medical history significant of HTN type II DM, PVD, [name] leading to liver cirrhosis, chronic pancytopenia, HLD, depression presented to the ER in [place] on 06/01/2022, with complaints of confusion and fever as well as foul-smelling discharge coming from left foot amputation site.  Found to have osteomyelitis and gram + bacteremia at OSH (MRSA on  culture)   Assessment and Plan:  Gram + bacteremia(MRSA)/left foot wound infection:  - IV Vanco  - MRI of the left foot- with osteo - podiatry consult: s/p surgery on 6/17 -cult[facility] but [facility] -ID consult -echo   NASH cirrhosis:  -Patient apparently had hepatic encephalopathy which has resolved.  Continue monitoring   essential hypertension:  -BP low so will hold home meds   type 2 diabetes:  -Hemoglobin A1c more than 11.   -Continue with sliding scale insulin -resume insulin and increase   chronic kidney disease stage II:  -continue to monitor   mixed hyperlipidemia:  -resume home meds   GERD:  -Continue PPIs  Anemia -unclear etiology -defer to outpatient for work up -transfuse for <7 MCV >100  DVT prophylaxis: enoxaparin (LOVENOX) injection 40 mg Start: 06/03/22 1000    Code Status: Full Code Family Communication: called daughter 6/17  Disposition Plan:  Level of care: Med-Surg Status is: Inpatient Remains inpatient appropriate because: await blood cultures    Consultants:  ID podiatry   Subjective: No complaints this AM  Objective: Vitals:   06/05/22 1041 06/05/22 1431 06/05/22 2308 06/06/22 0636  BP: 114/85 (!) 112/56 (!) 105/52 (!) 119/51  Pulse: 82 79 83 80  Resp: 14 14 18 18   Temp: 98.4 F (36.9 C) 98.2 F (36.8 C) 98 F (36.7 C) 97.9 F (36.6 C)  TempSrc: Oral Oral Oral Oral  SpO2: 100%  100% 100% 100%  Weight:      Height:        Intake/Output Summary (Last 24 hours) at 06/06/2022 1243 Last data filed at 06/06/2022 0636 Gross per 24 hour  Intake 2666.25 ml  Output 360 ml  Net 2306.25 ml   Filed Weights   06/02/22 2244  Weight: 85.6 kg    Examination:   General: Appearance:    Obese female in no acute distress     Lungs:     respirations unlabored  Heart:    Normal heart rate.     MS:   Left foot wrapped    Neurologic:   Awake, alert, pleasant and cooperative       Data Reviewed: I have personally reviewed following labs and imaging studies  CBC: Recent Labs  Lab 06/03/22 0012 06/05/22 0313 06/06/22 0315  WBC 2.3* 2.6* 3.4*  NEUTROABS 1.2*  --   --   HGB 7.9* 7.7* 7.8*  HCT 25.5* 24.9* 25.5*  MCV 104.5* 105.5* 103.2*  PLT 83* 75* 84*   Basic Metabolic Panel: Recent Labs  Lab 06/03/22 0012 06/05/22 0313 06/06/22 0315  NA 136 135 137  K 3.7 3.9 4.3  CL 108 111 111  CO2 20* 20* 22  GLUCOSE 197* 258* 236*  BUN 24* 20 23  CREATININE 0.96 0.84 0.87  CALCIUM 7.9* 8.0* 8.2*   GFR: Estimated Creatinine Clearance: 69.1 mL/min (by C-G formula based on SCr of 0.87 mg/dL). Liver Function Tests: Recent Labs  Lab 06/03/22 0012  AST 34  ALT 26  ALKPHOS 96  BILITOT 0.8  PROT 6.0*  ALBUMIN 2.4*   No results for input(s): "LIPASE", "AMYLASE" in the last 168 hours. No results for input(s): "AMMONIA" in the last 168 hours. Coagulation Profile: No results for input(s): "INR", "PROTIME" in the last 168 hours. Cardiac Enzymes: No results for input(s): "CKTOTAL", "CKMB", "CKMBINDEX", "TROPONINI" in the last 168 hours. BNP (last 3 results) No results for input(s): "PROBNP" in the last 8760 hours. HbA1C: No results for input(s): "HGBA1C" in the last 72 hours. CBG: Recent Labs  Lab 06/05/22 1105 06/05/22 1612 06/05/22 2310 06/06/22 0726 06/06/22 1224  GLUCAP 166* 106* 134* 161* 139*   Lipid Profile: No results for input(s): "CHOL",  "HDL", "LDLCALC", "TRIG", "CHOLHDL", "LDLDIRECT" in the last 72 hours. Thyroid Function Tests: No results for input(s): "TSH", "T4TOTAL", "FREET4", "T3FREE", "THYROIDAB" in the last 72 hours. Anemia Panel: Recent Labs    06/06/22 0315  VITAMINB12 600   Sepsis Labs: No results for input(s): "PROCALCITON", "LATICACIDVEN" in the last 168 hours.  Recent Results (from the past 240 hour(s))  MRSA Next Gen by PCR, Nasal     Status: Abnormal   Collection Time: 06/03/22  6:05 AM   Specimen: Nasal Mucosa; Nasal Swab  Result Value Ref Range Status   MRSA by PCR Next Gen DETECTED (A) NOT DETECTED Final    Comment: RESULT CALLED TO, READ BACK BY AND VERIFIED WITH: HALL, J. RN AT 0827 ON 06/03/2022 BY MECIAL J. (NOTE) The GeneXpert MRSA Assay (FDA approved for NASAL specimens only), is one component of a comprehensive MRSA colonization surveillance program. It is not intended to diagnose MRSA infection nor to guide or monitor treatment for MRSA infections. Test performance is not FDA approved in patients less than 48 years old. Performed at [facility], [address], [place], [place] [address]   Culture, blood (Routine X 2) w Reflex to ID Panel     Status: None (Preliminary result)   Collection Time: 06/03/22  8:58 AM   Specimen: BLOOD  Result Value Ref Range Status   Specimen Description   Final    BLOOD LEFT ANTECUBITAL Performed at [facility], [address] [place], [place] [address]    Special Requests   Final    IN PEDIATRIC BOTTLE Blood Culture adequate volume Performed at [facility], [address] [place], [place] [address]    Culture   Final    NO GROWTH 3 DAYS Performed at [facility], [address] [place], [place] [address]    Report Status PENDING  Incomplete  Culture, blood (Routine X 2) w Reflex to ID Panel     Status: None (Preliminary result)   Collection Time: 06/03/22  8:58 AM   Specimen:  BLOOD LEFT HAND  Result Value Ref Range Status   Specimen Description   Final    BLOOD LEFT HAND Performed at [facility], [address] [place], [place] [address]    Special Requests   Final    IN PEDIATRIC BOTTLE Blood Culture adequate volume Performed at [facility], [address], [place], [place] [address]    Culture   Final    NO GROWTH 3 DAYS Performed at [facility], [address], [place], [place] [address]    Report Status PENDING  Incomplete  Aerobic/Anaerobic Culture w Gram Stain (surgical/deep wound)     Status: None (  Preliminary result)   Collection Time: 06/04/22  9:57 AM   Specimen: Wound  Result Value Ref Range Status   Specimen Description   Final    WOUND BONE Performed at [facility], [address] [place], [place] [address]    Special Requests   Final    LACTOSE FERMENTER Performed at [facility], [address], [place], [place] [address]    Gram Stain   Final    NO SQUAMOUS EPITHELIAL CELLS SEEN RARE WBC SEEN NO ORGANISMS SEEN    Culture   Final    RARE STAPHYLOCOCCUS AUREUS CULTURE REINCUBATED FOR BETTER GROWTH Performed at [facility], [address], [place], [place] [address]    Report Status PENDING  Incomplete         Radiology Studies: No results found.      Scheduled Meds:  acidophilus  1 capsule Oral Daily   diclofenac Sodium  2 g Topical QID   enoxaparin (LOVENOX) injection  40 mg Subcutaneous Q24H   folic acid  1 mg Oral BID   insulin aspart  0-20 Units Subcutaneous TID WC   insulin aspart  0-5 Units Subcutaneous QHS   insulin aspart  5 Units Subcutaneous TID WC   insulin glargine-yfgn  40 Units Subcutaneous Daily   mupirocin ointment  1 Application Nasal BID   pravastatin  20 mg Oral QHS   sertraline  200 mg Oral Daily   Continuous Infusions:  vancomycin 1,000 mg (06/06/22 0141)     LOS: 4 days    Time spent: 35  minutes spent on chart review, discussion with nursing staff, consultants, updating family and interview/physical exam; more than 50% of that time was spent in counseling and/or coordination of care.    Joseph Art, DO [facility] Available via Epic secure chat 7am-7pm After these hours, please refer to coverage provider listed on [email or web address] 06/06/2022, 12:43 PM

## 2022-06-06 NOTE — Evaluation (Signed)
Occupational Therapy Evaluation Patient Details Name: Ariel Arnold MRN: [id number] DOB: [birth year] Today's Date: 06/06/2022   History of Present Illness 66 y.o. female presented to the ER in [place] on 06/01/2022, with complaints of confusion and fever as well as foul-smelling discharge coming from left foot amputation site. Found to have osteomyelitis      s/p revision L TMA per podiatry on 06/04/22.  PMH: HTN type II DM, PVD, [name] leading to liver cirrhosis, chronic pancytopenia, HLD, depression, hx L TMA 03/2022   Clinical Impression   Patient is a 66 year old female who was admitted for above. Patient was independent prior to this hospital admission per patient report. Patient was noted to have a difficult time with maintaining WB status with attempted transfer with consistent multimodal cues needed to keep LLE off the floor. Patient was noted to have decreased functional activity tolernace, decreased ROM, decreased BUE strength, decreased endurance, decreased sitting balance, decreased standing balanced, decreased safety awareness, and decreased knowledge of AE/AD impacting participation in ADLs. Patient would continue to benefit from skilled OT services at this time while admitted and after d/c to address noted deficits in order to improve overall safety and independence in ADLs.         Recommendations for follow up therapy are one component of a multi-disciplinary discharge planning process, led by the attending physician.  Recommendations may be updated based on patient status, additional functional criteria and insurance authorization.   Follow Up Recommendations  Skilled nursing-short term rehab (<3 hours/day)    Assistance Recommended at Discharge Frequent or constant Supervision/Assistance  Patient can return home with the following A lot of help with walking and/or transfers;A lot of help with bathing/dressing/bathroom;Assistance with cooking/housework;Direct supervision/assist for  financial management;Assist for transportation;Direct supervision/assist for medications management;Help with stairs or ramp for entrance    Functional Status Assessment  Patient has had a recent decline in their functional status and demonstrates the ability to make significant improvements in function in a reasonable and predictable amount of time.  Equipment Recommendations  None recommended by OT    Recommendations for Other Services       Precautions / Restrictions Precautions Precautions: Fall Restrictions Weight Bearing Restrictions: Yes LLE Weight Bearing: Non weight bearing      Mobility Bed Mobility Overal bed mobility: Needs Assistance Bed Mobility: Supine to Sit     Supine to sit: Min guard, HOB elevated          Transfers                          Balance Overall balance assessment: Needs assistance Sitting-balance support: No upper extremity supported Sitting balance-Leahy Scale: Good     Standing balance support: Bilateral upper extremity supported, Reliant on assistive device for balance Standing balance-Leahy Scale: Poor Standing balance comment: needs BUE support to maintian NWB LLE                           ADL either performed or assessed with clinical judgement   ADL Overall ADL's : Needs assistance/impaired Eating/Feeding: Set up;Sitting   Grooming: Set up;Sitting   Upper Body Bathing: Set up;Sitting   Lower Body Bathing: Moderate assistance;Sitting/lateral leans   Upper Body Dressing : Set up;Sitting   Lower Body Dressing: Moderate assistance;Sitting/lateral leans   Toilet Transfer: Moderate assistance Toilet Transfer Details (indicate cue type and reason): patient was mod A to transfer from edge of  bed to recliner in room with continued verbal and tatcile cues to keep LLE off the ground to hop to chair. patient reported " i am too old to hop". Toileting- Clothing Manipulation and Hygiene: Maximal assistance;Sit  to/from stand               Vision Patient Visual Report: No change from baseline       Perception     Praxis      Pertinent Vitals/Pain Pain Assessment Pain Assessment: Faces Faces Pain Scale: Hurts little more Pain Location: LLE Pain Descriptors / Indicators: Discomfort, Grimacing, Operative site guarding Pain Intervention(s): Limited activity within patient's tolerance, Monitored during session, Premedicated before session, Repositioned     Hand Dominance Right   Extremity/Trunk Assessment Upper Extremity Assessment Upper Extremity Assessment: Generalized weakness (patient is able to abduct BUE over 110 degrees patient reported increased pain with attempted FF with h/o RTC issues)   Lower Extremity Assessment Lower Extremity Assessment: Defer to PT evaluation   Cervical / Trunk Assessment Cervical / Trunk Assessment: Normal   Communication Communication Communication: No difficulties   Cognition Arousal/Alertness: Awake/alert Behavior During Therapy: WFL for tasks assessed/performed Overall Cognitive Status: Within Functional Limits for tasks assessed                                       General Comments       Exercises     Shoulder Instructions      Home Living Family/patient expects to be discharged to:: Skilled nursing facility                                        Prior Functioning/Environment Prior Level of Function : Needs assist;Independent/Modified Independent               ADLs Comments: patient reported being independent in ADLs at home prior to hospitalization        OT Problem List: Decreased activity tolerance;Impaired balance (sitting and/or standing);Decreased safety awareness;Cardiopulmonary status limiting activity;Decreased knowledge of precautions;Decreased knowledge of use of DME or AE      OT Treatment/Interventions: Self-care/ADL training;Therapeutic exercise;Neuromuscular  education;Energy conservation;DME and/or AE instruction;Therapeutic activities;Balance training;Patient/family education    OT Goals(Current goals can be found in the care plan section) Acute Rehab OT Goals Patient Stated Goal: to get better OT Goal Formulation: With patient Time For Goal Achievement: 06/20/22 Potential to Achieve Goals: Good  OT Frequency: Min 2X/week    Co-evaluation              AM-PAC OT "6 Clicks" Daily Activity     Outcome Measure Help from another person eating meals?: None Help from another person taking care of personal grooming?: None Help from another person toileting, which includes using toliet, bedpan, or urinal?: A Lot Help from another person bathing (including washing, rinsing, drying)?: A Lot Help from another person to put on and taking off regular upper body clothing?: A Little Help from another person to put on and taking off regular lower body clothing?: A Lot 6 Click Score: 17   End of Session Equipment Utilized During Treatment: Rolling walker (2 wheels);Gait belt  Activity Tolerance: Patient tolerated treatment well Patient left: in chair;with chair alarm set;with call bell/phone within reach  OT Visit Diagnosis: Unsteadiness on feet (R26.81);Other abnormalities of gait and mobility (  R26.89);Muscle weakness (generalized) (M62.81);Pain                Time: 0626-9485 OT Time Calculation (min): 34 min Charges:  OT General Charges $OT Visit: 1 Visit OT Evaluation $OT Eval Moderate Complexity: 1 Mod OT Treatments $Therapeutic Activity: 8-22 mins  Sharyn Blitz OTR/L, MS Acute Rehabilitation Department Office# [phone] Pager# [phone]   Ardyth Harps 06/06/2022, 1:11 PM

## 2022-06-06 NOTE — Progress Notes (Signed)
Subjective: No new complaints   Antibiotics:  Anti-infectives (From admission, onward)    Start     Dose/Rate Route Frequency Ordered Stop   06/04/22 0200  vancomycin (VANCOCIN) IVPB 1000 mg/200 mL premix        1,000 mg 200 mL/hr over 60 Minutes Intravenous Every 24 hours 06/03/22 0050     06/03/22 0800  ceFEPIme (MAXIPIME) 2 g in sodium chloride 0.9 % 100 mL IVPB  Status:  Discontinued        2 g 200 mL/hr over 30 Minutes Intravenous Every 8 hours 06/03/22 0050 06/06/22 1136   06/03/22 0045  vancomycin (VANCOREADY) IVPB 1500 mg/300 mL        1,500 mg 150 mL/hr over 120 Minutes Intravenous  Once 06/02/22 2359 06/03/22 0318   06/03/22 0045  ceFEPIme (MAXIPIME) 2 g in sodium chloride 0.9 % 100 mL IVPB        2 g 200 mL/hr over 30 Minutes Intravenous  Once 06/02/22 2359 06/03/22 0050       Medications: Scheduled Meds:  acidophilus  1 capsule Oral Daily   diclofenac Sodium  2 g Topical QID   enoxaparin (LOVENOX) injection  40 mg Subcutaneous [id number]   folic acid  1 mg Oral BID   insulin aspart  0-20 Units Subcutaneous TID WC   insulin aspart  0-5 Units Subcutaneous QHS   insulin aspart  5 Units Subcutaneous TID WC   insulin glargine-yfgn  40 Units Subcutaneous Daily   mupirocin ointment  1 Application Nasal BID   pravastatin  20 mg Oral QHS   sertraline  200 mg Oral Daily   Continuous Infusions:  vancomycin 1,000 mg (06/06/22 0141)   PRN Meds:.albuterol, camphor-menthol, diphenhydrAMINE, gabapentin, ketorolac, morphine injection, ondansetron **OR** ondansetron (ZOFRAN) IV    Objective: Weight change:   Intake/Output Summary (Last 24 hours) at 06/06/2022 1847 Last data filed at 06/06/2022 0636 Gross per 24 hour  Intake 1480 ml  Output 360 ml  Net 1120 ml   Blood pressure (!) 120/51, pulse 72, temperature (!) 97.1 F (36.2 C), resp. rate 14, height 5\' 6"  (1.676 m), weight 85.6 kg, SpO2 100 %. Temp:  [97.1 F (36.2 C)-98 F (36.7 C)] 97.1 F (36.2 C)  (06/19 1533) Pulse Rate:  [72-83] 72 (06/19 1533) Resp:  [14-18] 14 (06/19 1533) BP: (105-120)/(51-52) 120/51 (06/19 1533) SpO2:  [100 %] 100 % (06/19 1533)  Physical Exam: Physical Exam Constitutional:      General: She is not in acute distress.    Appearance: She is well-developed. She is not diaphoretic.  HENT:     Head: Normocephalic and atraumatic.     Right Ear: External ear normal.     Left Ear: External ear normal.     Mouth/Throat:     Pharynx: No oropharyngeal exudate.  Eyes:     General: No scleral icterus.    Conjunctiva/sclera: Conjunctivae normal.     Pupils: Pupils are equal, round, and reactive to light.  Cardiovascular:     Rate and Rhythm: Normal rate and regular rhythm.  Pulmonary:     Effort: Pulmonary effort is normal. No respiratory distress.     Breath sounds: No wheezing.  Abdominal:     General: There is no distension.     Palpations: Abdomen is soft.     Tenderness: There is no rebound.  Musculoskeletal:        General: No tenderness. Normal range of motion.  Lymphadenopathy:  Cervical: No cervical adenopathy.  Skin:    General: Skin is warm and dry.     Coloration: Skin is not pale.     Findings: No erythema or rash.  Neurological:     General: No focal deficit present.     Mental Status: She is alert and oriented to person, place, and time.     Motor: No abnormal muscle tone.     Coordination: Coordination normal.  Psychiatric:        Mood and Affect: Mood normal.        Behavior: Behavior normal.        Thought Content: Thought content normal.        Judgment: Judgment normal.     Lower extremity bandaged  CBC:    BMET Recent Labs    06/05/22 0313 06/06/22 0315  NA 135 137  K 3.9 4.3  CL 111 111  CO2 20* 22  GLUCOSE 258* 236*  BUN 20 23  CREATININE 0.84 0.87  CALCIUM 8.0* 8.2*     Liver Panel  No results for input(s): "PROT", "ALBUMIN", "AST", "ALT", "ALKPHOS", "BILITOT", "BILIDIR", "IBILI" in the last 72  hours.     Sedimentation Rate No results for input(s): "ESRSEDRATE" in the last 72 hours. C-Reactive Protein No results for input(s): "CRP" in the last 72 hours.  Micro Results: Recent Results (from the past 720 hour(s))  MRSA Next Gen by PCR, Nasal     Status: Abnormal   Collection Time: 06/03/22  6:05 AM   Specimen: Nasal Mucosa; Nasal Swab  Result Value Ref Range Status   MRSA by PCR Next Gen DETECTED (A) NOT DETECTED Final    Comment: RESULT CALLED TO, READ BACK BY AND VERIFIED WITH: HALL, J. RN AT 0827 ON 06/03/2022 BY MECIAL J. (NOTE) The GeneXpert MRSA Assay (FDA approved for NASAL specimens only), is one component of a comprehensive MRSA colonization surveillance program. It is not intended to diagnose MRSA infection nor to guide or monitor treatment for MRSA infections. Test performance is not FDA approved in patients less than 70 years old. Performed at [facility], [address] [place], [place] [address]   Culture, blood (Routine X 2) w Reflex to ID Panel     Status: None (Preliminary result)   Collection Time: 06/03/22  8:58 AM   Specimen: BLOOD  Result Value Ref Range Status   Specimen Description   Final    BLOOD LEFT ANTECUBITAL Performed at [facility], [address], [place], [place] [address]    Special Requests   Final    IN PEDIATRIC BOTTLE Blood Culture adequate volume Performed at [facility], [address], [place], [place] [address]    Culture   Final    NO GROWTH 3 DAYS Performed at [facility], [address] [place], [place] [address]    Report Status PENDING  Incomplete  Culture, blood (Routine X 2) w Reflex to ID Panel     Status: None (Preliminary result)   Collection Time: 06/03/22  8:58 AM   Specimen: BLOOD LEFT HAND  Result Value Ref Range Status   Specimen Description   Final    BLOOD LEFT HAND Performed at [facility], [address]  [place], [place] [address]    Special Requests   Final    IN PEDIATRIC BOTTLE Blood Culture adequate volume Performed at [facility], [address] [place], [place] [address]  Culture   Final    NO GROWTH 3 DAYS Performed at [facility], [address] [place], [place] [address]    Report Status PENDING  Incomplete  Aerobic/Anaerobic Culture w Gram Stain (surgical/deep wound)     Status: None (Preliminary result)   Collection Time: 06/04/22  9:57 AM   Specimen: Wound  Result Value Ref Range Status   Specimen Description   Final    WOUND BONE Performed at [facility], [address], [place], [place] [address]    Special Requests   Final    LACTOSE FERMENTER Performed at [facility], [address] [place], [place] [address]    Gram Stain   Final    NO SQUAMOUS EPITHELIAL CELLS SEEN RARE WBC SEEN NO ORGANISMS SEEN Performed at [facility], [address] [place], [place] [address]    Culture   Final    RARE STAPHYLOCOCCUS AUREUS SUSCEPTIBILITIES TO FOLLOW NO ANAEROBES ISOLATED; CULTURE IN PROGRESS FOR 5 DAYS    Report Status PENDING  Incomplete    Studies/Results: No results found.    Assessment/Plan:  INTERVAL HISTORY:   Blood cultures from [place] come back positive with MRSA and 2/2 sites   Principal Problem:   MRSA bacteremia Active Problems:   Liver cirrhosis secondary to NASH (HCC)   Acquired absence of left foot (HCC)   Anemia in chronic kidney disease   Depressive disorder   Mixed hyperlipidemia   Non-healing wound of amputation stump (HCC)   Stage 2 chronic kidney disease due to type 2 diabetes mellitus (HCC)   Type 2 diabetes mellitus treated with insulin (HCC)   Essential hypertension   Cellulitis of left foot    Ariel Arnold is a 66 y.o. female with history of poorly controlled diabetes mellitus hypertension NASH with chronic pancytopenia, esophageal  varices who presented [facility] emergency department with left foot drainage at site of prior amputation.  She had undergone left first metatarsal partial amputation November 2022 but had remaining infected bone and was treated for MRSA infection with daptomycin for 6 weeks.  She did not present for outpatient follow-up with infectious disease but returned to podiatry in January 18, 2022 with an MRI that the February 2023 showed ongoing osteomyelitis she was then lost to follow-up and presented emergency department in April and underwent transmetatarsal amputation.  She then again had missed follow-up appointments but ultimately sent to the ER in [place] where blood cultures were taken which are now growing MRSA from 2 of 2 sites.  She was started on appropriate antibiotics in [place] and transferred here.  MRI that was done in [place] was also repeated here.  She has now undergone revision of her transmetatarsal amputation.  Podiatry felt good about what they have achieved surgically and we are recommending oral antibiotics however given the fact the patient has known MRSA bacteremia she is going to need parenteral antibiotics.    #1 MRSA bacteremia undoubtedly due to her infected foot at site of transmetatarsal amputation.  I have ordered 2D echocardiogram.  Fortunately her blood cultures have cleared here on admission after receiving antibiotics in [place].  I doubt that she would be an appropriate candidate for transesophageal echocardiogram given her esophageal varices history and given her pancytopenia notably her thrombocytopenia in particular.  I would plan on giving her 6 weeks of systemic IV antibiotics which could be done with the vancomycin that she is currently receiving.  I spent  37 minutes with the patient including than 50% of the time in face to face counseling of the patient regarding the nature of osteomyelitis potential endocarditis along with review of medical  records in preparation for the visit and during the visit and in coordination of her care.    LOS: 4 days   Alcide Evener 06/06/2022, 6:47 PM

## 2022-06-06 NOTE — Care Management Important Message (Signed)
Important Message  Patient Details IM Letter given to the Patient. Name: Ariel Arnold MRN: [id number] Date of Birth: 11/26/1956   Medicare Important Message Given:  Yes     Caren Macadam 06/06/2022, 1:44 PM

## 2022-06-06 NOTE — Progress Notes (Signed)
Physical Therapy Treatment Patient Details Name: MARCINA Arnold MRN: [id number] DOB: 09-21-1956 Today's Date: 06/06/2022   History of Present Illness 66 y.o. female presented to the ER in [place] on 06/01/2022, with complaints of confusion and fever as well as foul-smelling discharge coming from left foot amputation site. Found to have osteomyelitis      s/p revision L TMA per podiatry on 06/04/22.  PMH: HTN type II DM, PVD, [name] leading to liver cirrhosis, chronic pancytopenia, HLD, depression, hx L TMA 03/2022    PT Comments    Pt cooperative and motivated to mobilize. See below for details, emphasis on incr independence with transfers this session.  Continue to recommend SNF post acute    Recommendations for follow up therapy are one component of a multi-disciplinary discharge planning process, led by the attending physician.  Recommendations may be updated based on patient status, additional functional criteria and insurance authorization.  Follow Up Recommendations  Skilled nursing-short term rehab (<3 hours/day)     Assistance Recommended at Discharge Intermittent Supervision/Assistance  Patient can return home with the following A little help with walking and/or transfers;Assist for transportation;Help with stairs or ramp for entrance;A little help with bathing/dressing/bathroom   Equipment Recommendations  None recommended by PT    Recommendations for Other Services       Precautions / Restrictions Precautions Precautions: Fall Restrictions Weight Bearing Restrictions: Yes LLE Weight Bearing: Non weight bearing     Mobility  Bed Mobility Overal bed mobility: Needs Assistance Bed Mobility: Sit to Supine, Supine to Sit     Supine to sit: Supervision, Min guard Sit to supine: Supervision, Min guard   General bed mobility comments: for safety, incr time, bed flat    Transfers Overall transfer level: Needs assistance Equipment used: Rolling walker (2  wheels) Transfers: Sit to/from Stand, Bed to chair/wheelchair/BSC Sit to Stand: Min assist   Step pivot transfers: Min assist       General transfer comment: cues for hand placement, NWB. assist to rise and stabilize, transition to RW; stand-step/pivot on RLE p bed <> BSC transfer wtih min assist for balance    Ambulation/Gait Ambulation/Gait assistance: Min assist Gait Distance (Feet): 3 Feet Assistive device: Rolling walker (2 wheels)         General Gait Details: 3 to 4 steps with cues for use of RW position, min assist to balance and maneuver RW. good adherence NWB onLLE. fatigues quickly requiring seated rest   Stairs             Wheelchair Mobility    Modified Rankin (Stroke Patients Only)       Balance Overall balance assessment: Needs assistance Sitting-balance support: No upper extremity supported Sitting balance-Leahy Scale: Good Sitting balance - Comments: pt able to perfomr her own peri-care in sitting   Standing balance support: Reliant on assistive device for balance, During functional activity, Bilateral upper extremity supported Standing balance-Leahy Scale: Poor                              Cognition Arousal/Alertness: Awake/alert Behavior During Therapy: WFL for tasks assessed/performed Overall Cognitive Status: Within Functional Limits for tasks assessed                                          Exercises General Exercises - Lower Extremity Ankle Circles/Pumps: AROM,  5 reps, Both    General Comments        Pertinent Vitals/Pain Pain Assessment Pain Assessment: Faces Faces Pain Scale: Hurts a little bit Pain Location: LLE Pain Descriptors / Indicators: Discomfort, Grimacing, Operative site guarding Pain Intervention(s): Limited activity within patient's tolerance, Monitored during session, Repositioned    Home Living Family/patient expects to be discharged to:: Skilled nursing facility                         Prior Function            PT Goals (current goals can now be found in the care plan section) Acute Rehab PT Goals Patient Stated Goal: home PT Goal Formulation: With patient Time For Goal Achievement: 06/20/22 Potential to Achieve Goals: Good Progress towards PT goals: Progressing toward goals    Frequency    Min 3X/week      PT Plan Current plan remains appropriate    Co-evaluation              AM-PAC PT "6 Clicks" Mobility   Outcome Measure  Help needed turning from your back to your side while in a flat bed without using bedrails?: A Little Help needed moving from lying on your back to sitting on the side of a flat bed without using bedrails?: A Little Help needed moving to and from a bed to a chair (including a wheelchair)?: A Little Help needed standing up from a chair using your arms (e.g., wheelchair or bedside chair)?: A Little Help needed to walk in hospital room?: A Lot Help needed climbing 3-5 steps with a railing? : Total 6 Click Score: 15    End of Session Equipment Utilized During Treatment: Gait belt Activity Tolerance: Patient tolerated treatment well Patient left: with call bell/phone within reach;in bed;with bed alarm set Nurse Communication: Mobility status PT Visit Diagnosis: Other abnormalities of gait and mobility (R26.89);Difficulty in walking, not elsewhere classified (R26.2)     Time: 1007-1219 PT Time Calculation (min) (ACUTE ONLY): 29 min  Charges:  $Therapeutic Activity: 23-37 mins                     Delice Bison, PT  Acute Rehab Dept ([facility]) [phone] Pager [phone]  06/06/2022    [facility] 06/06/2022, 3:31 PM

## 2022-06-06 NOTE — Progress Notes (Signed)
Subjective:  Patient ID: Ariel Arnold, female    DOB: 04-18-1956,  MRN: [id number]  Patient seen at bedside this morning s/p revision transmetatarsal amputation on left. Relates she is doing well and not having too much pain. Hoping to go soon to facility.   Past Medical History:  Diagnosis Date   Anemia    Anxiety    Aortic atherosclerosis (HCC)    Arthritis    AVM (arteriovenous malformation) of colon    Depression    Essential hypertension    GI bleeding    Recurrent   Hepatic encephalopathy (HCC)    History of kidney stones    History of RSV infection    Liver cirrhosis secondary to NASH (HCC) 2004   Biopsy-proven   Obesity    Osteomyelitis (HCC)    Peripheral vascular disease (HCC)    Thrombocytopenia (HCC)    Type 2 diabetes mellitus (HCC)      Past Surgical History:  Procedure Laterality Date   ABDOMINAL HYSTERECTOMY     AMPUTATION Left 04/05/2022   Procedure: Metatarsal amputation revision;  Surgeon: Louann Sjogren, MD;  Location: [facility];  Service: Podiatry;  Laterality: Left;  Surgical team to do block   COLONOSCOPY     Cystoscopy with ureteral stent     ESOPHAGOGASTRODUODENOSCOPY     ESOPHAGOGASTRODUODENOSCOPY (EGD) WITH PROPOFOL N/A 05/20/2021   Procedure: ESOPHAGOGASTRODUODENOSCOPY (EGD) WITH PROPOFOL;  Surgeon: Corbin Ade, MD;  Location: [facility];  Service: Endoscopy;  Laterality: N/A;  needs intubation   IR ANGIOGRAM FOLLOW UP STUDY  05/21/2021   IR ANGIOGRAM SELECTIVE EACH ADDITIONAL VESSEL  05/21/2021   IR EMBO VENOUS NOT HEMORR HEMANG  INC GUIDE ROADMAPPING  05/21/2021   IR US GUIDE VASC ACCESS RIGHT  05/21/2021   IR VENOGRAM RENAL UNI LEFT  05/21/2021   RADIOLOGY WITH ANESTHESIA N/A 05/21/2021   Procedure: IR WITH ANESTHESIA;  Surgeon: Radiologist, Medication, MD;  Location: [facility];  Service: Radiology;  Laterality: N/A;   Toe amputations Bilateral    TUBAL LIGATION     WOUND DEBRIDEMENT Left 11/12/2021   Procedure: DEBRIDEMENT WOUND;FIRST METATARSAL  RESECTION; APPLICATION OF SKIN GRAFT SUBSTITUTE;  Surgeon: Park Liter, DPM;  Location: [facility];  Service: Podiatry;  Laterality: Left;       Latest Ref Rng & Units 06/06/2022    3:15 AM 06/05/2022    3:13 AM 06/03/2022   12:12 AM  CBC  WBC 4.0 - 10.5 K/uL 3.4  2.6  2.3   Hemoglobin 12.0 - 15.0 g/dL 7.8  7.7  7.9   Hematocrit 36.0 - 46.0 % 25.5  24.9  25.5   Platelets 150 - 400 K/uL 84  75  83        Latest Ref Rng & Units 06/06/2022    3:15 AM 06/05/2022    3:13 AM 06/03/2022   12:12 AM  BMP  Glucose 70 - 99 mg/dL 810  175  102   BUN 8 - 23 mg/dL 23  20  24    Creatinine 0.44 - 1.00 mg/dL  5.85  2.77   Sodium 135 - 145 mmol/L 137  135  136   Potassium 3.5 - 5.1 mmol/L 4.3  3.9  3.7   Chloride 98 - 111 mmol/L 111  111  108   CO2 22 - 32 mmol/L 22  20  20    Calcium 8.9 - 10.3 mg/dL 8.2  8.0  7.9      Objective:   Vitals:  06/05/22 2308 06/06/22 0636  BP: (!) 105/52 (!) 119/51  Pulse: 83 80  Resp: 18 18  Temp: 98 F (36.7 C) 97.9 F (36.6 C)  SpO2: 100% 100%    General:AA&O x 3. Normal mood and affect   Vascular: DP and PT pulses 2/4 bilateral. Brisk capillary refill to all digits. Pedal hair present   Neruological. Epicritic sensation grossly intact.   Derm: Dressing clean dry and intact with no strike through noted.   MSK: MMT 5/5 in dorsiflexion, plantar flexion, inversion and eversion. Normal joint ROM without pain or crepitus.       Assessment & Plan:  Patient was evaluated and treated and all questions answered.  S/p L TMA  Patient is ok for discharge from podiatry standpoint.  Dressing to stay intact until follow-up.  Doxycycline po x14  after discharge will alter if needed based on sensitivities. Growing rare staph aureus.  Will need PT for NWB to left lower extremity.  WE will follow-up in one week upon discharge and will schedule for her.   Louann Sjogren, DPM  Accessible via secure chat for questions or concerns.

## 2022-06-07 ENCOUNTER — Inpatient Hospital Stay (HOSPITAL_COMMUNITY): Payer: Medicare Other

## 2022-06-07 ENCOUNTER — Inpatient Hospital Stay: Payer: Self-pay

## 2022-06-07 DIAGNOSIS — N182 Chronic kidney disease, stage 2 (mild): Secondary | ICD-10-CM

## 2022-06-07 DIAGNOSIS — B9562 Methicillin resistant Staphylococcus aureus infection as the cause of diseases classified elsewhere: Secondary | ICD-10-CM | POA: Diagnosis not present

## 2022-06-07 DIAGNOSIS — E1122 Type 2 diabetes mellitus with diabetic chronic kidney disease: Secondary | ICD-10-CM

## 2022-06-07 DIAGNOSIS — R7881 Bacteremia: Secondary | ICD-10-CM | POA: Diagnosis not present

## 2022-06-07 DIAGNOSIS — T8744 Infection of amputation stump, left lower extremity: Principal | ICD-10-CM

## 2022-06-07 LAB — GLUCOSE, CAPILLARY
Glucose-Capillary: 134 mg/dL — ABNORMAL HIGH (ref 70–99)
Glucose-Capillary: 143 mg/dL — ABNORMAL HIGH (ref 70–99)
Glucose-Capillary: 85 mg/dL (ref 70–99)
Glucose-Capillary: 104 mg/dL — ABNORMAL HIGH (ref 70–99)

## 2022-06-07 LAB — ECHOCARDIOGRAM COMPLETE
AR max vel: 2.74 cm2
AV Peak grad: 10 mmHg
Ao pk vel: 1.58 m/s
Area-P 1/2: 3.42 cm2
Height: 66 in
S' Lateral: 2.5 cm
Weight: 3019.42 oz

## 2022-06-07 LAB — SURGICAL PATHOLOGY

## 2022-06-07 MED ORDER — SODIUM CHLORIDE 0.9% FLUSH
10.0000 mL | Freq: Two times a day (BID) | INTRAVENOUS | Status: DC
  Administered 2022-06-07 – 2022-06-08 (×2): 10 mL

## 2022-06-07 MED ORDER — SODIUM CHLORIDE 0.9% FLUSH: 10.0000 mL | INTRAVENOUS | Status: DC | PRN

## 2022-06-07 NOTE — Progress Notes (Addendum)
PHARMACY CONSULT NOTE FOR:  OUTPATIENT  PARENTERAL ANTIBIOTIC THERAPY (OPAT)  Indication: MRSA bacteremia Regimen: Vancomycin 1000 mg IV q24h End date: 07/13/2022  IV antibiotic discharge orders are pended. To discharging provider:  please sign these orders via discharge navigator,  Select New Orders & click on the button choice - Manage This Unsigned Work.     Thank you for allowing pharmacy to be a part of this patient's care.  Georgeann Oppenheim 06/07/2022, 12:15 PM

## 2022-06-07 NOTE — Progress Notes (Signed)
PROGRESS NOTE    Ariel Arnold  [id number] DOB: 08-12-1956 DOA: 06/02/2022 PCP: Jacqualine Code, DO    Brief Narrative:  Ariel Arnold is a 66 y.o. female with medical history significant of HTN type II DM, PVD, [name] leading to liver cirrhosis, chronic pancytopenia, HLD, depression presented to the ER in [place] on 06/01/2022, with complaints of confusion and fever as well as foul-smelling discharge coming from left foot amputation site.  Found to have osteomyelitis and gram + bacteremia at OSH (MRSA on  culture) PICC line ordered and plan is for 6 weeks of abx   Assessment and Plan:  Gram + bacteremia(MRSA)/left foot wound infection:  - IV Vanco  - MRI of the left foot shows osteo - podiatry consult: s/p surgery on 6/17 -cult[facility] but [facility] -ID consult appreciated -echo pending -plan for 6 weeks of abx   NASH cirrhosis:  -Patient apparently had hepatic encephalopathy which has resolved.  Continue monitoring   essential hypertension:  -BP low so will hold home meds   type 2 diabetes:  -Hemoglobin A1c more than 11.   -Continue with sliding scale insulin -resume insulin and increase   chronic kidney disease stage II:  -continue to monitor   Pancytopenia -prob from underlying cirrhosis -outpatient follow up  Obesity Estimated body mass index is 30.46 kg/m as calculated from the following:   Height as of this encounter: 5\' 6"  (1.676 m).   Weight as of this encounter: 85.6 kg.   mixed hyperlipidemia:  -resume home meds   GERD:  -Continue PPIs  Anemia -unclear etiology -defer to outpatient for work up -transfuse for <7 MCV >100  DVT prophylaxis: enoxaparin (LOVENOX) injection 40 mg Start: 06/03/22 1000    Code Status: Full Code Family Communication: called daughter 6/20  Disposition Plan:  Level of care: Med-Surg Status is: Inpatient Remains inpatient appropriate because: back to SNF after echo/picc line and  arrangement of abx     Consultants:  ID podiatry   Subjective: No CP, no SOB  Objective: Vitals:   06/06/22 1533 06/06/22 2216 06/07/22 0552 06/07/22 1017  BP: (!) 120/51 (!) 109/38 124/62 (!) 114/58  Pulse: 72 76 75 77  Resp: 14 17 17 18   Temp: (!) 97.1 F (36.2 C) 98.2 F (36.8 C) (!) 97.2 F (36.2 C) 98 F (36.7 C)  TempSrc:  Oral Oral   SpO2: 100% 100% 99% 100%  Weight:      Height:        Intake/Output Summary (Last 24 hours) at 06/07/2022 1041 Last data filed at 06/07/2022 1017 Gross per 24 hour  Intake 800 ml  Output 1200 ml  Net -400 ml   Filed Weights   06/02/22 2244  Weight: 85.6 kg    Examination:    General: Appearance:    Obese female in no acute distress     Lungs:     respirations unlabored  Heart:    Normal heart rate.   MS:   Amputation of left foot is noted. wrapped  Neurologic:   Awake, alert, oriented x 3. No apparent focal neurological           defect.      Data Reviewed: I have personally reviewed following labs and imaging studies  CBC: Recent Labs  Lab 06/03/22 0012 06/05/22 0313 06/06/22 0315  WBC 2.3* 2.6* 3.4*  NEUTROABS 1.2*  --   --   HGB 7.9* 7.7* 7.8*  HCT 25.5* 24.9* 25.5*  MCV 104.5* 105.5* 103.2*  PLT 83* 75* 84*   Basic Metabolic Panel: Recent Labs  Lab 06/03/22 0012 06/05/22 0313 06/06/22 0315  NA 136 135 137  K 3.7 3.9 4.3  CL 108 111 111  CO2 20* 20* 22  GLUCOSE 197* 258* 236*  BUN 24* 20 23  CREATININE 0.96 0.84 0.87  CALCIUM 7.9* 8.0* 8.2*   GFR: Estimated Creatinine Clearance: 69.1 mL/min (by C-G formula based on SCr of 0.87 mg/dL). Liver Function Tests: Recent Labs  Lab 06/03/22 0012  AST 34  ALT 26  ALKPHOS 96  BILITOT 0.8  PROT 6.0*  ALBUMIN 2.4*   No results for input(s): "LIPASE", "AMYLASE" in the last 168 hours. No results for input(s): "AMMONIA" in the last 168 hours. Coagulation Profile: No results for input(s): "INR", "PROTIME" in the last 168 hours. Cardiac Enzymes: No  results for input(s): "CKTOTAL", "CKMB", "CKMBINDEX", "TROPONINI" in the last 168 hours. BNP (last 3 results) No results for input(s): "PROBNP" in the last 8760 hours. HbA1C: No results for input(s): "HGBA1C" in the last 72 hours. CBG: Recent Labs  Lab 06/06/22 0726 06/06/22 1224 06/06/22 1626 06/06/22 2212 06/07/22 0753  GLUCAP 161* 139* 177* 135* 134*   Lipid Profile: No results for input(s): "CHOL", "HDL", "LDLCALC", "TRIG", "CHOLHDL", "LDLDIRECT" in the last 72 hours. Thyroid Function Tests: No results for input(s): "TSH", "T4TOTAL", "FREET4", "T3FREE", "THYROIDAB" in the last 72 hours. Anemia Panel: Recent Labs    06/06/22 0315  VITAMINB12 600   Sepsis Labs: No results for input(s): "PROCALCITON", "LATICACIDVEN" in the last 168 hours.  Recent Results (from the past 240 hour(s))  MRSA Next Gen by PCR, Nasal     Status: Abnormal   Collection Time: 06/03/22  6:05 AM   Specimen: Nasal Mucosa; Nasal Swab  Result Value Ref Range Status   MRSA by PCR Next Gen DETECTED (A) NOT DETECTED Final    Comment: RESULT CALLED TO, READ BACK BY AND VERIFIED WITH: HALL, J. RN AT 0827 ON 06/03/2022 BY MECIAL J. (NOTE) The GeneXpert MRSA Assay (FDA approved for NASAL specimens only), is one component of a comprehensive MRSA colonization surveillance program. It is not intended to diagnose MRSA infection nor to guide or monitor treatment for MRSA infections. Test performance is not FDA approved in patients less than 15 years old. Performed at [facility], [address] [place], [place] [address]   Culture, blood (Routine X 2) w Reflex to ID Panel     Status: None (Preliminary result)   Collection Time: 06/03/22  8:58 AM   Specimen: BLOOD  Result Value Ref Range Status   Specimen Description   Final    BLOOD LEFT ANTECUBITAL Performed at [facility], [address] [place], [place] [address]    Special Requests   Final    IN PEDIATRIC  BOTTLE Blood Culture adequate volume Performed at [facility], [address], [place], [place] [address]    Culture   Final    NO GROWTH 4 DAYS Performed at [facility], [address], [place], [place] [address]    Report Status PENDING  Incomplete  Culture, blood (Routine X 2) w Reflex to ID Panel     Status: None (Preliminary result)   Collection Time: 06/03/22  8:58 AM   Specimen: BLOOD LEFT HAND  Result Value Ref Range Status   Specimen Description   Final    BLOOD LEFT HAND Performed at [facility],  [address] [place], [place] [address]    Special Requests   Final    IN PEDIATRIC BOTTLE Blood Culture adequate volume Performed at [facility], [address], [place], [place] [address]    Culture   Final    NO GROWTH 4 DAYS Performed at [facility], [address], [place], [place] [address]    Report Status PENDING  Incomplete  Aerobic/Anaerobic Culture w Gram Stain (surgical/deep wound)     Status: None (Preliminary result)   Collection Time: 06/04/22  9:57 AM   Specimen: Wound  Result Value Ref Range Status   Specimen Description   Final    WOUND BONE Performed at [facility], [address] [place], [place] [address]    Special Requests   Final    LACTOSE FERMENTER Performed at [facility], [address], [place], [place] [address]    Gram Stain   Final    NO SQUAMOUS EPITHELIAL CELLS SEEN RARE WBC SEEN NO ORGANISMS SEEN Performed at [facility], [address], [place], [place] [address]    Culture   Final    RARE METHICILLIN RESISTANT STAPHYLOCOCCUS AUREUS NO ANAEROBES ISOLATED; CULTURE IN PROGRESS FOR 5 DAYS    Report Status PENDING  Incomplete   Organism ID, Bacteria METHICILLIN RESISTANT STAPHYLOCOCCUS AUREUS  Final      Susceptibility   Methicillin resistant staphylococcus aureus - MIC*    CIPROFLOXACIN >=8 RESISTANT  Resistant     ERYTHROMYCIN >=8 RESISTANT Resistant     GENTAMICIN <=0.5 SENSITIVE Sensitive     OXACILLIN >=4 RESISTANT Resistant     TETRACYCLINE <=1 SENSITIVE Sensitive     VANCOMYCIN <=0.5 SENSITIVE Sensitive     TRIMETH/SULFA <=10 SENSITIVE Sensitive     CLINDAMYCIN >=8 RESISTANT Resistant     RIFAMPIN <=0.5 SENSITIVE Sensitive     Inducible Clindamycin NEGATIVE Sensitive     * RARE METHICILLIN RESISTANT STAPHYLOCOCCUS AUREUS         Radiology Studies: [place] EKG SITE RITE  Result Date: 06/07/2022 If Site Rite image not attached, placement could not be confirmed due to current cardiac rhythm.       Scheduled Meds:  acidophilus  1 capsule Oral Daily   diclofenac Sodium  2 g Topical QID   enoxaparin (LOVENOX) injection  40 mg Subcutaneous Q24H   folic acid  1 mg Oral BID   insulin aspart  0-20 Units Subcutaneous TID WC   insulin aspart  0-5 Units Subcutaneous QHS   insulin aspart  5 Units Subcutaneous TID WC   insulin glargine-yfgn  40 Units Subcutaneous Daily   mupirocin ointment  1 Application Nasal BID   pravastatin  20 mg Oral QHS   sertraline  200 mg Oral Daily   Continuous Infusions:  vancomycin 1,000 mg (06/07/22 0229)     LOS: 5 days    Time spent: 35 minutes spent on chart review, discussion with nursing staff, consultants, updating family and interview/physical exam; more than 50% of that time was spent in counseling and/or coordination of care.    Joseph Art, DO [facility] Available via Epic secure chat 7am-7pm After these hours, please refer to coverage provider listed on [email or web address] 06/07/2022, 10:41 AM

## 2022-06-07 NOTE — Progress Notes (Signed)
Subjective:  No new complaints   Antibiotics:  Anti-infectives (From admission, onward)    Start     Dose/Rate Route Frequency Ordered Stop   06/04/22 0200  vancomycin (VANCOCIN) IVPB 1000 mg/200 mL premix        1,000 mg 200 mL/hr over 60 Minutes Intravenous Every 24 hours 06/03/22 0050     06/03/22 0800  ceFEPIme (MAXIPIME) 2 g in sodium chloride 0.9 % 100 mL IVPB  Status:  Discontinued        2 g 200 mL/hr over 30 Minutes Intravenous Every 8 hours 06/03/22 0050 06/06/22 1136   06/03/22 0045  vancomycin (VANCOREADY) IVPB 1500 mg/300 mL        1,500 mg 150 mL/hr over 120 Minutes Intravenous  Once 06/02/22 2359 06/03/22 0318   06/03/22 0045  ceFEPIme (MAXIPIME) 2 g in sodium chloride 0.9 % 100 mL IVPB        2 g 200 mL/hr over 30 Minutes Intravenous  Once 06/02/22 2359 06/03/22 0050       Medications: Scheduled Meds:  acidophilus  1 capsule Oral Daily   diclofenac Sodium  2 g Topical QID   folic acid  1 mg Oral BID   insulin aspart  0-20 Units Subcutaneous TID WC   insulin aspart  0-5 Units Subcutaneous QHS   insulin aspart  5 Units Subcutaneous TID WC   insulin glargine-yfgn  40 Units Subcutaneous Daily   mupirocin ointment  1 Application Nasal BID   pravastatin  20 mg Oral QHS   sertraline  200 mg Oral Daily   sodium chloride flush  10-40 mL Intracatheter Q12H   Continuous Infusions:  vancomycin 1,000 mg (06/07/22 0229)   PRN Meds:.albuterol, camphor-menthol, diphenhydrAMINE, gabapentin, ketorolac, morphine injection, ondansetron **OR** ondansetron (ZOFRAN) IV, sodium chloride flush    Objective: Weight change:   Intake/Output Summary (Last 24 hours) at 06/07/2022 1621 Last data filed at 06/07/2022 1440 Gross per 24 hour  Intake 920 ml  Output 1350 ml  Net -430 ml    Blood pressure (!) 114/58, pulse 77, temperature 98 F (36.7 C), temperature source Oral, resp. rate 18, height $RemoveBe'5\' 6"'qZrxqKZVz$  (1.676 m), weight 85.6 kg, SpO2 100 %. Temp:  [97.2 F (36.2  C)-98.2 F (36.8 C)] 98 F (36.7 C) (06/20 1017) Pulse Rate:  [75-77] 77 (06/20 1017) Resp:  [17-18] 18 (06/20 1017) BP: (109-124)/(38-62) 114/58 (06/20 1017) SpO2:  [99 %-100 %] 100 % (06/20 1017)  Physical Exam: Physical Exam Constitutional:      General: She is not in acute distress.    Appearance: She is well-developed. She is not diaphoretic.  HENT:     Head: Normocephalic and atraumatic.     Right Ear: External ear normal.     Left Ear: External ear normal.     Mouth/Throat:     Pharynx: No oropharyngeal exudate.  Eyes:     General: No scleral icterus.    Conjunctiva/sclera: Conjunctivae normal.     Pupils: Pupils are equal, round, and reactive to light.  Cardiovascular:     Rate and Rhythm: Normal rate and regular rhythm.     Heart sounds: Normal heart sounds. No murmur heard.    No friction rub. No gallop.  Pulmonary:     Effort: Pulmonary effort is normal. No respiratory distress.     Breath sounds: Normal breath sounds. No stridor. No wheezing or rales.  Abdominal:     General: Bowel sounds are normal. There  is no distension.     Palpations: Abdomen is soft.     Tenderness: There is no abdominal tenderness. There is no rebound.  Musculoskeletal:        General: No tenderness. Normal range of motion.  Lymphadenopathy:     Cervical: No cervical adenopathy.  Skin:    General: Skin is warm and dry.     Coloration: Skin is not pale.     Findings: No erythema or rash.  Neurological:     General: No focal deficit present.     Mental Status: She is alert and oriented to person, place, and time.     Motor: No abnormal muscle tone.     Coordination: Coordination normal.  Psychiatric:        Mood and Affect: Mood normal.        Behavior: Behavior normal.        Thought Content: Thought content normal.        Judgment: Judgment normal.     Lower extremity bandaged  CBC:    BMET Recent Labs    06/05/22 0313 06/06/22 0315  NA 135 137  K 3.9 4.3  CL 111  111  CO2 20* 22  GLUCOSE 258* 236*  BUN 20 23  CREATININE 0.84 0.87  CALCIUM 8.0* 8.2*      Liver Panel  No results for input(s): "PROT", "ALBUMIN", "AST", "ALT", "ALKPHOS", "BILITOT", "BILIDIR", "IBILI" in the last 72 hours.     Sedimentation Rate No results for input(s): "ESRSEDRATE" in the last 72 hours. C-Reactive Protein No results for input(s): "CRP" in the last 72 hours.  Micro Results: Recent Results (from the past 720 hour(s))  MRSA Next Gen by PCR, Nasal     Status: Abnormal   Collection Time: 06/03/22  6:05 AM   Specimen: Nasal Mucosa; Nasal Swab  Result Value Ref Range Status   MRSA by PCR Next Gen DETECTED (A) NOT DETECTED Final    Comment: RESULT CALLED TO, READ BACK BY AND VERIFIED WITH: HALL, J. RN AT 7939 ON 06/03/2022 BY MECIAL J. (NOTE) The GeneXpert MRSA Assay (FDA approved for NASAL specimens only), is one component of a comprehensive MRSA colonization surveillance program. It is not intended to diagnose MRSA infection nor to guide or monitor treatment for MRSA infections. Test performance is not FDA approved in patients less than 48 years old. Performed at [facility], [place] [address] [place], [place] [address]   Culture, blood (Routine X 2) w Reflex to ID Panel     Status: None (Preliminary result)   Collection Time: 06/03/22  8:58 AM   Specimen: BLOOD  Result Value Ref Range Status   Specimen Description   Final    BLOOD LEFT ANTECUBITAL Performed at [facility] [address], [place], [place] [address]    Special Requests   Final    IN PEDIATRIC BOTTLE Blood Culture adequate volume Performed at [facility] [address], [place], [place] [address]    Culture   Final    NO GROWTH 4 DAYS Performed at [facility], [place] [address] [place], [place] [address]    Report Status PENDING  Incomplete  Culture, blood (Routine X 2) w Reflex to ID Panel     Status: None  (Preliminary result)   Collection Time: 06/03/22  8:58 AM   Specimen: BLOOD LEFT HAND  Result Value Ref Range Status   Specimen Description   Final  BLOOD LEFT HAND Performed at [facility] [address] [place], [place] [address]    Special Requests   Final    IN PEDIATRIC BOTTLE Blood Culture adequate volume Performed at [facility] [address] [place], [place] [address]    Culture   Final    NO GROWTH 4 DAYS Performed at [facility], [place] [address], [place], [place] [address]    Report Status PENDING  Incomplete  Aerobic/Anaerobic Culture w Gram Stain (surgical/deep wound)     Status: None (Preliminary result)   Collection Time: 06/04/22  9:57 AM   Specimen: Wound  Result Value Ref Range Status   Specimen Description   Final    WOUND BONE Performed at [facility] [address], [place], [place] [address]    Special Requests   Final    LACTOSE FERMENTER Performed at [facility] [address] [place], [place] [address]    Gram Stain   Final    NO SQUAMOUS EPITHELIAL CELLS SEEN RARE WBC SEEN NO ORGANISMS SEEN Performed at [facility], [place] [address], [place], [place] [address]    Culture   Final    RARE METHICILLIN RESISTANT STAPHYLOCOCCUS AUREUS NO ANAEROBES ISOLATED; CULTURE IN PROGRESS FOR 5 DAYS    Report Status PENDING  Incomplete   Organism ID, Bacteria METHICILLIN RESISTANT STAPHYLOCOCCUS AUREUS  Final      Susceptibility   Methicillin resistant staphylococcus aureus - MIC*    CIPROFLOXACIN >=8 RESISTANT Resistant     ERYTHROMYCIN >=8 RESISTANT Resistant     GENTAMICIN <=0.5 SENSITIVE Sensitive     OXACILLIN >=4 RESISTANT Resistant     TETRACYCLINE <=1 SENSITIVE Sensitive     VANCOMYCIN <=0.5 SENSITIVE Sensitive     TRIMETH/SULFA <=10 SENSITIVE Sensitive     CLINDAMYCIN >=8 RESISTANT Resistant     RIFAMPIN <=0.5 SENSITIVE Sensitive     Inducible  Clindamycin NEGATIVE Sensitive     * RARE METHICILLIN RESISTANT STAPHYLOCOCCUS AUREUS    Studies/Results: ECHOCARDIOGRAM COMPLETE  Result Date: 06/07/2022    ECHOCARDIOGRAM REPORT   Patient Name:   Ariel Arnold Date of Exam: 06/07/2022 Medical Rec #:  [id number]       Height:       66.0 in Accession #:    [id number]      Weight:       188.7 lb Date of Birth:  06/14/1956        BSA:          1.951 m Patient Age:    67 years        BP:           114/58 mmHg Patient Gender: F               HR:           78 bpm. Exam Location:  Inpatient Procedure: 2D Echo, Cardiac Doppler and Color Doppler Indications:    Bacteremia  History:        Patient has no prior history of Echocardiogram examinations.                 Risk Factors:Hypertension and Diabetes.  Sonographer:    Jefferey Pica Referring Phys: [id number] V[place] DAM IMPRESSIONS  1. Left ventricular ejection fraction, by estimation, is 60 to 65%. The left ventricle has normal function. The left ventricle has no regional wall motion abnormalities. Left ventricular diastolic parameters are consistent  with Grade II diastolic dysfunction (pseudonormalization).  2. Right ventricular systolic function is normal. The right ventricular size is normal. There is normal pulmonary artery systolic pressure. The estimated right ventricular systolic pressure is 49.1 mmHg.  3. Left atrial size was mildly dilated.  4. Mitral valve area (MVA) by 2D planimetry 3.29 cm2, MVA by continuity equation 2.23 cm2. The mitral valve is abnormal. No evidence of mitral valve regurgitation. Mild mitral stenosis. The mean mitral valve gradient is 5.7 mmHg with average heart rate of 75 bpm. Moderate mitral annular calcification.  5. The aortic valve is tricuspid. Aortic valve regurgitation is not visualized. No aortic stenosis is present. Comparison(s): No prior Echocardiogram. FINDINGS  Left Ventricle: Left ventricular ejection fraction, by estimation, is 60 to 65%. The left ventricle has  normal function. The left ventricle has no regional wall motion abnormalities. The left ventricular internal cavity size was normal in size. There is  no left ventricular hypertrophy. Left ventricular diastolic parameters are consistent with Grade II diastolic dysfunction (pseudonormalization). Right Ventricle: The right ventricular size is normal. No increase in right ventricular wall thickness. Right ventricular systolic function is normal. There is normal pulmonary artery systolic pressure. The tricuspid regurgitant velocity is 2.48 m/s, and  with an assumed right atrial pressure of 3 mmHg, the estimated right ventricular systolic pressure is 79.1 mmHg. Left Atrium: Left atrial size was mildly dilated. Right Atrium: Right atrial size was normal in size. Pericardium: There is no evidence of pericardial effusion. Mitral Valve: MAV by 2D planimetry 3.29 cm2., MVA by continuity equation 2.23 cm2. The mitral valve is abnormal. Moderate mitral annular calcification. No evidence of mitral valve regurgitation. Mild mitral valve stenosis. The mean mitral valve gradient is 5.7 mmHg with average heart rate of 75 bpm. Tricuspid Valve: The tricuspid valve is normal in structure. Tricuspid valve regurgitation is trivial. No evidence of tricuspid stenosis. Aortic Valve: The aortic valve is tricuspid. Aortic valve regurgitation is not visualized. No aortic stenosis is present. Aortic valve peak gradient measures 10.0 mmHg. Pulmonic Valve: The pulmonic valve was normal in structure. Pulmonic valve regurgitation is trivial. No evidence of pulmonic stenosis. Aorta: The aortic root and ascending aorta are structurally normal, with no evidence of dilitation. Pulmonary Artery: The pulmonary artery is of normal size. IAS/Shunts: No atrial level shunt detected by color flow Doppler.  LEFT VENTRICLE PLAX 2D LVIDd:         4.70 cm   Diastology LVIDs:         2.50 cm   LV e' medial:    4.65 cm/s LV PW:         0.90 cm   LV E/e' medial:   29.0 LV IVS:        0.90 cm   LV e' lateral:   8.70 cm/s LVOT diam:     2.00 cm   LV E/e' lateral: 15.5 LV SV:         101 LV SV Index:   52 LVOT Area:     3.14 cm  RIGHT VENTRICLE             IVC RV Basal diam:  2.70 cm     IVC diam: 1.70 cm RV S prime:     15.30 cm/s TAPSE (M-mode): 2.5 cm LEFT ATRIUM             Index        RIGHT ATRIUM           Index LA diam:  4.50 cm 2.31 cm/m   RA Area:     11.40 cm LA Vol (A2C):   75.2 ml 38.54 ml/m  RA Volume:   21.10 ml  10.81 ml/m LA Vol (A4C):   66.8 ml 34.24 ml/m LA Biplane Vol: 74.0 ml 37.93 ml/m  AORTIC VALVE                 PULMONIC VALVE AV Area (Vmax): 2.74 cm     PV Vmax:       1.15 m/s AV Vmax:        158.00 cm/s  PV Peak grad:  5.3 mmHg AV Peak Grad:   10.0 mmHg LVOT Vmax:      138.00 cm/s LVOT Vmean:     89.900 cm/s LVOT VTI:       0.320 m  AORTA Ao Root diam: 2.90 cm Ao Asc diam:  3.70 cm MITRAL VALVE                TRICUSPID VALVE MV Area (PHT): 3.42 cm     TR Peak grad:   24.6 mmHg MV Mean grad:  5.7 mmHg     TR Vmax:        248.00 cm/s MV Decel Time: 222 msec MV E velocity: 135.00 cm/s  SHUNTS MV A velocity: 111.00 cm/s  Systemic VTI:  0.32 m MV E/A ratio:  1.22         Systemic Diam: 2.00 cm Rudean Haskell MD Electronically signed by Rudean Haskell MD Signature Date/Time: 06/07/2022/12:49:08 PM    Final    [place] EKG SITE RITE  Result Date: 06/07/2022 If Site Rite image not attached, placement could not be confirmed due to current cardiac rhythm.     Assessment/Plan:  INTERVAL HISTORY:   2D echocardiogram without overt vegetations  Principal Problem:   MRSA bacteremia Active Problems:   Liver cirrhosis secondary to NASH (HCC)   Acquired absence of left foot (HCC)   Anemia in chronic kidney disease   Depressive disorder   Mixed hyperlipidemia   Non-healing wound of amputation stump (HCC)   Stage 2 chronic kidney disease due to type 2 diabetes mellitus ([place])   Type 2 diabetes mellitus treated with insulin  ([place])   Essential hypertension   Cellulitis of left foot   Secondary esophageal varices with bleeding ([place])    Ariel Arnold is a 66 y.o. female with history of poorly controlled diabetes mellitus hypertension NASH with chronic pancytopenia, esophageal varices who presented [facility] emergency department with left foot drainage at site of prior amputation.  She had undergone left first metatarsal partial amputation November 2022 but had remaining infected bone and was treated for MRSA infection with daptomycin for 6 weeks.  She did not present for outpatient follow-up with infectious disease but returned to podiatry in January 18, 2022 with an MRI that the February 2023 showed ongoing osteomyelitis she was then lost to follow-up and presented emergency department in April and underwent transmetatarsal amputation.  She then again had missed follow-up appointments but ultimately sent to the ER in [place] where blood cultures were taken which are now growing MRSA from 2 of 2 sites.  She was started on appropriate antibiotics in [place] and transferred here.  MRI that was done in [place] was also repeated here.  She has now undergone revision of her transmetatarsal amputation.  Podiatry felt good about what they have achieved surgically and we are recommending oral antibiotics however given the fact the patient has known MRSA  bacteremia she is going to need parenteral antibiotics.    #1 MRSA bacteremia undoubtedly due to her infected foot at site of transmetatarsal amputation.  .  Echocardiogram is negative for overt vegetations and she would not be a candidate for TEE.  Her blood cultures so far no growth to date.  I personally would have preferred to have blood cultures with no growth and final prior to placement of PICC line though I would expect that by now today when her PICC line was placed that her blood would be sterile.  I will plan on giving her 6 weeks of IV  vancomycin  Diagnosis: MRSA bacteremia osteomyelitis, rule out endocarditis  Culture Result: MRSA from blood cultures in [place] and MRSA from bone culture  Allergies  Allergen Reactions   Chlorhexidine Rash   Tape Rash    OPAT Orders Discharge antibiotics to be given via PICC line Discharge antibiotics: Per pharmacy protocol vancomycin Aim for Vancomycin trough 15-20 or AUC 400-550 (unless otherwise indicated) Duration: 6 weeks End Date: July 13, 2022  [facility] Per Protocol:  Home health RN for IV administration and teaching; PICC line care and labs.    Labs BI weekly while on IV antibiotics:  _x_ BMP w GFR  _x_ Vancomycin trough   weekly while on IV antibiotics:  x__ CBC with differential  _x_ CRP _x_ ESR  _x_ Please pull PIC at completion of IV antibiotics __ Please leave PIC in place until doctor has seen patient or been notified  Fax weekly labs to [phone]  Clinic Follow Up Appt:   MARSHEILA ALEJO has an appointment on Arnold with Terri Piedra, NP @  The [facility] is located in the [facility] at  [place] in [place].  [address], which is located to the left of the elevators.  Phone: [phone]  Fax: [phone]  [email or web address]  She should arrive 30 minutes prior to her appointment.  I spent 36 minutes with the patient including than 50% of the time in face to face counseling of the patient regarding reasons for treatment duration to treat empirically for possible endocarditis which also provide a regimen for potential residual osteomyelitis personally reviewing transthoracic echocardiogram and updated cultures along with review of medical records in preparation for the visit and during the visit and in coordination of her care.  I will sign off for now please call with further questions.    LOS: 5 days   Alcide Evener 06/07/2022, 4:21 PM

## 2022-06-07 NOTE — TOC Progression Note (Signed)
Transition of Care [facility]) - Progression Note   Patient Details  Name: Ariel Arnold MRN: [id number] Date of Birth: 03-28-1956  [facility] CM/SW Contact  Ewing Schlein, LCSW Phone Number: 06/07/2022, 12:46 PM  Clinical Narrative: Patient will need to discharge to SNF on 6 weeks IV antibiotics with an end date of 07/13/22. CSW spoke with Lurena Joiner in admissions at [facility] and confirmed patient can be admitted on IV antibiotics tomorrow pending insurance authorization. CSW started insurance authorization on [employer] portal and clinicals were uploaded for review. Reference ID is: [id number]. TOC awaiting insurance authorization.  Expected Discharge Plan: Skilled Nursing Facility Barriers to Discharge: Continued Medical Work up, English as a second language teacher  Expected Discharge Plan and Services Expected Discharge Plan: Skilled Nursing Facility In-house Referral: Clinical Social Work Discharge Planning Services: NA Post Acute Care Choice: Skilled Nursing Facility Living arrangements for the past 2 months: Skilled Nursing Facility              DME Arranged: N/A DME Agency: NA  Readmission Risk Interventions    11/16/2021    9:27 AM  Readmission Risk Prevention Plan  Home Care Screening Complete  Medication Review (RN CM) Complete

## 2022-06-07 NOTE — Progress Notes (Signed)
Peripherally Inserted Central Catheter Placement  The IV Nurse has discussed with the patient and/or persons authorized to consent for the patient, the purpose of this procedure and the potential benefits and risks involved with this procedure.  The benefits include less needle sticks, lab draws from the catheter, and the patient may be discharged home with the catheter. Risks include, but not limited to, infection, bleeding, blood clot (thrombus formation), and puncture of an artery; nerve damage and irregular heartbeat and possibility to perform a PICC exchange if needed/ordered by physician.  Alternatives to this procedure were also discussed.  Bard Power PICC patient education guide, fact sheet on infection prevention and patient information card has been provided to patient /or left at bedside.    PICC Placement Documentation  PICC Single Lumen 06/07/22 Right Brachial 40 cm 1 cm (Active)  Indication for Insertion or Continuance of Line Prolonged intravenous therapies 06/07/22 1233  Exposed Catheter (cm) 1 cm 06/07/22 1233  Site Assessment Clean, Dry, Intact 06/07/22 1233  Line Status Flushed;Saline locked;Blood return noted 06/07/22 1233  Dressing Type Transparent;Securing device 06/07/22 1233  Dressing Status Antimicrobial disc in place;Clean, Dry, Intact 06/07/22 1233  Safety Lock Not Applicable 06/07/22 1233  Dressing Intervention New dressing;Other (Comment) 06/07/22 1233  Dressing Change Due 06/14/22 06/07/22 1233       Annett Fabian 06/07/2022, 12:34 PM

## 2022-06-08 DIAGNOSIS — L03116 Cellulitis of left lower limb: Secondary | ICD-10-CM | POA: Diagnosis not present

## 2022-06-08 DIAGNOSIS — I10 Essential (primary) hypertension: Secondary | ICD-10-CM | POA: Diagnosis not present

## 2022-06-08 DIAGNOSIS — R7881 Bacteremia: Secondary | ICD-10-CM | POA: Diagnosis not present

## 2022-06-08 DIAGNOSIS — K7581 Nonalcoholic steatohepatitis (NASH): Secondary | ICD-10-CM | POA: Diagnosis not present

## 2022-06-08 LAB — CULTURE, BLOOD (ROUTINE X 2)
Culture: NO GROWTH
Culture: NO GROWTH
Special Requests: ADEQUATE
Special Requests: ADEQUATE

## 2022-06-08 LAB — BASIC METABOLIC PANEL
Anion gap: 2 — ABNORMAL LOW (ref 5–15)
BUN: 26 mg/dL — ABNORMAL HIGH (ref 8–23)
CO2: 23 mmol/L (ref 22–32)
Calcium: 8.1 mg/dL — ABNORMAL LOW (ref 8.9–10.3)
Chloride: 112 mmol/L — ABNORMAL HIGH (ref 98–111)
Creatinine, Ser: 0.84 mg/dL (ref 0.44–1.00)
GFR, Estimated: >60 mL/min (ref >60)
Glucose, Bld: 109 mg/dL — ABNORMAL HIGH (ref 70–99)
Potassium: 3.7 mmol/L (ref 3.5–5.1)
Sodium: 137 mmol/L (ref 135–145)

## 2022-06-08 LAB — GLUCOSE, CAPILLARY
Glucose-Capillary: 143 mg/dL — ABNORMAL HIGH (ref 70–99)
Glucose-Capillary: 159 mg/dL — ABNORMAL HIGH (ref 70–99)

## 2022-06-08 LAB — VANCOMYCIN, PEAK: Vancomycin Pk: 37 ug/mL (ref 30–40)

## 2022-06-08 MED ORDER — SODIUM CHLORIDE 0.9 % IV SOLN: INTRAVENOUS | Status: DC | PRN

## 2022-06-08 MED ORDER — SERTRALINE HCL 100 MG PO TABS: 200.0000 mg | ORAL_TABLET | Freq: Every day | ORAL | 0 refills | Status: AC

## 2022-06-08 MED ORDER — VANCOMYCIN IV (FOR PTA / DISCHARGE USE ONLY): 1000.0000 mg | INTRAVENOUS | 0 refills | Status: AC

## 2022-06-08 MED ORDER — HEPARIN SOD (PORK) LOCK FLUSH 100 UNIT/ML IV SOLN
250.0000 [IU] | INTRAVENOUS | Status: AC | PRN
  Administered 2022-06-08: 250 [IU]
  Filled 2022-06-08: qty 2.5

## 2022-06-08 NOTE — Plan of Care (Signed)
  Problem: Education: Goal: Knowledge of General Education information will improve Description: Including pain rating scale, medication(s)/side effects and non-pharmacologic comfort measures Outcome: Progressing   Problem: Activity: Goal: Risk for activity intolerance will decrease Outcome: Progressing   Problem: Pain Managment: Goal: General experience of comfort will improve Outcome: Progressing   

## 2022-06-08 NOTE — Discharge Summary (Signed)
Physician Discharge Summary  OLIVETTE BECKMANN VVO:[id number] DOB: 06-30-1956 DOA: 06/02/2022  PCP: Jacqualine Code, DO  Admit date: 06/02/2022 Discharge date: 06/08/2022  Admitted From: Home Disposition: Skilled nursing facility  Recommendations for Outpatient Follow-up:  Follow up with PCP in 1-2 weeks Please obtain BMP/CBC in one week Follow-up with podiatry, Dr. Blenda Mounts will be scheduled Dressing over foot to stay in place until podiatry follow-up Nonweightbearing left foot She is to continue on IV antibiotics through July 13, 2022, after which PICC line can be discontinued Follow-up with infectious disease clinic scheduled for 07/05/2022   Discharge Condition: Stable CODE STATUS: Full code Diet recommendation: Heart healthy, carb modified  Brief/Interim Summary: NIDYA BOUYER is a 66 y.o. female with medical history significant of HTN type II DM, PVD, [name] leading to liver cirrhosis, chronic pancytopenia, HLD, depression presented to the ER in [place] on 06/01/2022, with complaints of confusion and fever as well as foul-smelling discharge coming from left foot amputation site.  Found to have osteomyelitis and gram + bacteremia at OSH (MRSA on  culture) PICC line ordered and plan is for 6 weeks of abx  Discharge Diagnoses:  Principal Problem:   MRSA bacteremia Active Problems:   Liver cirrhosis secondary to NASH ([place])   Acquired absence of left foot (HCC)   Anemia in chronic kidney disease   Depressive disorder   Mixed hyperlipidemia   Non-healing wound of amputation stump (HCC)   Stage 2 chronic kidney disease due to type 2 diabetes mellitus ([place])   Type 2 diabetes mellitus treated with insulin ([place])   Essential hypertension   Cellulitis of left foot   Secondary esophageal varices with bleeding (HCC)  Gram + bacteremia(MRSA)/left foot wound infection:  - IV Vanco  - MRI of the left foot shows osteo - podiatry consult: s/p surgery on 6/17 -cultures negative at [facility]  but MRSA at [facility] -ID consult appreciated -2D echo without any signs of vegetations -plan for 6 weeks of abx -Follow-up with podiatry for further wound management -Follow-up with infectious disease has been scheduled   NASH cirrhosis:  -Patient apparently had hepatic encephalopathy which has resolved.  Continue monitoring -Continue Lasix and Aldactone as needed for edema   essential hypertension:  -Resume Coreg on discharge   type 2 diabetes:  -Hemoglobin A1c more than 11.   -Continue with sliding scale insulin -Continue basal insulin with sliding scale   chronic kidney disease stage II:  -continue to monitor   Pancytopenia -prob from underlying cirrhosis -outpatient follow up   Obesity Estimated body mass index is 30.46 kg/m as calculated from the following:   Height as of this encounter: $RemoveBeforeD'5\' 6"'mbGHeMQpOKusVj$  (1.676 m).   Weight as of this encounter: 85.6 kg.    mixed hyperlipidemia:  -resume home meds   GERD:  -Continue PPIs   Anemia -unclear etiology -defer to outpatient for work up -transfuse for <7 MCV >100  Discharge Instructions  Discharge Instructions     [facility] pharmacist to adjust dose for Vancomycin, Aminoglycosides and other anti-infective therapies as requested by physician.   Complete by: As directed    [facility] to provide Cath Flo $Remove'2mg'QkUzjPT$    Complete by: As directed    Administer for PICC line occlusion and as ordered by physician for other access device issues.   Anaphylaxis Kit: Provided to treat any anaphylactic reaction to the medication being provided to the patient if First Dose or when requested by physician   Complete by: As directed  Epinephrine 1mg /ml vial / amp: Administer 0.3mg  (0.22ml) subcutaneously once for moderate to severe anaphylaxis, nurse to call physician and pharmacy when reaction occurs and call 911 if needed for immediate care   Diphenhydramine 50mg /ml IV vial: Administer 25-50mg  IV/IM PRN for first dose  reaction, rash, itching, mild reaction, nurse to call physician and pharmacy when reaction occurs   Sodium Chloride 0.9% NS 513ml IV: Administer if needed for hypovolemic blood pressure drop or as ordered by physician after call to physician with anaphylactic reaction   Change dressing on IV access line weekly and PRN   Complete by: As directed    Diet - low sodium heart healthy   Complete by: As directed    Flush IV access with Sodium Chloride 0.9% and Heparin 10 units/ml or 100 units/ml   Complete by: As directed    Home infusion instructions - [facility]   Complete by: As directed    Instructions: Flush IV access with Sodium Chloride 0.9% and Heparin 10units/ml or 100units/ml   Change dressing on IV access line: Weekly and PRN   Instructions Cath Flo 2mg : Administer for PICC Line occlusion and as ordered by physician for other access device   [facility] pharmacist to adjust dose for: Vancomycin, Aminoglycosides and other anti-infective therapies as requested by physician   Increase activity slowly   Complete by: As directed    Method of administration may be changed at the discretion of home infusion pharmacist based upon assessment of the patient and/or caregiver's ability to self-administer the medication ordered   Complete by: As directed    No wound care   Complete by: As directed       Allergies as of 06/08/2022       Reactions   Chlorhexidine Rash   Tape Rash        Medication List     TAKE these medications    Accu-Chek Guide test strip Generic drug: glucose blood   Accu-Chek Guide w/Device Kit   Accu-Chek Softclix Lancets lancets SMARTSIG:2 Topical Twice Daily   albuterol 108 (90 Base) MCG/ACT inhaler Commonly known as: VENTOLIN HFA Inhale 2 puffs into the lungs every 4 (four) hours as needed for shortness of breath or wheezing.   Calcium 1200 1200-1000 MG-UNIT Chew Chew 2 tablets by mouth daily.   carvedilol 6.25 MG  tablet Commonly known as: COREG Take 1 tablet (6.25 mg total) by mouth 2 (two) times daily.   clotrimazole-betamethasone cream Commonly known as: LOTRISONE Apply topically 2 (two) times daily.   diclofenac Sodium 1 % Gel Commonly known as: VOLTAREN Apply 2 g topically in the morning and at bedtime.   ferrous sulfate 325 (65 FE) MG tablet Take 325 mg by mouth in the morning and at bedtime.   folic acid 1 MG tablet Commonly known as: FOLVITE Take 1 mg by mouth 2 (two) times daily.   furosemide 20 MG tablet Commonly known as: LASIX Take 20 mg by mouth daily as needed for fluid.   gabapentin 600 MG tablet Commonly known as: NEURONTIN Take 600 mg by mouth 3 (three) times daily as needed (nerve pain).   Global Ease Inject Pen Needles 32G X 4 MM Misc Generic drug: Insulin Pen Needle   HumaLOG KwikPen 100 UNIT/ML KwikPen Generic drug: insulin lispro Inject 6 Units into the skin in the morning, at noon, in the evening, and at bedtime.   ketorolac 0.5 % ophthalmic solution Commonly known as: ACULAR Place 1 drop into both eyes  4 (four) times daily as needed (dry eyes).   pantoprazole 40 MG tablet Commonly known as: PROTONIX Take 40 mg by mouth daily as needed (heartburn).   pravastatin 20 MG tablet Commonly known as: PRAVACHOL Take 1 tablet (20 mg total) by mouth at bedtime. Take after completion of daptomycin   sertraline 100 MG tablet Commonly known as: ZOLOFT Take 2 tablets (200 mg total) by mouth daily.   spironolactone 50 MG tablet Commonly known as: ALDACTONE Take 50 mg by mouth daily as needed (swelling).   Toujeo Max SoloStar 300 UNIT/ML Solostar Pen Generic drug: insulin glargine (2 Unit Dial) Inject 60 Units into the skin at bedtime.   vancomycin  IVPB Inject 1,000 mg into the vein daily. Indication:  MRSA bacteremia First Dose: Yes Last Day of Therapy:  07/13/2022 Labs - $Remo"Sunday/Monday:  CBC/D, BMP, and vancomycin trough. Labs - Thursday:  BMP and  vancomycin trough Labs - Every other week:  ESR and CRP Method of administration:Elastomeric Method of administration may be changed at the discretion of the patient and/or caregiver's ability to self-administer the medication ordered.   vitamin C 500 MG tablet Commonly known as: ASCORBIC ACID Take 500 mg by mouth 2 (two) times daily.   Vitamin D (Ergocalciferol) 1.25 MG (50000 UNIT) Caps capsule Commonly known as: DRISDOL Take 1 capsule by mouth every 7 (seven) days. Mondays               Discharge Care Instructions  (From admission, onward)           Start     Ordered   06/08/22 0000  Change dressing on IV access line weekly and PRN  (Home infusion instructions - [facility] )        06/08/22 1334            Contact information for after-discharge care     Destination     [facility] .   Service: Skilled Nursing Contact information: [address] [place] [address] [phone]                    Allergies  Allergen Reactions   Chlorhexidine Rash   Tape Rash    Consultations: Podiatry Infectious disease   Procedures/Studies: ECHOCARDIOGRAM COMPLETE  Result Date: 06/07/2022    ECHOCARDIOGRAM REPORT   Patient Name:   Temitope S Schnurr Date of Exam: 06/07/2022 Medical Rec #:  [id number]       Height:       66.0 in Accession #:    [id number]      Weight:       188.7 lb Date of Birth:  02/14/1956        BSA:          1.951 m Patient Age:    65 years        BP:           114/58 mmHg Patient Gender: F               HR:           78"Lqhgj$  bpm. Exam Location:  Inpatient Procedure: 2D Echo, Cardiac Doppler and Color Doppler Indications:    Bacteremia  History:        Patient has no prior history of Echocardiogram examinations.                 Risk Factors:Hypertension and Diabetes.  Sonographer:    Jefferey Pica Referring Phys: [place]  N VAN DAM IMPRESSIONS  1. Left ventricular ejection fraction, by  estimation, is 60 to 65%. The left ventricle has normal function. The left ventricle has no regional wall motion abnormalities. Left ventricular diastolic parameters are consistent with Grade II diastolic dysfunction (pseudonormalization).  2. Right ventricular systolic function is normal. The right ventricular size is normal. There is normal pulmonary artery systolic pressure. The estimated right ventricular systolic pressure is 58.3 mmHg.  3. Left atrial size was mildly dilated.  4. Mitral valve area (MVA) by 2D planimetry 3.29 cm2, MVA by continuity equation 2.23 cm2. The mitral valve is abnormal. No evidence of mitral valve regurgitation. Mild mitral stenosis. The mean mitral valve gradient is 5.7 mmHg with average heart rate of 75 bpm. Moderate mitral annular calcification.  5. The aortic valve is tricuspid. Aortic valve regurgitation is not visualized. No aortic stenosis is present. Comparison(s): No prior Echocardiogram. FINDINGS  Left Ventricle: Left ventricular ejection fraction, by estimation, is 60 to 65%. The left ventricle has normal function. The left ventricle has no regional wall motion abnormalities. The left ventricular internal cavity size was normal in size. There is  no left ventricular hypertrophy. Left ventricular diastolic parameters are consistent with Grade II diastolic dysfunction (pseudonormalization). Right Ventricle: The right ventricular size is normal. No increase in right ventricular wall thickness. Right ventricular systolic function is normal. There is normal pulmonary artery systolic pressure. The tricuspid regurgitant velocity is 2.48 m/s, and  with an assumed right atrial pressure of 3 mmHg, the estimated right ventricular systolic pressure is 09.4 mmHg. Left Atrium: Left atrial size was mildly dilated. Right Atrium: Right atrial size was normal in size. Pericardium: There is no evidence of pericardial effusion. Mitral Valve: MAV by 2D planimetry 3.29 cm2., MVA by continuity  equation 2.23 cm2. The mitral valve is abnormal. Moderate mitral annular calcification. No evidence of mitral valve regurgitation. Mild mitral valve stenosis. The mean mitral valve gradient is 5.7 mmHg with average heart rate of 75 bpm. Tricuspid Valve: The tricuspid valve is normal in structure. Tricuspid valve regurgitation is trivial. No evidence of tricuspid stenosis. Aortic Valve: The aortic valve is tricuspid. Aortic valve regurgitation is not visualized. No aortic stenosis is present. Aortic valve peak gradient measures 10.0 mmHg. Pulmonic Valve: The pulmonic valve was normal in structure. Pulmonic valve regurgitation is trivial. No evidence of pulmonic stenosis. Aorta: The aortic root and ascending aorta are structurally normal, with no evidence of dilitation. Pulmonary Artery: The pulmonary artery is of normal size. IAS/Shunts: No atrial level shunt detected by color flow Doppler.  LEFT VENTRICLE PLAX 2D LVIDd:         4.70 cm   Diastology LVIDs:         2.50 cm   LV e' medial:    4.65 cm/s LV PW:         0.90 cm   LV E/e' medial:  29.0 LV IVS:        0.90 cm   LV e' lateral:   8.70 cm/s LVOT diam:     2.00 cm   LV E/e' lateral: 15.5 LV SV:         101 LV SV Index:   52 LVOT Area:     3.14 cm  RIGHT VENTRICLE             IVC RV Basal diam:  2.70 cm     IVC diam: 1.70 cm RV S prime:     15.30 cm/s TAPSE (M-mode): 2.5 cm LEFT  ATRIUM             Index        RIGHT ATRIUM           Index LA diam:        4.50 cm 2.31 cm/m   RA Area:     11.40 cm LA Vol (A2C):   75.2 ml 38.54 ml/m  RA Volume:   21.10 ml  10.81 ml/m LA Vol (A4C):   66.8 ml 34.24 ml/m LA Biplane Vol: 74.0 ml 37.93 ml/m  AORTIC VALVE                 PULMONIC VALVE AV Area (Vmax): 2.74 cm     PV Vmax:       1.15 m/s AV Vmax:        158.00 cm/s  PV Peak grad:  5.3 mmHg AV Peak Grad:   10.0 mmHg LVOT Vmax:      138.00 cm/s LVOT Vmean:     89.900 cm/s LVOT VTI:       0.320 m  AORTA Ao Root diam: 2.90 cm Ao Asc diam:  3.70 cm MITRAL VALVE                 TRICUSPID VALVE MV Area (PHT): 3.42 cm     TR Peak grad:   24.6 mmHg MV Mean grad:  5.7 mmHg     TR Vmax:        248.00 cm/s MV Decel Time: 222 msec MV E velocity: 135.00 cm/s  SHUNTS MV A velocity: 111.00 cm/s  Systemic VTI:  0.32 m MV E/A ratio:  1.22         Systemic Diam: 2.00 cm Rudean Haskell MD Electronically signed by Rudean Haskell MD Signature Date/Time: 06/07/2022/12:49:08 PM    Final    [place] EKG SITE RITE  Result Date: 06/07/2022 If Site Rite image not attached, placement could not be confirmed due to current cardiac rhythm.  DG Foot Complete Left  Result Date: 06/04/2022 CLINICAL DATA:  Transmetatarsal amputation revision today. EXAM: LEFT FOOT - COMPLETE 3+ VIEW COMPARISON:  04/05/2022 FINDINGS: Examination demonstrates evidence of patient's recent transmetatarsal amputation of toes 1 through 5. Evidence of interval revision of the fifth transmetatarsal amputation site. Skin staples over the distal soft tissues. Exam is otherwise unchanged. IMPRESSION: Evidence of patient's recent transmetatarsal amputation of toes 1 through 5 with interval revision of the fifth transmetatarsal amputation site. Electronically Signed   By: Marin Olp M.D.   On: 06/04/2022 10:57   MR FOOT LEFT W WO CONTRAST  Result Date: 06/03/2022 CLINICAL DATA:  Osteomyelitis, foot Osteomyelitis left foot EXAM: MRI OF THE LEFT FOREFOOT WITHOUT AND WITH CONTRAST TECHNIQUE: Multiplanar, multisequence MR imaging of the left foot was performed both before and after administration of intravenous contrast. CONTRAST:  67mL GADAVIST GADOBUTROL 1 MMOL/ML IV SOLN COMPARISON:  X-ray 04/05/2022, MRI 04/03/2022 FINDINGS: Bones/Joint/Cartilage Prior transmetatarsal amputation of the left foot. Abnormal bone marrow edema and confluent low T1 signal changes throughout the residual fifth metatarsal compatible with acute osteomyelitis. Additionally, there is bone marrow edema within the residual first through fourth  metatarsals with slight irregularity along the resection margins suggestive of early acute osteomyelitis. No acute fracture or dislocation. Mild patchy marrow edema within the midfoot is less prominent compared to prior, and likely degenerative or stress related. No significant effusion. Ligaments Intact Lisfranc ligament. Muscles and Tendons Post amputation changes of the distal forefoot. Chronic denervation changes. Soft tissues Skin thickening  and soft tissue swelling at the distal stump. Small rim enhancing abscess along the dorsal-medial margin of the residual fifth metatarsal diaphysis measuring 11 x 7 x 10 mm. Numerous foci of susceptibility artifact along the distal stump may be postsurgical. Soft tissue air not excluded. IMPRESSION: 1. Acute osteomyelitis throughout the residual fifth metatarsal. 2. Additional findings of early acute osteomyelitis involving the residual first through fourth metatarsals. 3. Small abscess adjacent to the residual fifth metatarsal measuring up to 1.1 cm. Electronically Signed   By: Davina Poke D.O.   On: 06/03/2022 17:01      Subjective: No new complaints  Discharge Exam: Vitals:   06/07/22 1649 06/07/22 2129 06/08/22 0625 06/08/22 1021  BP: (!) 110/94 (!) 119/49 (!) 112/52 140/60  Pulse: 82 82 77 87  Resp: $Remo'17 17 17 18  'BiYLE$ Temp: 97.8 F (36.6 C) 98.3 F (36.8 C) 98.1 F (36.7 C) 98.2 F (36.8 C)  TempSrc:  Oral Oral   SpO2: 100% 98% 99% 97%  Weight:      Height:        General: Pt is alert, awake, not in acute distress Cardiovascular: RRR, S1/S2 +, no rubs, no gallops Respiratory: CTA bilaterally, no wheezing, no rhonchi Abdominal: Soft, NT, ND, bowel sounds + Extremities: no edema, no cyanosis    The results of significant diagnostics from this hospitalization (including imaging, microbiology, ancillary and laboratory) are listed below for reference.     Microbiology: Recent Results (from the past 240 hour(s))  MRSA Next Gen by PCR,  Nasal     Status: Abnormal   Collection Time: 06/03/22  6:05 AM   Specimen: Nasal Mucosa; Nasal Swab  Result Value Ref Range Status   MRSA by PCR Next Gen DETECTED (A) NOT DETECTED Final    Comment: RESULT CALLED TO, READ BACK BY AND VERIFIED WITH: HALL, J. RN AT 4431 ON 06/03/2022 BY MECIAL J. (NOTE) The GeneXpert MRSA Assay (FDA approved for NASAL specimens only), is one component of a comprehensive MRSA colonization surveillance program. It is not intended to diagnose MRSA infection nor to guide or monitor treatment for MRSA infections. Test performance is not FDA approved in patients less than 56 years old. Performed at [facility], [place] [address] [place], [place] [address]   Culture, blood (Routine X 2) w Reflex to ID Panel     Status: None   Collection Time: 06/03/22  8:58 AM   Specimen: BLOOD  Result Value Ref Range Status   Specimen Description   Final    BLOOD LEFT ANTECUBITAL Performed at [facility] [address] [place], [place] [address]    Special Requests   Final    IN PEDIATRIC BOTTLE Blood Culture adequate volume Performed at [facility] [address], [place], [place] [address]    Culture   Final    NO GROWTH 5 DAYS Performed at [facility], [place] [address], [place], [place] [address]    Report Status 06/08/2022 FINAL  Final  Culture, blood (Routine X 2) w Reflex to ID Panel     Status: None   Collection Time: 06/03/22  8:58 AM   Specimen: BLOOD LEFT HAND  Result Value Ref Range Status   Specimen Description   Final    BLOOD LEFT HAND Performed at [facility] [address], [place], [place] [address]    Special Requests   Final    IN PEDIATRIC BOTTLE Blood Culture adequate volume  Performed at [facility], [place] [address] [place], [place] [address]    Culture   Final    NO GROWTH 5 DAYS Performed at [facility], [place] [address],  [place], [place] [address]    Report Status 06/08/2022 FINAL  Final  Aerobic/Anaerobic Culture w Gram Stain (surgical/deep wound)     Status: None (Preliminary result)   Collection Time: 06/04/22  9:57 AM   Specimen: Wound  Result Value Ref Range Status   Specimen Description   Final    WOUND BONE Performed at [facility] [address] [place], [place] [address]    Special Requests   Final    LACTOSE FERMENTER Performed at [facility] [address], [place], [place] [address]    Gram Stain   Final    NO SQUAMOUS EPITHELIAL CELLS SEEN RARE WBC SEEN NO ORGANISMS SEEN Performed at [facility], [place] [address], [place], [place] [address]    Culture   Final    RARE METHICILLIN RESISTANT STAPHYLOCOCCUS AUREUS NO ANAEROBES ISOLATED; CULTURE IN PROGRESS FOR 5 DAYS    Report Status PENDING  Incomplete   Organism ID, Bacteria METHICILLIN RESISTANT STAPHYLOCOCCUS AUREUS  Final      Susceptibility   Methicillin resistant staphylococcus aureus - MIC*    CIPROFLOXACIN >=8 RESISTANT Resistant     ERYTHROMYCIN >=8 RESISTANT Resistant     GENTAMICIN <=0.5 SENSITIVE Sensitive     OXACILLIN >=4 RESISTANT Resistant     TETRACYCLINE <=1 SENSITIVE Sensitive     VANCOMYCIN <=0.5 SENSITIVE Sensitive     TRIMETH/SULFA <=10 SENSITIVE Sensitive     CLINDAMYCIN >=8 RESISTANT Resistant     RIFAMPIN <=0.5 SENSITIVE Sensitive     Inducible Clindamycin NEGATIVE Sensitive     * RARE METHICILLIN RESISTANT STAPHYLOCOCCUS AUREUS     Labs: BNP (last 3 results) No results for input(s): "BNP" in the last 8760 hours. Basic Metabolic Panel: Recent Labs  Lab 06/03/22 0012 06/05/22 0313 06/06/22 0315 06/08/22 0400  NA 136 135 137 137  K 3.7 3.9 4.3 3.7  CL 108 111 111 112*  CO2 20* 20* 22 23  GLUCOSE 197* 258* 236* 109*  BUN 24* 20 23 26*  CREATININE 0.96 0.84 0.87 0.84  CALCIUM 7.9* 8.0* 8.2* 8.1*   Liver Function Tests: Recent Labs  Lab  06/03/22 0012  AST 34  ALT 26  ALKPHOS 96  BILITOT 0.8  PROT 6.0*  ALBUMIN 2.4*   No results for input(s): "LIPASE", "AMYLASE" in the last 168 hours. No results for input(s): "AMMONIA" in the last 168 hours. CBC: Recent Labs  Lab 06/03/22 0012 06/05/22 0313 06/06/22 0315  WBC 2.3* 2.6* 3.4*  NEUTROABS 1.2*  --   --   HGB 7.9* 7.7* 7.8*  HCT 25.5* 24.9* 25.5*  MCV 104.5* 105.5* 103.2*  PLT 83* 75* 84*   Cardiac Enzymes: No results for input(s): "CKTOTAL", "CKMB", "CKMBINDEX", "TROPONINI" in the last 168 hours. BNP: Invalid input(s): "POCBNP" CBG: Recent Labs  Lab 06/07/22 1147 06/07/22 1635 06/07/22 2126 06/08/22 0755 06/08/22 1155  GLUCAP 143* 85 104* 143* 159*   D-Dimer No results for input(s): "DDIMER" in the last 72 hours. Hgb A1c No results for input(s): "HGBA1C" in the last 72 hours. Lipid Profile No results for input(s): "CHOL", "HDL", "LDLCALC", "TRIG", "CHOLHDL", "LDLDIRECT" in the last 72 hours. Thyroid function studies No results for input(s): "TSH", "T4TOTAL", "T3FREE", "THYROIDAB" in the last 72 hours.  Invalid input(s): "  FREET3" Anemia work up Recent Labs    06/06/22 0315  VITAMINB12 600   Urinalysis No results found for: "COLORURINE", "APPEARANCEUR", "LABSPEC", "PHURINE", "GLUCOSEU", "HGBUR", "BILIRUBINUR", "KETONESUR", "PROTEINUR", "UROBILINOGEN", "NITRITE", "LEUKOCYTESUR" Sepsis Labs Recent Labs  Lab 06/03/22 0012 06/05/22 0313 06/06/22 0315  WBC 2.3* 2.6* 3.4*   Microbiology Recent Results (from the past 240 hour(s))  MRSA Next Gen by PCR, Nasal     Status: Abnormal   Collection Time: 06/03/22  6:05 AM   Specimen: Nasal Mucosa; Nasal Swab  Result Value Ref Range Status   MRSA by PCR Next Gen DETECTED (A) NOT DETECTED Final    Comment: RESULT CALLED TO, READ BACK BY AND VERIFIED WITH: HALL, J. RN AT 0350 ON 06/03/2022 BY MECIAL J. (NOTE) The GeneXpert MRSA Assay (FDA approved for NASAL specimens only), is one component of a  comprehensive MRSA colonization surveillance program. It is not intended to diagnose MRSA infection nor to guide or monitor treatment for MRSA infections. Test performance is not FDA approved in patients less than 70 years old. Performed at [facility], [place] [address] [place], [place] [address]   Culture, blood (Routine X 2) w Reflex to ID Panel     Status: None   Collection Time: 06/03/22  8:58 AM   Specimen: BLOOD  Result Value Ref Range Status   Specimen Description   Final    BLOOD LEFT ANTECUBITAL Performed at [facility] [address], [place], [place] [address]    Special Requests   Final    IN PEDIATRIC BOTTLE Blood Culture adequate volume Performed at [facility] [address], [place], [place] [address]    Culture   Final    NO GROWTH 5 DAYS Performed at [facility], [place] [address], [place], [place] [address]    Report Status 06/08/2022 FINAL  Final  Culture, blood (Routine X 2) w Reflex to ID Panel     Status: None   Collection Time: 06/03/22  8:58 AM   Specimen: BLOOD LEFT HAND  Result Value Ref Range Status   Specimen Description   Final    BLOOD LEFT HAND Performed at [facility] [address] [place], [place] [address]    Special Requests   Final    IN PEDIATRIC BOTTLE Blood Culture adequate volume Performed at [facility] [address] [place], [place] [address]    Culture   Final    NO GROWTH 5 DAYS Performed at [facility], [place] [address], [place], [place] [address]    Report Status 06/08/2022 FINAL  Final  Aerobic/Anaerobic Culture w Gram Stain (surgical/deep wound)     Status: None (Preliminary result)   Collection Time: 06/04/22  9:57 AM   Specimen: Wound  Result Value Ref Range Status   Specimen Description   Final    WOUND BONE Performed at [facility] [address] [place], [place] [address]     Special Requests   Final    LACTOSE FERMENTER Performed at [facility] [address], [place], [place] [address]    Gram Stain   Final    NO SQUAMOUS EPITHELIAL CELLS SEEN RARE WBC SEEN NO ORGANISMS SEEN Performed at [facility], [place] [address], [place], [place] [address]    Culture   Final    RARE METHICILLIN RESISTANT STAPHYLOCOCCUS AUREUS NO ANAEROBES ISOLATED; CULTURE IN PROGRESS FOR 5 DAYS  Report Status PENDING  Incomplete   Organism ID, Bacteria METHICILLIN RESISTANT STAPHYLOCOCCUS AUREUS  Final      Susceptibility   Methicillin resistant staphylococcus aureus - MIC*    CIPROFLOXACIN >=8 RESISTANT Resistant     ERYTHROMYCIN >=8 RESISTANT Resistant     GENTAMICIN <=0.5 SENSITIVE Sensitive     OXACILLIN >=4 RESISTANT Resistant     TETRACYCLINE <=1 SENSITIVE Sensitive     VANCOMYCIN <=0.5 SENSITIVE Sensitive     TRIMETH/SULFA <=10 SENSITIVE Sensitive     CLINDAMYCIN >=8 RESISTANT Resistant     RIFAMPIN <=0.5 SENSITIVE Sensitive     Inducible Clindamycin NEGATIVE Sensitive     * RARE METHICILLIN RESISTANT STAPHYLOCOCCUS AUREUS     Time coordinating discharge: 83mins  SIGNED:   [name], MD  [facility] 06/08/2022, 1:43 PM   If 7PM-7AM, please contact night-coverage [email or web address]

## 2022-06-08 NOTE — Progress Notes (Signed)
Occupational Therapy Treatment Patient Details Name: Ariel Arnold MRN: [id number] DOB: [birth year] Today's Date: 06/08/2022   History of present illness 66 y.o. female presented to the ER in [place] on 06/01/2022, with complaints of confusion and fever as well as foul-smelling discharge coming from left foot amputation site. Found to have osteomyelitis      s/p revision L TMA per podiatry on 06/04/22.  PMH: HTN type II DM, PVD, [name] leading to liver cirrhosis, chronic pancytopenia, HLD, depression, hx L TMA 03/2022   OT comments  Patient was able to engage in seated hygiene tasks with reduced assistance on this date. Patient continues to have difficulty with maintaining WB status during transitions from sit to stand and when standing during attempts at functional tasks. Patient would continue to benefit from skilled OT services at this time while admitted and after d/c to address noted deficits in order to improve overall safety and independence in ADLs.     Recommendations for follow up therapy are one component of a multi-disciplinary discharge planning process, led by the attending physician.  Recommendations may be updated based on patient status, additional functional criteria and insurance authorization.    Follow Up Recommendations  Skilled nursing-short term rehab (<3 hours/day)    Assistance Recommended at Discharge Frequent or constant Supervision/Assistance  Patient can return home with the following  A lot of help with walking and/or transfers;A lot of help with bathing/dressing/bathroom;Assistance with cooking/housework;Direct supervision/assist for financial management;Assist for transportation;Direct supervision/assist for medications management;Help with stairs or ramp for entrance   Equipment Recommendations  None recommended by OT    Recommendations for Other Services      Precautions / Restrictions Precautions Precautions: Fall Restrictions Weight Bearing Restrictions:  Yes LLE Weight Bearing: Non weight bearing       Mobility Bed Mobility               General bed mobility comments: patient was on BSC at start of session and transitioned to recliner at end of session    Transfers                         Balance Overall balance assessment: Needs assistance Sitting-balance support: No upper extremity supported Sitting balance-Leahy Scale: Good     Standing balance support: Reliant on assistive device for balance, During functional activity, Bilateral upper extremity supported Standing balance-Leahy Scale: Poor Standing balance comment: needs BUE support to maintian NWB LLE                           ADL either performed or assessed with clinical judgement   ADL Overall ADL's : Needs assistance/impaired     Grooming: Set up;Sitting   Upper Body Bathing: Set up;Sitting   Lower Body Bathing: Moderate assistance;Sitting/lateral leans   Upper Body Dressing : Set up;Sitting       Toilet Transfer: Moderate assistance;BSC/3in1;Rolling walker (2 wheels) Toilet Transfer Details (indicate cue type and reason): with patient needing increased multimodal cues to keep LLE off the floor. "you sound like the therapist at SNF" per patient report. Toileting- Clothing Manipulation and Hygiene: Sitting/lateral lean;Minimal assistance Toileting - Clothing Manipulation Details (indicate cue type and reason): with increased time with patient reporting redness with wiping. redness noted on tissue with nurse made aware.            Extremity/Trunk Assessment              Vision  Patient Visual Report: No change from baseline     Perception     Praxis      Cognition Arousal/Alertness: Awake/alert Behavior During Therapy: WFL for tasks assessed/performed Overall Cognitive Status: Within Functional Limits for tasks assessed                                          Exercises      Shoulder  Instructions       General Comments      Pertinent Vitals/ Pain       Pain Assessment Pain Assessment: Faces Faces Pain Scale: Hurts a little bit Pain Location: LLE Pain Descriptors / Indicators: Discomfort, Grimacing Pain Intervention(s): Limited activity within patient's tolerance, Monitored during session  Home Living                                          Prior Functioning/Environment              Frequency  Min 2X/week        Progress Toward Goals  OT Goals(current goals can now be found in the care plan section)  Progress towards OT goals: Progressing toward goals     Plan Discharge plan remains appropriate    Co-evaluation                 AM-PAC OT "6 Clicks" Daily Activity     Outcome Measure   Help from another person eating meals?: None Help from another person taking care of personal grooming?: None Help from another person toileting, which includes using toliet, bedpan, or urinal?: A Lot Help from another person bathing (including washing, rinsing, drying)?: A Lot Help from another person to put on and taking off regular upper body clothing?: A Little Help from another person to put on and taking off regular lower body clothing?: A Lot 6 Click Score: 17    End of Session Equipment Utilized During Treatment: Rolling walker (2 wheels);Gait belt  OT Visit Diagnosis: Unsteadiness on feet (R26.81);Other abnormalities of gait and mobility (R26.89);Muscle weakness (generalized) (M62.81);Pain   Activity Tolerance Patient tolerated treatment well   Patient Left in chair;with chair alarm set;with call bell/phone within reach   Nurse Communication Other (comment) (redness with wiping on tissue)        Time: 2409-7353 OT Time Calculation (min): 24 min  Charges: OT General Charges $OT Visit: 1 Visit OT Treatments $Self Care/Home Management : 23-37 mins  Sharyn Blitz OTR/L, MS Acute Rehabilitation Department Office#  [phone] Pager# [phone]   Ardyth Harps 06/08/2022, 11:02 AM

## 2022-06-08 NOTE — TOC Transition Note (Signed)
Transition of Care [facility]) - CM/SW Discharge Note  Patient Details  Name: Ariel Arnold MRN: [id number] Date of Birth: 1956-04-21  Transition of Care [facility]) CM/SW Contact:  Ewing Schlein, LCSW Phone Number: 06/08/2022, 2:09 PM  Clinical Narrative: Patient's insurance has approved her for SNF. Plan Auth ID is: [id number]. Reference ID is: [id number]. Patient has been approved for 06/08/2022-06/10/2022.  CSW confirmed admission with Lurena Joiner in admissions at [facility] that patient can be admitted today. Patient will go to room 202 and the number for report is [phone]. Discharge summary, discharge orders, and SNF transfer report faxed to facility in hub. H&P faxed to facility ([phone]).  Medical necessity form done; PTAR scheduled. Discharge packet completed. CSW notified patient and daughter of discharge. RN updated. TOC signing off.  Final next level of care: Skilled Nursing Facility Barriers to Discharge: Barriers Resolved  Patient Goals and CMS Choice Patient states their goals for this hospitalization and ongoing recovery are:: Return to [facility] rehab CMS Medicare.gov Compare Post Acute Care list provided to:: Patient Choice offered to / list presented to : Patient, Adult Children  Discharge Placement       Patient chooses bed at: [facility] Patient to be transferred to facility by: PTAR Name of family member notified: [name] (daughter) Patient and family notified of of transfer: 06/08/22  Discharge Plan and Services In-house Referral: Clinical Social Work Discharge Planning Services: NA Post Acute Care Choice: Skilled Nursing Facility          DME Arranged: N/A DME Agency: NA  Readmission Risk Interventions    11/16/2021    9:27 AM  Readmission Risk Prevention Plan  Home Care Screening Complete  Medication Review (RN CM) Complete

## 2022-06-09 LAB — AEROBIC/ANAEROBIC CULTURE W GRAM STAIN (SURGICAL/DEEP WOUND): Gram Stain: NONE SEEN

## 2022-06-14 ENCOUNTER — Ambulatory Visit (INDEPENDENT_AMBULATORY_CARE_PROVIDER_SITE_OTHER): Payer: Self-pay | Admitting: Podiatry

## 2022-06-14 DIAGNOSIS — Z9889 Other specified postprocedural states: Secondary | ICD-10-CM

## 2022-06-14 NOTE — Progress Notes (Signed)
Error

## 2022-07-05 ENCOUNTER — Other Ambulatory Visit: Payer: Self-pay | Admitting: Cardiology

## 2022-07-05 ENCOUNTER — Ambulatory Visit: Payer: Medicare Other | Admitting: Family

## 2022-07-07 ENCOUNTER — Telehealth: Payer: Self-pay

## 2022-07-07 NOTE — Telephone Encounter (Signed)
Called patient to schedule appointment [facility] appointment. Missed appointment with Ariel Fila, FNP. Left voicemail requesting call back. Ariel Arnold, RMA

## 2022-07-13 ENCOUNTER — Ambulatory Visit (INDEPENDENT_AMBULATORY_CARE_PROVIDER_SITE_OTHER): Payer: Medicare Other | Admitting: Podiatry

## 2022-07-13 DIAGNOSIS — Z91199 Patient's noncompliance with other medical treatment and regimen due to unspecified reason: Secondary | ICD-10-CM

## 2022-07-13 NOTE — Progress Notes (Signed)
Subjective:  Patient ID: Ariel Arnold, female    DOB: [birth year],  MRN: [id number]  No chief complaint on file.   DOS: 06/04/22 Procedure: Left foot transmetatarsal amputation revision  66 y.o. female returns for POV#1. Patient has missed last several follow-up appointments. She is five weeks out from last revision of left foot amputation   Review of Systems: Negative except as noted in the HPI. Denies N/V/F/Ch.  Past Medical History:  Diagnosis Date   Anemia    Anxiety    Aortic atherosclerosis (HCC)    Arthritis    AVM (arteriovenous malformation) of colon    Depression    Essential hypertension    GI bleeding    Recurrent   Hepatic encephalopathy (HCC)    History of kidney stones    History of RSV infection    Liver cirrhosis secondary to NASH (HCC) 2004   Biopsy-proven   Obesity    Osteomyelitis (HCC)    Peripheral vascular disease (HCC)    Thrombocytopenia (HCC)    Type 2 diabetes mellitus (HCC)     Current Outpatient Medications:    ACCU-CHEK GUIDE test strip, , Disp: , Rfl:    Accu-Chek Softclix Lancets lancets, SMARTSIG:2 Topical Twice Daily, Disp: , Rfl:    albuterol (VENTOLIN HFA) 108 (90 Base) MCG/ACT inhaler, Inhale 2 puffs into the lungs every 4 (four) hours as needed for shortness of breath or wheezing., Disp: , Rfl:    Blood Glucose Monitoring Suppl (ACCU-CHEK GUIDE) w/Device KIT, , Disp: , Rfl:    Calcium Carbonate-Vit D-Min (CALCIUM 1200) 1200-1000 MG-UNIT CHEW, Chew 2 tablets by mouth daily., Disp: , Rfl:    carvedilol (COREG) 6.25 MG tablet, Take 1 tablet (6.25 mg total) by mouth 2 (two) times daily., Disp: 60 tablet, Rfl: 0   clotrimazole-betamethasone (LOTRISONE) cream, Apply topically 2 (two) times daily., Disp: , Rfl:    diclofenac Sodium (VOLTAREN) 1 % GEL, Apply 2 g topically in the morning and at bedtime., Disp: , Rfl:    ferrous sulfate 325 (65 FE) MG tablet, Take 325 mg by mouth in the morning and at bedtime., Disp: , Rfl:    folic acid  (FOLVITE) 1 MG tablet, Take 1 mg by mouth 2 (two) times daily., Disp: , Rfl:    furosemide (LASIX) 20 MG tablet, Take 20 mg by mouth daily as needed for fluid., Disp: , Rfl:    gabapentin (NEURONTIN) 600 MG tablet, Take 600 mg by mouth 3 (three) times daily as needed (nerve pain)., Disp: , Rfl:    GLOBAL EASE INJECT PEN NEEDLES 32G X 4 MM MISC, , Disp: , Rfl:    HUMALOG KWIKPEN 100 UNIT/ML KwikPen, Inject 6 Units into the skin in the morning, at noon, in the evening, and at bedtime., Disp: , Rfl:    ketorolac (ACULAR) 0.5 % ophthalmic solution, Place 1 drop into both eyes 4 (four) times daily as needed (dry eyes)., Disp: , Rfl:    pantoprazole (PROTONIX) 40 MG tablet, Take 40 mg by mouth daily as needed (heartburn)., Disp: , Rfl:    pravastatin (PRAVACHOL) 20 MG tablet, Take 1 tablet (20 mg total) by mouth at bedtime. Take after completion of daptomycin, Disp: , Rfl:    sertraline (ZOLOFT) 100 MG tablet, Take 2 tablets (200 mg total) by mouth daily., Disp: 60 tablet, Rfl: 0   spironolactone (ALDACTONE) 50 MG tablet, Take 50 mg by mouth daily as needed (swelling)., Disp: , Rfl:    TOUJEO MAX SOLOSTAR 300 UNIT/ML Solostar  Pen, Inject 60 Units into the skin at bedtime., Disp: , Rfl:    vancomycin IVPB, Inject 1,000 mg into the vein daily. Indication:  MRSA bacteremia First Dose: Yes Last Day of Therapy:  07/13/2022 Labs - Sunday/Monday:  CBC/D, BMP, and vancomycin trough. Labs - Thursday:  BMP and vancomycin trough Labs - Every other week:  ESR and CRP Method of administration:Elastomeric Method of administration may be changed at the discretion of the patient and/or caregiver's ability to self-administer the medication ordered., Disp: 36 Units, Rfl: 0   vitamin C (ASCORBIC ACID) 500 MG tablet, Take 500 mg by mouth 2 (two) times daily., Disp: , Rfl:    Vitamin D, Ergocalciferol, (DRISDOL) 1.25 MG (50000 UNIT) CAPS capsule, Take 1 capsule by mouth every 7 (seven) days. Mondays, Disp: , Rfl:   Social  History   Tobacco Use  Smoking Status Former   Types: Cigarettes  Smokeless Tobacco Never    Allergies  Allergen Reactions   Chlorhexidine Rash   Tape Rash   Objective:  There were no vitals filed for this visit. There is no height or weight on file to calculate BMI. Constitutional Well developed. Well nourished.  Vascular Foot warm and well perfused. Capillary refill normal to all digits.   Neurologic Normal speech. Oriented to person, place, and time. Epicritic sensation to light touch grossly present bilaterally.  Dermatologic Skin healing well without signs of infection. Skin edges well coapted without signs of infection.  Orthopedic: Tenderness to palpation noted about the surgical site.   Radiographs: Interval resection of metatarsals on left foot.  Assessment:   1. Status post surgery   2. History of amputation of left foot (HCC)    Plan:  Patient was evaluated and treated and all questions answered.  S/p foot surgery left -Progressing as expected post-operatively. -WB Status: WBAT in surgical shoe -Sutures/ staples : removed without incident. -Medications: n/a -Foot redressed. Return in 3 weeks for check.   No follow-ups on file.

## 2022-07-19 ENCOUNTER — Ambulatory Visit (INDEPENDENT_AMBULATORY_CARE_PROVIDER_SITE_OTHER): Payer: Medicare Other | Admitting: Podiatry

## 2022-07-19 DIAGNOSIS — Z91199 Patient's noncompliance with other medical treatment and regimen due to unspecified reason: Secondary | ICD-10-CM

## 2022-07-19 NOTE — Progress Notes (Unsigned)
No show

## 2022-07-20 ENCOUNTER — Ambulatory Visit (INDEPENDENT_AMBULATORY_CARE_PROVIDER_SITE_OTHER): Payer: Medicare Other | Admitting: Podiatry

## 2022-07-20 ENCOUNTER — Encounter: Payer: Self-pay | Admitting: Podiatry

## 2022-07-20 DIAGNOSIS — Z9889 Other specified postprocedural states: Secondary | ICD-10-CM | POA: Diagnosis not present

## 2022-07-20 DIAGNOSIS — Z89432 Acquired absence of left foot: Secondary | ICD-10-CM

## 2022-07-20 NOTE — Progress Notes (Addendum)
Subjective:  Patient ID: Ariel Arnold, female    DOB: 12-07-1956,  MRN: [id number]  Chief Complaint  Patient presents with   Routine Post Op    Patient has been home from rehab 3 days and has least fell 2 twice , trying to get out of the bed , patient states floor is hard and slick. Patient states she did not fall on her foot. Dressing was unwrapped , discharge has been seen , patient states pain varies. Home health has been to the house seen patient has been home from rehab.     DOS: 04/05/22 and 06/04/22 Procedure: Left transmetatarsal revisions.   66 y.o. female returns for POV#1. She is now 7 weeks out from most recent surgery.  Patient has missed several post-op appointments since prior surgeries. She has been in rehab and has been home from rehab for 3 days and fell several times. She has had home health coming to help with the foot.   Review of Systems: Negative except as noted in the HPI. Denies N/V/F/Ch.  Past Medical History:  Diagnosis Date   Anemia    Anxiety    Aortic atherosclerosis (HCC)    Arthritis    AVM (arteriovenous malformation) of colon    Depression    Essential hypertension    GI bleeding    Recurrent   Hepatic encephalopathy ([place])    History of kidney stones    History of RSV infection    Liver cirrhosis secondary to NASH ([place]) 2004   Biopsy-proven   Obesity    Osteomyelitis (HCC)    Peripheral vascular disease (HCC)    Thrombocytopenia (HCC)    Type 2 diabetes mellitus (HCC)     Current Outpatient Medications:    ACCU-CHEK GUIDE test strip, , Disp: , Rfl:    Accu-Chek Softclix Lancets lancets, SMARTSIG:2 Topical Twice Daily, Disp: , Rfl:    albuterol (VENTOLIN HFA) 108 (90 Base) MCG/ACT inhaler, Inhale 2 puffs into the lungs every 4 (four) hours as needed for shortness of breath or wheezing., Disp: , Rfl:    Blood Glucose Monitoring Suppl (ACCU-CHEK GUIDE) w/Device KIT, , Disp: , Rfl:    Calcium Carbonate-Vit D-Min (CALCIUM 1200) 1200-1000  MG-UNIT CHEW, Chew 2 tablets by mouth daily., Disp: , Rfl:    carvedilol (COREG) 6.25 MG tablet, Take 1 tablet (6.25 mg total) by mouth 2 (two) times daily., Disp: 60 tablet, Rfl: 0   clotrimazole-betamethasone (LOTRISONE) cream, Apply topically 2 (two) times daily., Disp: , Rfl:    diclofenac Sodium (VOLTAREN) 1 % GEL, Apply 2 g topically in the morning and at bedtime., Disp: , Rfl:    ferrous sulfate 325 (65 FE) MG tablet, Take 325 mg by mouth in the morning and at bedtime., Disp: , Rfl:    folic acid (FOLVITE) 1 MG tablet, Take 1 mg by mouth 2 (two) times daily., Disp: , Rfl:    furosemide (LASIX) 20 MG tablet, Take 20 mg by mouth daily as needed for fluid., Disp: , Rfl:    gabapentin (NEURONTIN) 600 MG tablet, Take 600 mg by mouth 3 (three) times daily as needed (nerve pain)., Disp: , Rfl:    GLOBAL EASE INJECT PEN NEEDLES 32G X 4 MM MISC, , Disp: , Rfl:    HUMALOG KWIKPEN 100 UNIT/ML KwikPen, Inject 6 Units into the skin in the morning, at noon, in the evening, and at bedtime., Disp: , Rfl:    ketorolac (ACULAR) 0.5 % ophthalmic solution, Place 1 drop into both  eyes 4 (four) times daily as needed (dry eyes)., Disp: , Rfl:    pantoprazole (PROTONIX) 40 MG tablet, Take 40 mg by mouth daily as needed (heartburn)., Disp: , Rfl:    pravastatin (PRAVACHOL) 20 MG tablet, Take 1 tablet (20 mg total) by mouth at bedtime. Take after completion of daptomycin, Disp: , Rfl:    sertraline (ZOLOFT) 100 MG tablet, Take 2 tablets (200 mg total) by mouth daily., Disp: 60 tablet, Rfl: 0   spironolactone (ALDACTONE) 50 MG tablet, Take 50 mg by mouth daily as needed (swelling)., Disp: , Rfl:    TOUJEO MAX SOLOSTAR 300 UNIT/ML Solostar Pen, Inject 60 Units into the skin at bedtime., Disp: , Rfl:    vitamin C (ASCORBIC ACID) 500 MG tablet, Take 500 mg by mouth 2 (two) times daily., Disp: , Rfl:    Vitamin D, Ergocalciferol, (DRISDOL) 1.25 MG (50000 UNIT) CAPS capsule, Take 1 capsule by mouth every 7 (seven) days.  Mondays, Disp: , Rfl:   Social History   Tobacco Use  Smoking Status Former   Types: Cigarettes  Smokeless Tobacco Never    Allergies  Allergen Reactions   Chlorhexidine Rash   Tape Rash   Objective:  There were no vitals filed for this visit. There is no height or weight on file to calculate BMI. Constitutional Well developed. Well nourished.  Vascular Foot warm and well perfused. Capillary refill normal to all digits.   Neurologic Normal speech. Oriented to person, place, and time. Epicritic sensation to light touch grossly present bilaterally.  Dermatologic  Wound dehiscence noted to distal left transmetatrsal site about 8 cm x 4 cm x 3 cm with healthy granular base. No erythema edema or purulence noted.   Orthopedic: Tenderness to palpation noted about the surgical site.   Radiographs: Interval resection of metatarsals on the left.  Assessment:   1. Status post surgery   2. History of amputation of left foot ([place])    Plan:  Patient was evaluated and treated and all questions answered.  S/p foot surgery left -Progressing as expected post-operatively. -WB Status: WBAT  in surgical shoe  -Wound dehiscence noted to distal left hallux debrided without incident. -Surgical shoe dispensed.   -Medications: n/a -Foot redressed. Follow-up in 2 weeks   No follow-ups on file.

## 2022-07-21 ENCOUNTER — Telehealth: Payer: Self-pay

## 2022-07-21 ENCOUNTER — Ambulatory Visit: Payer: Medicare Other | Admitting: Internal Medicine

## 2022-07-21 NOTE — Telephone Encounter (Signed)
Attempted to contact to reschedule missed appt from July, vm full.

## 2022-08-03 ENCOUNTER — Ambulatory Visit (INDEPENDENT_AMBULATORY_CARE_PROVIDER_SITE_OTHER): Payer: Medicare Other | Admitting: Podiatry

## 2022-08-03 ENCOUNTER — Encounter: Payer: Self-pay | Admitting: Podiatry

## 2022-08-03 DIAGNOSIS — Z9889 Other specified postprocedural states: Secondary | ICD-10-CM

## 2022-08-03 DIAGNOSIS — Z89432 Acquired absence of left foot: Secondary | ICD-10-CM

## 2022-08-03 NOTE — Progress Notes (Signed)
Subjective:  Patient ID: Ariel Arnold, female    DOB: 06/16/1956,  MRN: [id number]  Chief Complaint  Patient presents with   Wound Check    2 week wound check , patient has now formed a blood blister on the right foot near great toe, left foot wound is now having a lot of discharge , still open. No pain    DOS: 04/05/22 and 06/04/22 Procedure: Left transmetatarsal revisions.   66 y.o. female returns for POV#1. She is now 9 weeks out from most recent surgery.  She has had home health coming to help with the foot. Relates drainage on the left and now a blood blister forming on the right.   Review of Systems: Negative except as noted in the HPI. Denies N/V/F/Ch.  Past Medical History:  Diagnosis Date   Anemia    Anxiety    Aortic atherosclerosis (HCC)    Arthritis    AVM (arteriovenous malformation) of colon    Depression    Essential hypertension    GI bleeding    Recurrent   Hepatic encephalopathy ([place])    History of kidney stones    History of RSV infection    Liver cirrhosis secondary to NASH ([place]) 2004   Biopsy-proven   Obesity    Osteomyelitis (HCC)    Peripheral vascular disease (HCC)    Thrombocytopenia (HCC)    Type 2 diabetes mellitus (HCC)     Current Outpatient Medications:    ACCU-CHEK GUIDE test strip, , Disp: , Rfl:    Accu-Chek Softclix Lancets lancets, SMARTSIG:2 Topical Twice Daily, Disp: , Rfl:    albuterol (VENTOLIN HFA) 108 (90 Base) MCG/ACT inhaler, Inhale 2 puffs into the lungs every 4 (four) hours as needed for shortness of breath or wheezing., Disp: , Rfl:    Blood Glucose Monitoring Suppl (ACCU-CHEK GUIDE) w/Device KIT, , Disp: , Rfl:    Calcium Carbonate-Vit D-Min (CALCIUM 1200) 1200-1000 MG-UNIT CHEW, Chew 2 tablets by mouth daily., Disp: , Rfl:    carvedilol (COREG) 6.25 MG tablet, Take 1 tablet (6.25 mg total) by mouth 2 (two) times daily., Disp: 60 tablet, Rfl: 0   clotrimazole-betamethasone (LOTRISONE) cream, Apply topically 2 (two) times  daily., Disp: , Rfl:    diclofenac Sodium (VOLTAREN) 1 % GEL, Apply 2 g topically in the morning and at bedtime., Disp: , Rfl:    ferrous sulfate 325 (65 FE) MG tablet, Take 325 mg by mouth in the morning and at bedtime., Disp: , Rfl:    folic acid (FOLVITE) 1 MG tablet, Take 1 mg by mouth 2 (two) times daily., Disp: , Rfl:    furosemide (LASIX) 20 MG tablet, Take 20 mg by mouth daily as needed for fluid., Disp: , Rfl:    gabapentin (NEURONTIN) 600 MG tablet, Take 600 mg by mouth 3 (three) times daily as needed (nerve pain)., Disp: , Rfl:    GLOBAL EASE INJECT PEN NEEDLES 32G X 4 MM MISC, , Disp: , Rfl:    HUMALOG KWIKPEN 100 UNIT/ML KwikPen, Inject 6 Units into the skin in the morning, at noon, in the evening, and at bedtime., Disp: , Rfl:    ketorolac (ACULAR) 0.5 % ophthalmic solution, Place 1 drop into both eyes 4 (four) times daily as needed (dry eyes)., Disp: , Rfl:    pantoprazole (PROTONIX) 40 MG tablet, Take 40 mg by mouth daily as needed (heartburn)., Disp: , Rfl:    pravastatin (PRAVACHOL) 20 MG tablet, Take 1 tablet (20 mg total) by  mouth at bedtime. Take after completion of daptomycin, Disp: , Rfl:    sertraline (ZOLOFT) 100 MG tablet, Take 2 tablets (200 mg total) by mouth daily., Disp: 60 tablet, Rfl: 0   spironolactone (ALDACTONE) 50 MG tablet, Take 50 mg by mouth daily as needed (swelling)., Disp: , Rfl:    TOUJEO MAX SOLOSTAR 300 UNIT/ML Solostar Pen, Inject 60 Units into the skin at bedtime., Disp: , Rfl:    vitamin C (ASCORBIC ACID) 500 MG tablet, Take 500 mg by mouth 2 (two) times daily., Disp: , Rfl:    Vitamin D, Ergocalciferol, (DRISDOL) 1.25 MG (50000 UNIT) CAPS capsule, Take 1 capsule by mouth every 7 (seven) days. Mondays, Disp: , Rfl:   Social History   Tobacco Use  Smoking Status Former   Types: Cigarettes  Smokeless Tobacco Never    Allergies  Allergen Reactions   Chlorhexidine Rash   Tape Rash   Objective:  There were no vitals filed for this  visit. There is no height or weight on file to calculate BMI. Constitutional Well developed. Well nourished.  Vascular Foot warm and well perfused. Capillary refill normal to all digits.   Neurologic Normal speech. Oriented to person, place, and time. Epicritic sensation to light touch grossly present bilaterally.  Dermatologic  Wound dehiscence noted to distal left transmetatrsal site about 6.5 cm x 3 cm x 2 cm with healthy granular base. No erythema edema or purulence noted.   Orthopedic: Tenderness to palpation noted about the surgical site.   Radiographs: Interval resection of metatarsals on the left.  Assessment:   1. Status post surgery   2. History of amputation of left foot ([place])     Plan:  Patient was evaluated and treated and all questions answered.  S/p foot surgery left -Progressing as expected post-operatively. -WB Status: WBAT  in surgical shoe  -Wound dehiscence noted to distal left hallux debrided without incident. -Surgical shoe continue.   -Medications: n/a -Foot redressed. Follow-up in 2 weeks   No follow-ups on file.

## 2022-08-19 ENCOUNTER — Other Ambulatory Visit: Payer: Self-pay

## 2022-08-19 ENCOUNTER — Emergency Department (HOSPITAL_COMMUNITY): Payer: Medicare Other

## 2022-08-19 ENCOUNTER — Inpatient Hospital Stay (HOSPITAL_COMMUNITY)
Admission: EM | Admit: 2022-08-19 | Discharge: 2022-08-31 | DRG: 856 | Disposition: A | Discharge status: Skilled Nursing Facility | Payer: Medicare Other | Attending: Internal Medicine | Admitting: Internal Medicine

## 2022-08-19 ENCOUNTER — Encounter (HOSPITAL_COMMUNITY): Payer: Self-pay

## 2022-08-19 DIAGNOSIS — M869 Osteomyelitis, unspecified: Principal | ICD-10-CM

## 2022-08-19 DIAGNOSIS — N182 Chronic kidney disease, stage 2 (mild): Secondary | ICD-10-CM | POA: Diagnosis present

## 2022-08-19 DIAGNOSIS — K7460 Unspecified cirrhosis of liver: Secondary | ICD-10-CM | POA: Diagnosis present

## 2022-08-19 DIAGNOSIS — Z87891 Personal history of nicotine dependence: Secondary | ICD-10-CM

## 2022-08-19 DIAGNOSIS — Z89512 Acquired absence of left leg below knee: Secondary | ICD-10-CM

## 2022-08-19 DIAGNOSIS — I2510 Atherosclerotic heart disease of native coronary artery without angina pectoris: Secondary | ICD-10-CM | POA: Diagnosis present

## 2022-08-19 DIAGNOSIS — K7581 Nonalcoholic steatohepatitis (NASH): Secondary | ICD-10-CM | POA: Diagnosis present

## 2022-08-19 DIAGNOSIS — I10 Essential (primary) hypertension: Secondary | ICD-10-CM | POA: Diagnosis present

## 2022-08-19 DIAGNOSIS — E1142 Type 2 diabetes mellitus with diabetic polyneuropathy: Secondary | ICD-10-CM | POA: Diagnosis present

## 2022-08-19 DIAGNOSIS — E1152 Type 2 diabetes mellitus with diabetic peripheral angiopathy with gangrene: Secondary | ICD-10-CM | POA: Diagnosis present

## 2022-08-19 DIAGNOSIS — D631 Anemia in chronic kidney disease: Secondary | ICD-10-CM | POA: Diagnosis present

## 2022-08-19 DIAGNOSIS — D61818 Other pancytopenia: Secondary | ICD-10-CM | POA: Diagnosis present

## 2022-08-19 DIAGNOSIS — I96 Gangrene, not elsewhere classified: Secondary | ICD-10-CM | POA: Diagnosis present

## 2022-08-19 DIAGNOSIS — D696 Thrombocytopenia, unspecified: Secondary | ICD-10-CM | POA: Diagnosis present

## 2022-08-19 DIAGNOSIS — Z6829 Body mass index (BMI) 29.0-29.9, adult: Secondary | ICD-10-CM

## 2022-08-19 DIAGNOSIS — Z79899 Other long term (current) drug therapy: Secondary | ICD-10-CM

## 2022-08-19 DIAGNOSIS — I5032 Chronic diastolic (congestive) heart failure: Secondary | ICD-10-CM | POA: Diagnosis present

## 2022-08-19 DIAGNOSIS — T8781 Dehiscence of amputation stump: Secondary | ICD-10-CM | POA: Diagnosis present

## 2022-08-19 DIAGNOSIS — E119 Type 2 diabetes mellitus without complications: Secondary | ICD-10-CM

## 2022-08-19 DIAGNOSIS — T148XXA Other injury of unspecified body region, initial encounter: Secondary | ICD-10-CM | POA: Diagnosis not present

## 2022-08-19 DIAGNOSIS — Z8614 Personal history of Methicillin resistant Staphylococcus aureus infection: Secondary | ICD-10-CM

## 2022-08-19 DIAGNOSIS — M1990 Unspecified osteoarthritis, unspecified site: Secondary | ICD-10-CM | POA: Diagnosis present

## 2022-08-19 DIAGNOSIS — M86172 Other acute osteomyelitis, left ankle and foot: Secondary | ICD-10-CM | POA: Diagnosis present

## 2022-08-19 DIAGNOSIS — Z89422 Acquired absence of other left toe(s): Secondary | ICD-10-CM

## 2022-08-19 DIAGNOSIS — M86672 Other chronic osteomyelitis, left ankle and foot: Secondary | ICD-10-CM | POA: Diagnosis present

## 2022-08-19 DIAGNOSIS — N189 Chronic kidney disease, unspecified: Secondary | ICD-10-CM | POA: Diagnosis present

## 2022-08-19 DIAGNOSIS — E1122 Type 2 diabetes mellitus with diabetic chronic kidney disease: Secondary | ICD-10-CM | POA: Diagnosis present

## 2022-08-19 DIAGNOSIS — I130 Hypertensive heart and chronic kidney disease with heart failure and stage 1 through stage 4 chronic kidney disease, or unspecified chronic kidney disease: Secondary | ICD-10-CM | POA: Diagnosis present

## 2022-08-19 DIAGNOSIS — Z87442 Personal history of urinary calculi: Secondary | ICD-10-CM

## 2022-08-19 DIAGNOSIS — Z8249 Family history of ischemic heart disease and other diseases of the circulatory system: Secondary | ICD-10-CM

## 2022-08-19 DIAGNOSIS — Z833 Family history of diabetes mellitus: Secondary | ICD-10-CM

## 2022-08-19 DIAGNOSIS — Z91048 Other nonmedicinal substance allergy status: Secondary | ICD-10-CM

## 2022-08-19 DIAGNOSIS — T8141XA Infection following a procedure, superficial incisional surgical site, initial encounter: Principal | ICD-10-CM | POA: Diagnosis present

## 2022-08-19 DIAGNOSIS — Z22322 Carrier or suspected carrier of Methicillin resistant Staphylococcus aureus: Secondary | ICD-10-CM

## 2022-08-19 DIAGNOSIS — Z794 Long term (current) use of insulin: Secondary | ICD-10-CM

## 2022-08-19 DIAGNOSIS — E1169 Type 2 diabetes mellitus with other specified complication: Secondary | ICD-10-CM | POA: Diagnosis present

## 2022-08-19 DIAGNOSIS — Z83438 Family history of other disorder of lipoprotein metabolism and other lipidemia: Secondary | ICD-10-CM

## 2022-08-19 DIAGNOSIS — Y835 Amputation of limb(s) as the cause of abnormal reaction of the patient, or of later complication, without mention of misadventure at the time of the procedure: Secondary | ICD-10-CM | POA: Diagnosis present

## 2022-08-19 DIAGNOSIS — I700 Atherosclerosis of aorta: Secondary | ICD-10-CM | POA: Diagnosis present

## 2022-08-19 DIAGNOSIS — E876 Hypokalemia: Secondary | ICD-10-CM | POA: Diagnosis present

## 2022-08-19 DIAGNOSIS — E43 Unspecified severe protein-calorie malnutrition: Secondary | ICD-10-CM | POA: Diagnosis present

## 2022-08-19 DIAGNOSIS — F411 Generalized anxiety disorder: Secondary | ICD-10-CM | POA: Diagnosis present

## 2022-08-19 DIAGNOSIS — E782 Mixed hyperlipidemia: Secondary | ICD-10-CM | POA: Diagnosis present

## 2022-08-19 DIAGNOSIS — Z90710 Acquired absence of both cervix and uterus: Secondary | ICD-10-CM

## 2022-08-19 DIAGNOSIS — L089 Local infection of the skin and subcutaneous tissue, unspecified: Secondary | ICD-10-CM | POA: Diagnosis present

## 2022-08-19 DIAGNOSIS — K219 Gastro-esophageal reflux disease without esophagitis: Secondary | ICD-10-CM | POA: Diagnosis present

## 2022-08-19 DIAGNOSIS — Z7985 Long-term (current) use of injectable non-insulin antidiabetic drugs: Secondary | ICD-10-CM

## 2022-08-19 DIAGNOSIS — K7682 Hepatic encephalopathy: Secondary | ICD-10-CM | POA: Diagnosis not present

## 2022-08-19 DIAGNOSIS — E872 Acidosis, unspecified: Secondary | ICD-10-CM | POA: Diagnosis present

## 2022-08-19 DIAGNOSIS — F32A Depression, unspecified: Secondary | ICD-10-CM | POA: Diagnosis present

## 2022-08-19 DIAGNOSIS — Z888 Allergy status to other drugs, medicaments and biological substances status: Secondary | ICD-10-CM

## 2022-08-19 LAB — COMPREHENSIVE METABOLIC PANEL
ALT: 19 U/L (ref 0–44)
AST: 37 U/L (ref 15–41)
Albumin: 2.8 g/dL — ABNORMAL LOW (ref 3.5–5.0)
Alkaline Phosphatase: 132 U/L — ABNORMAL HIGH (ref 38–126)
Anion gap: 5 (ref 5–15)
BUN: 24 mg/dL — ABNORMAL HIGH (ref 8–23)
CO2: 26 mmol/L (ref 22–32)
Calcium: 8.3 mg/dL — ABNORMAL LOW (ref 8.9–10.3)
Chloride: 106 mmol/L (ref 98–111)
Creatinine, Ser: 0.82 mg/dL (ref 0.44–1.00)
GFR, Estimated: >60 mL/min (ref >60)
Glucose, Bld: 173 mg/dL — ABNORMAL HIGH (ref 70–99)
Potassium: 4.7 mmol/L (ref 3.5–5.1)
Sodium: 137 mmol/L (ref 135–145)
Total Bilirubin: 1.6 mg/dL — ABNORMAL HIGH (ref 0.3–1.2)
Total Protein: 7.8 g/dL (ref 6.5–8.1)

## 2022-08-19 LAB — I-STAT CHEM 8, ED
BUN: 28 mg/dL — ABNORMAL HIGH (ref 8–23)
Calcium, Ion: 1.2 mmol/L (ref 1.15–1.40)
Chloride: 103 mmol/L (ref 98–111)
Creatinine, Ser: 0.7 mg/dL (ref 0.44–1.00)
Glucose, Bld: 173 mg/dL — ABNORMAL HIGH (ref 70–99)
HCT: 28 % — ABNORMAL LOW (ref 36.0–46.0)
Hemoglobin: 9.5 g/dL — ABNORMAL LOW (ref 12.0–15.0)
Potassium: 4.5 mmol/L (ref 3.5–5.1)
Sodium: 140 mmol/L (ref 135–145)
TCO2: 26 mmol/L (ref 22–32)

## 2022-08-19 LAB — LACTIC ACID, PLASMA: Lactic Acid, Venous: 2.2 mmol/L (ref 0.5–1.9)

## 2022-08-19 MED ORDER — SODIUM CHLORIDE 0.9 % IV SOLN
2.0000 g | Freq: Once | INTRAVENOUS | Status: AC
  Administered 2022-08-20: 2 g via INTRAVENOUS
  Filled 2022-08-19: qty 12.5

## 2022-08-19 MED ORDER — ONDANSETRON HCL 4 MG/2ML IJ SOLN
4.0000 mg | Freq: Once | INTRAMUSCULAR | Status: AC
  Administered 2022-08-19: 4 mg via INTRAVENOUS
  Filled 2022-08-19: qty 2

## 2022-08-19 MED ORDER — MORPHINE SULFATE (PF) 4 MG/ML IV SOLN
4.0000 mg | Freq: Once | INTRAVENOUS | Status: AC
  Administered 2022-08-19: 4 mg via INTRAVENOUS
  Filled 2022-08-19: qty 1

## 2022-08-19 MED ORDER — VANCOMYCIN HCL IN DEXTROSE 1-5 GM/200ML-% IV SOLN: 1000.0000 mg | Freq: Once | INTRAVENOUS | Status: DC

## 2022-08-19 NOTE — ED Provider Triage Note (Signed)
Emergency Medicine Provider Triage Evaluation Note  Ariel Arnold , a 66 y.o. female  was evaluated in triage.  Pt complains of concern for yeast infection, left foot infection, and fatigue.  Patient is status post amputation in June of this year.  Reports green drainage from the wound for the past few days.  Denies fever.   Review of Systems  Positive: As above Negative: As above  Physical Exam  BP (!) 131/57 (BP Location: Left Arm)   Pulse 70   Temp 98 F (36.7 C) (Oral)   Resp 16   Ht 5' 7"  (1.702 m)   Wt 79.8 kg   SpO2 100%   BMI 27.57 kg/m  Gen:   Awake, no distress   Resp:  Normal effort  MSK:   Moves extremities without difficulty  Other:  Dressing over left foot noted to be soaked.  Medical Decision Making  Medically screening exam initiated at 9:10 PM.  Appropriate orders placed.  Ariel Arnold was informed that the remainder of the evaluation will be completed by another provider, this initial triage assessment does not replace that evaluation, and the importance of remaining in the ED until their evaluation is complete.  Nursing aware patient needs to be room soon.   Evlyn Courier, PA-C 08/19/22 2112

## 2022-08-19 NOTE — ED Triage Notes (Signed)
BIB family with multiple complaints. 1. States she's had increased weakness over the last few days, unable to stand for longer periods of time. 2. states she has a yeast infection and would like that taken care of. 3. Seen by her home health nurse earlier today and told her foot was infection, pt is has a partial L foot amputation.

## 2022-08-19 NOTE — ED Provider Notes (Signed)
[facility] DEPT Provider Note   CSN: [id number] Arrival date & time: 08/19/22  2041     History  Chief Complaint  Patient presents with   Multiple Complaints    Ariel Arnold is a 66 y.o. female.  Pt is a 66 yo female with a pmhx significant for anemia, liver cirrhosis secondary to NASH, DM2, HTN, GI bleed, osteomyelitis, PVD, anxiety, depression and recurrent left foot infection.  Pt has had several surgeries to her left foot.  Pt's 2 daughters and home health has been trying to take care of it.  She said her home health care nurse came out today and told her that it was infected.  Pt has been followed by Dr. Blenda Mounts (podiatry).       Home Medications Prior to Admission medications   Medication Sig Start Date End Date Taking? Authorizing Provider  ACCU-CHEK GUIDE test strip  08/27/21   [provider]  Accu-Chek Softclix Lancets lancets SMARTSIG:2 Topical Twice Daily 08/27/21   [provider]  albuterol (VENTOLIN HFA) 108 (90 Base) MCG/ACT inhaler Inhale 2 puffs into the lungs every 4 (four) hours as needed for shortness of breath or wheezing. 01/11/21   [provider]  Blood Glucose Monitoring Suppl (ACCU-CHEK GUIDE) w/Device KIT  08/27/21   [provider]  Calcium Carbonate-Vit D-Min (CALCIUM 1200) 1200-1000 MG-UNIT CHEW Chew 2 tablets by mouth daily.    [provider]  carvedilol (COREG) 6.25 MG tablet Take 1 tablet (6.25 mg total) by mouth 2 (two) times daily. 04/11/22 06/03/22  Ariel Hazel, MD  clotrimazole-betamethasone (LOTRISONE) cream Apply topically 2 (two) times daily. 11/25/21   [provider]  diclofenac Sodium (VOLTAREN) 1 % GEL Apply 2 g topically in the morning and at bedtime. 12/02/21   [provider]  ferrous sulfate 325 (65 FE) MG tablet Take 325 mg by mouth in the morning and at bedtime.    [provider]  folic acid (FOLVITE) 1 MG tablet Take 1 mg by mouth 2  (two) times daily. 06/02/21   [provider]  furosemide (LASIX) 20 MG tablet Take 20 mg by mouth daily as needed for fluid. 05/27/21   [provider]  gabapentin (NEURONTIN) 600 MG tablet Take 600 mg by mouth 3 (three) times daily as needed (nerve pain). 07/23/21   [provider]  GLOBAL EASE INJECT PEN NEEDLES 32G X 4 MM MISC  08/27/21   [provider]  HUMALOG KWIKPEN 100 UNIT/ML KwikPen Inject 6 Units into the skin in the morning, at noon, in the evening, and at bedtime. 12/09/21   [provider]  ketorolac (ACULAR) 0.5 % ophthalmic solution Place 1 drop into both eyes 4 (four) times daily as needed (dry eyes). 09/06/21   [provider]  pantoprazole (PROTONIX) 40 MG tablet Take 40 mg by mouth daily as needed (heartburn). 05/27/21   [provider]  pravastatin (PRAVACHOL) 20 MG tablet Take 1 tablet (20 mg total) by mouth at bedtime. Take after completion of daptomycin 01/03/22   [name], [name], MD  sertraline (ZOLOFT) 100 MG tablet Take 2 tablets (200 mg total) by mouth daily. 06/08/22 07/08/22  Ariel Dike, MD  spironolactone (ALDACTONE) 50 MG tablet Take 50 mg by mouth daily as needed (swelling). 05/27/21   [provider]  TOUJEO MAX SOLOSTAR 300 UNIT/ML Solostar Pen Inject 60 Units into the skin at bedtime. 06/03/21   [provider]  vitamin C (ASCORBIC ACID) 500 MG  tablet Take 500 mg by mouth 2 (two) times daily.    [provider]  Vitamin D, Ergocalciferol, (DRISDOL) 1.25 MG (50000 UNIT) CAPS capsule Take 1 capsule by mouth every 7 (seven) days. Mondays 04/05/21   [provider]      Allergies    Chlorhexidine and Tape    Review of Systems   Review of Systems  Skin:  Positive for wound.  All other systems reviewed and are negative.   Physical Exam Updated Vital Signs BP (!) 110/50   Pulse 73   Temp 98 F (36.7 C) (Oral)   Resp 20   Ht 5' 7" (1.702 m)   Wt 79.8 kg   SpO2 98%    BMI 27.57 kg/m  Physical Exam Vitals and nursing note reviewed.  Constitutional:      Appearance: Normal appearance.  HENT:     Head: Normocephalic and atraumatic.     Right Ear: External ear normal.     Left Ear: External ear normal.     Nose: Nose normal.     Mouth/Throat:     Mouth: Mucous membranes are moist.     Pharynx: Oropharynx is clear.  Eyes:     Extraocular Movements: Extraocular movements intact.     Conjunctiva/sclera: Conjunctivae normal.     Pupils: Pupils are equal, round, and reactive to light.  Cardiovascular:     Rate and Rhythm: Normal rate and regular rhythm.     Pulses: Normal pulses.     Heart sounds: Normal heart sounds.  Pulmonary:     Effort: Pulmonary effort is normal.     Breath sounds: Normal breath sounds.  Abdominal:     General: Abdomen is flat. Bowel sounds are normal.     Palpations: Abdomen is soft.  Musculoskeletal:        General: Normal range of motion.     Cervical back: Normal range of motion and neck supple.  Skin:    General: Skin is warm.     Capillary Refill: Capillary refill takes less than 2 seconds.  Neurological:     General: No focal deficit present.     Mental Status: She is alert and oriented to person, place, and time.  Psychiatric:        Mood and Affect: Mood normal.        Behavior: Behavior normal.     ED Results / Procedures / Treatments   Labs (all labs ordered are listed, but only abnormal results are displayed) Labs Reviewed  CBC WITH DIFFERENTIAL/PLATELET  COMPREHENSIVE METABOLIC PANEL  LACTIC ACID, PLASMA  LACTIC ACID, PLASMA  SEDIMENTATION RATE  C-REACTIVE PROTEIN  URINALYSIS, ROUTINE W REFLEX MICROSCOPIC  I-STAT CHEM 8, ED    EKG None  Radiology DG Foot Complete Left  Result Date: 08/19/2022 CLINICAL DATA:  Concern for osteomyelitis. EXAM: LEFT FOOT - COMPLETE 3+ VIEW COMPARISON:  Radiographs dated June 04, 2022 FINDINGS: Postsurgical changes for prior transmetatarsal amputation. There is  osseous erosions about the second through fifth residual metatarsals most prominent about the fourth and fifth metatarsal concerning for osteomyelitis. Marked soft tissue swelling about the dorsum of the foot. IMPRESSION: Cortical erosions and periosteal reaction about second through fifth residual metatarsals most prominent about the fourth and fifth metatarsals consistent with acute osteomyelitis. Marked soft tissue swelling about the prior amputation site. Electronically Signed   By: Keane Police D.O.   On: 08/19/2022 22:28    Procedures Procedures    Medications Ordered in ED Medications  morphine (PF) 4 MG/ML injection 4 mg (has no administration in time range)  ondansetron (ZOFRAN) injection 4 mg (has no administration in time range)    ED Course/ Medical Decision Making/ A&P                           Medical Decision Making Amount and/or Complexity of Data Reviewed Radiology: ordered.  Risk Prescription drug management.   This patient presents to the ED for concern of foot infection, this involves an extensive number of treatment options, and is a complaint that carries with it a high risk of complications and morbidity.  The differential diagnosis includes osteomyelitis, infection,    Co morbidities that complicate the patient evaluation  anemia, liver cirrhosis secondary to NASH, DM2, HTN, GI bleed, osteomyelitis, PVD, anxiety, depression and recurrent left foot infection   Additional history obtained:  Additional history obtained from epic chart review External records from outside source obtained and reviewed including daughter   Lab Tests:  I Ordered, and personally interpreted labs.  Labs are pending at shift change.   Imaging Studies ordered:  I ordered imaging studies including foot  I independently visualized and interpreted imaging which showed  IMPRESSION:  Cortical erosions and periosteal reaction about second through fifth  residual metatarsals most  prominent about the fourth and fifth  metatarsals consistent with acute osteomyelitis.    Marked soft tissue swelling about the prior amputation site.   I agree with the radiologist interpretation   Cardiac Monitoring:  The patient was maintained on a cardiac monitor.  I personally viewed and interpreted the cardiac monitored which showed an underlying rhythm of: nsr   Medicines ordered and prescription drug management:  I ordered medication including abx  for osteo  Reevaluation of the patient after these medicines showed that the patient improved I have reviewed the patients home medicines and have made adjustments as needed    Consultations Obtained:  I requested consultation with the podiatry, but have not heard back   Problem List / ED Course:  Osteomyelitis:  Pt will need admission for iv abx and likely surgery.  Pt signed out to Dr. Christy Gentles at shift change.   Reevaluation:  After the interventions noted above, I reevaluated the patient and found that they have :improved   Social Determinants of Health:  Lives at home   Dispostion:  After consideration of the diagnostic results and the patients response to treatment, I feel that the patent would benefit from admission.          Final Clinical Impression(s) / ED Diagnoses Final diagnoses:  None    Rx / DC Orders ED Discharge Orders     None         Isla Pence, MD 08/19/22 2335

## 2022-08-20 DIAGNOSIS — K7682 Hepatic encephalopathy: Secondary | ICD-10-CM | POA: Diagnosis not present

## 2022-08-20 DIAGNOSIS — E43 Unspecified severe protein-calorie malnutrition: Secondary | ICD-10-CM | POA: Diagnosis present

## 2022-08-20 DIAGNOSIS — L089 Local infection of the skin and subcutaneous tissue, unspecified: Secondary | ICD-10-CM | POA: Diagnosis present

## 2022-08-20 DIAGNOSIS — E1152 Type 2 diabetes mellitus with diabetic peripheral angiopathy with gangrene: Secondary | ICD-10-CM | POA: Diagnosis present

## 2022-08-20 DIAGNOSIS — K7460 Unspecified cirrhosis of liver: Secondary | ICD-10-CM | POA: Diagnosis present

## 2022-08-20 DIAGNOSIS — M86672 Other chronic osteomyelitis, left ankle and foot: Secondary | ICD-10-CM

## 2022-08-20 DIAGNOSIS — E872 Acidosis, unspecified: Secondary | ICD-10-CM | POA: Diagnosis present

## 2022-08-20 DIAGNOSIS — T148XXA Other injury of unspecified body region, initial encounter: Secondary | ICD-10-CM

## 2022-08-20 DIAGNOSIS — I5032 Chronic diastolic (congestive) heart failure: Secondary | ICD-10-CM | POA: Diagnosis present

## 2022-08-20 DIAGNOSIS — E1169 Type 2 diabetes mellitus with other specified complication: Secondary | ICD-10-CM | POA: Diagnosis present

## 2022-08-20 DIAGNOSIS — E7220 Disorder of urea cycle metabolism, unspecified: Secondary | ICD-10-CM | POA: Diagnosis not present

## 2022-08-20 DIAGNOSIS — E1122 Type 2 diabetes mellitus with diabetic chronic kidney disease: Secondary | ICD-10-CM | POA: Diagnosis present

## 2022-08-20 DIAGNOSIS — E119 Type 2 diabetes mellitus without complications: Secondary | ICD-10-CM | POA: Diagnosis not present

## 2022-08-20 DIAGNOSIS — Y835 Amputation of limb(s) as the cause of abnormal reaction of the patient, or of later complication, without mention of misadventure at the time of the procedure: Secondary | ICD-10-CM | POA: Diagnosis present

## 2022-08-20 DIAGNOSIS — M869 Osteomyelitis, unspecified: Principal | ICD-10-CM

## 2022-08-20 DIAGNOSIS — I700 Atherosclerosis of aorta: Secondary | ICD-10-CM | POA: Diagnosis present

## 2022-08-20 DIAGNOSIS — Z89422 Acquired absence of other left toe(s): Secondary | ICD-10-CM | POA: Diagnosis not present

## 2022-08-20 DIAGNOSIS — F32A Depression, unspecified: Secondary | ICD-10-CM | POA: Diagnosis present

## 2022-08-20 DIAGNOSIS — K7581 Nonalcoholic steatohepatitis (NASH): Secondary | ICD-10-CM | POA: Diagnosis present

## 2022-08-20 DIAGNOSIS — N182 Chronic kidney disease, stage 2 (mild): Secondary | ICD-10-CM | POA: Diagnosis present

## 2022-08-20 DIAGNOSIS — N189 Chronic kidney disease, unspecified: Secondary | ICD-10-CM | POA: Diagnosis not present

## 2022-08-20 DIAGNOSIS — T8141XA Infection following a procedure, superficial incisional surgical site, initial encounter: Secondary | ICD-10-CM | POA: Diagnosis present

## 2022-08-20 DIAGNOSIS — E1142 Type 2 diabetes mellitus with diabetic polyneuropathy: Secondary | ICD-10-CM | POA: Diagnosis present

## 2022-08-20 DIAGNOSIS — D631 Anemia in chronic kidney disease: Secondary | ICD-10-CM | POA: Diagnosis present

## 2022-08-20 DIAGNOSIS — T8781 Dehiscence of amputation stump: Secondary | ICD-10-CM | POA: Diagnosis present

## 2022-08-20 DIAGNOSIS — Z794 Long term (current) use of insulin: Secondary | ICD-10-CM | POA: Diagnosis not present

## 2022-08-20 DIAGNOSIS — E782 Mixed hyperlipidemia: Secondary | ICD-10-CM | POA: Diagnosis present

## 2022-08-20 DIAGNOSIS — M86172 Other acute osteomyelitis, left ankle and foot: Secondary | ICD-10-CM | POA: Diagnosis present

## 2022-08-20 DIAGNOSIS — I10 Essential (primary) hypertension: Secondary | ICD-10-CM | POA: Diagnosis not present

## 2022-08-20 DIAGNOSIS — I130 Hypertensive heart and chronic kidney disease with heart failure and stage 1 through stage 4 chronic kidney disease, or unspecified chronic kidney disease: Secondary | ICD-10-CM | POA: Diagnosis present

## 2022-08-20 DIAGNOSIS — D61818 Other pancytopenia: Secondary | ICD-10-CM | POA: Diagnosis present

## 2022-08-20 DIAGNOSIS — D696 Thrombocytopenia, unspecified: Secondary | ICD-10-CM | POA: Diagnosis not present

## 2022-08-20 DIAGNOSIS — I96 Gangrene, not elsewhere classified: Secondary | ICD-10-CM | POA: Diagnosis present

## 2022-08-20 LAB — CBC WITH DIFFERENTIAL/PLATELET
Abs Immature Granulocytes: 0.01 10*3/uL (ref 0.00–0.07)
Basophils Absolute: 0 10*3/uL (ref 0.0–0.1)
Basophils Relative: 0 %
Eosinophils Absolute: 0.2 10*3/uL (ref 0.0–0.5)
Eosinophils Relative: 8 %
HCT: 26.4 % — ABNORMAL LOW (ref 36.0–46.0)
Hemoglobin: 8.7 g/dL — ABNORMAL LOW (ref 12.0–15.0)
Immature Granulocytes: 0 %
Lymphocytes Relative: 26 %
Lymphs Abs: 0.7 10*3/uL (ref 0.7–4.0)
MCH: 32.2 pg (ref 26.0–34.0)
MCHC: 33 g/dL (ref 30.0–36.0)
MCV: 97.8 fL (ref 80.0–100.0)
Monocytes Absolute: 0.2 10*3/uL (ref 0.1–1.0)
Monocytes Relative: 8 %
Neutro Abs: 1.6 10*3/uL — ABNORMAL LOW (ref 1.7–7.7)
Neutrophils Relative %: 58 %
Platelets: 91 10*3/uL — ABNORMAL LOW (ref 150–400)
RBC: 2.7 MIL/uL — ABNORMAL LOW (ref 3.87–5.11)
RDW: 14.4 % (ref 11.5–15.5)
WBC: 2.7 10*3/uL — ABNORMAL LOW (ref 4.0–10.5)
nRBC: 0 % (ref 0.0–0.2)

## 2022-08-20 LAB — COMPREHENSIVE METABOLIC PANEL
ALT: 16 U/L (ref 0–44)
AST: 20 U/L (ref 15–41)
Albumin: 2.4 g/dL — ABNORMAL LOW (ref 3.5–5.0)
Alkaline Phosphatase: 104 U/L (ref 38–126)
Anion gap: 5 (ref 5–15)
BUN: 22 mg/dL (ref 8–23)
CO2: 25 mmol/L (ref 22–32)
Calcium: 8.1 mg/dL — ABNORMAL LOW (ref 8.9–10.3)
Chloride: 109 mmol/L (ref 98–111)
Creatinine, Ser: 0.93 mg/dL (ref 0.44–1.00)
GFR, Estimated: >60 mL/min (ref >60)
Glucose, Bld: 324 mg/dL — ABNORMAL HIGH (ref 70–99)
Potassium: 3.4 mmol/L — ABNORMAL LOW (ref 3.5–5.1)
Sodium: 139 mmol/L (ref 135–145)
Total Bilirubin: 1.2 mg/dL (ref 0.3–1.2)
Total Protein: 6.5 g/dL (ref 6.5–8.1)

## 2022-08-20 LAB — C-REACTIVE PROTEIN: CRP: 6 mg/dL — ABNORMAL HIGH (ref <1.0)

## 2022-08-20 LAB — SURGICAL PCR SCREEN
MRSA, PCR: POSITIVE — AB
Staphylococcus aureus: POSITIVE — AB

## 2022-08-20 LAB — RETICULOCYTES
Immature Retic Fract: 12.6 % (ref 2.3–15.9)
RBC.: 2.7 MIL/uL — ABNORMAL LOW (ref 3.87–5.11)
Retic Count, Absolute: 73.2 10*3/uL (ref 19.0–186.0)
Retic Ct Pct: 2.7 % (ref 0.4–3.1)

## 2022-08-20 LAB — GLUCOSE, CAPILLARY
Glucose-Capillary: 315 mg/dL — ABNORMAL HIGH (ref 70–99)
Glucose-Capillary: 258 mg/dL — ABNORMAL HIGH (ref 70–99)
Glucose-Capillary: 154 mg/dL — ABNORMAL HIGH (ref 70–99)
Glucose-Capillary: 126 mg/dL — ABNORMAL HIGH (ref 70–99)
Glucose-Capillary: 203 mg/dL — ABNORMAL HIGH (ref 70–99)

## 2022-08-20 LAB — LACTIC ACID, PLASMA: Lactic Acid, Venous: 1.8 mmol/L (ref 0.5–1.9)

## 2022-08-20 MED ORDER — INSULIN GLARGINE-YFGN 100 UNIT/ML ~~LOC~~ SOLN
20.0000 [IU] | Freq: Every day | SUBCUTANEOUS | Status: DC
  Administered 2022-08-20 – 2022-08-30 (×11): 20 [IU] via SUBCUTANEOUS
  Filled 2022-08-20 (×13): qty 0.2

## 2022-08-20 MED ORDER — MUPIROCIN 2 % EX OINT
1.0000 | TOPICAL_OINTMENT | Freq: Two times a day (BID) | CUTANEOUS | Status: AC
  Administered 2022-08-20 – 2022-08-25 (×10): 1 via NASAL
  Filled 2022-08-20 (×4): qty 22

## 2022-08-20 MED ORDER — SODIUM CHLORIDE 0.9 % IV SOLN
2.0000 g | Freq: Three times a day (TID) | INTRAVENOUS | Status: DC
  Administered 2022-08-20 – 2022-08-24 (×13): 2 g via INTRAVENOUS
  Filled 2022-08-20 (×15): qty 12.5

## 2022-08-20 MED ORDER — ONDANSETRON HCL 4 MG PO TABS: 4.0000 mg | ORAL_TABLET | Freq: Four times a day (QID) | ORAL | Status: DC | PRN

## 2022-08-20 MED ORDER — CHLORHEXIDINE GLUCONATE CLOTH 2 % EX PADS
6.0000 | MEDICATED_PAD | Freq: Every day | CUTANEOUS | Status: AC
  Administered 2022-08-21 – 2022-08-25 (×5): 6 via TOPICAL

## 2022-08-20 MED ORDER — INSULIN ASPART 100 UNIT/ML IJ SOLN
0.0000 [IU] | Freq: Every day | INTRAMUSCULAR | Status: DC
  Administered 2022-08-20: 2 [IU] via SUBCUTANEOUS

## 2022-08-20 MED ORDER — SODIUM CHLORIDE 0.9 % IV SOLN: INTRAVENOUS | Status: DC

## 2022-08-20 MED ORDER — ENOXAPARIN SODIUM 40 MG/0.4ML IJ SOSY
40.0000 mg | PREFILLED_SYRINGE | INTRAMUSCULAR | Status: DC
  Administered 2022-08-20 – 2022-08-27 (×7): 40 mg via SUBCUTANEOUS
  Filled 2022-08-20 (×7): qty 0.4

## 2022-08-20 MED ORDER — VANCOMYCIN HCL 1500 MG/300ML IV SOLN
1500.0000 mg | Freq: Once | INTRAVENOUS | Status: AC
  Administered 2022-08-20: 1500 mg via INTRAVENOUS
  Filled 2022-08-20: qty 300

## 2022-08-20 MED ORDER — INSULIN ASPART 100 UNIT/ML IJ SOLN
0.0000 [IU] | Freq: Three times a day (TID) | INTRAMUSCULAR | Status: DC
  Administered 2022-08-20: 4 [IU] via SUBCUTANEOUS
  Administered 2022-08-20: 11 [IU] via SUBCUTANEOUS
  Administered 2022-08-20: 3 [IU] via SUBCUTANEOUS
  Administered 2022-08-21: 15 [IU] via SUBCUTANEOUS
  Administered 2022-08-21: 3 [IU] via SUBCUTANEOUS
  Administered 2022-08-22 – 2022-08-24 (×7): 4 [IU] via SUBCUTANEOUS
  Administered 2022-08-25: 7 [IU] via SUBCUTANEOUS
  Administered 2022-08-25 – 2022-08-27 (×3): 4 [IU] via SUBCUTANEOUS
  Administered 2022-08-27: 7 [IU] via SUBCUTANEOUS
  Administered 2022-08-27: 3 [IU] via SUBCUTANEOUS
  Administered 2022-08-28: 4 [IU] via SUBCUTANEOUS
  Administered 2022-08-28: 3 [IU] via SUBCUTANEOUS
  Administered 2022-08-29: 4 [IU] via SUBCUTANEOUS
  Administered 2022-08-29 (×2): 3 [IU] via SUBCUTANEOUS
  Administered 2022-08-30: 4 [IU] via SUBCUTANEOUS
  Administered 2022-08-30: 7 [IU] via SUBCUTANEOUS
  Administered 2022-08-31: 3 [IU] via SUBCUTANEOUS
  Administered 2022-08-31: 4 [IU] via SUBCUTANEOUS

## 2022-08-20 MED ORDER — VANCOMYCIN HCL 1500 MG/300ML IV SOLN
1500.0000 mg | INTRAVENOUS | Status: AC
  Administered 2022-08-21 – 2022-08-27 (×7): 1500 mg via INTRAVENOUS
  Filled 2022-08-20 (×7): qty 300

## 2022-08-20 MED ORDER — ONDANSETRON HCL 4 MG/2ML IJ SOLN
4.0000 mg | Freq: Four times a day (QID) | INTRAMUSCULAR | Status: DC | PRN
  Filled 2022-08-20: qty 2

## 2022-08-20 MED ORDER — MORPHINE SULFATE (PF) 2 MG/ML IV SOLN
2.0000 mg | INTRAVENOUS | Status: DC | PRN
  Administered 2022-08-20 – 2022-08-28 (×8): 2 mg via INTRAVENOUS
  Filled 2022-08-20 (×8): qty 1

## 2022-08-20 NOTE — H&P (Signed)
History and Physical    Patient: Ariel Arnold QZE:[id number] DOB: Apr 11, 1956 DOA: 08/19/2022 DOS: the patient was seen and examined on 08/20/2022 PCP: Jacqualine Code, DO  Patient coming from: Home  Chief Complaint:  Chief Complaint  Patient presents with   Multiple Complaints   HPI: Ariel Arnold is a 66 y.o. female with medical history significant of diabetes, essential hypertension, history of AVMs, hepatic encephalopathy, [name] cirrhosis, essential hypertension, previous history of MRSA, peripheral vascular disease, pancytopenia and hyperlipidemia with depression who previously had transmetatarsal amputation by Dr. Blenda Mounts on April 05, 2022 due to osteomyelitis.  Patient was admitted here on June 15th with wound infection.  Cultures grew MRSA.  Was treated aggressively and managed.  She was discharged to follow-up with ID and podiatry.  She apparently has been doing well and her last visit with podiatrist was 2 weeks ago.  During that visit she was told that the wound looks okay.  Patient however presented to the ER today with significant wound dehiscence.  The wound is infected with foul-smelling drainage.  Patient is being admitted with infected surgical wound.  Blood sugar is stable.  No fever or chills.  Denied any trauma.  Review of Systems: As mentioned in the history of present illness. All other systems reviewed and are negative. Past Medical History:  Diagnosis Date   Anemia    Anxiety    Aortic atherosclerosis (HCC)    Arthritis    AVM (arteriovenous malformation) of colon    Depression    Essential hypertension    GI bleeding    Recurrent   Hepatic encephalopathy ([place])    History of kidney stones    History of RSV infection    Liver cirrhosis secondary to NASH ([place]) 2004   Biopsy-proven   Obesity    Osteomyelitis ([place])    Peripheral vascular disease (HCC)    Thrombocytopenia ([place])    Type 2 diabetes mellitus ([place])    Past Surgical History:  Procedure  Laterality Date   ABDOMINAL HYSTERECTOMY     AMPUTATION Left 04/05/2022   Procedure: Metatarsal amputation revision;  Surgeon: Lorenda Peck, MD;  Location: [facility];  Service: Podiatry;  Laterality: Left;  Surgical team to do block   COLONOSCOPY     Cystoscopy with ureteral stent     ESOPHAGOGASTRODUODENOSCOPY     ESOPHAGOGASTRODUODENOSCOPY (EGD) WITH PROPOFOL N/A 05/20/2021   Procedure: ESOPHAGOGASTRODUODENOSCOPY (EGD) WITH PROPOFOL;  Surgeon: Daneil Dolin, MD;  Location: [facility];  Service: Endoscopy;  Laterality: N/A;  needs intubation   IR ANGIOGRAM FOLLOW UP STUDY  05/21/2021   IR ANGIOGRAM SELECTIVE EACH ADDITIONAL VESSEL  05/21/2021   IR EMBO VENOUS NOT HEMORR HEMANG  INC GUIDE ROADMAPPING  05/21/2021   IR US GUIDE VASC ACCESS RIGHT  05/21/2021   IR VENOGRAM RENAL UNI LEFT  05/21/2021   IRRIGATION AND DEBRIDEMENT FOOT Left 06/04/2022   Procedure: REVISIONAL TRAMSMETATARSAL AMPUTATION;  Surgeon: Felipa Furnace, DPM;  Location: [facility];  Service: Podiatry;  Laterality: Left;   RADIOLOGY WITH ANESTHESIA N/A 05/21/2021   Procedure: IR WITH ANESTHESIA;  Surgeon: Radiologist, Medication, MD;  Location: [name];  Service: Radiology;  Laterality: N/A;   Toe amputations Bilateral    TUBAL LIGATION     WOUND DEBRIDEMENT Left 11/12/2021   Procedure: DEBRIDEMENT WOUND;FIRST METATARSAL RESECTION; APPLICATION OF SKIN GRAFT SUBSTITUTE;  Surgeon: Evelina Bucy, DPM;  Location: [facility];  Service: Podiatry;  Laterality: Left;   Social History:  reports that she  has quit smoking. Her smoking use included cigarettes. She has never used smokeless tobacco. She reports current alcohol use. She reports that she does not use drugs.  Allergies  Allergen Reactions   Chlorhexidine Rash   Tape Rash    Family History  Problem Relation Age of Onset   Heart failure Mother    Hyperlipidemia Mother    Diabetes Mellitus II Mother    Heart failure Father    Hyperlipidemia Father     Prior to Admission medications    Medication Sig Start Date End Date Taking? Authorizing Provider  ACCU-CHEK GUIDE test strip  08/27/21   [provider]  Accu-Chek Softclix Lancets lancets SMARTSIG:2 Topical Twice Daily 08/27/21   [provider]  albuterol (VENTOLIN HFA) 108 (90 Base) MCG/ACT inhaler Inhale 2 puffs into the lungs every 4 (four) hours as needed for shortness of breath or wheezing. 01/11/21   [provider]  Blood Glucose Monitoring Suppl (ACCU-CHEK GUIDE) w/Device KIT  08/27/21   [provider]  Calcium Carbonate-Vit D-Min (CALCIUM 1200) 1200-1000 MG-UNIT CHEW Chew 2 tablets by mouth daily.    [provider]  carvedilol (COREG) 6.25 MG tablet Take 1 tablet (6.25 mg total) by mouth 2 (two) times daily. 04/11/22 06/03/22  [name], MD  clotrimazole-betamethasone (LOTRISONE) cream Apply topically 2 (two) times daily. 11/25/21   [provider]  diclofenac Sodium (VOLTAREN) 1 % GEL Apply 2 g topically in the morning and at bedtime. 12/02/21   [provider]  ferrous sulfate 325 (65 FE) MG tablet Take 325 mg by mouth in the morning and at bedtime.    [provider]  folic acid (FOLVITE) 1 MG tablet Take 1 mg by mouth 2 (two) times daily. 06/02/21   [provider]  furosemide (LASIX) 20 MG tablet Take 20 mg by mouth daily as needed for fluid. 05/27/21   [provider]  gabapentin (NEURONTIN) 600 MG tablet Take 600 mg by mouth 3 (three) times daily as needed (nerve pain). 07/23/21   [provider]  GLOBAL EASE INJECT PEN NEEDLES 32G X 4 MM MISC  08/27/21   [provider]  HUMALOG KWIKPEN 100 UNIT/ML KwikPen Inject 6 Units into the skin in the morning, at noon, in the evening, and at bedtime. 12/09/21   [provider]  ketorolac (ACULAR) 0.5 % ophthalmic solution Place 1 drop into both eyes 4 (four) times daily as needed (dry eyes). 09/06/21   [provider]  pantoprazole (PROTONIX) 40 MG tablet  Take 40 mg by mouth daily as needed (heartburn). 05/27/21   [provider]  pravastatin (PRAVACHOL) 20 MG tablet Take 1 tablet (20 mg total) by mouth at bedtime. Take after completion of daptomycin 01/03/22   [name], [name], MD  sertraline (ZOLOFT) 100 MG tablet Take 2 tablets (200 mg total) by mouth daily. 06/08/22 07/08/22  [name], MD  spironolactone (ALDACTONE) 50 MG tablet Take 50 mg by mouth daily as needed (swelling). 05/27/21   [provider]  TOUJEO MAX SOLOSTAR 300 UNIT/ML Solostar Pen Inject 60 Units into the skin at bedtime. 06/03/21   [provider]  vitamin C (ASCORBIC ACID) 500 MG tablet Take 500 mg by mouth 2 (two) times daily.    [provider]  Vitamin D, Ergocalciferol, (DRISDOL) 1.25 MG (50000 UNIT) CAPS capsule Take 1 capsule by mouth every 7 (seven) days. Mondays 04/05/21   [provider]    Physical Exam: Vitals:   08/19/22  2245 08/19/22 2300 08/19/22 2330 08/20/22 0000  BP: (!) 110/50 (!) 124/59 (!) 127/54 107/67  Pulse: 73 73 83 84  Resp: _0 Temp:      TempSrc:      SpO2: 98% 98% 97% 97%  Weight:      Height:       General: Acutely ill looking: Weak, no distress, fully awake HEENT: PERRL, EOMI, no pallor or jaundice Neck: Supple, no JVD no lymphadenopathy Respiratory: Good air entry bilaterally no wheeze rales or crackles Cardiovascular system: Regular rate and rhythm Abdomen: Soft, nontender with positive bowel sounds Extremities: Left foot stump currently draining fluid with obvious wound dehiscence right foot intact Musculoskeletal: Status post left forefoot amputation.  No other joint deformities Neuro: Nonfocal at the moment.  Patient fully awake and alert. Skin exam: No other ulcers.  Data Reviewed:   Lactic acid 2.2, CBC currently pending, glucose 173, BUN 28 creatinine 0.70, alkaline phosphatase 132 albumin 2.8 total bili 1.6 x-ray of the foot showed cortical erosions and the periosteal  reaction about the second through fifth residual metatarsals consistent with acute osteomyelitis  Assessment and Plan:  #1 infected left wound with acute osteomyelitis: Patient will be admitted to the hospital.  Initiate IV cefepime and vancomycin.  Previous cultures have shown MRSA.  We will obtain wound culture again.  Blood cultures.  Continue with other supportive care.  #2 NASH cirrhosis: Stable at baseline.  Continue monitoring.  No evidence of hepatic encephalopathy.  #3 essential hypertension: Continue blood pressure monitoring.  #4 type 2 diabetes: Continue with blood sugar monitoring and sliding scale insulin.  #5 chronic kidney disease stage II: Stable at baseline  #6 mixed hyperlipidemia: Continue statin  #7 GERD: Continue with PPIs    Advance Care Planning:   Code Status: Prior full code  Consults: Consult Dr. Blenda Mounts in the morning  Family Communication: Granddaughter at bedside  Severity of Illness: The appropriate patient status for this patient is INPATIENT. Inpatient status is judged to be reasonable and necessary in order to provide the required intensity of service to ensure the patient's safety. The patient's presenting symptoms, physical exam findings, and initial radiographic and laboratory data in the context of their chronic comorbidities is felt to place them at high risk for further clinical deterioration. Furthermore, it is not anticipated that the patient will be medically stable for discharge from the hospital within 2 midnights of admission.   * I certify that at the point of admission it is my clinical judgment that the patient will require inpatient hospital care spanning beyond 2 midnights from the point of admission due to high intensity of service, high risk for further deterioration and high frequency of surveillance required.*  AuthorBarbette Merino, MD 08/20/2022 12:10 AM  For on call review [email or web address].

## 2022-08-20 NOTE — Progress Notes (Signed)
PHARMACY NOTE:  ANTIMICROBIAL RENAL DOSAGE ADJUSTMENT  Current antimicrobial regimen includes a mismatch between antimicrobial dosage and estimated renal function.  As per policy approved by the Pharmacy & Therapeutics and Medical Executive Committees, the antimicrobial dosage will be adjusted accordingly.  Current antimicrobial dosage:  Cefepime 1gm IV q8h  Indication: wound infection  Renal Function:  Estimated Creatinine Clearance: 73.2 mL/min (by C-G formula based on SCr of 0.7 mg/dL).      Antimicrobial dosage has been changed to:  Cefepime 2gm IV q8h   Thank you for allowing pharmacy to be a part of this patient's care.  Everette Rank, [facility] 08/20/2022 12:34 AM

## 2022-08-20 NOTE — Progress Notes (Signed)
Patient discussed with hospitalist.  Chronic L foot infections treated by podiatry.  Currently stable and not septic.  Consult requested for consideration for possible transtibial amputation.  Full consult to follow.  Georgeanna Harrison M.D. Orthopaedic Surgery [facility]

## 2022-08-20 NOTE — Consult Note (Signed)
Orthopaedic Consult  Date/Time: 08/20/22 2:05 PM  Patient Name: Ariel Arnold  Attending Physician: Caren Griffins, MD    ASSESSMENT & PLAN  Orthopaedic Assessment: 66 y.o. female with chronic left foot infections and osteomyelitis, most recently s/p TMA now with dehiscence.  Clinically stable without acute infectious concerns involving left foot at this time.  Plan: Patient was discussed with Dr. Meridee Score, MD.  He has kindly agreed to see and evaluate the patient this coming Monday.  Patient is currently clinically stable without acute infectious concerns involving left foot, and no acute operative interventions are required prior to his evaluation.  Remainder of care per primary team.   Georgeanna Harrison M.D. Orthopaedic Surgery [facility]   Medical Decision Making  Amount/complexity of data: Is there a current pathologic fracture (e.g. neoplastic, osteoporotic insufficiency fracture)? No Independent interpretation of radiographic studies: Yes Review of radiology results (e.g. reports): Yes Tests ordered (e.g. additional radiographic studies, labs): No Lab results reviewed: Yes Reviewed old records: Yes History from another source (independent historian, e.g. family/friend/etc.): No Discussion of imaging, clinical data, and or management with independent medical provider: Yes Risk: Patient receiving IV controlled substances for pain: Yes Fracture requiring manipulation: No Urgent or emergent (non-elective) surgery likely this admission: Yes Presence of medical comorbidities and/or surgical risk factors (e.g. current smoker, CAD, diabetes, COPD, CKD, etc.): Yes Closed fracture management WITHOUT manipulation: No Urgent minor procedure (e.g. joint aspiration, compartment pressure measurement, etc.): No Will likely need surgery as an outpatient: No     HPI Ariel Arnold is a 66 y.o. female. Orthopaedic consultation has specifically been  requested to address this patient's current musculoskeletal presentation.  She has a longstanding history of at least 4 years of infections involving the left foot.  This been managed by wound care and podiatry with debridements and most recently with transmetatarsal amputation.  Unfortunately she has experienced persistence and recurrence of infection with dehiscence of the TMA.  She has multiple medical comorbidities and neuropathy involving all 4 extremities.  She is interested in a more definitive treatment for this problem.   PMH Past Medical History:  Diagnosis Date   Anemia    Anxiety    Aortic atherosclerosis ([place])    Arthritis    AVM (arteriovenous malformation) of colon    Depression    Essential hypertension    GI bleeding    Recurrent   Hepatic encephalopathy ([place])    History of kidney stones    History of RSV infection    Liver cirrhosis secondary to NASH ([place]) 2004   Biopsy-proven   Obesity    Osteomyelitis ([place])    Peripheral vascular disease ([facility])    Thrombocytopenia ([place])    Type 2 diabetes mellitus ([place])      [place] Past Surgical History:  Procedure Laterality Date   ABDOMINAL HYSTERECTOMY     AMPUTATION Left 04/05/2022   Procedure: Metatarsal amputation revision;  Surgeon: Lorenda Peck, MD;  [facility];  Service: Podiatry;  Laterality: Left;  Surgical team to do block   COLONOSCOPY     Cystoscopy with ureteral stent     ESOPHAGOGASTRODUODENOSCOPY     ESOPHAGOGASTRODUODENOSCOPY (EGD) WITH PROPOFOL N/A 05/20/2021   Procedure: ESOPHAGOGASTRODUODENOSCOPY (EGD) WITH PROPOFOL;  Surgeon: Daneil Dolin, MD;  [facility];  Service: Endoscopy;  Laterality: N/A;  needs intubation   IR ANGIOGRAM FOLLOW UP STUDY  05/21/2021   IR ANGIOGRAM SELECTIVE EACH ADDITIONAL VESSEL  05/21/2021   IR  EMBO VENOUS NOT HEMORR HEMANG  INC GUIDE ROADMAPPING  05/21/2021   IR US GUIDE VASC ACCESS RIGHT  05/21/2021   IR VENOGRAM RENAL UNI LEFT  05/21/2021   IRRIGATION AND  DEBRIDEMENT FOOT Left 06/04/2022   Procedure: REVISIONAL TRAMSMETATARSAL AMPUTATION;  Surgeon: Felipa Furnace, DPM;  [facility];  Service: Podiatry;  Laterality: Left;   RADIOLOGY WITH ANESTHESIA N/A 05/21/2021   Procedure: IR WITH ANESTHESIA;  Surgeon: Radiologist, Medication, MD;  Location: [place];  Service: Radiology;  Laterality: N/A;   Toe amputations Bilateral    TUBAL LIGATION     WOUND DEBRIDEMENT Left 11/12/2021   Procedure: DEBRIDEMENT WOUND;FIRST METATARSAL RESECTION; APPLICATION OF SKIN GRAFT SUBSTITUTE;  Surgeon: Evelina Bucy, DPM;  [facility];  Service: Podiatry;  Laterality: Left;   Home Medications Prior to Admission medications   Medication Sig Start Date End Date Taking? Authorizing Provider  albuterol (VENTOLIN HFA) 108 (90 Base) MCG/ACT inhaler Inhale 2 puffs into the lungs every 4 (four) hours as needed for shortness of breath or wheezing. 01/11/21  Yes [provider]  Calcium Carbonate-Vit D-Min (CALCIUM 1200) 1200-1000 MG-UNIT CHEW Chew 2 tablets by mouth daily.   Yes [provider]  carvedilol (COREG) 6.25 MG tablet Take 1 tablet (6.25 mg total) by mouth 2 (two) times daily. 04/11/22 08/21/23 Yes [name], MD  clotrimazole-betamethasone (LOTRISONE) cream Apply 1 Application topically daily as needed (for yeast/rash). 11/25/21  Yes [provider]  diclofenac Sodium (VOLTAREN) 1 % GEL Apply 2 g topically in the morning and at bedtime. 12/02/21  Yes [provider]  ferrous sulfate 325 (65 FE) MG tablet Take 325 mg by mouth in the morning and at bedtime.   Yes [provider]  folic acid (FOLVITE) 1 MG tablet Take 1 mg by mouth 2 (two) times daily. 06/02/21  Yes [provider]  furosemide (LASIX) 20 MG tablet Take 20 mg by mouth once a week. 05/27/21  Yes [provider]  HUMALOG KWIKPEN 100 UNIT/ML KwikPen Inject 8 Units into the skin in the morning, at noon, in the evening, and at bedtime. Per  sliding scale 12/09/21  Yes [provider]  hydrOXYzine (ATARAX) 25 MG tablet Take 25 mg by mouth daily. 08/17/22  Yes [provider]  ketorolac (ACULAR) 0.5 % ophthalmic solution Place 1 drop into both eyes 4 (four) times daily as needed (dry eyes). 09/06/21  Yes [provider]  lactulose (CHRONULAC) 10 GM/15ML solution Take 30 mLs by mouth 2 (two) times daily. for ammonia levels 08/10/22  Yes [provider]  Oxycodone HCl 10 MG TABS Take 5 mg by mouth daily as needed (for severe pain). 07/07/22  Yes [provider]  pantoprazole (PROTONIX) 40 MG tablet Take 40 mg by mouth daily as needed (heartburn). 05/27/21  Yes [provider]  pravastatin (PRAVACHOL) 20 MG tablet Take 1 tablet (20 mg total) by mouth at bedtime. Take after completion of daptomycin 01/03/22  Yes [name], Laxman, MD  sertraline (ZOLOFT) 100 MG tablet Take 2 tablets (200 mg total) by mouth daily. Patient taking differently: Take 150 mg by mouth daily. 06/08/22 08/21/23 Yes [name], MD  spironolactone (ALDACTONE) 50 MG tablet Take 50 mg by mouth daily as needed (swelling). 05/27/21  Yes [provider]  TOUJEO MAX SOLOSTAR 300 UNIT/ML Solostar Pen Inject 60 Units into the skin at bedtime. 06/03/21  Yes [provider]  vitamin C (ASCORBIC ACID) 500 MG tablet Take 500 mg by  mouth 2 (two) times daily.   Yes [provider]  Vitamin D, Ergocalciferol, (DRISDOL) 1.25 MG (50000 UNIT) CAPS capsule Take 1 capsule by mouth every 7 (seven) days. Mondays 04/05/21  Yes [provider]  ACCU-CHEK GUIDE test strip  08/27/21   [provider]  Accu-Chek Softclix Lancets lancets SMARTSIG:2 Topical Twice Daily 08/27/21   [provider]  Blood Glucose Monitoring Suppl (ACCU-CHEK GUIDE) w/Device KIT  08/27/21   [provider]  gabapentin (NEURONTIN) 600 MG tablet Take 600 mg by mouth 3 (three) times daily as needed (nerve pain). 07/23/21    [provider]  GLOBAL EASE INJECT PEN NEEDLES 32G X 4 MM MISC  08/27/21   [provider]     Allergies Allergies  Allergen Reactions   Chlorhexidine Rash   Tape Rash     Family History Family History  Problem Relation Age of Onset   Heart failure Mother    Hyperlipidemia Mother    Diabetes Mellitus II Mother    Heart failure Father    Hyperlipidemia Father     Social History Social History   Socioeconomic History   Marital status: Widowed    Spouse name: Not on file   Number of children: Not on file   Years of education: Not on file   Highest education level: Not on file  Occupational History   Not on file  Tobacco Use   Smoking status: Former    Types: Cigarettes   Smokeless tobacco: Never  Vaping Use   Vaping Use: Never used  Substance and Sexual Activity   Alcohol use: Yes    Comment: wine rare   Drug use: Never   Sexual activity: Not on file  Other Topics Concern   Not on file  Social History Narrative   Not on file   Social Determinants of Health   Financial Resource Strain: Not on file  Food Insecurity: Not on file  Transportation Needs: Not on file  Physical Activity: Not on file  Stress: Not on file  Social Connections: Not on file  Intimate Partner Violence: Not on file     Review of Systems MSK: As noted per HPI above GI: No current Nausea/vomiting ENT: Denies sore throat, epistaxis CV: Denies chest pain  Resp: No current shortness of breath  Other than mentioned above, there are no Constitutional, Neurological, Psychiatric, ENT, Ophthalmological, Cardiovascular, Respiratory, GI, GU, Musculoskeletal, Integumentary, Lymphatic, Endocrine or Allergic issues.     Imaging  Independent interpretation of orthopaedic-relevant films: Left foot x-rays from 08/19/2022 demonstrate acute osteomyelitis involving the second through fifth metatarsals.  Radiographic results: DG Foot Complete Left  Result Date: 08/19/2022 CLINICAL  DATA:  Concern for osteomyelitis. EXAM: LEFT FOOT - COMPLETE 3+ VIEW COMPARISON:  Radiographs dated June 04, 2022 FINDINGS: Postsurgical changes for prior transmetatarsal amputation. There is osseous erosions about the second through fifth residual metatarsals most prominent about the fourth and fifth metatarsal concerning for osteomyelitis. Marked soft tissue swelling about the dorsum of the foot. IMPRESSION: Cortical erosions and periosteal reaction about second through fifth residual metatarsals most prominent about the fourth and fifth metatarsals consistent with acute osteomyelitis. Marked soft tissue swelling about the prior amputation site. Electronically Signed   By: Keane Police D.O.   On: 08/19/2022 22:28   Labs  Recent Labs    08/19/22 2328 08/20/22 0229  WBC  --  2.7*  HGB 9.5* 8.7*  HCT 28.0* 26.4*  PLT  --  91*   Recent  Labs    08/19/22 2250 08/19/22 2328 08/20/22 0227  NA 137 140 139  K 4.7 4.5 3.4*  CL 106 103 109  CO2 26  --  25  BUN 24* 28* 22  CREATININE 0.82 0.70 0.93  GLUCOSE 173* 173* 324*  CALCIUM 8.3*  --  8.1*   Lab Results  Component Value Date   INR 1.6 (H) 05/20/2021   INR 1.6 (H) 05/20/2021   INR 1.7 (H) 05/19/2021        Physical Examination  Patient is a 66 y.o. year old female who is overweight and chronically ill appearing, mood is calm.  Orientation: oriented to person, place, time, and general circumstances  Vital Signs: BP (!) 126/57 (BP Location: Left Arm)   Pulse 87   Temp 98.2 F (36.8 C) (Oral)   Resp 18   Ht 5' 7"  (1.702 m)   Wt 79.8 kg   SpO2 95%   BMI 27.57 kg/m    Gait: Supine on hospital bed  Heart: Normal rate Lungs: Non-labored breathing Abdomen: Soft, Non-tender   Right Upper Extremity: Inspection: Atraumatic Palpation: Nontender ROM: Full, painless Strength: Normal Sensation: Diminished to light touch distally in all distributions Skin: Intact Peripheral Vascular: Palpable radial Joint Stability: No  instability Reflexes: No pathologic Lymph Nodes: None Palpable Coordination: Intact, normal   Left Upper Extremity: Inspection: Atraumatic Palpation: Nontender ROM: Full, painless Strength: Normal Sensation: Diminished to light touch distally in all distributions Skin: Intact Peripheral Vascular: Palpable radial Joint Stability: No instability Reflexes: No pathologic Lymph Nodes: None Palpable Coordination: Intact, normal  Right Lower Extremity: Inspection: Atraumatic Palpation: Nontender ROM: Full, painless Strength: Normal Sensation: Diminished to light touch distally in all distributions Skin: Intact Peripheral Vascular: Palpable DP Joint Stability: No instability Reflexes: No pathologic Lymph Nodes: None Palpable Coordination: Intact, normal   Left Lower Extremity: Inspection: Dehisced TMA wound with beefy red granulation tissue and no acute infectious findings Palpation: Nontender ROM: Able to dorsiflex and plantarflex ankle, normal knee motion Strength: Able to dorsiflex and plantarflex ankle Sensation: Diminished to light touch distally in all distributions Skin: Dehiscence of TMA wound as noted, with surrounding erythema Peripheral Vascular: Weak but palpable DP Joint Stability: No instability Reflexes: No pathologic Lymph Nodes: None Palpable Coordination: Intact, normal, within limits of current condition    Pelvis: Skin: Intact Palpation: Nontender  Stability: No instability      The review of the patient's medications does not in any way constitute an endorsement, by this clinician,  of their use, dosage, indications, route, efficacy, interactions, or other clinical parameters.  This note was generated within the EP[employer] EMR using Dragon medical speech recognition software and may contain inherent errors or omissions not intended by the user. Grammatical and punctuation errors, random word insertions, deletions, pronoun errors and incomplete sentences are  occasional consequences of this technology due to software limitations. Not all errors are caught or corrected.  Although every attempt is made to root out erroneus and incomplete transcription, the note may still not fully represent the intent or opinion of the author. If there are questions or concerns about the content of this note or information contained within the body of this dictation they should be addressed directly with the author for clarification.

## 2022-08-20 NOTE — ED Provider Notes (Signed)
Discussed with Dr. Jonelle Sidle for admission for osteomyelitis of surgical wound.  Patient stable at this time.   Ripley Fraise, MD 08/20/22 [phone]

## 2022-08-20 NOTE — Consult Note (Addendum)
Subjective:  Patient ID: Ariel Arnold, female    DOB: 12-22-1955,  MRN: [id number]  Patient with past medical history of DM2, hepatic encephalopathy, HTN, MRSA, PVD, HLD, pancytopenia and depression.  seen at beside today for worsening left foot TMA stump. Patient has had left TMA and revised twice in the past. Has been following with me in clinic for wound dehiscence but presented today for worsening wound redness and drainage. Denis much pain. Denies n/v/f/c  .   Past Medical History:  Diagnosis Date   Anemia    Anxiety    Aortic atherosclerosis ([facility])    Arthritis    AVM (arteriovenous malformation) of colon    Depression    Essential hypertension    GI bleeding    Recurrent   Hepatic encephalopathy ([place])    History of kidney stones    History of RSV infection    Liver cirrhosis secondary to NASH ([place]) 2004   Biopsy-proven   Obesity    Osteomyelitis ([place])    Peripheral vascular disease ([facility])    Thrombocytopenia ([place])    Type 2 diabetes mellitus ([place])      Past Surgical History:  Procedure Laterality Date   ABDOMINAL HYSTERECTOMY     AMPUTATION Left 04/05/2022   Procedure: Metatarsal amputation revision;  Surgeon: Lorenda Peck, MD;  [facility];  Service: Podiatry;  Laterality: Left;  Surgical team to do block   COLONOSCOPY     Cystoscopy with ureteral stent     ESOPHAGOGASTRODUODENOSCOPY     ESOPHAGOGASTRODUODENOSCOPY (EGD) WITH PROPOFOL N/A 05/20/2021   Procedure: ESOPHAGOGASTRODUODENOSCOPY (EGD) WITH PROPOFOL;  Surgeon: Daneil Dolin, MD;  [facility];  Service: Endoscopy;  Laterality: N/A;  needs intubation   IR ANGIOGRAM FOLLOW UP STUDY  05/21/2021   IR ANGIOGRAM SELECTIVE EACH ADDITIONAL VESSEL  05/21/2021   IR EMBO VENOUS NOT HEMORR HEMANG  INC GUIDE ROADMAPPING  05/21/2021   IR US GUIDE VASC ACCESS RIGHT  05/21/2021   IR VENOGRAM RENAL UNI LEFT  05/21/2021   IRRIGATION AND DEBRIDEMENT FOOT Left 06/04/2022   Procedure: REVISIONAL TRAMSMETATARSAL  AMPUTATION;  Surgeon: Felipa Furnace, DPM;  [facility];  Service: Podiatry;  Laterality: Left;   RADIOLOGY WITH ANESTHESIA N/A 05/21/2021   Procedure: IR WITH ANESTHESIA;  Surgeon: Radiologist, Medication, MD;  Location: [place];  Service: Radiology;  Laterality: N/A;   Toe amputations Bilateral    TUBAL LIGATION     WOUND DEBRIDEMENT Left 11/12/2021   Procedure: DEBRIDEMENT WOUND;FIRST METATARSAL RESECTION; APPLICATION OF SKIN GRAFT SUBSTITUTE;  Surgeon: Evelina Bucy, DPM;  [facility];  Service: Podiatry;  Laterality: Left;       Latest Ref Rng & Units 08/20/2022    2:29 AM 08/19/2022   11:28 PM 06/06/2022    3:15 AM  CBC  WBC 4.0 - 10.5 K/uL 2.7   3.4   Hemoglobin 12.0 - 15.0 g/dL 8.7  9.5  7.8   Hematocrit 36.0 - 46.0 % 26.4  28.0  25.5   Platelets 150 - 400 K/uL 91   84        Latest Ref Rng & Units 08/20/2022    2:27 AM 08/19/2022   11:28 PM 08/19/2022   10:50 PM  BMP  Glucose 70 - 99 mg/dL 324  173  173   BUN 8 - 23 mg/dL 22  28  24    Creatinine 0.44 - 1.00 mg/dL 0.93  0.70  0.82   Sodium 135 - 145 mmol/L 139  140  137   Potassium 3.5 - 5.1 mmol/L 3.4  4.5  4.7   Chloride 98 - 111 mmol/L 109  103  106   CO2 22 - 32 mmol/L 25   26   Calcium 8.9 - 10.3 mg/dL 8.1   8.3      Objective:   Vitals:   08/20/22 0151 08/20/22 0537  BP: (!) 106/41 (!) 125/50  Pulse: 89 88  Resp: 18 18  Temp: 98.1 F (36.7 C) 98.9 F (37.2 C)  SpO2: 95% 96%    General:AA&O x 3. Normal mood and affect   Vascular: DP and PT pulses 2/4 bilateral. Brisk capillary refill to all digits. Pedal hair present   Neruological. Epicritic sensation grossly intact.   Derm: Distal TMA left wound with granular base measuring 6 cm x 3 cm . Erythema and edema surrounding wound. No purulence. No probe to bone. Interspaces clears of maceration. Nails well groomed and normal in appearance     MSK: MMT 5/5 in dorsiflexion, plantar flexion, inversion and eversion. Normal joint ROM without pain or  crepitus.    XR- Left foot  IMPRESSION: Cortical erosions and periosteal reaction about second through fifth residual metatarsals most prominent about the fourth and fifth metatarsals consistent with acute osteomyelitis.   Marked soft tissue swelling about the prior amputation site.     Assessment & Plan:  Patient was evaluated and treated and all questions answered.  DX: Left foot diabetic wound infection and osteomyelitis  Wound care: betadine, DSD  Antibiotics: Per primary  DME: CAM boot   Discussed with patient diagnosis and treatment options.  Imaging reviewed. Cortical erosions of the distal remaining metatarsals concerning for osteomyelitis  Discussed treatment options with patient including return to OR for further resection of bone and closure of wound with washout. Did discuss that patient continues to be at risk for limb loss with this foot as this has now been several surgeries without improvement.. Did discuss BKA and after discussing with ID she is willing to discuss further with orthopedics possible BKA. Will hold off on any surgery now from podiatry standpoint. Patient in agreement with plan and all questions answered.  Podiatry will continue to follow.   Lorenda Peck, DPM  Accessible via secure chat for questions or concerns.

## 2022-08-20 NOTE — Consult Note (Signed)
[facility] for Infectious Disease    Date of Admission:  08/19/2022     Reason for Consult: recurrent/progressive left diabetic foot om    Referring Provider: Cruzita Lederer     Abx: Vanc cefepime        Assessment: 66 yo female dm2, nash cirrhosis, hx recurrent left diabetic foot infection with mrsa bacteremia due to foot infection (05/2022 r/o endocarditis), admitted for same    Patient with 3 previous surgery for similar/same location since 10/2021, most recent 06/04/2022 for revisional tma. She continues to have clinical/imaging progression/relapse of same process  She was previously seen by ID as well and lost to outpatient follow up after inpatient evaluation.  I suspect she might need a bka. Dr Blenda Mounts also had discussed with patient bka is likely needed although it appears patient is reluctant at this time  ABI normal range 03/2022  Podiatry following and planning on I&D tomorrow   Plan: Await I&D and surgical finding.  Continue empiric abx now for concern cellulitis  Due to immunosuppression (cirrhosis/dm) I worry sepsis repsonse inadequate and will get blood cx to r/o occult bacteremia (she did had abx already for 1 day Ortho to see with further input; patient agreeable to seeing ortho. I did discuss given recurrent failure and current progression, a bka would be more appropriate for her Discussed with primary team and podiatry   I spent 75 minute reviewing data/chart, and coordinating care and >50% direct face to face time providing counseling/discussing diagnostics/treatment plan with patient   ------------------------------------------------ Principal Problem:   Infected wound Active Problems:   Liver cirrhosis secondary to NASH ([place])   Anemia in chronic kidney disease   Coronary atherosclerosis   Generalized anxiety disorder   Mixed hyperlipidemia   Type 2 diabetes mellitus treated with insulin (HCC)   Chronic osteomyelitis involving ankle and  foot, left (HCC)   Lactic acidosis    HPI: Ariel Arnold is a 66 y.o. female poorly controlled dm2, hx diabetic foot infection here for same  Reviewed chart  Patient has had recurrent left foot infection requiring most recently revision tma 05/2022. During this admission had mrsa bsi as well, r/o endocarditis (tee unable to be done). Id recommended and opat her for 6 weeks vancomycin. She lost to ID follow up, but finished the course of abx  She follows outpatient podiatry and the previous wound since 05/2022 had never quite healed. The last several days prior to admission it dehisced with purulent discharge  She denies fever/chill/malaise. No recent outpatient abx since 05/2022 vanc course  She is pending I&D for tomorrow She is on empiric abx No sepsis physiology on presentation otherwise No bcx checked Wbc chronically relatively low with chronic thrombocytopenia as well   She reports being tired of going through this over and over again.    Family History  Problem Relation Age of Onset   Heart failure Mother    Hyperlipidemia Mother    Diabetes Mellitus II Mother    Heart failure Father    Hyperlipidemia Father     Social History   Tobacco Use   Smoking status: Former    Types: Cigarettes   Smokeless tobacco: Never  Vaping Use   Vaping Use: Never used  Substance Use Topics   Alcohol use: Yes    Comment: wine rare   Drug use: Never    Allergies  Allergen Reactions   Chlorhexidine Rash   Tape Rash    Review  of Systems: ROS All Other ROS was negative, except mentioned above   Past Medical History:  Diagnosis Date   Anemia    Anxiety    Aortic atherosclerosis (HCC)    Arthritis    AVM (arteriovenous malformation) of colon    Depression    Essential hypertension    GI bleeding    Recurrent   Hepatic encephalopathy (HCC)    History of kidney stones    History of RSV infection    Liver cirrhosis secondary to NASH ([place]) 2004   Biopsy-proven    Obesity    Osteomyelitis (HCC)    Peripheral vascular disease (HCC)    Thrombocytopenia (HCC)    Type 2 diabetes mellitus (HCC)        Scheduled Meds:  enoxaparin (LOVENOX) injection  40 mg Subcutaneous Q24H   insulin aspart  0-20 Units Subcutaneous TID WC   insulin aspart  0-5 Units Subcutaneous QHS   insulin glargine-yfgn  20 Units Subcutaneous QHS   Continuous Infusions:  sodium chloride 100 mL/hr at 08/20/22 0215   ceFEPime (MAXIPIME) IV 2 g (08/20/22 0821)   [START ON 08/21/2022] vancomycin     PRN Meds:.morphine injection, ondansetron **OR** ondansetron (ZOFRAN) IV   OBJECTIVE: Blood pressure (!) 128/57, pulse 87, temperature 98.2 F (36.8 C), temperature source Oral, resp. rate 18, height 5' 7"  (1.702 m), weight 79.8 kg, SpO2 96 %.  Physical Exam   General/constitutional: no distress, pleasant HEENT: Normocephalic, PER, Conj Clear, EOMI, poor dentition Neck supple CV: rrr no mrg Lungs: clear to auscultation, normal respiratory effort Abd: Soft, Nontender Ext: no edema Skin: No Rash Neuro: nonfocal MSK: left foot tma previous incision dehisced; clean based muscle layer; no obvious purulence; erythema tracking back from the edge of the wound by almost 2 inches on dorsum of foot, mildly tender; no fluctuance  Lab Results Lab Results  Component Value Date   WBC 2.7 (L) 08/20/2022   HGB 8.7 (L) 08/20/2022   HCT 26.4 (L) 08/20/2022   MCV 97.8 08/20/2022   PLT 91 (L) 08/20/2022    Lab Results  Component Value Date   CREATININE 0.93 08/20/2022   BUN 22 08/20/2022   NA 139 08/20/2022   K 3.4 (L) 08/20/2022   CL 109 08/20/2022   CO2 25 08/20/2022    Lab Results  Component Value Date   ALT 16 08/20/2022   AST 20 08/20/2022   ALKPHOS 104 08/20/2022   BILITOT 1.2 08/20/2022      Microbiology: No results found for this or any previous visit (from the past 240 hour(s)).   Serology:    Imaging: If present, new imagings (plain films, ct scans, and  mri) have been personally visualized and interpreted; radiology reports have been reviewed. Decision making incorporated into the Impression / Recommendations.  08/19/22 xray left foot Cortical erosions and periosteal reaction about second through fifth residual metatarsals most prominent about the fourth and fifth metatarsals consistent with acute osteomyelitis.   Marked soft tissue swelling about the prior amputation site.     Jabier Mutton, [facility] [place] [phone] pager    08/20/2022, 11:08 AM

## 2022-08-20 NOTE — Progress Notes (Signed)
No charge note  Patient seen and examined this morning, admitted overnight, H&P reviewed and I agree with the assessment and plan.  66 year old female with DM2, HTN, NASH cirrhosis with prior hepatic encephalopathy, HTN, previous MRSA foot wound infection, status post transmetatarsal amputation in April 2023 due to osteomyelitis, came into the hospital for worsening left foot pain, redness, and discharge at home.  X-rays in the ED showed concern for acute osteomyelitis.  Podiatry consulted  Podiatry will take to the OR tomorrow.  For now monitor cultures, and continue broad-spectrum antibiotics.  Consult ID today as well  Scheduled Meds:  enoxaparin (LOVENOX) injection  40 mg Subcutaneous Q24H   insulin aspart  0-20 Units Subcutaneous TID WC   insulin aspart  0-5 Units Subcutaneous QHS   insulin glargine-yfgn  20 Units Subcutaneous QHS   Continuous Infusions:  sodium chloride 100 mL/hr at 08/20/22 0215   ceFEPime (MAXIPIME) IV 2 g (08/20/22 0821)   [START ON 08/21/2022] vancomycin     PRN Meds:.morphine injection, ondansetron **OR** ondansetron (ZOFRAN) IV  Yoshika Vensel M. Cruzita Lederer, MD, PhD [facility]  Between 7 am - 7 pm you can contact me via Amion (for emergencies) or [place] (non urgent matters).  I am not available 7 pm - 7 am, please contact night coverage MD/APP via Amion

## 2022-08-20 NOTE — Progress Notes (Addendum)
Pt CBG-315. On call Dr Hal Hope made aware via secure chat with no new order received.

## 2022-08-20 NOTE — Progress Notes (Signed)
Pharmacy Antibiotic Note  Ariel Arnold is a 66 y.o. female admitted on 08/19/2022 with osteomyelitis of left foot.  Pharmacy has been consulted for Vancomycin dosing.  In the ED patient has received Cefepime 2gm IV x 1 dose.  Plan: Vancomycin 1500 mg IV Q 24 hrs. Goal AUC 400-550.  Expected AUC: 444.6  SCr used: 0.8 (rounded up from 0.7) Cefepime per MD Follow renal function Monitor vancomycin levels as appropriate    Height: 5' 7"  (170.2 cm) Weight: 79.8 kg (176 lb) IBW/kg (Calculated) : 61.6  Temp (24hrs), Avg:98 F (36.7 C), Min:98 F (36.7 C), Max:98 F (36.7 C)  Recent Labs  Lab 08/19/22 2250 08/19/22 2328  CREATININE 0.82 0.70  LATICACIDVEN 2.2*  --     Estimated Creatinine Clearance: 73.2 mL/min (by C-G formula based on SCr of 0.7 mg/dL).    Allergies  Allergen Reactions   Chlorhexidine Rash   Tape Rash    Antimicrobials this admission: 9/2 Cefepime >>   9/2 Vancomycin >>    Dose adjustments this admission:    Microbiology results:    Thank you for allowing pharmacy to be a part of this patient's care.  Everette Rank, PharmD 08/20/2022 12:28 AM

## 2022-08-20 NOTE — ED Notes (Signed)
Attempt made to call floor 3 for pt report

## 2022-08-21 ENCOUNTER — Encounter (HOSPITAL_COMMUNITY)
Admission: EM | Disposition: A | Discharge status: Skilled Nursing Facility | Payer: Self-pay | Source: Home / Self Care | Attending: Internal Medicine

## 2022-08-21 DIAGNOSIS — T148XXA Other injury of unspecified body region, initial encounter: Secondary | ICD-10-CM | POA: Diagnosis not present

## 2022-08-21 DIAGNOSIS — L089 Local infection of the skin and subcutaneous tissue, unspecified: Secondary | ICD-10-CM | POA: Diagnosis not present

## 2022-08-21 LAB — URINALYSIS, ROUTINE W REFLEX MICROSCOPIC
Bilirubin Urine: NEGATIVE
Glucose, UA: NEGATIVE mg/dL
Hgb urine dipstick: NEGATIVE
Ketones, ur: NEGATIVE mg/dL
Nitrite: NEGATIVE
Protein, ur: NEGATIVE mg/dL
Specific Gravity, Urine: 1.024 (ref 1.005–1.030)
pH: 5 (ref 5.0–8.0)

## 2022-08-21 LAB — GLUCOSE, CAPILLARY
Glucose-Capillary: 141 mg/dL — ABNORMAL HIGH (ref 70–99)
Glucose-Capillary: 320 mg/dL — ABNORMAL HIGH (ref 70–99)
Glucose-Capillary: 93 mg/dL (ref 70–99)
Glucose-Capillary: 186 mg/dL — ABNORMAL HIGH (ref 70–99)

## 2022-08-21 SURGERY — AMPUTATION, FOOT, PARTIAL
Anesthesia: Choice | Site: Foot | Laterality: Left

## 2022-08-21 MED ORDER — FERROUS SULFATE 325 (65 FE) MG PO TABS
325.0000 mg | ORAL_TABLET | Freq: Two times a day (BID) | ORAL | Status: DC
  Administered 2022-08-22 – 2022-08-31 (×18): 325 mg via ORAL
  Filled 2022-08-21 (×19): qty 1

## 2022-08-21 MED ORDER — GABAPENTIN 300 MG PO CAPS
600.0000 mg | ORAL_CAPSULE | Freq: Three times a day (TID) | ORAL | Status: DC | PRN
  Administered 2022-08-21 – 2022-08-28 (×6): 600 mg via ORAL
  Filled 2022-08-21 (×6): qty 2

## 2022-08-21 MED ORDER — SERTRALINE HCL 50 MG PO TABS
150.0000 mg | ORAL_TABLET | Freq: Every day | ORAL | Status: DC
  Administered 2022-08-21 – 2022-08-31 (×10): 150 mg via ORAL
  Filled 2022-08-21 (×11): qty 1

## 2022-08-21 MED ORDER — PRAVASTATIN SODIUM 10 MG PO TABS
20.0000 mg | ORAL_TABLET | Freq: Every day | ORAL | Status: DC
  Administered 2022-08-21 – 2022-08-30 (×10): 20 mg via ORAL
  Filled 2022-08-21: qty 2
  Filled 2022-08-21 (×2): qty 1
  Filled 2022-08-21 (×5): qty 2
  Filled 2022-08-21 (×2): qty 1

## 2022-08-21 MED ORDER — FOLIC ACID 1 MG PO TABS
1.0000 mg | ORAL_TABLET | Freq: Two times a day (BID) | ORAL | Status: DC
  Administered 2022-08-21 – 2022-08-31 (×19): 1 mg via ORAL
  Filled 2022-08-21 (×19): qty 1

## 2022-08-21 MED ORDER — CARVEDILOL 6.25 MG PO TABS
6.2500 mg | ORAL_TABLET | Freq: Two times a day (BID) | ORAL | Status: DC
  Administered 2022-08-21 – 2022-08-31 (×20): 6.25 mg via ORAL
  Filled 2022-08-21 (×20): qty 1

## 2022-08-21 MED ORDER — ALBUTEROL SULFATE (2.5 MG/3ML) 0.083% IN NEBU: 2.5000 mg | INHALATION_SOLUTION | RESPIRATORY_TRACT | Status: DC | PRN

## 2022-08-21 NOTE — Progress Notes (Signed)
Subjective:  Patient ID: Ariel Arnold, female    DOB: [birth year],  MRN: [id number]   Patient seen at bedside this morning to discuss further plan of care. Denis much pain. Daughters present today to discuss options for BKA.  Denies n/v/f/c  .        Past Medical History:  Diagnosis Date   Anemia     Anxiety     Aortic atherosclerosis (HCC)     Arthritis     AVM (arteriovenous malformation) of colon     Depression     Essential hypertension     GI bleeding      Recurrent   Hepatic encephalopathy ([place])     History of kidney stones     History of RSV infection     Liver cirrhosis secondary to NASH ([place]) 2004    Biopsy-proven   Obesity     Osteomyelitis ([place])     Peripheral vascular disease (HCC)     Thrombocytopenia ([place])     Type 2 diabetes mellitus ([place])             Past Surgical History:  Procedure Laterality Date   ABDOMINAL HYSTERECTOMY       AMPUTATION Left 04/05/2022    Procedure: Metatarsal amputation revision;  Surgeon: Lorenda Peck, MD;  Location: [facility];  Service: Podiatry;  Laterality: Left;  Surgical team to do block   COLONOSCOPY       Cystoscopy with ureteral stent       ESOPHAGOGASTRODUODENOSCOPY       ESOPHAGOGASTRODUODENOSCOPY (EGD) WITH PROPOFOL N/A 05/20/2021    Procedure: ESOPHAGOGASTRODUODENOSCOPY (EGD) WITH PROPOFOL;  Surgeon: Daneil Dolin, MD;  Location: [facility];  Service: Endoscopy;  Laterality: N/A;  needs intubation   IR ANGIOGRAM FOLLOW UP STUDY   05/21/2021   IR ANGIOGRAM SELECTIVE EACH ADDITIONAL VESSEL   05/21/2021   IR EMBO VENOUS NOT HEMORR HEMANG  INC GUIDE ROADMAPPING   05/21/2021   IR US GUIDE VASC ACCESS RIGHT   05/21/2021   IR VENOGRAM RENAL UNI LEFT   05/21/2021   IRRIGATION AND DEBRIDEMENT FOOT Left 06/04/2022    Procedure: REVISIONAL TRAMSMETATARSAL AMPUTATION;  Surgeon: Felipa Furnace, DPM;  Location: [facility];  Service: Podiatry;  Laterality: Left;   RADIOLOGY WITH ANESTHESIA N/A 05/21/2021    Procedure: IR WITH ANESTHESIA;   Surgeon: Radiologist, Medication, MD;  Location: [facility];  Service: Radiology;  Laterality: N/A;   Toe amputations Bilateral     TUBAL LIGATION       WOUND DEBRIDEMENT Left 11/12/2021    Procedure: DEBRIDEMENT WOUND;FIRST METATARSAL RESECTION; APPLICATION OF SKIN GRAFT SUBSTITUTE;  Surgeon: Evelina Bucy, DPM;  Location: [facility];  Service: Podiatry;  Laterality: Left;          Latest Ref Rng & Units 08/20/2022    2:29 AM 08/19/2022   11:28 PM 06/06/2022    3:15 AM  CBC  WBC 4.0 - 10.5 K/uL 2.7    3.4   Hemoglobin 12.0 - 15.0 g/dL 8.7  9.5  7.8   Hematocrit 36.0 - 46.0 % 26.4  28.0  25.5   Platelets 150 - 400 K/uL 91    84           Latest Ref Rng & Units 08/20/2022    2:27 AM 08/19/2022   11:28 PM 08/19/2022   10:50 PM  BMP  Glucose 70 - 99 mg/dL 324  173  173   BUN 8 - 23 mg/dL 22  28  24   Creatinine 0.44 - 1.00 mg/dL 0.93  0.70  0.82   Sodium 135 - 145 mmol/L 139  140  137   Potassium 3.5 - 5.1 mmol/L 3.4  4.5  4.7   Chloride 98 - 111 mmol/L 109  103  106   CO2 22 - 32 mmol/L 25    26   Calcium 8.9 - 10.3 mg/dL 8.1    8.3         Objective:        Vitals:    08/20/22 0151 08/20/22 0537  BP: (!) 106/41 (!) 125/50  Pulse: 89 88  Resp: 18 18  Temp: 98.1 F (36.7 C) 98.9 F (37.2 C)  SpO2: 95% 96%      General:AA&O x 3. Normal mood and affect    Vascular: DP and PT pulses 2/4 bilateral. Brisk capillary refill to all digits. Pedal hair present    Neruological. Epicritic sensation grossly intact.    Derm: Distal TMA left wound with granular base measuring 6 cm x 3 cm . Erythema and edema surrounding wound. No purulence. No probe to bone. Interspaces clears of maceration. Nails well groomed and normal in appearance      MSK: MMT 5/5 in dorsiflexion, plantar flexion, inversion and eversion. Normal joint ROM without pain or crepitus.      XR- Left foot  IMPRESSION: Cortical erosions and periosteal reaction about second through fifth residual metatarsals most prominent  about the fourth and fifth metatarsals consistent with acute osteomyelitis.   Marked soft tissue swelling about the prior amputation site.       Assessment & Plan:  Patient was evaluated and treated and all questions answered.   DX: Left foot diabetic wound infection and osteomyelitis  Wound care: betadine, DSD  Antibiotics: Per primary  DME: CAM boot   Discussed with patient diagnosis and treatment options.  Imaging reviewed. Cortical erosions of the distal remaining metatarsals concerning for osteomyelitis  Discussed treatment options with patient and daughters again in detail. Patient has been amenable to BKA although hesitant due mostly to vacation coming up. Discussed with daughters and her that taking care of her infection sooner as opposed to later will be safer and better for her health. Expressed understanding. Many questions about logistics of BKA. Discussed Dr. Sharol Given will be by tomorrow to discuss with them. Patient  ended conversation with more agreement in plan for BKA.  Patient in agreement with plan and all questions answered.  Podiatry will continue to follow peripherally.     Lorenda Peck, DPM  Accessible via secure chat for questions or concerns.

## 2022-08-21 NOTE — Plan of Care (Signed)

## 2022-08-21 NOTE — Progress Notes (Signed)
PROGRESS NOTE  Ariel Arnold ALP:[id number] DOB: [birth year] DOA: 08/19/2022 PCP: Jacqualine Code, DO   LOS: 1 day   Brief Narrative / Interim history: 66 year old female with DM2, HTN, NASH cirrhosis with prior hepatic encephalopathy, HTN, previous MRSA foot wound infection, status post transmetatarsal amputation in April 2023 due to osteomyelitis, came into the hospital for worsening left foot pain, redness, and discharge at home.  X-rays in the ED showed concern for acute osteomyelitis.  Pertinent data: Foot x-ray 9/1-cortical erosions and periosteal reaction second through fifth residual metatarsals consistent with acute osteomyelitis, soft tissue swelling about the prior amputation site  Subjective / 24h Interval events: She is having second thoughts about BKA.  Assesement and Plan: Principal Problem:   Infected wound Active Problems:   Liver cirrhosis secondary to NASH (HCC)   Anemia in chronic kidney disease   Coronary atherosclerosis   Generalized anxiety disorder   Mixed hyperlipidemia   Type 2 diabetes mellitus treated with insulin (HCC)   Chronic osteomyelitis involving ankle and foot, left (HCC)   Lactic acidosis   Diabetic osteomyelitis (HCC)  Principal problem Left wound infection, osteomyelitis-she was started on antibiotics with Vanco cefepime, continue.  It appears that she has failed multiple attempts to save the foot with persistent and recurrent infection.  A BKA is recommended at this point, orthopedic surgery, Dr. Sharol Given, will see her tomorrow.  While she initially she appeared to agree, now she is having second thoughts -ID and podiatry also following, appreciate input  Active problems [name] cirrhosis-stable, appears at baseline, without evidence of hepatic encephalopathy  HTN-currently she is normotensive, continue to monitor.  Continue home Coreg  Chronic diastolic CHF-euvolemic currently, stop fluids today.  2D echo June 2023 shows normal LVEF 60-65%,  grade 2 diastolic dysfunction.  RV was normal.  Hypokalemia-replete and continue to monitor  Pancytopenia-due to her underlying liver disease.  Continue to monitor WBC, hemoglobin, platelets.  Resume home folic acid and iron supplementation  Hyperlipidemia-resume statin today  Type 2 diabetes mellitus-continue glargine, sliding scale  CBG (last 3)  Recent Labs    08/20/22 1641 08/20/22 2117 08/21/22 0809  GLUCAP 126* 203* 141*     Scheduled Meds:  Chlorhexidine Gluconate Cloth  6 each Topical Q0600   enoxaparin (LOVENOX) injection  40 mg Subcutaneous Q24H   insulin aspart  0-20 Units Subcutaneous TID WC   insulin aspart  0-5 Units Subcutaneous QHS   insulin glargine-yfgn  20 Units Subcutaneous QHS   mupirocin ointment  1 Application Nasal BID   Continuous Infusions:  sodium chloride 100 mL/hr at 08/21/22 0408   ceFEPime (MAXIPIME) IV 2 g (08/21/22 0900)   vancomycin Stopped (08/21/22 0259)   PRN Meds:.morphine injection, ondansetron **OR** ondansetron (ZOFRAN) IV  Current Outpatient Medications  Medication Instructions   ACCU-CHEK GUIDE test strip No dose, route, or frequency recorded.   Accu-Chek Softclix Lancets lancets SMARTSIG:2 Topical Twice Daily   albuterol (VENTOLIN HFA) 108 (90 Base) MCG/ACT inhaler 2 puffs, Inhalation, Every 4 hours PRN   ascorbic acid (VITAMIN C) 500 mg, Oral, 2 times daily   Blood Glucose Monitoring Suppl (ACCU-CHEK GUIDE) w/Device KIT No dose, route, or frequency recorded.   Calcium Carbonate-Vit D-Min (CALCIUM 1200) 1200-1000 MG-UNIT CHEW 2 tablets, Oral, Daily   carvedilol (COREG) 6.25 mg, Oral, 2 times daily   clotrimazole-betamethasone (LOTRISONE) cream 1 Application, Topical, Daily PRN   diclofenac Sodium (VOLTAREN) 2 g, Topical, 2 times daily   ferrous sulfate 325 mg, Oral, 2 times daily  folic acid (FOLVITE) 1 mg, Oral, 2 times daily   furosemide (LASIX) 20 mg, Oral, Weekly   gabapentin (NEURONTIN) 600 mg, Oral, 3 times daily PRN    GLOBAL EASE INJECT PEN NEEDLES 32G X 4 MM MISC No dose, route, or frequency recorded.   HumaLOG KwikPen 8 Units, Subcutaneous, 4 times daily, Per sliding scale   hydrOXYzine (ATARAX) 25 mg, Oral, Daily   ketorolac (ACULAR) 0.5 % ophthalmic solution 1 drop, Both Eyes, 4 times daily PRN   lactulose (CHRONULAC) 10 GM/15ML solution 30 mLs, Oral, 2 times daily, for ammonia levels   Oxycodone HCl 5 mg, Oral, Daily PRN   pantoprazole (PROTONIX) 40 mg, Oral, Daily PRN   pravastatin (PRAVACHOL) 20 mg, Oral, Daily at bedtime, Take after completion of daptomycin   sertraline (ZOLOFT) 200 mg, Oral, Daily   spironolactone (ALDACTONE) 50 mg, Oral, Daily PRN   Toujeo Max SoloStar 60 Units, Subcutaneous, Daily at bedtime   Vitamin D, Ergocalciferol, (DRISDOL) 1.25 MG (50000 UNIT) CAPS capsule 1 capsule, Oral, Every 7 days, Mondays    Diet Orders (From admission, onward)     Start     Ordered   08/20/22 1610  Diet Carb Modified Fluid consistency: Thin; Room service appropriate? Yes  Diet effective now       Question Answer Comment  Diet-HS Snack? Nothing   Calorie Level Medium 1600-2000   Fluid consistency: Thin   Room service appropriate? Yes      08/20/22 1609            DVT prophylaxis: SCD's Start: 08/20/22 1709 enoxaparin (LOVENOX) injection 40 mg Start: 08/20/22 1000   Lab Results  Component Value Date   PLT 91 (L) 08/20/2022      Code Status: Full Code  Family Communication: No family at bedside  Status is: Inpatient Remains inpatient appropriate because: Severity of illness   Level of care: Med-Surg  Consultants:  Podiatry Infectious disease   Objective: Vitals:   08/20/22 1640 08/20/22 2119 08/21/22 0450 08/21/22 0550  BP: (!) 120/54 (!) 121/53 (!) 123/57   Pulse: 73 71 74   Resp: 18 18    Temp: 98.5 F (36.9 C) 98.2 F (36.8 C) 97.6 F (36.4 C)   TempSrc: Oral Oral Oral   SpO2: 96% 99% 100%   Weight:    86.7 kg  Height:        Intake/Output Summary  (Last 24 hours) at 08/21/2022 0916 Last data filed at 08/21/2022 0450 Gross per 24 hour  Intake 3574.66 ml  Output 900 ml  Net 2674.66 ml   Wt Readings from Last 3 Encounters:  08/21/22 86.7 kg  06/02/22 85.6 kg  04/05/22 83.9 kg    Examination:  Constitutional: NAD Eyes: no scleral icterus ENMT: Mucous membranes are moist.  Neck: normal, supple Respiratory: clear to auscultation bilaterally, no wheezing, no crackles. Normal respiratory effort. No accessory muscle use.  Cardiovascular: Regular rate and rhythm, no murmurs / rubs / gallops. No LE edema.  Abdomen: non distended, no tenderness. Bowel sounds positive.  Musculoskeletal: no clubbing / cyanosis.  Skin: no rashes Neurologic: non focal   Data Reviewed: I have independently reviewed following labs and imaging studies   CBC Recent Labs  Lab 08/19/22 2328 08/20/22 0229  WBC  --  2.7*  HGB 9.5* 8.7*  HCT 28.0* 26.4*  PLT  --  91*  MCV  --  97.8  MCH  --  32.2  MCHC  --  33.0  RDW  --  14.4  LYMPHSABS  --  0.7  MONOABS  --  0.2  EOSABS  --  0.2  BASOSABS  --  0.0    Recent Labs  Lab 08/19/22 2250 08/19/22 2328 08/20/22 0227  NA 137 140 139  K 4.7 4.5 3.4*  CL 106 103 109  CO2 26  --  25  GLUCOSE 173* 173* 324*  BUN 24* 28* 22  CREATININE 0.82 0.70 0.93  CALCIUM 8.3*  --  8.1*  AST 37  --  20  ALT 19  --  16  ALKPHOS 132*  --  104  BILITOT 1.6*  --  1.2  ALBUMIN 2.8*  --  2.4*  CRP 6.0*  --   --   LATICACIDVEN 2.2*  --  1.8    ------------------------------------------------------------------------------------------------------------------ No results for input(s): "CHOL", "HDL", "LDLCALC", "TRIG", "CHOLHDL", "LDLDIRECT" in the last 72 hours.  Lab Results  Component Value Date   HGBA1C 11.2 (H) 04/03/2022   ------------------------------------------------------------------------------------------------------------------ No results for input(s): "TSH", "T4TOTAL", "T3FREE", "THYROIDAB" in the  last 72 hours.  Invalid input(s): "FREET3"  Cardiac Enzymes No results for input(s): "CKMB", "TROPONINI", "MYOGLOBIN" in the last 168 hours.  Invalid input(s): "CK" ------------------------------------------------------------------------------------------------------------------ No results found for: "BNP"  CBG: Recent Labs  Lab 08/20/22 0729 08/20/22 1133 08/20/22 1641 08/20/22 2117 08/21/22 0809  GLUCAP 258* 154* 126* 203* 141*    Recent Results (from the past 240 hour(s))  Surgical pcr screen     Status: Abnormal   Collection Time: 08/20/22  8:00 PM   Specimen: Nasal Mucosa; Nasal Swab  Result Value Ref Range Status   MRSA, PCR POSITIVE (A) NEGATIVE Final    Comment: CRITICAL RESULT CALLED TO, READ BACK BY AND VERIFIED WITH: Redmond School, RN 2145 MH    Staphylococcus aureus POSITIVE (A) NEGATIVE Final    Comment: (NOTE) The Xpert SA Assay (FDA approved for NASAL specimens in patients 70 years of age and older), is one component of a comprehensive surveillance program. It is not intended to diagnose infection nor to guide or monitor treatment. Performed at [facility], [place] [address] [place], [place] [address]      Radiology Studies: No results found.   Marzetta Board, MD, PhD [facility]  Between 7 am - 7 pm I am available, please contact me via Amion (for emergencies) or Securechat (non urgent messages)  Between 7 pm - 7 am I am not available, please contact night coverage MD/APP via Amion

## 2022-08-22 DIAGNOSIS — L089 Local infection of the skin and subcutaneous tissue, unspecified: Secondary | ICD-10-CM | POA: Diagnosis not present

## 2022-08-22 DIAGNOSIS — M86172 Other acute osteomyelitis, left ankle and foot: Secondary | ICD-10-CM

## 2022-08-22 DIAGNOSIS — T148XXA Other injury of unspecified body region, initial encounter: Secondary | ICD-10-CM | POA: Diagnosis not present

## 2022-08-22 LAB — COMPREHENSIVE METABOLIC PANEL
ALT: 14 U/L (ref 0–44)
AST: 23 U/L (ref 15–41)
Albumin: 2 g/dL — ABNORMAL LOW (ref 3.5–5.0)
Alkaline Phosphatase: 96 U/L (ref 38–126)
Anion gap: 4 — ABNORMAL LOW (ref 5–15)
BUN: 21 mg/dL (ref 8–23)
CO2: 21 mmol/L — ABNORMAL LOW (ref 22–32)
Calcium: 7.8 mg/dL — ABNORMAL LOW (ref 8.9–10.3)
Chloride: 111 mmol/L (ref 98–111)
Creatinine, Ser: 0.8 mg/dL (ref 0.44–1.00)
GFR, Estimated: >60 mL/min (ref >60)
Glucose, Bld: 143 mg/dL — ABNORMAL HIGH (ref 70–99)
Potassium: 3.9 mmol/L (ref 3.5–5.1)
Sodium: 136 mmol/L (ref 135–145)
Total Bilirubin: 0.9 mg/dL (ref 0.3–1.2)
Total Protein: 5.8 g/dL — ABNORMAL LOW (ref 6.5–8.1)

## 2022-08-22 LAB — CBC
HCT: 25.2 % — ABNORMAL LOW (ref 36.0–46.0)
Hemoglobin: 8.2 g/dL — ABNORMAL LOW (ref 12.0–15.0)
MCH: 31.8 pg (ref 26.0–34.0)
MCHC: 32.5 g/dL (ref 30.0–36.0)
MCV: 97.7 fL (ref 80.0–100.0)
Platelets: 88 10*3/uL — ABNORMAL LOW (ref 150–400)
RBC: 2.58 MIL/uL — ABNORMAL LOW (ref 3.87–5.11)
RDW: 14.5 % (ref 11.5–15.5)
WBC: 3 10*3/uL — ABNORMAL LOW (ref 4.0–10.5)
nRBC: 0 % (ref 0.0–0.2)

## 2022-08-22 LAB — GLUCOSE, CAPILLARY
Glucose-Capillary: 103 mg/dL — ABNORMAL HIGH (ref 70–99)
Glucose-Capillary: 161 mg/dL — ABNORMAL HIGH (ref 70–99)
Glucose-Capillary: 160 mg/dL — ABNORMAL HIGH (ref 70–99)
Glucose-Capillary: 170 mg/dL — ABNORMAL HIGH (ref 70–99)

## 2022-08-22 LAB — MAGNESIUM: Magnesium: 1.7 mg/dL (ref 1.7–2.4)

## 2022-08-22 LAB — PHOSPHORUS: Phosphorus: 2.6 mg/dL (ref 2.5–4.6)

## 2022-08-22 NOTE — Progress Notes (Signed)
PROGRESS NOTE  Ariel Arnold:[id number] DOB: 1956-08-03 DOA: 08/19/2022 PCP: Jacqualine Code, DO   LOS: 2 days   Brief Narrative / Interim history: 66 year old female with DM2, HTN, NASH cirrhosis with prior hepatic encephalopathy, HTN, previous MRSA foot wound infection, status post transmetatarsal amputation in April 2023 due to osteomyelitis, came into the hospital for worsening left foot pain, redness, and discharge at home.  X-rays in the ED showed concern for acute osteomyelitis.  Pertinent data: Foot x-ray 9/1-cortical erosions and periosteal reaction second through fifth residual metatarsals consistent with acute osteomyelitis, soft tissue swelling about the prior amputation site  Subjective / 24h Interval events: No significant complaints this morning.  She is now agreeable to BKA, awaiting to talk to Dr. Sharol Given  Assesement and Plan: Principal Problem:   Infected wound Active Problems:   Liver cirrhosis secondary to NASH ([place])   Anemia in chronic kidney disease   Coronary atherosclerosis   Generalized anxiety disorder   Mixed hyperlipidemia   Type 2 diabetes mellitus treated with insulin (HCC)   Chronic osteomyelitis involving ankle and foot, left ([facility])   Lactic acidosis   Diabetic osteomyelitis ([place])  Principal problem Left wound infection, osteomyelitis-she was started on antibiotics with Vanco cefepime, continue.  It appears that she has failed multiple attempts to save the foot with persistent and recurrent infection.  A BKA is recommended at this point, orthopedic surgery, Dr. Sharol Given, will see her to determine timing for BKA.  She is now agreeable -ID and podiatry also following, appreciate input  Active problems Ariel Arnold cirrhosis-stable, appears at baseline, without evidence of hepatic encephalopathy  HTN-continue home Coreg.  She is normotensive  Chronic diastolic CHF-euvolemic currently.  2D echo June 2023 shows normal LVEF 55-73%, grade 2 diastolic  dysfunction.  RV was normal.  Hypokalemia-potassium normalized this morning at 3.9  Pancytopenia-due to her underlying liver disease.  Continue to monitor WBC, hemoglobin, platelets.  Continue folic acid, iron  Hyperlipidemia-resume statin today  Type 2 diabetes mellitus-continue glargine, sliding scale  CBG (last 3)  Recent Labs    08/21/22 1720 08/21/22 2227 08/22/22 0746  GLUCAP 93 186* 103*      Scheduled Meds:  carvedilol  6.25 mg Oral BID   Chlorhexidine Gluconate Cloth  6 each Topical Q0600   enoxaparin (LOVENOX) injection  40 mg Subcutaneous Q24H   ferrous sulfate  325 mg Oral BID WC   folic acid  1 mg Oral BID   insulin aspart  0-20 Units Subcutaneous TID WC   insulin aspart  0-5 Units Subcutaneous QHS   insulin glargine-yfgn  20 Units Subcutaneous QHS   mupirocin ointment  1 Application Nasal BID   pravastatin  20 mg Oral QHS   sertraline  150 mg Oral Daily   Continuous Infusions:  ceFEPime (MAXIPIME) IV 2 g (08/22/22 0919)   vancomycin 1,500 mg (08/22/22 0146)   PRN Meds:.albuterol, gabapentin, morphine injection, ondansetron **OR** ondansetron (ZOFRAN) IV  Current Outpatient Medications  Medication Instructions   ACCU-CHEK GUIDE test strip No dose, route, or frequency recorded.   Accu-Chek Softclix Lancets lancets SMARTSIG:2 Topical Twice Daily   albuterol (VENTOLIN HFA) 108 (90 Base) MCG/ACT inhaler 2 puffs, Inhalation, Every 4 hours PRN   ascorbic acid (VITAMIN C) 500 mg, Oral, 2 times daily   Blood Glucose Monitoring Suppl (ACCU-CHEK GUIDE) w/Device KIT No dose, route, or frequency recorded.   Calcium Carbonate-Vit D-Min (CALCIUM 1200) 1200-1000 MG-UNIT CHEW 2 tablets, Oral, Daily   carvedilol (COREG) 6.25 mg, Oral, 2  times daily   clotrimazole-betamethasone (LOTRISONE) cream 1 Application, Topical, Daily PRN   diclofenac Sodium (VOLTAREN) 2 g, Topical, 2 times daily   ferrous sulfate 325 mg, Oral, 2 times daily   folic acid (FOLVITE) 1 mg, Oral, 2  times daily   furosemide (LASIX) 20 mg, Oral, Weekly   gabapentin (NEURONTIN) 600 mg, Oral, 3 times daily PRN   GLOBAL EASE INJECT PEN NEEDLES 32G X 4 MM MISC No dose, route, or frequency recorded.   HumaLOG KwikPen 8 Units, Subcutaneous, 4 times daily, Per sliding scale   hydrOXYzine (ATARAX) 25 mg, Oral, Daily   ketorolac (ACULAR) 0.5 % ophthalmic solution 1 drop, Both Eyes, 4 times daily PRN   lactulose (CHRONULAC) 10 GM/15ML solution 30 mLs, Oral, 2 times daily, for ammonia levels   Oxycodone HCl 5 mg, Oral, Daily PRN   pantoprazole (PROTONIX) 40 mg, Oral, Daily PRN   pravastatin (PRAVACHOL) 20 mg, Oral, Daily at bedtime, Take after completion of daptomycin   sertraline (ZOLOFT) 200 mg, Oral, Daily   spironolactone (ALDACTONE) 50 mg, Oral, Daily PRN   Toujeo Max SoloStar 60 Units, Subcutaneous, Daily at bedtime   Vitamin D, Ergocalciferol, (DRISDOL) 1.25 MG (50000 UNIT) CAPS capsule 1 capsule, Oral, Every 7 days, Mondays    Diet Orders (From admission, onward)     Start     Ordered   08/20/22 1610  Diet Carb Modified Fluid consistency: Thin; Room service appropriate? Yes  Diet effective now       Question Answer Comment  Diet-HS Snack? Nothing   Calorie Level Medium 1600-2000   Fluid consistency: Thin   Room service appropriate? Yes      08/20/22 1609            DVT prophylaxis: SCD's Start: 08/20/22 1709 enoxaparin (LOVENOX) injection 40 mg Start: 08/20/22 1000   Lab Results  Component Value Date   PLT 88 (L) 08/22/2022      Code Status: Full Code  Family Communication: No family at bedside  Status is: Inpatient Remains inpatient appropriate because: Severity of illness   Level of care: Med-Surg  Consultants:  Podiatry Infectious disease   Objective: Vitals:   08/21/22 0450 08/21/22 0550 08/21/22 1327 08/21/22 2224  BP: (!) 123/57  132/64 (!) 125/59  Pulse: 74  75 91  Resp:   16 18  Temp: 97.6 F (36.4 C)  98 F (36.7 C) 98.3 F (36.8 C)   TempSrc: Oral  Oral Oral  SpO2: 100%  98% 96%  Weight:  86.7 kg    Height:        Intake/Output Summary (Last 24 hours) at 08/22/2022 1006 Last data filed at 08/22/2022 0606 Gross per 24 hour  Intake 1693.34 ml  Output 670 ml  Net 1023.34 ml    Wt Readings from Last 3 Encounters:  08/21/22 86.7 kg  06/02/22 85.6 kg  04/05/22 83.9 kg    Examination: Constitutional: NAD Eyes: lids and conjunctivae normal, no scleral icterus ENMT: mmm Neck: normal, supple Respiratory: clear to auscultation bilaterally, no wheezing, no crackles. Normal respiratory effort.  Cardiovascular: Regular rate and rhythm, no murmurs / rubs / gallops. No LE edema. Abdomen: soft, no distention, no tenderness. Bowel sounds positive.  Skin: no rashes Neurologic: no focal deficits, equal strength   Data Reviewed: I have independently reviewed following labs and imaging studies   CBC Recent Labs  Lab 08/19/22 2328 08/20/22 0229 08/22/22 0402  WBC  --  2.7* 3.0*  HGB 9.5* 8.7* 8.2*  HCT 28.0* 26.4* 25.2*  PLT  --  91* 88*  MCV  --  97.8 97.7  MCH  --  32.2 31.8  MCHC  --  33.0 32.5  RDW  --  14.4 14.5  LYMPHSABS  --  0.7  --   MONOABS  --  0.2  --   EOSABS  --  0.2  --   BASOSABS  --  0.0  --      Recent Labs  Lab 08/19/22 2250 08/19/22 2328 08/20/22 0227 08/22/22 0402  NA 137 140 139 136  K 4.7 4.5 3.4* 3.9  CL 106 103 109 111  CO2 26  --  25 21*  GLUCOSE 173* 173* 324* 143*  BUN 24* 28* 22 21  CREATININE 0.82 0.70 0.93 0.80  CALCIUM 8.3*  --  8.1* 7.8*  AST 37  --  20 23  ALT 19  --  16 14  ALKPHOS 132*  --  104 96  BILITOT 1.6*  --  1.2 0.9  ALBUMIN 2.8*  --  2.4* 2.0*  MG  --   --   --  1.7  CRP 6.0*  --   --   --   LATICACIDVEN 2.2*  --  1.8  --      ------------------------------------------------------------------------------------------------------------------ No results for input(s): "CHOL", "HDL", "LDLCALC", "TRIG", "CHOLHDL", "LDLDIRECT" in the last 72  hours.  Lab Results  Component Value Date   HGBA1C 11.2 (H) 04/03/2022   ------------------------------------------------------------------------------------------------------------------ No results for input(s): "TSH", "T4TOTAL", "T3FREE", "THYROIDAB" in the last 72 hours.  Invalid input(s): "FREET3"  Cardiac Enzymes No results for input(s): "CKMB", "TROPONINI", "MYOGLOBIN" in the last 168 hours.  Invalid input(s): "CK" ------------------------------------------------------------------------------------------------------------------ No results found for: "BNP"  CBG: Recent Labs  Lab 08/21/22 0809 08/21/22 1138 08/21/22 1720 08/21/22 2227 08/22/22 0746  GLUCAP 141* 320* 93 186* 103*     Recent Results (from the past 240 hour(s))  Culture, blood (Routine X 2) w Reflex to ID Panel     Status: None (Preliminary result)   Collection Time: 08/20/22 11:47 AM   Specimen: BLOOD  Result Value Ref Range Status   Specimen Description   Final    BLOOD LEFT ANTECUBITAL Performed at [facility], [place] [address], [place], [place] [address]    Special Requests   Final    BOTTLES DRAWN AEROBIC AND ANAEROBIC Blood Culture results may not be optimal due to an inadequate volume of blood received in culture bottles Performed at [facility] [address] [place], [place] [address]    Culture   Final    NO GROWTH 2 DAYS Performed at [facility] [address], [place], [place] [address]    Report Status PENDING  Incomplete  Culture, blood (Routine X 2) w Reflex to ID Panel     Status: None (Preliminary result)   Collection Time: 08/20/22 11:47 AM   Specimen: BLOOD  Result Value Ref Range Status   Specimen Description   Final    BLOOD LEFT ANTECUBITAL Performed at [facility] [address], [place], [place] [address]    Special Requests   Final    BOTTLES DRAWN AEROBIC AND ANAEROBIC Blood Culture results may not  be optimal due to an inadequate volume of blood received in culture bottles Performed at [facility] [address], [place], [place] [address]    Culture   Final    NO GROWTH 2 DAYS Performed at  [facility], [place] [address] [place], [place] [address]    Report Status PENDING  Incomplete  Surgical pcr screen     Status: Abnormal   Collection Time: 08/20/22  8:00 PM   Specimen: Nasal Mucosa; Nasal Swab  Result Value Ref Range Status   MRSA, PCR POSITIVE (A) NEGATIVE Final    Comment: CRITICAL RESULT CALLED TO, READ BACK BY AND VERIFIED WITH: Redmond School, RN 2145 MH    Staphylococcus aureus POSITIVE (A) NEGATIVE Final    Comment: (NOTE) The Xpert SA Assay (FDA approved for NASAL specimens in patients 73 years of age and older), is one component of a comprehensive surveillance program. It is not intended to diagnose infection nor to guide or monitor treatment. Performed at [facility], [place] [address] [place], [place] [address]      Radiology Studies: No results found.   Marzetta Board, MD, PhD [facility]  Between 7 am - 7 pm I am available, please contact me via Amion (for emergencies) or Securechat (non urgent messages)  Between 7 pm - 7 am I am not available, please contact night coverage MD/APP via Amion

## 2022-08-22 NOTE — Progress Notes (Signed)
PODIATRY PROGRESS NOTE  NAME Ariel Arnold MRN [id number] DOB Aug 15, 1956 DOA 08/19/2022   Reason for consult:  Chief Complaint  Patient presents with   Multiple Complaints     History of present illness: 66 y.o. female admitted to the hospital with wound infection of her left foot.  She is previous had multiple surgeries on her left foot, last with revisional transmetatarsal amputation on June 03, 2022 with Dr. Posey Pronto.  X-rays on admission showed cortical erosions in the second through fifth residual metatarsals consistent with acute osteomyelitis.  Patient is considering below-knee amputation.   Vitals:   08/21/22 1327 08/21/22 2224  BP: 132/64 (!) 125/59  Pulse: 75 91  Resp: 16 18  Temp: 98 F (36.7 C) 98.3 F (36.8 C)  SpO2: 98% 96%       Latest Ref Rng & Units 08/22/2022    4:02 AM 08/20/2022    2:29 AM 08/19/2022   11:28 PM  CBC  WBC 4.0 - 10.5 K/uL 3.0  2.7    Hemoglobin 12.0 - 15.0 g/dL 8.2  8.7  9.5   Hematocrit 36.0 - 46.0 % 25.2  26.4  28.0   Platelets 150 - 400 K/uL 88  91         Latest Ref Rng & Units 08/22/2022    4:02 AM 08/20/2022    2:27 AM 08/19/2022   11:28 PM  BMP  Glucose 70 - 99 mg/dL 143  324  173   BUN 8 - 23 mg/dL 21  22  28    Creatinine 0.44 - 1.00 mg/dL 0.80  0.93  0.70   Sodium 135 - 145 mmol/L 136  139  140   Potassium 3.5 - 5.1 mmol/L 3.9  3.4  4.5   Chloride 98 - 111 mmol/L 111  109  103   CO2 22 - 32 mmol/L 21  25    Calcium 8.9 - 10.3 mg/dL 7.8  8.1        Physical Exam: General: AAO x3, NAD   Dermatology: On the RIGHT foot superficial skin breakdown submetatarsal 1 without any surrounding cellulitis.  No fluctuation or crepitation present moderate.  On the left foot previous transmetatarsal potation with large wound at the amputation stump.  There is still residual edema and erythema but appears to be improved compared to prior pictures.  No fluctuance or crepitation.         Vascular: Pulses palpable  Neurological: Sensation  decreased  Musculoskeletal Exam: Transmetatarsal amputation left foot as well as right partial hallux amputation.  No pain on exam    ASSESSMENT/PLAN OF CARE  Osteomyelitis left foot with recurrent infection  -I again discussed different options with her including limb salvage versus further surgery to salvage the foot.  Discussed pros and cons of each.  After discussion she is open to below-knee amputation.  Awaiting orthopedic evaluation. Should she not proceed with this consider Lisfranc disarticulation, debridement.  She states that she is tired of coming in and out of the hospital for this and she is open to the below-knee amputation. Podiatry will continue to follow for now.     Please contact me directly with any questions or concerns.     Celesta Gentile, DPM [facility]  Dr. Bonna Gains. [address], [place] [address]  [place], [place] [address]                Office [phone]  Fax [phone]

## 2022-08-23 DIAGNOSIS — L089 Local infection of the skin and subcutaneous tissue, unspecified: Secondary | ICD-10-CM | POA: Diagnosis not present

## 2022-08-23 DIAGNOSIS — T148XXA Other injury of unspecified body region, initial encounter: Secondary | ICD-10-CM | POA: Diagnosis not present

## 2022-08-23 LAB — GLUCOSE, CAPILLARY
Glucose-Capillary: 91 mg/dL (ref 70–99)
Glucose-Capillary: 166 mg/dL — ABNORMAL HIGH (ref 70–99)
Glucose-Capillary: 161 mg/dL — ABNORMAL HIGH (ref 70–99)
Glucose-Capillary: 166 mg/dL — ABNORMAL HIGH (ref 70–99)

## 2022-08-23 NOTE — Progress Notes (Signed)
PROGRESS NOTE  Ariel Arnold:[id number] DOB: 1956-10-25 DOA: 08/19/2022 PCP: Jacqualine Code, DO   LOS: 3 days   Brief Narrative / Interim history: 66 year old female with DM2, HTN, NASH cirrhosis with prior hepatic encephalopathy, HTN, previous MRSA foot wound infection, status post transmetatarsal amputation in April 2023 due to osteomyelitis, came into the hospital for worsening left foot pain, redness, and discharge at home.  X-rays in the ED showed concern for acute osteomyelitis.  Pertinent data: Foot x-ray 9/1-cortical erosions and periosteal reaction second through fifth residual metatarsals consistent with acute osteomyelitis, soft tissue swelling about the prior amputation site  Subjective / 24h Interval events: No complaints, no pain.  Remains agreeable to BKA but asking me about timing and whether she will be able to leave for the beach this Sunday  Assesement and Plan: Principal Problem:   Infected wound Active Problems:   Liver cirrhosis secondary to NASH ([place])   Anemia in chronic kidney disease   Coronary atherosclerosis   Generalized anxiety disorder   Mixed hyperlipidemia   Type 2 diabetes mellitus treated with insulin (HCC)   Chronic osteomyelitis involving ankle and foot, left (HCC)   Lactic acidosis   Diabetic osteomyelitis ([place])  Principal problem Left wound infection/surrounding cellulitis, osteomyelitis-she was started on antibiotics with Vanco cefepime, continue.  It appears that she has failed multiple attempts to save the foot with persistent and recurrent infections and recurrent surgical interventions.  A BKA is recommended at this point, orthopedic surgery, Dr. Sharol Given, will see her to determine timing for BKA.   -ID and podiatry and also following -Cellulitis looks a whole lot better now  Active problems [name] cirrhosis-stable, appears at baseline, without evidence of hepatic encephalopathy  HTN-continue home Coreg.  She is normotensive this  morning  Chronic diastolic CHF-euvolemic currently.  2D echo June 2023 shows normal LVEF 44-96%, grade 2 diastolic dysfunction.  RV was normal.  Hypokalemia-recheck potassium in the morning  Pancytopenia-due to her underlying liver disease.  Continue to monitor WBC, hemoglobin, platelets.  Continue folic acid, iron  Hyperlipidemia-resume statin today  Type 2 diabetes mellitus-continue glargine, sliding scale, CBGs within range  CBG (last 3)  Recent Labs    08/22/22 1628 08/22/22 2138 08/23/22 0742  GLUCAP 160* 170* 91      Scheduled Meds:  carvedilol  6.25 mg Oral BID   Chlorhexidine Gluconate Cloth  6 each Topical Q0600   enoxaparin (LOVENOX) injection  40 mg Subcutaneous Q24H   ferrous sulfate  325 mg Oral BID WC   folic acid  1 mg Oral BID   insulin aspart  0-20 Units Subcutaneous TID WC   insulin aspart  0-5 Units Subcutaneous QHS   insulin glargine-yfgn  20 Units Subcutaneous QHS   mupirocin ointment  1 Application Nasal BID   pravastatin  20 mg Oral QHS   sertraline  150 mg Oral Daily   Continuous Infusions:  ceFEPime (MAXIPIME) IV 2 g (08/23/22 0758)   vancomycin 150 mL/hr at 08/23/22 0241   PRN Meds:.albuterol, gabapentin, morphine injection, ondansetron **OR** ondansetron (ZOFRAN) IV  Current Outpatient Medications  Medication Instructions   ACCU-CHEK GUIDE test strip No dose, route, or frequency recorded.   Accu-Chek Softclix Lancets lancets SMARTSIG:2 Topical Twice Daily   albuterol (VENTOLIN HFA) 108 (90 Base) MCG/ACT inhaler 2 puffs, Inhalation, Every 4 hours PRN   ascorbic acid (VITAMIN C) 500 mg, Oral, 2 times daily   Blood Glucose Monitoring Suppl (ACCU-CHEK GUIDE) w/Device KIT No dose, route, or frequency recorded.  Calcium Carbonate-Vit D-Min (CALCIUM 1200) 1200-1000 MG-UNIT CHEW 2 tablets, Oral, Daily   carvedilol (COREG) 6.25 mg, Oral, 2 times daily   clotrimazole-betamethasone (LOTRISONE) cream 1 Application, Topical, Daily PRN   diclofenac  Sodium (VOLTAREN) 2 g, Topical, 2 times daily   ferrous sulfate 325 mg, Oral, 2 times daily   folic acid (FOLVITE) 1 mg, Oral, 2 times daily   furosemide (LASIX) 20 mg, Oral, Weekly   gabapentin (NEURONTIN) 600 mg, Oral, 3 times daily PRN   GLOBAL EASE INJECT PEN NEEDLES 32G X 4 MM MISC No dose, route, or frequency recorded.   HumaLOG KwikPen 8 Units, Subcutaneous, 4 times daily, Per sliding scale   hydrOXYzine (ATARAX) 25 mg, Oral, Daily   ketorolac (ACULAR) 0.5 % ophthalmic solution 1 drop, Both Eyes, 4 times daily PRN   lactulose (CHRONULAC) 10 GM/15ML solution 30 mLs, Oral, 2 times daily, for ammonia levels   Oxycodone HCl 5 mg, Oral, Daily PRN   pantoprazole (PROTONIX) 40 mg, Oral, Daily PRN   pravastatin (PRAVACHOL) 20 mg, Oral, Daily at bedtime, Take after completion of daptomycin   sertraline (ZOLOFT) 200 mg, Oral, Daily   spironolactone (ALDACTONE) 50 mg, Oral, Daily PRN   Toujeo Max SoloStar 60 Units, Subcutaneous, Daily at bedtime   Vitamin D, Ergocalciferol, (DRISDOL) 1.25 MG (50000 UNIT) CAPS capsule 1 capsule, Oral, Every 7 days, Mondays    Diet Orders (From admission, onward)     Start     Ordered   08/20/22 1610  Diet Carb Modified Fluid consistency: Thin; Room service appropriate? Yes  Diet effective now       Question Answer Comment  Diet-HS Snack? Nothing   Calorie Level Medium 1600-2000   Fluid consistency: Thin   Room service appropriate? Yes      08/20/22 1609            DVT prophylaxis: SCD's Start: 08/20/22 1709 enoxaparin (LOVENOX) injection 40 mg Start: 08/20/22 1000   Lab Results  Component Value Date   PLT 88 (L) 08/22/2022      Code Status: Full Code  Family Communication: No family at bedside  Status is: Inpatient Remains inpatient appropriate because: Severity of illness   Level of care: Med-Surg  Consultants:  Podiatry Infectious disease   Objective: Vitals:   08/21/22 2224 08/22/22 1419 08/22/22 2144 08/23/22 0554  BP:  (!) 125/59 (!) 105/49 (!) 120/52 (!) 106/53  Pulse: 91 70 71 67  Resp: 18 18 18 18   Temp: 98.3 F (36.8 C) 98.4 F (36.9 C) 98.2 F (36.8 C) 97.8 F (36.6 C)  TempSrc: Oral Oral Oral Oral  SpO2: 96% 99% 100% 98%  Weight:      Height:        Intake/Output Summary (Last 24 hours) at 08/23/2022 1015 Last data filed at 08/23/2022 0902 Gross per 24 hour  Intake 1100 ml  Output 1150 ml  Net -50 ml    Wt Readings from Last 3 Encounters:  08/21/22 86.7 kg  06/02/22 85.6 kg  04/05/22 83.9 kg    Examination: Constitutional: NAD Eyes: lids and conjunctivae normal, no scleral icterus ENMT: mmm Neck: normal, supple Respiratory: clear to auscultation bilaterally, no wheezing, no crackles.  Cardiovascular: Regular rate and rhythm, no murmurs / rubs / gallops. No LE edema. Abdomen: soft, no distention, no tenderness. Bowel sounds positive.  Skin: no rashes Neurologic: no focal deficits, equal strength     Data Reviewed: I have independently reviewed following labs and imaging studies   CBC  Recent Labs  Lab 08/19/22 2328 08/20/22 0229 08/22/22 0402  WBC  --  2.7* 3.0*  HGB 9.5* 8.7* 8.2*  HCT 28.0* 26.4* 25.2*  PLT  --  91* 88*  MCV  --  97.8 97.7  MCH  --  32.2 31.8  MCHC  --  33.0 32.5  RDW  --  14.4 14.5  LYMPHSABS  --  0.7  --   MONOABS  --  0.2  --   EOSABS  --  0.2  --   BASOSABS  --  0.0  --      Recent Labs  Lab 08/19/22 2250 08/19/22 2328 08/20/22 0227 08/22/22 0402  NA 137 140 139 136  K 4.7 4.5 3.4* 3.9  CL 106 103 109 111  CO2 26  --  25 21*  GLUCOSE 173* 173* 324* 143*  BUN 24* 28* 22 21  CREATININE 0.82 0.70 0.93 0.80  CALCIUM 8.3*  --  8.1* 7.8*  AST 37  --  20 23  ALT 19  --  16 14  ALKPHOS 132*  --  104 96  BILITOT 1.6*  --  1.2 0.9  ALBUMIN 2.8*  --  2.4* 2.0*  MG  --   --   --  1.7  CRP 6.0*  --   --   --   LATICACIDVEN 2.2*  --  1.8  --       ------------------------------------------------------------------------------------------------------------------ No results for input(s): "CHOL", "HDL", "LDLCALC", "TRIG", "CHOLHDL", "LDLDIRECT" in the last 72 hours.  Lab Results  Component Value Date   HGBA1C 11.2 (H) 04/03/2022   ------------------------------------------------------------------------------------------------------------------ No results for input(s): "TSH", "T4TOTAL", "T3FREE", "THYROIDAB" in the last 72 hours.  Invalid input(s): "FREET3"  Cardiac Enzymes No results for input(s): "CKMB", "TROPONINI", "MYOGLOBIN" in the last 168 hours.  Invalid input(s): "CK" ------------------------------------------------------------------------------------------------------------------ No results found for: "BNP"  CBG: Recent Labs  Lab 08/22/22 0746 08/22/22 1147 08/22/22 1628 08/22/22 2138 08/23/22 0742  GLUCAP 103* 161* 160* 170* 91     Recent Results (from the past 240 hour(s))  Culture, blood (Routine X 2) w Reflex to ID Panel     Status: None (Preliminary result)   Collection Time: 08/20/22 11:47 AM   Specimen: BLOOD  Result Value Ref Range Status   Specimen Description   Final    BLOOD LEFT ANTECUBITAL Performed at [facility], [place] [address] [place], [place] [address]    Special Requests   Final    BOTTLES DRAWN AEROBIC AND ANAEROBIC Blood Culture results may not be optimal due to an inadequate volume of blood received in culture bottles Performed at [place] [address] [place], [place] [address]    Culture   Final    NO GROWTH 3 DAYS Performed at [facility], [place] [address], [place], [place] [address]    Report Status PENDING  Incomplete  Culture, blood (Routine X 2) w Reflex to ID Panel     Status: None (Preliminary result)   Collection Time: 08/20/22 11:47 AM   Specimen: BLOOD  Result Value Ref Range Status   Specimen Description    Final    BLOOD LEFT ANTECUBITAL Performed at [place] [address] [place], [place] [address]    Special Requests   Final    BOTTLES DRAWN AEROBIC AND ANAEROBIC Blood Culture results may not be optimal due to an inadequate volume of blood received in culture bottles Performed at [facility],  [place] [address], [place], [place] [address]    Culture   Final    NO GROWTH 3 DAYS Performed at [facility], [place] [address], [place], [place] [address]    Report Status PENDING  Incomplete  Surgical pcr screen     Status: Abnormal   Collection Time: 08/20/22  8:00 PM   Specimen: Nasal Mucosa; Nasal Swab  Result Value Ref Range Status   MRSA, PCR POSITIVE (A) NEGATIVE Final    Comment: CRITICAL RESULT CALLED TO, READ BACK BY AND VERIFIED WITH: Redmond School, RN 2145 MH    Staphylococcus aureus POSITIVE (A) NEGATIVE Final    Comment: (NOTE) The Xpert SA Assay (FDA approved for NASAL specimens in patients 9 years of age and older), is one component of a comprehensive surveillance program. It is not intended to diagnose infection nor to guide or monitor treatment. Performed at [facility], [place] [address] [place], [place] [address]      Radiology Studies: No results found.   Marzetta Board, MD, PhD [facility]  Between 7 am - 7 pm I am available, please contact me via Amion (for emergencies) or Securechat (non urgent messages)  Between 7 pm - 7 am I am not available, please contact night coverage MD/APP via Amion

## 2022-08-23 NOTE — Progress Notes (Signed)
Pharmacy Antibiotic Note  Ariel Arnold is a 66 y.o. female admitted on 08/19/2022 with osteomyelitis of left foot.  Pharmacy has been consulted for Vancomycin dosing.  In the ED patient has received Cefepime 2gm IV x 1 dose.  Plan: Continue vancomycin 1500 mg IV Q 24 hrs. Goal AUC 400-550.  Expected AUC: 444.6  SCr used: 0.8 (rounded up from 0.7) Cefepime 2 gr IV q8h  Follow renal function Monitor vancomycin levels as appropriate    Height: 5' 7"  (170.2 cm) Weight: 86.7 kg (191 lb 3.2 oz) IBW/kg (Calculated) : 61.6  Temp (24hrs), Avg:98.1 F (36.7 C), Min:97.8 F (36.6 C), Max:98.4 F (36.9 C)  Recent Labs  Lab 08/19/22 2250 08/19/22 2328 08/20/22 0227 08/20/22 0229 08/22/22 0402  WBC  --   --   --  2.7* 3.0*  CREATININE 0.82 0.70 0.93  --  0.80  LATICACIDVEN 2.2*  --  1.8  --   --      Estimated Creatinine Clearance: 76.1 mL/min (by C-G formula based on SCr of 0.8 mg/dL).    Allergies  Allergen Reactions   Chlorhexidine Rash   Tape Rash    Antimicrobials this admission: 9/2 Cefepime >>   9/2 Vancomycin >>    Dose adjustments this admission:    Microbiology results: 9/2 BCx: ngtd 9/2 MRSA PCR: positive   Thank you for allowing pharmacy to be a part of this patient's care.   Royetta Asal, PharmD, BCPS 08/23/2022 12:59 PM

## 2022-08-23 NOTE — Progress Notes (Signed)
Awaiting orthopedic evaluation for below-knee amputation.

## 2022-08-23 NOTE — Progress Notes (Signed)
    [facility] for Infectious Disease   Reason for visit: Follow up on osteomyelitis and cellulitis  Interval History: Dr. Sharol Given to evaluate  Physical Exam: Constitutional:  Vitals:   08/23/22 0554 08/23/22 1140  BP: (!) 106/53 (!) 119/57  Pulse: 67 68  Resp: 18 18  Temp: 97.8 F (36.6 C) 97.8 F (36.6 C)  SpO2: 98% 98%   patient appears in NAD  Impression/Plan: Stable, continue antibiotics for now pending surgical management

## 2022-08-24 ENCOUNTER — Ambulatory Visit: Payer: Medicare Other | Admitting: Podiatry

## 2022-08-24 DIAGNOSIS — Z794 Long term (current) use of insulin: Secondary | ICD-10-CM | POA: Diagnosis not present

## 2022-08-24 DIAGNOSIS — M86172 Other acute osteomyelitis, left ankle and foot: Secondary | ICD-10-CM

## 2022-08-24 DIAGNOSIS — M86672 Other chronic osteomyelitis, left ankle and foot: Secondary | ICD-10-CM | POA: Diagnosis not present

## 2022-08-24 DIAGNOSIS — E1169 Type 2 diabetes mellitus with other specified complication: Secondary | ICD-10-CM | POA: Diagnosis not present

## 2022-08-24 LAB — CBC
HCT: 29.7 % — ABNORMAL LOW (ref 36.0–46.0)
Hemoglobin: 9.6 g/dL — ABNORMAL LOW (ref 12.0–15.0)
MCH: 31.6 pg (ref 26.0–34.0)
MCHC: 32.3 g/dL (ref 30.0–36.0)
MCV: 97.7 fL (ref 80.0–100.0)
Platelets: 90 10*3/uL — ABNORMAL LOW (ref 150–400)
RBC: 3.04 MIL/uL — ABNORMAL LOW (ref 3.87–5.11)
RDW: 14.4 % (ref 11.5–15.5)
WBC: 4.1 10*3/uL (ref 4.0–10.5)
nRBC: 0 % (ref 0.0–0.2)

## 2022-08-24 LAB — BASIC METABOLIC PANEL
Anion gap: 3 — ABNORMAL LOW (ref 5–15)
BUN: 22 mg/dL (ref 8–23)
CO2: 25 mmol/L (ref 22–32)
Calcium: 8.5 mg/dL — ABNORMAL LOW (ref 8.9–10.3)
Chloride: 108 mmol/L (ref 98–111)
Creatinine, Ser: 0.76 mg/dL (ref 0.44–1.00)
GFR, Estimated: >60 mL/min (ref >60)
Glucose, Bld: 199 mg/dL — ABNORMAL HIGH (ref 70–99)
Potassium: 3.8 mmol/L (ref 3.5–5.1)
Sodium: 136 mmol/L (ref 135–145)

## 2022-08-24 LAB — GLUCOSE, CAPILLARY
Glucose-Capillary: 188 mg/dL — ABNORMAL HIGH (ref 70–99)
Glucose-Capillary: 176 mg/dL — ABNORMAL HIGH (ref 70–99)
Glucose-Capillary: 197 mg/dL — ABNORMAL HIGH (ref 70–99)
Glucose-Capillary: 112 mg/dL — ABNORMAL HIGH (ref 70–99)

## 2022-08-24 LAB — MAGNESIUM: Magnesium: 1.8 mg/dL (ref 1.7–2.4)

## 2022-08-24 NOTE — Progress Notes (Signed)
[facility] for Infectious Disease   Reason for visit: Follow up on osteomyelitis  Interval History: blood cultures remain negative; previous cultures with MRSA.  WBC 4.1.  remains afebrile.  Day 6 total antibiotics  Physical Exam: Constitutional:  Vitals:   08/24/22 0500 08/24/22 1307  BP: (!) 110/53 (!) 109/54  Pulse: 71 69  Resp: 18 18  Temp: (!) 97.4 F (36.3 C) (!) 97.4 F (36.3 C)  SpO2: 100% 99%   patient appears in NAD Respiratory: Normal respiratory effort  Review of Systems: Constitutional: negative for fevers and chills  Lab Results  Component Value Date   WBC 4.1 08/24/2022   HGB 9.6 (L) 08/24/2022   HCT 29.7 (L) 08/24/2022   MCV 97.7 08/24/2022   PLT 90 (L) 08/24/2022    Lab Results  Component Value Date   CREATININE 0.76 08/24/2022   BUN 22 08/24/2022   NA 136 08/24/2022   K 3.8 08/24/2022   CL 108 08/24/2022   CO2 25 08/24/2022    Lab Results  Component Value Date   ALT 14 08/22/2022   AST 23 08/22/2022   ALKPHOS 96 08/22/2022     Microbiology: Recent Results (from the past 240 hour(s))  Culture, blood (Routine X 2) w Reflex to ID Panel     Status: None (Preliminary result)   Collection Time: 08/20/22 11:47 AM   Specimen: BLOOD  Result Value Ref Range Status   Specimen Description   Final    BLOOD LEFT ANTECUBITAL Performed at [facility], [place] [address] [place], [place] [address]    Special Requests   Final    BOTTLES DRAWN AEROBIC AND ANAEROBIC Blood Culture results may not be optimal due to an inadequate volume of blood received in culture bottles Performed at [place] [address], [place], [place] [address]    Culture   Final    NO GROWTH 4 DAYS Performed at [facility], [place] [address] [place], [place] [address]    Report Status PENDING  Incomplete  Culture, blood (Routine X 2) w Reflex to ID Panel     Status: None (Preliminary result)   Collection Time: 08/20/22  11:47 AM   Specimen: BLOOD  Result Value Ref Range Status   Specimen Description   Final    BLOOD LEFT ANTECUBITAL Performed at [place] [address], [place], [place] [address]    Special Requests   Final    BOTTLES DRAWN AEROBIC AND ANAEROBIC Blood Culture results may not be optimal due to an inadequate volume of blood received in culture bottles Performed at [place] [address] [place], [place] [address]    Culture   Final    NO GROWTH 4 DAYS Performed at [facility], [place] [address] [place], [place] [address]    Report Status PENDING  Incomplete  Surgical pcr screen     Status: Abnormal   Collection Time: 08/20/22  8:00 PM   Specimen: Nasal Mucosa; Nasal Swab  Result Value Ref Range Status   MRSA, PCR POSITIVE (A) NEGATIVE Final    Comment: CRITICAL RESULT CALLED TO, READ BACK BY AND VERIFIED WITH: Redmond School, RN 2145 MH    Staphylococcus aureus POSITIVE (A) NEGATIVE Final    Comment: (NOTE) The Xpert SA Assay (FDA approved for NASAL specimens in patients 19 years of age and older), is one component of a comprehensive surveillance program. It is not intended  to diagnose infection nor to guide or monitor treatment. Performed at [facility], [place] [address], [place], [place] [address]     Impression/Plan:  1. Osteomyelitis - plan for definitive treatment noted and I discussed with the patient who continues to be agreeable.  I will have her continue with vancomycin now pending surgery and she can stop vancomycin 24 hours after surgery.  No further antibiotics indicated after that.   2.  DM - discussed the importance of good blood sugar control to reduce her risk of infections.   ID will sign off, call with any changes.

## 2022-08-24 NOTE — TOC Progression Note (Signed)
Transition of Care [facility]) - Progression Note    Patient Details  Name: Ariel Arnold MRN: [id number] Date of Birth: [birth year]  [facility] CM/SW Contact  Purcell Mouton, RN Phone Number: 08/24/2022, 1:08 PM  Clinical Narrative:    Spoke with pt who states she is not sure at present time if she will go home or SNF. Pt is from [place] and may transfer to [facility].    Expected Discharge Plan: [place] Barriers to Discharge: No Barriers Identified  Expected Discharge Plan and Services Expected Discharge Plan: [place] arrangements for the past 2 months: Single Family Home                                       Social Determinants of Health (SDOH) Interventions    Readmission Risk Interventions    11/16/2021    9:27 AM  Readmission Risk Prevention Plan  Home Care Screening Complete  Medication Review (RN CM) Complete

## 2022-08-24 NOTE — Progress Notes (Signed)
   PODIATRY PROGRESS NOTE  NAME Ariel Arnold MRN [id number] DOB 1956-01-31 DOA 08/19/2022   Reason for consult:  Chief Complaint  Patient presents with   Multiple Complaints     History of present illness: 66 y.o. female admitted to the hospital with wound infection of her left foot.  She is previous had multiple surgeries on her left foot, last with revisional transmetatarsal amputation on June 03, 2022 with Dr. Posey Pronto.  X-rays on admission showed cortical erosions in the second through fifth residual metatarsals consistent with acute osteomyelitis.  Patient is considering below-knee amputation.  This is scheduled for Friday  Vitals:   08/21/22 1327 08/21/22 2224  BP: 132/64 (!) 125/59  Pulse: 75 91  Resp: 16 18  Temp: 98 F (36.7 C) 98.3 F (36.8 C)  SpO2: 98% 96%       Latest Ref Rng & Units 08/22/2022    4:02 AM 08/20/2022    2:29 AM 08/19/2022   11:28 PM  CBC  WBC 4.0 - 10.5 K/uL 3.0  2.7    Hemoglobin 12.0 - 15.0 g/dL 8.2  8.7  9.5   Hematocrit 36.0 - 46.0 % 25.2  26.4  28.0   Platelets 150 - 400 K/uL 88  91         Latest Ref Rng & Units 08/22/2022    4:02 AM 08/20/2022    2:27 AM 08/19/2022   11:28 PM  BMP  Glucose 70 - 99 mg/dL 143  324  173   BUN 8 - 23 mg/dL 21  22  28    Creatinine 0.44 - 1.00 mg/dL 0.80  0.93  0.70   Sodium 135 - 145 mmol/L 136  139  140   Potassium 3.5 - 5.1 mmol/L 3.9  3.4  4.5   Chloride 98 - 111 mmol/L 111  109  103   CO2 22 - 32 mmol/L 21  25    Calcium 8.9 - 10.3 mg/dL 7.8  8.1        Physical Exam: General: AAO x3, NAD   Dermatology: On the RIGHT foot superficial skin breakdown submetatarsal 1 without any surrounding cellulitis.  No fluctuation or crepitation present moderate.  On the LEFT foot previous transmetatarsal potation with large wound at the amputation stump.  There is no purulence noted.  There is no fluctuation or crepitation.  Vascular: Pulses palpable  Neurological: Sensation decreased  Musculoskeletal Exam:  Transmetatarsal amputation left foot as well as right partial hallux amputation.  No pain on exam    ASSESSMENT/PLAN OF CARE  Osteomyelitis left foot with recurrent infection  -Continue antibiotics for now.  Awaiting surgery on Friday for below-knee amputation with orthopedic surgery. -At this time podiatry to sign off.  Please let us know if we can be of further assistance. -On the right foot apply Medihoney followed by dressing daily.  Orders to be placed with this.  Please contact me directly with any questions or concerns.     Celesta Gentile, DPM [facility]  Dr. Bonna Gains. [address] [place], [place] [address] [place], [place] [address]                Office [phone]  Fax [phone]

## 2022-08-24 NOTE — Progress Notes (Signed)
PROGRESS NOTE    Ariel Arnold  YTK:[id number] DOB: 1956-07-16 DOA: 08/19/2022 PCP: Jacqualine Code, DO     Brief Narrative:  Ariel Arnold is a 66 year old female with DM2, HTN, NASH cirrhosis with prior hepatic encephalopathy, HTN, previous MRSA foot wound infection, status post transmetatarsal amputation in April 2023 due to osteomyelitis, came into the hospital for worsening left foot pain, redness, and discharge at home.  X-rays in the ED showed concern for acute osteomyelitis. Podiatry and orthopedic surgery consulted, recommended evaluation by Dr. Sharol Given for BKA.   New events last 24 hours / Subjective: I sent a message to Dr. Sharol Given, planning for BKA on Friday and to transfer to [facility].  I relayed this message to the patient.  She is disappointed that she will not be able to go to her beach trip on Sunday.  Assessment & Plan:   Principal Problem:   Chronic osteomyelitis involving ankle and foot, left (HCC) Active Problems:   Liver cirrhosis secondary to NASH (HCC)   Thrombocytopenia (HCC)   Coronary atherosclerosis   Generalized anxiety disorder   Mixed hyperlipidemia   Type 2 diabetes mellitus treated with insulin (HCC)   Essential hypertension   Lactic acidosis   Diabetic osteomyelitis (HCC)   Left lower extremity osteomyelitis -Vancomycin, cefepime -ID following -Spoke with Dr. Duda, transfer to [facility] today for surgical evaluation.  Tentative surgery Friday  NASH cirrhosis with pancytopenia -Stable  Hypertension -Coreg  Chronic diastolic heart failure -Without exacerbation  Hyperlipidemia -Pravachol  Diabetes mellitus type 2 -Semglee, sliding scale insulin  DVT prophylaxis:  SCD's Start: 08/20/22 1709 enoxaparin (LOVENOX) injection 40 mg Start: 08/20/22 1000  Code Status: Full code Family Communication: No family at bedside Disposition Plan:  Status is: Inpatient Remains inpatient appropriate because: OR Friday  Consultants:   Podiatry ID Orthopedic surgery   Antimicrobials:  Anti-infectives (From admission, onward)    Start     Dose/Rate Route Frequency Ordered Stop   08/21/22 0100  vancomycin (VANCOREADY) IVPB 1500 mg/300 mL        1,500 mg 150 mL/hr over 120 Minutes Intravenous Every 24 hours 08/20/22 0050     08/20/22 0800  ceFEPIme (MAXIPIME) 2 g in sodium chloride 0.9 % 100 mL IVPB        2 g 200 mL/hr over 30 Minutes Intravenous Every 8 hours 08/20/22 0033     09 /02/23 0015  vancomycin (VANCOREADY) IVPB 1500 mg/300 mL        1,500 mg 150 mL/hr over 120 Minutes Intravenous  Once 08/20/22 0012 08/20/22 0300   08/19/22 2345  vancomycin (VANCOCIN) IVPB 1000 mg/200 mL premix  Status:  Discontinued        1,000 mg 200 mL/hr over 60 Minutes Intravenous  Once 08/19/22 2334 08/20/22 0012   08/19/22 2345  ceFEPIme (MAXIPIME) 2 g in sodium chloride 0.9 % 100 mL IVPB        2 g 200 mL/hr over 30 Minutes Intravenous  Once 08/19/22 2334 08/20/22 0030        Objective: Vitals:   08/23/22 0554 08/23/22 1140 08/23/22 2141 08/24/22 0500  BP: (!) 106/53 (!) 119/57 (!) 121/50 (!) 110/53  Pulse: 67 68 72 71  Resp: 18 18 18 18   Temp: 97.8 F (36.6 C) 97.8 F (36.6 C) 98 F (36.7 C) (!) 97.4 F (36.3 C)  TempSrc: Oral Oral Oral Oral  SpO2: 98% 98% 99% 100%  Weight:      Height:  Intake/Output Summary (Last 24 hours) at 08/24/2022 1037 Last data filed at 08/24/2022 1000 Gross per 24 hour  Intake 1840.06 ml  Output 2400 ml  Net -559.94 ml   Filed Weights   08/19/22 2103 08/21/22 0550  Weight: 79.8 kg 86.7 kg    Examination:  General exam: Appears calm and comfortable  Respiratory system: Clear to auscultation. Respiratory effort normal. No respiratory distress. No conversational dyspnea.  Cardiovascular system: S1 & S2 heard, RRR. No murmurs. No pedal edema. Gastrointestinal system: Abdomen is nondistended, soft and nontender. Normal bowel sounds heard. Central nervous system: Alert and  oriented. No focal neurological deficits. Speech clear.  Extremities: Left foot in dry and clean dressing Psychiatry: Judgement and insight appear normal. Mood & affect appropriate.   Data Reviewed: I have personally reviewed following labs and imaging studies  CBC: Recent Labs  Lab 08/19/22 2328 08/20/22 0229 08/22/22 0402 08/24/22 0441  WBC  --  2.7* 3.0* 4.1  NEUTROABS  --  1.6*  --   --   HGB 9.5* 8.7* 8.2* 9.6*  HCT 28.0* 26.4* 25.2* 29.7*  MCV  --  97.8 97.7 97.7  PLT  --  91* 88* 90*   Basic Metabolic Panel: Recent Labs  Lab 08/19/22 2250 08/19/22 2328 08/20/22 0227 08/22/22 0402 08/24/22 0441  NA 137 140 139 136 136  K 4.7 4.5 3.4* 3.9 3.8  CL 106 103 109 111 108  CO2 26  --  25 21* 25  GLUCOSE 173* 173* 324* 143* 199*  BUN 24* 28* 22 21 22   CREATININE 0.82 0.70 0.93 0.80 0.76  CALCIUM 8.3*  --  8.1* 7.8* 8.5*  MG  --   --   --  1.7 1.8  PHOS  --   --   --  2.6  --    GFR: Estimated Creatinine Clearance: 76.1 mL/min (by C-G formula based on SCr of 0.76 mg/dL). Liver Function Tests: Recent Labs  Lab 08/19/22 2250 08/20/22 0227 08/22/22 0402  AST 37 20 23  ALT 19 16 14   ALKPHOS 132* 104 96  BILITOT 1.6* 1.2 0.9  PROT 7.8 6.5 5.8*  ALBUMIN 2.8* 2.4* 2.0*   No results for input(s): "LIPASE", "AMYLASE" in the last 168 hours. No results for input(s): "AMMONIA" in the last 168 hours. Coagulation Profile: No results for input(s): "INR", "PROTIME" in the last 168 hours. Cardiac Enzymes: No results for input(s): "CKTOTAL", "CKMB", "CKMBINDEX", "TROPONINI" in the last 168 hours. BNP (last 3 results) No results for input(s): "PROBNP" in the last 8760 hours. HbA1C: No results for input(s): "HGBA1C" in the last 72 hours. CBG: Recent Labs  Lab 08/23/22 0742 08/23/22 1139 08/23/22 1655 08/23/22 2136 08/24/22 0745  GLUCAP 91 166* 161* 166* 188*   Lipid Profile: No results for input(s): "CHOL", "HDL", "LDLCALC", "TRIG", "CHOLHDL", "LDLDIRECT" in the  last 72 hours. Thyroid Function Tests: No results for input(s): "TSH", "T4TOTAL", "FREET4", "T3FREE", "THYROIDAB" in the last 72 hours. Anemia Panel: No results for input(s): "VITAMINB12", "FOLATE", "FERRITIN", "TIBC", "IRON", "RETICCTPCT" in the last 72 hours. Sepsis Labs: Recent Labs  Lab 08/19/22 2250 08/20/22 0227  LATICACIDVEN 2.2* 1.8    Recent Results (from the past 240 hour(s))  Culture, blood (Routine X 2) w Reflex to ID Panel     Status: None (Preliminary result)   Collection Time: 08/20/22 11:47 AM   Specimen: BLOOD  Result Value Ref Range Status   Specimen Description   Final    BLOOD LEFT ANTECUBITAL Performed at [facility]  [facility], [place] [address] [place], [place] [address]    Special Requests   Final    BOTTLES DRAWN AEROBIC AND ANAEROBIC Blood Culture results may not be optimal due to an inadequate volume of blood received in culture bottles Performed at [place] [address], [place], [place] [address]    Culture   Final    NO GROWTH 4 DAYS Performed at [facility], [place] [address] [place], [place] [address]    Report Status PENDING  Incomplete  Culture, blood (Routine X 2) w Reflex to ID Panel     Status: None (Preliminary result)   Collection Time: 08/20/22 11:47 AM   Specimen: BLOOD  Result Value Ref Range Status   Specimen Description   Final    BLOOD LEFT ANTECUBITAL Performed at [place] [address], [place], [place] [address]    Special Requests   Final    BOTTLES DRAWN AEROBIC AND ANAEROBIC Blood Culture results may not be optimal due to an inadequate volume of blood received in culture bottles Performed at [place] [address], [place], [place] [address]    Culture   Final    NO GROWTH 4 DAYS Performed at [facility], [place] [address], [place],  [address]    Report Status PENDING  Incomplete  Surgical pcr screen     Status: Abnormal    Collection Time: 08/20/22  8:00 PM   Specimen: Nasal Mucosa; Nasal Swab  Result Value Ref Range Status   MRSA, PCR POSITIVE (A) NEGATIVE Final    Comment: CRITICAL RESULT CALLED TO, READ BACK BY AND VERIFIED WITH: Redmond School, RN 2145 MH    Staphylococcus aureus POSITIVE (A) NEGATIVE Final    Comment: (NOTE) The Xpert SA Assay (FDA approved for NASAL specimens in patients 25 years of age and older), is one component of a comprehensive surveillance program. It is not intended to diagnose infection nor to guide or monitor treatment. Performed at [facility], [place] [address], [place],  [address]       Radiology Studies: No results found.    Scheduled Meds:  carvedilol  6.25 mg Oral BID   Chlorhexidine Gluconate Cloth  6 each Topical Q0600   enoxaparin (LOVENOX) injection  40 mg Subcutaneous Q24H   ferrous sulfate  325 mg Oral BID WC   folic acid  1 mg Oral BID   insulin aspart  0-20 Units Subcutaneous TID WC   insulin aspart  0-5 Units Subcutaneous QHS   insulin glargine-yfgn  20 Units Subcutaneous QHS   mupirocin ointment  1 Application Nasal BID   pravastatin  20 mg Oral QHS   sertraline  150 mg Oral Daily   Continuous Infusions:  ceFEPime (MAXIPIME) IV 2 g (08/24/22 5038)   vancomycin 1,500 mg (08/24/22 0011)     LOS: 4 days     Dessa Phi, DO [facility] 08/24/2022, 10:37 AM   Available via Epic secure chat 7am-7pm After these hours, please refer to coverage provider listed on [email or web address]

## 2022-08-25 DIAGNOSIS — M86672 Other chronic osteomyelitis, left ankle and foot: Secondary | ICD-10-CM | POA: Diagnosis not present

## 2022-08-25 LAB — CULTURE, BLOOD (ROUTINE X 2)
Culture: NO GROWTH
Culture: NO GROWTH

## 2022-08-25 LAB — GLUCOSE, CAPILLARY
Glucose-Capillary: 196 mg/dL — ABNORMAL HIGH (ref 70–99)
Glucose-Capillary: 106 mg/dL — ABNORMAL HIGH (ref 70–99)
Glucose-Capillary: 245 mg/dL — ABNORMAL HIGH (ref 70–99)
Glucose-Capillary: 191 mg/dL — ABNORMAL HIGH (ref 70–99)

## 2022-08-25 MED ORDER — MEDIHONEY WOUND/BURN DRESSING EX PSTE
1.0000 | PASTE | Freq: Every day | CUTANEOUS | Status: DC
  Administered 2022-08-25 – 2022-08-31 (×6): 1 via TOPICAL
  Filled 2022-08-25 (×2): qty 44

## 2022-08-25 NOTE — TOC Progression Note (Signed)
Transition of Care [facility]) - Progression Note    Patient Details  Name: Ariel Arnold MRN: [id number] Date of Birth: [birth year]  Transition of [facility]) CM/SW Contact  Cyndi Bender, RN Phone Number: 08/25/2022, 4:20 PM  Clinical Narrative:    Patient transferred to [facility] and orthopedic surgery consulted, recommended evaluation by Dr. Sharol Given for [place].  Dr. Sharol Given planning for surgical intervention 9/8.   TOC will continue to follow patient for transition needs post surgery.   Expected Discharge Plan: [place] Barriers to Discharge: No Barriers Identified  Expected Discharge Plan and Services Expected Discharge Plan: [place] arrangements for the past 2 months: Single Family Home                                       Social Determinants of Health (SDOH) Interventions    Readmission Risk Interventions    11/16/2021    9:27 AM  Readmission Risk Prevention Plan  Home Care Screening Complete  Medication Review (RN CM) Complete

## 2022-08-25 NOTE — Progress Notes (Addendum)
Called [facility], [place], to inform on-call that patient has been refusing to sign consent as she has several questions to ask surgeon first. Per Alyse Low, she will page MD to give this RN a call back.

## 2022-08-25 NOTE — Progress Notes (Signed)
PROGRESS NOTE    Ariel Arnold  ALP:[id number] DOB: Sep 12, 1956 DOA: 08/19/2022 PCP: Jacqualine Code, DO     Brief Narrative:  Ariel Arnold is a 66 year old female with DM2, HTN, NASH cirrhosis with prior hepatic encephalopathy, HTN, previous MRSA foot wound infection, status post transmetatarsal amputation in April 2023 due to osteomyelitis, came into the hospital for worsening left foot pain, redness, and discharge at home.  X-rays in the ED showed concern for acute osteomyelitis. Podiatry and orthopedic surgery consulted, recommended evaluation by Dr. Sharol Given for BKA.  Dr. Sharol Given planning for surgical intervention 9/8  New events last 24 hours / Subjective: Patient awaiting transfer to [place]:   Principal Problem:   Chronic osteomyelitis involving ankle and foot, left ([place]) Active Problems:   Liver cirrhosis secondary to NASH ([place])   Thrombocytopenia ([place])   Coronary atherosclerosis   Generalized anxiety disorder   Mixed hyperlipidemia   Type 2 diabetes mellitus treated with insulin (HCC)   Essential hypertension   Lactic acidosis   Diabetic osteomyelitis (HCC)   Left lower extremity osteomyelitis -Vancomycin, cefepime -ID following -Spoke with Dr. Sharol Given 9/6, transfer to [facility] for surgical evaluation.  Tentative surgery Friday  NASH cirrhosis with pancytopenia -Stable  Hypertension -Coreg  Chronic diastolic heart failure -Without exacerbation  Hyperlipidemia -Pravachol  Diabetes mellitus type 2 -Semglee, sliding scale insulin  DVT prophylaxis:  SCD's Start: 08/20/22 1709 enoxaparin (LOVENOX) injection 40 mg Start: 08/20/22 1000  Code Status: Full code Family Communication: No family at bedside Disposition Plan:  Status is: Inpatient Remains inpatient appropriate because: OR Friday  Consultants:  Podiatry ID Orthopedic surgery   Antimicrobials:  Anti-infectives (From admission, onward)    Start     Dose/Rate Route  Frequency Ordered Stop   08/21/22 0100  vancomycin (VANCOREADY) IVPB 1500 mg/300 mL        1,500 mg 150 mL/hr over 120 Minutes Intravenous Every 24 hours 08/20/22 0050     08/20/22 0800  ceFEPIme (MAXIPIME) 2 g in sodium chloride 0.9 % 100 mL IVPB  Status:  Discontinued        2 g 200 mL/hr over 30 Minutes Intravenous Every 8 hours 08/20/22 0033 08/24/22 1205   08/20/22 0015  vancomycin (VANCOREADY) IVPB 1500 mg/300 mL        1,500 mg 150 mL/hr over 120 Minutes Intravenous  Once 08/20/22 0012 08/20/22 0300   08/19/22 2345  vancomycin (VANCOCIN) IVPB 1000 mg/200 mL premix  Status:  Discontinued        1,000 mg 200 mL/hr over 60 Minutes Intravenous  Once 08/19/22 2334 08/20/22 0012   08/19/22 2345  ceFEPIme (MAXIPIME) 2 g in sodium chloride 0.9 % 100 mL IVPB        2 g 200 mL/hr over 30 Minutes Intravenous  Once 08/19/22 2334 08/20/22 0030        Objective: Vitals:   08/24/22 0500 08/24/22 1307 08/24/22 2153 08/25/22 0652  BP: (!) 110/53 (!) 109/54 (!) 121/50 (!) 124/52  Pulse: 71 69 69 65  Resp: 18 18 18 18   Temp: (!) 97.4 F (36.3 C) (!) 97.4 F (36.3 C) 97.6 F (36.4 C) 98 F (36.7 C)  TempSrc: Oral Oral Oral Oral  SpO2: 100% 99% 98% 99%  Weight:      Height:        Intake/Output Summary (Last 24 hours) at 08/25/2022 0943 Last data filed at 08/25/2022 0900 Gross per 24 hour  Intake 1589.99 ml  Output 2800 ml  Net -1210.01 ml    Filed Weights   08/19/22 2103 08/21/22 0550  Weight: 79.8 kg 86.7 kg    Examination:  General exam: Appears calm and comfortable  Respiratory system: Clear to auscultation. Respiratory effort normal. No respiratory distress. No conversational dyspnea.  Cardiovascular system: S1 & S2 heard, RRR. No murmurs. No pedal edema. Gastrointestinal system: Abdomen is nondistended, soft and nontender. Normal bowel sounds heard. Central nervous system: Alert and oriented. No focal neurological deficits. Speech clear.  Extremities: Left foot in dry and  clean dressing Psychiatry: Judgement and insight appear normal. Mood & affect appropriate.   Data Reviewed: I have personally reviewed following labs and imaging studies  CBC: Recent Labs  Lab 08/19/22 2328 08/20/22 0229 08/22/22 0402 08/24/22 0441  WBC  --  2.7* 3.0* 4.1  NEUTROABS  --  1.6*  --   --   HGB 9.5* 8.7* 8.2* 9.6*  HCT 28.0* 26.4* 25.2* 29.7*  MCV  --  97.8 97.7 97.7  PLT  --  91* 88* 90*    Basic Metabolic Panel: Recent Labs  Lab 08/19/22 2250 08/19/22 2328 08/20/22 0227 08/22/22 0402 08/24/22 0441  NA 137 140 139 136 136  K 4.7 4.5 3.4* 3.9 3.8  CL 106 103 109 111 108  CO2 26  --  25 21* 25  GLUCOSE 173* 173* 324* 143* 199*  BUN 24* 28* 22 21 22   CREATININE 0.82 0.70 0.93 0.80 0.76  CALCIUM 8.3*  --  8.1* 7.8* 8.5*  MG  --   --   --  1.7 1.8  PHOS  --   --   --  2.6  --     GFR: Estimated Creatinine Clearance: 76.1 mL/min (by C-G formula based on SCr of 0.76 mg/dL). Liver Function Tests: Recent Labs  Lab 08/19/22 2250 08/20/22 0227 08/22/22 0402  AST 37 20 23  ALT 19 16 14   ALKPHOS 132* 104 96  BILITOT 1.6* 1.2 0.9  PROT 7.8 6.5 5.8*  ALBUMIN 2.8* 2.4* 2.0*    No results for input(s): "LIPASE", "AMYLASE" in the last 168 hours. No results for input(s): "AMMONIA" in the last 168 hours. Coagulation Profile: No results for input(s): "INR", "PROTIME" in the last 168 hours. Cardiac Enzymes: No results for input(s): "CKTOTAL", "CKMB", "CKMBINDEX", "TROPONINI" in the last 168 hours. BNP (last 3 results) No results for input(s): "PROBNP" in the last 8760 hours. HbA1C: No results for input(s): "HGBA1C" in the last 72 hours. CBG: Recent Labs  Lab 08/24/22 0745 08/24/22 1133 08/24/22 1633 08/24/22 2145 08/25/22 0736  GLUCAP 188* 176* 197* 112* 196*    Lipid Profile: No results for input(s): "CHOL", "HDL", "LDLCALC", "TRIG", "CHOLHDL", "LDLDIRECT" in the last 72 hours. Thyroid Function Tests: No results for input(s): "TSH", "T4TOTAL",  "FREET4", "T3FREE", "THYROIDAB" in the last 72 hours. Anemia Panel: No results for input(s): "VITAMINB12", "FOLATE", "FERRITIN", "TIBC", "IRON", "RETICCTPCT" in the last 72 hours. Sepsis Labs: Recent Labs  Lab 08/19/22 2250 08/20/22 0227  LATICACIDVEN 2.2* 1.8     Recent Results (from the past 240 hour(s))  Culture, blood (Routine X 2) w Reflex to ID Panel     Status: None   Collection Time: 08/20/22 11:47 AM   Specimen: BLOOD  Result Value Ref Range Status   Specimen Description   Final    BLOOD LEFT ANTECUBITAL Performed at [facility], [place] [address], [place], [place] [address]    Special Requests   Final  BOTTLES DRAWN AEROBIC AND ANAEROBIC Blood Culture results may not be optimal due to an inadequate volume of blood received in culture bottles Performed at [facility], [place] [address], [place], [place] [address]    Culture   Final    NO GROWTH 5 DAYS Performed at [facility], [place] [address], [place], [place] [address]    Report Status 08/25/2022 FINAL  Final  Culture, blood (Routine X 2) w Reflex to ID Panel     Status: None   Collection Time: 08/20/22 11:47 AM   Specimen: BLOOD  Result Value Ref Range Status   Specimen Description   Final    BLOOD LEFT ANTECUBITAL Performed at [place] [address] [place], [place] [address]    Special Requests   Final    BOTTLES DRAWN AEROBIC AND ANAEROBIC Blood Culture results may not be optimal due to an inadequate volume of blood received in culture bottles Performed at [place] [address], [place], [place] [address]    Culture   Final    NO GROWTH 5 DAYS Performed at [facility], [place] [address], [place], [place] [address]    Report Status 08/25/2022 FINAL  Final  Surgical pcr screen     Status: Abnormal   Collection Time: 08/20/22  8:00 PM   Specimen: Nasal Mucosa; Nasal Swab  Result Value Ref Range Status   MRSA,  PCR POSITIVE (A) NEGATIVE Final    Comment: CRITICAL RESULT CALLED TO, READ BACK BY AND VERIFIED WITH: Redmond School, RN 2145 MH    Staphylococcus aureus POSITIVE (A) NEGATIVE Final    Comment: (NOTE) The Xpert SA Assay (FDA approved for NASAL specimens in patients 19 years of age and older), is one component of a comprehensive surveillance program. It is not intended to diagnose infection nor to guide or monitor treatment. Performed at [facility], [place] [address] [place], [place] [address]       Radiology Studies: No results found.    Scheduled Meds:  carvedilol  6.25 mg Oral BID   enoxaparin (LOVENOX) injection  40 mg Subcutaneous Q24H   ferrous sulfate  325 mg Oral BID WC   folic acid  1 mg Oral BID   insulin aspart  0-20 Units Subcutaneous TID WC   insulin aspart  0-5 Units Subcutaneous QHS   insulin glargine-yfgn  20 Units Subcutaneous QHS   leptospermum manuka honey  1 Application Topical Daily   pravastatin  20 mg Oral QHS   sertraline  150 mg Oral Daily   Continuous Infusions:  vancomycin 1,500 mg (08/25/22 0108)     LOS: 5 days     Dessa Phi, DO [facility] 08/25/2022, 9:43 AM   Available via Epic secure chat 7am-7pm After these hours, please refer to coverage provider listed on [email or web address]

## 2022-08-26 ENCOUNTER — Other Ambulatory Visit: Payer: Self-pay

## 2022-08-26 ENCOUNTER — Encounter (HOSPITAL_COMMUNITY)
Admission: EM | Disposition: A | Discharge status: Skilled Nursing Facility | Payer: Self-pay | Source: Home / Self Care | Attending: Internal Medicine

## 2022-08-26 ENCOUNTER — Encounter (HOSPITAL_COMMUNITY): Payer: Self-pay | Admitting: Internal Medicine

## 2022-08-26 ENCOUNTER — Inpatient Hospital Stay (HOSPITAL_COMMUNITY): Payer: Medicare Other | Admitting: Anesthesiology

## 2022-08-26 DIAGNOSIS — M869 Osteomyelitis, unspecified: Secondary | ICD-10-CM

## 2022-08-26 DIAGNOSIS — Z87891 Personal history of nicotine dependence: Secondary | ICD-10-CM

## 2022-08-26 DIAGNOSIS — Z794 Long term (current) use of insulin: Secondary | ICD-10-CM | POA: Diagnosis not present

## 2022-08-26 DIAGNOSIS — T148XXA Other injury of unspecified body region, initial encounter: Secondary | ICD-10-CM | POA: Diagnosis not present

## 2022-08-26 DIAGNOSIS — I2510 Atherosclerotic heart disease of native coronary artery without angina pectoris: Secondary | ICD-10-CM

## 2022-08-26 DIAGNOSIS — I10 Essential (primary) hypertension: Secondary | ICD-10-CM

## 2022-08-26 DIAGNOSIS — E1169 Type 2 diabetes mellitus with other specified complication: Secondary | ICD-10-CM | POA: Diagnosis not present

## 2022-08-26 DIAGNOSIS — M86672 Other chronic osteomyelitis, left ankle and foot: Secondary | ICD-10-CM | POA: Diagnosis not present

## 2022-08-26 HISTORY — PX: AMPUTATION: SHX166

## 2022-08-26 HISTORY — PX: APPLICATION OF WOUND VAC: SHX5189

## 2022-08-26 LAB — GLUCOSE, CAPILLARY
Glucose-Capillary: 86 mg/dL (ref 70–99)
Glucose-Capillary: 97 mg/dL (ref 70–99)
Glucose-Capillary: 104 mg/dL — ABNORMAL HIGH (ref 70–99)
Glucose-Capillary: 114 mg/dL — ABNORMAL HIGH (ref 70–99)
Glucose-Capillary: 195 mg/dL — ABNORMAL HIGH (ref 70–99)
Glucose-Capillary: 144 mg/dL — ABNORMAL HIGH (ref 70–99)

## 2022-08-26 SURGERY — AMPUTATION BELOW KNEE
Anesthesia: Monitor Anesthesia Care | Site: Leg Lower | Laterality: Left

## 2022-08-26 MED ORDER — OXYCODONE HCL 5 MG PO TABS
5.0000 mg | ORAL_TABLET | ORAL | Status: DC | PRN
  Administered 2022-08-27 (×2): 5 mg via ORAL
  Administered 2022-08-28: 10 mg via ORAL
  Administered 2022-08-30: 5 mg via ORAL
  Filled 2022-08-26 (×3): qty 2
  Filled 2022-08-26 (×2): qty 1

## 2022-08-26 MED ORDER — EPHEDRINE SULFATE-NACL 50-0.9 MG/10ML-% IV SOSY
PREFILLED_SYRINGE | INTRAVENOUS | Status: DC | PRN
  Administered 2022-08-26: 5 mg via INTRAVENOUS
  Administered 2022-08-26: 10 mg via INTRAVENOUS
  Administered 2022-08-26: 5 mg via INTRAVENOUS

## 2022-08-26 MED ORDER — MAGNESIUM SULFATE 2 GM/50ML IV SOLN: 2.0000 g | Freq: Every day | INTRAVENOUS | Status: DC | PRN

## 2022-08-26 MED ORDER — ONDANSETRON HCL 4 MG/2ML IJ SOLN
INTRAMUSCULAR | Status: AC
  Filled 2022-08-26: qty 2

## 2022-08-26 MED ORDER — POTASSIUM CHLORIDE CRYS ER 20 MEQ PO TBCR: 20.0000 meq | EXTENDED_RELEASE_TABLET | Freq: Every day | ORAL | Status: DC | PRN

## 2022-08-26 MED ORDER — CEFAZOLIN SODIUM-DEXTROSE 2-4 GM/100ML-% IV SOLN
2.0000 g | INTRAVENOUS | Status: AC
  Administered 2022-08-26: 2 g via INTRAVENOUS
  Filled 2022-08-26: qty 100

## 2022-08-26 MED ORDER — PANTOPRAZOLE SODIUM 40 MG PO TBEC
40.0000 mg | DELAYED_RELEASE_TABLET | Freq: Every day | ORAL | Status: DC
  Administered 2022-08-26 – 2022-08-31 (×6): 40 mg via ORAL
  Filled 2022-08-26 (×6): qty 1

## 2022-08-26 MED ORDER — JUVEN PO PACK
1.0000 | PACK | Freq: Two times a day (BID) | ORAL | Status: DC
  Administered 2022-08-26 – 2022-08-31 (×10): 1 via ORAL
  Filled 2022-08-26 (×9): qty 1

## 2022-08-26 MED ORDER — FENTANYL CITRATE (PF) 100 MCG/2ML IJ SOLN
INTRAMUSCULAR | Status: AC
  Administered 2022-08-26: 100 ug
  Filled 2022-08-26: qty 2

## 2022-08-26 MED ORDER — CEFAZOLIN SODIUM-DEXTROSE 2-4 GM/100ML-% IV SOLN: 2.0000 g | Freq: Three times a day (TID) | INTRAVENOUS | Status: DC

## 2022-08-26 MED ORDER — LACTATED RINGERS IV SOLN: INTRAVENOUS | Status: DC

## 2022-08-26 MED ORDER — ONDANSETRON HCL 4 MG/2ML IJ SOLN: 4.0000 mg | Freq: Once | INTRAMUSCULAR | Status: DC | PRN

## 2022-08-26 MED ORDER — PHENOL 1.4 % MT LIQD: 1.0000 | OROMUCOSAL | Status: DC | PRN

## 2022-08-26 MED ORDER — FENTANYL CITRATE (PF) 100 MCG/2ML IJ SOLN: 25.0000 ug | INTRAMUSCULAR | Status: DC | PRN

## 2022-08-26 MED ORDER — PROPOFOL 500 MG/50ML IV EMUL
INTRAVENOUS | Status: DC | PRN
  Administered 2022-08-26: 75 ug/kg/min via INTRAVENOUS

## 2022-08-26 MED ORDER — MAGNESIUM CITRATE PO SOLN: 1.0000 | Freq: Once | ORAL | Status: DC | PRN

## 2022-08-26 MED ORDER — PHENYLEPHRINE 80 MCG/ML (10ML) SYRINGE FOR IV PUSH (FOR BLOOD PRESSURE SUPPORT)
PREFILLED_SYRINGE | INTRAVENOUS | Status: DC | PRN
  Administered 2022-08-26: 80 ug via INTRAVENOUS

## 2022-08-26 MED ORDER — ACETAMINOPHEN 500 MG PO TABS
1000.0000 mg | ORAL_TABLET | Freq: Once | ORAL | Status: AC
  Administered 2022-08-26: 1000 mg via ORAL
  Filled 2022-08-26: qty 2

## 2022-08-26 MED ORDER — MIDAZOLAM HCL 2 MG/2ML IJ SOLN
INTRAMUSCULAR | Status: AC
  Filled 2022-08-26: qty 2

## 2022-08-26 MED ORDER — ALBUMIN HUMAN 5 % IV SOLN: INTRAVENOUS | Status: DC | PRN

## 2022-08-26 MED ORDER — ONDANSETRON HCL 4 MG/2ML IJ SOLN
INTRAMUSCULAR | Status: DC | PRN
  Administered 2022-08-26: 4 mg via INTRAVENOUS

## 2022-08-26 MED ORDER — ZINC SULFATE 220 (50 ZN) MG PO CAPS
220.0000 mg | ORAL_CAPSULE | Freq: Every day | ORAL | Status: DC
  Administered 2022-08-26 – 2022-08-31 (×6): 220 mg via ORAL
  Filled 2022-08-26 (×8): qty 1

## 2022-08-26 MED ORDER — ONDANSETRON HCL 4 MG/2ML IJ SOLN
4.0000 mg | Freq: Four times a day (QID) | INTRAMUSCULAR | Status: DC | PRN
  Administered 2022-08-27: 4 mg via INTRAVENOUS
  Filled 2022-08-26: qty 2

## 2022-08-26 MED ORDER — HYDRALAZINE HCL 20 MG/ML IJ SOLN: 5.0000 mg | INTRAMUSCULAR | Status: DC | PRN

## 2022-08-26 MED ORDER — ACETAMINOPHEN 325 MG PO TABS: 325.0000 mg | ORAL_TABLET | Freq: Four times a day (QID) | ORAL | Status: DC | PRN

## 2022-08-26 MED ORDER — TRANEXAMIC ACID-NACL 1000-0.7 MG/100ML-% IV SOLN
INTRAVENOUS | Status: AC
  Filled 2022-08-26: qty 100

## 2022-08-26 MED ORDER — TRANEXAMIC ACID-NACL 1000-0.7 MG/100ML-% IV SOLN
1000.0000 mg | INTRAVENOUS | Status: AC
  Administered 2022-08-26: 1000 mg via INTRAVENOUS

## 2022-08-26 MED ORDER — HYDROMORPHONE HCL 1 MG/ML IJ SOLN
0.5000 mg | INTRAMUSCULAR | Status: DC | PRN
  Administered 2022-08-27: 1 mg via INTRAVENOUS
  Administered 2022-08-27: 0.5 mg via INTRAVENOUS
  Administered 2022-08-27: 1 mg via INTRAVENOUS
  Administered 2022-08-27: 0.5 mg via INTRAVENOUS
  Filled 2022-08-26: qty 1
  Filled 2022-08-26: qty 0.5
  Filled 2022-08-26: qty 1
  Filled 2022-08-26: qty 0.5

## 2022-08-26 MED ORDER — FENTANYL CITRATE (PF) 250 MCG/5ML IJ SOLN
INTRAMUSCULAR | Status: AC
  Filled 2022-08-26: qty 5

## 2022-08-26 MED ORDER — DOCUSATE SODIUM 100 MG PO CAPS
100.0000 mg | ORAL_CAPSULE | Freq: Every day | ORAL | Status: DC
  Administered 2022-08-27 – 2022-08-30 (×4): 100 mg via ORAL
  Filled 2022-08-26 (×5): qty 1

## 2022-08-26 MED ORDER — 0.9 % SODIUM CHLORIDE (POUR BTL) OPTIME
TOPICAL | Status: DC | PRN
  Administered 2022-08-26: 1000 mL

## 2022-08-26 MED ORDER — GUAIFENESIN-DM 100-10 MG/5ML PO SYRP: 15.0000 mL | ORAL_SOLUTION | ORAL | Status: DC | PRN

## 2022-08-26 MED ORDER — METOPROLOL TARTRATE 5 MG/5ML IV SOLN: 2.0000 mg | INTRAVENOUS | Status: DC | PRN

## 2022-08-26 MED ORDER — SODIUM CHLORIDE 0.9 % IV SOLN: INTRAVENOUS | Status: DC

## 2022-08-26 MED ORDER — CHLORHEXIDINE GLUCONATE 0.12 % MT SOLN: 15.0000 mL | Freq: Once | OROMUCOSAL | Status: AC

## 2022-08-26 MED ORDER — POLYETHYLENE GLYCOL 3350 17 G PO PACK: 17.0000 g | PACK | Freq: Every day | ORAL | Status: DC | PRN

## 2022-08-26 MED ORDER — CHLORHEXIDINE GLUCONATE 0.12 % MT SOLN: 15.0000 mL | Freq: Once | OROMUCOSAL | Status: DC

## 2022-08-26 MED ORDER — OXYCODONE HCL 5 MG PO TABS
10.0000 mg | ORAL_TABLET | ORAL | Status: DC | PRN
  Administered 2022-08-27 – 2022-08-28 (×2): 10 mg via ORAL
  Administered 2022-08-28 – 2022-08-29 (×5): 15 mg via ORAL
  Filled 2022-08-26 (×2): qty 3
  Filled 2022-08-26: qty 2
  Filled 2022-08-26 (×3): qty 3

## 2022-08-26 MED ORDER — PROPOFOL 10 MG/ML IV BOLUS
INTRAVENOUS | Status: DC | PRN
  Administered 2022-08-26: 20 mg via INTRAVENOUS

## 2022-08-26 MED ORDER — ROPIVACAINE HCL 5 MG/ML IJ SOLN
INTRAMUSCULAR | Status: DC | PRN
  Administered 2022-08-26: 30 mL via PERINEURAL
  Administered 2022-08-26: 10 mL via PERINEURAL

## 2022-08-26 MED ORDER — LABETALOL HCL 5 MG/ML IV SOLN: 10.0000 mg | INTRAVENOUS | Status: DC | PRN

## 2022-08-26 MED ORDER — ROPIVACAINE HCL 5 MG/ML IJ SOLN: INTRAMUSCULAR | Status: DC | PRN

## 2022-08-26 MED ORDER — BISACODYL 5 MG PO TBEC: 5.0000 mg | DELAYED_RELEASE_TABLET | Freq: Every day | ORAL | Status: DC | PRN

## 2022-08-26 MED ORDER — ALUM & MAG HYDROXIDE-SIMETH 200-200-20 MG/5ML PO SUSP: 15.0000 mL | ORAL | Status: DC | PRN

## 2022-08-26 MED ORDER — POVIDONE-IODINE 10 % EX SWAB
2.0000 | Freq: Once | CUTANEOUS | Status: AC
  Administered 2022-08-26: 2 via TOPICAL

## 2022-08-26 MED ORDER — FENTANYL CITRATE PF 50 MCG/ML IJ SOSY
50.0000 ug | PREFILLED_SYRINGE | Freq: Once | INTRAMUSCULAR | Status: DC
  Filled 2022-08-26: qty 1

## 2022-08-26 MED ORDER — INSULIN ASPART 100 UNIT/ML IJ SOLN: 0.0000 [IU] | INTRAMUSCULAR | Status: DC | PRN

## 2022-08-26 MED ORDER — ORAL CARE MOUTH RINSE: 15.0000 mL | Freq: Once | OROMUCOSAL | Status: DC

## 2022-08-26 MED ORDER — CHLORHEXIDINE GLUCONATE 4 % EX LIQD: 60.0000 mL | Freq: Once | CUTANEOUS | Status: DC

## 2022-08-26 MED ORDER — VITAMIN C 500 MG PO TABS
1000.0000 mg | ORAL_TABLET | Freq: Every day | ORAL | Status: DC
  Administered 2022-08-26 – 2022-08-31 (×6): 1000 mg via ORAL
  Filled 2022-08-26 (×6): qty 2

## 2022-08-26 MED ORDER — ORAL CARE MOUTH RINSE
15.0000 mL | Freq: Once | OROMUCOSAL | Status: AC
Start: 2022-08-26 — End: 2022-08-26
  Administered 2022-08-26: 15 mL via OROMUCOSAL

## 2022-08-26 SURGICAL SUPPLY — 43 items
BAG COUNTER SPONGE SURGICOUNT (BAG) IMPLANT
BLADE SAW RECIP 87.9 MT (BLADE) ×2 IMPLANT
BLADE SURG 21 STRL SS (BLADE) ×2 IMPLANT
BNDG COHESIVE 6X5 TAN STRL LF (GAUZE/BANDAGES/DRESSINGS) IMPLANT
CANISTER WOUND CARE 500ML ATS (WOUND CARE) ×2 IMPLANT
COVER SURGICAL LIGHT HANDLE (MISCELLANEOUS) ×2 IMPLANT
CUFF TOURN SGL QUICK 34 (TOURNIQUET CUFF) ×2
CUFF TRNQT CYL 34X4.125X (TOURNIQUET CUFF) ×2 IMPLANT
DRAPE DERMATAC (DRAPES) IMPLANT
DRAPE INCISE 23X17 IOBAN STRL (DRAPES) ×2
DRAPE INCISE 23X17 STRL (DRAPES) IMPLANT
DRAPE INCISE IOBAN 23X17 STRL (DRAPES) ×2 IMPLANT
DRAPE INCISE IOBAN 66X45 STRL (DRAPES) ×2 IMPLANT
DRAPE U-SHAPE 47X51 STRL (DRAPES) ×2 IMPLANT
DRESSING PREVENA PLUS CUSTOM (GAUZE/BANDAGES/DRESSINGS) ×2 IMPLANT
DRSG PREVENA PLUS CUSTOM (GAUZE/BANDAGES/DRESSINGS) ×4
DURAPREP 26ML APPLICATOR (WOUND CARE) ×2 IMPLANT
ELECT REM PT RETURN 9FT ADLT (ELECTROSURGICAL) ×2
ELECTRODE REM PT RTRN 9FT ADLT (ELECTROSURGICAL) ×2 IMPLANT
GLOVE BIOGEL PI IND STRL 9 (GLOVE) ×2 IMPLANT
GLOVE SURG ORTHO 9.0 STRL STRW (GLOVE) ×2 IMPLANT
GOWN STRL REUS W/ TWL XL LVL3 (GOWN DISPOSABLE) ×4 IMPLANT
GOWN STRL REUS W/TWL XL LVL3 (GOWN DISPOSABLE) ×4
GRAFT SKIN WND MICRO 38 (Tissue) IMPLANT
GRAFT SKIN WND OMEGA3 SB 7X10 (Tissue) IMPLANT
KIT BASIN OR (CUSTOM PROCEDURE TRAY) ×2 IMPLANT
KIT TURNOVER KIT B (KITS) ×2 IMPLANT
MANIFOLD NEPTUNE II (INSTRUMENTS) ×2 IMPLANT
NS IRRIG 1000ML POUR BTL (IV SOLUTION) ×2 IMPLANT
PACK ORTHO EXTREMITY (CUSTOM PROCEDURE TRAY) ×2 IMPLANT
PAD ARMBOARD 7.5X6 YLW CONV (MISCELLANEOUS) ×2 IMPLANT
PREVENA RESTOR ARTHOFORM 33X30 (CANNISTER) IMPLANT
PREVENA RESTOR ARTHOFORM 46X30 (CANNISTER) ×2 IMPLANT
SPONGE T-LAP 18X18 ~~LOC~~+RFID (SPONGE) IMPLANT
STAPLER VISISTAT 35W (STAPLE) IMPLANT
STOCKINETTE IMPERVIOUS LG (DRAPES) ×2 IMPLANT
SUT ETHILON 2 0 PSLX (SUTURE) IMPLANT
SUT SILK 2 0 (SUTURE) ×2
SUT SILK 2-0 18XBRD TIE 12 (SUTURE) ×2 IMPLANT
SUT VIC AB 1 CTX 27 (SUTURE) ×4 IMPLANT
TOWEL GREEN STERILE (TOWEL DISPOSABLE) ×2 IMPLANT
TUBE CONNECTING 12X1/4 (SUCTIONS) ×2 IMPLANT
YANKAUER SUCT BULB TIP NO VENT (SUCTIONS) ×2 IMPLANT

## 2022-08-26 NOTE — Anesthesia Preprocedure Evaluation (Addendum)
Anesthesia Evaluation  Patient identified by MRN, date of birth, ID band Patient awake    Reviewed: Allergy & Precautions, NPO status , Patient's Chart, lab work & pertinent test results, reviewed documented beta blocker date and time   Airway Mallampati: II  TM Distance: >3 FB Neck ROM: Full    Dental  (+) Dental Advisory Given, Poor Dentition, Chipped, Missing   Pulmonary former smoker,    Pulmonary exam normal breath sounds clear to auscultation       Cardiovascular hypertension, Pt. on home beta blockers and Pt. on medications + CAD and + Peripheral Vascular Disease  Normal cardiovascular exam Rhythm:Regular Rate:Normal     Neuro/Psych PSYCHIATRIC DISORDERS Anxiety Depression negative neurological ROS     GI/Hepatic negative GI ROS, (+) Cirrhosis       ,   Endo/Other  diabetes, Type 2  Renal/GU Renal InsufficiencyRenal disease     Musculoskeletal  (+) Arthritis , Osteomyelitis Left Foot   Abdominal   Peds  Hematology  (+) Blood dyscrasia, anemia ,   Anesthesia Other Findings Day of surgery medications reviewed with the patient.  Reproductive/Obstetrics                            Anesthesia Physical Anesthesia Plan  ASA: 3  Anesthesia Plan: MAC and Regional   Post-op Pain Management: Tylenol PO (pre-op)* and Regional block*   Induction: Intravenous  PONV Risk Score and Plan: 2 and Dexamethasone, Ondansetron and TIVA  Airway Management Planned: Natural Airway and Simple Face Mask  Additional Equipment:   Intra-op Plan:   Post-operative Plan:   Informed Consent: I have reviewed the patients History and Physical, chart, labs and discussed the procedure including the risks, benefits and alternatives for the proposed anesthesia with the patient or authorized representative who has indicated his/her understanding and acceptance.     Dental advisory given  Plan Discussed  with: CRNA  Anesthesia Plan Comments:        Anesthesia Quick Evaluation                                  Anesthesia Evaluation  Patient identified by MRN, date of birth, ID band Patient awake    Reviewed: Allergy & Precautions, NPO status , Patient's Chart, lab work & pertinent test results, reviewed documented beta blocker date and time   Airway Mallampati: II  TM Distance: >3 FB Neck ROM: Full    Dental no notable dental hx. (+) Missing, Chipped,    Pulmonary neg pulmonary ROS, former smoker,    Pulmonary exam normal breath sounds clear to auscultation       Cardiovascular hypertension, Pt. on home beta blockers and Pt. on medications + CAD and + Peripheral Vascular Disease  Normal cardiovascular exam Rhythm:Regular Rate:Normal  TTE 2022 Normal EF, mild MR   Neuro/Psych PSYCHIATRIC DISORDERS Anxiety Depression negative neurological ROS     GI/Hepatic negative GI ROS, (+) Cirrhosis  (NASH cirrhosis)      ,   Endo/Other  diabetes, Type 2, Insulin Dependent  Renal/GU Renal InsufficiencyRenal diseaseLab Results      Component                Value               Date  CREATININE               0.96                06/03/2022                BUN                      24 (H)              06/03/2022                NA                       136                 06/03/2022                K                        3.7                 06/03/2022                CL                       108                 06/03/2022                CO2                      20 (L)              06/03/2022             negative genitourinary   Musculoskeletal  (+) Arthritis ,   Abdominal   Peds  Hematology  (+) Blood dyscrasia, anemia , Lab Results      Component                Value               Date                      WBC                      2.3 (L)             06/03/2022                HGB                      7.9 (L)             06/03/2022                 HCT                      25.5 (L)            06/03/2022                MCV                      104.5 (H)           06/03/2022  PLT                      83 (L)              06/03/2022              Anesthesia Other Findings   Reproductive/Obstetrics                            Anesthesia Physical Anesthesia Plan  ASA: 3  Anesthesia Plan:    Post-op Pain Management: Ofirmev IV (intra-op)*   Induction:   PONV Risk Score and Plan: 2 and Midazolam and Ondansetron  Airway Management Planned:   Additional Equipment:   Intra-op Plan:   Post-operative Plan:   Informed Consent: I have reviewed the patients History and Physical, chart, labs and discussed the procedure including the risks, benefits and alternatives for the proposed anesthesia with the patient or authorized representative who has indicated his/her understanding and acceptance.     Dental advisory given  Plan Discussed with: CRNA and Anesthesiologist  Anesthesia Plan Comments:         Anesthesia Quick Evaluation

## 2022-08-26 NOTE — Transfer of Care (Signed)
Immediate Anesthesia Transfer of Care Note  Patient: Ariel Arnold  Procedure(s) Performed: LEFT BELOW KNEE AMPUTATION (Left: Knee) APPLICATION OF WOUND VAC (Left: Leg Lower)  Patient Location: PACU  Anesthesia Type:MAC  Level of Consciousness: drowsy, patient cooperative and responds to stimulation  Airway & Oxygen Therapy: Patient Spontanous Breathing and Patient connected to face mask oxygen  Post-op Assessment: Report given to RN and Post -op Vital signs reviewed and stable  Post vital signs: Reviewed and stable  Last Vitals:  Vitals Value Taken Time  BP 109/50 08/26/22 1149  Temp    Pulse 62 08/26/22 1151  Resp 10 08/26/22 1151  SpO2 100 % 08/26/22 1151  Vitals shown include unvalidated device data.  Last Pain:  Vitals:   08/26/22 0957  TempSrc: Oral  PainSc:       Patients Stated Pain Goal: 1 (20/94/70 9628)  Complications: No notable events documented.

## 2022-08-26 NOTE — Op Note (Signed)
08/26/2022  12:09 PM  PATIENT:  Ariel Arnold    PRE-OPERATIVE DIAGNOSIS:  Osteomyelitis Left Foot  POST-OPERATIVE DIAGNOSIS:  Same  PROCEDURE:  LEFT BELOW KNEE AMPUTATION, APPLICATION OF WOUND VAC Application of Kerecis tissue graft.  7 x 10 cm sheet and 38 cm micro powder to cover a surface area of 200 cm.  SURGEON:  Newt Minion, MD  ANESTHESIA:   General  PREOPERATIVE INDICATIONS:  Ariel Arnold is a  66 y.o. female with a diagnosis of Osteomyelitis Left Foot who failed conservative measures and elected for surgical management.    The risks benefits and alternatives were discussed with the patient preoperatively including but not limited to the risks of infection, bleeding, nerve injury, cardiopulmonary complications, the need for revision surgery, among others, and the patient was willing to proceed.  OPERATIVE IMPLANTS: Kerecis sheet 7 x 10 cm and Kerecis micro powder 38 cm   OPERATIVE FINDINGS: Wound margins had healthy viable tissue.  OPERATIVE PROCEDURE: Patient was brought to the operating room after undergoing a regional anesthetic.  After adequate levels anesthesia were obtained a thigh tourniquet was placed and the lower extremity was prepped using DuraPrep draped into a sterile field. The foot was draped out of the sterile field with impervious stockinette.  A timeout was called and the tourniquet inflated.  A transverse skin incision was made 12 cm distal to the tibial tubercle, the incision curved proximally, and a large posterior flap was created.  The tibia was transected just proximal to the skin incision and beveled anteriorly.  The fibula was transected just proximal to the tibial incision.  The sciatic nerve was pulled cut and allowed to retract.  The vascular bundles were suture ligated with 2-0 silk.  The tourniquet was deflated and hemostasis obtained.  The total wound surface area was 200 cm.  The Kerecis micro powder was applied 38 cm and this was  covered with the Kerecis 7 x 10 cm sheet.  The flap edges were then reapproximated.    The deep and superficial fascial layers were closed using #1 Vicryl.  The skin was closed using staples.  The Prevena customizable dressing was applied this was overwrapped with the arthroform sponge.  [name] was used to secure the sponges and the circumferential compression was secured to the skin with Dermatac.  This was connected to the wound VAC pump and had a good suction fit this was covered with a stump shrinker and a limb protector.  Patient was taken to the PACU in stable condition.   DISCHARGE PLANNING:  Antibiotic duration: 24-hour antibiotics  Weightbearing: Nonweightbearing on the operative extremity  Pain medication: Opioid pathway  Dressing care/ Wound VAC: Continue wound VAC with the Prevena plus pump at discharge for 1 week  Ambulatory devices: Walker or kneeling scooter  Discharge to: Discharge planning based on recommendations per physical therapy  Follow-up: In the office 1 week after discharge.

## 2022-08-26 NOTE — Care Management Important Message (Signed)
Important Message  Patient Details  Name: Ariel Arnold MRN: [id number] Date of Birth: [birth year]   Medicare Important Message Given:  Yes     Orbie Pyo 08/26/2022, 4:17 PM

## 2022-08-26 NOTE — Progress Notes (Signed)
Patient is noted to be allergic with chlorhexedine per documentation, inquired with Hedy Camara Pharmacist regarding pending order of hibiclens pre-operatively if this can be administered given CHG is listed as allergy but  CHG pads have been applied and used to patient for 5 days, per pharmacy recommendation, to wait until dayshift to clarify with surgeon regarding skin preparation with CHG use.

## 2022-08-26 NOTE — TOC Progression Note (Signed)
Transition of Care [facility]) - Progression Note    Patient Details  Name: Ariel Arnold MRN: [id number] Date of Birth: May 02, 1956  Transition of Care [facility]) CM/SW [place], LCSW Phone Number: 08/26/2022, 9:35 AM  Clinical Narrative:    CSW following for discharge needs after surgery today. Of note, patient recently has been in and out of [facility] SNF rehab (approved by [facility] and has [place] Medicaid secondary). Will review therapy recommendations as needed.    Expected Discharge Plan: [place] Barriers to Discharge: Continued Medical Work up  Expected Discharge Plan and Services Expected Discharge Plan: [place] arrangements for the past 2 months: Single Family Home                                       Social Determinants of Health (SDOH) Interventions    Readmission Risk Interventions    11/16/2021    9:27 AM  Readmission Risk Prevention Plan  Home Care Screening Complete  Medication Review (RN CM) Complete

## 2022-08-26 NOTE — Consult Note (Signed)
ORTHOPAEDIC CONSULTATION  REQUESTING PHYSICIAN: Elgergawy, Silver Huguenin, MD  Chief Complaint: Chronic osteomyelitis with ulceration transmetatarsal amputation on the left.  HPI: Ariel Arnold is a 66 y.o. female who presents with chronic ulceration with multiple attempts for foot salvage intervention on the left with persistent ulceration and osteomyelitis of a transmetatarsal amputation.  Past Medical History:  Diagnosis Date   Anemia    Anxiety    Aortic atherosclerosis (HCC)    Arthritis    AVM (arteriovenous malformation) of colon    Depression    Essential hypertension    GI bleeding    Recurrent   Hepatic encephalopathy ([place])    History of kidney stones    History of RSV infection    Liver cirrhosis secondary to NASH ([place]) 2004   Biopsy-proven   Obesity    Osteomyelitis ([place])    Peripheral vascular disease (HCC)    Thrombocytopenia ([place])    Type 2 diabetes mellitus ([place])    Past Surgical History:  Procedure Laterality Date   ABDOMINAL HYSTERECTOMY     AMPUTATION Left 04/05/2022   Procedure: Metatarsal amputation revision;  Surgeon: Lorenda Peck, MD;  Location: [facility];  Service: Podiatry;  Laterality: Left;  Surgical team to do block   COLONOSCOPY     Cystoscopy with ureteral stent     ESOPHAGOGASTRODUODENOSCOPY     ESOPHAGOGASTRODUODENOSCOPY (EGD) WITH PROPOFOL N/A 05/20/2021   Procedure: ESOPHAGOGASTRODUODENOSCOPY (EGD) WITH PROPOFOL;  Surgeon: Daneil Dolin, MD;  [facility];  Service: Endoscopy;  Laterality: N/A;  needs intubation   IR ANGIOGRAM FOLLOW UP STUDY  05/21/2021   IR ANGIOGRAM SELECTIVE EACH ADDITIONAL VESSEL  05/21/2021   IR EMBO VENOUS NOT HEMORR HEMANG  INC GUIDE ROADMAPPING  05/21/2021   IR US GUIDE VASC ACCESS RIGHT  05/21/2021   IR VENOGRAM RENAL UNI LEFT  05/21/2021   IRRIGATION AND DEBRIDEMENT FOOT Left 06/04/2022   Procedure: REVISIONAL TRAMSMETATARSAL AMPUTATION;  Surgeon: Felipa Furnace, DPM;  Location: [facility];  Service: Podiatry;   Laterality: Left;   RADIOLOGY WITH ANESTHESIA N/A 05/21/2021   Procedure: IR WITH ANESTHESIA;  Surgeon: Radiologist, Medication, MD;  Location: [place];  Service: Radiology;  Laterality: N/A;   Toe amputations Bilateral    TUBAL LIGATION     WOUND DEBRIDEMENT Left 11/12/2021   Procedure: DEBRIDEMENT WOUND;FIRST METATARSAL RESECTION; APPLICATION OF SKIN GRAFT SUBSTITUTE;  Surgeon: Evelina Bucy, DPM;  Location: [facility];  Service: Podiatry;  Laterality: Left;   Social History   Socioeconomic History   Marital status: Widowed    Spouse name: Not on file   Number of children: Not on file   Years of education: Not on file   Highest education level: Not on file  Occupational History   Not on file  Tobacco Use   Smoking status: Former    Types: Cigarettes   Smokeless tobacco: Never  Vaping Use   Vaping Use: Never used  Substance and Sexual Activity   Alcohol use: Yes    Comment: wine rare   Drug use: Never   Sexual activity: Not on file  Other Topics Concern   Not on file  Social History Narrative   Not on file   Social Determinants of Health   Financial Resource Strain: Not on file  Food Insecurity: Not on file  Transportation Needs: Not on file  Physical Activity: Not on file  Stress: Not on file  Social Connections: Not on file   Family History  Problem Relation  Age of Onset   Heart failure Mother    Hyperlipidemia Mother    Diabetes Mellitus II Mother    Heart failure Father    Hyperlipidemia Father    - negative except otherwise stated in the family history section Allergies  Allergen Reactions   Chlorhexidine Rash   Tape Rash   Prior to Admission medications   Medication Sig Start Date End Date Taking? Authorizing Provider  albuterol (VENTOLIN HFA) 108 (90 Base) MCG/ACT inhaler Inhale 2 puffs into the lungs every 4 (four) hours as needed for shortness of breath or wheezing. 01/11/21  Yes [provider]  Calcium Carbonate-Vit D-Min (CALCIUM 1200)  1200-1000 MG-UNIT CHEW Chew 2 tablets by mouth daily.   Yes [provider]  carvedilol (COREG) 6.25 MG tablet Take 1 tablet (6.25 mg total) by mouth 2 (two) times daily. 04/11/22 08/21/23 Yes [name], MD  clotrimazole-betamethasone (LOTRISONE) cream Apply 1 Application topically daily as needed (for yeast/rash). 11/25/21  Yes [provider]  diclofenac Sodium (VOLTAREN) 1 % GEL Apply 2 g topically in the morning and at bedtime. 12/02/21  Yes [provider]  ferrous sulfate 325 (65 FE) MG tablet Take 325 mg by mouth in the morning and at bedtime.   Yes [provider]  folic acid (FOLVITE) 1 MG tablet Take 1 mg by mouth 2 (two) times daily. 06/02/21  Yes [provider]  furosemide (LASIX) 20 MG tablet Take 20 mg by mouth once a week. 05/27/21  Yes [provider]  HUMALOG KWIKPEN 100 UNIT/ML KwikPen Inject 8 Units into the skin in the morning, at noon, in the evening, and at bedtime. Per sliding scale 12/09/21  Yes [provider]  hydrOXYzine (ATARAX) 25 MG tablet Take 25 mg by mouth daily. 08/17/22  Yes [provider]  ketorolac (ACULAR) 0.5 % ophthalmic solution Place 1 drop into both eyes 4 (four) times daily as needed (dry eyes). 09/06/21  Yes [provider]  lactulose (CHRONULAC) 10 GM/15ML solution Take 30 mLs by mouth 2 (two) times daily. for ammonia levels 08/10/22  Yes [provider]  Oxycodone HCl 10 MG TABS Take 5 mg by mouth daily as needed (for severe pain). 07/07/22  Yes [provider]  pantoprazole (PROTONIX) 40 MG tablet Take 40 mg by mouth daily as needed (heartburn). 05/27/21  Yes [provider]  pravastatin (PRAVACHOL) 20 MG tablet Take 1 tablet (20 mg total) by mouth at bedtime. Take after completion of daptomycin 01/03/22  Yes [name], Laxman, MD  sertraline (ZOLOFT) 100 MG tablet Take 2 tablets (200 mg total) by mouth daily. Patient taking differently: Take 150 mg by  mouth daily. 06/08/22 08/21/23 Yes [name], MD  spironolactone (ALDACTONE) 50 MG tablet Take 50 mg by mouth daily as needed (swelling). 05/27/21  Yes [provider]  TOUJEO MAX SOLOSTAR 300 UNIT/ML Solostar Pen Inject 60 Units into the skin at bedtime. 06/03/21  Yes [provider]  vitamin C (ASCORBIC ACID) 500 MG tablet Take 500 mg by mouth 2 (two) times daily.   Yes [provider]  Vitamin D, Ergocalciferol, (DRISDOL) 1.25 MG (50000 UNIT) CAPS capsule Take 1 capsule by mouth every 7 (seven) days. Mondays 04/05/21  Yes [provider]  ACCU-CHEK GUIDE test strip  08/27/21   [provider]  Accu-Chek Softclix Lancets lancets SMARTSIG:2 Topical Twice Daily 08/27/21   [provider]  Blood Glucose Monitoring Suppl (ACCU-CHEK GUIDE) w/Device KIT  08/27/21   [provider]  gabapentin (NEURONTIN) 600 MG tablet Take 600 mg by mouth 3 (three) times daily as needed (nerve pain). 07/23/21   [provider]  GLOBAL EASE INJECT PEN NEEDLES 32G X 4 MM MISC  08/27/21   [provider]   No results found. - pertinent xrays, CT, MRI studies were reviewed and independently interpreted  Positive ROS: All other systems have been reviewed and were otherwise negative with the exception of those mentioned in the HPI and as above.  Physical Exam: General: Alert, no acute distress Psychiatric: Patient is competent for consent with normal mood and affect Lymphatic: No axillary or cervical lymphadenopathy Cardiovascular: No pedal edema Respiratory: No cyanosis, no use of accessory musculature GI: No organomegaly, abdomen is soft and non-tender    Images:  _0 @  Labs:  Lab Results  Component Value Date   HGBA1C 11.2 (H) 04/03/2022   HGBA1C 9.4 (H) 11/10/2021   ESRSEDRATE 11 02/07/2022   ESRSEDRATE 21 11/09/2021   CRP 6.0 (H) 08/19/2022   CRP 4.7 02/07/2022   CRP 0.9 11/09/2021   REPTSTATUS 08/25/2022 FINAL 08/20/2022    REPTSTATUS 08/25/2022 FINAL 08/20/2022   GRAMSTAIN  06/04/2022    NO SQUAMOUS EPITHELIAL CELLS SEEN RARE WBC SEEN NO ORGANISMS SEEN    CULT  08/20/2022    NO GROWTH 5 DAYS Performed at [facility], [place] [address], [place], [place] [address]    CULT  08/20/2022    NO GROWTH 5 DAYS Performed at [facility] [address] [place], [place] [address]    LABORGA METHICILLIN RESISTANT STAPHYLOCOCCUS AUREUS 06/04/2022    Lab Results  Component Value Date   ALBUMIN 2.0 (L) 08/22/2022   ALBUMIN 2.4 (L) 08/20/2022   ALBUMIN 2.8 (L) 08/19/2022        Latest Ref Rng & Units 08/24/2022    4:41 AM 08/22/2022    4:02 AM 08/20/2022    2:29 AM  CBC EXTENDED  WBC 4.0 - 10.5 K/uL 4.1  3.0  2.7   RBC 3.87 - 5.11 MIL/uL 3.04  2.58  2.70    2.70   Hemoglobin 12.0 - 15.0 g/dL 9.6  8.2  8.7   HCT 36.0 - 46.0 % 29.7  25.2  26.4   Platelets 150 - 400 K/uL 90  88  91   NEUT# 1.7 - 7.7 K/uL   1.6   Lymph# 0.7 - 4.0 K/uL   0.7     Neurologic: Patient does not have protective sensation bilateral lower extremities.   MUSCULOSKELETAL:   Skin: Examination patient has a chronic wound over the residual limb left foot.  There is drainage there is no ascending cellulitis.  Patient has a palpable dorsalis pedis pulse.  Albumin 2.0 hemoglobin 9.6.  Most recent hemoglobin A1c 11.2.  Most recent cultures positive for MRSA.  Review of the MRI scan shows extensive osteomyelitis involving the residual bones left foot.  Assessment: Assessment: Uncontrolled type 2 diabetes with severe protein caloric malnutrition with osteomyelitis left midfoot status post foot salvage intervention with persistent ulceration and drainage.  Plan: Plan for a left transtibial amputation.  Risk and benefits were discussed including persistent infection nonhealing the wound need for additional surgery.  Patient states she understands wished to proceed at this time.  Thank you for the consult and the opportunity to  see Ms. Mariea Stable, [place] [phone] 8:39 AM

## 2022-08-26 NOTE — Progress Notes (Signed)
Orthopedic Tech Progress Note Patient Details:  [name] 08-13-1956 [id number]  Called in [facility] into [facility].   Patient ID: Ariel Arnold, female   DOB: [birth year], 66 y.o.   MRN: [id number]  Arville Go 08/26/2022, 11:40 AM

## 2022-08-26 NOTE — Anesthesia Procedure Notes (Signed)
Anesthesia Regional Block: Popliteal block   Pre-Anesthetic Checklist: , timeout performed,  Correct Patient, Correct Site, Correct Laterality,  Correct Procedure, Correct Position, site marked,  Risks and benefits discussed,  Surgical consent,  Pre-op evaluation,  At surgeon's request and post-op pain management  Laterality: Left  Prep: chloraprep       Needles:  Injection technique: Single-shot  Needle Type: Echogenic Needle     Needle Length: 9cm  Needle Gauge: 21     Additional Needles:   Procedures:,,,, ultrasound used (permanent image in chart),,    Narrative:  Start time: 08/26/2022 10:15 AM End time: 08/26/2022 10:20 AM Injection made incrementally with aspirations every 5 mL.  Performed by: Personally  Anesthesiologist: [name], MD  Additional Notes: No pain on injection. No increased resistance to injection. Injection made in 5cc increments.  Good needle visualization.  Patient tolerated procedure well.

## 2022-08-26 NOTE — Plan of Care (Signed)
  Problem: Skin Integrity: Goal: Risk for impaired skin integrity will decrease Outcome: Not Progressing   Problem: Tissue Perfusion: Goal: Adequacy of tissue perfusion will improve Outcome: Not Progressing   Problem: Activity: Goal: Risk for activity intolerance will decrease Outcome: Not Progressing   Problem: Safety: Goal: Ability to remain free from injury will improve Outcome: Not Progressing   Problem: Pain Managment: Goal: General experience of comfort will improve Outcome: Not Progressing

## 2022-08-26 NOTE — Progress Notes (Signed)
PROGRESS NOTE    Ariel Arnold  JFH:[id number] DOB: 1956-12-09 DOA: 08/19/2022 PCP: Jacqualine Code, DO     Brief Narrative:   Ariel Arnold is a 66 year old female with DM2, HTN, NASH cirrhosis with prior hepatic encephalopathy, HTN, previous MRSA foot wound infection, status post transmetatarsal amputation in April 2023 due to osteomyelitis, came into the hospital for worsening left foot pain, redness, and discharge at home.  X-rays in the ED showed concern for acute osteomyelitis. Podiatry and orthopedic surgery consulted, recommended evaluation by Dr. Sharol Given for BKA.  Dr. Sharol Given planning for surgical intervention 9/8  Subjective: No significant events overnight , pain is controlled.  Assessment & Plan:   Principal Problem:   Chronic osteomyelitis involving ankle and foot, left (HCC) Active Problems:   Liver cirrhosis secondary to NASH (HCC)   Thrombocytopenia (HCC)   Coronary atherosclerosis   Generalized anxiety disorder   Mixed hyperlipidemia   Type 2 diabetes mellitus treated with insulin (HCC)   Essential hypertension   Lactic acidosis   Diabetic osteomyelitis (HCC)   Left lower extremity osteomyelitis -Vancomycin, cefepime -ID following -Seen by orthopedic, Dr. Sharol Given, plan for left transtibial amputation   NASH cirrhosis with pancytopenia -Stable  Hypertension -Coreg  Chronic diastolic heart failure -Without exacerbation  Hyperlipidemia -Pravachol  Diabetes mellitus type 2 -Semglee, sliding scale insulin  DVT prophylaxis:  SCD's Start: 08/20/22 1709 enoxaparin (LOVENOX) injection 40 mg Start: 08/20/22 1000  Code Status: Full code Family Communication: No family at bedside Disposition Plan:  Status is: Inpatient Remains inpatient appropriate because: OR today  Consultants:  Podiatry ID Orthopedic surgery   Antimicrobials:  Anti-infectives (From admission, onward)    Start     Dose/Rate Route Frequency Ordered Stop   08/26/22 0745   ceFAZolin (ANCEF) IVPB 2g/100 mL premix        2 g 200 mL/hr over 30 Minutes Intravenous On call to O.R. 08/26/22 0656 08/27/22 0559   08/21/22 0100  vancomycin (VANCOREADY) IVPB 1500 mg/300 mL        1,500 mg 150 mL/hr over 120 Minutes Intravenous Every 24 hours 08/20/22 0050     08/20/22 0800  ceFEPIme (MAXIPIME) 2 g in sodium chloride 0.9 % 100 mL IVPB  Status:  Discontinued        2 g 200 mL/hr over 30 Minutes Intravenous Every 8 hours 08/20/22 0033 08/24/22 1205   08/20/22 0015  vancomycin (VANCOREADY) IVPB 1500 mg/300 mL        1,500 mg 150 mL/hr over 120 Minutes Intravenous  Once 08/20/22 0012 08/20/22 0300   08/19/22 2345  vancomycin (VANCOCIN) IVPB 1000 mg/200 mL premix  Status:  Discontinued        1,000 mg 200 mL/hr over 60 Minutes Intravenous  Once 08/19/22 2334 08/20/22 0012   08/19/22 2345  ceFEPIme (MAXIPIME) 2 g in sodium chloride 0.9 % 100 mL IVPB        2 g 200 mL/hr over 30 Minutes Intravenous  Once 08/19/22 2334 08/20/22 0030        Objective: Vitals:   08/25/22 1954 08/25/22 2309 08/26/22 0312 08/26/22 0800  BP: (!) 120/54 (!) 109/50 (!) 118/55 (!) 113/47  Pulse: 78 72 67 64  Resp: 19 18 10 10   Temp: 97.8 F (36.6 C) 97.8 F (36.6 C) 98 F (36.7 C) 97.8 F (36.6 C)  TempSrc: Oral Oral Oral Oral  SpO2: 98% 98% 98% 97%  Weight:      Height:  Intake/Output Summary (Last 24 hours) at 08/26/2022 0920 Last data filed at 08/26/2022 0313 Gross per 24 hour  Intake 358 ml  Output 600 ml  Net -242 ml   Filed Weights   08/19/22 2103 08/21/22 0550  Weight: 79.8 kg 86.7 kg    Examination:   Awake Alert, Oriented X 3, frail Symmetrical Chest wall movement, Good air movement bilaterally, CTAB RRR,No Gallops,Rubs or new Murmurs, No Parasternal Heave +ve B.Sounds, Abd Soft, No tenderness, No rebound - guarding or rigidity. Foot status post TMA with wound dehiscence  Data Reviewed: I have personally reviewed following labs and imaging  studies  CBC: Recent Labs  Lab 08/19/22 2328 08/20/22 0229 08/22/22 0402 08/24/22 0441  WBC  --  2.7* 3.0* 4.1  NEUTROABS  --  1.6*  --   --   HGB 9.5* 8.7* 8.2* 9.6*  HCT 28.0* 26.4* 25.2* 29.7*  MCV  --  97.8 97.7 97.7  PLT  --  91* 88* 90*   Basic Metabolic Panel: Recent Labs  Lab 08/19/22 2250 08/19/22 2328 08/20/22 0227 08/22/22 0402 08/24/22 0441  NA 137 140 139 136 136  K 4.7 4.5 3.4* 3.9 3.8  CL 106 103 109 111 108  CO2 26  --  25 21* 25  GLUCOSE 173* 173* 324* 143* 199*  BUN 24* 28* 22 21 22   CREATININE 0.82 0.70 0.93 0.80 0.76  CALCIUM 8.3*  --  8.1* 7.8* 8.5*  MG  --   --   --  1.7 1.8  PHOS  --   --   --  2.6  --    GFR: Estimated Creatinine Clearance: 76.1 mL/min (by C-G formula based on SCr of 0.76 mg/dL). Liver Function Tests: Recent Labs  Lab 08/19/22 2250 08/20/22 0227 08/22/22 0402  AST 37 20 23  ALT 19 16 14   ALKPHOS 132* 104 96  BILITOT 1.6* 1.2 0.9  PROT 7.8 6.5 5.8*  ALBUMIN 2.8* 2.4* 2.0*   No results for input(s): "LIPASE", "AMYLASE" in the last 168 hours. No results for input(s): "AMMONIA" in the last 168 hours. Coagulation Profile: No results for input(s): "INR", "PROTIME" in the last 168 hours. Cardiac Enzymes: No results for input(s): "CKTOTAL", "CKMB", "CKMBINDEX", "TROPONINI" in the last 168 hours. BNP (last 3 results) No results for input(s): "PROBNP" in the last 8760 hours. HbA1C: No results for input(s): "HGBA1C" in the last 72 hours. CBG: Recent Labs  Lab 08/25/22 0736 08/25/22 1113 08/25/22 1530 08/25/22 2050 08/26/22 0749  GLUCAP 196* 106* 245* 191* 86   Lipid Profile: No results for input(s): "CHOL", "HDL", "LDLCALC", "TRIG", "CHOLHDL", "LDLDIRECT" in the last 72 hours. Thyroid Function Tests: No results for input(s): "TSH", "T4TOTAL", "FREET4", "T3FREE", "THYROIDAB" in the last 72 hours. Anemia Panel: No results for input(s): "VITAMINB12", "FOLATE", "FERRITIN", "TIBC", "IRON", "RETICCTPCT" in the last 72  hours. Sepsis Labs: Recent Labs  Lab 08/19/22 2250 08/20/22 0227  LATICACIDVEN 2.2* 1.8    Recent Results (from the past 240 hour(s))  Culture, blood (Routine X 2) w Reflex to ID Panel     Status: None   Collection Time: 08/20/22 11:47 AM   Specimen: BLOOD  Result Value Ref Range Status   Specimen Description   Final    BLOOD LEFT ANTECUBITAL Performed at [facility], [place] [address], [place], [place] [address]    Special Requests   Final    BOTTLES DRAWN AEROBIC AND ANAEROBIC Blood Culture results may not be optimal due to an inadequate volume  of blood received in culture bottles Performed at [facility], [place] [address] [place], [place] [address]    Culture   Final    NO GROWTH 5 DAYS Performed at [facility], [place] [address], [place], [place] [address]    Report Status 08/25/2022 FINAL  Final  Culture, blood (Routine X 2) w Reflex to ID Panel     Status: None   Collection Time: 08/20/22 11:47 AM   Specimen: BLOOD  Result Value Ref Range Status   Specimen Description   Final    BLOOD LEFT ANTECUBITAL Performed at [place] [address], [place], [place] [address]    Special Requests   Final    BOTTLES DRAWN AEROBIC AND ANAEROBIC Blood Culture results may not be optimal due to an inadequate volume of blood received in culture bottles Performed at [place] [address] [place], [place] [address]    Culture   Final    NO GROWTH 5 DAYS Performed at [facility], [place] [address] [place], [place] [address]    Report Status 08/25/2022 FINAL  Final  Surgical pcr screen     Status: Abnormal   Collection Time: 08/20/22  8:00 PM   Specimen: Nasal Mucosa; Nasal Swab  Result Value Ref Range Status   MRSA, PCR POSITIVE (A) NEGATIVE Final    Comment: CRITICAL RESULT CALLED TO, READ BACK BY AND VERIFIED WITH: Redmond School, RN 2145 MH    Staphylococcus aureus POSITIVE (A)  NEGATIVE Final    Comment: (NOTE) The Xpert SA Assay (FDA approved for NASAL specimens in patients 50 years of age and older), is one component of a comprehensive surveillance program. It is not intended to diagnose infection nor to guide or monitor treatment. Performed at [facility], [place] [address] [place], [place] [address]       Radiology Studies: No results found.    Scheduled Meds:  acetaminophen  1,000 mg Oral Once   carvedilol  6.25 mg Oral BID   enoxaparin (LOVENOX) injection  40 mg Subcutaneous Q24H   ferrous sulfate  325 mg Oral BID WC   folic acid  1 mg Oral BID   insulin aspart  0-20 Units Subcutaneous TID WC   insulin aspart  0-5 Units Subcutaneous QHS   insulin glargine-yfgn  20 Units Subcutaneous QHS   leptospermum manuka honey  1 Application Topical Daily   pravastatin  20 mg Oral QHS   sertraline  150 mg Oral Daily   Continuous Infusions:   ceFAZolin (ANCEF) IV     tranexamic acid     vancomycin 1,500 mg (08/26/22 0026)     LOS: 6 days     Phillips Climes, MD [facility] 08/26/2022, 9:20 AM   Available via Epic secure chat 7am-7pm After these hours, please refer to coverage provider listed on [email or web address]

## 2022-08-26 NOTE — Anesthesia Procedure Notes (Signed)
Anesthesia Regional Block: Adductor canal block   Pre-Anesthetic Checklist: , timeout performed,  Correct Patient, Correct Site, Correct Laterality,  Correct Procedure, Correct Position, site marked,  Risks and benefits discussed,  Surgical consent,  Pre-op evaluation,  At surgeon's request and post-op pain management  Laterality: Left  Prep: chloraprep       Needles:  Injection technique: Single-shot  Needle Type: Echogenic Needle     Needle Length: 9cm  Needle Gauge: 21     Additional Needles:   Procedures:,,,, ultrasound used (permanent image in chart),,    Narrative:  Start time: 08/26/2022 10:20 AM End time: 08/26/2022 10:25 AM Injection made incrementally with aspirations every 5 mL.  Performed by: Personally  Anesthesiologist: [name], MD  Additional Notes: No pain on injection. No increased resistance to injection. Injection made in 5cc increments.  Good needle visualization.  Patient tolerated procedure well.

## 2022-08-26 NOTE — Anesthesia Procedure Notes (Signed)
Procedure Name: MAC Date/Time: 08/26/2022 10:55 AM  Performed by: Inda Coke, CRNAPre-anesthesia Checklist: Patient identified, Emergency Drugs available, Suction available, Timeout performed and Patient being monitored Patient Re-evaluated:Patient Re-evaluated prior to induction Oxygen Delivery Method: Simple face mask Induction Type: IV induction Dental Injury: Teeth and Oropharynx as per pre-operative assessment

## 2022-08-27 DIAGNOSIS — K7581 Nonalcoholic steatohepatitis (NASH): Secondary | ICD-10-CM | POA: Diagnosis not present

## 2022-08-27 DIAGNOSIS — E119 Type 2 diabetes mellitus without complications: Secondary | ICD-10-CM

## 2022-08-27 DIAGNOSIS — D696 Thrombocytopenia, unspecified: Secondary | ICD-10-CM

## 2022-08-27 DIAGNOSIS — M86672 Other chronic osteomyelitis, left ankle and foot: Secondary | ICD-10-CM | POA: Diagnosis not present

## 2022-08-27 DIAGNOSIS — K7460 Unspecified cirrhosis of liver: Secondary | ICD-10-CM

## 2022-08-27 LAB — GLUCOSE, CAPILLARY
Glucose-Capillary: 237 mg/dL — ABNORMAL HIGH (ref 70–99)
Glucose-Capillary: 177 mg/dL — ABNORMAL HIGH (ref 70–99)
Glucose-Capillary: 140 mg/dL — ABNORMAL HIGH (ref 70–99)
Glucose-Capillary: 114 mg/dL — ABNORMAL HIGH (ref 70–99)

## 2022-08-27 LAB — CBC
HCT: 25.7 % — ABNORMAL LOW (ref 36.0–46.0)
Hemoglobin: 8.5 g/dL — ABNORMAL LOW (ref 12.0–15.0)
MCH: 31.7 pg (ref 26.0–34.0)
MCHC: 33.1 g/dL (ref 30.0–36.0)
MCV: 95.9 fL (ref 80.0–100.0)
Platelets: 62 10*3/uL — ABNORMAL LOW (ref 150–400)
RBC: 2.68 MIL/uL — ABNORMAL LOW (ref 3.87–5.11)
RDW: 14.6 % (ref 11.5–15.5)
WBC: 4 10*3/uL (ref 4.0–10.5)
nRBC: 0 % (ref 0.0–0.2)

## 2022-08-27 LAB — BASIC METABOLIC PANEL
Anion gap: 1 — ABNORMAL LOW (ref 5–15)
BUN: 27 mg/dL — ABNORMAL HIGH (ref 8–23)
CO2: 21 mmol/L — ABNORMAL LOW (ref 22–32)
Calcium: 7.7 mg/dL — ABNORMAL LOW (ref 8.9–10.3)
Chloride: 113 mmol/L — ABNORMAL HIGH (ref 98–111)
Creatinine, Ser: 0.79 mg/dL (ref 0.44–1.00)
GFR, Estimated: >60 mL/min (ref >60)
Glucose, Bld: 183 mg/dL — ABNORMAL HIGH (ref 70–99)
Potassium: 4.3 mmol/L (ref 3.5–5.1)
Sodium: 135 mmol/L (ref 135–145)

## 2022-08-27 MED ORDER — ENOXAPARIN SODIUM 30 MG/0.3ML IJ SOSY
30.0000 mg | PREFILLED_SYRINGE | INTRAMUSCULAR | Status: DC
  Administered 2022-08-28 – 2022-08-31 (×4): 30 mg via SUBCUTANEOUS
  Filled 2022-08-27 (×4): qty 0.3

## 2022-08-27 NOTE — Progress Notes (Signed)
Patient ID: Ariel Arnold, female   DOB: 11-27-1956, 66 y.o.   MRN: [id number] Patient is postoperative day 1 left transtibial amputation.  There is no drainage in the wound VAC canister there is a good suction fit.  Plan for physical therapy progressive ambulation.  Discussed the importance of patient getting out of bed into a chair at this time.  Anticipate discharge to inpatient versus outpatient rehab based on therapy recommendations.

## 2022-08-27 NOTE — Evaluation (Signed)
Physical Therapy Evaluation Patient Details Name: Ariel Arnold MRN: [id number] DOB: June 14, 1956 Today's Date: 08/27/2022  History of Present Illness  66 y.o. female admitted 9/1 with left transmet infection and dehiscence s/p Lt BKA 9/8. PMhx: Lt TMA, HTN, T2DM, PVD, Nash cirrhosis, chronic pancytopenia, HLD, depression  Clinical Impression  Patient admitted with the above. PTA, patient lives with daughter but unsure of level of independence as patient lethargic which suspect due to medications. Patient presents with pain, weakness, impaired balance, decreased activity tolerance, and impaired functional mobility. Patient lethargic so unable to complete education on limb protector, precautions, and phantom limb sensations. Patient required totalA+2 for bed mobility with no increase in arousal except patient able to state she felt nauseous. Patient will benefit from skilled PT services during acute stay to address listed deficits. Recommend SNF at discharge to maximize functional independence and safety.        Recommendations for follow up therapy are one component of a multi-disciplinary discharge planning process, led by the attending physician.  Recommendations may be updated based on patient status, additional functional criteria and insurance authorization.  Follow Up Recommendations Skilled nursing-short term rehab (<3 hours/day) Can patient physically be transported by private vehicle: No    Assistance Recommended at Discharge Frequent or constant Supervision/Assistance  Patient can return home with the following  Two people to help with walking and/or transfers;A lot of help with bathing/dressing/bathroom;Assistance with cooking/housework;Direct supervision/assist for medications management;Direct supervision/assist for financial management;Assist for transportation;Help with stairs or ramp for entrance    Equipment Recommendations Other (comment) (defer to post acute rehab)   Recommendations for Other Services       Functional Status Assessment Patient has had a recent decline in their functional status and demonstrates the ability to make significant improvements in function in a reasonable and predictable amount of time.     Precautions / Restrictions Precautions Precautions: Fall Precaution Comments: L limb protector Restrictions Weight Bearing Restrictions: Yes LLE Weight Bearing: Non weight bearing      Mobility  Bed Mobility Overal bed mobility: Needs Assistance Bed Mobility: Supine to Sit, Sit to Supine     Supine to sit: Total assist, +2 for physical assistance, +2 for safety/equipment Sit to supine: Total assist, +2 for physical assistance, +2 for safety/equipment   General bed mobility comments: patient with minimal initiation of bed mobility this date. When she was able to move, movement was ineffective. Overall totalA+2 to reach EOB and return to supine    Transfers                   General transfer comment: deferred    Ambulation/Gait                  Stairs            Wheelchair Mobility    Modified Rankin (Stroke Patients Only)       Balance Overall balance assessment: Needs assistance Sitting-balance support: No upper extremity supported, Feet supported Sitting balance-Leahy Scale: Fair Sitting balance - Comments: minA-min guard for sitting balance                                     Pertinent Vitals/Pain Pain Assessment Pain Assessment: Faces Faces Pain Scale: Hurts even more Pain Location: L residual limb Pain Descriptors / Indicators: Guarding, Grimacing Pain Intervention(s): Monitored during session, Repositioned    Home Living Family/patient expects to  be discharged to:: Private residence Living Arrangements: Children Available Help at Discharge: Family;Available 24 hours/day Type of Home: House Home Access: Stairs to enter Entrance Stairs-Rails: None Entrance  Stairs-Number of Steps: 3   Home Layout: One level Home Equipment: Grab bars - tub/shower;Rolling Keltin Baird (2 wheels);Wheelchair - Sport and exercise psychologist Comments: pt reports he daugter lives with her and can assist her    Prior Function Prior Level of Function : Needs assist;Independent/Modified Independent             Mobility Comments: pt reports she was ambulating with RW, but is not forthcoming with overall independence level       Hand Dominance        Extremity/Trunk Assessment   Upper Extremity Assessment Upper Extremity Assessment: Defer to OT evaluation    Lower Extremity Assessment Lower Extremity Assessment: Generalized weakness;LLE deficits/detail LLE Deficits / Details: s/p L BKA; difficult to assess due to lethargy    Cervical / Trunk Assessment Cervical / Trunk Assessment: Kyphotic  Communication   Communication: No difficulties  Cognition Arousal/Alertness: Lethargic, Suspect due to medications Behavior During Therapy: Flat affect Overall Cognitive Status: No family/caregiver present to determine baseline cognitive functioning                                 General Comments: difficult to fully assess due to lethargy which seems to be due to medications. Slow processing throughout and difficulty following commands. Keeps eyes closed majority of session but opens to name being called. At times talking about irrelevant things        General Comments General comments (skin integrity, edema, etc.): VSS on RA    Exercises     Assessment/Plan    PT Assessment Patient needs continued PT services  PT Problem List Decreased strength;Decreased activity tolerance;Decreased balance;Decreased mobility;Decreased cognition;Decreased knowledge of use of DME;Decreased safety awareness;Decreased knowledge of precautions       PT Treatment Interventions DME instruction;Gait training;Functional mobility training;Therapeutic  activities;Therapeutic exercise;Balance training;Patient/family education    PT Goals (Current goals can be found in the Care Plan section)  Acute Rehab PT Goals Patient Stated Goal: did not state PT Goal Formulation: Patient unable to participate in goal setting Time For Goal Achievement: 09/10/22 Potential to Achieve Goals: Fair    Frequency Min 3X/week     Co-evaluation PT/OT/SLP Co-Evaluation/Treatment: Yes Reason for Co-Treatment: Necessary to address cognition/behavior during functional activity;For patient/therapist safety;To address functional/ADL transfers PT goals addressed during session: Mobility/safety with mobility         AM-PAC PT "6 Clicks" Mobility  Outcome Measure Help needed turning from your back to your side while in a flat bed without using bedrails?: Total Help needed moving from lying on your back to sitting on the side of a flat bed without using bedrails?: Total Help needed moving to and from a bed to a chair (including a wheelchair)?: Total Help needed standing up from a chair using your arms (e.g., wheelchair or bedside chair)?: Total Help needed to walk in hospital room?: Total Help needed climbing 3-5 steps with a railing? : Total 6 Click Score: 6    End of Session   Activity Tolerance: Patient limited by lethargy Patient left: in bed;with call bell/phone within reach;with bed alarm set Nurse Communication: Mobility status;Other (comment) (lethargy) PT Visit Diagnosis: Muscle weakness (generalized) (M62.81);Other abnormalities of gait and mobility (R26.89)    Time: 9476-5465 PT Time Calculation (min) (ACUTE ONLY): 23  min   Charges:   PT Evaluation $PT Eval Moderate Complexity: 1 Mod          Brendan Gruwell A. Gilford Rile PT, DPT Acute Rehabilitation Services Office [phone]   Linna Hoff 08/27/2022, 12:52 PM

## 2022-08-27 NOTE — Evaluation (Signed)
Occupational Therapy Evaluation Patient Details Name: Ariel Arnold MRN: [id number] DOB: February 26, 1956 Today's Date: 08/27/2022   History of Present Illness 66 y.o. female admitted 9/1 with left transmet infection and dehiscence s/p Lt BKA 9/8. PMhx: Lt TMA, HTN, T2DM, PVD, Nash cirrhosis, chronic pancytopenia, HLD, depression   Clinical Impression   PTA, pt lives with her daughter, but difficulty reporting prior level of function due to lethargy. Pt performing bed mobility with total A due to lethargy, but sitting EOB with min guard-min A and washing face with min guard A. Pt requiring max A for LB ADL and min guard-min A for UB ADL at this time. Pt with decreased cognition this session, but believe due to medications. Pt with decreased balance, strength, and arousal. Pt reporting nauseous and returned to supine. Recommending SNF for continued OT services to optimize safety and independence in ADL and IADL.      Recommendations for follow up therapy are one component of a multi-disciplinary discharge planning process, led by the attending physician.  Recommendations may be updated based on patient status, additional functional criteria and insurance authorization.   Follow Up Recommendations  Skilled nursing-short term rehab (<3 hours/day)    Assistance Recommended at Discharge Frequent or constant Supervision/Assistance  Patient can return home with the following Two people to help with walking and/or transfers;Two people to help with bathing/dressing/bathroom;Assistance with cooking/housework;Assistance with feeding;Direct supervision/assist for medications management;Assist for transportation;Direct supervision/assist for financial management;Help with stairs or ramp for entrance    Functional Status Assessment  Patient has had a recent decline in their functional status and demonstrates the ability to make significant improvements in function in a reasonable and predictable amount of time.   Equipment Recommendations  Other (comment) (defer to next venue)    Recommendations for Other Services       Precautions / Restrictions Precautions Precautions: Fall Precaution Comments: L limb protector Restrictions Weight Bearing Restrictions: Yes LLE Weight Bearing: Non weight bearing      Mobility Bed Mobility Overal bed mobility: Needs Assistance Bed Mobility: Supine to Sit, Sit to Supine     Supine to sit: Total assist, +2 for physical assistance, +2 for safety/equipment Sit to supine: Total assist, +2 for physical assistance, +2 for safety/equipment   General bed mobility comments: patient with minimal initiation of bed mobility this date. When she was able to move, movement was ineffective. Overall totalA+2 to reach EOB and return to supine    Transfers                   General transfer comment: deferred      Balance Overall balance assessment: Needs assistance Sitting-balance support: No upper extremity supported, Feet supported Sitting balance-Leahy Scale: Fair Sitting balance - Comments: minA-min guard for sitting balance                                   ADL either performed or assessed with clinical judgement   ADL Overall ADL's : Needs assistance/impaired     Grooming: Wash/dry face;Sitting;Min guard   Upper Body Bathing: Min guard;Sitting   Lower Body Bathing: Maximal assistance;Sitting/lateral leans   Upper Body Dressing : Min guard;Sitting;Cueing for sequencing   Lower Body Dressing: +2 for physical assistance;Sitting/lateral leans;Bed level;+2 for safety/equipment;Total assistance   Toilet Transfer: Total assistance;+2 for physical assistance;+2 for safety/equipment (lateral scoot with total A this session)  Functional mobility during ADLs: Total assistance;+2 for physical assistance General ADL Comments: Limited due to lethargy, inability to attend, and limited ability to follow commands. Suspect due to  medications     Vision   Vision Assessment?: No apparent visual deficits Additional Comments: Pt closing eyes for much of session, sustect due to medications.     Perception     Praxis      Pertinent Vitals/Pain Pain Assessment Pain Assessment: Faces Faces Pain Scale: Hurts even more Pain Location: L residual limb Pain Descriptors / Indicators: Guarding, Grimacing Pain Intervention(s): Limited activity within patient's tolerance, Monitored during session, Repositioned     Hand Dominance     Extremity/Trunk Assessment Upper Extremity Assessment Upper Extremity Assessment: Generalized weakness   Lower Extremity Assessment Lower Extremity Assessment: LLE deficits/detail LLE Deficits / Details: s/p L BKA; difficult to assess due to lethargy   Cervical / Trunk Assessment Cervical / Trunk Assessment: Kyphotic   Communication Communication Communication: No difficulties   Cognition Arousal/Alertness: Lethargic, Suspect due to medications Behavior During Therapy: Flat affect Overall Cognitive Status: No family/caregiver present to determine baseline cognitive functioning                                 General Comments: difficult to fully assess due to lethargy which seems to be due to medications. Slow processing throughout and difficulty following commands. Keeps eyes closed majority of session but opens to name being called. At times with tangential conversation     General Comments  VSS    Exercises     Shoulder Instructions      Home Living Family/patient expects to be discharged to:: Private residence Living Arrangements: Children Available Help at Discharge: Family;Available 24 hours/day Type of Home: House Home Access: Stairs to enter [employer] of Steps: 3 Entrance Stairs-Rails: None Home Layout: One level     Bathroom Shower/Tub: Teacher, early years/pre: Standard Bathroom Accessibility: Yes   Home Equipment:  Grab bars - tub/shower;Rolling Walker (2 wheels);Wheelchair - Biomedical scientist Comments: pt reports he daugter lives with her and can assist her      Prior Functioning/Environment Prior Level of Function : Needs assist;Independent/Modified Independent             Mobility Comments: pt reports she was ambulating with RW, but is not forthcoming with overall independence level ADLs Comments: patient reported being independent in ADLs at home prior to hospitalization        OT Problem List: Decreased strength;Decreased activity tolerance;Decreased range of motion;Impaired balance (sitting and/or standing);Decreased cognition;Decreased safety awareness;Decreased knowledge of use of DME or AE;Decreased knowledge of precautions;Pain      OT Treatment/Interventions: Self-care/ADL training;Therapeutic exercise;Therapeutic activities;Cognitive remediation/compensation;Patient/family education;Balance training;DME and/or AE instruction    OT Goals(Current goals can be found in the care plan section) Acute Rehab OT Goals Patient Stated Goal: no pain OT Goal Formulation: With patient Time For Goal Achievement: 09/10/22 Potential to Achieve Goals: Good  OT Frequency: Min 2X/week    Co-evaluation PT/OT/SLP Co-Evaluation/Treatment: Yes Reason for Co-Treatment: Necessary to address cognition/behavior during functional activity;For patient/therapist safety;To address functional/ADL transfers PT goals addressed during session: Mobility/safety with mobility        AM-PAC OT "6 Clicks" Daily Activity     Outcome Measure Help from another person eating meals?: A Little Help from another person taking care of personal grooming?: A Little Help from another person toileting, which includes  using toliet, bedpan, or urinal?: A Lot Help from another person bathing (including washing, rinsing, drying)?: A Lot Help from another person to put on and taking off regular upper body  clothing?: A Little Help from another person to put on and taking off regular lower body clothing?: A Lot 6 Click Score: 15   End of Session Nurse Communication: Mobility status;Other (comment) (lethargy, needs someone to order her luch as cannot cognitively sequence to do so)  Activity Tolerance: Patient limited by lethargy Patient left: in bed;with call bell/phone within reach;with bed alarm set  OT Visit Diagnosis: Unsteadiness on feet (R26.81);Other abnormalities of gait and mobility (R26.89);Muscle weakness (generalized) (M62.81);Other symptoms and signs involving cognitive function;Pain Pain - Right/Left: Left Pain - part of body: Leg (resildual limb)                Time: 9678-9381 OT Time Calculation (min): 23 min Charges:  OT General Charges $OT Visit: 1 Visit OT Evaluation $OT Eval Moderate Complexity: 1 Mod  Shanda Howells, OTR/L [facility] Acute Rehabilitation Office: [phone]   Lula Olszewski 08/27/2022, 3:21 PM

## 2022-08-27 NOTE — Progress Notes (Signed)
Inpatient Rehab Admissions Coordinator:  Consult received. PT/OT recommending SNF placement for therapy. Pt does not appear to be able to tolerate the intensity of CIR. AC will sign off.   Gayland Curry, [place], [place] Admissions Coordinator [phone]

## 2022-08-27 NOTE — Progress Notes (Signed)
PROGRESS NOTE    Ariel Arnold  XTK:[id number] DOB: [birth year] DOA: 08/19/2022 PCP: Jacqualine Code, DO     Brief Narrative:   Ariel Arnold is a 66 year old female with DM2, HTN, NASH cirrhosis with prior hepatic encephalopathy, HTN, previous MRSA foot wound infection, status post transmetatarsal amputation in April 2023 due to osteomyelitis, came into the hospital for worsening left foot pain, redness, and discharge at home.  X-rays in the ED showed concern for acute osteomyelitis. Podiatry and orthopedic surgery consulted, recommended evaluation by Dr. Sharol Given for BKA.  Dr. Sharol Given planning for surgical intervention 9/8  Subjective: No significant events overnight , pain is controlled.  Assessment & Plan:   Principal Problem:   Chronic osteomyelitis involving ankle and foot, left (HCC) Active Problems:   Liver cirrhosis secondary to NASH (HCC)   Thrombocytopenia (HCC)   Coronary atherosclerosis   Generalized anxiety disorder   Mixed hyperlipidemia   Type 2 diabetes mellitus treated with insulin (HCC)   Essential hypertension   Lactic acidosis   Diabetic osteomyelitis (HCC)   Left lower extremity osteomyelitis -Is on IV vancomycin and cefepime, will DC as she had source control with left BKA yesterday. -ID following -Seen by orthopedic, Dr. Sharol Given, status post r left transtibial amputation 9/8 -Continue with as needed pain meds -Seen by PT/OT, will need subacute rehab  NASH cirrhosis with pancytopenia -Stable  Hypertension -Coreg  Chronic diastolic heart failure -Without exacerbation  Hyperlipidemia -Pravachol  Diabetes mellitus type 2 -Semglee, sliding scale insulin  Thrombocytopenia -Chronic, at baseline, continue to monitor closely as well DVT prophylaxis -Likely due to [facility] cirrhosis  DVT prophylaxis:  SCD's Start: 08/26/22 1242 enoxaparin (LOVENOX) injection 40 mg Start: 08/20/22 1000  Code Status: Full code Family Communication: No family at  bedside Disposition Plan: Inpatient rehab Status is: Inpatient   Consultants:  Podiatry ID Orthopedic surgery   Antimicrobials:  Anti-infectives (From admission, onward)    Start     Dose/Rate Route Frequency Ordered Stop   08/26/22 1330  ceFAZolin (ANCEF) IVPB 2g/100 mL premix  Status:  Discontinued        2 g 200 mL/hr over 30 Minutes Intravenous Every 8 hours 08/26/22 1241 08/26/22 1300   08/26/22 0745  ceFAZolin (ANCEF) IVPB 2g/100 mL premix        2 g 200 mL/hr over 30 Minutes Intravenous On call to O.R. 08/26/22 0656 08/26/22 1102   08/21/22 0100  vancomycin (VANCOREADY) IVPB 1500 mg/300 mL        1,500 mg 150 mL/hr over 120 Minutes Intravenous Every 24 hours 08/20/22 0050 08/27/22 0343   08/20/22 0800  ceFEPIme (MAXIPIME) 2 g in sodium chloride 0.9 % 100 mL IVPB  Status:  Discontinued        2 g 200 mL/hr over 30 Minutes Intravenous Every 8 hours 08/20/22 0033 08/24/22 1205   08/20/22 0015  vancomycin (VANCOREADY) IVPB 1500 mg/300 mL        1,500 mg 150 mL/hr over 120 Minutes Intravenous  Once 08/20/22 0012 08/20/22 0300   08/19/22 2345  vancomycin (VANCOCIN) IVPB 1000 mg/200 mL premix  Status:  Discontinued        1,000 mg 200 mL/hr over 60 Minutes Intravenous  Once 08/19/22 2334 08/20/22 0012   08/19/22 2345  ceFEPIme (MAXIPIME) 2 g in sodium chloride 0.9 % 100 mL IVPB        2 g 200 mL/hr over 30 Minutes Intravenous  Once 08/19/22 2334 08/20/22 0030  Objective: Vitals:   08/26/22 1937 08/26/22 2334 08/27/22 0330 08/27/22 0915  BP: (!) 121/48 107/75 (!) 105/53 (!) 113/51  Pulse: 70 77 73 68  Resp: 18 16 16 19   Temp: 98.2 F (36.8 C) 98 F (36.7 C) 97.9 F (36.6 C) 98.4 F (36.9 C)  TempSrc: Oral Oral Oral Oral  SpO2: 95% 96% 92% 94%  Weight:      Height:        Intake/Output Summary (Last 24 hours) at 08/27/2022 1434 Last data filed at 08/26/2022 1505 Gross per 24 hour  Intake 29.35 ml  Output 0 ml  Net 29.35 ml   Filed Weights   08/19/22  2103 08/21/22 0550  Weight: 79.8 kg 86.7 kg    Examination:   Awake Alert, Oriented X 3, No new F.N deficits, Normal affect, frail, deconditioned Symmetrical Chest wall movement, Good air movement bilaterally, CTAB RRR,No Gallops,Rubs or new Murmurs, No Parasternal Heave +ve B.Sounds, Abd Soft, No tenderness, No rebound - guarding or rigidity. Left Lower extremity status post BKA, with shrinker, VAC canister with good suction, no drainage in the VAC canister -   Data Reviewed: I have personally reviewed following labs and imaging studies  CBC: Recent Labs  Lab 08/22/22 0402 08/24/22 0441 08/27/22 1106  WBC 3.0* 4.1 4.0  HGB 8.2* 9.6* 8.5*  HCT 25.2* 29.7* 25.7*  MCV 97.7 97.7 95.9  PLT 88* 90* 62*   Basic Metabolic Panel: Recent Labs  Lab 08/22/22 0402 08/24/22 0441 08/27/22 1106  NA 136 136 135  K 3.9 3.8 4.3  CL 111 108 113*  CO2 21* 25 21*  GLUCOSE 143* 199* 183*  BUN 21 22 27*  CREATININE 0.80 0.76 0.79  CALCIUM 7.8* 8.5* 7.7*  MG 1.7 1.8  --   PHOS 2.6  --   --    GFR: Estimated Creatinine Clearance: 76.1 mL/min (by C-G formula based on SCr of 0.79 mg/dL). Liver Function Tests: Recent Labs  Lab 08/22/22 0402  AST 23  ALT 14  ALKPHOS 96  BILITOT 0.9  PROT 5.8*  ALBUMIN 2.0*   No results for input(s): "LIPASE", "AMYLASE" in the last 168 hours. No results for input(s): "AMMONIA" in the last 168 hours. Coagulation Profile: No results for input(s): "INR", "PROTIME" in the last 168 hours. Cardiac Enzymes: No results for input(s): "CKTOTAL", "CKMB", "CKMBINDEX", "TROPONINI" in the last 168 hours. BNP (last 3 results) No results for input(s): "PROBNP" in the last 8760 hours. HbA1C: No results for input(s): "HGBA1C" in the last 72 hours. CBG: Recent Labs  Lab 08/26/22 1234 08/26/22 1534 08/26/22 2115 08/27/22 0839 08/27/22 1130  GLUCAP 114* 195* 144* 237* 177*   Lipid Profile: No results for input(s): "CHOL", "HDL", "LDLCALC", "TRIG",  "CHOLHDL", "LDLDIRECT" in the last 72 hours. Thyroid Function Tests: No results for input(s): "TSH", "T4TOTAL", "FREET4", "T3FREE", "THYROIDAB" in the last 72 hours. Anemia Panel: No results for input(s): "VITAMINB12", "FOLATE", "FERRITIN", "TIBC", "IRON", "RETICCTPCT" in the last 72 hours. Sepsis Labs: No results for input(s): "PROCALCITON", "LATICACIDVEN" in the last 168 hours.   Recent Results (from the past 240 hour(s))  Culture, blood (Routine X 2) w Reflex to ID Panel     Status: None   Collection Time: 08/20/22 11:47 AM   Specimen: BLOOD  Result Value Ref Range Status   Specimen Description   Final    BLOOD LEFT ANTECUBITAL Performed at [facility] [address], [place], [place] [address]    Special Requests   Final  BOTTLES DRAWN AEROBIC AND ANAEROBIC Blood Culture results may not be optimal due to an inadequate volume of blood received in culture bottles Performed at [facility], [place] [address], [place], [place] [address]    Culture   Final    NO GROWTH 5 DAYS Performed at [facility], [place] [address] [place], [place] [address]    Report Status 08/25/2022 FINAL  Final  Culture, blood (Routine X 2) w Reflex to ID Panel     Status: None   Collection Time: 08/20/22 11:47 AM   Specimen: BLOOD  Result Value Ref Range Status   Specimen Description   Final    BLOOD LEFT ANTECUBITAL Performed at [facility] [address] [place], [place] [address]    Special Requests   Final    BOTTLES DRAWN AEROBIC AND ANAEROBIC Blood Culture results may not be optimal due to an inadequate volume of blood received in culture bottles Performed at [facility] [address], [place], [place] [address]    Culture   Final    NO GROWTH 5 DAYS Performed at [facility], [place] [address] [place], [place] [address]    Report Status 08/25/2022 FINAL  Final  Surgical pcr screen     Status: Abnormal    Collection Time: 08/20/22  8:00 PM   Specimen: Nasal Mucosa; Nasal Swab  Result Value Ref Range Status   MRSA, PCR POSITIVE (A) NEGATIVE Final    Comment: CRITICAL RESULT CALLED TO, READ BACK BY AND VERIFIED WITH: Redmond School, RN 2145 MH    Staphylococcus aureus POSITIVE (A) NEGATIVE Final    Comment: (NOTE) The Xpert SA Assay (FDA approved for NASAL specimens in patients 69 years of age and older), is one component of a comprehensive surveillance program. It is not intended to diagnose infection nor to guide or monitor treatment. Performed at [facility], [place] [address] [place], [place] [address]       Radiology Studies: No results found.    Scheduled Meds:  vitamin C  1,000 mg Oral Daily   carvedilol  6.25 mg Oral BID   docusate sodium  100 mg Oral Daily   enoxaparin (LOVENOX) injection  40 mg Subcutaneous Q24H   fentaNYL (SUBLIMAZE) injection  50 mcg Intravenous Once   ferrous sulfate  325 mg Oral BID WC   folic acid  1 mg Oral BID   insulin aspart  0-20 Units Subcutaneous TID WC   insulin aspart  0-5 Units Subcutaneous QHS   insulin glargine-yfgn  20 Units Subcutaneous QHS   leptospermum manuka honey  1 Application Topical Daily   nutrition supplement (JUVEN)  1 packet Oral BID BM   pantoprazole  40 mg Oral Daily   pravastatin  20 mg Oral QHS   sertraline  150 mg Oral Daily   zinc sulfate  220 mg Oral Daily   Continuous Infusions:  sodium chloride 75 mL/hr at 08/27/22 0143   magnesium sulfate bolus IVPB       LOS: 7 days     Phillips Climes, MD [facility] 08/27/2022, 2:34 PM   Available via Epic secure chat 7am-7pm After these hours, please refer to coverage provider listed on [email or web address]

## 2022-08-28 DIAGNOSIS — M86672 Other chronic osteomyelitis, left ankle and foot: Secondary | ICD-10-CM | POA: Diagnosis not present

## 2022-08-28 LAB — CBC
HCT: 26.2 % — ABNORMAL LOW (ref 36.0–46.0)
Hemoglobin: 8.5 g/dL — ABNORMAL LOW (ref 12.0–15.0)
MCH: 31.3 pg (ref 26.0–34.0)
MCHC: 32.4 g/dL (ref 30.0–36.0)
MCV: 96.3 fL (ref 80.0–100.0)
Platelets: 58 10*3/uL — ABNORMAL LOW (ref 150–400)
RBC: 2.72 MIL/uL — ABNORMAL LOW (ref 3.87–5.11)
RDW: 14.8 % (ref 11.5–15.5)
WBC: 4 10*3/uL (ref 4.0–10.5)
nRBC: 0 % (ref 0.0–0.2)

## 2022-08-28 LAB — BASIC METABOLIC PANEL
Anion gap: 3 — ABNORMAL LOW (ref 5–15)
BUN: 31 mg/dL — ABNORMAL HIGH (ref 8–23)
CO2: 22 mmol/L (ref 22–32)
Calcium: 8.3 mg/dL — ABNORMAL LOW (ref 8.9–10.3)
Chloride: 110 mmol/L (ref 98–111)
Creatinine, Ser: 0.82 mg/dL (ref 0.44–1.00)
GFR, Estimated: >60 mL/min (ref >60)
Glucose, Bld: 128 mg/dL — ABNORMAL HIGH (ref 70–99)
Potassium: 4.2 mmol/L (ref 3.5–5.1)
Sodium: 135 mmol/L (ref 135–145)

## 2022-08-28 LAB — GLUCOSE, CAPILLARY
Glucose-Capillary: 81 mg/dL (ref 70–99)
Glucose-Capillary: 154 mg/dL — ABNORMAL HIGH (ref 70–99)
Glucose-Capillary: 128 mg/dL — ABNORMAL HIGH (ref 70–99)
Glucose-Capillary: 124 mg/dL — ABNORMAL HIGH (ref 70–99)

## 2022-08-28 NOTE — Plan of Care (Signed)
  Problem: Education: Goal: Ability to describe self-care measures that may prevent or decrease complications (Diabetes Survival Skills Education) will improve Outcome: Progressing Goal: Individualized Educational Video(s) Outcome: Progressing   

## 2022-08-28 NOTE — Progress Notes (Signed)
PROGRESS NOTE    Ariel Arnold  SWN:[id number] DOB: Mar 27, 1956 DOA: 08/19/2022 PCP: Jacqualine Code, DO     Brief Narrative:   Ariel Arnold is a 66 year old female with DM2, HTN, NASH cirrhosis with prior hepatic encephalopathy, HTN, previous MRSA foot wound infection, status post transmetatarsal amputation in April 2023 due to osteomyelitis, came into the hospital for worsening left foot pain, redness, and discharge at home.  X-rays in the ED showed concern for acute osteomyelitis. Podiatry and orthopedic surgery consulted, recommended evaluation by Dr. Sharol Given for BKA.  Dr. Sharol Given planning for surgical intervention 9/8  Subjective: No significant events overnight , pain is controlled.  She does report no further nausea, appetite has improved.  Assessment & Plan:   Principal Problem:   Chronic osteomyelitis involving ankle and foot, left (HCC) Active Problems:   Liver cirrhosis secondary to NASH (HCC)   Thrombocytopenia (HCC)   Coronary atherosclerosis   Generalized anxiety disorder   Mixed hyperlipidemia   Type 2 diabetes mellitus treated with insulin (HCC)   Essential hypertension   Lactic acidosis   Diabetic osteomyelitis (HCC)   Left lower extremity osteomyelitis -Is on IV vancomycin and cefepime, will DC as she had source control with left BKA yesterday. -ID following -Seen by orthopedic, Dr. Sharol Given, status post r left transtibial amputation 9/8 -Continue with as needed pain meds -Seen by PT/OT, will need subacute rehab  NASH cirrhosis with pancytopenia -Stable  Hypertension -Coreg  Chronic diastolic heart failure -Without exacerbation  Hyperlipidemia -Pravachol  Diabetes mellitus type 2 -Semglee, sliding scale insulin  Thrombocytopenia -Chronic, at baseline, continue to monitor closely as well DVT prophylaxis -Likely due to [facility] cirrhosis  DVT prophylaxis:  enoxaparin (LOVENOX) injection 30 mg Start: 08/28/22 1000 SCD's Start: 08/26/22 1242  Code  Status: Full code Family Communication: No family at bedside Disposition Plan: SNF Status is: Inpatient   Consultants:  Podiatry ID Orthopedic surgery   Antimicrobials:  Anti-infectives (From admission, onward)    Start     Dose/Rate Route Frequency Ordered Stop   08/26/22 1330  ceFAZolin (ANCEF) IVPB 2g/100 mL premix  Status:  Discontinued        2 g 200 mL/hr over 30 Minutes Intravenous Every 8 hours 08/26/22 1241 08/26/22 1300   08/26/22 0745  ceFAZolin (ANCEF) IVPB 2g/100 mL premix        2 g 200 mL/hr over 30 Minutes Intravenous On call to O.R. 08/26/22 0656 08/26/22 1102   08/21/22 0100  vancomycin (VANCOREADY) IVPB 1500 mg/300 mL        1,500 mg 150 mL/hr over 120 Minutes Intravenous Every 24 hours 08/20/22 0050 08/27/22 0343   08/20/22 0800  ceFEPIme (MAXIPIME) 2 g in sodium chloride 0.9 % 100 mL IVPB  Status:  Discontinued        2 g 200 mL/hr over 30 Minutes Intravenous Every 8 hours 08/20/22 0033 08/24/22 1205   08/20/22 0015  vancomycin (VANCOREADY) IVPB 1500 mg/300 mL        1,500 mg 150 mL/hr over 120 Minutes Intravenous  Once 08/20/22 0012 08/20/22 0300   08/19/22 2345  vancomycin (VANCOCIN) IVPB 1000 mg/200 mL premix  Status:  Discontinued        1,000 mg 200 mL/hr over 60 Minutes Intravenous  Once 08/19/22 2334 08/20/22 0012   08/19/22 2345  ceFEPIme (MAXIPIME) 2 g in sodium chloride 0.9 % 100 mL IVPB        2 g 200 mL/hr over 30 Minutes Intravenous  Once  08/19/22 2334 08/20/22 0030        Objective: Vitals:   08/27/22 1600 08/28/22 0411 08/28/22 0837 08/28/22 1236  BP: (!) 98/57 (!) 108/46 (!) 115/49 (!) 106/48  Pulse: 68 64 75 69  Resp:  18 18 18   Temp:  98.3 F (36.8 C) 98.6 F (37 C) 98 F (36.7 C)  TempSrc:  Oral Oral Oral  SpO2: 94% 98% 94% 94%  Weight:      Height:       No intake or output data in the 24 hours ending 08/28/22 1300  Filed Weights   08/19/22 2103 08/21/22 0550  Weight: 79.8 kg 86.7 kg    Examination:   Awake  Alert, Oriented X 3, frail, deconditioned Symmetrical Chest wall movement, Good air movement bilaterally, CTAB RRR,No Gallops,Rubs or new Murmurs, No Parasternal Heave +ve B.Sounds, Abd Soft, No tenderness, No rebound - guarding or rigidity. Left Lower extremity status post BKA, with shrinker, VAC canister with good suction, no drainage in the VAC canister -   Data Reviewed: I have personally reviewed following labs and imaging studies  CBC: Recent Labs  Lab 08/22/22 0402 08/24/22 0441 08/27/22 1106 08/28/22 0230  WBC 3.0* 4.1 4.0 4.0  HGB 8.2* 9.6* 8.5* 8.5*  HCT 25.2* 29.7* 25.7* 26.2*  MCV 97.7 97.7 95.9 96.3  PLT 88* 90* 62* 58*   Basic Metabolic Panel: Recent Labs  Lab 08/22/22 0402 08/24/22 0441 08/27/22 1106 08/28/22 0230  NA 136 136 135 135  K 3.9 3.8 4.3 4.2  CL 111 108 113* 110  CO2 21* 25 21* 22  GLUCOSE 143* 199* 183* 128*  BUN 21 22 27* 31*  CREATININE 0.80 0.76 0.79 0.82  CALCIUM 7.8* 8.5* 7.7* 8.3*  MG 1.7 1.8  --   --   PHOS 2.6  --   --   --    GFR: Estimated Creatinine Clearance: 74.2 mL/min (by C-G formula based on SCr of 0.82 mg/dL). Liver Function Tests: Recent Labs  Lab 08/22/22 0402  AST 23  ALT 14  ALKPHOS 96  BILITOT 0.9  PROT 5.8*  ALBUMIN 2.0*   No results for input(s): "LIPASE", "AMYLASE" in the last 168 hours. No results for input(s): "AMMONIA" in the last 168 hours. Coagulation Profile: No results for input(s): "INR", "PROTIME" in the last 168 hours. Cardiac Enzymes: No results for input(s): "CKTOTAL", "CKMB", "CKMBINDEX", "TROPONINI" in the last 168 hours. BNP (last 3 results) No results for input(s): "PROBNP" in the last 8760 hours. HbA1C: No results for input(s): "HGBA1C" in the last 72 hours. CBG: Recent Labs  Lab 08/27/22 1130 08/27/22 1644 08/27/22 2114 08/28/22 0839 08/28/22 1152  GLUCAP 177* 140* 114* 81 154*   Lipid Profile: No results for input(s): "CHOL", "HDL", "LDLCALC", "TRIG", "CHOLHDL", "LDLDIRECT"  in the last 72 hours. Thyroid Function Tests: No results for input(s): "TSH", "T4TOTAL", "FREET4", "T3FREE", "THYROIDAB" in the last 72 hours. Anemia Panel: No results for input(s): "VITAMINB12", "FOLATE", "FERRITIN", "TIBC", "IRON", "RETICCTPCT" in the last 72 hours. Sepsis Labs: No results for input(s): "PROCALCITON", "LATICACIDVEN" in the last 168 hours.   Recent Results (from the past 240 hour(s))  Culture, blood (Routine X 2) w Reflex to ID Panel     Status: None   Collection Time: 08/20/22 11:47 AM   Specimen: BLOOD  Result Value Ref Range Status   Specimen Description   Final    BLOOD LEFT ANTECUBITAL Performed at [facility] [address] [place],  [address]  Special Requests   Final    BOTTLES DRAWN AEROBIC AND ANAEROBIC Blood Culture results may not be optimal due to an inadequate volume of blood received in culture bottles Performed at [facility] [address] [place], [place] [address]    Culture   Final    NO GROWTH 5 DAYS Performed at [facility], [place] [address] [place], [place] [address]    Report Status 08/25/2022 FINAL  Final  Culture, blood (Routine X 2) w Reflex to ID Panel     Status: None   Collection Time: 08/20/22 11:47 AM   Specimen: BLOOD  Result Value Ref Range Status   Specimen Description   Final    BLOOD LEFT ANTECUBITAL Performed at [facility] [address] [place], [place] [address]    Special Requests   Final    BOTTLES DRAWN AEROBIC AND ANAEROBIC Blood Culture results may not be optimal due to an inadequate volume of blood received in culture bottles Performed at [facility] [address], [place], [place] [address]    Culture   Final    NO GROWTH 5 DAYS Performed at [facility], [place] [address] [place], [place] [address]    Report Status 08/25/2022 FINAL  Final  Surgical pcr screen     Status: Abnormal   Collection Time:  08/20/22  8:00 PM   Specimen: Nasal Mucosa; Nasal Swab  Result Value Ref Range Status   MRSA, PCR POSITIVE (A) NEGATIVE Final    Comment: CRITICAL RESULT CALLED TO, READ BACK BY AND VERIFIED WITH: Redmond School, RN 2145 MH    Staphylococcus aureus POSITIVE (A) NEGATIVE Final    Comment: (NOTE) The Xpert SA Assay (FDA approved for NASAL specimens in patients 31 years of age and older), is one component of a comprehensive surveillance program. It is not intended to diagnose infection nor to guide or monitor treatment. Performed at [facility], [place] [address], [place], [place] [address]       Radiology Studies: No results found.    Scheduled Meds:  vitamin C  1,000 mg Oral Daily   carvedilol  6.25 mg Oral BID   docusate sodium  100 mg Oral Daily   enoxaparin (LOVENOX) injection  30 mg Subcutaneous Q24H   fentaNYL (SUBLIMAZE) injection  50 mcg Intravenous Once   ferrous sulfate  325 mg Oral BID WC   folic acid  1 mg Oral BID   insulin aspart  0-20 Units Subcutaneous TID WC   insulin aspart  0-5 Units Subcutaneous QHS   insulin glargine-yfgn  20 Units Subcutaneous QHS   leptospermum manuka honey  1 Application Topical Daily   nutrition supplement (JUVEN)  1 packet Oral BID BM   pantoprazole  40 mg Oral Daily   pravastatin  20 mg Oral QHS   sertraline  150 mg Oral Daily   zinc sulfate  220 mg Oral Daily   Continuous Infusions:  sodium chloride 75 mL/hr at 08/28/22 1000   magnesium sulfate bolus IVPB       LOS: 8 days     Phillips Climes, MD [facility] 08/28/2022, 1:00 PM   Available via Epic secure chat 7am-7pm After these hours, please refer to coverage provider listed on [email or web address]

## 2022-08-28 NOTE — Anesthesia Postprocedure Evaluation (Signed)
Anesthesia Post Note  Patient: Ariel Arnold  Procedure(s) Performed: LEFT BELOW KNEE AMPUTATION (Left: Knee) APPLICATION OF WOUND VAC (Left: Leg Lower)     Patient location during evaluation: PACU Anesthesia Type: Regional Level of consciousness: awake and alert Pain management: pain level controlled Vital Signs Assessment: post-procedure vital signs reviewed and stable Respiratory status: spontaneous breathing, nonlabored ventilation, respiratory function stable and patient connected to nasal cannula oxygen Cardiovascular status: stable and blood pressure returned to baseline Postop Assessment: no apparent nausea or vomiting Anesthetic complications: no   No notable events documented.  Last Vitals:  Vitals:   08/28/22 0837 08/28/22 1236  BP: (!) 115/49 (!) 106/48  Pulse: 75 69  Resp: 18 18  Temp: 37 C 36.7 C  SpO2: 94% 94%    Last Pain:  Vitals:   08/28/22 1236  TempSrc: Oral  PainSc:                  [name]

## 2022-08-29 ENCOUNTER — Encounter (HOSPITAL_COMMUNITY): Payer: Self-pay | Admitting: Orthopedic Surgery

## 2022-08-29 DIAGNOSIS — M86672 Other chronic osteomyelitis, left ankle and foot: Secondary | ICD-10-CM | POA: Diagnosis not present

## 2022-08-29 DIAGNOSIS — E7220 Disorder of urea cycle metabolism, unspecified: Secondary | ICD-10-CM | POA: Diagnosis not present

## 2022-08-29 DIAGNOSIS — I10 Essential (primary) hypertension: Secondary | ICD-10-CM

## 2022-08-29 LAB — GLUCOSE, CAPILLARY
Glucose-Capillary: 145 mg/dL — ABNORMAL HIGH (ref 70–99)
Glucose-Capillary: 190 mg/dL — ABNORMAL HIGH (ref 70–99)
Glucose-Capillary: 128 mg/dL — ABNORMAL HIGH (ref 70–99)
Glucose-Capillary: 123 mg/dL — ABNORMAL HIGH (ref 70–99)

## 2022-08-29 LAB — AMMONIA: Ammonia: 57 umol/L — ABNORMAL HIGH (ref 9–35)

## 2022-08-29 LAB — SURGICAL PATHOLOGY

## 2022-08-29 MED ORDER — LACTULOSE 10 GM/15ML PO SOLN
30.0000 g | Freq: Three times a day (TID) | ORAL | Status: DC
  Administered 2022-08-29 (×2): 30 g via ORAL
  Filled 2022-08-29 (×2): qty 60

## 2022-08-29 MED ORDER — LACTULOSE 10 GM/15ML PO SOLN: 20.0000 g | Freq: Two times a day (BID) | ORAL | Status: DC

## 2022-08-29 NOTE — Progress Notes (Signed)
PROGRESS NOTE    Ariel Arnold  LTJ:[id number] DOB: [birth year] DOA: 08/19/2022 PCP: Jacqualine Code, DO     Brief Narrative:   Ariel Arnold is a 66 year old female with DM2, HTN, NASH cirrhosis with prior hepatic encephalopathy, HTN, previous MRSA foot wound infection, status post transmetatarsal amputation in April 2023 due to osteomyelitis, came into the hospital for worsening left foot pain, redness, and discharge at home.  X-rays in the ED showed concern for acute osteomyelitis. Podiatry and orthopedic surgery consulted, recommended evaluation by Dr. Sharol Given for BKA.  Dr. Sharol Given planning for surgical intervention 9/8  Subjective:  No significant events overnight, she denies any complaints today  Assessment & Plan:   Principal Problem:   Chronic osteomyelitis involving ankle and foot, left (HCC) Active Problems:   Liver cirrhosis secondary to NASH (HCC)   Thrombocytopenia (HCC)   Coronary atherosclerosis   Generalized anxiety disorder   Mixed hyperlipidemia   Type 2 diabetes mellitus treated with insulin (HCC)   Essential hypertension   Lactic acidosis   Diabetic osteomyelitis ([place])   Left lower extremity osteomyelitis -Initially on IV vancomycin and cefepime, discontinue 24 hours after left BKA and source control.  . -Seen by orthopedic, Dr. Sharol Given, status post r left transtibial amputation 9/8 -Continue with as needed pain meds -Seen by PT/OT, will need subacute rehab  NASH cirrhosis with pancytopenia -Stable - ammonia level is elevated at 57, will start on lactulose  Hypertension -Coreg  Chronic diastolic heart failure -Without exacerbation  Hyperlipidemia -Pravachol  Diabetes mellitus type 2 -Semglee, sliding scale insulin  Thrombocytopenia -Chronic, at baseline, continue to monitor closely as well DVT prophylaxis -Likely due to [facility] cirrhosis  DVT prophylaxis:  enoxaparin (LOVENOX) injection 30 mg Start: 08/28/22 1000 SCD's Start: 08/26/22  1242  Code Status: Full code Family Communication: No family at bedside Disposition Plan: SNF Status is: Inpatient   Consultants:  Podiatry ID Orthopedic surgery   Antimicrobials:  Anti-infectives (From admission, onward)    Start     Dose/Rate Route Frequency Ordered Stop   08/26/22 1330  ceFAZolin (ANCEF) IVPB 2g/100 mL premix  Status:  Discontinued        2 g 200 mL/hr over 30 Minutes Intravenous Every 8 hours 08/26/22 1241 08/26/22 1300   08/26/22 0745  ceFAZolin (ANCEF) IVPB 2g/100 mL premix        2 g 200 mL/hr over 30 Minutes Intravenous On call to O.R. 08/26/22 0656 08/26/22 1102   08/21/22 0100  vancomycin (VANCOREADY) IVPB 1500 mg/300 mL        1,500 mg 150 mL/hr over 120 Minutes Intravenous Every 24 hours 08/20/22 0050 08/27/22 0343   08/20/22 0800  ceFEPIme (MAXIPIME) 2 g in sodium chloride 0.9 % 100 mL IVPB  Status:  Discontinued        2 g 200 mL/hr over 30 Minutes Intravenous Every 8 hours 08/20/22 0033 08/24/22 1205   08/20/22 0015  vancomycin (VANCOREADY) IVPB 1500 mg/300 mL        1,500 mg 150 mL/hr over 120 Minutes Intravenous  Once 08/20/22 0012 08/20/22 0300   08/19/22 2345  vancomycin (VANCOCIN) IVPB 1000 mg/200 mL premix  Status:  Discontinued        1,000 mg 200 mL/hr over 60 Minutes Intravenous  Once 08/19/22 2334 08/20/22 0012   08/19/22 2345  ceFEPIme (MAXIPIME) 2 g in sodium chloride 0.9 % 100 mL IVPB        2 g 200 mL/hr over 30 Minutes Intravenous  Once 08/19/22 2334 08/20/22 0030        Objective: Vitals:   08/28/22 1925 08/28/22 2304 08/29/22 0800 08/29/22 1230  BP: (!) 117/50 (!) 118/49 (!) 124/57 (!) 110/54  Pulse: 68 79 78 69  Resp: 20 19 20 19   Temp: 98.3 F (36.8 C) (!) 97.5 F (36.4 C) 98 F (36.7 C) 98.1 F (36.7 C)  TempSrc: Oral Temporal Oral Oral  SpO2: 98% 92% 96% 93%  Weight:      Height:        Intake/Output Summary (Last 24 hours) at 08/29/2022 1244 Last data filed at 08/29/2022 1200 Gross per 24 hour  Intake  1949.88 ml  Output 1000 ml  Net 949.88 ml    Filed Weights   08/19/22 2103 08/21/22 0550  Weight: 79.8 kg 86.7 kg    Examination:   Awake Alert, Oriented X 3, frail, deconditioned Symmetrical Chest wall movement, Good air movement bilaterally, CTAB RRR,No Gallops,Rubs or new Murmurs, No Parasternal Heave +ve B.Sounds, Abd Soft, No tenderness, No rebound - guarding or rigidity. Left Lower extremity status post BKA, with shrinker, VAC canister with good suction, no drainage in the VAC canister -   Data Reviewed: I have personally reviewed following labs and imaging studies  CBC: Recent Labs  Lab 08/24/22 0441 08/27/22 1106 08/28/22 0230  WBC 4.1 4.0 4.0  HGB 9.6* 8.5* 8.5*  HCT 29.7* 25.7* 26.2*  MCV 97.7 95.9 96.3  PLT 90* 62* 58*   Basic Metabolic Panel: Recent Labs  Lab 08/24/22 0441 08/27/22 1106 08/28/22 0230  NA 136 135 135  K 3.8 4.3 4.2  CL 108 113* 110  CO2 25 21* 22  GLUCOSE 199* 183* 128*  BUN 22 27* 31*  CREATININE 0.76 0.79 0.82  CALCIUM 8.5* 7.7* 8.3*  MG 1.8  --   --    GFR: Estimated Creatinine Clearance: 74.2 mL/min (by C-G formula based on SCr of 0.82 mg/dL). Liver Function Tests: No results for input(s): "AST", "ALT", "ALKPHOS", "BILITOT", "PROT", "ALBUMIN" in the last 168 hours.  No results for input(s): "LIPASE", "AMYLASE" in the last 168 hours. No results for input(s): "AMMONIA" in the last 168 hours. Coagulation Profile: No results for input(s): "INR", "PROTIME" in the last 168 hours. Cardiac Enzymes: No results for input(s): "CKTOTAL", "CKMB", "CKMBINDEX", "TROPONINI" in the last 168 hours. BNP (last 3 results) No results for input(s): "PROBNP" in the last 8760 hours. HbA1C: No results for input(s): "HGBA1C" in the last 72 hours. CBG: Recent Labs  Lab 08/28/22 1152 08/28/22 1614 08/28/22 2133 08/29/22 0759 08/29/22 1232  GLUCAP 154* 128* 124* 145* 190*   Lipid Profile: No results for input(s): "CHOL", "HDL", "LDLCALC",  "TRIG", "CHOLHDL", "LDLDIRECT" in the last 72 hours. Thyroid Function Tests: No results for input(s): "TSH", "T4TOTAL", "FREET4", "T3FREE", "THYROIDAB" in the last 72 hours. Anemia Panel: No results for input(s): "VITAMINB12", "FOLATE", "FERRITIN", "TIBC", "IRON", "RETICCTPCT" in the last 72 hours. Sepsis Labs: No results for input(s): "PROCALCITON", "LATICACIDVEN" in the last 168 hours.   Recent Results (from the past 240 hour(s))  Culture, blood (Routine X 2) w Reflex to ID Panel     Status: None   Collection Time: 08/20/22 11:47 AM   Specimen: BLOOD  Result Value Ref Range Status   Specimen Description   Final    BLOOD LEFT ANTECUBITAL Performed at [facility] [address] [place], [place] [address]    Special Requests   Final    BOTTLES DRAWN AEROBIC AND ANAEROBIC  Blood Culture results may not be optimal due to an inadequate volume of blood received in culture bottles Performed at [facility] [address], [place], [place] [address]    Culture   Final    NO GROWTH 5 DAYS Performed at [facility], [place] [address], [place], [place] [address]    Report Status 08/25/2022 FINAL  Final  Culture, blood (Routine X 2) w Reflex to ID Panel     Status: None   Collection Time: 08/20/22 11:47 AM   Specimen: BLOOD  Result Value Ref Range Status   Specimen Description   Final    BLOOD LEFT ANTECUBITAL Performed at [facility] [address], [place], [place] [address]    Special Requests   Final    BOTTLES DRAWN AEROBIC AND ANAEROBIC Blood Culture results may not be optimal due to an inadequate volume of blood received in culture bottles Performed at [facility] [address] [place], [place] [address]    Culture   Final    NO GROWTH 5 DAYS Performed at [facility], [place] [address] [place], [place] [address]    Report Status 08/25/2022 FINAL  Final  Surgical pcr screen     Status:  Abnormal   Collection Time: 08/20/22  8:00 PM   Specimen: Nasal Mucosa; Nasal Swab  Result Value Ref Range Status   MRSA, PCR POSITIVE (A) NEGATIVE Final    Comment: CRITICAL RESULT CALLED TO, READ BACK BY AND VERIFIED WITH: Redmond School, RN 2145 MH    Staphylococcus aureus POSITIVE (A) NEGATIVE Final    Comment: (NOTE) The Xpert SA Assay (FDA approved for NASAL specimens in patients 93 years of age and older), is one component of a comprehensive surveillance program. It is not intended to diagnose infection nor to guide or monitor treatment. Performed at [facility], [place] [address], [place], [place] [address]       Radiology Studies: No results found.    Scheduled Meds:  vitamin C  1,000 mg Oral Daily   carvedilol  6.25 mg Oral BID   docusate sodium  100 mg Oral Daily   enoxaparin (LOVENOX) injection  30 mg Subcutaneous Q24H   fentaNYL (SUBLIMAZE) injection  50 mcg Intravenous Once   ferrous sulfate  325 mg Oral BID WC   folic acid  1 mg Oral BID   insulin aspart  0-20 Units Subcutaneous TID WC   insulin aspart  0-5 Units Subcutaneous QHS   insulin glargine-yfgn  20 Units Subcutaneous QHS   leptospermum manuka honey  1 Application Topical Daily   nutrition supplement (JUVEN)  1 packet Oral BID BM   pantoprazole  40 mg Oral Daily   pravastatin  20 mg Oral QHS   sertraline  150 mg Oral Daily   zinc sulfate  220 mg Oral Daily   Continuous Infusions:  sodium chloride 75 mL/hr at 08/29/22 0212   magnesium sulfate bolus IVPB       LOS: 9 days     Phillips Climes, MD [facility] 08/29/2022, 12:44 PM   Available via Epic secure chat 7am-7pm After these hours, please refer to coverage provider listed on [email or web address]

## 2022-08-29 NOTE — TOC Progression Note (Addendum)
Transition of Care [facility]) - Progression Note    Patient Details  Name: Ariel Arnold MRN: [id number] Date of Birth: 12/10/1956  Transition of Care [facility]) CM/SW [place], LCSW Phone Number: 08/29/2022, 9:59 AM  Clinical Narrative:    CSW received consult for possible SNF placement at time of discharge. CSW spoke with patient's daughter as patient still documented as confused. She reported that she is hopeful patient's mentation will clear to make her own decisions. She expressed understanding of PT recommendation and is agreeable to SNF placement at time of discharge if patient is ok with that. CSW discussed insurance authorization process and will provide Medicare SNF ratings list. CSW will send out referrals for review and provide bed offers as available for [place]. Insurance authorization started pending facility choice, Ref# [id number].  Skilled Nursing Rehab Facilities-   [email or web address]   Ratings out of 5 stars (the highest)   Name Address  Phone # [place] Inspection Overall  [facility] [address] [place] 5 5 2 4   [facility]  [address], [place] [phone] 4 2 5 5   [facility] [place], [place] 1 1 1 1   [place], [place] 2 2 4 4   [facility] [address] [place] 1 1 2 1   [place] [address], [place] [phone] 3 2 4 4   [facility] [address], [place] 5 1 3 3   [facility] [address] [place] 5 2 3 4   [address] ([place]) [place], [place] [phone] 4 1 2 1   [facility] [phone] [address], [place] [phone] 4 1 1 1   [facility] [address] [facility] [phone] 3 1 2 1   [facility] ([place]) [place]. [place], [place] [phone] 4  1 1 1   [facility] [address] [place] [phone] 4 2 4 4           [place] 4 1 3 2   [facility]  [address] [place] 3 1 5 4   [place], [place] [phone] 1 1 1 1   [facility] [address], [employer] [phone] 2 2 3 3           [address] (no [facility]) [address], [place] [phone] 5 5 5 5   [facility] (No Humana) [address], [place] 4 1 4 3   [facility] (No UHC) [place], [place] 5 5 5 5   [facility] [address], [employer] [phone] 2 3 5 5   [facility] [address] [place] 1 1 2 1   [facility] [address] [place] [phone] 3 1 1 1   [place], [place] 5 2 4 5   [facility]  [address] [place] 2 2 1 1   [facility] [address], [place] 3 2 1 1   [facility] [place], [place] 2 2 2 2           [facility] [address], [place] 1 1 1 1   [place] [address], [place]  [phone] 2 4 2 2   [facility] [place] [address], [place] [phone]  4 2 3 3   [facility] [address], [place] 2 1 1 1   [place] (No Humana) [address], [place] [phone] 2 2 3 3   [facility] [address] [place] [phone] 2 1 1 1           [facility] [place], [place] 5 4 5 5   [facility])  [address], [place] 2 1 2 1   [facility]) [place] [address] [place] [phone] 3 1 4 3   [facility] [address] [place] 3 3 4 4   [address] [place], [place] 2 3 1 1   [facility]) [address] [place] [phone] 2 1 4 3       Expected Discharge Plan: [place] Barriers to Discharge: Insurance Authorization, Continued Medical Work up, SNF Pending bed offer  Expected Discharge Plan and Services Expected Discharge Plan: [place] In-house Referral: Clinical Social Work   Post Acute Care Choice: [place] Living arrangements for the past 2 months: Single Family Home                                       Social Determinants of Health (SDOH) Interventions    Readmission Risk Interventions    11/16/2021    9:27 AM  Readmission Risk Prevention Plan  Home Care Screening Complete  Medication Review (RN CM) Complete

## 2022-08-29 NOTE — Progress Notes (Signed)
Physical Therapy Treatment Patient Details Name: Ariel Arnold MRN: [id number] DOB: 03/29/1956 Today's Date: 08/29/2022   History of Present Illness 66 y.o. female admitted 9/1 with left transmet infection and dehiscence s/p Lt BKA 9/8. PMhx: Lt TMA, HTN, T2DM, PVD, Nash cirrhosis, chronic pancytopenia, HLD, depression    PT Comments    Pt received in supine, lethargic but responding to cues and more alert once sitting EOB. Pt modA to roll off of bed pan to her L side. Pt with improved command following to perform bed mobility this date compared with previous session and able to perform transfer to/from edge of bed with mod to maxA +2 for safety. Pt attempted to perform seated lateral scooting toward [facility] but with poor propulsion ultimately needing totalA +2 to perform with bed pad assist. Pt +2 maxA for posterior supine scooting toward HOB with bed in trendelenburg and pt using overhead rails and RLE to assist.    Recommendations for follow up therapy are one component of a multi-disciplinary discharge planning process, led by the attending physician.  Recommendations may be updated based on patient status, additional functional criteria and insurance authorization.  Follow Up Recommendations  Skilled nursing-short term rehab (<3 hours/day) Can patient physically be transported by private vehicle: No   Assistance Recommended at Discharge Frequent or constant Supervision/Assistance  Patient can return home with the following Two people to help with walking and/or transfers;A lot of help with bathing/dressing/bathroom;Assistance with cooking/housework;Direct supervision/assist for medications management;Direct supervision/assist for financial management;Assist for transportation;Help with stairs or ramp for entrance   Equipment Recommendations  Other (comment) (defer to post-acute rehab)    Recommendations for Other Services       Precautions / Restrictions Precautions Precautions:  Fall Precaution Comments: L limb protector Restrictions Weight Bearing Restrictions: Yes LLE Weight Bearing: Non weight bearing     Mobility  Bed Mobility Overal bed mobility: Needs Assistance Bed Mobility: Rolling, Sidelying to Sit Rolling: Mod assist, +2 for safety/equipment Sidelying to sit: Max assist, +2 for safety/equipment, HOB elevated   Sit to supine: Total assist, +2 for physical assistance   General bed mobility comments: cues for log roll sequencing; to her L side; increased assist to return to supine 2/2 fatigue level    Transfers Overall transfer level: Needs assistance                Lateral/Scoot Transfers: Total assist, +2 physical assistance General transfer comment: max cues for sequencing/technique with x3 attempts but pt unable to significantly assist, pt needing +2 totalA with bed pads to scoot with poor propulsion achieved each attempt.    Ambulation/Gait                   Stairs             Wheelchair Mobility    Modified Rankin (Stroke Patients Only)       Balance Overall balance assessment: Needs assistance Sitting-balance support: Feet supported, Bilateral upper extremity supported Sitting balance-Leahy Scale: Fair Sitting balance - Comments: minA-min guard for sitting balance       Standing balance comment: pt unable                            Cognition Arousal/Alertness: Lethargic Behavior During Therapy: Flat affect Overall Cognitive Status: No family/caregiver present to determine baseline cognitive functioning        General Comments: Difficult to fully assess due to lethargy, pt frequently closing eyes  but will open briefly to cues. Pt more alert once seated EOB but keeping eyes closed again once back to supine. Slow processing throughout, slightly improved command following for bed mobility but unable to follow instructions well for seated scooting.        Exercises Other Exercises Other  Exercises: supine RLE AROM: ankle pumps, heel slides, hip abduction; seated RLE AROM: LAQ x10 reps ea    General Comments General comments (skin integrity, edema, etc.): BP 124/52 (71) supine, HR 72, SpO2 95% on RA; BP 114/48 (68) seated EOB, no dizziness reported but pt c/o fatigue. Tolerated sitting ~10 mins with seated RLE exercises and scooting attempts.      Pertinent Vitals/Pain Pain Assessment Pain Assessment: Faces Faces Pain Scale: Hurts even more Pain Location: L residual limb at area of limb guard proximal attachment Pain Descriptors / Indicators: Guarding, Grimacing, Cramping Pain Intervention(s): Monitored during session, Limited activity within patient's tolerance, Repositioned, Other (comment), Patient requesting pain meds-RN notified ((limb guard doffed to give skin a chance to rest, pt able to maintain L knee extended once doffed and resting))           PT Goals (current goals can now be found in the care plan section) Acute Rehab PT Goals Patient Stated Goal: did not state PT Goal Formulation: Patient unable to participate in goal setting Time For Goal Achievement: 09/10/22 Progress towards PT goals: Progressing toward goals    Frequency    Min 3X/week      PT Plan Current plan remains appropriate       AM-PAC PT "6 Clicks" Mobility   Outcome Measure  Help needed turning from your back to your side while in a flat bed without using bedrails?: A Lot Help needed moving from lying on your back to sitting on the side of a flat bed without using bedrails?: A Lot Help needed moving to and from a bed to a chair (including a wheelchair)?: Total Help needed standing up from a chair using your arms (e.g., wheelchair or bedside chair)?: Total Help needed to walk in hospital room?: Total Help needed climbing 3-5 steps with a railing? : Total 6 Click Score: 8    End of Session Equipment Utilized During Treatment: Gait belt;Other (comment) (L limb guard) Activity  Tolerance: Patient tolerated treatment well;Patient limited by fatigue Patient left: in bed;with call bell/phone within reach;with bed alarm set;Other (comment) (L limb guard doffed while resting due to pain and chafing at proximal strap area; pt able to hold LLE extended while resting; will need to be re-donned later in day.) Nurse Communication: Mobility status;Need for lift equipment;Other (comment) (L limb guard temporarily doffed at rest for pt comfort) PT Visit Diagnosis: Muscle weakness (generalized) (M62.81);Other abnormalities of gait and mobility (R26.89)     Time: 6579-0383 PT Time Calculation (min) (ACUTE ONLY): 32 min  Charges:  $Therapeutic Exercise: 8-22 mins $Therapeutic Activity: 8-22 mins                     Euna Armon P., PTA Acute Rehabilitation Services Secure Chat Preferred 9a-5:30pm Office: [place] 08/29/2022, 5:48 PM

## 2022-08-29 NOTE — Progress Notes (Addendum)
PTA Comments: Pt received in supine, lethargic but agreeable to therapy session with good participation. Pt with improved technique to roll to L/R sides today for peri-care and bed pad change (modA to roll with use of bed features).  Pt limited due to reported bowel urgency and requesting to get on bed pan. RN notified pt will need assist/to be checked on but RN and NT busy in another room so PTA plan to check back to assist pt off bed pan after short time for pt safety/skin protection. Pt continues to benefit from PT services to progress toward functional mobility goals.   08/29/22 1427  PT Visit Information  Last PT Received On 08/29/22  Assistance Needed +2  History of Present Illness 66 y.o. female admitted 9/1 with left transmet infection and dehiscence s/p Lt BKA 9/8. PMhx: Lt TMA, HTN, T2DM, PVD, Nash cirrhosis, chronic pancytopenia, HLD, depression  Subjective Data  Patient Stated Goal not stated  Precautions  Precautions Fall  Precaution Comments L limb protector  Restrictions  Weight Bearing Restrictions Yes  LLE Weight Bearing NWB  Pain Assessment  Pain Assessment Faces  Faces Pain Scale 6  Pain Location L residual limb  Pain Descriptors / Indicators Guarding;Grimacing  Pain Intervention(s) Monitored during session;Limited activity within patient's tolerance;Repositioned;Patient requesting pain meds-RN notified  Cognition  Arousal/Alertness Lethargic  Behavior During Therapy Flat affect  Overall Cognitive Status No family/caregiver present to determine baseline cognitive functioning  General Comments Difficult to fully assess due to lethargy. Slow processing but following multimodal cues to assist with rolling. Keeps eyes closed majority of session, pt perseverating on need to use bed pan, requesting increased time to remain on bed pan at end of session; RN occupied in another room so PTA plan to return later to check on pt as she may not be able to indicate when she needs to be  off bed pan.  Bed Mobility  Overal bed mobility Needs Assistance  Bed Mobility Rolling  Rolling Mod assist;+2 for safety/equipment  General bed mobility comments Rolling x6 reps ea direction for peri-care and bed pad changes, pt needing multimodal cues to initiate but able to effectively assist with cross-body reaching and RLE bent with foot pushing into bed for improved hip extension rolling to her L side. Pt requesting to get on bed pan after peri-care and bed pad changes, so rolled one final time to her L/R and bed pan inserted.  Transfers  General transfer comment deferred  Balance  Sitting balance - Comments defer -pt with bowel/bladder urgency requesting to get on bed pan  General Comments  General comments (skin integrity, edema, etc.) BP soft but stable in supine  PT - End of Session  Equipment Utilized During Treatment Other (comment) (L limb guard)  Activity Tolerance Patient limited by lethargy  Patient left in bed;with call bell/phone within reach;with bed alarm set;Other (comment) (on bed pan)  Nurse Communication Mobility status;Other (comment) (pt on bed pan)   PT - Assessment/Plan  PT Plan Current plan remains appropriate  PT Visit Diagnosis Muscle weakness (generalized) (M62.81);Other abnormalities of gait and mobility (R26.89)  PT Frequency (ACUTE ONLY) Min 3X/week  Follow Up Recommendations Skilled nursing-short term rehab (<3 hours/day)  Can patient physically be transported by private vehicle No  Assistance recommended at discharge Frequent or constant Supervision/Assistance  Patient can return home with the following Two people to help with walking and/or transfers;A lot of help with bathing/dressing/bathroom;Assistance with cooking/housework;Direct supervision/assist for medications management;Direct supervision/assist for financial management;Assist for transportation;Help  with stairs or ramp for entrance  PT equipment Other (comment) (TBD)  AM-PAC PT "6 Clicks"  Mobility Outcome Measure (Version 2)  Help needed turning from your back to your side while in a flat bed without using bedrails? 2  Help needed moving from lying on your back to sitting on the side of a flat bed without using bedrails? 2  Help needed moving to and from a bed to a chair (including a wheelchair)? 1  Help needed standing up from a chair using your arms (e.g., wheelchair or bedside chair)? 1  Help needed to walk in hospital room? 1  Help needed climbing 3-5 steps with a railing?  1  6 Click Score 8  Consider Recommendation of Discharge To: CIR/SNF/LTACH  Progressive Mobility  What is the highest level of mobility based on the progressive mobility assessment? Level 2 ([place]) - Balance while sitting on edge of bed and cannot stand  Activity Turned to right side;Turned to left side;Turned to back - supine;Moved into chair position in bed  PT Goal Progression  Progress towards PT goals Progressing toward goals  Acute Rehab PT Goals  PT Goal Formulation Patient unable to participate in goal setting  Time For Goal Achievement 09/10/22  PT Time Calculation  PT Start Time (ACUTE ONLY) 1417  PT Stop Time (ACUTE ONLY) 1427  PT Time Calculation (min) (ACUTE ONLY) 10 min  PT General Charges  $$ ACUTE PT VISIT 1 Visit  PT Treatments  $Therapeutic Activity 8-22 mins

## 2022-08-29 NOTE — NC FL2 (Signed)
[place] LEVEL OF CARE SCREENING TOOL     IDENTIFICATION  Patient Name: Ariel Arnold Birthdate: 11-12-1956 Sex: female Admission Date (Current Location): 08/19/2022  [facility] and [place] Number:   ([place])   Facility and Address:  [facility] [place] [address] [place], [place] [address]      Provider Number: [id number]  Attending Physician Name and Address:  Elgergawy, Silver Huguenin, MD  Relative Name and Phone Number:       Current Level of Care: Hospital Recommended Level of Care: [place] Prior Approval Number:    Date Approved/Denied:   PASRR Number:    Discharge Plan: SNF    Current Diagnoses: Patient Active Problem List   Diagnosis Date Noted   Diabetic osteomyelitis ([place])    MRSA bacteremia 06/06/2022   Secondary esophageal varices with bleeding ([place])    Cellulitis of left foot 06/02/2022   Ascites 04/05/2022   Cellulitis 04/02/2022   Lactic acidosis 04/02/2022   Chronic osteomyelitis involving ankle and foot, left ([place]) 11/09/2021   Essential hypertension 11/09/2021   Hematemesis    Liver cirrhosis secondary to NASH ([place]) 05/19/2021   Thrombocytopenia ([place]) 05/19/2021   Obesity (BMI 30-39.9) 02/15/2021   Acquired absence of left foot ([place]) 01/11/2021   Non-healing wound of amputation stump ([place]) 09/09/2020   Coronary atherosclerosis 08/04/2020   Non-prs chronic ulcer oth prt right foot w fat layer exposed ([place]) 12/24/2019   Acquired absence of other left toe(s) ([place]) 11/19/2019   Stage 2 chronic kidney disease due to type 2 diabetes mellitus ([place]) 25/85/2778   Nonalcoholic fatty liver disease 05/07/2019   Type 2 diabetes mellitus treated with insulin ([place]) 05/06/2019   Depressive disorder 04/18/2019   Generalized anxiety disorder 04/18/2019   Seasonal allergic rhinitis 04/18/2019   Arthritis 04/18/2019   Mixed hyperlipidemia 12/19/2018    Orientation RESPIRATION BLADDER Height & Weight     Self,  Time  Normal Continent, External catheter Weight: 191 lb 3.2 oz (86.7 kg) Height:  5' 7"  (170.2 cm)  BEHAVIORAL SYMPTOMS/MOOD NEUROLOGICAL BOWEL NUTRITION STATUS      Continent Diet (See dc summary)  AMBULATORY STATUS COMMUNICATION OF NEEDS Skin   Extensive Assist Verbally Surgical wounds, Wound Vac (Closed incision on leg; prevena wound vac will come with pt)                       Personal Care Assistance Level of Assistance  Bathing, Feeding, Dressing Bathing Assistance: Limited assistance Feeding assistance: Limited assistance Dressing Assistance: Limited assistance     Functional Limitations Info             [place]  PT (By licensed PT), OT (By licensed OT)     PT Frequency: 5x/week OT Frequency: 5x/week            Contractures Contractures Info: Not present    Additional Factors Info  Code Status, Allergies, Isolation Precautions, Psychotropic, Insulin Sliding Scale Code Status Info: Full Allergies Info: Chlorhexidine, Tape Psychotropic Info: Zoloft Insulin Sliding Scale Info: see dc summary Isolation Precautions Info: MRSA     Current Medications (08/29/2022):  This is the current hospital active medication list Current Facility-Administered Medications  Medication Dose Route Frequency Provider Last Rate Last Admin   0.9 %  sodium chloride infusion   Intravenous Continuous Newt Minion, MD 75 mL/hr at 08/29/22 0212 New Bag at 08/29/22 0212   acetaminophen (TYLENOL) tablet 325-650 mg  325-650  mg Oral Q6H PRN Newt Minion, MD       albuterol (PROVENTIL) (2.5 MG/3ML) 0.083% nebulizer solution 2.5 mg  2.5 mg Inhalation Q4H PRN Newt Minion, MD       alum & mag hydroxide-simeth (MAALOX/MYLANTA) 200-200-20 MG/5ML suspension 15-30 mL  15-30 mL Oral Q2H PRN Newt Minion, MD       ascorbic acid (VITAMIN C) tablet 1,000 mg  1,000 mg Oral Daily Newt Minion, MD   1,000 mg at 08/29/22 0934   bisacodyl (DULCOLAX) EC tablet 5 mg  5 mg  Oral Daily PRN Newt Minion, MD       carvedilol (COREG) tablet 6.25 mg  6.25 mg Oral BID Newt Minion, MD   6.25 mg at 08/29/22 0934   docusate sodium (COLACE) capsule 100 mg  100 mg Oral Daily Newt Minion, MD   100 mg at 08/29/22 0934   enoxaparin (LOVENOX) injection 30 mg  30 mg Subcutaneous Q24H Elgergawy, Silver Huguenin, MD   30 mg at 08/29/22 0934   fentaNYL (SUBLIMAZE) injection 50 mcg  50 mcg Intravenous Once Newt Minion, MD       ferrous sulfate tablet 325 mg  325 mg Oral BID WC Newt Minion, MD   325 mg at 40/81/44 8185   folic acid (FOLVITE) tablet 1 mg  1 mg Oral BID Newt Minion, MD   1 mg at 08/29/22 0934   gabapentin (NEURONTIN) capsule 600 mg  600 mg Oral TID PRN Newt Minion, MD   600 mg at 08/28/22 1920   guaiFENesin-dextromethorphan (ROBITUSSIN DM) 100-10 MG/5ML syrup 15 mL  15 mL Oral Q4H PRN Newt Minion, MD       hydrALAZINE (APRESOLINE) injection 5 mg  5 mg Intravenous Q20 Min PRN Newt Minion, MD       HYDROmorphone (DILAUDID) injection 0.5-1 mg  0.5-1 mg Intravenous Q4H PRN Newt Minion, MD   1 mg at 08/27/22 1346   insulin aspart (novoLOG) injection 0-20 Units  0-20 Units Subcutaneous TID WC Newt Minion, MD   3 Units at 08/29/22 0802   insulin aspart (novoLOG) injection 0-5 Units  0-5 Units Subcutaneous QHS Newt Minion, MD   2 Units at 08/20/22 2134   insulin glargine-yfgn (SEMGLEE) injection 20 Units  20 Units Subcutaneous QHS Newt Minion, MD   20 Units at 08/28/22 2134   labetalol (NORMODYNE) injection 10 mg  10 mg Intravenous Q10 min PRN Newt Minion, MD       leptospermum manuka honey (MEDIHONEY) paste 1 Application  1 Application Topical Daily Newt Minion, MD   1 Application at 63/14/97 0935   magnesium citrate solution 1 Bottle  1 Bottle Oral Once PRN Newt Minion, MD       magnesium sulfate IVPB 2 g 50 mL  2 g Intravenous Daily PRN Newt Minion, MD       metoprolol tartrate (LOPRESSOR) injection 2-5 mg  2-5 mg Intravenous Q2H PRN  Newt Minion, MD       morphine (PF) 2 MG/ML injection 2 mg  2 mg Intravenous Q2H PRN Newt Minion, MD   2 mg at 08/28/22 0408   nutrition supplement (JUVEN) (JUVEN) powder packet 1 packet  1 packet Oral BID BM Newt Minion, MD   1 packet at 08/29/22 0934   ondansetron (ZOFRAN) injection 4 mg  4 mg Intravenous Q6H PRN Newt Minion, MD  4 mg at 08/27/22 0954   ondansetron (ZOFRAN) tablet 4 mg  4 mg Oral Q6H PRN Newt Minion, MD       oxyCODONE (Oxy IR/ROXICODONE) immediate release tablet 10-15 mg  10-15 mg Oral Q4H PRN Newt Minion, MD   15 mg at 08/29/22 0800   oxyCODONE (Oxy IR/ROXICODONE) immediate release tablet 5-10 mg  5-10 mg Oral Q4H PRN Newt Minion, MD   10 mg at 08/28/22 0408   pantoprazole (PROTONIX) EC tablet 40 mg  40 mg Oral Daily Newt Minion, MD   40 mg at 08/29/22 0934   phenol (CHLORASEPTIC) mouth spray 1 spray  1 spray Mouth/Throat PRN Newt Minion, MD       polyethylene glycol (MIRALAX / GLYCOLAX) packet 17 g  17 g Oral Daily PRN Newt Minion, MD       potassium chloride SA (KLOR-CON M) CR tablet 20-40 mEq  20-40 mEq Oral Daily PRN Newt Minion, MD       pravastatin (PRAVACHOL) tablet 20 mg  20 mg Oral QHS Newt Minion, MD   20 mg at 08/28/22 2129   sertraline (ZOLOFT) tablet 150 mg  150 mg Oral Daily Newt Minion, MD   150 mg at 08/29/22 1438   zinc sulfate capsule 220 mg  220 mg Oral Daily Newt Minion, MD   220 mg at 08/29/22 8875     Discharge Medications: Please see discharge summary for a list of discharge medications.  Relevant Imaging Results:  Relevant Lab Results:   Additional Information SSN [id number]. [place].  Benard [place], LCSW

## 2022-08-30 DIAGNOSIS — E7220 Disorder of urea cycle metabolism, unspecified: Secondary | ICD-10-CM | POA: Diagnosis not present

## 2022-08-30 DIAGNOSIS — E1169 Type 2 diabetes mellitus with other specified complication: Secondary | ICD-10-CM | POA: Diagnosis not present

## 2022-08-30 DIAGNOSIS — K7581 Nonalcoholic steatohepatitis (NASH): Secondary | ICD-10-CM | POA: Diagnosis not present

## 2022-08-30 DIAGNOSIS — M86672 Other chronic osteomyelitis, left ankle and foot: Secondary | ICD-10-CM | POA: Diagnosis not present

## 2022-08-30 LAB — GLUCOSE, CAPILLARY
Glucose-Capillary: 118 mg/dL — ABNORMAL HIGH (ref 70–99)
Glucose-Capillary: 206 mg/dL — ABNORMAL HIGH (ref 70–99)
Glucose-Capillary: 185 mg/dL — ABNORMAL HIGH (ref 70–99)
Glucose-Capillary: 155 mg/dL — ABNORMAL HIGH (ref 70–99)

## 2022-08-30 LAB — BASIC METABOLIC PANEL
Anion gap: 3 — ABNORMAL LOW (ref 5–15)
BUN: 32 mg/dL — ABNORMAL HIGH (ref 8–23)
CO2: 22 mmol/L (ref 22–32)
Calcium: 8.1 mg/dL — ABNORMAL LOW (ref 8.9–10.3)
Chloride: 112 mmol/L — ABNORMAL HIGH (ref 98–111)
Creatinine, Ser: 0.82 mg/dL (ref 0.44–1.00)
GFR, Estimated: >60 mL/min (ref >60)
Glucose, Bld: 136 mg/dL — ABNORMAL HIGH (ref 70–99)
Potassium: 3.9 mmol/L (ref 3.5–5.1)
Sodium: 137 mmol/L (ref 135–145)

## 2022-08-30 LAB — CBC
HCT: 25.2 % — ABNORMAL LOW (ref 36.0–46.0)
Hemoglobin: 8.4 g/dL — ABNORMAL LOW (ref 12.0–15.0)
MCH: 32.2 pg (ref 26.0–34.0)
MCHC: 33.3 g/dL (ref 30.0–36.0)
MCV: 96.6 fL (ref 80.0–100.0)
Platelets: 64 10*3/uL — ABNORMAL LOW (ref 150–400)
RBC: 2.61 MIL/uL — ABNORMAL LOW (ref 3.87–5.11)
RDW: 15.3 % (ref 11.5–15.5)
WBC: 2.9 10*3/uL — ABNORMAL LOW (ref 4.0–10.5)
nRBC: 0 % (ref 0.0–0.2)

## 2022-08-30 LAB — AMMONIA: Ammonia: 105 umol/L — ABNORMAL HIGH (ref 9–35)

## 2022-08-30 MED ORDER — LACTULOSE 10 GM/15ML PO SOLN
30.0000 g | ORAL | Status: AC
  Administered 2022-08-30 (×5): 30 g via ORAL
  Filled 2022-08-30 (×5): qty 60

## 2022-08-30 MED ORDER — LACTULOSE 10 GM/15ML PO SOLN
30.0000 g | Freq: Three times a day (TID) | ORAL | Status: DC
  Filled 2022-08-30: qty 60

## 2022-08-30 NOTE — TOC Progression Note (Signed)
Transition of Care [facility]) - Progression Note    Patient Details  Name: Ariel Arnold MRN: [id number] Date of Birth: 10-18-1956  Transition of Care [facility]) CM/SW [place], [place] Work Phone Number: 08/30/2022, 4:06 PM  Clinical Narrative:    3:30pm - MSW intern and CSW spoke with patient at bedside regarding the discharge plan and the recommendation of patient possibly going to SNF. CSW presented [facility] in [place] and [facility] in [place]. Patient asked if she could possibly go down to [place] rehab.   4:00 pm- MSW intern spoke with patient at bedside again and let her know she only has [place], which would prevent her from being able to go to [place]. Patient stated she wanted to go back to [facility].    Expected Discharge Plan: [place] Barriers to Discharge: Continued Medical Work up, SNF Pending bed offer  Expected Discharge Plan and Services Expected Discharge Plan: [place] In-house Referral: Clinical Social Work   Post Acute Care Choice: [place] Living arrangements for the past 2 months: Single Family Home                                       Social Determinants of Health (SDOH) Interventions    Readmission Risk Interventions    11/16/2021    9:27 AM  Readmission Risk Prevention Plan  Home Care Screening Complete  Medication Review (RN CM) Complete

## 2022-08-30 NOTE — Plan of Care (Signed)
  Problem: Coping: Goal: Ability to adjust to condition or change in health will improve Outcome: Progressing   Problem: Fluid Volume: Goal: Ability to maintain a balanced intake and output will improve Outcome: Progressing   Problem: Nutritional: Goal: Maintenance of adequate nutrition will improve Outcome: Progressing   Problem: Skin Integrity: Goal: Risk for impaired skin integrity will decrease Outcome: Progressing   Problem: Activity: Goal: Risk for activity intolerance will decrease Outcome: Progressing   Problem: Nutrition: Goal: Adequate nutrition will be maintained Outcome: Progressing

## 2022-08-30 NOTE — Progress Notes (Signed)
PROGRESS NOTE    Ariel Arnold  WYO:[id number] DOB: [birth year] DOA: 08/19/2022 PCP: Jacqualine Code, DO     Brief Narrative:   Ariel Arnold is a 66 year old female with DM2, HTN, NASH cirrhosis with prior hepatic encephalopathy, HTN, previous MRSA foot wound infection, status post transmetatarsal amputation in April 2023 due to osteomyelitis, came into the hospital for worsening left foot pain, redness, and discharge at home.  X-rays in the ED showed concern for acute osteomyelitis. Podiatry and orthopedic surgery consulted, recommended evaluation by Dr. Sharol Given for BKA.  Dr. Sharol Given planning for surgical intervention 9/8  Subjective:  No significant events overnight, reports generalized weakness, fatigue, no BM for last 48 hours  Assessment & Plan:   Principal Problem:   Chronic osteomyelitis involving ankle and foot, left (HCC) Active Problems:   Liver cirrhosis secondary to NASH (HCC)   Thrombocytopenia (HCC)   Coronary atherosclerosis   Generalized anxiety disorder   Mixed hyperlipidemia   Type 2 diabetes mellitus treated with insulin (HCC)   Essential hypertension   Lactic acidosis   Diabetic osteomyelitis (HCC)   Left lower extremity osteomyelitis -Initially on IV vancomycin and cefepime, discontinue 24 hours after left BKA and source control.  . -Seen by orthopedic, Dr. Sharol Given, status post  left transtibial amputation 9/8 -Continue with as needed pain meds -Seen by PT/OT, will need subacute rehab  NASH cirrhosis with pancytopenia Hyperammonemia -LFTs has been stable -Ammonia level much worsened today 57> 105, no BM despite starting lactulose yesterday, will increase dosing today to 3 hours till 2 or 3 bowel movements then will decrease dosing  Hypertension -Coreg  Chronic diastolic heart failure -Without exacerbation  Hyperlipidemia -Pravachol  Diabetes mellitus type 2 -Semglee, sliding scale insulin  Thrombocytopenia -Chronic, at baseline, continue to  monitor closely as well DVT prophylaxis, hold DVT prophylaxis for platelet count<50 K. -Likely due to [facility] cirrhosis  DVT prophylaxis:  enoxaparin (LOVENOX) injection 30 mg Start: 08/28/22 1000 SCD's Start: 08/26/22 1242  Code Status: Full code Family Communication: D/W daughter by phone. Disposition Plan: SNF Status is: Inpatient   Consultants:  Podiatry ID Orthopedic surgery   Antimicrobials:  Anti-infectives (From admission, onward)    Start     Dose/Rate Route Frequency Ordered Stop   08/26/22 1330  ceFAZolin (ANCEF) IVPB 2g/100 mL premix  Status:  Discontinued        2 g 200 mL/hr over 30 Minutes Intravenous Every 8 hours 08/26/22 1241 08/26/22 1300   08/26/22 0745  ceFAZolin (ANCEF) IVPB 2g/100 mL premix        2 g 200 mL/hr over 30 Minutes Intravenous On call to O.R. 08/26/22 0656 08/26/22 1102   08/21/22 0100  vancomycin (VANCOREADY) IVPB 1500 mg/300 mL        1,500 mg 150 mL/hr over 120 Minutes Intravenous Every 24 hours 08/20/22 0050 08/27/22 0343   08/20/22 0800  ceFEPIme (MAXIPIME) 2 g in sodium chloride 0.9 % 100 mL IVPB  Status:  Discontinued        2 g 200 mL/hr over 30 Minutes Intravenous Every 8 hours 08/20/22 0033 08/24/22 1205   08/20/22 0015  vancomycin (VANCOREADY) IVPB 1500 mg/300 mL        1,500 mg 150 mL/hr over 120 Minutes Intravenous  Once 08/20/22 0012 08/20/22 0300   08/19/22 2345  vancomycin (VANCOCIN) IVPB 1000 mg/200 mL premix  Status:  Discontinued        1,000 mg 200 mL/hr over 60 Minutes Intravenous  Once 08/19/22 2334  08/20/22 0012   08/19/22 2345  ceFEPIme (MAXIPIME) 2 g in sodium chloride 0.9 % 100 mL IVPB        2 g 200 mL/hr over 30 Minutes Intravenous  Once 08/19/22 2334 08/20/22 0030        Objective: Vitals:   08/29/22 2353 08/30/22 0400 08/30/22 0809 08/30/22 1208  BP:   (!) 112/48 (!) (P) 109/59  Pulse:   71   Resp: 17 15 16  (P) 17  Temp: 98.4 F (36.9 C) 98.4 F (36.9 C) 98.1 F (36.7 C)   TempSrc: Oral Oral Oral  (P) Oral  SpO2:   92%   Weight:      Height:        Intake/Output Summary (Last 24 hours) at 08/30/2022 1658 Last data filed at 08/29/2022 2300 Gross per 24 hour  Intake --  Output 600 ml  Net -600 ml    Filed Weights   08/19/22 2103 08/21/22 0550  Weight: 79.8 kg 86.7 kg    Examination:   Awake Alert, Oriented X 3, she is appropriate, but significantly frail and deconditioned Symmetrical Chest wall movement, Good air movement bilaterally, CTAB RRR,No Gallops,Rubs or new Murmurs, No Parasternal Heave +ve B.Sounds, Abd Soft, No tenderness, No rebound - guarding or rigidity. Left Lower extremity status post BKA, with shrinker, wound VAC canister with good suction, no drainage in the VAC canister -   Data Reviewed: I have personally reviewed following labs and imaging studies  CBC: Recent Labs  Lab 08/24/22 0441 08/27/22 1106 08/28/22 0230 08/30/22 0255  WBC 4.1 4.0 4.0 2.9*  HGB 9.6* 8.5* 8.5* 8.4*  HCT 29.7* 25.7* 26.2* 25.2*  MCV 97.7 95.9 96.3 96.6  PLT 90* 62* 58* 64*   Basic Metabolic Panel: Recent Labs  Lab 08/24/22 0441 08/27/22 1106 08/28/22 0230 08/30/22 0255  NA 136 135 135 137  K 3.8 4.3 4.2 3.9  CL 108 113* 110 112*  CO2 25 21* 22 22  GLUCOSE 199* 183* 128* 136*  BUN 22 27* 31* 32*  CREATININE 0.76 0.79 0.82 0.82  CALCIUM 8.5* 7.7* 8.3* 8.1*  MG 1.8  --   --   --    GFR: Estimated Creatinine Clearance: 74.2 mL/min (by C-G formula based on SCr of 0.82 mg/dL). Liver Function Tests: No results for input(s): "AST", "ALT", "ALKPHOS", "BILITOT", "PROT", "ALBUMIN" in the last 168 hours.  No results for input(s): "LIPASE", "AMYLASE" in the last 168 hours. Recent Labs  Lab 08/29/22 1203 08/30/22 0255  AMMONIA 57* 105*   Coagulation Profile: No results for input(s): "INR", "PROTIME" in the last 168 hours. Cardiac Enzymes: No results for input(s): "CKTOTAL", "CKMB", "CKMBINDEX", "TROPONINI" in the last 168 hours. BNP (last 3 results) No results  for input(s): "PROBNP" in the last 8760 hours. HbA1C: No results for input(s): "HGBA1C" in the last 72 hours. CBG: Recent Labs  Lab 08/29/22 1232 08/29/22 1601 08/29/22 2153 08/30/22 0812 08/30/22 1210  GLUCAP 190* 128* 123* 118* 206*   Lipid Profile: No results for input(s): "CHOL", "HDL", "LDLCALC", "TRIG", "CHOLHDL", "LDLDIRECT" in the last 72 hours. Thyroid Function Tests: No results for input(s): "TSH", "T4TOTAL", "FREET4", "T3FREE", "THYROIDAB" in the last 72 hours. Anemia Panel: No results for input(s): "VITAMINB12", "FOLATE", "FERRITIN", "TIBC", "IRON", "RETICCTPCT" in the last 72 hours. Sepsis Labs: No results for input(s): "PROCALCITON", "LATICACIDVEN" in the last 168 hours.   Recent Results (from the past 240 hour(s))  Surgical pcr screen     Status: Abnormal   Collection Time:  08/20/22  8:00 PM   Specimen: Nasal Mucosa; Nasal Swab  Result Value Ref Range Status   MRSA, PCR POSITIVE (A) NEGATIVE Final    Comment: CRITICAL RESULT CALLED TO, READ BACK BY AND VERIFIED WITH: Redmond School, RN 2145 MH    Staphylococcus aureus POSITIVE (A) NEGATIVE Final    Comment: (NOTE) The Xpert SA Assay (FDA approved for NASAL specimens in patients 60 years of age and older), is one component of a comprehensive surveillance program. It is not intended to diagnose infection nor to guide or monitor treatment. Performed at [facility], [place] [address] [place], [place] [address]       Radiology Studies: No results found.    Scheduled Meds:  vitamin C  1,000 mg Oral Daily   carvedilol  6.25 mg Oral BID   docusate sodium  100 mg Oral Daily   enoxaparin (LOVENOX) injection  30 mg Subcutaneous Q24H   fentaNYL (SUBLIMAZE) injection  50 mcg Intravenous Once   ferrous sulfate  325 mg Oral BID WC   folic acid  1 mg Oral BID   insulin aspart  0-20 Units Subcutaneous TID WC   insulin aspart  0-5 Units Subcutaneous QHS   insulin glargine-yfgn  20 Units  Subcutaneous QHS   lactulose  30 g Oral Q3H   Followed by   lactulose  30 g Oral TID   leptospermum manuka honey  1 Application Topical Daily   nutrition supplement (JUVEN)  1 packet Oral BID BM   pantoprazole  40 mg Oral Daily   pravastatin  20 mg Oral QHS   sertraline  150 mg Oral Daily   zinc sulfate  220 mg Oral Daily   Continuous Infusions:  magnesium sulfate bolus IVPB       LOS: 10 days     Phillips Climes, MD [facility] 08/30/2022, 4:58 PM   Available via Epic secure chat 7am-7pm After these hours, please refer to coverage provider listed on [email or web address]

## 2022-08-31 DIAGNOSIS — E1169 Type 2 diabetes mellitus with other specified complication: Secondary | ICD-10-CM | POA: Diagnosis not present

## 2022-08-31 DIAGNOSIS — M86672 Other chronic osteomyelitis, left ankle and foot: Secondary | ICD-10-CM | POA: Diagnosis not present

## 2022-08-31 DIAGNOSIS — D631 Anemia in chronic kidney disease: Secondary | ICD-10-CM

## 2022-08-31 DIAGNOSIS — I10 Essential (primary) hypertension: Secondary | ICD-10-CM | POA: Diagnosis not present

## 2022-08-31 DIAGNOSIS — N189 Chronic kidney disease, unspecified: Secondary | ICD-10-CM

## 2022-08-31 LAB — COMPREHENSIVE METABOLIC PANEL
ALT: 19 U/L (ref 0–44)
AST: 37 U/L (ref 15–41)
Albumin: 2.1 g/dL — ABNORMAL LOW (ref 3.5–5.0)
Alkaline Phosphatase: 104 U/L (ref 38–126)
Anion gap: 8 (ref 5–15)
BUN: 30 mg/dL — ABNORMAL HIGH (ref 8–23)
CO2: 20 mmol/L — ABNORMAL LOW (ref 22–32)
Calcium: 8.5 mg/dL — ABNORMAL LOW (ref 8.9–10.3)
Chloride: 109 mmol/L (ref 98–111)
Creatinine, Ser: 0.78 mg/dL (ref 0.44–1.00)
GFR, Estimated: >60 mL/min (ref >60)
Glucose, Bld: 204 mg/dL — ABNORMAL HIGH (ref 70–99)
Potassium: 3.3 mmol/L — ABNORMAL LOW (ref 3.5–5.1)
Sodium: 137 mmol/L (ref 135–145)
Total Bilirubin: 1.6 mg/dL — ABNORMAL HIGH (ref 0.3–1.2)
Total Protein: 6.2 g/dL — ABNORMAL LOW (ref 6.5–8.1)

## 2022-08-31 LAB — CBC
HCT: 25 % — ABNORMAL LOW (ref 36.0–46.0)
Hemoglobin: 8.6 g/dL — ABNORMAL LOW (ref 12.0–15.0)
MCH: 32.2 pg (ref 26.0–34.0)
MCHC: 34.4 g/dL (ref 30.0–36.0)
MCV: 93.6 fL (ref 80.0–100.0)
Platelets: 69 10*3/uL — ABNORMAL LOW (ref 150–400)
RBC: 2.67 MIL/uL — ABNORMAL LOW (ref 3.87–5.11)
RDW: 15.2 % (ref 11.5–15.5)
WBC: 5.8 10*3/uL (ref 4.0–10.5)
nRBC: 0 % (ref 0.0–0.2)

## 2022-08-31 LAB — AMMONIA: Ammonia: 46 umol/L — ABNORMAL HIGH (ref 9–35)

## 2022-08-31 LAB — GLUCOSE, CAPILLARY
Glucose-Capillary: 131 mg/dL — ABNORMAL HIGH (ref 70–99)
Glucose-Capillary: 168 mg/dL — ABNORMAL HIGH (ref 70–99)
Glucose-Capillary: 196 mg/dL — ABNORMAL HIGH (ref 70–99)
Glucose-Capillary: 196 mg/dL — ABNORMAL HIGH (ref 70–99)

## 2022-08-31 MED ORDER — LACTULOSE 10 GM/15ML PO SOLN: 10.0000 g | Freq: Two times a day (BID) | ORAL | Status: DC

## 2022-08-31 MED ORDER — LACTULOSE 10 GM/15ML PO SOLN: 10.0000 g | Freq: Two times a day (BID) | ORAL | 0 refills | Status: DC

## 2022-08-31 MED ORDER — INSULIN ASPART 100 UNIT/ML IJ SOLN: INTRAMUSCULAR | 11 refills | Status: AC

## 2022-08-31 MED ORDER — RIFAXIMIN 550 MG PO TABS: 550.0000 mg | ORAL_TABLET | Freq: Two times a day (BID) | ORAL | Status: AC

## 2022-08-31 MED ORDER — POTASSIUM CHLORIDE CRYS ER 20 MEQ PO TBCR
40.0000 meq | EXTENDED_RELEASE_TABLET | Freq: Once | ORAL | Status: AC
  Administered 2022-08-31: 40 meq via ORAL
  Filled 2022-08-31: qty 2

## 2022-08-31 MED ORDER — OXYCODONE HCL 5 MG PO TABS: 5.0000 mg | ORAL_TABLET | Freq: Three times a day (TID) | ORAL | 0 refills | Status: DC | PRN

## 2022-08-31 MED ORDER — RIFAXIMIN 550 MG PO TABS
550.0000 mg | ORAL_TABLET | Freq: Two times a day (BID) | ORAL | Status: DC
  Administered 2022-08-31: 550 mg via ORAL
  Filled 2022-08-31: qty 1

## 2022-08-31 MED ORDER — TOUJEO MAX SOLOSTAR 300 UNIT/ML ~~LOC~~ SOPN: 20.0000 [IU] | PEN_INJECTOR | Freq: Every day | SUBCUTANEOUS | Status: AC

## 2022-08-31 NOTE — Plan of Care (Signed)
Problem: Education: Goal: Ability to describe self-care measures that may prevent or decrease complications (Diabetes Survival Skills Education) will improve Outcome: Completed/Met Goal: Individualized Educational Video(s) Outcome: Completed/Met   Problem: Coping: Goal: Ability to adjust to condition or change in health will improve Outcome: Completed/Met   Problem: Fluid Volume: Goal: Ability to maintain a balanced intake and output will improve Outcome: Completed/Met   Problem: Health Behavior/Discharge Planning: Goal: Ability to identify and utilize available resources and services will improve Outcome: Completed/Met Goal: Ability to manage health-related needs will improve Outcome: Completed/Met   Problem: Metabolic: Goal: Ability to maintain appropriate glucose levels will improve Outcome: Completed/Met   Problem: Nutritional: Goal: Maintenance of adequate nutrition will improve Outcome: Completed/Met Goal: Progress toward achieving an optimal weight will improve Outcome: Completed/Met   Problem: Skin Integrity: Goal: Risk for impaired skin integrity will decrease Outcome: Completed/Met   Problem: Tissue Perfusion: Goal: Adequacy of tissue perfusion will improve Outcome: Completed/Met   Problem: Education: Goal: Knowledge of General Education information will improve Description: Including pain rating scale, medication(s)/side effects and non-pharmacologic comfort measures Outcome: Completed/Met   Problem: Health Behavior/Discharge Planning: Goal: Ability to manage health-related needs will improve Outcome: Completed/Met   Problem: Clinical Measurements: Goal: Ability to maintain clinical measurements within normal limits will improve Outcome: Completed/Met Goal: Will remain free from infection Outcome: Completed/Met Goal: Diagnostic test results will improve Outcome: Completed/Met Goal: Respiratory complications will improve Outcome: Completed/Met Goal:  Cardiovascular complication will be avoided Outcome: Completed/Met   Problem: Activity: Goal: Risk for activity intolerance will decrease Outcome: Completed/Met   Problem: Nutrition: Goal: Adequate nutrition will be maintained Outcome: Completed/Met   Problem: Coping: Goal: Level of anxiety will decrease Outcome: Completed/Met   Problem: Elimination: Goal: Will not experience complications related to bowel motility Outcome: Completed/Met Goal: Will not experience complications related to urinary retention Outcome: Completed/Met   Problem: Pain Managment: Goal: General experience of comfort will improve Outcome: Completed/Met   Problem: Safety: Goal: Ability to remain free from injury will improve Outcome: Completed/Met   Problem: Skin Integrity: Goal: Risk for impaired skin integrity will decrease Outcome: Completed/Met   Problem: Education: Goal: Knowledge of General Education information will improve Description: Including pain rating scale, medication(s)/side effects and non-pharmacologic comfort measures Outcome: Completed/Met   Problem: Health Behavior/Discharge Planning: Goal: Ability to manage health-related needs will improve Outcome: Completed/Met   Problem: Clinical Measurements: Goal: Ability to maintain clinical measurements within normal limits will improve Outcome: Completed/Met Goal: Will remain free from infection Outcome: Completed/Met Goal: Diagnostic test results will improve Outcome: Completed/Met Goal: Respiratory complications will improve Outcome: Completed/Met Goal: Cardiovascular complication will be avoided Outcome: Completed/Met   Problem: Activity: Goal: Risk for activity intolerance will decrease Outcome: Completed/Met   Problem: Nutrition: Goal: Adequate nutrition will be maintained Outcome: Completed/Met   Problem: Coping: Goal: Level of anxiety will decrease Outcome: Completed/Met   Problem: Elimination: Goal: Will not  experience complications related to bowel motility Outcome: Completed/Met Goal: Will not experience complications related to urinary retention Outcome: Completed/Met   Problem: Pain Managment: Goal: General experience of comfort will improve Outcome: Completed/Met   Problem: Safety: Goal: Ability to remain free from injury will improve Outcome: Completed/Met   Problem: Skin Integrity: Goal: Risk for impaired skin integrity will decrease Outcome: Completed/Met   Problem: Education: Goal: Knowledge of the prescribed therapeutic regimen will improve Outcome: Completed/Met Goal: Ability to verbalize activity precautions or restrictions will improve Outcome: Completed/Met Goal: Understanding of discharge needs will improve Outcome: Completed/Met   Problem: Activity: Goal: Ability  to perform//tolerate increased activity and mobilize with assistive devices will improve Outcome: Completed/Met

## 2022-08-31 NOTE — Progress Notes (Signed)
Pt ordered to discharge to [facility] SNF with wound vac. BP WAS 120/50 Manually verified with doctor.AVS reviewed. Transferred by PTAR.Education provided as needed. Patient verbalized understanding. All questions answered.

## 2022-08-31 NOTE — Care Management Important Message (Signed)
Important Message  Patient Details  Name: Ariel Arnold MRN: [id number] Date of Birth: Aug 01, 1956   Medicare Important Message Given:  Yes     Orbie Pyo 08/31/2022, 1:55 PM

## 2022-08-31 NOTE — TOC Transition Note (Signed)
Transition of Care [facility]) - CM/SW Discharge Note   Patient Details  Name: Ariel Arnold MRN: [id number] Date of Birth: 1956-11-14  Transition of Care [facility]) CM/SW Contact:  Benard Halsted, LCSW Phone Number: 08/31/2022, 12:33 PM   Clinical Narrative:    Patient will DC to: [place] Anticipated DC date: 08/31/22 Family notified: Daughter, Manuela Schwartz Transport by: Corey Harold   Per MD patient ready for DC to [facility]. RN to call report prior to discharge ([phone]). RN, patient, patient's family, and facility notified of DC. Discharge Summary and FL2 sent to facility. DC packet on chart including signed script. Ambulance transport requested for patient.   CSW will sign off for now as social work intervention is no longer needed. Please consult [place] again if new needs arise.     Final next level of care: Skilled Nursing Facility Barriers to Discharge: Barriers Resolved   Patient Goals and CMS Choice Patient states their goals for this hospitalization and ongoing recovery are:: Rehab CMS Medicare.gov Compare Post Acute Care list provided to:: Patient Choice offered to / list presented to : Patient  Discharge Placement   Existing PASRR number confirmed :  ([place]; no need)          [facility] at:  [facility]) Patient to be transferred to facility by: [place] Name of family member notified: Daughter Patient and family notified of of transfer: 08/31/22  Discharge Plan and Services In-house Referral: Clinical Social Work   Post Acute Care Choice: [place]                               Social Determinants of Health ([place]) Interventions     Readmission Risk Interventions    11/16/2021    9:27 AM  Readmission Risk Prevention Plan  Home Care Screening Complete  Medication Review (RN CM) Complete

## 2022-08-31 NOTE — Progress Notes (Signed)
Physical Therapy Treatment Patient Details Name: Ariel Arnold MRN: [id number] DOB: June 26, 1956 Today's Date: 08/31/2022   History of Present Illness 66 y.o. female admitted 9/1 with left transmet infection and dehiscence s/p Lt BKA 9/8. PMhx: Lt TMA, HTN, T2DM, PVD, [place] cirrhosis, chronic pancytopenia, HLD, depression    PT Comments    Pt received in supine, agreeable to therapy session with good participation and fair tolerance for bed mobility and good effort for LE supine exercises. Defer EOB for pt safety due to soft BP, MAP (63) taken in LUE and (58) taken in RUE. Encouraged increased intake, pt able to eat some bites of her breakfast (which had been untouched) with encouragement. Pt assisted with peri-care along with RN who was present but with continued loose stools after hygiene assist so left with bed pad in place and pt to alert RN when done with BM. Pt continues to benefit from PT services to progress toward functional mobility goals.   Recommendations for follow up therapy are one component of a multi-disciplinary discharge planning process, led by the attending physician.  Recommendations may be updated based on patient status, additional functional criteria and insurance authorization.  Follow Up Recommendations  Skilled nursing-short term rehab (<3 hours/day) Can patient physically be transported by private vehicle: No   Assistance Recommended at Discharge Frequent or constant Supervision/Assistance  Patient can return home with the following Two people to help with walking and/or transfers;A lot of help with bathing/dressing/bathroom;Assistance with cooking/housework;Direct supervision/assist for medications management;Direct supervision/assist for financial management;Assist for transportation;Help with stairs or ramp for entrance   Equipment Recommendations  Other (comment) (defer to post-acute rehab)    Recommendations for Other Services       Precautions /  Restrictions Precautions Precautions: Fall Precaution Comments: L limb protector, wound vac, b/b incontinence Restrictions Weight Bearing Restrictions: Yes LLE Weight Bearing: Non weight bearing     Mobility  Bed Mobility Overal bed mobility: Needs Assistance Bed Mobility: Rolling, Supine to Sit, Sit to Supine Rolling: Mod assist   Supine to sit: Max assist, HOB elevated Sit to supine: Total assist, HOB elevated   General bed mobility comments: cues for self-assist to roll via cross-body reaching and using RLE; pt with poor seated tolerance in long sit due to episode of bowel incontinence, needing clean-up. After rolling for clean-up, pt reporting continued BM and noted low diastolic BP so deferred EOB/OOB until following session.    Transfers Overall transfer level: Needs assistance                Lateral/Scoot Transfers: Total assist, +2 physical assistance General transfer comment: max cues for sequencing/technique with x3 attempts but pt unable to significantly assist, pt needing +2 totalA with bed pads to scoot with poor propulsion achieved each attempt.        Balance Overall balance assessment: Needs assistance Sitting-balance support: Feet supported, Bilateral upper extremity supported Sitting balance-Leahy Scale: Poor Sitting balance - Comments: increased trunk support this date due to abdominal discomfort and distraction; defer EOB due to pt soft BP       Standing balance comment: pt unable                            Cognition Arousal/Alertness: Awake/alert Behavior During Therapy: Flat affect Overall Cognitive Status: No family/caregiver present to determine baseline cognitive functioning  General Comments: Slow processing throughout, pt perseverating on bowel discomfort and loose stools, able to follow simple commands with increased time.        Exercises Other Exercises Other Exercises:  supine BLE AROM: heel slides (R only), hip abduction; knee flexion/extension (LLE), SLR (AA on R) x10 reps ea Other Exercises: supine BUE AROM: elbow and shoulder flexion/extension x10 reps ea    General Comments General comments (skin integrity, edema, etc.): BP 105/32 (56) LUE supine; BP 116/31 (63) RUE supine after UE exercises; VSS otherwise      Pertinent Vitals/Pain Pain Assessment Pain Assessment: 0-10 Pain Score: 5  Pain Location: bottom/peri area, LLE Pain Descriptors / Indicators: Guarding, Grimacing, Discomfort, Burning Pain Intervention(s): Limited activity within patient's tolerance, Monitored during session, Repositioned    Home Living                          Prior Function            PT Goals (current goals can now be found in the care plan section) Acute Rehab PT Goals Patient Stated Goal: did not state PT Goal Formulation: Patient unable to participate in goal setting Time For Goal Achievement: 09/10/22 Progress towards PT goals: Progressing toward goals    Frequency    Min 3X/week      PT Plan Current plan remains appropriate       AM-PAC PT "6 Clicks" Mobility   Outcome Measure  Help needed turning from your back to your side while in a flat bed without using bedrails?: A Lot Help needed moving from lying on your back to sitting on the side of a flat bed without using bedrails?: A Lot Help needed moving to and from a bed to a chair (including a wheelchair)?: Total Help needed standing up from a chair using your arms (e.g., wheelchair or bedside chair)?: Total Help needed to walk in hospital room?: Total Help needed climbing 3-5 steps with a railing? : Total 6 Click Score: 8    End of Session Equipment Utilized During Treatment: Gait belt;Other (comment) (L limb guard) Activity Tolerance: Patient limited by fatigue;Patient limited by pain;Other (comment) (bowel incontinence limiting) Patient left: in bed;with call bell/phone within  reach;with bed alarm set;Other (comment) (L limb guard donned prior to rolling and remained on at end of session; lower end of bed elevated and LLE distal end elevated on rolled blankets due to edema and soft BP) Nurse Communication: Mobility status;Need for lift equipment;Other (comment) (soft BP; incontinence) PT Visit Diagnosis: Muscle weakness (generalized) (M62.81);Other abnormalities of gait and mobility (R26.89)     Time: 9295-7473 PT Time Calculation (min) (ACUTE ONLY): 32 min  Charges:  $Therapeutic Exercise: 8-22 mins $Therapeutic Activity: 8-22 mins                     Robyne Matar P., PTA Acute Rehabilitation Services Secure Chat Preferred 9a-5:30pm Office: [place] 08/31/2022, 2:09 PM

## 2022-08-31 NOTE — Discharge Summary (Addendum)
PATIENT DETAILS Name: Ariel Arnold Age: 66 y.o. Sex: female Date of Birth: May 06, 1956 MRN: [id number]. Admitting Physician: Elwyn Reach, MD YIA:[id number], Claudette Laws, DO  Admit Date: 08/19/2022 Discharge date: 08/31/2022  Recommendations for Outpatient Follow-up:  Follow up with PCP in 1-2 weeks Please obtain CMP/CBC in one week Please ensure follow-up with Dr. Sharol Given  Admitted From:  SNF  Disposition: Skilled nursing facility   Discharge Condition: fair  CODE STATUS:   Code Status: Full Code   Diet recommendation:  Diet Order             Diet - low sodium heart healthy           Diet Carb Modified           Diet Carb Modified Fluid consistency: Thin; Room service appropriate? Yes  Diet effective now                    Brief Summary: 66 year old female with history of DM-2, HTN, NASH cirrhosis, HTN-who has had prior record left foot infection-found to have acute osteomyelitis of second through fifth metatarsals-evaluated by Dr. Rolly Pancake left BKA-Hospital course complicated by mild hepatic encephalopathy.  See below for further details.   Brief Hospital Course: Acute osteomyelitis of residual 2nd-5th metatarsal bones of the left foot (prior left TMA): Initially placed on IV antibiotics-evaluated by Dr. Rolly Pancake left BKA.  All cultures negative to date-no need for further antibiotics at this time as foci of infection has been removed.  Hepatic encephalopathy: Developed mild confusion yesterday-ammonia levels increased-dosage of lactulose escalated-with significant improvement-she is much better today-had significant BMs overnight.  She apparently does not like taking lactulose as it causes her to have frequent BMs-she has been counseled-dose of lactulose decreased slightly-add rifaximin.  Follow-up with outpatient gastroenterology/PCP for further optimization.  NASH cirrhosis: Apart from mild hepatic encephalopathy-relatively well compensated.   Resume prior diuretic regimen.  HTN: Continue Coreg  Chronic HFpEF: Euvolemic-resume prior diuretic regimen.  HLD: Continue statin  DM-2: CBG stable-continue  BMI: Estimated body mass index is 29.95 kg/m as calculated from the following:   Height as of this encounter: 5' 7"  (1.702 m).   Weight as of this encounter: 86.7 kg.    Discharge Diagnoses:  Principal Problem:   Chronic osteomyelitis involving ankle and foot, left (HCC) Active Problems:   Liver cirrhosis secondary to NASH (HCC)   Thrombocytopenia (HCC)   Coronary atherosclerosis   Generalized anxiety disorder   Mixed hyperlipidemia   Type 2 diabetes mellitus treated with insulin (HCC)   Essential hypertension   Lactic acidosis   Diabetic osteomyelitis [facility])   Discharge Instructions:  Activity:  As tolerated with Full fall precautions use walker/cane & assistance as needed   Discharge Instructions     Diet - low sodium heart healthy   Complete by: As directed    Diet Carb Modified   Complete by: As directed    Discharge instructions   Complete by: As directed    Follow with Primary MD  Jacqualine Code, DO in 1-2 weeks  Please get a complete blood count and chemistry panel checked by your Primary MD at your next visit, and again as instructed by your Primary MD.  Get Medicines reviewed and adjusted: Please take all your medications with you for your next visit with your Primary MD  Laboratory/radiological data: Please request your Primary MD to go over all hospital tests and procedure/radiological results at the follow up, please ask your Primary MD  to get all Hospital records sent to his/her office.  In some cases, they will be blood work, cultures and biopsy results pending at the time of your discharge. Please request that your primary care M.D. follows up on these results.  Also Note the following: If you experience worsening of your admission symptoms, develop shortness of breath, life  threatening emergency, suicidal or homicidal thoughts you must seek medical attention immediately by calling 911 or calling your MD immediately  if symptoms less severe.  You must read complete instructions/literature along with all the possible adverse reactions/side effects for all the Medicines you take and that have been prescribed to you. Take any new Medicines after you have completely understood and accpet all the possible adverse reactions/side effects.   Do not drive when taking Pain medications or sleeping medications (Benzodaizepines)  Do not take more than prescribed Pain, Sleep and Anxiety Medications. It is not advisable to combine anxiety,sleep and pain medications without talking with your primary care practitioner  Special Instructions: If you have smoked or chewed Tobacco  in the last 2 yrs please stop smoking, stop any regular Alcohol  and or any Recreational drug use.  Wear Seat belts while driving.  Please note: You were cared for by a hospitalist during your hospital stay. Once you are discharged, your primary care physician will handle any further medical issues. Please note that NO REFILLS for any discharge medications will be authorized once you are discharged, as it is imperative that you return to your primary care physician (or establish a relationship with a primary care physician if you do not have one) for your post hospital discharge needs so that they can reassess your need for medications and monitor your lab values.   Discharge wound care:   Complete by: As directed    Negative Pressure Wound Therapy - Incisional  Until discontinued   Increase activity slowly   Complete by: As directed    Negative Pressure Wound Therapy - Incisional   Complete by: As directed       Allergies as of 08/31/2022       Reactions   Chlorhexidine Rash   Tape Rash        Medication List     STOP taking these medications    HumaLOG KwikPen 100 UNIT/ML KwikPen Generic  drug: insulin lispro       TAKE these medications    Accu-Chek Guide test strip Generic drug: glucose blood   Accu-Chek Guide w/Device Kit   Accu-Chek Softclix Lancets lancets SMARTSIG:2 Topical Twice Daily   albuterol 108 (90 Base) MCG/ACT inhaler Commonly known as: VENTOLIN HFA Inhale 2 puffs into the lungs every 4 (four) hours as needed for shortness of breath or wheezing.   ascorbic acid 500 MG tablet Commonly known as: VITAMIN C Take 500 mg by mouth 2 (two) times daily.   Calcium 1200 1200-1000 MG-UNIT Chew Chew 2 tablets by mouth daily.   carvedilol 6.25 MG tablet Commonly known as: COREG Take 1 tablet (6.25 mg total) by mouth 2 (two) times daily.   clotrimazole-betamethasone cream Commonly known as: LOTRISONE Apply 1 Application topically daily as needed (for yeast/rash).   diclofenac Sodium 1 % Gel Commonly known as: VOLTAREN Apply 2 g topically in the morning and at bedtime.   ferrous sulfate 325 (65 FE) MG tablet Take 325 mg by mouth in the morning and at bedtime.   folic acid 1 MG tablet Commonly known as: FOLVITE Take 1 mg by mouth 2 (  two) times daily.   furosemide 20 MG tablet Commonly known as: LASIX Take 20 mg by mouth once a week.   gabapentin 600 MG tablet Commonly known as: NEURONTIN Take 600 mg by mouth 3 (three) times daily as needed (nerve pain).   Global Ease Inject Pen Needles 32G X 4 MM Misc Generic drug: Insulin Pen Needle   hydrOXYzine 25 MG tablet Commonly known as: ATARAX Take 25 mg by mouth daily.   insulin aspart 100 UNIT/ML injection Commonly known as: novoLOG 0-20 Units, Subcutaneous, 3 times daily with meal CBG < 70: Implement Hypoglycemia measures CBG 70 - 120: 0 units CBG 121 - 150: 3 units CBG 151 - 200: 4 units CBG 201 - 250: 7 units CBG 251 - 300: 11 units CBG 301 - 350: 15 units CBG 351 - 400: 20 units CBG > 400: call MD   ketorolac 0.5 % ophthalmic solution Commonly known as: ACULAR Place 1 drop into both eyes 4  (four) times daily as needed (dry eyes).   lactulose 10 GM/15ML solution Commonly known as: CHRONULAC Take 15 mLs (10 g total) by mouth 2 (two) times daily. for ammonia levels What changed: how much to take   oxyCODONE 5 MG immediate release tablet Commonly known as: Oxy IR/ROXICODONE Take 1 tablet (5 mg total) by mouth every 8 (eight) hours as needed for moderate pain (pain score 4-6). What changed:  medication strength when to take this reasons to take this   pantoprazole 40 MG tablet Commonly known as: PROTONIX Take 40 mg by mouth daily as needed (heartburn).   pravastatin 20 MG tablet Commonly known as: PRAVACHOL Take 1 tablet (20 mg total) by mouth at bedtime. Take after completion of daptomycin   rifaximin 550 MG Tabs tablet Commonly known as: XIFAXAN Take 1 tablet (550 mg total) by mouth 2 (two) times daily.   sertraline 100 MG tablet Commonly known as: ZOLOFT Take 2 tablets (200 mg total) by mouth daily. What changed: how much to take   spironolactone 50 MG tablet Commonly known as: ALDACTONE Take 50 mg by mouth daily as needed (swelling).   Toujeo Max SoloStar 300 UNIT/ML Solostar Pen Generic drug: insulin glargine (2 Unit Dial) Inject 20 Units into the skin at bedtime. What changed: how much to take   Vitamin D (Ergocalciferol) 1.25 MG (50000 UNIT) Caps capsule Commonly known as: DRISDOL Take 1 capsule by mouth every 7 (seven) days. Mondays               Discharge Care Instructions  (From admission, onward)           Start     Ordered   08/31/22 0000  Discharge wound care:       Comments: Negative Pressure Wound Therapy - Incisional  Until discontinued   08/31/22 8786            Follow-up Information     Newt Minion, MD Follow up in 1 week(s).   Specialty: Orthopedic Surgery Contact information: [place] [address] [phone]                Allergies  Allergen Reactions   Chlorhexidine Rash    Tape Rash     Other Procedures/Studies: DG Foot Complete Left  Result Date: 08/19/2022 CLINICAL DATA:  Concern for osteomyelitis. EXAM: LEFT FOOT - COMPLETE 3+ VIEW COMPARISON:  Radiographs dated June 04, 2022 FINDINGS: Postsurgical changes for prior transmetatarsal amputation. There is osseous erosions about the second through  fifth residual metatarsals most prominent about the fourth and fifth metatarsal concerning for osteomyelitis. Marked soft tissue swelling about the dorsum of the foot. IMPRESSION: Cortical erosions and periosteal reaction about second through fifth residual metatarsals most prominent about the fourth and fifth metatarsals consistent with acute osteomyelitis. Marked soft tissue swelling about the prior amputation site. Electronically Signed   By: Keane Police D.O.   On: 08/19/2022 22:28     TODAY-DAY OF DISCHARGE:  Subjective:   Kimetha Trulson today has no headache,no chest abdominal pain,no new weakness tingling or numbness.  She is relatively awake and alert today and able to answer simple questions.  No flapping tremor.  Objective:   Blood pressure (!) 115/38, pulse 80, temperature 98.5 F (36.9 C), temperature source Oral, resp. rate 18, height 5' 7"  (1.702 m), weight 86.7 kg, SpO2 96 %.  Intake/Output Summary (Last 24 hours) at 08/31/2022 1003 Last data filed at 08/30/2022 2000 Gross per 24 hour  Intake 120 ml  Output 0 ml  Net 120 ml   Filed Weights   08/19/22 2103 08/21/22 0550  Weight: 79.8 kg 86.7 kg    Exam: Awake Alert, Oriented *3, No new F.N deficits, Normal affect [place].AT,PERRAL Supple Neck,No JVD, No cervical lymphadenopathy appriciated.  Symmetrical Chest wall movement, Good air movement bilaterally, CTAB RRR,No Gallops,Rubs or new Murmurs, No Parasternal Heave +ve B.Sounds, Abd Soft, Non tender, No organomegaly appriciated, No rebound -guarding or rigidity. No Cyanosis, Clubbing or edema, No new Rash or bruise   PERTINENT RADIOLOGIC  STUDIES: No results found.   PERTINENT LAB RESULTS: CBC: Recent Labs    08/30/22 0255 08/31/22 0309  WBC 2.9* 5.8  HGB 8.4* 8.6*  HCT 25.2* 25.0*  PLT 64* 69*   CMET CMP     Component Value Date/Time   NA 137 08/31/2022 0309   K 3.3 (L) 08/31/2022 0309   CL 109 08/31/2022 0309   CO2 20 (L) 08/31/2022 0309   GLUCOSE 204 (H) 08/31/2022 0309   BUN 30 (H) 08/31/2022 0309   CREATININE 0.78 08/31/2022 0309   CALCIUM 8.5 (L) 08/31/2022 0309   PROT 6.2 (L) 08/31/2022 0309   ALBUMIN 2.1 (L) 08/31/2022 0309   AST 37 08/31/2022 0309   ALT 19 08/31/2022 0309   ALKPHOS 104 08/31/2022 0309   BILITOT 1.6 (H) 08/31/2022 0309   GFRNONAA >60 08/31/2022 0309    GFR Estimated Creatinine Clearance: 76.1 mL/min (by C-G formula based on SCr of 0.78 mg/dL). No results for input(s): "LIPASE", "AMYLASE" in the last 72 hours. No results for input(s): "CKTOTAL", "CKMB", "CKMBINDEX", "TROPONINI" in the last 72 hours. Invalid input(s): "POCBNP" No results for input(s): "DDIMER" in the last 72 hours. No results for input(s): "HGBA1C" in the last 72 hours. No results for input(s): "CHOL", "HDL", "LDLCALC", "TRIG", "CHOLHDL", "LDLDIRECT" in the last 72 hours. No results for input(s): "TSH", "T4TOTAL", "T3FREE", "THYROIDAB" in the last 72 hours.  Invalid input(s): "FREET3" No results for input(s): "VITAMINB12", "FOLATE", "FERRITIN", "TIBC", "IRON", "RETICCTPCT" in the last 72 hours. Coags: No results for input(s): "INR" in the last 72 hours.  Invalid input(s): "PT" Microbiology: No results found for this or any previous visit (from the past 240 hour(s)).  FURTHER DISCHARGE INSTRUCTIONS:  Get Medicines reviewed and adjusted: Please take all your medications with you for your next visit with your Primary MD  Laboratory/radiological data: Please request your Primary MD to go over all hospital tests and procedure/radiological results at the follow up, please ask your Primary MD to  get all  Hospital records sent to his/her office.  In some cases, they will be blood work, cultures and biopsy results pending at the time of your discharge. Please request that your primary care M.D. goes through all the records of your hospital data and follows up on these results.  Also Note the following: If you experience worsening of your admission symptoms, develop shortness of breath, life threatening emergency, suicidal or homicidal thoughts you must seek medical attention immediately by calling 911 or calling your MD immediately  if symptoms less severe.  You must read complete instructions/literature along with all the possible adverse reactions/side effects for all the Medicines you take and that have been prescribed to you. Take any new Medicines after you have completely understood and accpet all the possible adverse reactions/side effects.   Do not drive when taking Pain medications or sleeping medications (Benzodaizepines)  Do not take more than prescribed Pain, Sleep and Anxiety Medications. It is not advisable to combine anxiety,sleep and pain medications without talking with your primary care practitioner  Special Instructions: If you have smoked or chewed Tobacco  in the last 2 yrs please stop smoking, stop any regular Alcohol  and or any Recreational drug use.  Wear Seat belts while driving.  Please note: You were cared for by a hospitalist during your hospital stay. Once you are discharged, your primary care physician will handle any further medical issues. Please note that NO REFILLS for any discharge medications will be authorized once you are discharged, as it is imperative that you return to your primary care physician (or establish a relationship with a primary care physician if you do not have one) for your post hospital discharge needs so that they can reassess your need for medications and monitor your lab values.  Total Time spent coordinating discharge including counseling,  education and face to face time equals greater than 30 minutes.  SignedOren Binet 08/31/2022 10:03 AM

## 2022-08-31 NOTE — TOC Progression Note (Signed)
Transition of Care [facility]) - Progression Note    Patient Details  Name: Ariel Arnold MRN: [id number] Date of Birth: 11-14-1956  Transition of Care [facility]) CM/SW [place], LCSW Phone Number: 08/31/2022, 10:23 AM  Clinical Narrative:    Biochemist, clinical received for [facility], Ref# [id number]. They can accept patient today.    Expected Discharge Plan: [place] Barriers to Discharge: Continued Medical Work up, SNF Pending bed offer  Expected Discharge Plan and Services Expected Discharge Plan: [place] In-house Referral: Clinical Social Work   Post Acute Care Choice: [place] Living arrangements for the past 2 months: Single Family Home Expected Discharge Date: 08/31/22                                     Social Determinants of Health (SDOH) Interventions    Readmission Risk Interventions    11/16/2021    9:27 AM  Readmission Risk Prevention Plan  Home Care Screening Complete  Medication Review (RN CM) Complete

## 2022-09-01 ENCOUNTER — Telehealth: Payer: Self-pay | Admitting: Orthopedic Surgery

## 2022-09-01 NOTE — Telephone Encounter (Signed)
/  I/ called and sw Michelle. Advised that Ariel Arnold was calling about the wound vac. Advised she can leave this on until her appt on . If it alarms due to canister being full or a leak they can remove and apply a dry dressing change daily. Otherwise to leave intact until first post op appt. Will call with any other questions.

## 2022-09-01 NOTE — Telephone Encounter (Signed)
Ariel Arnold from [facility] re[facility] called and states she would like to speak with one of Dr.Dudas nurses. She is the wound nurse at the facility.  Cb [phone]

## 2022-09-13 ENCOUNTER — Encounter: Payer: Self-pay | Admitting: Family

## 2022-09-13 ENCOUNTER — Ambulatory Visit (INDEPENDENT_AMBULATORY_CARE_PROVIDER_SITE_OTHER): Payer: Medicare Other | Admitting: Family

## 2022-09-13 DIAGNOSIS — Z89512 Acquired absence of left leg below knee: Secondary | ICD-10-CM

## 2022-09-13 MED ORDER — OXYCODONE HCL 5 MG PO TABS: 5.0000 mg | ORAL_TABLET | Freq: Four times a day (QID) | ORAL | 0 refills | Status: DC | PRN

## 2022-09-13 NOTE — Progress Notes (Signed)
Post-Op Visit Note   Patient: Ariel Arnold           Date of Birth: June 20, 1956           MRN: [id number] Visit Date: 09/13/2022 PCP: Jacqualine Code, DO  Chief Complaint:  Chief Complaint  Patient presents with   Left Leg - Routine Post Op    08/26/2022 left BKA with kerecis     HPI:  HPI The patient is a 66 year old woman who is seen status post left below-knee amputation on September 8 this is healing well she is residing in a skilled nursing Ortho Exam On examination of the left residual limb this is well consolidated staples in place incision is healing well there is no gaping or drainage there is 1 fluid-filled serous drainage blister this is 1 cm in diameter centrally no erythema no warmth  Visit Diagnoses: No diagnosis found.  Plan: Daily Dial soap cleansing.  Dry dressings.  Follow-up in 1 more week for staple removal.  Follow-Up Instructions: Return in about 1 week (around 09/20/2022).   Imaging: No results found.  Orders:  No orders of the defined types were placed in this encounter.  Meds ordered this encounter  Medications   oxyCODONE (OXY IR/ROXICODONE) 5 MG immediate release tablet    Sig: Take 1 tablet (5 mg total) by mouth every 6 (six) hours as needed for moderate pain (pain score 4-6).    Dispense:  30 tablet    Refill:  0     PMFS History: Patient Active Problem List   Diagnosis Date Noted   Diabetic osteomyelitis ([place])    MRSA bacteremia 06/06/2022   Secondary esophageal varices with bleeding ([place])    Cellulitis of left foot 06/02/2022   Ascites 04/05/2022   Cellulitis 04/02/2022   Lactic acidosis 04/02/2022   Chronic osteomyelitis involving ankle and foot, left ([place]) 11/09/2021   Essential hypertension 11/09/2021   Hematemesis    Liver cirrhosis secondary to NASH ([place]) 05/19/2021   Thrombocytopenia ([place]) 05/19/2021   Obesity (BMI 30-39.9) 02/15/2021   Acquired absence of left foot ([place]) 01/11/2021   Non-healing wound of amputation  stump ([place]) 09/09/2020   Coronary atherosclerosis 08/04/2020   Non-prs chronic ulcer oth prt right foot w fat layer exposed ([place]) 12/24/2019   Acquired absence of other left toe(s) ([place]) 11/19/2019   Stage 2 chronic kidney disease due to type 2 diabetes mellitus ([place]) 40/98/1191   Nonalcoholic fatty liver disease 05/07/2019   Type 2 diabetes mellitus treated with insulin ([place]) 05/06/2019   Depressive disorder 04/18/2019   Generalized anxiety disorder 04/18/2019   Seasonal allergic rhinitis 04/18/2019   Arthritis 04/18/2019   Mixed hyperlipidemia 12/19/2018   Past Medical History:  Diagnosis Date   Anemia    Anxiety    Aortic atherosclerosis ([place])    Arthritis    AVM (arteriovenous malformation) of colon    Depression    Essential hypertension    GI bleeding    Recurrent   Hepatic encephalopathy ([place])    History of kidney stones    History of RSV infection    Liver cirrhosis secondary to NASH ([place]) 2004   Biopsy-proven   Obesity    Osteomyelitis ([place])    Peripheral vascular disease (HCC)    Thrombocytopenia ([place])    Type 2 diabetes mellitus ([place])     Family History  Problem Relation Age of Onset   Heart failure Mother    Hyperlipidemia Mother    Diabetes Mellitus II Mother  Heart failure Father    Hyperlipidemia Father     Past Surgical History:  Procedure Laterality Date   ABDOMINAL HYSTERECTOMY     AMPUTATION Left 04/05/2022   Procedure: Metatarsal amputation revision;  Surgeon: Lorenda Peck, MD;  Location: [facility];  Service: Podiatry;  Laterality: Left;  Surgical team to do block   AMPUTATION Left 08/26/2022   Procedure: LEFT BELOW KNEE AMPUTATION;  Surgeon: Newt Minion, MD;  Location: [facility];  Service: Orthopedics;  Laterality: Left;   APPLICATION OF WOUND VAC Left 08/26/2022   Procedure: APPLICATION OF WOUND VAC;  Surgeon: Newt Minion, MD;  Location: [facility];  Service: Orthopedics;  Laterality: Left;   COLONOSCOPY     Cystoscopy with ureteral stent      ESOPHAGOGASTRODUODENOSCOPY     ESOPHAGOGASTRODUODENOSCOPY (EGD) WITH PROPOFOL N/A 05/20/2021   Procedure: ESOPHAGOGASTRODUODENOSCOPY (EGD) WITH PROPOFOL;  Surgeon: Daneil Dolin, MD;  [facility];  Service: Endoscopy;  Laterality: N/A;  needs intubation   IR ANGIOGRAM FOLLOW UP STUDY  05/21/2021   IR ANGIOGRAM SELECTIVE EACH ADDITIONAL VESSEL  05/21/2021   IR EMBO VENOUS NOT HEMORR HEMANG  INC GUIDE ROADMAPPING  05/21/2021   IR US GUIDE VASC ACCESS RIGHT  05/21/2021   IR VENOGRAM RENAL UNI LEFT  05/21/2021   IRRIGATION AND DEBRIDEMENT FOOT Left 06/04/2022   Procedure: REVISIONAL TRAMSMETATARSAL AMPUTATION;  Surgeon: Felipa Furnace, DPM;  Location: [facility];  Service: Podiatry;  Laterality: Left;   RADIOLOGY WITH ANESTHESIA N/A 05/21/2021   Procedure: IR WITH ANESTHESIA;  Surgeon: Radiologist, Medication, MD;  Location: [facility];  Service: Radiology;  Laterality: N/A;   Toe amputations Bilateral    TUBAL LIGATION     WOUND DEBRIDEMENT Left 11/12/2021   Procedure: DEBRIDEMENT WOUND;FIRST METATARSAL RESECTION; APPLICATION OF SKIN GRAFT SUBSTITUTE;  Surgeon: Evelina Bucy, DPM;  Location: [facility];  Service: Podiatry;  Laterality: Left;   Social History   Occupational History   Not on file  Tobacco Use   Smoking status: Former    Types: Cigarettes   Smokeless tobacco: Never  Vaping Use   Vaping Use: Never used  Substance and Sexual Activity   Alcohol use: Yes    Comment: wine rare   Drug use: Never   Sexual activity: Not on file

## 2022-09-20 ENCOUNTER — Encounter: Payer: Medicare Other | Admitting: Family

## 2022-09-27 ENCOUNTER — Encounter: Payer: Self-pay | Admitting: Family

## 2022-09-27 ENCOUNTER — Ambulatory Visit (INDEPENDENT_AMBULATORY_CARE_PROVIDER_SITE_OTHER): Payer: Medicare Other | Admitting: Family

## 2022-09-27 DIAGNOSIS — Z89512 Acquired absence of left leg below knee: Secondary | ICD-10-CM

## 2022-09-27 NOTE — Progress Notes (Signed)
Post-Op Visit Note   Patient: Ariel Arnold           Date of Birth: 1956/11/26           MRN: [id number] Visit Date: 09/27/2022 PCP: Jacqualine Code, DO  Chief Complaint:  Chief Complaint  Patient presents with   Left Leg - Routine Post Op    08/26/2022 left BKA with kerecis    HPI:  HPI The patient is a 66 year old woman seen status post left below-knee amputation her incision is well-healed she is residing at skilled nursing  Ortho Exam On examination of the left residual limb this is well-healed staples are in place there is no gaping no drainage no erythema it is not yet consolidated  Visit Diagnoses: No diagnosis found.  Plan: Continue daily Dial soap cleansing.  Dry dressings.  Shrinker once obtained may proceed with prosthesis set up we will follow-up in the office in 2 months  Follow-Up Instructions: No follow-ups on file.   Imaging: No results found.  Orders:  No orders of the defined types were placed in this encounter.  No orders of the defined types were placed in this encounter.    PMFS History: Patient Active Problem List   Diagnosis Date Noted   Diabetic osteomyelitis ([place])    MRSA bacteremia 06/06/2022   Secondary esophageal varices with bleeding ([place])    Cellulitis of left foot 06/02/2022   Ascites 04/05/2022   Cellulitis 04/02/2022   Lactic acidosis 04/02/2022   Chronic osteomyelitis involving ankle and foot, left ([place]) 11/09/2021   Essential hypertension 11/09/2021   Hematemesis    Liver cirrhosis secondary to NASH ([place]) 05/19/2021   Thrombocytopenia ([place]) 05/19/2021   Obesity (BMI 30-39.9) 02/15/2021   Acquired absence of left foot ([place]) 01/11/2021   Non-healing wound of amputation stump ([place]) 09/09/2020   Coronary atherosclerosis 08/04/2020   Non-prs chronic ulcer oth prt right foot w fat layer exposed ([place]) 12/24/2019   Acquired absence of other left toe(s) ([place]) 11/19/2019   Stage 2 chronic kidney disease due to type 2 diabetes  mellitus ([place]) 01/75/1025   Nonalcoholic fatty liver disease 05/07/2019   Type 2 diabetes mellitus treated with insulin ([place]) 05/06/2019   Depressive disorder 04/18/2019   Generalized anxiety disorder 04/18/2019   Seasonal allergic rhinitis 04/18/2019   Arthritis 04/18/2019   Mixed hyperlipidemia 12/19/2018   Past Medical History:  Diagnosis Date   Anemia    Anxiety    Aortic atherosclerosis ([place])    Arthritis    AVM (arteriovenous malformation) of colon    Depression    Essential hypertension    GI bleeding    Recurrent   Hepatic encephalopathy ([place])    History of kidney stones    History of RSV infection    Liver cirrhosis secondary to NASH ([place]) 2004   Biopsy-proven   Obesity    Osteomyelitis ([place])    Peripheral vascular disease ([place])    Thrombocytopenia ([place])    Type 2 diabetes mellitus ([place])     Family History  Problem Relation Age of Onset   Heart failure Mother    Hyperlipidemia Mother    Diabetes Mellitus II Mother    Heart failure Father    Hyperlipidemia Father     Past Surgical History:  Procedure Laterality Date   ABDOMINAL HYSTERECTOMY     AMPUTATION Left 04/05/2022   Procedure: Metatarsal amputation revision;  Surgeon: Lorenda Peck, MD;  Location: [facility];  Service: Podiatry;  Laterality: Left;  Surgical  team to do block   AMPUTATION Left 08/26/2022   Procedure: LEFT BELOW KNEE AMPUTATION;  Surgeon: Newt Minion, MD;  Location: [facility];  Service: Orthopedics;  Laterality: Left;   APPLICATION OF WOUND VAC Left 08/26/2022   Procedure: APPLICATION OF WOUND VAC;  Surgeon: Newt Minion, MD;  Location: [facility];  Service: Orthopedics;  Laterality: Left;   COLONOSCOPY     Cystoscopy with ureteral stent     ESOPHAGOGASTRODUODENOSCOPY     ESOPHAGOGASTRODUODENOSCOPY (EGD) WITH PROPOFOL N/A 05/20/2021   Procedure: ESOPHAGOGASTRODUODENOSCOPY (EGD) WITH PROPOFOL;  Surgeon: Daneil Dolin, MD;  [facility];  Service: Endoscopy;  Laterality: N/A;  needs  intubation   IR ANGIOGRAM FOLLOW UP STUDY  05/21/2021   IR ANGIOGRAM SELECTIVE EACH ADDITIONAL VESSEL  05/21/2021   IR EMBO VENOUS NOT HEMORR HEMANG  INC GUIDE ROADMAPPING  05/21/2021   IR US GUIDE VASC ACCESS RIGHT  05/21/2021   IR VENOGRAM RENAL UNI LEFT  05/21/2021   IRRIGATION AND DEBRIDEMENT FOOT Left 06/04/2022   Procedure: REVISIONAL TRAMSMETATARSAL AMPUTATION;  Surgeon: Felipa Furnace, DPM;  Location: [facility];  Service: Podiatry;  Laterality: Left;   RADIOLOGY WITH ANESTHESIA N/A 05/21/2021   Procedure: IR WITH ANESTHESIA;  Surgeon: Radiologist, Medication, MD;  Location: [facility];  Service: Radiology;  Laterality: N/A;   Toe amputations Bilateral    TUBAL LIGATION     WOUND DEBRIDEMENT Left 11/12/2021   Procedure: DEBRIDEMENT WOUND;FIRST METATARSAL RESECTION; APPLICATION OF SKIN GRAFT SUBSTITUTE;  Surgeon: Evelina Bucy, DPM;  Location: [facility];  Service: Podiatry;  Laterality: Left;   Social History   Occupational History   Not on file  Tobacco Use   Smoking status: Former    Types: Cigarettes   Smokeless tobacco: Never  Vaping Use   Vaping Use: Never used  Substance and Sexual Activity   Alcohol use: Yes    Comment: wine rare   Drug use: Never   Sexual activity: Not on file

## 2022-10-11 ENCOUNTER — Ambulatory Visit: Payer: Medicare Other | Admitting: Podiatrist

## 2022-11-30 ENCOUNTER — Ambulatory Visit: Payer: Medicare Other | Admitting: Family

## 2023-01-24 ENCOUNTER — Encounter: Payer: Self-pay | Admitting: Podiatry

## 2023-01-24 ENCOUNTER — Ambulatory Visit: Payer: Medicare Other | Admitting: Podiatry

## 2023-01-24 DIAGNOSIS — L97512 Non-pressure chronic ulcer of other part of right foot with fat layer exposed: Secondary | ICD-10-CM | POA: Diagnosis not present

## 2023-01-24 DIAGNOSIS — N1831 Chronic kidney disease, stage 3a: Secondary | ICD-10-CM | POA: Insufficient documentation

## 2023-01-24 DIAGNOSIS — L97412 Non-pressure chronic ulcer of right heel and midfoot with fat layer exposed: Secondary | ICD-10-CM

## 2023-01-24 DIAGNOSIS — E559 Vitamin D deficiency, unspecified: Secondary | ICD-10-CM | POA: Insufficient documentation

## 2023-01-24 DIAGNOSIS — Z89432 Acquired absence of left foot: Secondary | ICD-10-CM

## 2023-01-24 NOTE — Progress Notes (Signed)
  Subjective:  Patient ID: Ariel Arnold, female    DOB: 05-09-1956,   MRN: [id number]  Chief Complaint  Patient presents with   Foot Ulcer    RIGHT: wounds medial 1st MPJ and medial heel - home health has been wrapping 3 x weekly, gotten worse, history BKA left-asking about a prosthetic, also needing home health orders for dressings    67 y.o. female presents for concern of wounds on her right foot. Does have a history of BKA on the left. Relates the right toe wound devleoped several months ago and the heel wound presented as a blister that started several weeks ago. Home health has been dressing the wounds but have been getting worse. Denies any current pain . Denies any other pedal complaints. Denies n/v/f/c.   Past Medical History:  Diagnosis Date   Anemia    Anxiety    Aortic atherosclerosis ([place])    Arthritis    AVM (arteriovenous malformation) of colon    Depression    Essential hypertension    GI bleeding    Recurrent   Hepatic encephalopathy ([place])    History of kidney stones    History of RSV infection    Liver cirrhosis secondary to NASH ([place]) 2004   Biopsy-proven   Obesity    Osteomyelitis ([place])    Peripheral vascular disease (H[facility])    Thrombocytopenia ([facility])    Type 2 diabetes mellitus ([facility])     Objective:  Physical Exam: Vascular: DP/PT pulses 2/4 bilateral. CFT <3 seconds. Normal hair growth on digits. No edema.  Skin. No lacerations or abrasions bilateral feet.  Musculoskeletal: MMT 5/5 bilateral lower extremities in DF, PF, Inversion and Eversion. Deceased ROM in DF of ankle joint. BKA on left. Right medial first metatarsal with 2 cm x 2 cm x 0.2 with granular base. No erythema edema or purulence noted. Right medial heel wound 4 cm x 5 cm x 0.2 cm with granular base. No erythema edema or purulence noted.   Neurological: Sensation intact to light touch.   Assessment:   1. Ulcer of right foot with fat layer exposed ([place])   2. History of amputation of left foot  ([place])   3. Skin ulcer of right heel with fat layer exposed ([place])      Plan:  Patient was evaluated and treated and all questions answered. Ulcer right heel with fat layer exposed, ulcer right medial first metatarsal with fat layer exposed  -Debridement as below. -Dressed with betadine, DSD. -Off-loading with surgical shoe. -No abx indicated.  -Referral to wound care placed -Discussed glucose control and proper protein-rich diet.  -Discussed if any worsening redness, pain, fever or chills to call or may need to report to the emergency room. Patient expressed understanding.   Procedure: Excisional Debridement of Wound Rationale: Removal of non-viable soft tissue from the wound to promote healing.  Anesthesia: none Pre-Debridement Wound Measurements: 1.5 cm x 1.5 cm x 0.2 cm (medial 1st met) 4 cm x 5 cm x 0.1 cm (heel) Post-Debridement Wound Measurements: 2 cm x 2 cm x 2 cm, 4 cm x 5 cm x 2 cm (heel) Type of Debridement: Sharp Excisional Tissue Removed: Non-viable soft tissue Depth of Debridement: subcutaneous tissue. Technique: Sharp excisional debridement to bleeding, viable wound base.  Dressing: Dry, sterile, compression dressing. Disposition: Patient tolerated procedure well. Patient to return in 2  week for follow-up.  Return in about 4 weeks (around 02/21/2023).   Lorenda Peck, DPM

## 2023-01-30 ENCOUNTER — Telehealth: Payer: Self-pay | Admitting: *Deleted

## 2023-01-30 NOTE — Telephone Encounter (Signed)
Ariel Arnold w/ home health is calling to verify wound care orders from physician for patient. The patient showed paper to them and said that it was physician, could not make out physician's signature,will resend wound care referral, any additional information?please advise

## 2023-01-31 NOTE — Telephone Encounter (Signed)
Returned the call back to [employer], no answer but left a message of physician's recommendations and to call back if further questions. Will try again tomorrow.

## 2023-02-02 ENCOUNTER — Emergency Department (HOSPITAL_COMMUNITY)
Admission: EM | Admit: 2023-02-02 | Discharge: 2023-02-03 | Disposition: A | Discharge status: Home / Self Care | Payer: Medicare Other | Attending: Emergency Medicine | Admitting: Emergency Medicine

## 2023-02-02 ENCOUNTER — Emergency Department (HOSPITAL_COMMUNITY): Payer: Medicare Other

## 2023-02-02 DIAGNOSIS — L03115 Cellulitis of right lower limb: Secondary | ICD-10-CM | POA: Insufficient documentation

## 2023-02-02 DIAGNOSIS — Z794 Long term (current) use of insulin: Secondary | ICD-10-CM | POA: Diagnosis not present

## 2023-02-02 DIAGNOSIS — M7989 Other specified soft tissue disorders: Secondary | ICD-10-CM | POA: Diagnosis present

## 2023-02-02 DIAGNOSIS — E119 Type 2 diabetes mellitus without complications: Secondary | ICD-10-CM | POA: Diagnosis not present

## 2023-02-02 LAB — CBC WITH DIFFERENTIAL/PLATELET
Abs Immature Granulocytes: 0 10*3/uL (ref 0.00–0.07)
Basophils Absolute: 0 10*3/uL (ref 0.0–0.1)
Basophils Relative: 1 %
Eosinophils Absolute: 0.1 10*3/uL (ref 0.0–0.5)
Eosinophils Relative: 4 %
HCT: 29.5 % — ABNORMAL LOW (ref 36.0–46.0)
Hemoglobin: 9.6 g/dL — ABNORMAL LOW (ref 12.0–15.0)
Immature Granulocytes: 0 %
Lymphocytes Relative: 38 %
Lymphs Abs: 1.1 10*3/uL (ref 0.7–4.0)
MCH: 30.8 pg (ref 26.0–34.0)
MCHC: 32.5 g/dL (ref 30.0–36.0)
MCV: 94.6 fL (ref 80.0–100.0)
Monocytes Absolute: 0.2 10*3/uL (ref 0.1–1.0)
Monocytes Relative: 9 %
Neutro Abs: 1.4 10*3/uL — ABNORMAL LOW (ref 1.7–7.7)
Neutrophils Relative %: 48 %
Platelets: 81 10*3/uL — ABNORMAL LOW (ref 150–400)
RBC: 3.12 MIL/uL — ABNORMAL LOW (ref 3.87–5.11)
RDW: 15.9 % — ABNORMAL HIGH (ref 11.5–15.5)
WBC: 2.8 10*3/uL — ABNORMAL LOW (ref 4.0–10.5)
nRBC: 0 % (ref 0.0–0.2)

## 2023-02-02 LAB — COMPREHENSIVE METABOLIC PANEL
ALT: 24 U/L (ref 0–44)
AST: 35 U/L (ref 15–41)
Albumin: 2.7 g/dL — ABNORMAL LOW (ref 3.5–5.0)
Alkaline Phosphatase: 99 U/L (ref 38–126)
Anion gap: 6 (ref 5–15)
BUN: 15 mg/dL (ref 8–23)
CO2: 26 mmol/L (ref 22–32)
Calcium: 8.6 mg/dL — ABNORMAL LOW (ref 8.9–10.3)
Chloride: 102 mmol/L (ref 98–111)
Creatinine, Ser: 0.84 mg/dL (ref 0.44–1.00)
GFR, Estimated: >60 mL/min (ref >60)
Glucose, Bld: 219 mg/dL — ABNORMAL HIGH (ref 70–99)
Potassium: 3.7 mmol/L (ref 3.5–5.1)
Sodium: 134 mmol/L — ABNORMAL LOW (ref 135–145)
Total Bilirubin: 1.1 mg/dL (ref 0.3–1.2)
Total Protein: 6.2 g/dL — ABNORMAL LOW (ref 6.5–8.1)

## 2023-02-02 LAB — LACTIC ACID, PLASMA: Lactic Acid, Venous: 1.2 mmol/L (ref 0.5–1.9)

## 2023-02-02 MED ORDER — CEPHALEXIN 500 MG PO CAPS: 500.0000 mg | ORAL_CAPSULE | Freq: Three times a day (TID) | ORAL | 0 refills | Status: AC

## 2023-02-02 MED ORDER — SULFAMETHOXAZOLE-TRIMETHOPRIM 800-160 MG PO TABS: 1.0000 | ORAL_TABLET | Freq: Two times a day (BID) | ORAL | 0 refills | Status: AC

## 2023-02-02 MED ORDER — VANCOMYCIN HCL IN DEXTROSE 1-5 GM/200ML-% IV SOLN
1000.0000 mg | Freq: Once | INTRAVENOUS | Status: AC
  Administered 2023-02-02: 1000 mg via INTRAVENOUS
  Filled 2023-02-02: qty 200

## 2023-02-02 NOTE — ED Provider Triage Note (Signed)
Emergency Medicine Provider Triage Evaluation Note  Ariel Arnold , a 67 y.o. female  was evaluated in triage.  Pt complains of chronic right foot wounds. She has been seen by podiatry and recommended to see and wound specialist and Dr. Smith Robert. Family was concerned because they thought that it was redder and warmer than usual. No fever.   Review of Systems  Positive:  Negative:   Physical Exam  BP (!) 125/55   Pulse 71   Temp 97.8 F (36.6 C) (Oral)   Resp 16   SpO2 99%  Gen:   Awake, no distress   Resp:  Normal effort  MSK:   Moves extremities without difficulty  Other:  Exam limited d/t bandaging. Wounds see hte the base of the great toe and hte medial malleolus.   Medical Decision Making  Medically screening exam initiated at 9:15 PM.  Appropriate orders placed.  Ariel Arnold was informed that the remainder of the evaluation will be completed by another provider, this initial triage assessment does not replace that evaluation, and the importance of remaining in the ED until their evaluation is complete.  XR ordered and labs/    Ariel Arnold, Hershal Coria 02/02/23 2116

## 2023-02-02 NOTE — ED Provider Notes (Signed)
[facility] Provider Note   CSN: [id number] Arrival date & time: 02/02/23  1955     History  Chief Complaint  Patient presents with   Foot Pain    Ariel Arnold is a 67 y.o. female.  Patient with history of left BKA presenting for complaint of 2 right foot ulcerations for which she sees podiatry.  She notes that the foot ulcerations for a couple of months.  Patient denies fever or chills, has no nausea, vomiting, abdominal pain, chest pain, dyspnea.  Patient saw podiatry about 1 week ago, had debridement but was not started on antibiotics.  Relative reports that she has had worsening swelling and redness of the foot and ankle.  No streaking.  The worsening symptoms are what caused him to come to the ED.  The history is provided by the patient and a relative.       Home Medications Prior to Admission medications   Medication Sig Start Date End Date Taking? Authorizing Provider  ACCU-CHEK GUIDE test strip  08/27/21   [provider]  Accu-Chek Softclix Lancets lancets SMARTSIG:2 Topical Twice Daily 08/27/21   [provider]  albuterol (VENTOLIN HFA) 108 (90 Base) MCG/ACT inhaler Inhale 2 puffs into the lungs every 4 (four) hours as needed for shortness of breath or wheezing. 01/11/21   [provider]  Blood Glucose Monitoring Suppl (ACCU-CHEK GUIDE) w/Device KIT  08/27/21   [provider]  Calcium Carbonate-Vit D-Min (CALCIUM 1200) 1200-1000 MG-UNIT CHEW Chew 2 tablets by mouth daily.    [provider]  carvedilol (COREG) 6.25 MG tablet Take 1 tablet (6.25 mg total) by mouth 2 (two) times daily. 04/11/22 08/21/23  [name], MD  clotrimazole-betamethasone (LOTRISONE) cream Apply 1 Application topically daily as needed (for yeast/rash). 11/25/21   [provider]  diclofenac Sodium (VOLTAREN) 1 % GEL Apply 2 g topically in the morning and at bedtime. 12/02/21   [provider]   ferrous sulfate 325 (65 FE) MG tablet Take 325 mg by mouth in the morning and at bedtime.    [provider]  folic acid (FOLVITE) 1 MG tablet Take 1 mg by mouth 2 (two) times daily. 06/02/21   [provider]  furosemide (LASIX) 20 MG tablet Take 20 mg by mouth once a week. 05/27/21   [provider]  gabapentin (NEURONTIN) 600 MG tablet Take 600 mg by mouth 3 (three) times daily as needed (nerve pain). 07/23/21   [provider]  GLOBAL EASE INJECT PEN NEEDLES 32G X 4 MM MISC  08/27/21   [provider]  hydrOXYzine (ATARAX) 25 MG tablet Take 25 mg by mouth daily. 08/17/22   [provider]  insulin aspart (NOVOLOG) 100 UNIT/ML injection 0-20 Units, Subcutaneous, 3 times daily with meal CBG < 70: Implement Hypoglycemia measures CBG 70 - 120: 0 units CBG 121 - 150: 3 units CBG 151 - 200: 4 units CBG 201 - 250: 7 units CBG 251 - 300: 11 units CBG 301 - 350: 15 units CBG 351 - 400: 20 units CBG > 400: call MD 08/31/22   Jonetta Osgood, MD  ketorolac (ACULAR) 0.5 % ophthalmic solution Place 1 drop into both eyes 4 (four) times daily as needed (dry eyes). 09/06/21   [provider]  lactulose (CHRONULAC) 10 GM/15ML solution Take 15 mLs (10 g total) by mouth 2 (two) times daily. for ammonia levels 08/31/22   Ghimire, Henreitta Leber, MD  oxyCODONE (OXY IR/ROXICODONE) 5 MG immediate release tablet Take 1 tablet (5 mg total) by mouth every 6 (six) hours as needed for moderate pain (pain score 4-6). 09/13/22   Suzan Slick, NP  pantoprazole (PROTONIX) 40 MG tablet Take 40 mg by mouth daily as needed (heartburn). 05/27/21   [provider]  pravastatin (PRAVACHOL) 20 MG tablet Take 1 tablet (20 mg total) by mouth at bedtime. Take after completion of daptomycin 01/03/22   [name], [name], MD  rifaximin (XIFAXAN) 550 MG TABS tablet Take 1 tablet (550 mg total) by mouth 2 (two) times daily. 08/31/22   Ghimire, Henreitta Leber, MD  sertraline (ZOLOFT) 100 MG  tablet Take 2 tablets (200 mg total) by mouth daily. Patient taking differently: Take 150 mg by mouth daily. 06/08/22 08/21/23  [name], MD  spironolactone (ALDACTONE) 50 MG tablet Take 50 mg by mouth daily as needed (swelling). 05/27/21   [provider]  TOUJEO MAX SOLOSTAR 300 UNIT/ML Solostar Pen Inject 20 Units into the skin at bedtime. 08/31/22   Ghimire, Henreitta Leber, MD  vitamin C (ASCORBIC ACID) 500 MG tablet Take 500 mg by mouth 2 (two) times daily.    [provider]  Vitamin D, Ergocalciferol, (DRISDOL) 1.25 MG (50000 UNIT) CAPS capsule Take 1 capsule by mouth every 7 (seven) days. Mondays 04/05/21   [provider]      Allergies    Chlorhexidine and Tape    Review of Systems   Review of Systems  Constitutional:  Negative for chills and fever.  HENT:  Negative for ear pain and sore throat.   Eyes:  Negative for pain and visual disturbance.  Respiratory:  Negative for cough and shortness of breath.   Cardiovascular:  Negative for chest pain and palpitations.  Gastrointestinal:  Negative for abdominal pain and vomiting.  Genitourinary:  Negative for dysuria and hematuria.  Musculoskeletal:        Right foot ulceration   Skin:  Positive for wound. Negative for color change and rash.       Ulcerations of the right foot  Neurological:  Negative for seizures and syncope.  All other systems reviewed and are negative.   Physical Exam Updated Vital Signs BP (!) 125/55   Pulse 71   Temp 97.8 F (36.6 C) (Oral)   Resp 16   SpO2 99%  Physical Exam Vitals and nursing note reviewed.  Constitutional:      General: She is not in acute distress.    Appearance: She is well-developed.  HENT:     Head: Normocephalic and atraumatic.  Eyes:     Conjunctiva/sclera: Conjunctivae normal.  Cardiovascular:     Rate and Rhythm: Normal rate and regular rhythm.     Heart sounds: No murmur heard. Pulmonary:     Effort: Pulmonary effort is normal. No respiratory  distress.     Breath sounds: Normal breath sounds.  Abdominal:     Palpations: Abdomen is soft.     Tenderness: There is no abdominal tenderness.  Musculoskeletal:        General: No swelling.     Cervical back: Neck supple.       Feet:     Comments: Ulcerations present per photo Mild purulent drainage with bandage strikethrough  Skin:    General: Skin is warm and dry.     Capillary Refill: Capillary refill takes less than 2 seconds.  Neurological:     Mental Status: She is alert.  Psychiatric:  Mood and Affect: Mood normal.        ED Results / Procedures / Treatments   Labs (all labs ordered are listed, but only abnormal results are displayed) Labs Reviewed  CBC WITH DIFFERENTIAL/PLATELET - Abnormal; Notable for the following components:      Result Value   WBC 2.8 (*)    RBC 3.12 (*)    Hemoglobin 9.6 (*)    HCT 29.5 (*)    RDW 15.9 (*)    Platelets 81 (*)    Neutro Abs 1.4 (*)    All other components within normal limits  COMPREHENSIVE METABOLIC PANEL - Abnormal; Notable for the following components:   Sodium 134 (*)    Glucose, Bld 219 (*)    Calcium 8.6 (*)    Total Protein 6.2 (*)    Albumin 2.7 (*)    All other components within normal limits  LACTIC ACID, PLASMA  LACTIC ACID, PLASMA    EKG None  Radiology DG Ankle Complete Right  Result Date: 02/02/2023 CLINICAL DATA:  Wounds EXAM: RIGHT ANKLE - COMPLETE 3+ VIEW COMPARISON:  None Available. FINDINGS: No fracture or malalignment. No frank osseous destructive change. Generalized soft tissue swelling without emphysema. Chronic calcification posteriorly at the distal lower leg. IMPRESSION: Generalized soft tissue swelling. No acute osseous abnormality. Electronically Signed   By: Donavan Foil M.D.   On: 02/02/2023 21:36   DG Foot Complete Right  Result Date: 02/02/2023 CLINICAL DATA:  Wound Lister's EXAM: RIGHT FOOT COMPLETE - 3+ VIEW COMPARISON:  01/18/2022, toe x-ray 12/10/2020 FINDINGS:  Assessment of the distal phalanges of the second through fifth digits is limited by flexed positioning of the toes. Previous amputation first digit at the level of the proximal phalanx. No definite erosions or osseous destructive change. Ulcer plantar aspect of the foot at the level of the first metatarsal. No soft tissue emphysema. Diffuse soft tissue swelling. Plantar calcaneal spur with adjacent plantar calcifications IMPRESSION: 1. Previous amputation of the first digit. No definite radiographic evidence for osteomyelitis. 2. Diffuse soft tissue swelling. Ulcer plantar aspect of the foot at the level of the first metatarsal. Electronically Signed   By: Donavan Foil M.D.   On: 02/02/2023 21:35    Procedures Procedures    Medications Ordered in ED Medications  vancomycin (VANCOCIN) IVPB 1000 mg/200 mL premix (has no administration in time range)    ED Course/ Medical Decision Making/ A&P                             Medical Decision Making Medical Decision Making:   JAHKAYLA BARRERO is a 67 y.o. female who presented to the ED today with worsening swelling and erythema with 2 ulcerations that have been chronic detailed above.    Additional history discussed with patient's family/caregivers.  Complete initial physical exam performed.    Reviewed and confirmed nursing documentation for past medical history, family history, social history.    Initial Assessment:   With the patient's presentation of increased swelling and erythema, most likely diagnosis is cellular. Other diagnoses were considered including (but not limited to) osteomyelitis, bacteremia, without evidence of DVT. These are considered less likely due to history of present illness and physical exam findings.  No evidence of nec fasc, no abscess, hematoma, or dermatitis.  This is most consistent with an acute complicated illness  Initial Plan:  Patient had CBC, lactate, CMP ordered.   Initial Study Results:  Laboratory  All  laboratory results reviewed without evidence of clinically relevant pathology.   Patient has no evidence of leukocytosis with normal lactic acid.  Radiology:  All images reviewed independently. Agree with radiology report at this time.   DG Ankle Complete Right Generalized soft tissue swelling. No acute osseous abnormality.   DG Foot Complete Right IMPRESSION: 1. Previous amputation of the first digit. No definite radiographic evidence for osteomyelitis. 2. Diffuse soft tissue swelling. Ulcer plantar aspect of the foot at the level of the first metatarsal.    Final Assessment and Plan:   Patient has what appears to be cellulitis on top of chronic ulceration secondary to uncontrolled diabetes.  She has no evidence of leukocytosis or elevation in lactic acid.  She is hemodynamically stable, afebrile.  X-ray showed no evidence of osteomyelitis or abscess.  She can continue with routine wound care, will have her receive IV vancomycin in the ED and sent home with oral antibiotics.  She has yet to fail outpatient antibiotic therapy.  Strict return precautions provided, instructed her to see her orthopedist ASAP.  Patient verbalized understanding.   Clinical Impression: Cellulitis  Discharge    Risk Prescription drug management.          Final Clinical Impression(s) / ED Diagnoses Final diagnoses:  Cellulitis of right lower extremity    Rx / DC Orders ED Discharge Orders     None         Erskine Emery, MD 02/02/23 2258    Drenda Freeze, MD 02/04/23 1459

## 2023-02-02 NOTE — Discharge Instructions (Addendum)
Please complete the Bactrim, take twice a day starting tomorrow and for 7 days   Take the Keflex as well three times a day for seven days   You need to follow up with Dr. Sharol Given and your podiatrist  Keep wound clean and dry, keep covered in clean bandages with regular changes

## 2023-02-02 NOTE — ED Notes (Signed)
Attempted IV x2 without success  

## 2023-02-02 NOTE — ED Triage Notes (Signed)
Patient arrived with complaints of blisters on her right foot she has been followed by a specialist for, family states she noticed it feeling warm and concerned for possible infection.

## 2023-02-02 NOTE — ED Notes (Signed)
Will perform [place] PIV shortly.

## 2023-02-07 ENCOUNTER — Ambulatory Visit (INDEPENDENT_AMBULATORY_CARE_PROVIDER_SITE_OTHER): Payer: Medicare Other | Admitting: Family

## 2023-02-07 ENCOUNTER — Encounter: Payer: Self-pay | Admitting: Family

## 2023-02-07 DIAGNOSIS — L97511 Non-pressure chronic ulcer of other part of right foot limited to breakdown of skin: Secondary | ICD-10-CM | POA: Diagnosis not present

## 2023-02-07 DIAGNOSIS — L97411 Non-pressure chronic ulcer of right heel and midfoot limited to breakdown of skin: Secondary | ICD-10-CM | POA: Diagnosis not present

## 2023-02-08 ENCOUNTER — Encounter: Payer: Self-pay | Admitting: Family

## 2023-02-08 NOTE — Progress Notes (Signed)
Office Visit Note   Patient: Ariel Arnold           Date of Birth: 08-10-1956           MRN: [id number] Visit Date: 02/07/2023              Requested by: Jacqualine Code, [place] [address] PCP: Jacqualine Code, DO  No chief complaint on file.     HPI: The patient is a 67 year old woman for evaluation of foot ulcers x 2 to the right foot these have been ongoing for several months but unfortunately have been stagnating.  She is status post below-knee amputation on the left  Did have an ED visit for the same on February 15 concern for infection especially to the heel ulcer.  She has had ulcer to the tip of the great toe for quite some time has also developed a heel ulcer she feels this is from propelling herself in her wheelchair with her heel  Assessment & Plan: Visit Diagnoses: No diagnosis found.  Plan: Continue daily Dial soap cleansing dry dressings will complete her course of antibiotics as prescribed.  Was given Bactrim and Keflex.  Discussed keeping the wound bed moist if she is not having significant drainage may begin using Silvadene or antibacterial ointment off the heel.    Follow-Up Instructions: No follow-ups on file.   Ortho Exam  Patient is alert, oriented, no adenopathy, well-dressed, normal affect, normal respiratory effort. On examination of the right foot there is 2+ pitting edema to the right lower extremity without weeping or erythema.  No hemosiderin staining.  She does have a posterior heel ulcer to the posterior medial aspect with granulation there is no tunneling or undermining no eschar this is filled in with granulation is measuring 5 cm in diameter.  She has a ulcer as well to the tip of the great toe which is 15 mm in diameter 1 mm of depth filled in with 100% granulation there is no erythema warmth no sign of infection  Imaging: No results found. No images are attached to the encounter.  Labs: Lab Results   Component Value Date   HGBA1C 11.2 (H) 04/03/2022   HGBA1C 9.4 (H) 11/10/2021   ESRSEDRATE 11 02/07/2022   ESRSEDRATE 21 11/09/2021   CRP 6.0 (H) 08/19/2022   CRP 4.7 02/07/2022   CRP 0.9 11/09/2021   REPTSTATUS 08/25/2022 FINAL 08/20/2022   REPTSTATUS 08/25/2022 FINAL 08/20/2022   GRAMSTAIN  06/04/2022    NO SQUAMOUS EPITHELIAL CELLS SEEN RARE WBC SEEN NO ORGANISMS SEEN    CULT  08/20/2022    NO GROWTH 5 DAYS Performed at [facility], [place] [address], [place], [place] [address]    CULT  08/20/2022    NO GROWTH 5 DAYS Performed at [facility] [address], [place], [place] [address]    LABORGA METHICILLIN RESISTANT STAPHYLOCOCCUS AUREUS 06/04/2022     Lab Results  Component Value Date   ALBUMIN 2.7 (L) 02/02/2023   ALBUMIN 2.1 (L) 08/31/2022   ALBUMIN 2.0 (L) 08/22/2022    Lab Results  Component Value Date   MG 1.8 08/24/2022   MG 1.7 08/22/2022   MG 1.6 (L) 11/13/2021   No results found for: "VD25OH"  No results found for: "PREALBUMIN"    Latest Ref Rng & Units 02/02/2023    9:14 PM 08/31/2022    3:09 AM 08/30/2022    2:55 AM  CBC EXTENDED  WBC 4.0 - 10.5 K/uL 2.8  5.8  2.9   RBC 3.87 - 5.11 MIL/uL 3.12  2.67  2.61   Hemoglobin 12.0 - 15.0 g/dL 9.6  8.6  8.4   HCT 36.0 - 46.0 % 29.5  25.0  25.2   Platelets 150 - 400 K/uL 81  69  64   NEUT# 1.7 - 7.7 K/uL 1.4     Lymph# 0.7 - 4.0 K/uL 1.1        There is no height or weight on file to calculate BMI.  Orders:  No orders of the defined types were placed in this encounter.  No orders of the defined types were placed in this encounter.    Procedures: No procedures performed  Clinical Data: No additional findings.  ROS:  All other systems negative, except as noted in the HPI. Review of Systems  Objective: Vital Signs: There were no vitals taken for this visit.  Specialty Comments:  No specialty comments available.  PMFS History: Patient Active Problem List   Diagnosis  Date Noted   CKD stage G3a/A3, GFR 45-59 and albumin creatinine ratio >300 mg/g (HCC) 01/24/2023   Vitamin D deficiency 01/24/2023   Diabetic osteomyelitis ([place])    MRSA bacteremia 06/06/2022   Secondary esophageal varices with bleeding (HCC)    Cellulitis of left foot 06/02/2022   Ascites 04/05/2022   Cellulitis 04/02/2022   Lactic acidosis 04/02/2022   Chronic osteomyelitis involving ankle and foot, left ([place]) 11/09/2021   Essential hypertension 11/09/2021   Hematemesis    Liver cirrhosis secondary to NASH ([place]) 05/19/2021   Thrombocytopenia ([place]) 05/19/2021   Obesity (BMI 30-39.9) 02/15/2021   Acquired absence of left foot ([place]) 01/11/2021   Non-healing wound of amputation stump ([place]) 09/09/2020   Coronary atherosclerosis 08/04/2020   Non-prs chronic ulcer oth prt right foot w fat layer exposed ([place]) 12/24/2019   Acquired absence of other left toe(s) ([place]) 11/19/2019   COVID-19 08/20/2019   Difficulty in walking, not elsewhere classified 08/09/2019   History of peptic ulcer disease 08/09/2019   Muscle weakness (generalized) 08/09/2019   Pain in left ankle and joints of left foot 08/09/2019   Unsteadiness on feet 08/09/2019   Gastro-esophageal reflux disease without esophagitis 08/08/2019   History of urinary stone 08/08/2019   Hypomagnesemia 08/08/2019   Long term (current) use of insulin ([place]) 08/08/2019   Major depressive disorder, single episode, unspecified 08/08/2019   Peripheral vascular disease ([place]) 08/08/2019   Type 2 diabetes mellitus with diabetic neuropathy, unspecified ([place]) 08/08/2019   Stage 2 chronic kidney disease due to type 2 diabetes mellitus ([place]) [id number]   Nonalcoholic fatty liver disease 05/07/2019   Type 2 diabetes mellitus treated with insulin ([place]) 05/06/2019   Depressive disorder 04/18/2019   Generalized anxiety disorder 04/18/2019   Seasonal allergic rhinitis 04/18/2019   Arthritis 04/18/2019   Mixed hyperlipidemia 12/19/2018   Past Medical  History:  Diagnosis Date   Anemia    Anxiety    Aortic atherosclerosis ([place])    Arthritis    AVM (arteriovenous malformation) of colon    Depression    Essential hypertension    GI bleeding    Recurrent   Hepatic encephalopathy ([place])    History of kidney stones    History of RSV infection    Liver cirrhosis secondary to NASH ([place]) 2004   Biopsy-proven   Obesity    Osteomyelitis ([place])    Peripheral vascular disease (HCC)    Thrombocytopenia (HCC)    Type  2 diabetes mellitus ([place])     Family History  Problem Relation Age of Onset   Heart failure Mother    Hyperlipidemia Mother    Diabetes Mellitus II Mother    Heart failure Father    Hyperlipidemia Father     Past Surgical History:  Procedure Laterality Date   ABDOMINAL HYSTERECTOMY     AMPUTATION Left 04/05/2022   Procedure: Metatarsal amputation revision;  Surgeon: Lorenda Peck, MD;  Location: [facility];  Service: Podiatry;  Laterality: Left;  Surgical team to do block   AMPUTATION Left 08/26/2022   Procedure: LEFT BELOW KNEE AMPUTATION;  Surgeon: Newt Minion, MD;  Location: [facility];  Service: Orthopedics;  Laterality: Left;   APPLICATION OF WOUND VAC Left 08/26/2022   Procedure: APPLICATION OF WOUND VAC;  Surgeon: Newt Minion, MD;  Location: [facility];  Service: Orthopedics;  Laterality: Left;   COLONOSCOPY     Cystoscopy with ureteral stent     ESOPHAGOGASTRODUODENOSCOPY     ESOPHAGOGASTRODUODENOSCOPY (EGD) WITH PROPOFOL N/A 05/20/2021   Procedure: ESOPHAGOGASTRODUODENOSCOPY (EGD) WITH PROPOFOL;  Surgeon: Daneil Dolin, MD;  [facility];  Service: Endoscopy;  Laterality: N/A;  needs intubation   IR ANGIOGRAM FOLLOW UP STUDY  05/21/2021   IR ANGIOGRAM SELECTIVE EACH ADDITIONAL VESSEL  05/21/2021   IR EMBO VENOUS NOT HEMORR HEMANG  INC GUIDE ROADMAPPING  05/21/2021   IR US GUIDE VASC ACCESS RIGHT  05/21/2021   IR VENOGRAM RENAL UNI LEFT  05/21/2021   IRRIGATION AND DEBRIDEMENT FOOT Left 06/04/2022   Procedure: REVISIONAL  TRAMSMETATARSAL AMPUTATION;  Surgeon: Felipa Furnace, DPM;  Location: [facility];  Service: Podiatry;  Laterality: Left;   RADIOLOGY WITH ANESTHESIA N/A 05/21/2021   Procedure: IR WITH ANESTHESIA;  Surgeon: Radiologist, Medication, MD;  Location: [facility];  Service: Radiology;  Laterality: N/A;   Toe amputations Bilateral    TUBAL LIGATION     WOUND DEBRIDEMENT Left 11/12/2021   Procedure: DEBRIDEMENT WOUND;FIRST METATARSAL RESECTION; APPLICATION OF SKIN GRAFT SUBSTITUTE;  Surgeon: Evelina Bucy, DPM;  Location[facility];  Service: Podiatry;  Laterality: Left;   Social History   Occupational History   Not on file  Tobacco Use   Smoking status: Former    Types: Cigarettes   Smokeless tobacco: Never  Vaping Use   Vaping Use: Never used  Substance and Sexual Activity   Alcohol use: Yes    Comment: wine rare   Drug use: Never   Sexual activity: Not on file

## 2023-02-20 ENCOUNTER — Ambulatory Visit: Payer: Medicare Other | Admitting: Podiatry

## 2023-02-20 ENCOUNTER — Encounter: Payer: Self-pay | Admitting: Podiatry

## 2023-02-20 DIAGNOSIS — L97511 Non-pressure chronic ulcer of other part of right foot limited to breakdown of skin: Secondary | ICD-10-CM

## 2023-02-20 DIAGNOSIS — Z89432 Acquired absence of left foot: Secondary | ICD-10-CM

## 2023-02-20 DIAGNOSIS — L97412 Non-pressure chronic ulcer of right heel and midfoot with fat layer exposed: Secondary | ICD-10-CM | POA: Diagnosis not present

## 2023-02-20 NOTE — Progress Notes (Signed)
  Subjective:  Patient ID: Ariel Arnold, female    DOB: November 08, 1956,   MRN: [id number]  Chief Complaint  Patient presents with   Foot Ulcer    4 week follow up right foot    67 y.o. female presents for concern of wounds on her right foot. Does have a history of BKA on the left. Relates she has not received a call from wound care yet. Denies any current pain . Denies any other pedal complaints. Denies n/v/f/c.   Past Medical History:  Diagnosis Date   Anemia    Anxiety    Aortic atherosclerosis ([place])    Arthritis    AVM (arteriovenous malformation) of colon    Depression    Essential hypertension    GI bleeding    Recurrent   Hepatic encephalopathy ([place])    History of kidney stones    History of RSV infection    Liver cirrhosis secondary to NASH ([place]) 2004   Biopsy-proven   Obesity    Osteomyelitis ([place])    Peripheral vascular disease (HCC)    Thrombocytopenia (HCC)    Type 2 diabetes mellitus (HCC)     Objective:  Physical Exam: Vascular: DP/PT pulses 2/4 bilateral. CFT <3 seconds. Normal hair growth on digits. No edema.  Skin. No lacerations or abrasions bilateral feet.  Musculoskeletal: MMT 5/5 bilateral lower extremities in DF, PF, Inversion and Eversion. Deceased ROM in DF of ankle joint. BKA on left. Right medial first metatarsal with 1 cm x 0.5 cm x 0.1 with granular base. No erythema edema or purulence noted. Right medial heel wound 4 cm x 3.5 cm x 0.2 cm with granular base. No erythema edema or purulence noted.   Neurological: Sensation intact to light touch.   Assessment:   1. Ulcer of right foot, limited to breakdown of skin ([place])   2. Skin ulcer of right heel with fat layer exposed ([place])   3. History of amputation of left foot ([place])       Plan:  Patient was evaluated and treated and all questions answered. Ulcer right heel with fat layer exposed, ulcer right medial first metatarsal with fat layer exposed  -Debridement as below. -Dressed with betadine,  DSD. -Off-loading with surgical shoe. Heel cushion provided.  -No abx indicated.  -Referral to wound care placed. -Discussed glucose control and proper protein-rich diet.  -Discussed if any worsening redness, pain, fever or chills to call or may need to report to the emergency room. Patient expressed understanding.   Procedure: Excisional Debridement of Wound Rationale: Removal of non-viable soft tissue from the wound to promote healing.  Anesthesia: none Pre-Debridement Wound Measurements: Overylying callus(medial 1st met) 4 cm x 3.5 cm x 0.1 cm (heel) Post-Debridement Wound Measurements: 0.5 cm x 1 cm x 0.11 cm, 4 cm x 3.5 cm x 2 cm (heel) Type of Debridement: Sharp Excisional Tissue Removed: Non-viable soft tissue Depth of Debridement: subcutaneous tissue. Technique: Sharp excisional debridement to bleeding, viable wound base.  Dressing: Dry, sterile, compression dressing. Disposition: Patient tolerated procedure well. Patient to return in 2  week for follow-up.  Return in about 4 weeks (around 03/20/2023) for wound check.   Lorenda Peck, DPM

## 2023-02-21 ENCOUNTER — Ambulatory Visit: Payer: Medicare Other | Admitting: Family

## 2023-02-22 ENCOUNTER — Ambulatory Visit: Payer: Medicare Other | Admitting: Family

## 2023-03-08 ENCOUNTER — Ambulatory Visit: Payer: Medicare Other | Admitting: Podiatry

## 2023-03-08 DIAGNOSIS — L97511 Non-pressure chronic ulcer of other part of right foot limited to breakdown of skin: Secondary | ICD-10-CM | POA: Diagnosis not present

## 2023-03-08 MED ORDER — DAKINS (1/2 STRENGTH) 0.25 % EX SOLN: 1.0000 | Freq: Every day | CUTANEOUS | 0 refills | Status: AC

## 2023-03-08 NOTE — Progress Notes (Signed)
  Subjective:  Patient ID: Ariel Arnold, female    DOB: Aug 21, 1956,   MRN: [id number]  Chief Complaint  Patient presents with   Foot Ulcer    67 y.o. female presents for concern of wounds on her right foot. Does have a history of BKA on the left. Relates she has not received a call from wound care yet. Denies any current pain . Denies any other pedal complaints. Denies n/v/f/c.   Past Medical History:  Diagnosis Date   Anemia    Anxiety    Aortic atherosclerosis ([place])    Arthritis    AVM (arteriovenous malformation) of colon    Depression    Essential hypertension    GI bleeding    Recurrent   Hepatic encephalopathy ([place])    History of kidney stones    History of RSV infection    Liver cirrhosis secondary to NASH ([place]) 2004   Biopsy-proven   Obesity    Osteomyelitis ([place])    Peripheral vascular disease (HCC)    Thrombocytopenia (HCC)    Type 2 diabetes mellitus (HCC)     Objective:  Physical Exam: Vascular: DP/PT pulses 2/4 bilateral. CFT <3 seconds. Normal hair growth on digits. No edema.  Skin. No lacerations or abrasions bilateral feet.  Musculoskeletal: MMT 5/5 bilateral lower extremities in DF, PF, Inversion and Eversion. Deceased ROM in DF of ankle joint. BKA on left. Right medial first metatarsal with 1 cm x 0.5 cm x 0.1 with granular base. No erythema edema or purulence noted. Right medial heel wound 4 cm x 3.5 cm x 0.2 cm with granular base. No erythema edema or purulence noted.   Neurological: Sensation intact to light touch.   Assessment:   No diagnosis found.     Plan:  Patient was evaluated and treated and all questions answered. Ulcer right heel with fat layer exposed, ulcer right medial first metatarsal with fat layer exposed  -Debridement as below. -Dressed with Dakin's solution to help to pseudomonal infection in the periwound -Off-loading with surgical shoe. Heel cushion provided.  -No abx indicated.  -Referral to wound care  placed. -Discussed glucose control and proper protein-rich diet.  -Discussed if any worsening redness, pain, fever or chills to call or may need to report to the emergency room. Patient expressed understanding.  -She has a wound care center appointment and appointment with Dr. Blenda Mounts next few weeks  Procedure: Excisional Debridement of Wound Rationale: Removal of non-viable soft tissue from the wound to promote healing.  Anesthesia: none Pre-Debridement Wound Measurements: Overylying callus(medial 1st met) 4 cm x 3.5 cm x 0.1 cm (heel) Post-Debridement Wound Measurements: 0.5 cm x 1 cm x 0.11 cm, 4 cm x 3.5 cm x 2 cm (heel) Type of Debridement: Sharp Excisional Tissue Removed: Non-viable soft tissue Depth of Debridement: subcutaneous tissue. Technique: Sharp excisional debridement to bleeding, viable wound base.  Dressing: Dry, sterile, compression dressing. Disposition: Patient tolerated procedure well. Patient to return in 2  week for follow-up.  No follow-ups on file.   Lorenda Peck, DPM

## 2023-03-10 ENCOUNTER — Telehealth: Payer: Self-pay | Admitting: *Deleted

## 2023-03-10 NOTE — Telephone Encounter (Signed)
Nurse with [facility] is requesting updated wound care orders for patient,please fax to [phone]. Faxed  last office visit notes to their office , attn: Kelly. 03/10/23

## 2023-03-20 ENCOUNTER — Ambulatory Visit (INDEPENDENT_AMBULATORY_CARE_PROVIDER_SITE_OTHER): Payer: Medicare Other | Admitting: Podiatry

## 2023-03-20 DIAGNOSIS — Z91199 Patient's noncompliance with other medical treatment and regimen due to unspecified reason: Secondary | ICD-10-CM

## 2023-03-21 NOTE — Progress Notes (Signed)
No show

## 2023-03-31 ENCOUNTER — Ambulatory Visit (HOSPITAL_BASED_OUTPATIENT_CLINIC_OR_DEPARTMENT_OTHER): Payer: Medicare Other | Admitting: General Surgery

## 2023-04-03 ENCOUNTER — Ambulatory Visit (INDEPENDENT_AMBULATORY_CARE_PROVIDER_SITE_OTHER): Payer: Medicare Other | Admitting: Podiatry

## 2023-04-03 ENCOUNTER — Telehealth: Payer: Self-pay | Admitting: Podiatry

## 2023-04-03 DIAGNOSIS — L97512 Non-pressure chronic ulcer of other part of right foot with fat layer exposed: Secondary | ICD-10-CM | POA: Diagnosis not present

## 2023-04-03 DIAGNOSIS — L97412 Non-pressure chronic ulcer of right heel and midfoot with fat layer exposed: Secondary | ICD-10-CM

## 2023-04-03 NOTE — Telephone Encounter (Signed)
Ariel Arnold left a message stating she would like a referral sent to Dr. Lajoyce Corners. She is scheduled to be seen with you today at 2:15pm.

## 2023-04-03 NOTE — Progress Notes (Signed)
  Subjective:  Patient ID: Ariel Arnold, female    DOB: 05-Nov-1956,   MRN: [id number]  Chief Complaint  Patient presents with   Foot Ulcer    67 y.o. female presents for concern of wounds on her right foot. Does have a history of BKA on the left. Relates she has not received a call from wound care yet. Denies any current pain . Denies any other pedal complaints. Denies n/v/f/c.   Past Medical History:  Diagnosis Date   Anemia    Anxiety    Aortic atherosclerosis (HCC)    Arthritis    AVM (arteriovenous malformation) of colon    Depression    Essential hypertension    GI bleeding    Recurrent   Hepatic encephalopathy (HCC)    History of kidney stones    History of RSV infection    Liver cirrhosis secondary to NASH (HCC) 2004   Biopsy-proven   Obesity    Osteomyelitis (HCC)    Peripheral vascular disease (HCC)    Thrombocytopenia (HCC)    Type 2 diabetes mellitus (HCC)     Objective:  Physical Exam: Vascular: DP/PT pulses 2/4 bilateral. CFT <3 seconds. Normal hair growth on digits. No edema.  Skin. No lacerations or abrasions bilateral feet.  Musculoskeletal: MMT 5/5 bilateral lower extremities in DF, PF, Inversion and Eversion. Deceased ROM in DF of ankle joint. BKA on left. Right medial first metatarsal with 1 cm x 0.5 cm x 0.2 with granular base. No erythema edema or purulence noted. Right medial heel wound 4 cm x 3.5 cm x 0.2 cm with granular base. No erythema edema or purulence noted.   Neurological: Sensation intact to light touch.   Assessment:   1. Skin ulcer of right heel with fat layer exposed   2. Ulcer of right foot with fat layer exposed        Plan:  Patient was evaluated and treated and all questions answered. Ulcer right heel with fat layer exposed, ulcer right medial first metatarsal with fat layer exposed  -Debridement as below. -Dressed with betadine, DSD. -Off-loading with surgical shoe. Heel cushion provided.  -No abx indicated.  -Follow-up  with wound care.  -Discussed glucose control and proper protein-rich diet.  -Discussed if any worsening redness, pain, fever or chills to call or may need to report to the emergency room. Patient expressed understanding.   Procedure: Excisional Debridement of Wound Rationale: Removal of non-viable soft tissue from the wound to promote healing.  Anesthesia: none Pre-Debridement Wound Measurements: Overylying callus(medial 1st met) 4 cm x 3.5 cm x 0.1 cm (heel) Post-Debridement Wound Measurements: 0.5 cm x 1 cm x 0.2 cm, 4 cm x 3.5 cm x 2 cm (heel) Type of Debridement: Sharp Excisional Tissue Removed: Non-viable soft tissue Depth of Debridement: subcutaneous tissue. Technique: Sharp excisional debridement to bleeding, viable wound base.  Dressing: Dry, sterile, compression dressing. Disposition: Patient tolerated procedure well. Patient to return in 2  week for follow-up.  Return in about 5 weeks (around 05/08/2023) for wound check.   Louann Sjogren, DPM

## 2023-04-04 ENCOUNTER — Ambulatory Visit: Payer: Medicare Other | Admitting: Orthopedic Surgery

## 2023-04-04 DIAGNOSIS — L97511 Non-pressure chronic ulcer of other part of right foot limited to breakdown of skin: Secondary | ICD-10-CM | POA: Diagnosis not present

## 2023-04-04 DIAGNOSIS — Z89512 Acquired absence of left leg below knee: Secondary | ICD-10-CM | POA: Diagnosis not present

## 2023-04-05 ENCOUNTER — Telehealth: Payer: Self-pay | Admitting: Orthopedic Surgery

## 2023-04-05 NOTE — Telephone Encounter (Signed)
Received call from [facility] -nurse with [facility] needing updated wound care orders faxed to her. The fax# is [phone]   The Ph# is [phone]

## 2023-04-05 NOTE — Telephone Encounter (Signed)
Note written that profore compression wrap and silvercel was applied to RLE and this will stay in place on 04/11/23 when she follows up in office.

## 2023-04-06 ENCOUNTER — Encounter: Payer: Self-pay | Admitting: Orthopedic Surgery

## 2023-04-06 ENCOUNTER — Telehealth: Payer: Self-pay

## 2023-04-06 NOTE — Telephone Encounter (Signed)
BI submitted for in office graft. Pt will follow up next week and will enroll in registry. Will hold this message pending approval.

## 2023-04-06 NOTE — Progress Notes (Signed)
Office Visit Note   Patient: Ariel Arnold           Date of Birth: [birth year]           MRN: [id number] Visit Date: 04/04/2023              Requested by: Joaquin Courts, DO [address] [place],  [place] [address] PCP: Joaquin Courts, DO  Chief Complaint  Patient presents with   Right Leg - Follow-up    Hx right BKA 08/26/2022   Left Foot - Open Wound      HPI: Patient is a 67 year old woman who presents for 2 separate issues.  Patient is status post a left transtibial amputation September 2023 with persistent swelling.  Patient also has a right heel ulcer patient states she has just completed a course of antibiotics.  She occasionally takes Lasix.  Assessment & Plan: Visit Diagnoses:  1. Ulcer of toe of right foot, limited to breakdown of skin   2. History of left below knee amputation     Plan: Patient has significant venous stasis and lymphatic insufficiency with swelling of the right leg.  Recommended compression.  Will apply a Dynaflex compression wrap today and request authorization for Kerecis tissue graft for the chronic venous insufficiency ulcer medial right heel.  Reevaluate in 1 week.  Follow-Up Instructions: Return in about 1 week (around 04/11/2023).   Ortho Exam  Patient is alert, oriented, no adenopathy, well-dressed, normal affect, normal respiratory effort. Examination patient has a large venous stasis insufficiency ulcer medial aspect of the right shin.  She is currently wearing her shrinker does have some swelling from the venous and lymphatic insufficiency.  Heel.  Patient has pitting edema with brawny skin color changes clear weeping edema.  Right calf measures 46 cm in circumference.  The medial heel venous stasis insufficiency ulcer measures 3 x 5 cm with healthy granulation tissue.   Stable left transtibial  Imaging: No results found.   Labs: Lab Results  Component Value Date   HGBA1C 11.2 (H) 04/03/2022   HGBA1C 9.4 (H)  11/10/2021   ESRSEDRATE 11 02/07/2022   ESRSEDRATE 21 11/09/2021   CRP 6.0 (H) 08/19/2022   CRP 4.7 02/07/2022   CRP 0.9 11/09/2021   REPTSTATUS 08/25/2022 FINAL 08/20/2022   REPTSTATUS 08/25/2022 FINAL 08/20/2022   GRAMSTAIN  06/04/2022    NO SQUAMOUS EPITHELIAL CELLS SEEN RARE WBC SEEN NO ORGANISMS SEEN    CULT  08/20/2022    NO GROWTH 5 DAYS Performed at [facility], [address] [place], [place] [address]    CULT  08/20/2022    NO GROWTH 5 DAYS Performed at [facility], [address], [place], [place] [address]    LABORGA METHICILLIN RESISTANT STAPHYLOCOCCUS AUREUS 06/04/2022     Lab Results  Component Value Date   ALBUMIN 2.7 (L) 02/02/2023   ALBUMIN 2.1 (L) 08/31/2022   ALBUMIN 2.0 (L) 08/22/2022    Lab Results  Component Value Date   MG 1.8 08/24/2022   MG 1.7 08/22/2022   MG 1.6 (L) 11/13/2021   No results found for: "VD25OH"  No results found for: "PREALBUMIN"    Latest Ref Rng & Units 02/02/2023    9:14 PM 08/31/2022    3:09 AM 08/30/2022    2:55 AM  CBC EXTENDED  WBC 4.0 - 10.5 K/uL 2.8  5.8  2.9   RBC 3.87 - 5.11 MIL/uL 3.12  2.67  2.61   Hemoglobin  12.0 - 15.0 g/dL 9.6  8.6  8.4   HCT 16.1 - 46.0 % 29.5  25.0  25.2   Platelets 150 - 400 K/uL 81  69  64   NEUT# 1.7 - 7.7 K/uL 1.4     Lymph# 0.7 - 4.0 K/uL 1.1        There is no height or weight on file to calculate BMI.  Orders:  No orders of the defined types were placed in this encounter.  No orders of the defined types were placed in this encounter.    Procedures: No procedures performed  Clinical Data: No additional findings.  ROS:  All other systems negative, except as noted in the HPI. Review of Systems  Objective: Vital Signs: There were no vitals taken for this visit.  Specialty Comments:  No specialty comments available.  PMFS History: Patient Active Problem List   Diagnosis Date Noted   CKD stage G3a/A3, GFR 45-59 and albumin creatinine ratio  >300 mg/g 01/24/2023   Vitamin D deficiency 01/24/2023   Diabetic osteomyelitis    MRSA bacteremia 06/06/2022   Secondary esophageal varices with bleeding    Cellulitis of left foot 06/02/2022   Ascites 04/05/2022   Cellulitis 04/02/2022   Lactic acidosis 04/02/2022   Chronic osteomyelitis involving ankle and foot, left 11/09/2021   Essential hypertension 11/09/2021   Hematemesis    Liver cirrhosis secondary to NASH 05/19/2021   Thrombocytopenia 05/19/2021   Obesity (BMI 30-39.9) 02/15/2021   Acquired absence of left foot 01/11/2021   Non-healing wound of amputation stump 09/09/2020   Coronary atherosclerosis 08/04/2020   Non-prs chronic ulcer oth prt right foot w fat layer exposed 12/24/2019   Acquired absence of other left toe(s) 11/19/2019   COVID-19 08/20/2019   Difficulty in walking, not elsewhere classified 08/09/2019   History of peptic ulcer disease 08/09/2019   Muscle weakness (generalized) 08/09/2019   Pain in left ankle and joints of left foot 08/09/2019   Unsteadiness on feet 08/09/2019   Gastro-esophageal reflux disease without esophagitis 08/08/2019   History of urinary stone 08/08/2019   Hypomagnesemia 08/08/2019   Long term (current) use of insulin 08/08/2019   Major depressive disorder, single episode, unspecified 08/08/2019   Peripheral vascular disease 08/08/2019   Type 2 diabetes mellitus with diabetic neuropathy, unspecified 08/08/2019   Stage 2 chronic kidney disease due to type 2 diabetes mellitus 08/01/2019   Nonalcoholic fatty liver disease 05/07/2019   Type 2 diabetes mellitus treated with insulin 05/06/2019   Depressive disorder 04/18/2019   Generalized anxiety disorder 04/18/2019   Seasonal allergic rhinitis 04/18/2019   Arthritis 04/18/2019   Mixed hyperlipidemia 12/19/2018   Past Medical History:  Diagnosis Date   Anemia    Anxiety    Aortic atherosclerosis    Arthritis    AVM (arteriovenous malformation) of colon    Depression     Essential hypertension    GI bleeding    Recurrent   Hepatic encephalopathy    History of kidney stones    History of RSV infection    Liver cirrhosis secondary to NASH 2004   Biopsy-proven   Obesity    Osteomyelitis    Peripheral vascular disease    Thrombocytopenia    Type 2 diabetes mellitus     Family History  Problem Relation Age of Onset   Heart failure Mother    Hyperlipidemia Mother    Diabetes Mellitus II Mother    Heart failure Father    Hyperlipidemia Father  Past Surgical History:  Procedure Laterality Date   ABDOMINAL HYSTERECTOMY     AMPUTATION Left 04/05/2022   Procedure: Metatarsal amputation revision;  Surgeon: Louann Sjogren, MD;  Location: [facility];  Service: Podiatry;  Laterality: Left;  Surgical team to do block   AMPUTATION Left 08/26/2022   Procedure: LEFT BELOW KNEE AMPUTATION;  Surgeon: Nadara Mustard, MD;  Location: [facility] OR;  Service: Orthopedics;  Laterality: Left;   APPLICATION OF WOUND VAC Left 08/26/2022   Procedure: APPLICATION OF WOUND VAC;  Surgeon: Nadara Mustard, MD;  Location: [facility] OR;  Service: Orthopedics;  Laterality: Left;   COLONOSCOPY     Cystoscopy with ureteral stent     ESOPHAGOGASTRODUODENOSCOPY     ESOPHAGOGASTRODUODENOSCOPY (EGD) WITH PROPOFOL N/A 05/20/2021   Procedure: ESOPHAGOGASTRODUODENOSCOPY (EGD) WITH PROPOFOL;  Surgeon: Corbin Ade, MD;  [facility];  Service: Endoscopy;  Laterality: N/A;  needs intubation   IR ANGIOGRAM FOLLOW UP STUDY  05/21/2021   IR ANGIOGRAM SELECTIVE EACH ADDITIONAL VESSEL  05/21/2021   IR EMBO VENOUS NOT HEMORR HEMANG  INC GUIDE ROADMAPPING  05/21/2021   IR US GUIDE VASC ACCESS RIGHT  05/21/2021   IR VENOGRAM RENAL UNI LEFT  05/21/2021   IRRIGATION AND DEBRIDEMENT FOOT Left 06/04/2022   Procedure: REVISIONAL TRAMSMETATARSAL AMPUTATION;  Surgeon: Candelaria Stagers, DPM;  Location: [facility];  Service: Podiatry;  Laterality: Left;   RADIOLOGY WITH ANESTHESIA N/A 05/21/2021   Procedure: IR WITH ANESTHESIA;   Surgeon: Radiologist, Medication, MD;  Location: [facility];  Service: Radiology;  Laterality: N/A;   Toe amputations Bilateral    TUBAL LIGATION     WOUND DEBRIDEMENT Left 11/12/2021   Procedure: DEBRIDEMENT WOUND;FIRST METATARSAL RESECTION; APPLICATION OF SKIN GRAFT SUBSTITUTE;  Surgeon: Park Liter, DPM;  Location: [facility];  Service: Podiatry;  Laterality: Left;   Social History   Occupational History   Not on file  Tobacco Use   Smoking status: Former    Types: Cigarettes   Smokeless tobacco: Never  Vaping Use   Vaping Use: Never used  Substance and Sexual Activity   Alcohol use: Yes    Comment: wine rare   Drug use: Never   Sexual activity: Not on file

## 2023-04-11 ENCOUNTER — Ambulatory Visit: Payer: Medicare Other | Admitting: Orthopedic Surgery

## 2023-04-11 DIAGNOSIS — I83018 Varicose veins of right lower extremity with ulcer other part of lower leg: Secondary | ICD-10-CM

## 2023-04-11 DIAGNOSIS — L97411 Non-pressure chronic ulcer of right heel and midfoot limited to breakdown of skin: Secondary | ICD-10-CM

## 2023-04-11 DIAGNOSIS — L97811 Non-pressure chronic ulcer of other part of right lower leg limited to breakdown of skin: Secondary | ICD-10-CM

## 2023-04-12 ENCOUNTER — Other Ambulatory Visit: Payer: Self-pay

## 2023-04-12 ENCOUNTER — Telehealth: Payer: Self-pay | Admitting: Orthopedic Surgery

## 2023-04-12 NOTE — Telephone Encounter (Signed)
I faxed information to [facility] prior auth clinical information review. Will hold this message pending result. Documented in the BI portal as well.

## 2023-04-12 NOTE — Telephone Encounter (Signed)
I called for the fax number [phone] the chart note has not yet been dictated but I advised that we have applied a multilayer compression dressing and that this will remain intact until the pt's appt next week. I will hold this message and fax over the dictation once it is complete.

## 2023-04-12 NOTE — Telephone Encounter (Signed)
[facility] asking if they can receive visit notes and any changes in patients treatment. Please adviseClois Dupes) [phone]

## 2023-04-16 ENCOUNTER — Encounter: Payer: Self-pay | Admitting: Orthopedic Surgery

## 2023-04-16 NOTE — Progress Notes (Signed)
Office Visit Note   Patient: Ariel Arnold           Date of Birth: 08-29-1956           MRN: [id number] Visit Date: 04/11/2023              Requested by: Joaquin Courts, DO [address] [place],  [place] [address] PCP: Joaquin Courts, DO  Chief Complaint  Patient presents with   Right Foot - Follow-up    Enrollment for registry subject #14 visit #1      HPI: Patient is a 67 year old woman who presents with a venous ulcer right medial heel.  Patient has been in a Dynaflex wrap this past week.  The dressing is slipped down and there is increased swelling about the dressing.  Patient states she has burning and pain that she rates as an 8 out of 10.  Assessment & Plan: Visit Diagnoses:  1. Ulcer of right heel, limited to breakdown of skin (HCC)   2. Venous stasis ulcer of other part of right lower leg limited to breakdown of skin with varicose veins (HCC)     Plan: Donated Kerecis graft was applied we are investigating billing approval.  Will evaluate patient's pain after application of the donated graft.  Compression 3 layer wrap applied.  Follow-Up Instructions: Return in about 1 week (around 04/18/2023).   Ortho Exam  Patient is alert, oriented, no adenopathy, well-dressed, normal affect, normal respiratory effort. Examination patient has a palpable pulse.  There is improved skin wrinkling in the leg.  The medial ulcer measures 4 x 1.5 cm.  The wound healing has stalled, the wound bed has healthy granulation tissue, and patient presents for evaluation and application of Kerecis MariGen Micro graft. After informed consent a 10 blade knife was used to debride the skin and soft tissue to healthy viable bleeding granulation tissue.  Silver nitrate was used for hemostasis. The wound measures: 4 cm in length, 1.5cm  in width, 1 mm in depth, wound location medial right heel Donated Kerecis MariGen micro tissue graft 4 cm2 was applied, and there was no wastage.   Please see the photo below of the Lot number and expiration date. The micro tissue graft was covered with a nonadherent Adaptic dressing, bolstered with 4 x 4 gauze and secured with a compression wrap.     Imaging: No results found.     Labs: Lab Results  Component Value Date   HGBA1C 11.2 (H) 04/03/2022   HGBA1C 9.4 (H) 11/10/2021   ESRSEDRATE 11 02/07/2022   ESRSEDRATE 21 11/09/2021   CRP 6.0 (H) 08/19/2022   CRP 4.7 02/07/2022   CRP 0.9 11/09/2021   REPTSTATUS 08/25/2022 FINAL 08/20/2022   REPTSTATUS 08/25/2022 FINAL 08/20/2022   GRAMSTAIN  06/04/2022    NO SQUAMOUS EPITHELIAL CELLS SEEN RARE WBC SEEN NO ORGANISMS SEEN    CULT  08/20/2022    NO GROWTH 5 DAYS Performed at [facility], [address], [place], [place] [address]    CULT  08/20/2022    NO GROWTH 5 DAYS Performed at [facility], [address], [place], [place] [address]    LABORGA METHICILLIN RESISTANT STAPHYLOCOCCUS AUREUS 06/04/2022     Lab Results  Component Value Date   ALBUMIN 2.7 (L) 02/02/2023   ALBUMIN 2.1 (L) 08/31/2022   ALBUMIN 2.0 (L) 08/22/2022    Lab Results  Component Value Date   MG 1.8 08/24/2022   MG 1.7 08/22/2022  MG 1.6 (L) 11/13/2021   No results found for: "VD25OH"  No results found for: "PREALBUMIN"    Latest Ref Rng & Units 02/02/2023    9:14 PM 08/31/2022    3:09 AM 08/30/2022    2:55 AM  CBC EXTENDED  WBC 4.0 - 10.5 K/uL 2.8  5.8  2.9   RBC 3.87 - 5.11 MIL/uL 3.12  2.67  2.61   Hemoglobin 12.0 - 15.0 g/dL 9.6  8.6  8.4   HCT 82.9 - 46.0 % 29.5  25.0  25.2   Platelets 150 - 400 K/uL 81  69  64   NEUT# 1.7 - 7.7 K/uL 1.4     Lymph# 0.7 - 4.0 K/uL 1.1        There is no height or weight on file to calculate BMI.  Orders:  No orders of the defined types were placed in this encounter.  No orders of the defined types were placed in this encounter.    Procedures: No procedures performed  Clinical Data: No additional  findings.  ROS:  All other systems negative, except as noted in the HPI. Review of Systems  Objective: Vital Signs: There were no vitals taken for this visit.  Specialty Comments:  No specialty comments available.  PMFS History: Patient Active Problem List   Diagnosis Date Noted   CKD stage G3a/A3, GFR 45-59 and albumin creatinine ratio >300 mg/g (HCC) 01/24/2023   Vitamin D deficiency 01/24/2023   Diabetic osteomyelitis (HCC)    MRSA bacteremia 06/06/2022   Secondary esophageal varices with bleeding (HCC)    Cellulitis of left foot 06/02/2022   Ascites 04/05/2022   Cellulitis 04/02/2022   Lactic acidosis 04/02/2022   Chronic osteomyelitis involving ankle and foot, left (HCC) 11/09/2021   Essential hypertension 11/09/2021   Hematemesis    Liver cirrhosis secondary to NASH (HCC) 05/19/2021   Thrombocytopenia (HCC) 05/19/2021   Obesity (BMI 30-39.9) 02/15/2021   Acquired absence of left foot (HCC) 01/11/2021   Non-healing wound of amputation stump (HCC) 09/09/2020   Coronary atherosclerosis 08/04/2020   Non-prs chronic ulcer oth prt right foot w fat layer exposed (HCC) 12/24/2019   Acquired absence of other left toe(s) (HCC) 11/19/2019   COVID-19 08/20/2019   Difficulty in walking, not elsewhere classified 08/09/2019   History of peptic ulcer disease 08/09/2019   Muscle weakness (generalized) 08/09/2019   Pain in left ankle and joints of left foot 08/09/2019   Unsteadiness on feet 08/09/2019   Gastro-esophageal reflux disease without esophagitis 08/08/2019   History of urinary stone 08/08/2019   Hypomagnesemia 08/08/2019   Long term (current) use of insulin (HCC) 08/08/2019   Major depressive disorder, single episode, unspecified 08/08/2019   Peripheral vascular disease (HCC) 08/08/2019   Type 2 diabetes mellitus with diabetic neuropathy, unspecified (HCC) 08/08/2019   Stage 2 chronic kidney disease due to type 2 diabetes mellitus (HCC) 08/01/2019   Nonalcoholic fatty  liver disease 56/21/3086   Type 2 diabetes mellitus treated with insulin (HCC) 05/06/2019   Depressive disorder 04/18/2019   Generalized anxiety disorder 04/18/2019   Seasonal allergic rhinitis 04/18/2019   Arthritis 04/18/2019   Mixed hyperlipidemia 12/19/2018   Past Medical History:  Diagnosis Date   Anemia    Anxiety    Aortic atherosclerosis (HCC)    Arthritis    AVM (arteriovenous malformation) of colon    Depression    Essential hypertension    GI bleeding    Recurrent   Hepatic encephalopathy (HCC)    History  of kidney stones    History of RSV infection    Liver cirrhosis secondary to NASH (HCC) 2004   Biopsy-proven   Obesity    Osteomyelitis (HCC)    Peripheral vascular disease (HCC)    Thrombocytopenia (HCC)    Type 2 diabetes mellitus (HCC)     Family History  Problem Relation Age of Onset   Heart failure Mother    Hyperlipidemia Mother    Diabetes Mellitus II Mother    Heart failure Father    Hyperlipidemia Father     Past Surgical History:  Procedure Laterality Date   ABDOMINAL HYSTERECTOMY     AMPUTATION Left 04/05/2022   Procedure: Metatarsal amputation revision;  Surgeon: Louann Sjogren, MD;  Location: [facility];  Service: Podiatry;  Laterality: Left;  Surgical team to do block   AMPUTATION Left 08/26/2022   Procedure: LEFT BELOW KNEE AMPUTATION;  Surgeon: Nadara Mustard, MD;  Location: [facility] OR;  Service: Orthopedics;  Laterality: Left;   APPLICATION OF WOUND VAC Left 08/26/2022   Procedure: APPLICATION OF WOUND VAC;  Surgeon: Nadara Mustard, MD;  Location: [facility];  Service: Orthopedics;  Laterality: Left;   COLONOSCOPY     Cystoscopy with ureteral stent     ESOPHAGOGASTRODUODENOSCOPY     ESOPHAGOGASTRODUODENOSCOPY (EGD) WITH PROPOFOL N/A 05/20/2021   Procedure: ESOPHAGOGASTRODUODENOSCOPY (EGD) WITH PROPOFOL;  Surgeon: Corbin Ade, MD;  [facility];  Service: Endoscopy;  Laterality: N/A;  needs intubation   IR ANGIOGRAM FOLLOW UP STUDY  05/21/2021    IR ANGIOGRAM SELECTIVE EACH ADDITIONAL VESSEL  05/21/2021   IR EMBO VENOUS NOT HEMORR HEMANG  INC GUIDE ROADMAPPING  05/21/2021   IR US GUIDE VASC ACCESS RIGHT  05/21/2021   IR VENOGRAM RENAL UNI LEFT  05/21/2021   IRRIGATION AND DEBRIDEMENT FOOT Left 06/04/2022   Procedure: REVISIONAL TRAMSMETATARSAL AMPUTATION;  Surgeon: Candelaria Stagers, DPM;  Location: [facility];  Service: Podiatry;  Laterality: Left;   RADIOLOGY WITH ANESTHESIA N/A 05/21/2021   Procedure: IR WITH ANESTHESIA;  Surgeon: Radiologist, Medication, MD;  Location: [facility];  Service: Radiology;  Laterality: N/A;   Toe amputations Bilateral    TUBAL LIGATION     WOUND DEBRIDEMENT Left 11/12/2021   Procedure: DEBRIDEMENT WOUND;FIRST METATARSAL RESECTION; APPLICATION OF SKIN GRAFT SUBSTITUTE;  Surgeon: Park Liter, DPM;  Location: [facility];  Service: Podiatry;  Laterality: Left;   Social History   Occupational History   Not on file  Tobacco Use   Smoking status: Former    Types: Cigarettes   Smokeless tobacco: Never  Vaping Use   Vaping Use: Never used  Substance and Sexual Activity   Alcohol use: Yes    Comment: wine rare   Drug use: Never   Sexual activity: Not on file

## 2023-04-17 ENCOUNTER — Encounter: Payer: Self-pay | Admitting: Orthopedic Surgery

## 2023-04-17 ENCOUNTER — Telehealth: Payer: Self-pay | Admitting: Orthopedic Surgery

## 2023-04-17 ENCOUNTER — Ambulatory Visit: Payer: Medicare Other | Admitting: Orthopedic Surgery

## 2023-04-17 DIAGNOSIS — L97411 Non-pressure chronic ulcer of right heel and midfoot limited to breakdown of skin: Secondary | ICD-10-CM

## 2023-04-17 DIAGNOSIS — Z89512 Acquired absence of left leg below knee: Secondary | ICD-10-CM

## 2023-04-17 NOTE — Telephone Encounter (Signed)
Office dictation faxed to [facility] number below. Advised to call with questions. Pt continues to come weekly for dressing changes.

## 2023-04-17 NOTE — Telephone Encounter (Signed)
Order written and will fax to [facility]

## 2023-04-17 NOTE — Progress Notes (Addendum)
Office Visit Note   Patient: Ariel Arnold           Date of Birth: [birth year]           MRN: [id number] Visit Date: 04/17/2023              Requested by: Joaquin Courts, DO [address] [place],  [place] [address] PCP: Joaquin Courts, DO  Chief Complaint  Patient presents with   Right Foot - Follow-up    Registry pt #14 visit #2      HPI: Patient is a 67 year old woman who presents in follow-up for venous insufficiency ulcer right heel.  She is Registry patient 14 visit #2.  Donated graft was applied last week.  Assessment & Plan: Visit Diagnoses:  1. Ulcer of right heel, limited to breakdown of skin (HCC)   2. History of left below knee amputation (HCC)     Plan: Will reapply compression plus silver cell.  Will plan for additional tissue graft once approved by insurance.  Follow-Up Instructions: Return in about 1 week (around 04/24/2023).   Ortho Exam  Patient is alert, oriented, no adenopathy, well-dressed, normal affect, normal respiratory effort. Examination the wound has improved significantly there is excellent granulation tissue there is epithelization through the middle of the wound and she now has 2 wounds that measure 1 x 1 cm and 1 x 2 cm.  Patient is a new left transtibial  amputee.  Patient's current comorbidities are not expected to impact the ability to function with the prescribed prosthesis. Patient verbally communicates a strong desire to use a prosthesis. Patient currently requires mobility aids to ambulate without a prosthesis.  Expects not to use mobility aids with a new prosthesis.  Patient is a K3 level ambulator that spends a lot of time walking around on uneven terrain over obstacles, up and down stairs, and ambulates with a variable cadence.     Imaging: No results found.   Labs: Lab Results  Component Value Date   HGBA1C 11.2 (H) 04/03/2022   HGBA1C 9.4 (H) 11/10/2021   ESRSEDRATE 11 02/07/2022   ESRSEDRATE 21  11/09/2021   CRP 6.0 (H) 08/19/2022   CRP 4.7 02/07/2022   CRP 0.9 11/09/2021   REPTSTATUS 08/25/2022 FINAL 08/20/2022   REPTSTATUS 08/25/2022 FINAL 08/20/2022   GRAMSTAIN  06/04/2022    NO SQUAMOUS EPITHELIAL CELLS SEEN RARE WBC SEEN NO ORGANISMS SEEN    CULT  08/20/2022    NO GROWTH 5 DAYS Performed at [facility], [address] [place], [place] [address]    CULT  08/20/2022    NO GROWTH 5 DAYS Performed at [facility], [address] [place], [place] [address]    LABORGA METHICILLIN RESISTANT STAPHYLOCOCCUS AUREUS 06/04/2022     Lab Results  Component Value Date   ALBUMIN 2.7 (L) 02/02/2023   ALBUMIN 2.1 (L) 08/31/2022   ALBUMIN 2.0 (L) 08/22/2022    Lab Results  Component Value Date   MG 1.8 08/24/2022   MG 1.7 08/22/2022   MG 1.6 (L) 11/13/2021   No results found for: "VD25OH"  No results found for: "PREALBUMIN"    Latest Ref Rng & Units 02/02/2023    9:14 PM 08/31/2022    3:09 AM 08/30/2022    2:55 AM  CBC EXTENDED  WBC 4.0 - 10.5 K/uL 2.8  5.8  2.9   RBC 3.87 - 5.11 MIL/uL 3.12  2.67  2.61   Hemoglobin 12.0 - 15.0  g/dL 9.6  8.6  8.4   HCT 40.9 - 46.0 % 29.5  25.0  25.2   Platelets 150 - 400 K/uL 81  69  64   NEUT# 1.7 - 7.7 K/uL 1.4     Lymph# 0.7 - 4.0 K/uL 1.1        There is no height or weight on file to calculate BMI.  Orders:  No orders of the defined types were placed in this encounter.  No orders of the defined types were placed in this encounter.    Procedures: No procedures performed  Clinical Data: No additional findings.  ROS:  All other systems negative, except as noted in the HPI. Review of Systems  Objective: Vital Signs: There were no vitals taken for this visit.  Specialty Comments:  No specialty comments available.  PMFS History: Patient Active Problem List   Diagnosis Date Noted   CKD stage G3a/A3, GFR 45-59 and albumin creatinine ratio >300 mg/g (HCC) 01/24/2023   Vitamin D deficiency  01/24/2023   Diabetic osteomyelitis (HCC)    MRSA bacteremia 06/06/2022   Secondary esophageal varices with bleeding (HCC)    Cellulitis of left foot 06/02/2022   Ascites 04/05/2022   Cellulitis 04/02/2022   Lactic acidosis 04/02/2022   Chronic osteomyelitis involving ankle and foot, left (HCC) 11/09/2021   Essential hypertension 11/09/2021   Hematemesis    Liver cirrhosis secondary to NASH (HCC) 05/19/2021   Thrombocytopenia (HCC) 05/19/2021   Obesity (BMI 30-39.9) 02/15/2021   Acquired absence of left foot (HCC) 01/11/2021   Non-healing wound of amputation stump (HCC) 09/09/2020   Coronary atherosclerosis 08/04/2020   Non-prs chronic ulcer oth prt right foot w fat layer exposed (HCC) 12/24/2019   Acquired absence of other left toe(s) (HCC) 11/19/2019   COVID-19 08/20/2019   Difficulty in walking, not elsewhere classified 08/09/2019   History of peptic ulcer disease 08/09/2019   Muscle weakness (generalized) 08/09/2019   Pain in left ankle and joints of left foot 08/09/2019   Unsteadiness on feet 08/09/2019   Gastro-esophageal reflux disease without esophagitis 08/08/2019   History of urinary stone 08/08/2019   Hypomagnesemia 08/08/2019   Long term (current) use of insulin (HCC) 08/08/2019   Major depressive disorder, single episode, unspecified 08/08/2019   Peripheral vascular disease (HCC) 08/08/2019   Type 2 diabetes mellitus with diabetic neuropathy, unspecified (HCC) 08/08/2019   Stage 2 chronic kidney disease due to type 2 diabetes mellitus (HCC) 08/01/2019   Nonalcoholic fatty liver disease 05/07/2019   Type 2 diabetes mellitus treated with insulin (HCC) 05/06/2019   Depressive disorder 04/18/2019   Generalized anxiety disorder 04/18/2019   Seasonal allergic rhinitis 04/18/2019   Arthritis 04/18/2019   Mixed hyperlipidemia 12/19/2018   Past Medical History:  Diagnosis Date   Anemia    Anxiety    Aortic atherosclerosis (HCC)    Arthritis    AVM (arteriovenous  malformation) of colon    Depression    Essential hypertension    GI bleeding    Recurrent   Hepatic encephalopathy (HCC)    History of kidney stones    History of RSV infection    Liver cirrhosis secondary to NASH (HCC) 2004   Biopsy-proven   Obesity    Osteomyelitis (HCC)    Peripheral vascular disease (HCC)    Thrombocytopenia (HCC)    Type 2 diabetes mellitus (HCC)     Family History  Problem Relation Age of Onset   Heart failure Mother    Hyperlipidemia Mother  Diabetes Mellitus II Mother    Heart failure Father    Hyperlipidemia Father     Past Surgical History:  Procedure Laterality Date   ABDOMINAL HYSTERECTOMY     AMPUTATION Left 04/05/2022   Procedure: Metatarsal amputation revision;  Surgeon: Louann Sjogren, MD;  Location: [facility];  Service: Podiatry;  Laterality: Left;  Surgical team to do block   AMPUTATION Left 08/26/2022   Procedure: LEFT BELOW KNEE AMPUTATION;  Surgeon: Nadara Mustard, MD;  Location: [facility] OR;  Service: Orthopedics;  Laterality: Left;   APPLICATION OF WOUND VAC Left 08/26/2022   Procedure: APPLICATION OF WOUND VAC;  Surgeon: Nadara Mustard, MD;  Location: [facility];  Service: Orthopedics;  Laterality: Left;   COLONOSCOPY     Cystoscopy with ureteral stent     ESOPHAGOGASTRODUODENOSCOPY     ESOPHAGOGASTRODUODENOSCOPY (EGD) WITH PROPOFOL N/A 05/20/2021   Procedure: ESOPHAGOGASTRODUODENOSCOPY (EGD) WITH PROPOFOL;  Surgeon: Corbin Ade, MD;  [facility];  Service: Endoscopy;  Laterality: N/A;  needs intubation   IR ANGIOGRAM FOLLOW UP STUDY  05/21/2021   IR ANGIOGRAM SELECTIVE EACH ADDITIONAL VESSEL  05/21/2021   IR EMBO VENOUS NOT HEMORR HEMANG  INC GUIDE ROADMAPPING  05/21/2021   IR US GUIDE VASC ACCESS RIGHT  05/21/2021   IR VENOGRAM RENAL UNI LEFT  05/21/2021   IRRIGATION AND DEBRIDEMENT FOOT Left 06/04/2022   Procedure: REVISIONAL TRAMSMETATARSAL AMPUTATION;  Surgeon: Candelaria Stagers, DPM;  Location: [facility];  Service: Podiatry;  Laterality: Left;    RADIOLOGY WITH ANESTHESIA N/A 05/21/2021   Procedure: IR WITH ANESTHESIA;  Surgeon: Radiologist, Medication, MD;  Location: [facility];  Service: Radiology;  Laterality: N/A;   Toe amputations Bilateral    TUBAL LIGATION     WOUND DEBRIDEMENT Left 11/12/2021   Procedure: DEBRIDEMENT WOUND;FIRST METATARSAL RESECTION; APPLICATION OF SKIN GRAFT SUBSTITUTE;  Surgeon: Park Liter, DPM;  Location: [facility];  Service: Podiatry;  Laterality: Left;   Social History   Occupational History   Not on file  Tobacco Use   Smoking status: Former    Types: Cigarettes   Smokeless tobacco: Never  Vaping Use   Vaping Use: Never used  Substance and Sexual Activity   Alcohol use: Yes    Comment: wine rare   Drug use: Never   Sexual activity: Not on file

## 2023-04-17 NOTE — Telephone Encounter (Signed)
Patient called. She is at [facility]. They need a RX for her new leg faxed over. Their fax #[phone]

## 2023-04-21 ENCOUNTER — Telehealth: Payer: Self-pay | Admitting: Orthopedic Surgery

## 2023-04-21 NOTE — Telephone Encounter (Signed)
[facility] asking to have last office visit and rx emailed to [email or web address]

## 2023-04-24 NOTE — Telephone Encounter (Signed)
[facility] for BKA was faxed to [facility] on 04/17/23. I will have to write a new one and will email to address below along with OV note.

## 2023-04-25 ENCOUNTER — Ambulatory Visit: Payer: Medicare Other | Admitting: Orthopedic Surgery

## 2023-04-25 DIAGNOSIS — L97411 Non-pressure chronic ulcer of right heel and midfoot limited to breakdown of skin: Secondary | ICD-10-CM

## 2023-04-25 NOTE — Telephone Encounter (Signed)
Emailed to address below.

## 2023-04-26 ENCOUNTER — Telehealth: Payer: Self-pay | Admitting: Orthopedic Surgery

## 2023-04-26 NOTE — Telephone Encounter (Signed)
Ariel Arnold from [facility] care called. She would like new orders if any for wound care. Her call back number is [phone]

## 2023-04-27 ENCOUNTER — Telehealth: Payer: Self-pay | Admitting: Orthopedic Surgery

## 2023-04-27 NOTE — Telephone Encounter (Signed)
See next telephone message. This has multiple messages about this question.

## 2023-04-27 NOTE — Telephone Encounter (Signed)
SW [name], gave her VO to leave adaptic dressing in place on top of wound. Only change the gauze and ace wrap daily. We will f/u with her in one week.  Also faxing orders to [phone]

## 2023-04-27 NOTE — Telephone Encounter (Signed)
[facility] advising they called yesterday and had no response, they are advising that they have been told not to touch the bandaging. They need an order for care or they will cancel any further visits. Ariel [phone])

## 2023-04-27 NOTE — Telephone Encounter (Signed)
Pending dictation on note from 04/25/23.

## 2023-05-01 ENCOUNTER — Encounter: Payer: Self-pay | Admitting: Podiatry

## 2023-05-01 ENCOUNTER — Ambulatory Visit: Payer: Medicare Other | Admitting: Podiatry

## 2023-05-01 DIAGNOSIS — L97512 Non-pressure chronic ulcer of other part of right foot with fat layer exposed: Secondary | ICD-10-CM | POA: Diagnosis not present

## 2023-05-01 DIAGNOSIS — L97412 Non-pressure chronic ulcer of right heel and midfoot with fat layer exposed: Secondary | ICD-10-CM | POA: Diagnosis not present

## 2023-05-01 NOTE — Progress Notes (Signed)
  Subjective:  Patient ID: Ariel Arnold, female    DOB: February 18, 1956,   MRN: [id number]  Chief Complaint  Patient presents with   Wound Check    Dr. Lajoyce Corners at ortho care has placed a skin graft and a boot on the right foot, daughter changed dressing last night,     67 y.o. female presents for concern of wounds on her right foot. Has been seeing Dr. Lajoyce Corners who has put some graft on the wound and has been improving. Does have a history of BKA on the left. Relates burning and tingling in their feet. Patient is diabetic and last A1c was  Lab Results  Component Value Date   HGBA1C 11.2 (H) 04/03/2022   .   PCP:  Joaquin Courts, DO     .   PCP:  Joaquin Courts, DO    Denies any current pain . Denies any other pedal complaints. Denies n/v/f/c.   Past Medical History:  Diagnosis Date   Anemia    Anxiety    Aortic atherosclerosis (HCC)    Arthritis    AVM (arteriovenous malformation) of colon    Depression    Essential hypertension    GI bleeding    Recurrent   Hepatic encephalopathy (HCC)    History of kidney stones    History of RSV infection    Liver cirrhosis secondary to NASH (HCC) 2004   Biopsy-proven   Obesity    Osteomyelitis (HCC)    Peripheral vascular disease (HCC)    Thrombocytopenia (HCC)    Type 2 diabetes mellitus (HCC)     Objective:  Physical Exam: Vascular: DP/PT pulses 2/4 bilateral. CFT <3 seconds. Normal hair growth on digits. No edema.  Skin. No lacerations or abrasions bilateral feet.  Musculoskeletal: MMT 5/5 bilateral lower extremities in DF, PF, Inversion and Eversion. Deceased ROM in DF of ankle joint. BKA on left. Right medial first metatarsal with 0.5 cm x 0.5 cm x 0.2 with granular base. No erythema edema or purulence noted. Right medial heel wound 2 cm x 3. cm x 0.2 cm with granular base. Appears much improved.  No erythema edema or purulence noted.   Neurological: Sensation intact to light touch.   Assessment:   1. Skin ulcer of  right heel with fat layer exposed (HCC)   2. Ulcer of right foot with fat layer exposed (HCC)        Plan:  Patient was evaluated and treated and all questions answered. Ulcer right heel with fat layer exposed, ulcer right medial first metatarsal with fat layer exposed  -Dressed with DSD. -Off-loading with PRAFO boot.  -No abx indicated.  -Follow-up with Dr. Lajoyce Corners.  Follow-up in 3 months for rfc.    Louann Sjogren, DPM

## 2023-05-02 ENCOUNTER — Ambulatory Visit: Payer: Medicare Other | Admitting: Orthopedic Surgery

## 2023-05-02 DIAGNOSIS — L97411 Non-pressure chronic ulcer of right heel and midfoot limited to breakdown of skin: Secondary | ICD-10-CM

## 2023-05-04 ENCOUNTER — Telehealth: Payer: Self-pay | Admitting: Orthopedic Surgery

## 2023-05-04 ENCOUNTER — Encounter (HOSPITAL_BASED_OUTPATIENT_CLINIC_OR_DEPARTMENT_OTHER): Payer: Medicare Other | Admitting: General Surgery

## 2023-05-04 NOTE — Telephone Encounter (Signed)
Nurse [name] from [facility] asked if there are updates for wound care instructions please advise send over via fax to [phone]

## 2023-05-05 NOTE — Telephone Encounter (Signed)
The note from her last visit is not yet dictated. I will fax once done. Not sure what wound care orders are at this time if they have changed.

## 2023-05-09 ENCOUNTER — Ambulatory Visit: Payer: Medicare Other | Admitting: Orthopedic Surgery

## 2023-05-09 ENCOUNTER — Encounter: Payer: Self-pay | Admitting: Orthopedic Surgery

## 2023-05-09 DIAGNOSIS — L97811 Non-pressure chronic ulcer of other part of right lower leg limited to breakdown of skin: Secondary | ICD-10-CM

## 2023-05-09 DIAGNOSIS — I83018 Varicose veins of right lower extremity with ulcer other part of lower leg: Secondary | ICD-10-CM | POA: Diagnosis not present

## 2023-05-09 NOTE — Telephone Encounter (Signed)
Pt has an appt today.  

## 2023-05-09 NOTE — Progress Notes (Signed)
Office Visit Note   Patient: Ariel Arnold           Date of Birth: [birth year]           MRN: [id number] Visit Date: 04/25/2023              Requested by: Joaquin Courts, DO [address] [place],  [place] [address] PCP: Joaquin Courts, DO  Chief Complaint  Patient presents with   Right Foot - Follow-up    Registry pt #14 visit #3 Pt approved for in office graft       HPI: Patient is a 67 year old woman Registry patient 14 visit #3.  Patient is undergoing serial compression wraps.  She is developed an [place] of skin that has turned 1 large wound and 2 smaller wounds.  Assessment & Plan: Visit Diagnoses:  1. Ulcer of right heel, limited to breakdown of skin (HCC)     Plan: Kerecis micro graft was applied 8 cm plus a 3 layer compression wrap.  Follow-up in 1 week.  Follow-Up Instructions: Return in about 1 week (around 05/02/2023).   Ortho Exam  Patient is alert, oriented, no adenopathy, well-dressed, normal affect, normal respiratory effort. Examination the wound bed has healthy granulation tissue it is flat measures 3 x 3 cm medial aspect of the right heel.  The wound healing has stalled, the wound bed has healthy granulation tissue, and patient presents for evaluation and application of Kerecis MariGen Micro graft. After informed consent a 10 blade knife was used to debride the skin and soft tissue to healthy viable bleeding granulation tissue.  Silver nitrate was used for hemostasis. The wound measures: 3 cm in length, 3cm  in width, 1 mm in depth, wound location right heel Kerecis MariGen micro tissue graft 8 cm2 was applied, and there was no wastage.  Please see the photo below of the Lot number and expiration date. The micro tissue graft was covered with a nonadherent Adaptic dressing, bolstered with 4 x 4 gauze and secured with a compression wrap.     Imaging: No results found.      Labs: Lab Results  Component Value Date   HGBA1C 11.2  (H) 04/03/2022   HGBA1C 9.4 (H) 11/10/2021   ESRSEDRATE 11 02/07/2022   ESRSEDRATE 21 11/09/2021   CRP 6.0 (H) 08/19/2022   CRP 4.7 02/07/2022   CRP 0.9 11/09/2021   REPTSTATUS 08/25/2022 FINAL 08/20/2022   REPTSTATUS 08/25/2022 FINAL 08/20/2022   GRAMSTAIN  06/04/2022    NO SQUAMOUS EPITHELIAL CELLS SEEN RARE WBC SEEN NO ORGANISMS SEEN    CULT  08/20/2022    NO GROWTH 5 DAYS Performed at [facility], [address] [place], [place] [address]    CULT  08/20/2022    NO GROWTH 5 DAYS Performed at [facility], [address] [place], [place] [address]    LABORGA METHICILLIN RESISTANT STAPHYLOCOCCUS AUREUS 06/04/2022     Lab Results  Component Value Date   ALBUMIN 2.7 (L) 02/02/2023   ALBUMIN 2.1 (L) 08/31/2022   ALBUMIN 2.0 (L) 08/22/2022    Lab Results  Component Value Date   MG 1.8 08/24/2022   MG 1.7 08/22/2022   MG 1.6 (L) 11/13/2021   No results found for: "VD25OH"  No results found for: "PREALBUMIN"    Latest Ref Rng & Units 02/02/2023    9:14 PM 08/31/2022    3:09 AM 08/30/2022    2:55 AM  CBC EXTENDED  WBC  4.0 - 10.5 K/uL 2.8  5.8  2.9   RBC 3.87 - 5.11 MIL/uL 3.12  2.67  2.61   Hemoglobin 12.0 - 15.0 g/dL 9.6  8.6  8.4   HCT 40.9 - 46.0 % 29.5  25.0  25.2   Platelets 150 - 400 K/uL 81  69  64   NEUT# 1.7 - 7.7 K/uL 1.4     Lymph# 0.7 - 4.0 K/uL 1.1        There is no height or weight on file to calculate BMI.  Orders:  No orders of the defined types were placed in this encounter.  No orders of the defined types were placed in this encounter.    Procedures: No procedures performed  Clinical Data: No additional findings.  ROS:  All other systems negative, except as noted in the HPI. Review of Systems  Objective: Vital Signs: There were no vitals taken for this visit.  Specialty Comments:  No specialty comments available.  PMFS History: Patient Active Problem List   Diagnosis Date Noted   CKD stage G3a/A3, GFR 45-59  and albumin creatinine ratio >300 mg/g (HCC) 01/24/2023   Vitamin D deficiency 01/24/2023   Diabetic osteomyelitis (HCC)    MRSA bacteremia 06/06/2022   Secondary esophageal varices with bleeding (HCC)    Cellulitis of left foot 06/02/2022   Ascites 04/05/2022   Cellulitis 04/02/2022   Lactic acidosis 04/02/2022   Chronic osteomyelitis involving ankle and foot, left (HCC) 11/09/2021   Essential hypertension 11/09/2021   Hematemesis    Liver cirrhosis secondary to NASH (HCC) 05/19/2021   Thrombocytopenia (HCC) 05/19/2021   Obesity (BMI 30-39.9) 02/15/2021   Acquired absence of left foot (HCC) 01/11/2021   Non-healing wound of amputation stump (HCC) 09/09/2020   Coronary atherosclerosis 08/04/2020   Non-prs chronic ulcer oth prt right foot w fat layer exposed (HCC) 12/24/2019   Acquired absence of other left toe(s) (HCC) 11/19/2019   COVID-19 08/20/2019   Difficulty in walking, not elsewhere classified 08/09/2019   History of peptic ulcer disease 08/09/2019   Muscle weakness (generalized) 08/09/2019   Pain in left ankle and joints of left foot 08/09/2019   Unsteadiness on feet 08/09/2019   Gastro-esophageal reflux disease without esophagitis 08/08/2019   History of urinary stone 08/08/2019   Hypomagnesemia 08/08/2019   Long term (current) use of insulin (HCC) 08/08/2019   Major depressive disorder, single episode, unspecified 08/08/2019   Peripheral vascular disease (HCC) 08/08/2019   Type 2 diabetes mellitus with diabetic neuropathy, unspecified (HCC) 08/08/2019   Stage 2 chronic kidney disease due to type 2 diabetes mellitus (HCC) 08/01/2019   Nonalcoholic fatty liver disease 05/07/2019   Type 2 diabetes mellitus treated with insulin (HCC) 05/06/2019   Depressive disorder 04/18/2019   Generalized anxiety disorder 04/18/2019   Seasonal allergic rhinitis 04/18/2019   Arthritis 04/18/2019   Mixed hyperlipidemia 12/19/2018   Past Medical History:  Diagnosis Date   Anemia     Anxiety    Aortic atherosclerosis (HCC)    Arthritis    AVM (arteriovenous malformation) of colon    Depression    Essential hypertension    GI bleeding    Recurrent   Hepatic encephalopathy (HCC)    History of kidney stones    History of RSV infection    Liver cirrhosis secondary to NASH (HCC) 2004   Biopsy-proven   Obesity    Osteomyelitis (HCC)    Peripheral vascular disease (HCC)    Thrombocytopenia (HCC)    Type 2  diabetes mellitus (HCC)     Family History  Problem Relation Age of Onset   Heart failure Mother    Hyperlipidemia Mother    Diabetes Mellitus II Mother    Heart failure Father    Hyperlipidemia Father     Past Surgical History:  Procedure Laterality Date   ABDOMINAL HYSTERECTOMY     AMPUTATION Left 04/05/2022   Procedure: Metatarsal amputation revision;  Surgeon: Louann Sjogren, MD;  Location: [facility];  Service: Podiatry;  Laterality: Left;  Surgical team to do block   AMPUTATION Left 08/26/2022   Procedure: LEFT BELOW KNEE AMPUTATION;  Surgeon: Nadara Mustard, MD;  Location: [facility] OR;  Service: Orthopedics;  Laterality: Left;   APPLICATION OF WOUND VAC Left 08/26/2022   Procedure: APPLICATION OF WOUND VAC;  Surgeon: Nadara Mustard, MD;  Location: [facility];  Service: Orthopedics;  Laterality: Left;   COLONOSCOPY     Cystoscopy with ureteral stent     ESOPHAGOGASTRODUODENOSCOPY     ESOPHAGOGASTRODUODENOSCOPY (EGD) WITH PROPOFOL N/A 05/20/2021   Procedure: ESOPHAGOGASTRODUODENOSCOPY (EGD) WITH PROPOFOL;  Surgeon: Corbin Ade, MD;  [facility];  Service: Endoscopy;  Laterality: N/A;  needs intubation   IR ANGIOGRAM FOLLOW UP STUDY  05/21/2021   IR ANGIOGRAM SELECTIVE EACH ADDITIONAL VESSEL  05/21/2021   IR EMBO VENOUS NOT HEMORR HEMANG  INC GUIDE ROADMAPPING  05/21/2021   IR US GUIDE VASC ACCESS RIGHT  05/21/2021   IR VENOGRAM RENAL UNI LEFT  05/21/2021   IRRIGATION AND DEBRIDEMENT FOOT Left 06/04/2022   Procedure: REVISIONAL TRAMSMETATARSAL AMPUTATION;  Surgeon:  Candelaria Stagers, DPM;  Location: [facility];  Service: Podiatry;  Laterality: Left;   RADIOLOGY WITH ANESTHESIA N/A 05/21/2021   Procedure: IR WITH ANESTHESIA;  Surgeon: Radiologist, Medication, MD;  Location: [facility];  Service: Radiology;  Laterality: N/A;   Toe amputations Bilateral    TUBAL LIGATION     WOUND DEBRIDEMENT Left 11/12/2021   Procedure: DEBRIDEMENT WOUND;FIRST METATARSAL RESECTION; APPLICATION OF SKIN GRAFT SUBSTITUTE;  Surgeon: Park Liter, DPM;  Location: [facility];  Service: Podiatry;  Laterality: Left;   Social History   Occupational History   Not on file  Tobacco Use   Smoking status: Former    Types: Cigarettes   Smokeless tobacco: Never  Vaping Use   Vaping Use: Never used  Substance and Sexual Activity   Alcohol use: Yes    Comment: wine rare   Drug use: Never   Sexual activity: Not on file

## 2023-05-09 NOTE — Telephone Encounter (Signed)
Dial soap and water cleansing, apply dry dressing, gauze roll or 4 x 4 and cover with ACE wrap. Faxed to # below.

## 2023-05-10 ENCOUNTER — Encounter: Payer: Self-pay | Admitting: Orthopedic Surgery

## 2023-05-10 NOTE — Progress Notes (Signed)
Office Visit Note   Patient: Ariel Arnold           Date of Birth: 1956/09/11           MRN: [id number] Visit Date: 05/09/2023              Requested by: Joaquin Courts, DO [address] [place],  [place] [address] PCP: Joaquin Courts, DO  Chief Complaint  Patient presents with   Right Foot - Follow-up    registry pt #14 visit # 5      HPI:   Patient is seen in follow-up Registry patient 14 visit #5 for right heel ulcer.  Kerecis 8 cm was applied on May 7.  Patient is currently performing  Dial soap cleansing and dry dressing changes she does not have PRAFO boots.     Assessment & Plan: Visit Diagnoses:  1. Venous stasis ulcer of other part of right lower leg limited to breakdown of skin with varicose veins (HCC)     Plan: Continue current wound care follow-up in 1 week.  Follow-Up Instructions: Return in about 1 week (around 05/16/2023).   Ortho Exam  Patient is alert, oriented, no adenopathy, well-dressed, normal affect, normal respiratory effort. Examination the heel ulcer is significantly improved that is currently 5 mm in diameter for 2 wounds.  There is healthy granulation tissue there is pitting edema.  Imaging: No results found.   Labs: Lab Results  Component Value Date   HGBA1C 11.2 (H) 04/03/2022   HGBA1C 9.4 (H) 11/10/2021   ESRSEDRATE 11 02/07/2022   ESRSEDRATE 21 11/09/2021   CRP 6.0 (H) 08/19/2022   CRP 4.7 02/07/2022   CRP 0.9 11/09/2021   REPTSTATUS 08/25/2022 FINAL 08/20/2022   REPTSTATUS 08/25/2022 FINAL 08/20/2022   GRAMSTAIN  06/04/2022    NO SQUAMOUS EPITHELIAL CELLS SEEN RARE WBC SEEN NO ORGANISMS SEEN    CULT  08/20/2022    NO GROWTH 5 DAYS Performed at [facility], [address] [place], [place] [address]    CULT  08/20/2022    NO GROWTH 5 DAYS Performed at [facility], [address], [place], [place] [address]    LABORGA METHICILLIN RESISTANT STAPHYLOCOCCUS AUREUS 06/04/2022     Lab  Results  Component Value Date   ALBUMIN 2.7 (L) 02/02/2023   ALBUMIN 2.1 (L) 08/31/2022   ALBUMIN 2.0 (L) 08/22/2022    Lab Results  Component Value Date   MG 1.8 08/24/2022   MG 1.7 08/22/2022   MG 1.6 (L) 11/13/2021   No results found for: "VD25OH"  No results found for: "PREALBUMIN"    Latest Ref Rng & Units 02/02/2023    9:14 PM 08/31/2022    3:09 AM 08/30/2022    2:55 AM  CBC EXTENDED  WBC 4.0 - 10.5 K/uL 2.8  5.8  2.9   RBC 3.87 - 5.11 MIL/uL 3.12  2.67  2.61   Hemoglobin 12.0 - 15.0 g/dL 9.6  8.6  8.4   HCT 21.3 - 46.0 % 29.5  25.0  25.2   Platelets 150 - 400 K/uL 81  69  64   NEUT# 1.7 - 7.7 K/uL 1.4     Lymph# 0.7 - 4.0 K/uL 1.1        There is no height or weight on file to calculate BMI.  Orders:  No orders of the defined types were placed in this encounter.  No orders of the defined types were placed in this encounter.  Procedures: No procedures performed  Clinical Data: No additional findings.  ROS:  All other systems negative, except as noted in the HPI. Review of Systems  Objective: Vital Signs: There were no vitals taken for this visit.  Specialty Comments:  No specialty comments available.  PMFS History: Patient Active Problem List   Diagnosis Date Noted   CKD stage G3a/A3, GFR 45-59 and albumin creatinine ratio >300 mg/g (HCC) 01/24/2023   Vitamin D deficiency 01/24/2023   Diabetic osteomyelitis (HCC)    MRSA bacteremia 06/06/2022   Secondary esophageal varices with bleeding (HCC)    Cellulitis of left foot 06/02/2022   Ascites 04/05/2022   Cellulitis 04/02/2022   Lactic acidosis 04/02/2022   Chronic osteomyelitis involving ankle and foot, left (HCC) 11/09/2021   Essential hypertension 11/09/2021   Hematemesis    Liver cirrhosis secondary to NASH (HCC) 05/19/2021   Thrombocytopenia (HCC) 05/19/2021   Obesity (BMI 30-39.9) 02/15/2021   Acquired absence of left foot (HCC) 01/11/2021   Non-healing wound of amputation stump  (HCC) 09/09/2020   Coronary atherosclerosis 08/04/2020   Non-prs chronic ulcer oth prt right foot w fat layer exposed (HCC) 12/24/2019   Acquired absence of other left toe(s) (HCC) 11/19/2019   COVID-19 08/20/2019   Difficulty in walking, not elsewhere classified 08/09/2019   History of peptic ulcer disease 08/09/2019   Muscle weakness (generalized) 08/09/2019   Pain in left ankle and joints of left foot 08/09/2019   Unsteadiness on feet 08/09/2019   Gastro-esophageal reflux disease without esophagitis 08/08/2019   History of urinary stone 08/08/2019   Hypomagnesemia 08/08/2019   Long term (current) use of insulin (HCC) 08/08/2019   Major depressive disorder, single episode, unspecified 08/08/2019   Peripheral vascular disease (HCC) 08/08/2019   Type 2 diabetes mellitus with diabetic neuropathy, unspecified (HCC) 08/08/2019   Stage 2 chronic kidney disease due to type 2 diabetes mellitus (HCC) 08/01/2019   Nonalcoholic fatty liver disease 05/07/2019   Type 2 diabetes mellitus treated with insulin (HCC) 05/06/2019   Depressive disorder 04/18/2019   Generalized anxiety disorder 04/18/2019   Seasonal allergic rhinitis 04/18/2019   Arthritis 04/18/2019   Mixed hyperlipidemia 12/19/2018   Past Medical History:  Diagnosis Date   Anemia    Anxiety    Aortic atherosclerosis (HCC)    Arthritis    AVM (arteriovenous malformation) of colon    Depression    Essential hypertension    GI bleeding    Recurrent   Hepatic encephalopathy (HCC)    History of kidney stones    History of RSV infection    Liver cirrhosis secondary to NASH (HCC) 2004   Biopsy-proven   Obesity    Osteomyelitis (HCC)    Peripheral vascular disease (HCC)    Thrombocytopenia (HCC)    Type 2 diabetes mellitus (HCC)     Family History  Problem Relation Age of Onset   Heart failure Mother    Hyperlipidemia Mother    Diabetes Mellitus II Mother    Heart failure Father    Hyperlipidemia Father     Past  Surgical History:  Procedure Laterality Date   ABDOMINAL HYSTERECTOMY     AMPUTATION Left 04/05/2022   Procedure: Metatarsal amputation revision;  Surgeon: Louann Sjogren, MD;  Location: [facility];  Service: Podiatry;  Laterality: Left;  Surgical team to do block   AMPUTATION Left 08/26/2022   Procedure: LEFT BELOW KNEE AMPUTATION;  Surgeon: Nadara Mustard, MD;  Location: [facility] OR;  Service: Orthopedics;  Laterality: Left;  APPLICATION OF WOUND VAC Left 08/26/2022   Procedure: APPLICATION OF WOUND VAC;  Surgeon: Nadara Mustard, MD;  Location: [facility] OR;  Service: Orthopedics;  Laterality: Left;   COLONOSCOPY     Cystoscopy with ureteral stent     ESOPHAGOGASTRODUODENOSCOPY     ESOPHAGOGASTRODUODENOSCOPY (EGD) WITH PROPOFOL N/A 05/20/2021   Procedure: ESOPHAGOGASTRODUODENOSCOPY (EGD) WITH PROPOFOL;  Surgeon: Corbin Ade, MD;  [facility];  Service: Endoscopy;  Laterality: N/A;  needs intubation   IR ANGIOGRAM FOLLOW UP STUDY  05/21/2021   IR ANGIOGRAM SELECTIVE EACH ADDITIONAL VESSEL  05/21/2021   IR EMBO VENOUS NOT HEMORR HEMANG  INC GUIDE ROADMAPPING  05/21/2021   IR US GUIDE VASC ACCESS RIGHT  05/21/2021   IR VENOGRAM RENAL UNI LEFT  05/21/2021   IRRIGATION AND DEBRIDEMENT FOOT Left 06/04/2022   Procedure: REVISIONAL TRAMSMETATARSAL AMPUTATION;  Surgeon: Candelaria Stagers, DPM;  Location: [facility];  Service: Podiatry;  Laterality: Left;   RADIOLOGY WITH ANESTHESIA N/A 05/21/2021   Procedure: IR WITH ANESTHESIA;  Surgeon: Radiologist, Medication, MD;  Location: [facility] OR;  Service: Radiology;  Laterality: N/A;   Toe amputations Bilateral    TUBAL LIGATION     WOUND DEBRIDEMENT Left 11/12/2021   Procedure: DEBRIDEMENT WOUND;FIRST METATARSAL RESECTION; APPLICATION OF SKIN GRAFT SUBSTITUTE;  Surgeon: Park Liter, DPM;  Location: [facility];  Service: Podiatry;  Laterality: Left;   Social History   Occupational History   Not on file  Tobacco Use   Smoking status: Former    Types: Cigarettes   Smokeless  tobacco: Never  Vaping Use   Vaping Use: Never used  Substance and Sexual Activity   Alcohol use: Yes    Comment: wine rare   Drug use: Never   Sexual activity: Not on file

## 2023-05-11 ENCOUNTER — Encounter: Payer: Self-pay | Admitting: Orthopedic Surgery

## 2023-05-11 NOTE — Progress Notes (Signed)
Office Visit Note   Patient: Ariel Arnold           Date of Birth: 06-05-1956           MRN: [id number] Visit Date: 05/02/2023              Requested by: Joaquin Courts, DO [address] [place],  [place] [address] PCP: Joaquin Courts, DO  Chief Complaint  Patient presents with   Right Foot - Follow-up    Registry pt #14 visit #4      HPI: Patient is a 67 year old woman Registry patient 14 visit #4 with a right heel ulcer.  Last application of tissue graft was May 7.  Patient had a compression wrap removed for her podiatry appointment.  She is currently in an Ace wrap.  Assessment & Plan: Visit Diagnoses:  1. Ulcer of right heel, limited to breakdown of skin (HCC)     Plan: Continue with compression and elevation and dry dressing changes reevaluate in 1 week  Follow-Up Instructions: Return in about 1 week (around 05/09/2023).   Ortho Exam  Patient is alert, oriented, no adenopathy, well-dressed, normal affect, normal respiratory effort. Examination the wound shows excellent healing has 100% healthy granulation tissue that is flat there are 2 ulcers that are 5 mm in diameter each.  There is an [place] of skin between the ulcers.  Imaging: No results found.   Labs: Lab Results  Component Value Date   HGBA1C 11.2 (H) 04/03/2022   HGBA1C 9.4 (H) 11/10/2021   ESRSEDRATE 11 02/07/2022   ESRSEDRATE 21 11/09/2021   CRP 6.0 (H) 08/19/2022   CRP 4.7 02/07/2022   CRP 0.9 11/09/2021   REPTSTATUS 08/25/2022 FINAL 08/20/2022   REPTSTATUS 08/25/2022 FINAL 08/20/2022   GRAMSTAIN  06/04/2022    NO SQUAMOUS EPITHELIAL CELLS SEEN RARE WBC SEEN NO ORGANISMS SEEN    CULT  08/20/2022    NO GROWTH 5 DAYS Performed at [facility], [address], [place], [place] [address]    CULT  08/20/2022    NO GROWTH 5 DAYS Performed at [facility], [address], [place], [place] [address]    LABORGA METHICILLIN RESISTANT STAPHYLOCOCCUS AUREUS 06/04/2022      Lab Results  Component Value Date   ALBUMIN 2.7 (L) 02/02/2023   ALBUMIN 2.1 (L) 08/31/2022   ALBUMIN 2.0 (L) 08/22/2022    Lab Results  Component Value Date   MG 1.8 08/24/2022   MG 1.7 08/22/2022   MG 1.6 (L) 11/13/2021   No results found for: "VD25OH"  No results found for: "PREALBUMIN"    Latest Ref Rng & Units 02/02/2023    9:14 PM 08/31/2022    3:09 AM 08/30/2022    2:55 AM  CBC EXTENDED  WBC 4.0 - 10.5 K/uL 2.8  5.8  2.9   RBC 3.87 - 5.11 MIL/uL 3.12  2.67  2.61   Hemoglobin 12.0 - 15.0 g/dL 9.6  8.6  8.4   HCT 46.9 - 46.0 % 29.5  25.0  25.2   Platelets 150 - 400 K/uL 81  69  64   NEUT# 1.7 - 7.7 K/uL 1.4     Lymph# 0.7 - 4.0 K/uL 1.1        There is no height or weight on file to calculate BMI.  Orders:  No orders of the defined types were placed in this encounter.  No orders of the defined types were placed in this encounter.  Procedures: No procedures performed  Clinical Data: No additional findings.  ROS:  All other systems negative, except as noted in the HPI. Review of Systems  Objective: Vital Signs: There were no vitals taken for this visit.  Specialty Comments:  No specialty comments available.  PMFS History: Patient Active Problem List   Diagnosis Date Noted   CKD stage G3a/A3, GFR 45-59 and albumin creatinine ratio >300 mg/g (HCC) 01/24/2023   Vitamin D deficiency 01/24/2023   Diabetic osteomyelitis (HCC)    MRSA bacteremia 06/06/2022   Secondary esophageal varices with bleeding (HCC)    Cellulitis of left foot 06/02/2022   Ascites 04/05/2022   Cellulitis 04/02/2022   Lactic acidosis 04/02/2022   Chronic osteomyelitis involving ankle and foot, left (HCC) 11/09/2021   Essential hypertension 11/09/2021   Hematemesis    Liver cirrhosis secondary to NASH (HCC) 05/19/2021   Thrombocytopenia (HCC) 05/19/2021   Obesity (BMI 30-39.9) 02/15/2021   Acquired absence of left foot (HCC) 01/11/2021   Non-healing wound of  amputation stump (HCC) 09/09/2020   Coronary atherosclerosis 08/04/2020   Non-prs chronic ulcer oth prt right foot w fat layer exposed (HCC) 12/24/2019   Acquired absence of other left toe(s) (HCC) 11/19/2019   COVID-19 08/20/2019   Difficulty in walking, not elsewhere classified 08/09/2019   History of peptic ulcer disease 08/09/2019   Muscle weakness (generalized) 08/09/2019   Pain in left ankle and joints of left foot 08/09/2019   Unsteadiness on feet 08/09/2019   Gastro-esophageal reflux disease without esophagitis 08/08/2019   History of urinary stone 08/08/2019   Hypomagnesemia 08/08/2019   Long term (current) use of insulin (HCC) 08/08/2019   Major depressive disorder, single episode, unspecified 08/08/2019   Peripheral vascular disease (HCC) 08/08/2019   Type 2 diabetes mellitus with diabetic neuropathy, unspecified (HCC) 08/08/2019   Stage 2 chronic kidney disease due to type 2 diabetes mellitus (HCC) 08/01/2019   Nonalcoholic fatty liver disease 05/07/2019   Type 2 diabetes mellitus treated with insulin (HCC) 05/06/2019   Depressive disorder 04/18/2019   Generalized anxiety disorder 04/18/2019   Seasonal allergic rhinitis 04/18/2019   Arthritis 04/18/2019   Mixed hyperlipidemia 12/19/2018   Past Medical History:  Diagnosis Date   Anemia    Anxiety    Aortic atherosclerosis (HCC)    Arthritis    AVM (arteriovenous malformation) of colon    Depression    Essential hypertension    GI bleeding    Recurrent   Hepatic encephalopathy (HCC)    History of kidney stones    History of RSV infection    Liver cirrhosis secondary to NASH (HCC) 2004   Biopsy-proven   Obesity    Osteomyelitis (HCC)    Peripheral vascular disease (HCC)    Thrombocytopenia (HCC)    Type 2 diabetes mellitus (HCC)     Family History  Problem Relation Age of Onset   Heart failure Mother    Hyperlipidemia Mother    Diabetes Mellitus II Mother    Heart failure Father    Hyperlipidemia Father      Past Surgical History:  Procedure Laterality Date   ABDOMINAL HYSTERECTOMY     AMPUTATION Left 04/05/2022   Procedure: Metatarsal amputation revision;  Surgeon: Louann Sjogren, MD;  Location: [facility];  Service: Podiatry;  Laterality: Left;  Surgical team to do block   AMPUTATION Left 08/26/2022   Procedure: LEFT BELOW KNEE AMPUTATION;  Surgeon: Nadara Mustard, MD;  Location: [facility] OR;  Service: Orthopedics;  Laterality: Left;  APPLICATION OF WOUND VAC Left 08/26/2022   Procedure: APPLICATION OF WOUND VAC;  Surgeon: Nadara Mustard, MD;  Location: [facility] OR;  Service: Orthopedics;  Laterality: Left;   COLONOSCOPY     Cystoscopy with ureteral stent     ESOPHAGOGASTRODUODENOSCOPY     ESOPHAGOGASTRODUODENOSCOPY (EGD) WITH PROPOFOL N/A 05/20/2021   Procedure: ESOPHAGOGASTRODUODENOSCOPY (EGD) WITH PROPOFOL;  Surgeon: Corbin Ade, MD;  [facility];  Service: Endoscopy;  Laterality: N/A;  needs intubation   IR ANGIOGRAM FOLLOW UP STUDY  05/21/2021   IR ANGIOGRAM SELECTIVE EACH ADDITIONAL VESSEL  05/21/2021   IR EMBO VENOUS NOT HEMORR HEMANG  INC GUIDE ROADMAPPING  05/21/2021   IR US GUIDE VASC ACCESS RIGHT  05/21/2021   IR VENOGRAM RENAL UNI LEFT  05/21/2021   IRRIGATION AND DEBRIDEMENT FOOT Left 06/04/2022   Procedure: REVISIONAL TRAMSMETATARSAL AMPUTATION;  Surgeon: Candelaria Stagers, DPM;  Location: [facility];  Service: Podiatry;  Laterality: Left;   RADIOLOGY WITH ANESTHESIA N/A 05/21/2021   Procedure: IR WITH ANESTHESIA;  Surgeon: Radiologist, Medication, MD;  Location: [facility];  Service: Radiology;  Laterality: N/A;   Toe amputations Bilateral    TUBAL LIGATION     WOUND DEBRIDEMENT Left 11/12/2021   Procedure: DEBRIDEMENT WOUND;FIRST METATARSAL RESECTION; APPLICATION OF SKIN GRAFT SUBSTITUTE;  Surgeon: Park Liter, DPM;  Location: [facility];  Service: Podiatry;  Laterality: Left;   Social History   Occupational History   Not on file  Tobacco Use   Smoking status: Former    Types: Cigarettes    Smokeless tobacco: Never  Vaping Use   Vaping Use: Never used  Substance and Sexual Activity   Alcohol use: Yes    Comment: wine rare   Drug use: Never   Sexual activity: Not on file

## 2023-05-16 ENCOUNTER — Encounter: Payer: Self-pay | Admitting: Orthopedic Surgery

## 2023-05-16 ENCOUNTER — Ambulatory Visit: Payer: Medicare Other | Admitting: Orthopedic Surgery

## 2023-05-16 DIAGNOSIS — L97811 Non-pressure chronic ulcer of other part of right lower leg limited to breakdown of skin: Secondary | ICD-10-CM

## 2023-05-16 DIAGNOSIS — I83018 Varicose veins of right lower extremity with ulcer other part of lower leg: Secondary | ICD-10-CM | POA: Diagnosis not present

## 2023-05-16 DIAGNOSIS — Z89512 Acquired absence of left leg below knee: Secondary | ICD-10-CM | POA: Diagnosis not present

## 2023-05-16 NOTE — Progress Notes (Signed)
Office Visit Note   Patient: Ariel Arnold           Date of Birth: [birth year]           MRN: [id number] Visit Date: 05/16/2023              Requested by: Joaquin Courts, DO [address] [place],  [place] [address] PCP: Joaquin Courts, DO  Chief Complaint  Patient presents with   Right Foot - Follow-up      HPI: Patient is a 67 year old woman who is seen in follow-up for venous ulcer right heel.  Patient is Registry patient 14 visit #6.  Kerecis applied May 7 in office.  Assessment & Plan: Visit Diagnoses:  1. Venous stasis ulcer of other part of right lower leg limited to breakdown of skin with varicose veins (HCC)   2. History of left below knee amputation (HCC)     Plan: Continue dressing changes.  Follow-Up Instructions: Return in about 1 week (around 05/23/2023).   Ortho Exam  Patient is alert, oriented, no adenopathy, well-dressed, normal affect, normal respiratory effort. Examination the proximal ulcer is completely healed distally there is a scab the scab is removed there is healthy granulation tissue it measures 3 mm in diameter and this was touched with silver nitrate.  4 x 4 and an Ace were applied.  Imaging: No results found.    Labs: Lab Results  Component Value Date   HGBA1C 11.2 (H) 04/03/2022   HGBA1C 9.4 (H) 11/10/2021   ESRSEDRATE 11 02/07/2022   ESRSEDRATE 21 11/09/2021   CRP 6.0 (H) 08/19/2022   CRP 4.7 02/07/2022   CRP 0.9 11/09/2021   REPTSTATUS 08/25/2022 FINAL 08/20/2022   REPTSTATUS 08/25/2022 FINAL 08/20/2022   GRAMSTAIN  06/04/2022    NO SQUAMOUS EPITHELIAL CELLS SEEN RARE WBC SEEN NO ORGANISMS SEEN    CULT  08/20/2022    NO GROWTH 5 DAYS Performed at [facility], [address] [place], [place] [address]    CULT  08/20/2022    NO GROWTH 5 DAYS Performed at [facility], [address] [place], [place] [address]    LABORGA METHICILLIN RESISTANT STAPHYLOCOCCUS AUREUS 06/04/2022     Lab  Results  Component Value Date   ALBUMIN 2.7 (L) 02/02/2023   ALBUMIN 2.1 (L) 08/31/2022   ALBUMIN 2.0 (L) 08/22/2022    Lab Results  Component Value Date   MG 1.8 08/24/2022   MG 1.7 08/22/2022   MG 1.6 (L) 11/13/2021   No results found for: "VD25OH"  No results found for: "PREALBUMIN"    Latest Ref Rng & Units 02/02/2023    9:14 PM 08/31/2022    3:09 AM 08/30/2022    2:55 AM  CBC EXTENDED  WBC 4.0 - 10.5 K/uL 2.8  5.8  2.9   RBC 3.87 - 5.11 MIL/uL 3.12  2.67  2.61   Hemoglobin 12.0 - 15.0 g/dL 9.6  8.6  8.4   HCT 21.3 - 46.0 % 29.5  25.0  25.2   Platelets 150 - 400 K/uL 81  69  64   NEUT# 1.7 - 7.7 K/uL 1.4     Lymph# 0.7 - 4.0 K/uL 1.1        There is no height or weight on file to calculate BMI.  Orders:  No orders of the defined types were placed in this encounter.  No orders of the defined types were placed in this encounter.    Procedures:  No procedures performed  Clinical Data: No additional findings.  ROS:  All other systems negative, except as noted in the HPI. Review of Systems  Objective: Vital Signs: There were no vitals taken for this visit.  Specialty Comments:  No specialty comments available.  PMFS History: Patient Active Problem List   Diagnosis Date Noted   CKD stage G3a/A3, GFR 45-59 and albumin creatinine ratio >300 mg/g (HCC) 01/24/2023   Vitamin D deficiency 01/24/2023   Diabetic osteomyelitis (HCC)    MRSA bacteremia 06/06/2022   Secondary esophageal varices with bleeding (HCC)    Cellulitis of left foot 06/02/2022   Ascites 04/05/2022   Cellulitis 04/02/2022   Lactic acidosis 04/02/2022   Chronic osteomyelitis involving ankle and foot, left (HCC) 11/09/2021   Essential hypertension 11/09/2021   Hematemesis    Liver cirrhosis secondary to NASH (HCC) 05/19/2021   Thrombocytopenia (HCC) 05/19/2021   Obesity (BMI 30-39.9) 02/15/2021   Acquired absence of left foot (HCC) 01/11/2021   Non-healing wound of amputation stump  (HCC) 09/09/2020   Coronary atherosclerosis 08/04/2020   Non-prs chronic ulcer oth prt right foot w fat layer exposed (HCC) 12/24/2019   Acquired absence of other left toe(s) (HCC) 11/19/2019   COVID-19 08/20/2019   Difficulty in walking, not elsewhere classified 08/09/2019   History of peptic ulcer disease 08/09/2019   Muscle weakness (generalized) 08/09/2019   Pain in left ankle and joints of left foot 08/09/2019   Unsteadiness on feet 08/09/2019   Gastro-esophageal reflux disease without esophagitis 08/08/2019   History of urinary stone 08/08/2019   Hypomagnesemia 08/08/2019   Long term (current) use of insulin (HCC) 08/08/2019   Major depressive disorder, single episode, unspecified 08/08/2019   Peripheral vascular disease (HCC) 08/08/2019   Type 2 diabetes mellitus with diabetic neuropathy, unspecified (HCC) 08/08/2019   Stage 2 chronic kidney disease due to type 2 diabetes mellitus (HCC) 08/01/2019   Nonalcoholic fatty liver disease 05/07/2019   Type 2 diabetes mellitus treated with insulin (HCC) 05/06/2019   Depressive disorder 04/18/2019   Generalized anxiety disorder 04/18/2019   Seasonal allergic rhinitis 04/18/2019   Arthritis 04/18/2019   Mixed hyperlipidemia 12/19/2018   Past Medical History:  Diagnosis Date   Anemia    Anxiety    Aortic atherosclerosis (HCC)    Arthritis    AVM (arteriovenous malformation) of colon    Depression    Essential hypertension    GI bleeding    Recurrent   Hepatic encephalopathy (HCC)    History of kidney stones    History of RSV infection    Liver cirrhosis secondary to NASH (HCC) 2004   Biopsy-proven   Obesity    Osteomyelitis (HCC)    Peripheral vascular disease (HCC)    Thrombocytopenia (HCC)    Type 2 diabetes mellitus (HCC)     Family History  Problem Relation Age of Onset   Heart failure Mother    Hyperlipidemia Mother    Diabetes Mellitus II Mother    Heart failure Father    Hyperlipidemia Father     Past  Surgical History:  Procedure Laterality Date   ABDOMINAL HYSTERECTOMY     AMPUTATION Left 04/05/2022   Procedure: Metatarsal amputation revision;  Surgeon: Louann Sjogren, MD;  Location: [facility];  Service: Podiatry;  Laterality: Left;  Surgical team to do block   AMPUTATION Left 08/26/2022   Procedure: LEFT BELOW KNEE AMPUTATION;  Surgeon: Nadara Mustard, MD;  Location: [facility] OR;  Service: Orthopedics;  Laterality: Left;   APPLICATION  OF WOUND VAC Left 08/26/2022   Procedure: APPLICATION OF WOUND VAC;  Surgeon: Nadara Mustard, MD;  Location: [facility] OR;  Service: Orthopedics;  Laterality: Left;   COLONOSCOPY     Cystoscopy with ureteral stent     ESOPHAGOGASTRODUODENOSCOPY     ESOPHAGOGASTRODUODENOSCOPY (EGD) WITH PROPOFOL N/A 05/20/2021   Procedure: ESOPHAGOGASTRODUODENOSCOPY (EGD) WITH PROPOFOL;  Surgeon: Corbin Ade, MD;  [facility];  Service: Endoscopy;  Laterality: N/A;  needs intubation   IR ANGIOGRAM FOLLOW UP STUDY  05/21/2021   IR ANGIOGRAM SELECTIVE EACH ADDITIONAL VESSEL  05/21/2021   IR EMBO VENOUS NOT HEMORR HEMANG  INC GUIDE ROADMAPPING  05/21/2021   IR US GUIDE VASC ACCESS RIGHT  05/21/2021   IR VENOGRAM RENAL UNI LEFT  05/21/2021   IRRIGATION AND DEBRIDEMENT FOOT Left 06/04/2022   Procedure: REVISIONAL TRAMSMETATARSAL AMPUTATION;  Surgeon: Candelaria Stagers, DPM;  Location: [facility];  Service: Podiatry;  Laterality: Left;   RADIOLOGY WITH ANESTHESIA N/A 05/21/2021   Procedure: IR WITH ANESTHESIA;  Surgeon: Radiologist, Medication, MD;  Location: [facility];  Service: Radiology;  Laterality: N/A;   Toe amputations Bilateral    TUBAL LIGATION     WOUND DEBRIDEMENT Left 11/12/2021   Procedure: DEBRIDEMENT WOUND;FIRST METATARSAL RESECTION; APPLICATION OF SKIN GRAFT SUBSTITUTE;  Surgeon: Park Liter, DPM;  Location: [facility];  Service: Podiatry;  Laterality: Left;   Social History   Occupational History   Not on file  Tobacco Use   Smoking status: Former    Types: Cigarettes   Smokeless  tobacco: Never  Vaping Use   Vaping Use: Never used  Substance and Sexual Activity   Alcohol use: Yes    Comment: wine rare   Drug use: Never   Sexual activity: Not on file

## 2023-05-23 ENCOUNTER — Telehealth: Payer: Self-pay | Admitting: Orthopedic Surgery

## 2023-05-23 ENCOUNTER — Ambulatory Visit: Payer: Medicare Other | Admitting: Orthopedic Surgery

## 2023-05-23 DIAGNOSIS — I83018 Varicose veins of right lower extremity with ulcer other part of lower leg: Secondary | ICD-10-CM

## 2023-05-23 DIAGNOSIS — L97811 Non-pressure chronic ulcer of other part of right lower leg limited to breakdown of skin: Secondary | ICD-10-CM | POA: Diagnosis not present

## 2023-05-23 NOTE — Telephone Encounter (Signed)
[employer] needs to have the 5 tenants for prosthetic on office note 04/17/23 please.

## 2023-05-24 NOTE — Telephone Encounter (Signed)
Ariel Arnold informed.

## 2023-06-02 ENCOUNTER — Encounter: Payer: Self-pay | Admitting: Orthopedic Surgery

## 2023-06-02 NOTE — Progress Notes (Signed)
Office Visit Note   Patient: Ariel Arnold           Date of Birth: 08-10-1956           MRN: [id number] Visit Date: 05/23/2023              Requested by: Joaquin Courts, DO [address] [place],  [place] [address] PCP: Joaquin Courts, DO  Chief Complaint  Patient presents with   Right Foot - Follow-up    Registry pt #14 visit #7      HPI: Patient is a 67 year old woman who is Registry patient 14 visit #7 for venous ulceration.  Assessment & Plan: Visit Diagnoses:  1. Venous stasis ulcer of other part of right lower leg limited to breakdown of skin with varicose veins (HCC)     Plan: The venous ulcer is healed.  She will continue with compression socks.  Follow-Up Instructions: Return in about 1 week (around 05/30/2023).   Ortho Exam  Patient is alert, oriented, no adenopathy, well-dressed, normal affect, normal respiratory effort. Examination the scab is removed the venous ulcer has healed.  Imaging: No results found.    Labs: Lab Results  Component Value Date   HGBA1C 11.2 (H) 04/03/2022   HGBA1C 9.4 (H) 11/10/2021   ESRSEDRATE 11 02/07/2022   ESRSEDRATE 21 11/09/2021   CRP 6.0 (H) 08/19/2022   CRP 4.7 02/07/2022   CRP 0.9 11/09/2021   REPTSTATUS 08/25/2022 FINAL 08/20/2022   REPTSTATUS 08/25/2022 FINAL 08/20/2022   GRAMSTAIN  06/04/2022    NO SQUAMOUS EPITHELIAL CELLS SEEN RARE WBC SEEN NO ORGANISMS SEEN    CULT  08/20/2022    NO GROWTH 5 DAYS Performed at [facility], [address], [place], [place] [address]    CULT  08/20/2022    NO GROWTH 5 DAYS Performed at [facility], [address], [place], [place] [address]    LABORGA METHICILLIN RESISTANT STAPHYLOCOCCUS AUREUS 06/04/2022     Lab Results  Component Value Date   ALBUMIN 2.7 (L) 02/02/2023   ALBUMIN 2.1 (L) 08/31/2022   ALBUMIN 2.0 (L) 08/22/2022    Lab Results  Component Value Date   MG 1.8 08/24/2022   MG 1.7 08/22/2022   MG 1.6 (L)  11/13/2021   No results found for: "VD25OH"  No results found for: "PREALBUMIN"    Latest Ref Rng & Units 02/02/2023    9:14 PM 08/31/2022    3:09 AM 08/30/2022    2:55 AM  CBC EXTENDED  WBC 4.0 - 10.5 K/uL 2.8  5.8  2.9   RBC 3.87 - 5.11 MIL/uL 3.12  2.67  2.61   Hemoglobin 12.0 - 15.0 g/dL 9.6  8.6  8.4   HCT 46.9 - 46.0 % 29.5  25.0  25.2   Platelets 150 - 400 K/uL 81  69  64   NEUT# 1.7 - 7.7 K/uL 1.4     Lymph# 0.7 - 4.0 K/uL 1.1        There is no height or weight on file to calculate BMI.  Orders:  No orders of the defined types were placed in this encounter.  No orders of the defined types were placed in this encounter.    Procedures: No procedures performed  Clinical Data: No additional findings.  ROS:  All other systems negative, except as noted in the HPI. Review of Systems  Objective: Vital Signs: There were no vitals taken for this visit.  Specialty Comments:  No specialty comments available.  PMFS History: Patient Active Problem List   Diagnosis Date Noted   CKD stage G3a/A3, GFR 45-59 and albumin creatinine ratio >300 mg/g (HCC) 01/24/2023   Vitamin D deficiency 01/24/2023   Diabetic osteomyelitis (HCC)    MRSA bacteremia 06/06/2022   Secondary esophageal varices with bleeding (HCC)    Cellulitis of left foot 06/02/2022   Ascites 04/05/2022   Cellulitis 04/02/2022   Lactic acidosis 04/02/2022   Chronic osteomyelitis involving ankle and foot, left (HCC) 11/09/2021   Essential hypertension 11/09/2021   Hematemesis    Liver cirrhosis secondary to NASH (HCC) 05/19/2021   Thrombocytopenia (HCC) 05/19/2021   Obesity (BMI 30-39.9) 02/15/2021   Acquired absence of left foot (HCC) 01/11/2021   Non-healing wound of amputation stump (HCC) 09/09/2020   Coronary atherosclerosis 08/04/2020   Non-prs chronic ulcer oth prt right foot w fat layer exposed (HCC) 12/24/2019   Acquired absence of other left toe(s) (HCC) 11/19/2019   COVID-19 08/20/2019    Difficulty in walking, not elsewhere classified 08/09/2019   History of peptic ulcer disease 08/09/2019   Muscle weakness (generalized) 08/09/2019   Pain in left ankle and joints of left foot 08/09/2019   Unsteadiness on feet 08/09/2019   Gastro-esophageal reflux disease without esophagitis 08/08/2019   History of urinary stone 08/08/2019   Hypomagnesemia 08/08/2019   Long term (current) use of insulin (HCC) 08/08/2019   Major depressive disorder, single episode, unspecified 08/08/2019   Peripheral vascular disease (HCC) 08/08/2019   Type 2 diabetes mellitus with diabetic neuropathy, unspecified (HCC) 08/08/2019   Stage 2 chronic kidney disease due to type 2 diabetes mellitus (HCC) 08/01/2019   Nonalcoholic fatty liver disease 05/07/2019   Type 2 diabetes mellitus treated with insulin (HCC) 05/06/2019   Depressive disorder 04/18/2019   Generalized anxiety disorder 04/18/2019   Seasonal allergic rhinitis 04/18/2019   Arthritis 04/18/2019   Mixed hyperlipidemia 12/19/2018   Past Medical History:  Diagnosis Date   Anemia    Anxiety    Aortic atherosclerosis (HCC)    Arthritis    AVM (arteriovenous malformation) of colon    Depression    Essential hypertension    GI bleeding    Recurrent   Hepatic encephalopathy (HCC)    History of kidney stones    History of RSV infection    Liver cirrhosis secondary to NASH (HCC) 2004   Biopsy-proven   Obesity    Osteomyelitis (HCC)    Peripheral vascular disease (HCC)    Thrombocytopenia (HCC)    Type 2 diabetes mellitus (HCC)     Family History  Problem Relation Age of Onset   Heart failure Mother    Hyperlipidemia Mother    Diabetes Mellitus II Mother    Heart failure Father    Hyperlipidemia Father     Past Surgical History:  Procedure Laterality Date   ABDOMINAL HYSTERECTOMY     AMPUTATION Left 04/05/2022   Procedure: Metatarsal amputation revision;  Surgeon: Louann Sjogren, MD;  Location: [facility];  Service: Podiatry;   Laterality: Left;  Surgical team to do block   AMPUTATION Left 08/26/2022   Procedure: LEFT BELOW KNEE AMPUTATION;  Surgeon: Nadara Mustard, MD;  Location: [facility] OR;  Service: Orthopedics;  Laterality: Left;   APPLICATION OF WOUND VAC Left 08/26/2022   Procedure: APPLICATION OF WOUND VAC;  Surgeon: Nadara Mustard, MD;  Location: [facility] OR;  Service: Orthopedics;  Laterality: Left;   COLONOSCOPY     Cystoscopy with ureteral stent  ESOPHAGOGASTRODUODENOSCOPY     ESOPHAGOGASTRODUODENOSCOPY (EGD) WITH PROPOFOL N/A 05/20/2021   Procedure: ESOPHAGOGASTRODUODENOSCOPY (EGD) WITH PROPOFOL;  Surgeon: Corbin Ade, MD;  [facility];  Service: Endoscopy;  Laterality: N/A;  needs intubation   IR ANGIOGRAM FOLLOW UP STUDY  05/21/2021   IR ANGIOGRAM SELECTIVE EACH ADDITIONAL VESSEL  05/21/2021   IR EMBO VENOUS NOT HEMORR HEMANG  INC GUIDE ROADMAPPING  05/21/2021   IR US GUIDE VASC ACCESS RIGHT  05/21/2021   IR VENOGRAM RENAL UNI LEFT  05/21/2021   IRRIGATION AND DEBRIDEMENT FOOT Left 06/04/2022   Procedure: REVISIONAL TRAMSMETATARSAL AMPUTATION;  Surgeon: Candelaria Stagers, DPM;  Location: [facility];  Service: Podiatry;  Laterality: Left;   RADIOLOGY WITH ANESTHESIA N/A 05/21/2021   Procedure: IR WITH ANESTHESIA;  Surgeon: Radiologist, Medication, MD;  Location: [facility] OR;  Service: Radiology;  Laterality: N/A;   Toe amputations Bilateral    TUBAL LIGATION     WOUND DEBRIDEMENT Left 11/12/2021   Procedure: DEBRIDEMENT WOUND;FIRST METATARSAL RESECTION; APPLICATION OF SKIN GRAFT SUBSTITUTE;  Surgeon: Park Liter, DPM;  Location: [facility];  Service: Podiatry;  Laterality: Left;   Social History   Occupational History   Not on file  Tobacco Use   Smoking status: Former    Types: Cigarettes   Smokeless tobacco: Never  Vaping Use   Vaping Use: Never used  Substance and Sexual Activity   Alcohol use: Yes    Comment: wine rare   Drug use: Never   Sexual activity: Not on file

## 2023-06-05 ENCOUNTER — Telehealth: Payer: Self-pay | Admitting: Orthopedic Surgery

## 2023-06-05 NOTE — Telephone Encounter (Signed)
Confirmation call that wound remain closed at 2 weeks. Calld pt and she did confirm that foot remains healed. Will call with any questions.

## 2023-06-05 NOTE — Telephone Encounter (Signed)
Spoke w/the patient, she was returning a call to Autumn.  [phone]

## 2023-06-15 ENCOUNTER — Telehealth: Payer: Self-pay | Admitting: Orthopedic Surgery

## 2023-06-15 NOTE — Telephone Encounter (Signed)
Emailed IT sales professional at [employer] about this. We do not do appeals or authorizations through insurance for Rx's to [facility]. Their office usually takes care of insurance approvals, unless we need to do different documentation to pt's OV note. Waiting for her response on this issue.

## 2023-06-15 NOTE — Telephone Encounter (Signed)
Enrique Sack (Rep) from [facility] called on behalf of pt . Pt was partially approved for prostatic and pt need to know if Dr Lajoyce Corners, PA Denny Peon, Faythe Casa. Or Atumn F. Will call back to find out if they will appeal partial approval or resubmit to insurance company. Please call pt about this matter at [phone].

## 2023-06-15 NOTE — Telephone Encounter (Signed)
Per Rayburn Ma, insurance should be the one calling them since they are the one's that initiated the authorization. She was able to find out what is going on and is having it taken care of.

## 2023-06-23 ENCOUNTER — Telehealth: Payer: Self-pay

## 2023-06-23 NOTE — Telephone Encounter (Signed)
R Heel wound has increased in size.  Kelly from [facility] called stating that last 2 visits the dressings on the wound have not been in place but they were reapplied.   Kelly call back [phone]

## 2023-06-23 NOTE — Telephone Encounter (Signed)
FYI  I called pt and made an appt for Wednesday at 4pm with Erin. If there has been a change to the heel we need to see if in the office ( she ws healed at her last visit on 05/23/2023 and then confirmed vis phone call 2 weeks later) previous registry pt.

## 2023-06-27 NOTE — Telephone Encounter (Signed)
Noted  

## 2023-06-28 ENCOUNTER — Ambulatory Visit (INDEPENDENT_AMBULATORY_CARE_PROVIDER_SITE_OTHER): Payer: Medicare Other | Admitting: Family

## 2023-06-28 DIAGNOSIS — L97411 Non-pressure chronic ulcer of right heel and midfoot limited to breakdown of skin: Secondary | ICD-10-CM | POA: Diagnosis not present

## 2023-07-10 ENCOUNTER — Encounter: Payer: Self-pay | Admitting: Family

## 2023-07-10 NOTE — Progress Notes (Signed)
Office Visit Note   Patient: Ariel Arnold           Date of Birth: 1956-09-18           MRN: [id number] Visit Date: 06/28/2023              Requested by: Joaquin Courts, DO [address] [place],  [place] [address] PCP: Joaquin Courts, DO  Chief Complaint  Patient presents with   Right Foot - Wound Check      HPI: The patient is a 67 year old woman who presents for evaluation of a right heel ulcer.  Today she is concerned that the ulcer has opened back up she was last seen in early June at that time her ulcer was considered to be healed  The patient does have home health assistance for help with her daily dose of cleansing.  Dry dressings.  Using a PRAFO to offload the heel.  Assessment & Plan: Visit Diagnoses: No diagnosis found.  Plan: Continue daily also cleansing discussed the importance of offloading the area and keeping it clean and dry no change to her current wound care  Follow-Up Instructions: No follow-ups on file.   Ortho Exam  Patient is alert, oriented, no adenopathy, well-dressed, normal affect, normal respiratory effort. On examination right heel.  Please see attached image.  There is open ulceration to the posterior medial heel this is 5 mm x 3 mm filled in with granulation 1 mm of depth there is some scant surrounding hyperkeratotic tissue no erythema warmth or sign of infection no drainage  Imaging: No results found.   Labs: Lab Results  Component Value Date   HGBA1C 11.2 (H) 04/03/2022   HGBA1C 9.4 (H) 11/10/2021   ESRSEDRATE 11 02/07/2022   ESRSEDRATE 21 11/09/2021   CRP 6.0 (H) 08/19/2022   CRP 4.7 02/07/2022   CRP 0.9 11/09/2021   REPTSTATUS 08/25/2022 FINAL 08/20/2022   REPTSTATUS 08/25/2022 FINAL 08/20/2022   GRAMSTAIN  06/04/2022    NO SQUAMOUS EPITHELIAL CELLS SEEN RARE WBC SEEN NO ORGANISMS SEEN    CULT  08/20/2022    NO GROWTH 5 DAYS Performed at [facility], [address] [place], [place] [address]     CULT  08/20/2022    NO GROWTH 5 DAYS Performed at [facility], [address], [place], [place] [address]    LABORGA METHICILLIN RESISTANT STAPHYLOCOCCUS AUREUS 06/04/2022     Lab Results  Component Value Date   ALBUMIN 2.7 (L) 02/02/2023   ALBUMIN 2.1 (L) 08/31/2022   ALBUMIN 2.0 (L) 08/22/2022    Lab Results  Component Value Date   MG 1.8 08/24/2022   MG 1.7 08/22/2022   MG 1.6 (L) 11/13/2021   No results found for: "VD25OH"  No results found for: "PREALBUMIN"    Latest Ref Rng & Units 02/02/2023    9:14 PM 08/31/2022    3:09 AM 08/30/2022    2:55 AM  CBC EXTENDED  WBC 4.0 - 10.5 K/uL 2.8  5.8  2.9   RBC 3.87 - 5.11 MIL/uL 3.12  2.67  2.61   Hemoglobin 12.0 - 15.0 g/dL 9.6  8.6  8.4   HCT 41.3 - 46.0 % 29.5  25.0  25.2   Platelets 150 - 400 K/uL 81  69  64   NEUT# 1.7 - 7.7 K/uL 1.4     Lymph# 0.7 - 4.0 K/uL 1.1        There is no height or weight  on file to calculate BMI.  Orders:  No orders of the defined types were placed in this encounter.  No orders of the defined types were placed in this encounter.    Procedures: No procedures performed  Clinical Data: No additional findings.  ROS:  All other systems negative, except as noted in the HPI. Review of Systems  Objective: Vital Signs: There were no vitals taken for this visit.  Specialty Comments:  No specialty comments available.  PMFS History: Patient Active Problem List   Diagnosis Date Noted   CKD stage G3a/A3, GFR 45-59 and albumin creatinine ratio >300 mg/g (HCC) 01/24/2023   Vitamin D deficiency 01/24/2023   Diabetic osteomyelitis (HCC)    MRSA bacteremia 06/06/2022   Secondary esophageal varices with bleeding (HCC)    Cellulitis of left foot 06/02/2022   Ascites 04/05/2022   Cellulitis 04/02/2022   Lactic acidosis 04/02/2022   Chronic osteomyelitis involving ankle and foot, left (HCC) 11/09/2021   Essential hypertension 11/09/2021   Hematemesis    Liver cirrhosis  secondary to NASH (HCC) 05/19/2021   Thrombocytopenia (HCC) 05/19/2021   Obesity (BMI 30-39.9) 02/15/2021   Acquired absence of left foot (HCC) 01/11/2021   Non-healing wound of amputation stump (HCC) 09/09/2020   Coronary atherosclerosis 08/04/2020   Non-prs chronic ulcer oth prt right foot w fat layer exposed (HCC) 12/24/2019   Acquired absence of other left toe(s) (HCC) 11/19/2019   COVID-19 08/20/2019   Difficulty in walking, not elsewhere classified 08/09/2019   History of peptic ulcer disease 08/09/2019   Muscle weakness (generalized) 08/09/2019   Pain in left ankle and joints of left foot 08/09/2019   Unsteadiness on feet 08/09/2019   Gastro-esophageal reflux disease without esophagitis 08/08/2019   History of urinary stone 08/08/2019   Hypomagnesemia 08/08/2019   Long term (current) use of insulin (HCC) 08/08/2019   Major depressive disorder, single episode, unspecified 08/08/2019   Peripheral vascular disease (HCC) 08/08/2019   Type 2 diabetes mellitus with diabetic neuropathy, unspecified (HCC) 08/08/2019   Stage 2 chronic kidney disease due to type 2 diabetes mellitus (HCC) 08/01/2019   Nonalcoholic fatty liver disease 05/07/2019   Type 2 diabetes mellitus treated with insulin (HCC) 05/06/2019   Depressive disorder 04/18/2019   Generalized anxiety disorder 04/18/2019   Seasonal allergic rhinitis 04/18/2019   Arthritis 04/18/2019   Mixed hyperlipidemia 12/19/2018   Past Medical History:  Diagnosis Date   Anemia    Anxiety    Aortic atherosclerosis (HCC)    Arthritis    AVM (arteriovenous malformation) of colon    Depression    Essential hypertension    GI bleeding    Recurrent   Hepatic encephalopathy (HCC)    History of kidney stones    History of RSV infection    Liver cirrhosis secondary to NASH (HCC) 2004   Biopsy-proven   Obesity    Osteomyelitis (HCC)    Peripheral vascular disease (HCC)    Thrombocytopenia (HCC)    Type 2 diabetes mellitus (HCC)      Family History  Problem Relation Age of Onset   Heart failure Mother    Hyperlipidemia Mother    Diabetes Mellitus II Mother    Heart failure Father    Hyperlipidemia Father     Past Surgical History:  Procedure Laterality Date   ABDOMINAL HYSTERECTOMY     AMPUTATION Left 04/05/2022   Procedure: Metatarsal amputation revision;  Surgeon: Louann Sjogren, MD;  Location: [facility];  Service: Podiatry;  Laterality: Left;  Surgical  team to do block   AMPUTATION Left 08/26/2022   Procedure: LEFT BELOW KNEE AMPUTATION;  Surgeon: Nadara Mustard, MD;  Location: [facility] OR;  Service: Orthopedics;  Laterality: Left;   APPLICATION OF WOUND VAC Left 08/26/2022   Procedure: APPLICATION OF WOUND VAC;  Surgeon: Nadara Mustard, MD;  Location: [facility];  Service: Orthopedics;  Laterality: Left;   COLONOSCOPY     Cystoscopy with ureteral stent     ESOPHAGOGASTRODUODENOSCOPY     ESOPHAGOGASTRODUODENOSCOPY (EGD) WITH PROPOFOL N/A 05/20/2021   Procedure: ESOPHAGOGASTRODUODENOSCOPY (EGD) WITH PROPOFOL;  Surgeon: Corbin Ade, MD;  [facility];  Service: Endoscopy;  Laterality: N/A;  needs intubation   IR ANGIOGRAM FOLLOW UP STUDY  05/21/2021   IR ANGIOGRAM SELECTIVE EACH ADDITIONAL VESSEL  05/21/2021   IR EMBO VENOUS NOT HEMORR HEMANG  INC GUIDE ROADMAPPING  05/21/2021   IR US GUIDE VASC ACCESS RIGHT  05/21/2021   IR VENOGRAM RENAL UNI LEFT  05/21/2021   IRRIGATION AND DEBRIDEMENT FOOT Left 06/04/2022   Procedure: REVISIONAL TRAMSMETATARSAL AMPUTATION;  Surgeon: Candelaria Stagers, DPM;  Location: [facility];  Service: Podiatry;  Laterality: Left;   RADIOLOGY WITH ANESTHESIA N/A 05/21/2021   Procedure: IR WITH ANESTHESIA;  Surgeon: Radiologist, Medication, MD;  Location: [facility];  Service: Radiology;  Laterality: N/A;   Toe amputations Bilateral    TUBAL LIGATION     WOUND DEBRIDEMENT Left 11/12/2021   Procedure: DEBRIDEMENT WOUND;FIRST METATARSAL RESECTION; APPLICATION OF SKIN GRAFT SUBSTITUTE;  Surgeon: Park Liter, DPM;   Location: [facility];  Service: Podiatry;  Laterality: Left;   Social History   Occupational History   Not on file  Tobacco Use   Smoking status: Former    Types: Cigarettes   Smokeless tobacco: Never  Vaping Use   Vaping status: Never Used  Substance and Sexual Activity   Alcohol use: Yes    Comment: wine rare   Drug use: Never   Sexual activity: Not on file

## 2023-07-18 ENCOUNTER — Ambulatory Visit (INDEPENDENT_AMBULATORY_CARE_PROVIDER_SITE_OTHER): Payer: Medicare Other | Admitting: Orthopedic Surgery

## 2023-07-18 DIAGNOSIS — L97411 Non-pressure chronic ulcer of right heel and midfoot limited to breakdown of skin: Secondary | ICD-10-CM

## 2023-07-31 ENCOUNTER — Encounter: Payer: Self-pay | Admitting: Orthopedic Surgery

## 2023-07-31 NOTE — Progress Notes (Signed)
Office Visit Note   Patient: Ariel Arnold           Date of Birth: 26-Mar-1956           MRN: [id number] Visit Date: 07/18/2023              Requested by: Ariel Courts, DO [address] [place],  [place] [address] PCP: Ariel Courts, DO  Chief Complaint  Patient presents with   Right Foot - Wound Check      HPI: Patient is a 67 year old woman who presents in follow-up for Wagner grade 1 right heel ulcer.  She has been in a PRAFO boot.  Assessment & Plan: Visit Diagnoses:  1. Ulcer of right heel, limited to breakdown of skin (HCC)     Plan: Recommend that she stop using her heel to propel herself in the wheelchair.  Continue wound care and pressure offloading.  Follow-Up Instructions: Return in about 4 weeks (around 08/15/2023).   Ortho Exam  Patient is alert, oriented, no adenopathy, well-dressed, normal affect, normal respiratory effort. Examination the right heel ulcer shows 100% healthy flat granulation tissue.  The wound measures 5 x 10 mm.  Imaging: No results found. No images are attached to the encounter.  Labs: Lab Results  Component Value Date   HGBA1C 11.2 (H) 04/03/2022   HGBA1C 9.4 (H) 11/10/2021   ESRSEDRATE 11 02/07/2022   ESRSEDRATE 21 11/09/2021   CRP 6.0 (H) 08/19/2022   CRP 4.7 02/07/2022   CRP 0.9 11/09/2021   REPTSTATUS 08/25/2022 FINAL 08/20/2022   REPTSTATUS 08/25/2022 FINAL 08/20/2022   GRAMSTAIN  06/04/2022    NO SQUAMOUS EPITHELIAL CELLS SEEN RARE WBC SEEN NO ORGANISMS SEEN    CULT  08/20/2022    NO GROWTH 5 DAYS Performed at [facility], [address] [place], [place] [address]    CULT  08/20/2022    NO GROWTH 5 DAYS Performed at [facility], [address] [place], [place] [address]    LABORGA METHICILLIN RESISTANT STAPHYLOCOCCUS AUREUS 06/04/2022     Lab Results  Component Value Date   ALBUMIN 2.7 (L) 02/02/2023   ALBUMIN 2.1 (L) 08/31/2022   ALBUMIN 2.0 (L) 08/22/2022    Lab  Results  Component Value Date   MG 1.8 08/24/2022   MG 1.7 08/22/2022   MG 1.6 (L) 11/13/2021   No results found for: "VD25OH"  No results found for: "PREALBUMIN"    Latest Ref Rng & Units 02/02/2023    9:14 PM 08/31/2022    3:09 AM 08/30/2022    2:55 AM  CBC EXTENDED  WBC 4.0 - 10.5 K/uL 2.8  5.8  2.9   RBC 3.87 - 5.11 MIL/uL 3.12  2.67  2.61   Hemoglobin 12.0 - 15.0 g/dL 9.6  8.6  8.4   HCT 86.5 - 46.0 % 29.5  25.0  25.2   Platelets 150 - 400 K/uL 81  69  64   NEUT# 1.7 - 7.7 K/uL 1.4     Lymph# 0.7 - 4.0 K/uL 1.1        There is no height or weight on file to calculate BMI.  Orders:  No orders of the defined types were placed in this encounter.  No orders of the defined types were placed in this encounter.    Procedures: No procedures performed  Clinical Data: No additional findings.  ROS:  All other systems negative, except as noted in the HPI. Review of Systems  Objective: Vital Signs: There were no vitals taken for this visit.  Specialty Comments:  No specialty comments available.  PMFS History: Patient Active Problem List   Diagnosis Date Noted   CKD stage G3a/A3, GFR 45-59 and albumin creatinine ratio >300 mg/g (HCC) 01/24/2023   Vitamin D deficiency 01/24/2023   Diabetic osteomyelitis (HCC)    MRSA bacteremia 06/06/2022   Secondary esophageal varices with bleeding (HCC)    Cellulitis of left foot 06/02/2022   Ascites 04/05/2022   Cellulitis 04/02/2022   Lactic acidosis 04/02/2022   Chronic osteomyelitis involving ankle and foot, left (HCC) 11/09/2021   Essential hypertension 11/09/2021   Hematemesis    Liver cirrhosis secondary to NASH (HCC) 05/19/2021   Thrombocytopenia (HCC) 05/19/2021   Obesity (BMI 30-39.9) 02/15/2021   Acquired absence of left foot (HCC) 01/11/2021   Non-healing wound of amputation stump (HCC) 09/09/2020   Coronary atherosclerosis 08/04/2020   Non-prs chronic ulcer oth prt right foot w fat layer exposed (HCC)  12/24/2019   Acquired absence of other left toe(s) (HCC) 11/19/2019   COVID-19 08/20/2019   Difficulty in walking, not elsewhere classified 08/09/2019   History of peptic ulcer disease 08/09/2019   Muscle weakness (generalized) 08/09/2019   Pain in left ankle and joints of left foot 08/09/2019   Unsteadiness on feet 08/09/2019   Gastro-esophageal reflux disease without esophagitis 08/08/2019   History of urinary stone 08/08/2019   Hypomagnesemia 08/08/2019   Long term (current) use of insulin (HCC) 08/08/2019   Major depressive disorder, single episode, unspecified 08/08/2019   Peripheral vascular disease (HCC) 08/08/2019   Type 2 diabetes mellitus with diabetic neuropathy, unspecified (HCC) 08/08/2019   Stage 2 chronic kidney disease due to type 2 diabetes mellitus (HCC) 08/01/2019   Nonalcoholic fatty liver disease 05/07/2019   Type 2 diabetes mellitus treated with insulin (HCC) 05/06/2019   Depressive disorder 04/18/2019   Generalized anxiety disorder 04/18/2019   Seasonal allergic rhinitis 04/18/2019   Arthritis 04/18/2019   Mixed hyperlipidemia 12/19/2018   Past Medical History:  Diagnosis Date   Anemia    Anxiety    Aortic atherosclerosis (HCC)    Arthritis    AVM (arteriovenous malformation) of colon    Depression    Essential hypertension    GI bleeding    Recurrent   Hepatic encephalopathy (HCC)    History of kidney stones    History of RSV infection    Liver cirrhosis secondary to NASH (HCC) 2004   Biopsy-proven   Obesity    Osteomyelitis (HCC)    Peripheral vascular disease (HCC)    Thrombocytopenia (HCC)    Type 2 diabetes mellitus (HCC)     Family History  Problem Relation Age of Onset   Heart failure Mother    Hyperlipidemia Mother    Diabetes Mellitus II Mother    Heart failure Father    Hyperlipidemia Father     Past Surgical History:  Procedure Laterality Date   ABDOMINAL HYSTERECTOMY     AMPUTATION Left 04/05/2022   Procedure: Metatarsal  amputation revision;  Surgeon: Louann Sjogren, MD;  Location: [facility];  Service: Podiatry;  Laterality: Left;  Surgical team to do block   AMPUTATION Left 08/26/2022   Procedure: LEFT BELOW KNEE AMPUTATION;  Surgeon: Nadara Mustard, MD;  Location[facility] OR;  Service: Orthopedics;  Laterality: Left;   APPLICATION OF WOUND VAC Left 08/26/2022   Procedure: APPLICATION OF WOUND VAC;  Surgeon: Nadara Mustard, MD;  Location: [facility] OR;  Service: Orthopedics;  Laterality: Left;   COLONOSCOPY     Cystoscopy with ureteral stent     ESOPHAGOGASTRODUODENOSCOPY     ESOPHAGOGASTRODUODENOSCOPY (EGD) WITH PROPOFOL N/A 05/20/2021   Procedure: ESOPHAGOGASTRODUODENOSCOPY (EGD) WITH PROPOFOL;  Surgeon: Corbin Ade, MD;  [facility];  Service: Endoscopy;  Laterality: N/A;  needs intubation   IR ANGIOGRAM FOLLOW UP STUDY  05/21/2021   IR ANGIOGRAM SELECTIVE EACH ADDITIONAL VESSEL  05/21/2021   IR EMBO VENOUS NOT HEMORR HEMANG  INC GUIDE ROADMAPPING  05/21/2021   IR US GUIDE VASC ACCESS RIGHT  05/21/2021   IR VENOGRAM RENAL UNI LEFT  05/21/2021   IRRIGATION AND DEBRIDEMENT FOOT Left 06/04/2022   Procedure: REVISIONAL TRAMSMETATARSAL AMPUTATION;  Surgeon: Candelaria Stagers, DPM;  Location: [facility];  Service: Podiatry;  Laterality: Left;   RADIOLOGY WITH ANESTHESIA N/A 05/21/2021   Procedure: IR WITH ANESTHESIA;  Surgeon: Radiologist, Medication, MD;  Location: [facility];  Service: Radiology;  Laterality: N/A;   Toe amputations Bilateral    TUBAL LIGATION     WOUND DEBRIDEMENT Left 11/12/2021   Procedure: DEBRIDEMENT WOUND;FIRST METATARSAL RESECTION; APPLICATION OF SKIN GRAFT SUBSTITUTE;  Surgeon: Park Liter, DPM;  Location: [facility];  Service: Podiatry;  Laterality: Left;   Social History   Occupational History   Not on file  Tobacco Use   Smoking status: Former    Types: Cigarettes   Smokeless tobacco: Never  Vaping Use   Vaping status: Never Used  Substance and Sexual Activity   Alcohol use: Yes    Comment: wine  rare   Drug use: Never   Sexual activity: Not on file

## 2023-08-02 ENCOUNTER — Ambulatory Visit (INDEPENDENT_AMBULATORY_CARE_PROVIDER_SITE_OTHER): Payer: Medicare HMO | Admitting: Podiatry

## 2023-08-02 DIAGNOSIS — Z91199 Patient's noncompliance with other medical treatment and regimen due to unspecified reason: Secondary | ICD-10-CM

## 2023-08-02 NOTE — Progress Notes (Signed)
No show

## 2023-08-06 ENCOUNTER — Encounter (HOSPITAL_COMMUNITY): Payer: Self-pay

## 2023-08-06 ENCOUNTER — Observation Stay: Admit: 2023-08-06 | Payer: Self-pay

## 2023-08-07 ENCOUNTER — Encounter (HOSPITAL_COMMUNITY): Payer: Self-pay | Admitting: Family Medicine

## 2023-08-07 ENCOUNTER — Inpatient Hospital Stay (HOSPITAL_COMMUNITY): Payer: Medicare HMO

## 2023-08-07 ENCOUNTER — Inpatient Hospital Stay (HOSPITAL_COMMUNITY)
Admission: AD | Admit: 2023-08-07 | Discharge: 2023-08-22 | DRG: 405 | Disposition: A | Discharge status: Hospice | Payer: Medicare HMO | Source: Other Acute Inpatient Hospital | Attending: Internal Medicine | Admitting: Internal Medicine

## 2023-08-07 ENCOUNTER — Other Ambulatory Visit: Payer: Self-pay

## 2023-08-07 DIAGNOSIS — I9581 Postprocedural hypotension: Secondary | ICD-10-CM | POA: Diagnosis not present

## 2023-08-07 DIAGNOSIS — R188 Other ascites: Secondary | ICD-10-CM | POA: Diagnosis present

## 2023-08-07 DIAGNOSIS — K7581 Nonalcoholic steatohepatitis (NASH): Secondary | ICD-10-CM | POA: Diagnosis present

## 2023-08-07 DIAGNOSIS — L8990 Pressure ulcer of unspecified site, unspecified stage: Secondary | ICD-10-CM | POA: Insufficient documentation

## 2023-08-07 DIAGNOSIS — I10 Essential (primary) hypertension: Secondary | ICD-10-CM | POA: Diagnosis not present

## 2023-08-07 DIAGNOSIS — Z01818 Encounter for other preprocedural examination: Secondary | ICD-10-CM | POA: Diagnosis not present

## 2023-08-07 DIAGNOSIS — Z8719 Personal history of other diseases of the digestive system: Secondary | ICD-10-CM

## 2023-08-07 DIAGNOSIS — L89612 Pressure ulcer of right heel, stage 2: Secondary | ICD-10-CM | POA: Diagnosis present

## 2023-08-07 DIAGNOSIS — R0602 Shortness of breath: Secondary | ICD-10-CM | POA: Diagnosis present

## 2023-08-07 DIAGNOSIS — Z515 Encounter for palliative care: Secondary | ICD-10-CM | POA: Diagnosis not present

## 2023-08-07 DIAGNOSIS — J9601 Acute respiratory failure with hypoxia: Secondary | ICD-10-CM | POA: Diagnosis present

## 2023-08-07 DIAGNOSIS — J9811 Atelectasis: Secondary | ICD-10-CM | POA: Diagnosis not present

## 2023-08-07 DIAGNOSIS — L89152 Pressure ulcer of sacral region, stage 2: Secondary | ICD-10-CM | POA: Diagnosis present

## 2023-08-07 DIAGNOSIS — J918 Pleural effusion in other conditions classified elsewhere: Secondary | ICD-10-CM | POA: Diagnosis present

## 2023-08-07 DIAGNOSIS — Z79899 Other long term (current) drug therapy: Secondary | ICD-10-CM

## 2023-08-07 DIAGNOSIS — R197 Diarrhea, unspecified: Secondary | ICD-10-CM | POA: Diagnosis not present

## 2023-08-07 DIAGNOSIS — I129 Hypertensive chronic kidney disease with stage 1 through stage 4 chronic kidney disease, or unspecified chronic kidney disease: Secondary | ICD-10-CM | POA: Diagnosis present

## 2023-08-07 DIAGNOSIS — K7460 Unspecified cirrhosis of liver: Secondary | ICD-10-CM | POA: Diagnosis present

## 2023-08-07 DIAGNOSIS — Z833 Family history of diabetes mellitus: Secondary | ICD-10-CM

## 2023-08-07 DIAGNOSIS — F329 Major depressive disorder, single episode, unspecified: Secondary | ICD-10-CM | POA: Diagnosis present

## 2023-08-07 DIAGNOSIS — J90 Pleural effusion, not elsewhere classified: Secondary | ICD-10-CM | POA: Insufficient documentation

## 2023-08-07 DIAGNOSIS — I8510 Secondary esophageal varices without bleeding: Secondary | ICD-10-CM | POA: Diagnosis present

## 2023-08-07 DIAGNOSIS — Z8249 Family history of ischemic heart disease and other diseases of the circulatory system: Secondary | ICD-10-CM

## 2023-08-07 DIAGNOSIS — D631 Anemia in chronic kidney disease: Secondary | ICD-10-CM | POA: Diagnosis present

## 2023-08-07 DIAGNOSIS — K7469 Other cirrhosis of liver: Secondary | ICD-10-CM | POA: Diagnosis present

## 2023-08-07 DIAGNOSIS — I700 Atherosclerosis of aorta: Secondary | ICD-10-CM | POA: Diagnosis present

## 2023-08-07 DIAGNOSIS — E1140 Type 2 diabetes mellitus with diabetic neuropathy, unspecified: Secondary | ICD-10-CM | POA: Diagnosis present

## 2023-08-07 DIAGNOSIS — R54 Age-related physical debility: Secondary | ICD-10-CM | POA: Diagnosis present

## 2023-08-07 DIAGNOSIS — E876 Hypokalemia: Secondary | ICD-10-CM | POA: Diagnosis present

## 2023-08-07 DIAGNOSIS — Z7189 Other specified counseling: Secondary | ICD-10-CM | POA: Diagnosis not present

## 2023-08-07 DIAGNOSIS — Z91048 Other nonmedicinal substance allergy status: Secondary | ICD-10-CM

## 2023-08-07 DIAGNOSIS — J948 Other specified pleural conditions: Secondary | ICD-10-CM | POA: Diagnosis present

## 2023-08-07 DIAGNOSIS — I342 Nonrheumatic mitral (valve) stenosis: Secondary | ICD-10-CM | POA: Diagnosis not present

## 2023-08-07 DIAGNOSIS — E871 Hypo-osmolality and hyponatremia: Secondary | ICD-10-CM | POA: Diagnosis present

## 2023-08-07 DIAGNOSIS — Z89411 Acquired absence of right great toe: Secondary | ICD-10-CM

## 2023-08-07 DIAGNOSIS — E1151 Type 2 diabetes mellitus with diabetic peripheral angiopathy without gangrene: Secondary | ICD-10-CM | POA: Diagnosis present

## 2023-08-07 DIAGNOSIS — Z6823 Body mass index (BMI) 23.0-23.9, adult: Secondary | ICD-10-CM

## 2023-08-07 DIAGNOSIS — Z881 Allergy status to other antibiotic agents status: Secondary | ICD-10-CM

## 2023-08-07 DIAGNOSIS — Z66 Do not resuscitate: Secondary | ICD-10-CM | POA: Diagnosis present

## 2023-08-07 DIAGNOSIS — Z83438 Family history of other disorder of lipoprotein metabolism and other lipidemia: Secondary | ICD-10-CM

## 2023-08-07 DIAGNOSIS — Z794 Long term (current) use of insulin: Secondary | ICD-10-CM

## 2023-08-07 DIAGNOSIS — Z789 Other specified health status: Secondary | ICD-10-CM | POA: Diagnosis not present

## 2023-08-07 DIAGNOSIS — D6959 Other secondary thrombocytopenia: Secondary | ICD-10-CM | POA: Diagnosis present

## 2023-08-07 DIAGNOSIS — E1122 Type 2 diabetes mellitus with diabetic chronic kidney disease: Secondary | ICD-10-CM | POA: Diagnosis present

## 2023-08-07 DIAGNOSIS — E782 Mixed hyperlipidemia: Secondary | ICD-10-CM | POA: Diagnosis present

## 2023-08-07 DIAGNOSIS — K766 Portal hypertension: Secondary | ICD-10-CM | POA: Diagnosis present

## 2023-08-07 DIAGNOSIS — N1831 Chronic kidney disease, stage 3a: Secondary | ICD-10-CM | POA: Diagnosis not present

## 2023-08-07 DIAGNOSIS — J810 Acute pulmonary edema: Secondary | ICD-10-CM | POA: Diagnosis present

## 2023-08-07 DIAGNOSIS — R531 Weakness: Secondary | ICD-10-CM | POA: Diagnosis not present

## 2023-08-07 DIAGNOSIS — R627 Adult failure to thrive: Secondary | ICD-10-CM | POA: Diagnosis present

## 2023-08-07 DIAGNOSIS — K7290 Hepatic failure, unspecified without coma: Secondary | ICD-10-CM | POA: Diagnosis not present

## 2023-08-07 DIAGNOSIS — Z87442 Personal history of urinary calculi: Secondary | ICD-10-CM

## 2023-08-07 DIAGNOSIS — D684 Acquired coagulation factor deficiency: Secondary | ICD-10-CM | POA: Diagnosis present

## 2023-08-07 DIAGNOSIS — L89512 Pressure ulcer of right ankle, stage 2: Secondary | ICD-10-CM | POA: Diagnosis present

## 2023-08-07 DIAGNOSIS — D696 Thrombocytopenia, unspecified: Secondary | ICD-10-CM | POA: Diagnosis present

## 2023-08-07 DIAGNOSIS — Z87891 Personal history of nicotine dependence: Secondary | ICD-10-CM

## 2023-08-07 DIAGNOSIS — Z90710 Acquired absence of both cervix and uterus: Secondary | ICD-10-CM

## 2023-08-07 DIAGNOSIS — F419 Anxiety disorder, unspecified: Secondary | ICD-10-CM | POA: Diagnosis present

## 2023-08-07 DIAGNOSIS — Z89512 Acquired absence of left leg below knee: Secondary | ICD-10-CM

## 2023-08-07 DIAGNOSIS — Z5986 Financial insecurity: Secondary | ICD-10-CM

## 2023-08-07 DIAGNOSIS — L299 Pruritus, unspecified: Secondary | ICD-10-CM | POA: Diagnosis not present

## 2023-08-07 DIAGNOSIS — Z8711 Personal history of peptic ulcer disease: Secondary | ICD-10-CM

## 2023-08-07 LAB — GLUCOSE, CAPILLARY
Glucose-Capillary: 349 mg/dL — ABNORMAL HIGH (ref 70–99)
Glucose-Capillary: 319 mg/dL — ABNORMAL HIGH (ref 70–99)

## 2023-08-07 MED ORDER — SERTRALINE HCL 50 MG PO TABS
150.0000 mg | ORAL_TABLET | Freq: Every day | ORAL | Status: DC
  Administered 2023-08-08 – 2023-08-22 (×15): 150 mg via ORAL
  Filled 2023-08-07 (×15): qty 1

## 2023-08-07 MED ORDER — GABAPENTIN 300 MG PO CAPS
600.0000 mg | ORAL_CAPSULE | Freq: Three times a day (TID) | ORAL | Status: DC | PRN
  Administered 2023-08-10 – 2023-08-16 (×2): 600 mg via ORAL
  Filled 2023-08-07 (×3): qty 2

## 2023-08-07 MED ORDER — CARVEDILOL 6.25 MG PO TABS
6.2500 mg | ORAL_TABLET | Freq: Two times a day (BID) | ORAL | Status: DC
  Administered 2023-08-07 – 2023-08-09 (×4): 6.25 mg via ORAL
  Filled 2023-08-07 (×4): qty 1

## 2023-08-07 MED ORDER — FUROSEMIDE 20 MG PO TABS
20.0000 mg | ORAL_TABLET | Freq: Every day | ORAL | Status: DC
  Administered 2023-08-08 – 2023-08-09 (×2): 20 mg via ORAL
  Filled 2023-08-07 (×2): qty 1

## 2023-08-07 MED ORDER — INSULIN ASPART 100 UNIT/ML IJ SOLN
0.0000 [IU] | INTRAMUSCULAR | Status: DC
  Administered 2023-08-07 (×2): 7 [IU] via SUBCUTANEOUS
  Administered 2023-08-08 (×2): 3 [IU] via SUBCUTANEOUS

## 2023-08-07 MED ORDER — ONDANSETRON HCL 4 MG PO TABS: 4.0000 mg | ORAL_TABLET | Freq: Four times a day (QID) | ORAL | Status: DC | PRN

## 2023-08-07 MED ORDER — LACTULOSE 10 GM/15ML PO SOLN
10.0000 g | Freq: Two times a day (BID) | ORAL | Status: DC
  Administered 2023-08-07 – 2023-08-09 (×4): 10 g via ORAL
  Filled 2023-08-07 (×3): qty 15

## 2023-08-07 MED ORDER — FOLIC ACID 1 MG PO TABS
1.0000 mg | ORAL_TABLET | Freq: Two times a day (BID) | ORAL | Status: DC
  Administered 2023-08-07 – 2023-08-22 (×29): 1 mg via ORAL
  Filled 2023-08-07 (×29): qty 1

## 2023-08-07 MED ORDER — PRAVASTATIN SODIUM 10 MG PO TABS
20.0000 mg | ORAL_TABLET | Freq: Every day | ORAL | Status: DC
  Administered 2023-08-07 – 2023-08-21 (×14): 20 mg via ORAL
  Filled 2023-08-07 (×3): qty 2
  Filled 2023-08-07: qty 1
  Filled 2023-08-07 (×5): qty 2
  Filled 2023-08-07: qty 1
  Filled 2023-08-07 (×3): qty 2
  Filled 2023-08-07: qty 1

## 2023-08-07 MED ORDER — FENTANYL CITRATE PF 50 MCG/ML IJ SOSY
12.5000 ug | PREFILLED_SYRINGE | INTRAMUSCULAR | Status: DC | PRN
  Administered 2023-08-10: 25 ug via INTRAVENOUS
  Administered 2023-08-10: 50 ug via INTRAVENOUS
  Filled 2023-08-07 (×2): qty 1

## 2023-08-07 MED ORDER — ACETAMINOPHEN 325 MG PO TABS: 325.0000 mg | ORAL_TABLET | Freq: Four times a day (QID) | ORAL | Status: DC | PRN

## 2023-08-07 MED ORDER — HYDROXYZINE HCL 25 MG PO TABS
25.0000 mg | ORAL_TABLET | Freq: Three times a day (TID) | ORAL | Status: DC | PRN
  Administered 2023-08-12: 25 mg via ORAL
  Filled 2023-08-07: qty 1

## 2023-08-07 MED ORDER — INSULIN GLARGINE-YFGN 100 UNIT/ML ~~LOC~~ SOLN
20.0000 [IU] | Freq: Every day | SUBCUTANEOUS | Status: DC
  Administered 2023-08-07 – 2023-08-21 (×13): 20 [IU] via SUBCUTANEOUS
  Filled 2023-08-07 (×18): qty 0.2

## 2023-08-07 MED ORDER — ONDANSETRON HCL 4 MG/2ML IJ SOLN
4.0000 mg | Freq: Four times a day (QID) | INTRAMUSCULAR | Status: DC | PRN
  Administered 2023-08-11 – 2023-08-16 (×4): 4 mg via INTRAVENOUS
  Filled 2023-08-07 (×4): qty 2

## 2023-08-07 MED ORDER — OXYCODONE HCL 5 MG PO TABS
5.0000 mg | ORAL_TABLET | ORAL | Status: DC | PRN
  Administered 2023-08-10 – 2023-08-12 (×3): 5 mg via ORAL
  Filled 2023-08-07 (×3): qty 1

## 2023-08-07 MED ORDER — PANTOPRAZOLE SODIUM 40 MG PO TBEC
40.0000 mg | DELAYED_RELEASE_TABLET | Freq: Every day | ORAL | Status: DC
  Administered 2023-08-08 – 2023-08-22 (×15): 40 mg via ORAL
  Filled 2023-08-07 (×15): qty 1

## 2023-08-07 MED ORDER — SPIRONOLACTONE 25 MG PO TABS
50.0000 mg | ORAL_TABLET | Freq: Every day | ORAL | Status: DC
  Administered 2023-08-08 – 2023-08-09 (×2): 50 mg via ORAL
  Filled 2023-08-07 (×2): qty 2

## 2023-08-07 MED ORDER — ACETAMINOPHEN 650 MG RE SUPP: 325.0000 mg | Freq: Four times a day (QID) | RECTAL | Status: DC | PRN

## 2023-08-07 MED ORDER — ALBUTEROL SULFATE (2.5 MG/3ML) 0.083% IN NEBU: 2.5000 mg | INHALATION_SOLUTION | RESPIRATORY_TRACT | Status: DC | PRN

## 2023-08-07 NOTE — H&P (Signed)
History and Physical    [name] LFY:[id number] DOB: 04-08-1956 DOA: 08/07/2023  PCP: Joaquin Courts, DO   Patient coming from: Home   Chief Complaint: SOB   HPI: Ariel Arnold is a pleasant 67 y.o. female with medical history significant for NASH cirrhosis with ascites, insulin-dependent diabetes mellitus, depression, anxiety, and right heel ulcer who presented to [facility] on 08/01/2023 with shortness of breath.  Patient reported that she had undergone her first paracentesis approximately a week earlier and has since developed progressive shortness of breath with chest discomfort.  She was having mild nonproductive cough but no fever or chills.  [facility] Hospital Course: Upon arrival to the ED, patient was found to have large right pleural effusion and a new supplemental oxygen requirement.  She was admitted to the hospitalist service and underwent therapeutic thoracenteses on 08/02/2023 and again on 08/04/2023.  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  Dr. Archer Asa of IR agreed to see the patient in consultation for consideration of TIPS.  She was transferred to [facility] under the hospitalist service for IR evaluation.  Review of Systems:  All other systems reviewed and apart from HPI, are negative.  Past Medical History:  Diagnosis Date   Anemia    Anxiety    Aortic atherosclerosis (HCC)    Arthritis    AVM (arteriovenous malformation) of colon    Depression    Essential hypertension    GI bleeding    Recurrent   Hepatic encephalopathy (HCC)    History of kidney stones    History of RSV infection    Liver cirrhosis secondary to NASH (HCC) 2004   Biopsy-proven   Obesity    Osteomyelitis (HCC)    Peripheral vascular disease (HCC)    Thrombocytopenia (HCC)    Type 2 diabetes mellitus (HCC)     Past Surgical History:  Procedure Laterality Date   ABDOMINAL HYSTERECTOMY     AMPUTATION Left 04/05/2022   Procedure: Metatarsal  amputation revision;  Surgeon: Louann Sjogren, MD;  Location: [facility];  Service: Podiatry;  Laterality: Left;  Surgical team to do block   AMPUTATION Left 08/26/2022   Procedure: LEFT BELOW KNEE AMPUTATION;  Surgeon: Nadara Mustard, MD;  Location: [facility] OR;  Service: Orthopedics;  Laterality: Left;   APPLICATION OF WOUND VAC Left 08/26/2022   Procedure: APPLICATION OF WOUND VAC;  Surgeon: Nadara Mustard, MD;  Location[facility];  Service: Orthopedics;  Laterality: Left;   COLONOSCOPY     Cystoscopy with ureteral stent     ESOPHAGOGASTRODUODENOSCOPY     ESOPHAGOGASTRODUODENOSCOPY (EGD) WITH PROPOFOL N/A 05/20/2021   Procedure: ESOPHAGOGASTRODUODENOSCOPY (EGD) WITH PROPOFOL;  Surgeon: Corbin Ade, MD;  [facility];  Service: Endoscopy;  Laterality: N/A;  needs intubation   IR ANGIOGRAM FOLLOW UP STUDY  05/21/2021   IR ANGIOGRAM SELECTIVE EACH ADDITIONAL VESSEL  05/21/2021   IR EMBO VENOUS NOT HEMORR HEMANG  INC GUIDE ROADMAPPING  05/21/2021   IR US GUIDE VASC ACCESS RIGHT  05/21/2021   IR VENOGRAM RENAL UNI LEFT  05/21/2021   IRRIGATION AND DEBRIDEMENT FOOT Left 06/04/2022   Procedure: REVISIONAL TRAMSMETATARSAL AMPUTATION;  Surgeon: Candelaria Stagers, DPM;  Location: [facility];  Service: Podiatry;  Laterality: Left;   RADIOLOGY WITH ANESTHESIA N/A 05/21/2021   Procedure: IR WITH ANESTHESIA;  Surgeon: Radiologist, Medication, MD;  Location: [facility];  Service: Radiology;  Laterality: N/A;   Toe amputations Bilateral    TUBAL LIGATION  WOUND DEBRIDEMENT Left 11/12/2021   Procedure: DEBRIDEMENT WOUND;FIRST METATARSAL RESECTION; APPLICATION OF SKIN GRAFT SUBSTITUTE;  Surgeon: Park Liter, DPM;  Location: [facility];  Service: Podiatry;  Laterality: Left;    Social History:   reports that she has quit smoking. Her smoking use included cigarettes. She has never used smokeless tobacco. She reports current alcohol use. She reports that she does not use drugs.  Allergies  Allergen Reactions   Chlorhexidine  Rash   Tape Rash    Family History  Problem Relation Age of Onset   Heart failure Mother    Hyperlipidemia Mother    Diabetes Mellitus II Mother    Heart failure Father    Hyperlipidemia Father      Prior to Admission medications   Medication Sig Start Date End Date Taking? Authorizing Provider  ACCU-CHEK GUIDE test strip  08/27/21   [provider]  Accu-Chek Softclix Lancets lancets SMARTSIG:2 Topical Twice Daily 08/27/21   [provider]  albuterol (VENTOLIN HFA) 108 (90 Base) MCG/ACT inhaler Inhale 2 puffs into the lungs every 4 (four) hours as needed for shortness of breath or wheezing. 01/11/21   [provider]  Blood Glucose Monitoring Suppl (ACCU-CHEK GUIDE) w/Device KIT  08/27/21   [provider]  Calcium Carbonate-Vit D-Min (CALCIUM 1200) 1200-1000 MG-UNIT CHEW Chew 2 tablets by mouth daily.    [provider]  carvedilol (COREG) 6.25 MG tablet Take 1 tablet (6.25 mg total) by mouth 2 (two) times daily. 04/11/22 08/21/23  Jerald Kief, MD  clotrimazole-betamethasone (LOTRISONE) cream Apply 1 Application topically daily as needed (for yeast/rash). 11/25/21   [provider]  diclofenac Sodium (VOLTAREN) 1 % GEL Apply 2 g topically in the morning and at bedtime. 12/02/21   [provider]  ferrous sulfate 325 (65 FE) MG tablet Take 325 mg by mouth in the morning and at bedtime.    [provider]  folic acid (FOLVITE) 1 MG tablet Take 1 mg by mouth 2 (two) times daily. 06/02/21   [provider]  furosemide (LASIX) 20 MG tablet Take 20 mg by mouth once a week. 05/27/21   [provider]  gabapentin (NEURONTIN) 600 MG tablet Take 600 mg by mouth 3 (three) times daily as needed (nerve pain). 07/23/21   [provider]  GLOBAL EASE INJECT PEN NEEDLES 32G X 4 MM MISC  08/27/21   [provider]  hydrOXYzine (ATARAX) 25 MG tablet Take 25 mg by mouth daily. 08/17/22   [provider]   insulin aspart (NOVOLOG) 100 UNIT/ML injection 0-20 Units, Subcutaneous, 3 times daily with meal CBG < 70: Implement Hypoglycemia measures CBG 70 - 120: 0 units CBG 121 - 150: 3 units CBG 151 - 200: 4 units CBG 201 - 250: 7 units CBG 251 - 300: 11 units CBG 301 - 350: 15 units CBG 351 - 400: 20 units CBG > 400: call MD 08/31/22   Maretta Bees, MD  ketorolac (ACULAR) 0.5 % ophthalmic solution Place 1 drop into both eyes 4 (four) times daily as needed (dry eyes). 09/06/21   [provider]  lactulose (CHRONULAC) 10 GM/15ML solution Take 15 mLs (10 g total) by mouth 2 (two) times daily. for ammonia levels 08/31/22   Ghimire, Werner Lean, MD  oxyCODONE (OXY IR/ROXICODONE) 5 MG immediate release tablet Take 1 tablet (5 mg total) by mouth every 6 (six) hours as needed for moderate pain (pain score 4-6). 09/13/22   Adonis Huguenin,  NP  pantoprazole (PROTONIX) 40 MG tablet Take 40 mg by mouth daily as needed (heartburn). 05/27/21   [provider]  pravastatin (PRAVACHOL) 20 MG tablet Take 1 tablet (20 mg total) by mouth at bedtime. Take after completion of daptomycin 01/03/22   [name], Rebekah Chesterfield, MD  rifaximin (XIFAXAN) 550 MG TABS tablet Take 1 tablet (550 mg total) by mouth 2 (two) times daily. 08/31/22   Ghimire, Werner Lean, MD  sertraline (ZOLOFT) 100 MG tablet Take 2 tablets (200 mg total) by mouth daily. Patient taking differently: Take 150 mg by mouth daily. 06/08/22 08/21/23  Erick Blinks, MD  sodium hypochlorite (DAKIN'S 1/2 STRENGTH) external solution Irrigate with 1 Application as directed daily. 03/08/23   Candelaria Stagers, DPM  spironolactone (ALDACTONE) 50 MG tablet Take 50 mg by mouth daily as needed (swelling). 05/27/21   [provider]  TOUJEO MAX SOLOSTAR 300 UNIT/ML Solostar Pen Inject 20 Units into the skin at bedtime. 08/31/22   Ghimire, Werner Lean, MD  vitamin C (ASCORBIC ACID) 500 MG tablet Take 500 mg by mouth 2 (two) times daily.    [provider]  Vitamin D,  Ergocalciferol, (DRISDOL) 1.25 MG (50000 UNIT) CAPS capsule Take 1 capsule by mouth every 7 (seven) days. Mondays 04/05/21   [provider]    Physical Exam: Vitals:   08/07/23 1507  BP: 119/68  Pulse: 80  Resp: 20  Temp: 98.1 F (36.7 C)  TempSrc: Oral  SpO2: 93%    Constitutional: NAD, calm  Eyes: PERTLA, lids and conjunctivae normal ENMT: Mucous membranes are moist. Posterior pharynx clear of any exudate or lesions.   Neck: supple, no masses  Respiratory: Diminished on right, no wheezing. No accessory muscle use.  Cardiovascular: S1 & S2 heard, regular rate and rhythm. No extremity edema.   Abdomen: Soft, distended, non-tender. Bowel sounds active.  Musculoskeletal: no clubbing / cyanosis. S/p left BKA.   Skin: no significant rashes, lesions, ulcers. Warm, dry, well-perfused. Neurologic: CN 2-12 grossly intact. Moving all extremities. Alert and oriented.  Psychiatric: Pleasant. Cooperative.    Labs and Imaging on Admission: I have personally reviewed following labs and imaging studies  CBC: No results for input(s): "WBC", "NEUTROABS", "HGB", "HCT", "MCV", "PLT" in the last 168 hours. Basic Metabolic Panel: No results for input(s): "NA", "K", "CL", "CO2", "GLUCOSE", "BUN", "CREATININE", "CALCIUM", "MG", "PHOS" in the last 168 hours. GFR: CrCl cannot be calculated (Patient's most recent lab result is older than the maximum 21 days allowed.). Liver Function Tests: No results for input(s): "AST", "ALT", "ALKPHOS", "BILITOT", "PROT", "ALBUMIN" in the last 168 hours. No results for input(s): "LIPASE", "AMYLASE" in the last 168 hours. No results for input(s): "AMMONIA" in the last 168 hours. Coagulation Profile: No results for input(s): "INR", "PROTIME" in the last 168 hours. Cardiac Enzymes: No results for input(s): "CKTOTAL", "CKMB", "CKMBINDEX", "TROPONINI" in the last 168 hours. BNP (last 3 results) No results for input(s): "PROBNP" in the last 8760  hours. HbA1C: No results for input(s): "HGBA1C" in the last 72 hours. CBG: No results for input(s): "GLUCAP" in the last 168 hours. Lipid Profile: No results for input(s): "CHOL", "HDL", "LDLCALC", "TRIG", "CHOLHDL", "LDLDIRECT" in the last 72 hours. Thyroid Function Tests: No results for input(s): "TSH", "T4TOTAL", "FREET4", "T3FREE", "THYROIDAB" in the last 72 hours. Anemia Panel: No results for input(s): "VITAMINB12", "FOLATE", "FERRITIN", "TIBC", "IRON", "RETICCTPCT" in the last 72 hours. Urine analysis:    Component Value Date/Time   COLORURINE YELLOW 08/21/2022 0504   APPEARANCEUR CLEAR  08/21/2022 0504   LABSPEC 1.024 08/21/2022 0504   PHURINE 5.0 08/21/2022 0504   GLUCOSEU NEGATIVE 08/21/2022 0504   HGBUR NEGATIVE 08/21/2022 0504   BILIRUBINUR NEGATIVE 08/21/2022 0504   KETONESUR NEGATIVE 08/21/2022 0504   PROTEINUR NEGATIVE 08/21/2022 0504   NITRITE NEGATIVE 08/21/2022 0504   LEUKOCYTESUR TRACE (A) 08/21/2022 0504   Sepsis Labs: @LABRCNTIP (procalcitonin:4,lacticidven:4) )No results found for this or any previous visit (from the past 240 hour(s)).   Radiological Exams on Admission: No results found.   Assessment/Plan   1. Decompensated liver cirrhosis; hepatic hydrothorax; acute hypoxic respiratory failure  - Presents from [facility] where she underwent therapeutic right thoracentesis on 8/14 and 08/04/23 - There was concern for hepatic hydrothorax, possibility of TIPS procedure was discussed, and Dr. Archer Asa of IR agreed to see patient in consultation  - Continue Coreg, diuretics, and lactulose, keep NPO after midnight, consult IR    2. Insulin-dependent DM  - A1c was 5.1% earlier this month; serum glucose 242 this am - Check CBGs and continue insulin    3. Depression, anxiety  - Continue Zoloft and as-needed hydroxyzine    4. Thrombocytopenia  - Platelets appear stable at 72 this am  - Likely d/t chronic liver disease  - Monitor, transfuse if needed     5. CKD 3A  - SCr 1.09 this am, appears close to baseline  - Renally-dose medications, monitor     DVT prophylaxis: SCD Code Status: DNR  Level of Care: Level of care: Med-Surg Family Communication: Daughter at bedside  Disposition Plan:  Patient is from: home  Anticipated d/c is to: TBD Anticipated d/c date is: 08/09/23  Patient currently: Pending IR evaluation for possible TIPS  Consults called: None  Admission status: Inpatient     Briscoe Deutscher, MD [facility]  08/07/2023, 3:51 PM

## 2023-08-07 NOTE — Plan of Care (Signed)
  Problem: Education: Goal: Knowledge of General Education information will improve Description Including pain rating scale, medication(s)/side effects and non-pharmacologic comfort measures Outcome: Progressing   

## 2023-08-07 NOTE — Progress Notes (Signed)
Notified by IR PA that TIPS is not done at [facility] and that patient would need to be transferred to [facility]. Transfer orders placed.

## 2023-08-08 ENCOUNTER — Inpatient Hospital Stay (HOSPITAL_COMMUNITY): Payer: Medicare HMO

## 2023-08-08 DIAGNOSIS — L8990 Pressure ulcer of unspecified site, unspecified stage: Secondary | ICD-10-CM | POA: Insufficient documentation

## 2023-08-08 DIAGNOSIS — E1140 Type 2 diabetes mellitus with diabetic neuropathy, unspecified: Secondary | ICD-10-CM | POA: Diagnosis not present

## 2023-08-08 DIAGNOSIS — I342 Nonrheumatic mitral (valve) stenosis: Secondary | ICD-10-CM | POA: Diagnosis not present

## 2023-08-08 DIAGNOSIS — Z01818 Encounter for other preprocedural examination: Secondary | ICD-10-CM | POA: Diagnosis not present

## 2023-08-08 DIAGNOSIS — K7290 Hepatic failure, unspecified without coma: Secondary | ICD-10-CM | POA: Diagnosis not present

## 2023-08-08 DIAGNOSIS — I10 Essential (primary) hypertension: Secondary | ICD-10-CM

## 2023-08-08 DIAGNOSIS — D696 Thrombocytopenia, unspecified: Secondary | ICD-10-CM

## 2023-08-08 DIAGNOSIS — J90 Pleural effusion, not elsewhere classified: Secondary | ICD-10-CM | POA: Diagnosis not present

## 2023-08-08 DIAGNOSIS — N1831 Chronic kidney disease, stage 3a: Secondary | ICD-10-CM

## 2023-08-08 DIAGNOSIS — J9601 Acute respiratory failure with hypoxia: Secondary | ICD-10-CM | POA: Diagnosis not present

## 2023-08-08 LAB — CBC
HCT: 26.6 % — ABNORMAL LOW (ref 36.0–46.0)
Hemoglobin: 8.7 g/dL — ABNORMAL LOW (ref 12.0–15.0)
MCH: 31.6 pg (ref 26.0–34.0)
MCHC: 32.7 g/dL (ref 30.0–36.0)
MCV: 96.7 fL (ref 80.0–100.0)
Platelets: 80 10*3/uL — ABNORMAL LOW (ref 150–400)
RBC: 2.75 MIL/uL — ABNORMAL LOW (ref 3.87–5.11)
RDW: 15 % (ref 11.5–15.5)
WBC: 3.1 10*3/uL — ABNORMAL LOW (ref 4.0–10.5)
nRBC: 0 % (ref 0.0–0.2)

## 2023-08-08 LAB — COMPREHENSIVE METABOLIC PANEL
ALT: 15 U/L (ref 0–44)
AST: 21 U/L (ref 15–41)
Albumin: 2.2 g/dL — ABNORMAL LOW (ref 3.5–5.0)
Alkaline Phosphatase: 76 U/L (ref 38–126)
Anion gap: 6 (ref 5–15)
BUN: 27 mg/dL — ABNORMAL HIGH (ref 8–23)
CO2: 29 mmol/L (ref 22–32)
Calcium: 8.3 mg/dL — ABNORMAL LOW (ref 8.9–10.3)
Chloride: 99 mmol/L (ref 98–111)
Creatinine, Ser: 1.29 mg/dL — ABNORMAL HIGH (ref 0.44–1.00)
GFR, Estimated: 45 mL/min — ABNORMAL LOW (ref >60)
Glucose, Bld: 281 mg/dL — ABNORMAL HIGH (ref 70–99)
Potassium: 3.9 mmol/L (ref 3.5–5.1)
Sodium: 134 mmol/L — ABNORMAL LOW (ref 135–145)
Total Bilirubin: 1.4 mg/dL — ABNORMAL HIGH (ref 0.3–1.2)
Total Protein: 6 g/dL — ABNORMAL LOW (ref 6.5–8.1)

## 2023-08-08 LAB — GLUCOSE, CAPILLARY
Glucose-Capillary: 220 mg/dL — ABNORMAL HIGH (ref 70–99)
Glucose-Capillary: 229 mg/dL — ABNORMAL HIGH (ref 70–99)
Glucose-Capillary: 231 mg/dL — ABNORMAL HIGH (ref 70–99)
Glucose-Capillary: 246 mg/dL — ABNORMAL HIGH (ref 70–99)
Glucose-Capillary: 250 mg/dL — ABNORMAL HIGH (ref 70–99)
Glucose-Capillary: 281 mg/dL — ABNORMAL HIGH (ref 70–99)

## 2023-08-08 LAB — ECHOCARDIOGRAM COMPLETE
AR max vel: 2.04 cm2
AV Peak grad: 7.6 mmHg
Ao pk vel: 1.38 m/s
Area-P 1/2: 2.99 cm2
Height: 68 in
MV M vel: 1.97 m/s
MV Peak grad: 15.5 mmHg
MV VTI: 1.33 cm2
S' Lateral: 2.8 cm
Weight: 2913.6 oz

## 2023-08-08 LAB — AMMONIA: Ammonia: 34 umol/L (ref 9–35)

## 2023-08-08 LAB — PROTIME-INR
INR: 1.5 — ABNORMAL HIGH (ref 0.8–1.2)
Prothrombin Time: 18.6 s — ABNORMAL HIGH (ref 11.4–15.2)

## 2023-08-08 LAB — HEMOGLOBIN A1C
Hgb A1c MFr Bld: 5 % (ref 4.8–5.6)
Mean Plasma Glucose: 96.8 mg/dL

## 2023-08-08 LAB — HIV ANTIBODY (ROUTINE TESTING W REFLEX): HIV Screen 4th Generation wRfx: NONREACTIVE

## 2023-08-08 LAB — MAGNESIUM: Magnesium: 1.4 mg/dL — ABNORMAL LOW (ref 1.7–2.4)

## 2023-08-08 MED ORDER — INSULIN ASPART 100 UNIT/ML IJ SOLN
0.0000 [IU] | Freq: Three times a day (TID) | INTRAMUSCULAR | Status: DC
  Administered 2023-08-08: 5 [IU] via SUBCUTANEOUS
  Administered 2023-08-08: 8 [IU] via SUBCUTANEOUS
  Administered 2023-08-09 – 2023-08-12 (×4): 2 [IU] via SUBCUTANEOUS
  Administered 2023-08-13: 3 [IU] via SUBCUTANEOUS

## 2023-08-08 MED ORDER — MAGNESIUM SULFATE 4 GM/100ML IV SOLN
4.0000 g | Freq: Once | INTRAVENOUS | Status: AC
  Administered 2023-08-08: 4 g via INTRAVENOUS
  Filled 2023-08-08: qty 100

## 2023-08-08 MED ORDER — INSULIN ASPART 100 UNIT/ML IJ SOLN
0.0000 [IU] | Freq: Every day | INTRAMUSCULAR | Status: DC
  Administered 2023-08-13 – 2023-08-19 (×5): 2 [IU] via SUBCUTANEOUS

## 2023-08-08 MED ORDER — IOHEXOL 350 MG/ML SOLN
100.0000 mL | Freq: Once | INTRAVENOUS | Status: AC | PRN
  Administered 2023-08-08: 100 mL via INTRAVENOUS

## 2023-08-08 MED ORDER — INSULIN ASPART 100 UNIT/ML IJ SOLN
3.0000 [IU] | Freq: Three times a day (TID) | INTRAMUSCULAR | Status: DC
  Administered 2023-08-08 – 2023-08-12 (×6): 3 [IU] via SUBCUTANEOUS

## 2023-08-08 MED ORDER — IOHEXOL 350 MG/ML SOLN: 100.0000 mL | Freq: Once | INTRAVENOUS | Status: DC | PRN

## 2023-08-08 MED ORDER — MUPIROCIN 2 % EX OINT
1.0000 | TOPICAL_OINTMENT | Freq: Two times a day (BID) | CUTANEOUS | Status: AC
  Administered 2023-08-08 – 2023-08-12 (×10): 1 via NASAL
  Filled 2023-08-08 (×3): qty 22

## 2023-08-08 NOTE — Plan of Care (Signed)

## 2023-08-08 NOTE — Assessment & Plan Note (Signed)
-  Continue home Zoloft and as needed hydroxyzine

## 2023-08-08 NOTE — Assessment & Plan Note (Signed)
Blood pressure within goal. -Continue with Coreg along with Lasix and spironolactone

## 2023-08-08 NOTE — Assessment & Plan Note (Signed)
-

## 2023-08-08 NOTE — Assessment & Plan Note (Signed)
Magnesium of 1.4 -Replete magnesium and monitor

## 2023-08-08 NOTE — Assessment & Plan Note (Signed)
Concern of hepatal thorax, patient had thoracentesis at [facility] twice on 8/14 and 08/04/23 .  Repeat chest x-ray with a large right pleural effusion. IR is on board-patient will need TIPS procedure and likely another thoracentesis. -Continue to monitor

## 2023-08-08 NOTE — Assessment & Plan Note (Signed)
CBG elevated this morning.  A1c of 5.1 -Continue with Semglee at 20 units daily -Moderate SSI -3 unit with meals

## 2023-08-08 NOTE — Progress Notes (Signed)
Patient transferred to [facility] room 23C via [facility] for possible TIPS procedure under Dr. Archer Asa. Report called to [facility] nurse Patrcia Dolly Cone).

## 2023-08-08 NOTE — Assessment & Plan Note (Signed)
Continue PPI ?

## 2023-08-08 NOTE — Hospital Course (Signed)
Taken from H&P.  Ariel Arnold is a pleasant 67 y.o. female with medical history significant for NASH cirrhosis with ascites, insulin-dependent diabetes mellitus, depression, anxiety, and right heel ulcer who presented to [facility] on 08/01/2023 with shortness of breath. Patient had first paracentesis approximately a week prior to this presentation.  [facility] Hospital Course: Upon arrival to the ED, patient was found to have large right pleural effusion and a new supplemental oxygen requirement.  She was admitted to the hospitalist service and underwent therapeutic thoracenteses on 08/02/2023 and again on 08/04/2023.  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  Dr. Archer Asa of IR agreed to see the patient in consultation for consideration of TIPS.  She was transferred to [facility] under hospitalist for IR evaluation.  8/20: Vital stable, labs with slight worsening of creatinine 1.24, hypomagnesemia at 1.4 which is being repleted.  A1c of 5, CBG elevated.  proBNP elevated at 370.

## 2023-08-08 NOTE — Progress Notes (Signed)
Pt trialed without oxygen for 10 min. Pt very short of breath with O2 sats of 85-88% on room air.

## 2023-08-08 NOTE — Assessment & Plan Note (Signed)
Seems stable around baseline. -Monitor renal function -Nephrotoxins

## 2023-08-08 NOTE — Progress Notes (Signed)
Patient arrived to unit, alert and oriented, vss, sat 97% on  3L [place]. Abdomen distented, non tender. Bowel sound sound active, lungs clear, diminished. Patient oriented to room and environment, poc explained to patient. Instructed to call when assistance needed, safety prec initiated.

## 2023-08-08 NOTE — Progress Notes (Signed)
Progress Note   Patient: Ariel Arnold ZOX:[id number] DOB: January 07, 1956 DOA: 08/07/2023     1 DOS: the patient was seen and examined on 08/08/2023   Brief hospital course: Taken from H&P.  HANAN SAJDAK is a pleasant 67 y.o. female with medical history significant for NASH cirrhosis with ascites, insulin-dependent diabetes mellitus, depression, anxiety, and right heel ulcer who presented to [facility] on 08/01/2023 with shortness of breath. Patient had first paracentesis approximately a week prior to this presentation.  [facility] Hospital Course: Upon arrival to the ED, patient was found to have large right pleural effusion and a new supplemental oxygen requirement.  She was admitted to the hospitalist service and underwent therapeutic thoracenteses on 08/02/2023 and again on 08/04/2023.  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  Dr. Archer Asa of IR agreed to see the patient in consultation for consideration of TIPS.  She was transferred to [facility] under hospitalist for IR evaluation.  8/20: Vital stable, labs with slight worsening of creatinine 1.24, hypomagnesemia at 1.4 which is being repleted.  A1c of 5, CBG elevated.  proBNP elevated at 370.       Assessment and Plan: * Decompensated hepatic cirrhosis (HCC) Patient with history of [name] cirrhosis, concern of hepatic hydrothorax, presented from [facility] for TIPS procedure with IR. IR is aware and they will evaluate her today. -Continue with diuretic and lactulose  Acute respiratory failure with hypoxia (HCC) Likely secondary to large pleural effusion with concern of hepatal thorax, No baseline oxygen use, currently on 3 L -Continue supplemental oxygen-wean as tolerated  Recurrent pleural effusion on right Concern of hepatal thorax, patient had thoracentesis at [facility] twice on 8/14 and 08/04/23 .  Repeat chest x-ray with a large right pleural effusion. IR is on board-patient will need TIPS procedure and  likely another thoracentesis. -Continue to monitor  Type 2 diabetes mellitus with diabetic neuropathy, unspecified (HCC) CBG elevated this morning.  A1c of 5.1 -Continue with Semglee at 20 units daily -Moderate SSI -3 unit with meals  Thrombocytopenia (HCC) Secondary to liver cirrhosis.  Seems stable, platelet at 80 -Continue to monitor  Essential hypertension Blood pressure within goal. -Continue with Coreg along with Lasix and spironolactone  CKD stage G3a/A3, GFR 45-59 and albumin creatinine ratio >300 mg/g (HCC) Seems stable around baseline. -Monitor renal function -Nephrotoxins  Major depressive disorder, single episode, unspecified -Continue home Zoloft and as needed hydroxyzine  Hypomagnesemia Magnesium of 1.4 -Replete magnesium and monitor  History of peptic ulcer disease -Continue PPI  Mixed hyperlipidemia -Continue home pravastatin      Subjective: Patient was having some shortness of breath with mildly increased work of breathing.  She wants to take care of this recurrent fluid around her lungs.  Physical Exam: Vitals:   08/08/23 0220 08/08/23 0255 08/08/23 0516 08/08/23 0816  BP: (!) 127/52  (!) 121/47 (!) 115/56  Pulse: 86  81 82  Resp: 19  19 19   Temp: 98.4 F (36.9 C)  98 F (36.7 C) 98.1 F (36.7 C)  TempSrc: Oral     SpO2: 97%  95% 96%  Weight:  82.6 kg    Height:  5\' 8"  (1.727 m)     General.  Frail lady, in no acute distress. Pulmonary.  Decreased breath sound on right mid to lower zone, few left basal crackles, mildly increased work of breathing CV.  Regular rate and rhythm, no JVD, rub or murmur. Abdomen.  Soft, nontender, mildly distended, BS positive. CNS.  Alert  and oriented .  No focal neurologic deficit. Extremities.  Left BKA, right great toe amputated with trace right lower extremity edema. Psychiatry.  Judgment and insight appears normal.   Data Reviewed: Prior data reviewed  Family Communication: No family at bedside,  discussed with patient.  Disposition: Status is: Inpatient Remains inpatient appropriate because: Severity of illness  Planned Discharge Destination: Home  Time spent: 50 minutes  This record has been created using Conservation officer, historic buildings. Errors have been sought and corrected,but may not always be located. Such creation errors do not reflect on the standard of care.   Author: Arnetha Courser, MD 08/08/2023 11:23 AM  For on call review [email or web address].

## 2023-08-08 NOTE — Consult Note (Signed)
Chief Complaint: Patient was seen in consultation today for hepatic hydrothorax; TIPS   Referring Physician(s): Bradley,Candace  Supervising Physician: Roanna Banning  Patient Status: [facility] - In-pt  History of Present Illness: Ariel Arnold is a 67 y.o. female with a medical history significant for anemia, anxiety/depression, obesity, osteomyelitis, PVD, DM2, thrombocytopenia, AVM of the colon and NASH cirrhosis with portal hypertension. Sequelae from portal hypertension includes recurrent GI bleeding, gastrorenal shunt, gastric varices and ascites. She is familiar to IR from an emergent BRTO 05/21/21 for GI bleeding.   She presented to [facility] 08/01/23 with shortness of breath and she was found to have a large right pleural effusion. She underwent therapeutic thoracenteses 8/14 and 8/16. 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 This request was reviewed by Dr. Archer Asa and the patient was transferred to [facility]. A CTA BRTO protocol and a TEE were ordered. Today's labs show: MELD 15, Child Pugh 9/B, FIPS 1.03. Ammonia is 34.  Interventional Radiology has been asked to evaluate this patient for an image-guided transjugular portosystemic shunt creation. Imaging reviewed and procedure approved by Dr. Milford Cage   Past Medical History:  Diagnosis Date   Anemia    Anxiety    Aortic atherosclerosis (HCC)    Arthritis    AVM (arteriovenous malformation) of colon    Depression    Essential hypertension    GI bleeding    Recurrent   Hepatic encephalopathy (HCC)    History of kidney stones    History of RSV infection    Liver cirrhosis secondary to NASH (HCC) 2004   Biopsy-proven   Obesity    Osteomyelitis (HCC)    Peripheral vascular disease (HCC)    Thrombocytopenia (HCC)    Type 2 diabetes mellitus (HCC)     Past Surgical History:  Procedure Laterality Date   ABDOMINAL HYSTERECTOMY     AMPUTATION Left 04/05/2022   Procedure:  Metatarsal amputation revision;  Surgeon: Louann Sjogren, MD;  Location: [facility] ORS;  Service: Podiatry;  Laterality: Left;  Surgical team to do block   AMPUTATION Left 08/26/2022   Procedure: LEFT BELOW KNEE AMPUTATION;  Surgeon: Nadara Mustard, MD;  Location: [facility] OR;  Service: Orthopedics;  Laterality: Left;   APPLICATION OF WOUND VAC Left 08/26/2022   Procedure: APPLICATION OF WOUND VAC;  Surgeon: Nadara Mustard, MD;  Location: [facility] OR;  Service: Orthopedics;  Laterality: Left;   COLONOSCOPY     Cystoscopy with ureteral stent     ESOPHAGOGASTRODUODENOSCOPY     ESOPHAGOGASTRODUODENOSCOPY (EGD) WITH PROPOFOL N/A 05/20/2021   Procedure: ESOPHAGOGASTRODUODENOSCOPY (EGD) WITH PROPOFOL;  Surgeon: Corbin Ade, MD;  Location: [facility];  Service: Endoscopy;  Laterality: N/A;  needs intubation   IR ANGIOGRAM FOLLOW UP STUDY  05/21/2021   IR ANGIOGRAM SELECTIVE EACH ADDITIONAL VESSEL  05/21/2021   IR EMBO VENOUS NOT HEMORR HEMANG  INC GUIDE ROADMAPPING  05/21/2021   IR US GUIDE VASC ACCESS RIGHT  05/21/2021   IR VENOGRAM RENAL UNI LEFT  05/21/2021   IRRIGATION AND DEBRIDEMENT FOOT Left 06/04/2022   Procedure: REVISIONAL TRAMSMETATARSAL AMPUTATION;  Surgeon: Candelaria Stagers, DPM;  Location: [facility] ORS;  Service: Podiatry;  Laterality: Left;   RADIOLOGY WITH ANESTHESIA N/A 05/21/2021   Procedure: IR WITH ANESTHESIA;  Surgeon: Radiologist, Medication, MD;  Location: [facility] OR;  Service: Radiology;  Laterality: N/A;   Toe amputations Bilateral    TUBAL LIGATION     WOUND DEBRIDEMENT Left 11/12/2021  Procedure: DEBRIDEMENT WOUND;FIRST METATARSAL RESECTION; APPLICATION OF SKIN GRAFT SUBSTITUTE;  Surgeon: Park Liter, DPM;  Location: [facility] ORS;  Service: Podiatry;  Laterality: Left;    Allergies: Chlorhexidine and Tape  Medications: Prior to Admission medications   Medication Sig Start Date End Date Taking? Authorizing Provider  ACCU-CHEK GUIDE test strip  08/27/21   [provider]  Accu-Chek Softclix Lancets  lancets SMARTSIG:2 Topical Twice Daily 08/27/21   [provider]  albuterol (VENTOLIN HFA) 108 (90 Base) MCG/ACT inhaler Inhale 2 puffs into the lungs every 4 (four) hours as needed for shortness of breath or wheezing. 01/11/21   [provider]  Blood Glucose Monitoring Suppl (ACCU-CHEK GUIDE) w/Device KIT  08/27/21   [provider]  Calcium Carbonate-Vit D-Min (CALCIUM 1200) 1200-1000 MG-UNIT CHEW Chew 2 tablets by mouth daily.    [provider]  carvedilol (COREG) 6.25 MG tablet Take 1 tablet (6.25 mg total) by mouth 2 (two) times daily. 04/11/22 08/21/23  Jerald Kief, MD  clotrimazole-betamethasone (LOTRISONE) cream Apply 1 Application topically daily as needed (for yeast/rash). 11/25/21   [provider]  diclofenac Sodium (VOLTAREN) 1 % GEL Apply 2 g topically in the morning and at bedtime. 12/02/21   [provider]  ferrous sulfate 325 (65 FE) MG tablet Take 325 mg by mouth in the morning and at bedtime.    [provider]  folic acid (FOLVITE) 1 MG tablet Take 1 mg by mouth 2 (two) times daily. 06/02/21   [provider]  furosemide (LASIX) 20 MG tablet Take 20 mg by mouth once a week. 05/27/21   [provider]  gabapentin (NEURONTIN) 600 MG tablet Take 600 mg by mouth 3 (three) times daily as needed (nerve pain). 07/23/21   [provider]  GLOBAL EASE INJECT PEN NEEDLES 32G X 4 MM MISC  08/27/21   [provider]  hydrOXYzine (ATARAX) 25 MG tablet Take 25 mg by mouth daily. 08/17/22   [provider]  insulin aspart (NOVOLOG) 100 UNIT/ML injection 0-20 Units, Subcutaneous, 3 times daily with meal CBG < 70: Implement Hypoglycemia measures CBG 70 - 120: 0 units CBG 121 - 150: 3 units CBG 151 - 200: 4 units CBG 201 - 250: 7 units CBG 251 - 300: 11 units CBG 301 - 350: 15 units CBG 351 - 400: 20 units CBG > 400: call MD 08/31/22   Maretta Bees, MD  ketorolac (ACULAR) 0.5 % ophthalmic solution  Place 1 drop into both eyes 4 (four) times daily as needed (dry eyes). 09/06/21   [provider]  lactulose (CHRONULAC) 10 GM/15ML solution Take 15 mLs (10 g total) by mouth 2 (two) times daily. for ammonia levels 08/31/22   Ghimire, Werner Lean, MD  oxyCODONE (OXY IR/ROXICODONE) 5 MG immediate release tablet Take 1 tablet (5 mg total) by mouth every 6 (six) hours as needed for moderate pain (pain score 4-6). 09/13/22   Adonis Huguenin, NP  pantoprazole (PROTONIX) 40 MG tablet Take 40 mg by mouth daily as needed (heartburn). 05/27/21   [provider]  pravastatin (PRAVACHOL) 20 MG tablet Take 1 tablet (20 mg total) by mouth at bedtime. Take after completion of daptomycin 01/03/22   [name], Rebekah Chesterfield, MD  rifaximin (XIFAXAN) 550 MG TABS tablet Take 1 tablet (550 mg total) by mouth 2 (two) times daily. 08/31/22   Ghimire, Werner Lean, MD  sertraline (ZOLOFT) 100 MG tablet Take 2 tablets (200 mg total) by mouth daily. Patient  taking differently: Take 150 mg by mouth daily. 06/08/22 08/21/23  Erick Blinks, MD  sodium hypochlorite (DAKIN'S 1/2 STRENGTH) external solution Irrigate with 1 Application as directed daily. 03/08/23   Candelaria Stagers, DPM  spironolactone (ALDACTONE) 50 MG tablet Take 50 mg by mouth daily as needed (swelling). 05/27/21   [provider]  TOUJEO MAX SOLOSTAR 300 UNIT/ML Solostar Pen Inject 20 Units into the skin at bedtime. 08/31/22   Ghimire, Werner Lean, MD  vitamin C (ASCORBIC ACID) 500 MG tablet Take 500 mg by mouth 2 (two) times daily.    [provider]  Vitamin D, Ergocalciferol, (DRISDOL) 1.25 MG (50000 UNIT) CAPS capsule Take 1 capsule by mouth every 7 (seven) days. Mondays 04/05/21   [provider]     Family History  Problem Relation Age of Onset   Heart failure Mother    Hyperlipidemia Mother    Diabetes Mellitus II Mother    Heart failure Father    Hyperlipidemia Father     Social History   Socioeconomic History   Marital status:  Widowed    Spouse name: Not on file   Number of children: Not on file   Years of education: Not on file   Highest education level: Not on file  Occupational History   Not on file  Tobacco Use   Smoking status: Former    Types: Cigarettes   Smokeless tobacco: Never  Vaping Use   Vaping status: Never Used  Substance and Sexual Activity   Alcohol use: Yes    Comment: wine rare   Drug use: Never   Sexual activity: Not on file  Other Topics Concern   Not on file  Social History Narrative   Not on file   Social Determinants of Health   Financial Resource Strain: Medium Risk (08/03/2023)   Received from [facility]   Overall Financial Resource Strain (CARDIA)    Difficulty of Paying Living Expenses: Somewhat hard  Food Insecurity: No Food Insecurity (08/07/2023)   Hunger Vital Sign    Worried About Running Out of Food in the Last Year: Never true    Ran Out of Food in the Last Year: Never true  Recent Concern: Food Insecurity - Food Insecurity Present (08/03/2023)   Received from [facility]   Hunger Vital Sign    Worried About Running Out of Food in the Last Year: Sometimes true    Ran Out of Food in the Last Year: Sometimes true  Transportation Needs: No Transportation Needs (08/07/2023)   PRAPARE - Administrator, Civil Service (Medical): No    Lack of Transportation (Non-Medical): No  Recent Concern: Transportation Needs - Unmet Transportation Needs (08/03/2023)   Received from [facility] - Transportation    Lack of Transportation (Medical): Yes    Lack of Transportation (Non-Medical): Yes  Physical Activity: Inactive (07/16/2020)   [facility] from [facility]   Exercise Vital Sign    Days of Exercise per Week: 0 days    Minutes of Exercise per Session: 0 min  Stress: No Stress Concern Present (06/11/2022)   Received from [facility] - Occupational Stress  Questionnaire    Feeling of Stress : Only a little  Social Connections: Socially Isolated (08/03/2023)   Received from [facility]   Social Connection and Isolation Panel [NHANES]    Frequency of Communication with Friends and  Family: Twice a week    Frequency of Social Gatherings with Friends and Family: Never    Attends Religious Services: 1 to 4 times per year    Active Member of [employer] or Organizations: No    Attends Banker Meetings: Never    Marital Status: Widowed    Review of Systems: A 12 point ROS discussed and pertinent positives are indicated in the HPI above.  All other systems are negative.  Review of Systems  Constitutional:  Positive for appetite change and fatigue.  Respiratory:  Positive for shortness of breath. Negative for cough.   Cardiovascular:  Negative for chest pain and leg swelling.  Gastrointestinal:  Positive for abdominal distention and nausea. Negative for vomiting.  Neurological:  Negative for dizziness and headaches.  Psychiatric/Behavioral:  Negative for agitation and confusion.     Vital Signs: BP (!) 115/56 (BP Location: Right Arm)   Pulse 82   Temp 98.1 F (36.7 C)   Resp 19   Ht 5\' 8"  (1.727 m)   Wt 182 lb 1.6 oz (82.6 kg)   SpO2 96%   BMI 27.69 kg/m   Physical Exam Constitutional:      General: She is not in acute distress. HENT:     Mouth/Throat:     Mouth: Mucous membranes are moist.     Pharynx: Oropharynx is clear.  Cardiovascular:     Rate and Rhythm: Normal rate and regular rhythm.     Pulses: Normal pulses.     Heart sounds: Normal heart sounds.  Pulmonary:     Breath sounds: Decreased air movement present.     Comments: Mildly increased work of breathing. Patient becomes short of breath during conversation. She is on oxygen via [place].  Abdominal:     General: There is distension.  Musculoskeletal:     Right lower leg: No edema.     Left lower leg: No edema.     Comments: Left BKA  Skin:    General:  Skin is warm and dry.  Neurological:     Mental Status: She is alert and oriented to person, place, and time.  Psychiatric:        Mood and Affect: Mood normal.        Behavior: Behavior normal.        Thought Content: Thought content normal.        Judgment: Judgment normal.     Imaging: DG CHEST PORT 1 VIEW  Result Date: 08/07/2023 CLINICAL DATA:  Recurrent right pleural effusion.  [id number]. EXAM: PORTABLE CHEST 1 VIEW COMPARISON:  Portable chest 08/05/2023 FINDINGS: 4:58 p.m. A large right pleural effusion with apical extension and possible partial apical loculation is again noted, slightly increased in size. There is a small portion of the perihilar right upper lung field remaining aerated, less than previously with linear atelectasis and hazy opacity. There is no substantial left pleural effusion. Interstitial and patchy opacities of the left mid and lower lung fields appear similar with perihilar linear atelectatic bands. There is mild cardiomegaly. The aortic arch is heavily calcified. Central vessels appear normal in caliber. IMPRESSION: 1. Large right pleural effusion with apical extension and possible partial apical loculation, slightly increased in size. 2. Small portion of the perihilar right upper lung field remaining aerated, less than previously. 3. Interstitial and patchy opacities of the left mid and lower lung fields appear similar. 4. Mild cardiomegaly. 5. Aortic atherosclerosis. Electronically Signed   By: Almira Bar M.D.   On:  08/07/2023 20:52    Labs:  CBC: Recent Labs    08/30/22 0255 08/31/22 0309 02/02/23 2114 08/08/23 0309  WBC 2.9* 5.8 2.8* 3.1*  HGB 8.4* 8.6* 9.6* 8.7*  HCT 25.2* 25.0* 29.5* 26.6*  PLT 64* 69* 81* 80*    COAGS: Recent Labs    08/08/23 0309  INR 1.5*    BMP: Recent Labs    08/30/22 0255 08/31/22 0309 02/02/23 2114 08/08/23 0309  NA 137 137 134* 134*  K 3.9 3.3* 3.7 3.9  CL 112* 109 102 99  CO2 22 20* 26 29  GLUCOSE 136*  204* 219* 281*  BUN 32* 30* 15 27*  CALCIUM 8.1* 8.5* 8.6* 8.3*  CREATININE 0.82 0.78 0.84 1.29*  GFRNONAA >60 >60 >60 45*    LIVER FUNCTION TESTS: Recent Labs    08/22/22 0402 08/31/22 0309 02/02/23 2114 08/08/23 0309  BILITOT 0.9 1.6* 1.1 1.4*  AST 23 37 35 21  ALT 14 19 24 15   ALKPHOS 96 104 99 76  PROT 5.8* 6.2* 6.2* 6.0*  ALBUMIN 2.0* 2.1* 2.7* 2.2*    TUMOR MARKERS: No results for input(s): "AFPTM", "CEA", "CA199", "CHROMGRNA" in the last 8760 hours.  Assessment and Plan:  Decompensated cirrhosis with recurrent ascites and hepatic hydrothorax: Valente David, 67 year old female, is tentatively scheduled 08/09/23 for an image-guided transjugular intrahepatic portosystemic shunt, paracentesis and right thoracentesis. The procedure was discussed with the patient at the bedside and she is aware the procedure will be done under general anesthesia. We also discussed her DNR status and she is in agreement to rescind her DNR order for the course of the TIPS procedure.   Risks and benefits of TIPS, BRTO and/or additional variceal embolization were discussed with the patient and/or the patient's family including, but not limited to, infection, bleeding, damage to adjacent structures, worsening hepatic and/or cardiac function, worsening and/or the development of altered mental status/encephalopathy, non-target embolization and death.   This interventional procedure involves the use of X-rays and because of the nature of the planned procedure, it is possible that we will have prolonged use of X-ray fluoroscopy.  Potential radiation risks to you include (but are not limited to) the following: - A slightly elevated risk for cancer  several years later in life. This risk is typically less than 0.5% percent. This risk is low in comparison to the normal incidence of human cancer, which is 33% for women and 50% for men according to the American Cancer Society. - Radiation induced injury  can include skin redness, resembling a rash, tissue breakdown / ulcers and hair loss (which can be temporary or permanent).   The likelihood of either of these occurring depends on the difficulty of the procedure and whether you are sensitive to radiation due to previous procedures, disease, or genetic conditions.   IF your procedure requires a prolonged use of radiation, you will be notified and given written instructions for further action.  It is your responsibility to monitor the irradiated area for the 2 weeks following the procedure and to notify your physician if you are concerned that you have suffered a radiation induced injury.    All of the patient's questions were answered, patient is agreeable to proceed. She will be NPO at midnight. CMP and PT/INR ordered for tomorrow morning. She is not on any blood-thinning medications. She is a DNR but will be a FULL CODE during the TIPS procedure.   Consent signed and in chart.  Thank you for this interesting consult.  I greatly enjoyed meeting Ariel Arnold and look forward to participating in their care.  A copy of this report was sent to the requesting provider on this date.  Electronically Signed: Alwyn Ren, AGACNP-BC [id number] 08/08/2023, 11:46 AM   I spent a total of 20 Minutes    in face to face in clinical consultation, greater than 50% of which was counseling/coordinating care for TIPS

## 2023-08-08 NOTE — Progress Notes (Signed)
   08/08/23 1254  TOC Brief Assessment  Insurance and Status Reviewed  Patient has primary care physician Yes  Home environment has been reviewed yes  Prior level of function: independent  Prior/Current Home Services No current home services  Social Determinants of Health Reivew SDOH reviewed no interventions necessary  Readmission risk has been reviewed Yes  Transition of care needs no transition of care needs at this time

## 2023-08-08 NOTE — Assessment & Plan Note (Signed)
Secondary to liver cirrhosis.  Seems stable, platelet at 80 -Continue to monitor

## 2023-08-08 NOTE — Assessment & Plan Note (Signed)
Likely secondary to large pleural effusion with concern of hepatal thorax, No baseline oxygen use, currently on 3 L -Continue supplemental oxygen-wean as tolerated

## 2023-08-08 NOTE — Assessment & Plan Note (Signed)
Patient with history of [name] cirrhosis, concern of hepatic hydrothorax, presented from [facility] for TIPS procedure with IR. IR is aware and they will evaluate her today. -Continue with diuretic and lactulose

## 2023-08-08 NOTE — Progress Notes (Signed)
Echocardiogram 2D Echocardiogram has been performed.  Ariel Arnold 08/08/2023, 4:39 PM

## 2023-08-09 ENCOUNTER — Encounter (HOSPITAL_COMMUNITY)
Admission: AD | Disposition: A | Discharge status: Hospice | Payer: Self-pay | Source: Other Acute Inpatient Hospital | Attending: Internal Medicine

## 2023-08-09 ENCOUNTER — Inpatient Hospital Stay (HOSPITAL_COMMUNITY): Payer: Medicare HMO | Admitting: Certified Registered Nurse Anesthetist

## 2023-08-09 ENCOUNTER — Encounter (HOSPITAL_COMMUNITY): Payer: Self-pay | Admitting: Family Medicine

## 2023-08-09 ENCOUNTER — Inpatient Hospital Stay (HOSPITAL_COMMUNITY): Payer: Medicare HMO

## 2023-08-09 DIAGNOSIS — K7290 Hepatic failure, unspecified without coma: Secondary | ICD-10-CM | POA: Diagnosis not present

## 2023-08-09 DIAGNOSIS — K7460 Unspecified cirrhosis of liver: Secondary | ICD-10-CM | POA: Diagnosis not present

## 2023-08-09 DIAGNOSIS — J9601 Acute respiratory failure with hypoxia: Secondary | ICD-10-CM | POA: Diagnosis not present

## 2023-08-09 DIAGNOSIS — Z87891 Personal history of nicotine dependence: Secondary | ICD-10-CM | POA: Diagnosis not present

## 2023-08-09 DIAGNOSIS — N1831 Chronic kidney disease, stage 3a: Secondary | ICD-10-CM

## 2023-08-09 DIAGNOSIS — J90 Pleural effusion, not elsewhere classified: Secondary | ICD-10-CM | POA: Diagnosis not present

## 2023-08-09 DIAGNOSIS — I129 Hypertensive chronic kidney disease with stage 1 through stage 4 chronic kidney disease, or unspecified chronic kidney disease: Secondary | ICD-10-CM | POA: Diagnosis not present

## 2023-08-09 DIAGNOSIS — E1140 Type 2 diabetes mellitus with diabetic neuropathy, unspecified: Secondary | ICD-10-CM | POA: Diagnosis not present

## 2023-08-09 DIAGNOSIS — I10 Essential (primary) hypertension: Secondary | ICD-10-CM | POA: Diagnosis not present

## 2023-08-09 HISTORY — PX: TIPS PROCEDURE: SHX808

## 2023-08-09 HISTORY — PX: IR THORACENTESIS ASP PLEURAL SPACE W/IMG GUIDE: IMG5380

## 2023-08-09 HISTORY — PX: IR US GUIDE VASC ACCESS RIGHT: IMG2390

## 2023-08-09 HISTORY — PX: IR TIPS: IMG2295

## 2023-08-09 HISTORY — PX: IR PARACENTESIS: IMG2679

## 2023-08-09 LAB — CBC
HCT: 24.2 % — ABNORMAL LOW (ref 36.0–46.0)
Hemoglobin: 7.8 g/dL — ABNORMAL LOW (ref 12.0–15.0)
MCH: 30.8 pg (ref 26.0–34.0)
MCHC: 32.2 g/dL (ref 30.0–36.0)
MCV: 95.7 fL (ref 80.0–100.0)
Platelets: 78 10*3/uL — ABNORMAL LOW (ref 150–400)
RBC: 2.53 MIL/uL — ABNORMAL LOW (ref 3.87–5.11)
RDW: 14.7 % (ref 11.5–15.5)
WBC: 3.1 10*3/uL — ABNORMAL LOW (ref 4.0–10.5)
nRBC: 0 % (ref 0.0–0.2)

## 2023-08-09 LAB — POCT I-STAT 7, (LYTES, BLD GAS, ICA,H+H)
Acid-Base Excess: 4 mmol/L — ABNORMAL HIGH (ref 0.0–2.0)
Acid-Base Excess: 3 mmol/L — ABNORMAL HIGH (ref 0.0–2.0)
Bicarbonate: 29 mmol/L — ABNORMAL HIGH (ref 20.0–28.0)
Bicarbonate: 28.8 mmol/L — ABNORMAL HIGH (ref 20.0–28.0)
Calcium, Ion: 1.14 mmol/L — ABNORMAL LOW (ref 1.15–1.40)
Calcium, Ion: 1.16 mmol/L (ref 1.15–1.40)
HCT: 25 % — ABNORMAL LOW (ref 36.0–46.0)
HCT: 24 % — ABNORMAL LOW (ref 36.0–46.0)
Hemoglobin: 8.5 g/dL — ABNORMAL LOW (ref 12.0–15.0)
Hemoglobin: 8.2 g/dL — ABNORMAL LOW (ref 12.0–15.0)
O2 Saturation: 97 %
O2 Saturation: 92 %
Patient temperature: 35.1
Potassium: 3.8 mmol/L (ref 3.5–5.1)
Potassium: 3.8 mmol/L (ref 3.5–5.1)
Sodium: 138 mmol/L (ref 135–145)
Sodium: 139 mmol/L (ref 135–145)
TCO2: 30 mmol/L (ref 22–32)
TCO2: 30 mmol/L (ref 22–32)
pCO2 arterial: 41.7 mmHg (ref 32–48)
pCO2 arterial: 49.3 mmHg — ABNORMAL HIGH (ref 32–48)
pH, Arterial: 7.443 (ref 7.35–7.45)
pH, Arterial: 7.374 (ref 7.35–7.45)
pO2, Arterial: 78 mmHg — ABNORMAL LOW (ref 83–108)
pO2, Arterial: 66 mmHg — ABNORMAL LOW (ref 83–108)

## 2023-08-09 LAB — GLUCOSE, CAPILLARY
Glucose-Capillary: 147 mg/dL — ABNORMAL HIGH (ref 70–99)
Glucose-Capillary: 150 mg/dL — ABNORMAL HIGH (ref 70–99)
Glucose-Capillary: 146 mg/dL — ABNORMAL HIGH (ref 70–99)
Glucose-Capillary: 151 mg/dL — ABNORMAL HIGH (ref 70–99)
Glucose-Capillary: 138 mg/dL — ABNORMAL HIGH (ref 70–99)
Glucose-Capillary: 144 mg/dL — ABNORMAL HIGH (ref 70–99)

## 2023-08-09 LAB — COMPREHENSIVE METABOLIC PANEL
ALT: 15 U/L (ref 0–44)
AST: 18 U/L (ref 15–41)
Albumin: 2.1 g/dL — ABNORMAL LOW (ref 3.5–5.0)
Alkaline Phosphatase: 75 U/L (ref 38–126)
Anion gap: 8 (ref 5–15)
BUN: 28 mg/dL — ABNORMAL HIGH (ref 8–23)
CO2: 28 mmol/L (ref 22–32)
Calcium: 8.1 mg/dL — ABNORMAL LOW (ref 8.9–10.3)
Chloride: 98 mmol/L (ref 98–111)
Creatinine, Ser: 0.94 mg/dL (ref 0.44–1.00)
GFR, Estimated: >60 mL/min (ref >60)
Glucose, Bld: 212 mg/dL — ABNORMAL HIGH (ref 70–99)
Potassium: 4 mmol/L (ref 3.5–5.1)
Sodium: 134 mmol/L — ABNORMAL LOW (ref 135–145)
Total Bilirubin: 1.1 mg/dL (ref 0.3–1.2)
Total Protein: 5.8 g/dL — ABNORMAL LOW (ref 6.5–8.1)

## 2023-08-09 LAB — PROTIME-INR
INR: 1.5 — ABNORMAL HIGH (ref 0.8–1.2)
Prothrombin Time: 18.4 s — ABNORMAL HIGH (ref 11.4–15.2)

## 2023-08-09 LAB — MRSA NEXT GEN BY PCR, NASAL: MRSA by PCR Next Gen: DETECTED — AB

## 2023-08-09 LAB — PREPARE RBC (CROSSMATCH)

## 2023-08-09 SURGERY — TRANS-JUGULAR INTRAHEPATIC PORTAL SHUNT (TIPS)
Anesthesia: General

## 2023-08-09 MED ORDER — DICLOFENAC SODIUM 1 % EX GEL
4.0000 g | Freq: Four times a day (QID) | CUTANEOUS | Status: DC
  Administered 2023-08-09 – 2023-08-22 (×44): 4 g via TOPICAL
  Filled 2023-08-09 (×2): qty 100

## 2023-08-09 MED ORDER — LIDOCAINE-EPINEPHRINE 1 %-1:100000 IJ SOLN
INTRAMUSCULAR | Status: AC
  Filled 2023-08-09: qty 1

## 2023-08-09 MED ORDER — PHENYLEPHRINE HCL-NACL 20-0.9 MG/250ML-% IV SOLN
INTRAVENOUS | Status: DC | PRN
  Administered 2023-08-09: 40 ug/min via INTRAVENOUS

## 2023-08-09 MED ORDER — ALBUMIN HUMAN 5 % IV SOLN: INTRAVENOUS | Status: DC | PRN

## 2023-08-09 MED ORDER — EPINEPHRINE 1 MG/10ML IJ SOSY
PREFILLED_SYRINGE | INTRAMUSCULAR | Status: AC
  Filled 2023-08-09: qty 10

## 2023-08-09 MED ORDER — ORAL CARE MOUTH RINSE
15.0000 mL | OROMUCOSAL | Status: DC
  Administered 2023-08-09 – 2023-08-22 (×49): 15 mL via OROMUCOSAL

## 2023-08-09 MED ORDER — LACTATED RINGERS IV SOLN: INTRAVENOUS | Status: DC

## 2023-08-09 MED ORDER — NOREPINEPHRINE 4 MG/250ML-% IV SOLN
INTRAVENOUS | Status: AC
  Filled 2023-08-09: qty 250

## 2023-08-09 MED ORDER — ROCURONIUM BROMIDE 10 MG/ML (PF) SYRINGE
PREFILLED_SYRINGE | INTRAVENOUS | Status: DC | PRN
  Administered 2023-08-09: 30 mg via INTRAVENOUS
  Administered 2023-08-09: 70 mg via INTRAVENOUS

## 2023-08-09 MED ORDER — SODIUM CHLORIDE 0.9 % IV SOLN
INTRAVENOUS | Status: AC
  Filled 2023-08-09: qty 20

## 2023-08-09 MED ORDER — LIDOCAINE 2% (20 MG/ML) 5 ML SYRINGE
INTRAMUSCULAR | Status: DC | PRN
  Administered 2023-08-09: 60 mg via INTRAVENOUS

## 2023-08-09 MED ORDER — CHLORHEXIDINE GLUCONATE 0.12 % MT SOLN: 15.0000 mL | Freq: Once | OROMUCOSAL | Status: AC

## 2023-08-09 MED ORDER — LACTATED RINGERS IV SOLN
INTRAVENOUS | Status: DC | PRN
Start: 2023-08-09 — End: 2023-08-09

## 2023-08-09 MED ORDER — CALCIUM CHLORIDE 10 % IV SOLN
INTRAVENOUS | Status: AC
  Filled 2023-08-09: qty 10

## 2023-08-09 MED ORDER — FENTANYL CITRATE (PF) 100 MCG/2ML IJ SOLN
INTRAMUSCULAR | Status: AC
  Filled 2023-08-09: qty 2

## 2023-08-09 MED ORDER — CHLORHEXIDINE GLUCONATE 0.12 % MT SOLN
OROMUCOSAL | Status: AC
  Administered 2023-08-09: 15 mL via OROMUCOSAL
  Filled 2023-08-09: qty 15

## 2023-08-09 MED ORDER — ORAL CARE MOUTH RINSE: 15.0000 mL | OROMUCOSAL | Status: DC | PRN

## 2023-08-09 MED ORDER — ORAL CARE MOUTH RINSE: 15.0000 mL | Freq: Once | OROMUCOSAL | Status: AC

## 2023-08-09 MED ORDER — IOHEXOL 300 MG/ML  SOLN
150.0000 mL | Freq: Once | INTRAMUSCULAR | Status: AC | PRN
  Administered 2023-08-09: 75 mL via INTRAVENOUS

## 2023-08-09 MED ORDER — PHENYLEPHRINE HCL-NACL 20-0.9 MG/250ML-% IV SOLN
25.0000 ug/min | INTRAVENOUS | Status: DC
  Administered 2023-08-09: 50 ug/min via INTRAVENOUS
  Administered 2023-08-10: 90 ug/min via INTRAVENOUS
  Administered 2023-08-10: 80 ug/min via INTRAVENOUS
  Administered 2023-08-10: 100 ug/min via INTRAVENOUS
  Administered 2023-08-10: 40 ug/min via INTRAVENOUS
  Administered 2023-08-10: 90 ug/min via INTRAVENOUS
  Filled 2023-08-09 (×6): qty 250

## 2023-08-09 MED ORDER — PHENYLEPHRINE 80 MCG/ML (10ML) SYRINGE FOR IV PUSH (FOR BLOOD PRESSURE SUPPORT)
PREFILLED_SYRINGE | INTRAVENOUS | Status: DC | PRN
  Administered 2023-08-09: 160 ug via INTRAVENOUS
  Administered 2023-08-09 (×4): 80 ug via INTRAVENOUS
  Administered 2023-08-09: 160 ug via INTRAVENOUS
  Administered 2023-08-09: 80 ug via INTRAVENOUS

## 2023-08-09 MED ORDER — PROPOFOL 10 MG/ML IV BOLUS
INTRAVENOUS | Status: DC | PRN
Start: 2023-08-09 — End: 2023-08-09
  Administered 2023-08-09: 130 mg via INTRAVENOUS

## 2023-08-09 MED ORDER — SUGAMMADEX SODIUM 200 MG/2ML IV SOLN
INTRAVENOUS | Status: DC | PRN
  Administered 2023-08-09: 200 mg via INTRAVENOUS

## 2023-08-09 MED ORDER — SODIUM CHLORIDE 0.9 % IV SOLN
2.0000 g | Freq: Once | INTRAVENOUS | Status: AC
  Administered 2023-08-09: 2 g via INTRAVENOUS
  Filled 2023-08-09: qty 20

## 2023-08-09 MED ORDER — SODIUM CHLORIDE 0.9 % IV SOLN: 250.0000 mL | INTRAVENOUS | Status: DC

## 2023-08-09 MED ORDER — VASOPRESSIN 20 UNIT/ML IV SOLN
INTRAVENOUS | Status: DC | PRN
  Administered 2023-08-09: 1 [IU] via INTRAVENOUS

## 2023-08-09 MED ORDER — CHLORHEXIDINE GLUCONATE CLOTH 2 % EX PADS
6.0000 | MEDICATED_PAD | Freq: Every day | CUTANEOUS | Status: DC
  Administered 2023-08-09 – 2023-08-22 (×15): 6 via TOPICAL

## 2023-08-09 MED ORDER — SODIUM CHLORIDE 0.9 % IV SOLN
1.0000 g | INTRAVENOUS | Status: DC
  Filled 2023-08-09: qty 10

## 2023-08-09 NOTE — Sedation Documentation (Signed)
Measurements:  Pre RA - 10 Post RA - 10 Pre PV - 18 Post PV - 14 Pre Mean 8  Post Mean 4

## 2023-08-09 NOTE — Progress Notes (Signed)
[facility]                                                                               Ariel Arnold, is a 67 y.o. female, DOB - [birth year], MWN:[id number] Admit date - 08/07/2023    Outpatient Primary MD for the patient is Ariel Arnold  LOS - 2  days    Brief summary   Ariel Arnold is a pleasant 67 y.o. female with medical history significant for NASH cirrhosis with ascites, insulin-dependent diabetes mellitus, depression, anxiety, and right heel ulcer who presented to [facility] on 08/01/2023 with shortness of breath. Patient had first paracentesis approximately a week prior to this presentation.  [facility] Course: Upon arrival to the ED, patient was found to have large right pleural effusion and a new supplemental oxygen requirement.  She was admitted to the hospitalist service and underwent therapeutic thoracenteses on 08/02/2023 and again on 08/04/2023.  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  Dr. Archer Arnold of IR agreed to see the patient in consultation for consideration of TIPS.  She was transferred to [facility] under hospitalist for IR evaluation.   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 IR aware and she is scheduled for TIPS later today.   Acute respiratory failure with hypoxia (HCC) Likely secondary to large pleural effusion with concern of hepato thorax, No baseline oxygen use, currently on 4 L -Continue supplemental oxygen-wean as tolerated.   Recurrent pleural effusion on right Concern of hepatal thorax, patient had thoracentesis at [facility] twice on 8/14 and 08/04/23 .  Repeat chest x-ray with a large right pleural effusion. IR is on board-patient will need TIPS procedure and likely another thoracentesis.   Type 2 diabetes mellitus with  diabetic neuropathy, unspecified (HCC) CBG (last 3)  Recent Labs    08/09/23 1153 08/09/23 1445 08/09/23 1749  GLUCAP 150* 146* 151*    A1c is 5.1% Continue with SSI and Semglee.  Thrombocytopenia (HCC) Sec to cirrhosis.  Monitor.   Essential hypertension Optimal BP parameters.  -Continue with Coreg along with Lasix and spironolactone  CKD stage G3a/A3, GFR 45-59 and albumin creatinine ratio >300 mg/g (HCC) Seems stable around baseline. -Monitor renal function -Nephrotoxins  Major depressive disorder, single episode, unspecified -resume zoloft and hydroxyzine.   Hypomagnesemia Replaced.   History of peptic ulcer disease -Continue PPI  Mixed hyperlipidemia -Continue home pravastatin     RN Pressure Injury Documentation: Pressure Injury 08/07/23 Sacrum Right;Left;Mid Stage 2 -  Partial thickness loss of dermis presenting as a shallow open injury with a red, pink wound bed without slough. (Active)  08/07/23 1507  Location: Sacrum  Location Orientation: Right;Left;Mid  Staging: Stage 2 -  Partial thickness loss of dermis presenting as a shallow open injury with a red, pink wound bed without slough.  Wound Description (Comments):   Present on Admission: Yes  Dressing Type Foam - Lift dressing to assess site every shift 08/09/23 1133     Pressure Injury 08/07/23 Ankle Right;Medial Stage 2 -  Partial thickness loss of dermis presenting as a shallow open injury with a red, pink wound bed without slough. (Active)  08/07/23 1507  Location: Ankle  Location Orientation: Right;Medial  Staging: Stage 2 -  Partial thickness loss of dermis presenting as a shallow open injury with a red, pink wound bed without slough.  Wound Description (Comments):   Present on Admission: Yes  Dressing Type Foam - Lift dressing to assess site every shift 08/09/23 1133       Estimated body mass index is 27.82 kg/m as calculated from the following:   Height as of this encounter: 5\' 8"   (1.727 m).   Weight as of this encounter: 83 kg.  Code Status: DNR DVT Prophylaxis:  SCDs Start: 08/07/23 1524   Level of Care: Level of care: Med-Surg Family Communication: none at bedside.   Disposition Plan:     Remains inpatient appropriate:  TIPS later today.   Procedures:  TIPS BY IR on 08/09/23  Consultants:   IR  Antimicrobials:   Anti-infectives (From admission, onward)    Start     Dose/Rate Route Frequency Ordered Stop   08/09/23 1515  cefTRIAXone (ROCEPHIN) 2 g in sodium chloride 0.9 % 100 mL IVPB        2 g 200 mL/hr over 30 Minutes Intravenous  Once 08/09/23 1507 08/09/23 1536   08/09/23 1330  cefTRIAXone (ROCEPHIN) 1 g in sodium chloride 0.9 % 100 mL IVPB  Status:  Discontinued        1 g 200 mL/hr over 30 Minutes Intravenous To Radiology 08/09/23 1316 08/09/23 1506        Medications  Scheduled Meds:  [MAR Hold] carvedilol  6.25 mg Oral BID   [MAR Hold] diclofenac Sodium  4 g Topical QID   [MAR Hold] folic acid  1 mg Oral BID   [MAR Hold] furosemide  20 mg Oral Daily   [MAR Hold] insulin aspart  0-15 Units Subcutaneous TID WC   [MAR Hold] insulin aspart  0-5 Units Subcutaneous QHS   [MAR Hold] insulin aspart  3 Units Subcutaneous TID WC   [MAR Hold] insulin glargine-yfgn  20 Units Subcutaneous QHS   [MAR Hold] lactulose  10 g Oral BID   [MAR Hold] mupirocin ointment  1 Application Nasal BID   [MAR Hold] pantoprazole  40 mg Oral Daily   [MAR Hold] pravastatin  20 mg Oral QHS   [MAR Hold] sertraline  150 mg Oral Daily   [MAR Hold] spironolactone  50 mg Oral Daily   Continuous Infusions:  lactated ringers Stopped (08/09/23 1720)   PRN Meds:.[MAR Hold] acetaminophen **OR** [MAR Hold] acetaminophen, [MAR Hold] albuterol, [MAR Hold] fentaNYL (SUBLIMAZE) injection, [MAR Hold] gabapentin, [MAR Hold] hydrOXYzine, [MAR Hold] iohexol, [MAR Hold] ondansetron **OR** [MAR Hold] ondansetron (ZOFRAN) IV, [MAR Hold] oxyCODONE    Subjective:   Ariel Arnold  was seen and examined today.  No new complaints.   Objective:   Vitals:   08/09/23 0713 08/09/23 1231 08/09/23 1745 08/09/23 1800  BP: (!) 103/55 (!) 108/53 (!) 99/44 (!) 85/39  Pulse: 76 70 68 72  Resp: 16 16 20 20   Temp: 98.2 F (36.8 C) 98 F (36.7 C) (!) 97.5 F (36.4 C)   TempSrc: Oral Oral    SpO2: 97% 92% (!) 85% 96%  Weight:  83 kg    Height:  5\' 8"  (1.727 m)      Intake/Output Summary (Last 24 hours) at 08/09/2023 1801 Last data filed at 08/09/2023 1610 Gross  per 24 hour  Intake 1992 ml  Output 6055 ml  Net -4063 ml   Filed Weights   08/08/23 0255 08/09/23 1231  Weight: 82.6 kg 83 kg     Exam General exam: elderly woman, ill appearing on 4 lit of [place] oxygen.  Respiratory system: diminished air entry at bases. More on the right than left.  Cardiovascular system: S1 & S2 heard, RRR.  Gastrointestinal system: Abdomen is soft, non tender.  Central nervous system: Alert and oriented to person nd place.  Extremities: Symmetric 5 x 5 power. Skin: No rashes,  Psychiatry: flat affect.   Data Reviewed:  I have personally reviewed following labs and imaging studies   CBC Lab Results  Component Value Date   WBC 3.1 (L) 08/09/2023   RBC 2.53 (L) 08/09/2023   HGB 7.8 (L) 08/09/2023   HCT 24.2 (L) 08/09/2023   MCV 95.7 08/09/2023   MCH 30.8 08/09/2023   PLT 78 (L) 08/09/2023   MCHC 32.2 08/09/2023   RDW 14.7 08/09/2023   LYMPHSABS 1.1 02/02/2023   MONOABS 0.2 02/02/2023   EOSABS 0.1 02/02/2023   BASOSABS 0.0 02/02/2023     Last metabolic panel Lab Results  Component Value Date   NA 134 (L) 08/09/2023   K 4.0 08/09/2023   CL 98 08/09/2023   CO2 28 08/09/2023   BUN 28 (H) 08/09/2023   CREATININE 0.94 08/09/2023   GLUCOSE 212 (H) 08/09/2023   GFRNONAA >60 08/09/2023   CALCIUM 8.1 (L) 08/09/2023   PHOS 2.6 08/22/2022   PROT 5.8 (L) 08/09/2023   ALBUMIN 2.1 (L) 08/09/2023   BILITOT 1.1 08/09/2023   ALKPHOS 75 08/09/2023   AST 18 08/09/2023   ALT 15  08/09/2023   ANIONGAP 8 08/09/2023    CBG (last 3)  Recent Labs    08/09/23 1153 08/09/23 1445 08/09/23 1749  GLUCAP 150* 146* 151*      Coagulation Profile: Recent Labs  Lab 08/08/23 0309 08/09/23 0111  INR 1.5* 1.5*     Radiology Studies: CT ANGIO ABD/PELVIS BRTO  Result Date: 08/09/2023 CLINICAL DATA:  Portal hypertension EXAM: CTA ABDOMEN AND PELVIS WITHOUT AND WITH CONTRAST TECHNIQUE: Multidetector CT imaging of the abdomen and pelvis was performed using the standard protocol during bolus administration of intravenous contrast. Multiplanar reconstructed images and MIPs were obtained and reviewed to evaluate the vascular anatomy. RADIATION DOSE REDUCTION: This exam was performed according to the departmental dose-optimization program which includes automated exposure control, adjustment of the mA and/or kV according to patient size and/or use of iterative reconstruction technique. CONTRAST:  OMNIPAQUE IOHEXOL 350 MG/ML SOLN COMPARISON:  CT AP 05/22/2021 and 05/20/2021.  IR BRTO 05/21/2021. FINDINGS: VASCULAR Arterial: Mild burden of aortoiliac atherosclerosis. Widely patent celiac axis, SMA, IMA, renal and pelvic arteries. No aneurysmal dilatation, dissection, vasculitis or significant stenosis. Venous: Prior coil embolization of gastric varices, with dense coil pack at the LEFT quadrant. RIGHT paraesophageal varices. Widely patent IVC, hepatic veins, portal, SMV and splenic veins. Review of the MIP images confirms the above findings. NON-VASCULAR Lower chest: Mitral valve calcifications. At least moderate volume RIGHT pleural effusion. LEFT lung is comparatively clear, with trace linear basilar opacities. Hepatobiliary: Shrunken liver with coarse nodular contour. No discrete focal hepatic lesion. Hepatomegaly, with the RIGHT lobe measuring 19 cm. Cholecystectomy. No biliary dilatation. Pancreas: No pancreatic ductal dilatation or surrounding inflammatory changes. Spleen:  Splenomegaly, measuring up to 19.5 cm Adrenals/Urinary Tract: Adrenal glands are unremarkable. 1.4 cm RIGHT midpolar simple renal cyst measuring  19 HU. Kidneys are otherwise normal, without renal calculi or hydronephrosis. Bladder is decompressed. Stomach/Bowel: Stomach is within normal limits. Appendix appears normal. Nonobstructed small bowel. Nondilated colon. Mild generalized bowel thickening. No evidence of bowel distention, or inflammatory change. Lymphatic: No enlarged abdominal or pelvic lymph nodes. Reproductive: Hysterectomy. No adnexal mass. Other: No abdominal wall hernia. Moderate volume of abdominopelvic ascites. Musculoskeletal: Diffuse moderate body wall edema. Multilevel degenerative of the spine, including vacuum disc phenomenon at the lower lumbar spine, and chronic-appearing L3 superior endplate compression deformity. No acute osseous findings. IMPRESSION: VASCULAR 1. Patent IVC, hepatic and portal veins. 2. Coil embolization of prior gastric varices without evidence of residual. RIGHT paraesophageal varices. 3.  Aortic Atherosclerosis (ICD10-I70.0). NON-VASCULAR 1. Cirrhosis with hepatosplenomegaly, and stigmata of portal venous hypertension. 2. Moderate-to-large volume RIGHT pleural effusion and abdominopelvic ascites. 3. Mild generalized bowel thickening, favored consistent with portal enteropathy. 4. 1.4 cm RIGHT Bosniak I benign renal cyst. No follow-up imaging is recommended. JACR 2018 Feb; 264-273, Management of the Incidental Renal Mass on CT, [employer] 2021; 814-848, Bosniak Classification of Cystic Renal Masses, Version 2019 5.  Additional incidental, chronic and senescent findings as above. Roanna Banning, MD [facility] Radiology Electronically Signed   By: Roanna Banning M.D.   On: 08/09/2023 07:44   ECHOCARDIOGRAM COMPLETE  Result Date: 08/08/2023    ECHOCARDIOGRAM REPORT   Patient Name:   Ariel Arnold Date of Exam: 08/08/2023  Medical Rec #:  [id number]       Height:       68.0 in Accession #:    [id number]      Weight:       182.1 lb Date of Birth:  Mar 08, 1956        BSA:          1.964 m Patient Age:    67 years        BP:           115/56 mmHg Patient Gender: F               HR:           76 bpm. Exam Location:  Inpatient Procedure: 2D Echo, Cardiac Doppler and Color Doppler Indications:    Preoperative evaluation  History:        Patient has prior history of Echocardiogram examinations, most                 recent 06/07/2022. CAD; Risk Factors:Hypertension, Diabetes and                 Dyslipidemia. CKD, stage 2.  Sonographer:    Lucendia Herrlich Referring Phys: [id number] JON MUGWERU IMPRESSIONS  1. Left ventricular ejection fraction, by estimation, is 60 to 65%. The left ventricle has normal function. The left ventricle has no regional wall motion abnormalities. There is mild left ventricular hypertrophy of the basal-septal segment. Left ventricular diastolic parameters are indeterminate.  2. Right ventricular systolic function is normal. The right ventricular size is normal. There is normal pulmonary artery systolic pressure. The estimated right ventricular systolic pressure is 26.0 mmHg.  3. The mitral valve is degenerative. No evidence of mitral valve regurgitation. Mild mitral stenosis. The mean mitral valve gradient is 3.5 mmHg. Moderate mitral annular calcification.  4. The aortic valve is tricuspid. Aortic valve regurgitation is not visualized. Aortic valve sclerosis is present, with no evidence of aortic valve stenosis. Aortic valve Vmax measures 1.38 m/s.  5. The inferior vena cava  is normal in size with greater than 50% respiratory variability, suggesting right atrial pressure of 3 mmHg. FINDINGS  Left Ventricle: Left ventricular ejection fraction, by estimation, is 60 to 65%. The left ventricle has normal function. The left ventricle has no regional wall motion abnormalities. The left ventricular internal cavity size was normal  in size. There is  mild left ventricular hypertrophy of the basal-septal segment. Left ventricular diastolic parameters are indeterminate. Normal left ventricular filling pressure. Right Ventricle: The right ventricular size is normal. No increase in right ventricular wall thickness. Right ventricular systolic function is normal. There is normal pulmonary artery systolic pressure. The tricuspid regurgitant velocity is 2.40 m/s, and  with an assumed right atrial pressure of 3 mmHg, the estimated right ventricular systolic pressure is 26.0 mmHg. Left Atrium: Left atrial size was normal in size. Right Atrium: Right atrial size was normal in size. Pericardium: There is no evidence of pericardial effusion. Mitral Valve: The mitral valve is degenerative in appearance. Moderate mitral annular calcification. No evidence of mitral valve regurgitation. Mild mitral valve stenosis. MV peak gradient, 6.7 mmHg. The mean mitral valve gradient is 3.5 mmHg. Tricuspid Valve: The tricuspid valve is normal in structure. Tricuspid valve regurgitation is mild . No evidence of tricuspid stenosis. Aortic Valve: The aortic valve is tricuspid. Aortic valve regurgitation is not visualized. Aortic valve sclerosis is present, with no evidence of aortic valve stenosis. Aortic valve peak gradient measures 7.6 mmHg. Pulmonic Valve: The pulmonic valve was normal in structure. Pulmonic valve regurgitation is trivial. No evidence of pulmonic stenosis. Aorta: The aortic root is normal in size and structure. Venous: The inferior vena cava is normal in size with greater than 50% respiratory variability, suggesting right atrial pressure of 3 mmHg. IAS/Shunts: No atrial level shunt detected by color flow Doppler.  LEFT VENTRICLE PLAX 2D LVIDd:         4.40 cm   Diastology LVIDs:         2.80 cm   LV e' medial:    6.10 cm/s LV PW:         1.00 cm   LV E/e' medial:  14.9 LV IVS:        1.20 cm   LV e' lateral:   11.60 cm/s LVOT diam:     2.10 cm   LV E/e'  lateral: 7.8 LV SV:         53 LV SV Index:   27 LVOT Area:     3.46 cm  RIGHT VENTRICLE             IVC RV S prime:     14.10 cm/s  IVC diam: 1.60 cm TAPSE (M-mode): 2.4 cm LEFT ATRIUM             Index        RIGHT ATRIUM          Index LA diam:        4.30 cm 2.19 cm/m   RA Area:     9.13 cm LA Vol (A2C):   43.3 ml 22.05 ml/m  RA Volume:   15.90 ml 8.10 ml/m LA Vol (A4C):   40.0 ml 20.37 ml/m LA Biplane Vol: 41.4 ml 21.08 ml/m  AORTIC VALVE AV Area (Vmax): 2.04 cm AV Vmax:        138.00 cm/s AV Peak Grad:   7.6 mmHg LVOT Vmax:      81.47 cm/s LVOT Vmean:     52.067 cm/s LVOT VTI:  0.154 m  AORTA Ao Root diam: 3.30 cm Ao Asc diam:  3.30 cm MITRAL VALVE                TRICUSPID VALVE MV Area (PHT): 2.99 cm     TR Peak grad:   23.0 mmHg MV Area VTI:   1.33 cm     TR Vmax:        240.00 cm/s MV Peak grad:  6.7 mmHg MV Mean grad:  3.5 mmHg     SHUNTS MV Vmax:       1.29 m/s     Systemic VTI:  0.15 m MV Vmean:      85.6 cm/s    Systemic Diam: 2.10 cm MV Decel Time: 254 msec MR Peak grad: 15.5 mmHg MR Vmax:      197.00 cm/s MV E velocity: 90.80 cm/s MV A velocity: 109.00 cm/s MV E/A ratio:  0.83 Armanda Magic MD Electronically signed by Armanda Magic MD Signature Date/Time: 08/08/2023/4:51:49 PM    Final        Kathlen Mody M.D. [facility]  Available via Epic secure chat 7am-7pm After 7 pm, please refer to night coverage provider listed on amion.  Addendum   Post TIPS procedure , patient in PACU, on NRB,.  Transferred her to progressive care Unit as per anesthesia.  BIPAP, and ABG later today and every 6 hours .     Kathlen Mody, MD

## 2023-08-09 NOTE — Progress Notes (Signed)
RT called to PACU to place pt on BiPAP. RT at bedside to find pt on NRB 15L. Pt in no distress and SVS. RT placed pt on BiPAP (servo-I). Pt tolerating well at this time.

## 2023-08-09 NOTE — H&P (Signed)
Referring Physician(s): Dorris Singh  Supervising Physician: Roanna Banning  Patient Status:  [facility] - In-pt  Chief Complaint:  NASH cirrhosis with portal hypertension. Sequelae from portal hypertension includes recurrent GI bleeding, gastrorenal shunt, gastric varices and ascites.  Subjective:  She presented to [facility] 08/01/23 with shortness of breath and she was found to have a large right pleural effusion. She underwent therapeutic thoracenteses 8/14 and 8/16. 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 This request was reviewed by Dr. Archer Asa and the patient was transferred to [facility]. A CTA BRTO protocol performed  Scheduled now for Transjugular Intrahepatic portosystemic shunt procedure in IR  Allergies: Chlorhexidine and Tape  Medications: Prior to Admission medications   Medication Sig Start Date End Date Taking? Authorizing Provider  ACCU-CHEK GUIDE test strip  08/27/21   [provider]  Accu-Chek Softclix Lancets lancets SMARTSIG:2 Topical Twice Daily 08/27/21   [provider]  albuterol (VENTOLIN HFA) 108 (90 Base) MCG/ACT inhaler Inhale 2 puffs into the lungs every 4 (four) hours as needed for shortness of breath or wheezing. 01/11/21   [provider]  Blood Glucose Monitoring Suppl (ACCU-CHEK GUIDE) w/Device KIT  08/27/21   [provider]  Calcium Carbonate-Vit D-Min (CALCIUM 1200) 1200-1000 MG-UNIT CHEW Chew 2 tablets by mouth daily.    [provider]  carvedilol (COREG) 6.25 MG tablet Take 1 tablet (6.25 mg total) by mouth 2 (two) times daily. 04/11/22 08/21/23  Jerald Kief, MD  clotrimazole-betamethasone (LOTRISONE) cream Apply 1 Application topically daily as needed (for yeast/rash). 11/25/21   [provider]  diclofenac Sodium (VOLTAREN) 1 % GEL Apply 2 g topically in the morning and at bedtime. 12/02/21   [provider]  ferrous sulfate 325 (65  FE) MG tablet Take 325 mg by mouth in the morning and at bedtime.    [provider]  folic acid (FOLVITE) 1 MG tablet Take 1 mg by mouth 2 (two) times daily. 06/02/21   [provider]  furosemide (LASIX) 20 MG tablet Take 20 mg by mouth once a week. 05/27/21   [provider]  gabapentin (NEURONTIN) 600 MG tablet Take 600 mg by mouth 3 (three) times daily as needed (nerve pain). 07/23/21   [provider]  GLOBAL EASE INJECT PEN NEEDLES 32G X 4 MM MISC  08/27/21   [provider]  hydrOXYzine (ATARAX) 25 MG tablet Take 25 mg by mouth daily. 08/17/22   [provider]  insulin aspart (NOVOLOG) 100 UNIT/ML injection 0-20 Units, Subcutaneous, 3 times daily with meal CBG < 70: Implement Hypoglycemia measures CBG 70 - 120: 0 units CBG 121 - 150: 3 units CBG 151 - 200: 4 units CBG 201 - 250: 7 units CBG 251 - 300: 11 units CBG 301 - 350: 15 units CBG 351 - 400: 20 units CBG > 400: call MD 08/31/22   Maretta Bees, MD  ketorolac (ACULAR) 0.5 % ophthalmic solution Place 1 drop into both eyes 4 (four) times daily as needed (dry eyes). 09/06/21   [provider]  lactulose (CHRONULAC) 10 GM/15ML solution Take 15 mLs (10 g total) by mouth 2 (two) times daily. for ammonia levels 08/31/22   Ghimire, Werner Lean, MD  oxyCODONE (OXY IR/ROXICODONE) 5 MG immediate release tablet Take 1 tablet (5 mg total) by mouth every 6 (six) hours as needed for moderate pain (pain score 4-6). 09/13/22   Adonis Huguenin, NP  pantoprazole (PROTONIX) 40  MG tablet Take 40 mg by mouth daily as needed (heartburn). 05/27/21   [provider]  pravastatin (PRAVACHOL) 20 MG tablet Take 1 tablet (20 mg total) by mouth at bedtime. Take after completion of daptomycin 01/03/22   [name], Rebekah Chesterfield, MD  rifaximin (XIFAXAN) 550 MG TABS tablet Take 1 tablet (550 mg total) by mouth 2 (two) times daily. 08/31/22   Ghimire, Werner Lean, MD  sertraline (ZOLOFT) 100 MG tablet Take 2 tablets (200 mg  total) by mouth daily. Patient taking differently: Take 150 mg by mouth daily. 06/08/22 08/21/23  Erick Blinks, MD  sodium hypochlorite (DAKIN'S 1/2 STRENGTH) external solution Irrigate with 1 Application as directed daily. 03/08/23   Candelaria Stagers, DPM  spironolactone (ALDACTONE) 50 MG tablet Take 50 mg by mouth daily as needed (swelling). 05/27/21   [provider]  TOUJEO MAX SOLOSTAR 300 UNIT/ML Solostar Pen Inject 20 Units into the skin at bedtime. 08/31/22   Ghimire, Werner Lean, MD  vitamin C (ASCORBIC ACID) 500 MG tablet Take 500 mg by mouth 2 (two) times daily.    [provider]  Vitamin D, Ergocalciferol, (DRISDOL) 1.25 MG (50000 UNIT) CAPS capsule Take 1 capsule by mouth every 7 (seven) days. Mondays 04/05/21   [provider]     Vital Signs: BP (!) 103/55 (BP Location: Left Arm)   Pulse 76   Temp 98.2 F (36.8 C) (Oral)   Resp 16   Ht 5\' 8"  (1.727 m)   Wt 182 lb 1.6 oz (82.6 kg)   SpO2 97%   BMI 27.69 kg/m   Physical Exam Vitals reviewed.  HENT:     Mouth/Throat:     Mouth: Mucous membranes are moist.  Cardiovascular:     Rate and Rhythm: Normal rate and regular rhythm.     Heart sounds: Normal heart sounds.  Pulmonary:     Effort: Pulmonary effort is normal.     Comments: Rt lung-- diminished breath sounds Musculoskeletal:        General: Normal range of motion.     Comments: LBKA  Skin:    General: Skin is warm.  Neurological:     Mental Status: She is alert and oriented to person, place, and time.  Psychiatric:        Behavior: Behavior normal.     Imaging: CT ANGIO ABD/PELVIS BRTO  Result Date: 08/09/2023 CLINICAL DATA:  Portal hypertension EXAM: CTA ABDOMEN AND PELVIS WITHOUT AND WITH CONTRAST TECHNIQUE: Multidetector CT imaging of the abdomen and pelvis was performed using the standard protocol during bolus administration of intravenous contrast. Multiplanar reconstructed images and MIPs were obtained and reviewed to evaluate  the vascular anatomy. RADIATION DOSE REDUCTION: This exam was performed according to the departmental dose-optimization program which includes automated exposure control, adjustment of the mA and/or kV according to patient size and/or use of iterative reconstruction technique. CONTRAST:  OMNIPAQUE IOHEXOL 350 MG/ML SOLN COMPARISON:  CT AP 05/22/2021 and 05/20/2021.  IR BRTO 05/21/2021. FINDINGS: VASCULAR Arterial: Mild burden of aortoiliac atherosclerosis. Widely patent celiac axis, SMA, IMA, renal and pelvic arteries. No aneurysmal dilatation, dissection, vasculitis or significant stenosis. Venous: Prior coil embolization of gastric varices, with dense coil pack at the LEFT quadrant. RIGHT paraesophageal varices. Widely patent IVC, hepatic veins, portal, SMV and splenic veins. Review of the MIP images confirms the above findings. NON-VASCULAR Lower chest: Mitral valve calcifications. At least moderate volume RIGHT pleural effusion. LEFT lung is comparatively clear, with trace linear basilar opacities. Hepatobiliary: Shrunken liver  with coarse nodular contour. No discrete focal hepatic lesion. Hepatomegaly, with the RIGHT lobe measuring 19 cm. Cholecystectomy. No biliary dilatation. Pancreas: No pancreatic ductal dilatation or surrounding inflammatory changes. Spleen: Splenomegaly, measuring up to 19.5 cm Adrenals/Urinary Tract: Adrenal glands are unremarkable. 1.4 cm RIGHT midpolar simple renal cyst measuring 19 HU. Kidneys are otherwise normal, without renal calculi or hydronephrosis. Bladder is decompressed. Stomach/Bowel: Stomach is within normal limits. Appendix appears normal. Nonobstructed small bowel. Nondilated colon. Mild generalized bowel thickening. No evidence of bowel distention, or inflammatory change. Lymphatic: No enlarged abdominal or pelvic lymph nodes. Reproductive: Hysterectomy. No adnexal mass. Other: No abdominal wall hernia. Moderate volume of abdominopelvic ascites. Musculoskeletal:  Diffuse moderate body wall edema. Multilevel degenerative of the spine, including vacuum disc phenomenon at the lower lumbar spine, and chronic-appearing L3 superior endplate compression deformity. No acute osseous findings. IMPRESSION: VASCULAR 1. Patent IVC, hepatic and portal veins. 2. Coil embolization of prior gastric varices without evidence of residual. RIGHT paraesophageal varices. 3.  Aortic Atherosclerosis (ICD10-I70.0). NON-VASCULAR 1. Cirrhosis with hepatosplenomegaly, and stigmata of portal venous hypertension. 2. Moderate-to-large volume RIGHT pleural effusion and abdominopelvic ascites. 3. Mild generalized bowel thickening, favored consistent with portal enteropathy. 4. 1.4 cm RIGHT Bosniak I benign renal cyst. No follow-up imaging is recommended. JACR 2018 Feb; 264-273, Management of the Incidental Renal Mass on CT, [employer] 2021; 814-848, Bosniak Classification of Cystic Renal Masses, Version 2019 5.  Additional incidental, chronic and senescent findings as above. Roanna Banning, MD [facility] Electronically Signed   By: Roanna Banning M.D.   On: 08/09/2023 07:44   ECHOCARDIOGRAM COMPLETE  Result Date: 08/08/2023    ECHOCARDIOGRAM REPORT   Patient Name:   Ariel Arnold Date of Exam: 08/08/2023 Medical Rec #:  [id number]       Height:       68.0 in Accession #:    [id number]      Weight:       182.1 lb Date of Birth:  1956-04-02        BSA:          1.964 m Patient Age:    67 years        BP:           115/56 mmHg Patient Gender: F               HR:           76 bpm. Exam Location:  Inpatient Procedure: 2D Echo, Cardiac Doppler and Color Doppler Indications:    Preoperative evaluation  History:        Patient has prior history of Echocardiogram examinations, most                 recent 06/07/2022. CAD; Risk Factors:Hypertension, Diabetes and                 Dyslipidemia. CKD, stage 2.  Sonographer:    Lucendia Herrlich Referring Phys:  [id number] JON MUGWERU IMPRESSIONS  1. Left ventricular ejection fraction, by estimation, is 60 to 65%. The left ventricle has normal function. The left ventricle has no regional wall motion abnormalities. There is mild left ventricular hypertrophy of the basal-septal segment. Left ventricular diastolic parameters are indeterminate.  2. Right ventricular systolic function is normal. The right ventricular size is normal. There is normal pulmonary artery systolic pressure. The estimated right ventricular systolic pressure is 26.0 mmHg.  3. The mitral valve is degenerative. No evidence of mitral valve regurgitation.  Mild mitral stenosis. The mean mitral valve gradient is 3.5 mmHg. Moderate mitral annular calcification.  4. The aortic valve is tricuspid. Aortic valve regurgitation is not visualized. Aortic valve sclerosis is present, with no evidence of aortic valve stenosis. Aortic valve Vmax measures 1.38 m/s.  5. The inferior vena cava is normal in size with greater than 50% respiratory variability, suggesting right atrial pressure of 3 mmHg. FINDINGS  Left Ventricle: Left ventricular ejection fraction, by estimation, is 60 to 65%. The left ventricle has normal function. The left ventricle has no regional wall motion abnormalities. The left ventricular internal cavity size was normal in size. There is  mild left ventricular hypertrophy of the basal-septal segment. Left ventricular diastolic parameters are indeterminate. Normal left ventricular filling pressure. Right Ventricle: The right ventricular size is normal. No increase in right ventricular wall thickness. Right ventricular systolic function is normal. There is normal pulmonary artery systolic pressure. The tricuspid regurgitant velocity is 2.40 m/s, and  with an assumed right atrial pressure of 3 mmHg, the estimated right ventricular systolic pressure is 26.0 mmHg. Left Atrium: Left atrial size was normal in size. Right Atrium: Right atrial size was normal in  size. Pericardium: There is no evidence of pericardial effusion. Mitral Valve: The mitral valve is degenerative in appearance. Moderate mitral annular calcification. No evidence of mitral valve regurgitation. Mild mitral valve stenosis. MV peak gradient, 6.7 mmHg. The mean mitral valve gradient is 3.5 mmHg. Tricuspid Valve: The tricuspid valve is normal in structure. Tricuspid valve regurgitation is mild . No evidence of tricuspid stenosis. Aortic Valve: The aortic valve is tricuspid. Aortic valve regurgitation is not visualized. Aortic valve sclerosis is present, with no evidence of aortic valve stenosis. Aortic valve peak gradient measures 7.6 mmHg. Pulmonic Valve: The pulmonic valve was normal in structure. Pulmonic valve regurgitation is trivial. No evidence of pulmonic stenosis. Aorta: The aortic root is normal in size and structure. Venous: The inferior vena cava is normal in size with greater than 50% respiratory variability, suggesting right atrial pressure of 3 mmHg. IAS/Shunts: No atrial level shunt detected by color flow Doppler.  LEFT VENTRICLE PLAX 2D LVIDd:         4.40 cm   Diastology LVIDs:         2.80 cm   LV e' medial:    6.10 cm/s LV PW:         1.00 cm   LV E/e' medial:  14.9 LV IVS:        1.20 cm   LV e' lateral:   11.60 cm/s LVOT diam:     2.10 cm   LV E/e' lateral: 7.8 LV SV:         53 LV SV Index:   27 LVOT Area:     3.46 cm  RIGHT VENTRICLE             IVC RV S prime:     14.10 cm/s  IVC diam: 1.60 cm TAPSE (M-mode): 2.4 cm LEFT ATRIUM             Index        RIGHT ATRIUM          Index LA diam:        4.30 cm 2.19 cm/m   RA Area:     9.13 cm LA Vol (A2C):   43.3 ml 22.05 ml/m  RA Volume:   15.90 ml 8.10 ml/m LA Vol (A4C):   40.0 ml 20.37 ml/m LA Biplane Vol:  41.4 ml 21.08 ml/m  AORTIC VALVE AV Area (Vmax): 2.04 cm AV Vmax:        138.00 cm/s AV Peak Grad:   7.6 mmHg LVOT Vmax:      81.47 cm/s LVOT Vmean:     52.067 cm/s LVOT VTI:       0.154 m  AORTA Ao Root diam: 3.30 cm Ao Asc  diam:  3.30 cm MITRAL VALVE                TRICUSPID VALVE MV Area (PHT): 2.99 cm     TR Peak grad:   23.0 mmHg MV Area VTI:   1.33 cm     TR Vmax:        240.00 cm/s MV Peak grad:  6.7 mmHg MV Mean grad:  3.5 mmHg     SHUNTS MV Vmax:       1.29 m/s     Systemic VTI:  0.15 m MV Vmean:      85.6 cm/s    Systemic Diam: 2.10 cm MV Decel Time: 254 msec MR Peak grad: 15.5 mmHg MR Vmax:      197.00 cm/s MV E velocity: 90.80 cm/s MV A velocity: 109.00 cm/s MV E/A ratio:  0.83 Armanda Magic MD Electronically signed by Armanda Magic MD Signature Date/Time: 08/08/2023/4:51:49 PM    Final    DG CHEST PORT 1 VIEW  Result Date: 08/07/2023 CLINICAL DATA:  Recurrent right pleural effusion.  [id number]. EXAM: PORTABLE CHEST 1 VIEW COMPARISON:  Portable chest 08/05/2023 FINDINGS: 4:58 p.m. A large right pleural effusion with apical extension and possible partial apical loculation is again noted, slightly increased in size. There is a small portion of the perihilar right upper lung field remaining aerated, less than previously with linear atelectasis and hazy opacity. There is no substantial left pleural effusion. Interstitial and patchy opacities of the left mid and lower lung fields appear similar with perihilar linear atelectatic bands. There is mild cardiomegaly. The aortic arch is heavily calcified. Central vessels appear normal in caliber. IMPRESSION: 1. Large right pleural effusion with apical extension and possible partial apical loculation, slightly increased in size. 2. Small portion of the perihilar right upper lung field remaining aerated, less than previously. 3. Interstitial and patchy opacities of the left mid and lower lung fields appear similar. 4. Mild cardiomegaly. 5. Aortic atherosclerosis. Electronically Signed   By: Almira Bar M.D.   On: 08/07/2023 20:52    Labs:  CBC: Recent Labs    08/31/22 0309 02/02/23 2114 08/08/23 0309 08/09/23 0111  WBC 5.8 2.8* 3.1* 3.1*  HGB 8.6* 9.6* 8.7* 7.8*  HCT  25.0* 29.5* 26.6* 24.2*  PLT 69* 81* 80* 78*    COAGS: Recent Labs    08/08/23 0309 08/09/23 0111  INR 1.5* 1.5*    BMP: Recent Labs    08/31/22 0309 02/02/23 2114 08/08/23 0309 08/09/23 0111  NA 137 134* 134* 134*  K 3.3* 3.7 3.9 4.0  CL 109 102 99 98  CO2 20* 26 29 28   GLUCOSE 204* 219* 281* 212*  BUN 30* 15 27* 28*  CALCIUM 8.5* 8.6* 8.3* 8.1*  CREATININE 0.78 0.84 1.29* 0.94  GFRNONAA >60 >60 45* >60    LIVER FUNCTION TESTS: Recent Labs    08/31/22 0309 02/02/23 2114 08/08/23 0309 08/09/23 0111  BILITOT 1.6* 1.1 1.4* 1.1  AST 37 35 21 18  ALT 19 24 15 15   ALKPHOS 104 99 76 75  PROT 6.2* 6.2* 6.0* 5.8*  ALBUMIN 2.1* 2.7* 2.2* 2.1*    Assessment and Plan:  Scheduled for transjugular Intrahepatic portosystem shunt procedure Risks and benefits of TIPS, BRTO and/or additional variceal embolization were discussed with the patient and/or the patient's family including, but not limited to, infection, bleeding, damage to adjacent structures, worsening hepatic and/or cardiac function, worsening and/or the development of altered mental status/encephalopathy, non-target embolization and death.   This interventional procedure involves the use of X-rays and because of the nature of the planned procedure, it is possible that we will have prolonged use of X-ray fluoroscopy.  Potential radiation risks to you include (but are not limited to) the following: - A slightly elevated risk for cancer  several years later in life. This risk is typically less than 0.5% percent. This risk is low in comparison to the normal incidence of human cancer, which is 33% for women and 50% for men according to the American Cancer Society. - Radiation induced injury can include skin redness, resembling a rash, tissue breakdown / ulcers and hair loss (which can be temporary or permanent).   The likelihood of either of these occurring depends on the difficulty of the procedure and whether you  are sensitive to radiation due to previous procedures, disease, or genetic conditions.   IF your procedure requires a prolonged use of radiation, you will be notified and given written instructions for further action.  It is your responsibility to monitor the irradiated area for the 2 weeks following the procedure and to notify your physician if you are concerned that you have suffered a radiation induced injury.    All of the patient's questions were answered, patient is agreeable to proceed.  Consent signed and in chart.  Electronically Signed: Robet Leu, PA-C 08/09/2023, 9:47 AM   I spent a total of 15 Minutes at the the patient's bedside AND on the patient's hospital floor or unit, greater than 50% of which was counseling/coordinating care for TIPS

## 2023-08-09 NOTE — Anesthesia Preprocedure Evaluation (Signed)
Anesthesia Evaluation  Patient identified by MRN, date of birth, ID band Patient awake    Reviewed: Allergy & Precautions, NPO status , Patient's Chart, lab work & pertinent test results  Airway Mallampati: III  TM Distance: >3 FB Neck ROM: Full    Dental  (+) Edentulous Upper, Edentulous Lower, Dental Advisory Given   Pulmonary shortness of breath, neg COPD, neg recent URI, former smoker Sob improved with pleurocentesis   breath sounds clear to auscultation       Cardiovascular hypertension, Pt. on medications and Pt. on home beta blockers + CAD and + Peripheral Vascular Disease   Rhythm:Regular  1. Left ventricular ejection fraction, by estimation, is 60 to 65%. The  left ventricle has normal function. The left ventricle has no regional  wall motion abnormalities. There is mild left ventricular hypertrophy of  the basal-septal segment. Left  ventricular diastolic parameters are indeterminate.   2. Right ventricular systolic function is normal. The right ventricular  size is normal. There is normal pulmonary artery systolic pressure. The  estimated right ventricular systolic pressure is 26.0 mmHg.   3. The mitral valve is degenerative. No evidence of mitral valve  regurgitation. Mild mitral stenosis. The mean mitral valve gradient is 3.5  mmHg. Moderate mitral annular calcification.   4. The aortic valve is tricuspid. Aortic valve regurgitation is not  visualized. Aortic valve sclerosis is present, with no evidence of aortic  valve stenosis. Aortic valve Vmax measures 1.38 m/s.   5. The inferior vena cava is normal in size with greater than 50%  respiratory variability, suggesting right atrial pressure of 3 mmHg.     Neuro/Psych  PSYCHIATRIC DISORDERS Anxiety Depression    negative neurological ROS     GI/Hepatic ,GERD  Medicated,,(+) Cirrhosis       , Hepatitis -Lab Results      Component                Value                Date                      ALT                      15                  08/09/2023                AST                      18                  08/09/2023                ALKPHOS                  75                  08/09/2023                BILITOT                  1.1                 08/09/2023              Endo/Other  diabetes  Lab Results      Component  Value               Date                      HGBA1C                   5.0                 08/08/2023             Renal/GU CRFRenal diseaseLab Results      Component                Value               Date                      NA                       134 (L)             08/09/2023                K                        4.0                 08/09/2023                CO2                      28                  08/09/2023                GLUCOSE                  212 (H)             08/09/2023                BUN                      28 (H)              08/09/2023                CREATININE               0.94                08/09/2023                CALCIUM                  8.1 (L)             08/09/2023                GFRNONAA                 >60                 08/09/2023                Musculoskeletal  (+) Arthritis ,    Abdominal   Peds  Hematology  (+) Blood dyscrasia, anemia Lab Results      Component  Value               Date                      WBC                      3.1 (L)             08/09/2023                HGB                      7.8 (L)             08/09/2023                HCT                      24.2 (L)            08/09/2023                MCV                      95.7                08/09/2023                PLT                      78 (L)              08/09/2023            Lab Results      Component                Value               Date                      INR                      1.5 (H)             08/09/2023                INR                      1.5 (H)              08/08/2023                INR                      1.6 (H)             05/20/2021              Anesthesia Other Findings   Reproductive/Obstetrics                              Anesthesia Physical Anesthesia Plan  ASA: 4  Anesthesia Plan: General   Post-op Pain Management: Minimal or no pain anticipated   Induction: Intravenous  PONV Risk Score and Plan: 3 and Ondansetron and Dexamethasone  Airway Management Planned: Oral ETT  Additional Equipment: Arterial line  Intra-op Plan:   Post-operative Plan: Possible  Post-op intubation/ventilation  Informed Consent: I have reviewed the patients History and Physical, chart, labs and discussed the procedure including the risks, benefits and alternatives for the proposed anesthesia with the patient or authorized representative who has indicated his/her understanding and acceptance.   Patient has DNR.  Discussed DNR with patient and Suspend DNR.   Dental advisory given  Plan Discussed with: CRNA  Anesthesia Plan Comments:          Anesthesia Quick Evaluation

## 2023-08-09 NOTE — Anesthesia Procedure Notes (Signed)
Procedure Name: Intubation Date/Time: 08/09/2023 3:26 PM  Performed by: Alwyn Ren, CRNAPre-anesthesia Checklist: Timeout performed, Emergency Drugs available, Patient identified, Patient being monitored and Suction available Patient Re-evaluated:Patient Re-evaluated prior to induction Oxygen Delivery Method: Circle system utilized Preoxygenation: Pre-oxygenation with 100% oxygen Induction Type: IV induction Ventilation: Mask ventilation without difficulty Laryngoscope Size: Mac and 3 Grade View: Grade I Tube type: Oral Tube size: 7.5 mm Number of attempts: 1 Airway Equipment and Method: Stylet Placement Confirmation: ETT inserted through vocal cords under direct vision, breath sounds checked- equal and bilateral and CO2 detector Secured at: 21 cm Tube secured with: Tape Dental Injury: Teeth and Oropharynx as per pre-operative assessment

## 2023-08-09 NOTE — Anesthesia Procedure Notes (Signed)
Arterial Line Insertion Start/End8/21/2024 1:50 PM, 08/09/2023 2:00 PM Performed by: Val Eagle, MD  Patient location: Pre-op. Preanesthetic checklist: patient identified Lidocaine 1% used for infiltration Left, radial was placed Catheter size: 20 G Hand hygiene performed  and maximum sterile barriers used   Attempts: 1 Procedure performed without using ultrasound guided technique. Following insertion, dressing applied and Biopatch. Post procedure assessment: unchanged  Patient tolerated the procedure well with no immediate complications.

## 2023-08-09 NOTE — Progress Notes (Signed)
PT Cancellation Note  Patient Details Name: Ariel Arnold MRN: [id number] DOB: 10-28-1956   Cancelled Treatment:    Reason Eval/Treat Not Completed: Patient at procedure or test/unavailable To IR for TIPS;   Will follow up later today as time allows;  Otherwise, will follow up for PT tomorrow;   Thank you,  Van Clines, PT  Acute Rehabilitation Services Office [phone]    Levi Aland 08/09/2023, 12:59 PM

## 2023-08-09 NOTE — Consult Note (Signed)
NAME:  Ariel Arnold, MRN:  [id number], DOB:  1956/07/20, LOS: 2 ADMISSION DATE:  08/07/2023, CONSULTATION DATE:  08/09/2023 REFERRING MD:  Dr. Blake Divine - [facility] CHIEF COMPLAINT: Hypoxia and hypotension  History of Present Illness:  Ariel Arnold is a 67 year old female with an extensive past medical history significant for but not limited to NASH cirrhosis with ascites, type 2 diabetes, HTN, PVD, and osteomyelitis depression, and anxiety who presented to the ED at [facility] 8/13 with complaints of shortness of breath.  On ED arrival patient was seen to have large right pleural effusion with new supplemental oxygen requirement. She was admitted per hospitalist service at underwent thoracentesis 8/14 and again 8/16 at [facility] with concerns for hepatic hydrothorax.   Patient was transferred to [facility] 8/19 for IR consultation for TIPS procedure.  Patient underwent TIPS with interventional radiology 8/21, postprocedure patient was seen in the hypotensive requiring pressor support and hypoxic requiring application of BiPAP  Pertinent  Medical History  NASH cirrhosis with ascites, type 2 diabetes, HTN, PVD, and osteomyelitis depression, and anxiety   Significant Hospital Events: Including procedures, antibiotic start and stop dates in addition to other pertinent events   8/13 presented to [facility] with complaints of shortness of breath 8/14 underwent thoracentesis [facility] 8/16 underwent repeat thoracentesis at [facility] 8/19 transferred to [facility] for interventional radiology evaluation of TIPS procedure 8/21 underwent TIPS with interventional radiology postprocedure patient was seen hypotensive and hypoxic resulting in initiation of pressors and PCCM consult  Interim History / Subjective:  Currently complaining of very mild chest soreness with no aggravating or alleviating factors.  Breathing better with BiPAP No lightheadedness/dizziness.   Objective   Blood  pressure (!) 85/39, pulse 72, temperature (!) 97.5 F (36.4 C), resp. rate 20, height 5\' 8"  (1.727 m), weight 83 kg, SpO2 92%.    Vent Mode: PSV;BIPAP FiO2 (%):  [60 %] 60 % PEEP:  [5 cmH20] 5 cmH20 Pressure Support:  [10 cmH20] 10 cmH20    Intake/Output Summary (Last 24 hours) at 08/09/2023 1842 Last data filed at 08/09/2023 1720 Gross per 24 hour  Intake 1992 ml  Output 6055 ml  Net -4063 ml   Filed Weights   08/08/23 0255 08/09/23 1231  Weight: 82.6 kg 83 kg    Examination: General: Elderly appearing female in NAD HENT: [place]/AT, PERRL, no JVD Lungs: Diminished on the right. Scattered coarse crackles.  Cardiovascular: RRR, no MRG Abdomen: Soft, NT, ND Extremities: R great toe amputation, L BKA Neuro: Alert, oriented, non-focal   Resolved Hospital Problem list     Assessment & Plan:  Hepatic cirrhosis with history of  -Patient with known history of NASH cirrhosis -Underwent TIPS procedure 8/21 with development of hypotension and respiratory distress post procedure  P: Continue rifaximin Continue PPI Neo drip for MAP goal greater than 65 Trend Ammonia levels Family inquiring regarding liver transplant. They suspect she will not be a candidate, but want to at least look into it. Will need to involve GI regardless.   Acute hypoxic respiratory failure Recurrent pleural effusion on the right with concern for hepatic hydrothorax -Now status post thoracentesis x 2, TIPS 8/21.  P: Continue BiPAP for SpO2 goal greater than 90 > should be OK to trial off once she gets to the IC.  Head of bed elevated 30 degrees. Follow intermittent chest x-ray and ABG.   Ensure adequate pulmonary hygiene  Aspiration precautions Resume lasix, spironolactone once BP can tolerate Consider repeat  thoracentesis given recurrent effusion on imaging  Type 2 diabetes P: SSI CBG goal 140-180 CBG checks ACHS  Chest pain - EKG - Troponin  Hypertension HLD Resume coreg when BP will  tolerate Continue statin  Thrombocytopenia in the setting of cirrhosis P: Monitor for signs of bleeding Transfuse for platelets less than 10 Trend CBC  Hyperlipidemia P: Continue statin  Best Practice (right click and "Reselect all SmartList Selections" daily)   Diet/type: NPO DVT prophylaxis: not indicated thrombocytopenia GI prophylaxis: PPI Lines: N/A Foley:  N/A Code Status:  DNR Last date of multidisciplinary goals of care discussion [ Family updated in waiting room 8/21]  Labs   CBC: Recent Labs  Lab 08/08/23 0309 08/09/23 0111 08/09/23 1708 08/09/23 1801  WBC 3.1* 3.1*  --   --   HGB 8.7* 7.8* 8.5* 8.2*  HCT 26.6* 24.2* 25.0* 24.0*  MCV 96.7 95.7  --   --   PLT 80* 78*  --   --     Basic Metabolic Panel: Recent Labs  Lab 08/08/23 0309 08/09/23 0111 08/09/23 1708 08/09/23 1801  NA 134* 134* 138 139  K 3.9 4.0 3.8 3.8  CL 99 98  --   --   CO2 29 28  --   --   GLUCOSE 281* 212*  --   --   BUN 27* 28*  --   --   CREATININE 1.29* 0.94  --   --   CALCIUM 8.3* 8.1*  --   --   MG 1.4*  --   --   --    GFR: Estimated Creatinine Clearance: 63.8 mL/min (by C-G formula based on SCr of 0.94 mg/dL). Recent Labs  Lab 08/08/23 0309 08/09/23 0111  WBC 3.1* 3.1*    Liver Function Tests: Recent Labs  Lab 08/08/23 0309 08/09/23 0111  AST 21 18  ALT 15 15  ALKPHOS 76 75  BILITOT 1.4* 1.1  PROT 6.0* 5.8*  ALBUMIN 2.2* 2.1*   No results for input(s): "LIPASE", "AMYLASE" in the last 168 hours. Recent Labs  Lab 08/08/23 0909  AMMONIA 34    ABG    Component Value Date/Time   PHART 7.374 08/09/2023 1801   PCO2ART 49.3 (H) 08/09/2023 1801   PO2ART 66 (L) 08/09/2023 1801   HCO3 28.8 (H) 08/09/2023 1801   TCO2 30 08/09/2023 1801   ACIDBASEDEF 4.0 (H) 05/21/2021 1429   O2SAT 92 08/09/2023 1801     Coagulation Profile: Recent Labs  Lab 08/08/23 0309 08/09/23 0111  INR 1.5* 1.5*    Cardiac Enzymes: No results for input(s): "CKTOTAL",  "CKMB", "CKMBINDEX", "TROPONINI" in the last 168 hours.  HbA1C: Hgb A1c MFr Bld  Date/Time Value Ref Range Status  08/08/2023 03:09 AM 5.0 4.8 - 5.6 % Final    Comment:    (NOTE) Pre diabetes:          5.7%-6.4%  Diabetes:              >6.4%  Glycemic control for   <7.0% adults with diabetes   04/03/2022 05:33 AM 11.2 (H) 4.8 - 5.6 % Final    Comment:    (NOTE) Pre diabetes:          5.7%-6.4%  Diabetes:              >6.4%  Glycemic control for   <7.0% adults with diabetes     CBG: Recent Labs  Lab 08/08/23 2036 08/09/23 0807 08/09/23 1153 08/09/23 1445 08/09/23 1749  GLUCAP  246* 147* 150* 146* 151*    Review of Systems:   Review of Systems:   Bolds are positive  Constitutional: weight loss, gain, night sweats, Fevers, chills, fatigue .  HEENT: headaches, Sore throat, sneezing, nasal congestion, post nasal drip, Difficulty swallowing, Tooth/dental problems, visual complaints visual changes, ear ache CV:  chest pain, radiates:,Orthopnea, PND, swelling in lower extremities, dizziness, palpitations, syncope.  GI  heartburn, indigestion, abdominal pain, nausea, vomiting, diarrhea, change in bowel habits, loss of appetite, bloody stools.  Resp: cough, productive: , hemoptysis, dyspnea, chest pain, pleuritic.  Skin: rash or itching or icterus GU: dysuria, change in color of urine, urgency or frequency. flank pain, hematuria  MS: joint pain or swelling. decreased range of motion  Psych: change in mood or affect. depression or anxiety.  Neuro: difficulty with speech, weakness, numbness, ataxia    Past Medical History:  She,  has a past medical history of Anemia, Anxiety, Aortic atherosclerosis (HCC), Arthritis, AVM (arteriovenous malformation) of colon, Depression, Essential hypertension, GI bleeding, Hepatic encephalopathy (HCC), History of kidney stones, History of RSV infection, Liver cirrhosis secondary to NASH (HCC) (2004), Obesity, Osteomyelitis (HCC), Peripheral  vascular disease (HCC), Thrombocytopenia (HCC), and Type 2 diabetes mellitus (HCC).   Surgical History:   Past Surgical History:  Procedure Laterality Date   ABDOMINAL HYSTERECTOMY     AMPUTATION Left 04/05/2022   Procedure: Metatarsal amputation revision;  Surgeon: Louann Sjogren, MD;  Location: [facility] ORS;  Service: Podiatry;  Laterality: Left;  Surgical team to do block   AMPUTATION Left 08/26/2022   Procedure: LEFT BELOW KNEE AMPUTATION;  Surgeon: Nadara Mustard, MD;  Location: [facility] OR;  Service: Orthopedics;  Laterality: Left;   APPLICATION OF WOUND VAC Left 08/26/2022   Procedure: APPLICATION OF WOUND VAC;  Surgeon: Nadara Mustard, MD;  Location: [facility] OR;  Service: Orthopedics;  Laterality: Left;   COLONOSCOPY     Cystoscopy with ureteral stent     ESOPHAGOGASTRODUODENOSCOPY     ESOPHAGOGASTRODUODENOSCOPY (EGD) WITH PROPOFOL N/A 05/20/2021   Procedure: ESOPHAGOGASTRODUODENOSCOPY (EGD) WITH PROPOFOL;  Surgeon: Corbin Ade, MD;  Location: [facility];  Service: Endoscopy;  Laterality: N/A;  needs intubation   IR ANGIOGRAM FOLLOW UP STUDY  05/21/2021   IR ANGIOGRAM SELECTIVE EACH ADDITIONAL VESSEL  05/21/2021   IR EMBO VENOUS NOT HEMORR HEMANG  INC GUIDE ROADMAPPING  05/21/2021   IR US GUIDE VASC ACCESS RIGHT  05/21/2021   IR VENOGRAM RENAL UNI LEFT  05/21/2021   IRRIGATION AND DEBRIDEMENT FOOT Left 06/04/2022   Procedure: REVISIONAL TRAMSMETATARSAL AMPUTATION;  Surgeon: Candelaria Stagers, DPM;  Location: [facility] ORS;  Service: Podiatry;  Laterality: Left;   RADIOLOGY WITH ANESTHESIA N/A 05/21/2021   Procedure: IR WITH ANESTHESIA;  Surgeon: Radiologist, Medication, MD;  Location: [facility] OR;  Service: Radiology;  Laterality: N/A;   Toe amputations Bilateral    TUBAL LIGATION     WOUND DEBRIDEMENT Left 11/12/2021   Procedure: DEBRIDEMENT WOUND;FIRST METATARSAL RESECTION; APPLICATION OF SKIN GRAFT SUBSTITUTE;  Surgeon: Park Liter, DPM;  Location: [facility] ORS;  Service: Podiatry;  Laterality: Left;     Social  History:   reports that she has quit smoking. Her smoking use included cigarettes. She has never used smokeless tobacco. She reports current alcohol use. She reports that she does not use drugs.   Family History:  Her family history includes Diabetes Mellitus II in her mother; Heart failure in her father and mother; Hyperlipidemia in her father and mother.   Allergies Allergies  Allergen  Reactions   Tape Rash     Home Medications  Prior to Admission medications   Medication Sig Start Date End Date Taking? Authorizing Provider  ACCU-CHEK GUIDE test strip  08/27/21   [provider]  Accu-Chek Softclix Lancets lancets SMARTSIG:2 Topical Twice Daily 08/27/21   [provider]  albuterol (VENTOLIN HFA) 108 (90 Base) MCG/ACT inhaler Inhale 2 puffs into the lungs every 4 (four) hours as needed for shortness of breath or wheezing. 01/11/21   [provider]  Blood Glucose Monitoring Suppl (ACCU-CHEK GUIDE) w/Device KIT  08/27/21   [provider]  Calcium Carbonate-Vit D-Min (CALCIUM 1200) 1200-1000 MG-UNIT CHEW Chew 2 tablets by mouth daily.    [provider]  carvedilol (COREG) 6.25 MG tablet Take 1 tablet (6.25 mg total) by mouth 2 (two) times daily. 04/11/22 08/21/23  Jerald Kief, MD  clotrimazole-betamethasone (LOTRISONE) cream Apply 1 Application topically daily as needed (for yeast/rash). 11/25/21   [provider]  diclofenac Sodium (VOLTAREN) 1 % GEL Apply 2 g topically in the morning and at bedtime. 12/02/21   [provider]  ferrous sulfate 325 (65 FE) MG tablet Take 325 mg by mouth in the morning and at bedtime.    [provider]  folic acid (FOLVITE) 1 MG tablet Take 1 mg by mouth 2 (two) times daily. 06/02/21   [provider]  furosemide (LASIX) 20 MG tablet Take 20 mg by mouth once a week. 05/27/21   [provider]  gabapentin (NEURONTIN) 600 MG tablet Take 600 mg by mouth 3 (three) times daily as  needed (nerve pain). 07/23/21   [provider]  GLOBAL EASE INJECT PEN NEEDLES 32G X 4 MM MISC  08/27/21   [provider]  hydrOXYzine (ATARAX) 25 MG tablet Take 25 mg by mouth daily. 08/17/22   [provider]  insulin aspart (NOVOLOG) 100 UNIT/ML injection 0-20 Units, Subcutaneous, 3 times daily with meal CBG < 70: Implement Hypoglycemia measures CBG 70 - 120: 0 units CBG 121 - 150: 3 units CBG 151 - 200: 4 units CBG 201 - 250: 7 units CBG 251 - 300: 11 units CBG 301 - 350: 15 units CBG 351 - 400: 20 units CBG > 400: call MD 08/31/22   Maretta Bees, MD  ketorolac (ACULAR) 0.5 % ophthalmic solution Place 1 drop into both eyes 4 (four) times daily as needed (dry eyes). 09/06/21   [provider]  lactulose (CHRONULAC) 10 GM/15ML solution Take 15 mLs (10 g total) by mouth 2 (two) times daily. for ammonia levels 08/31/22   Ghimire, Werner Lean, MD  oxyCODONE (OXY IR/ROXICODONE) 5 MG immediate release tablet Take 1 tablet (5 mg total) by mouth every 6 (six) hours as needed for moderate pain (pain score 4-6). 09/13/22   Adonis Huguenin, NP  pantoprazole (PROTONIX) 40 MG tablet Take 40 mg by mouth daily as needed (heartburn). 05/27/21   [provider]  pravastatin (PRAVACHOL) 20 MG tablet Take 1 tablet (20 mg total) by mouth at bedtime. Take after completion of daptomycin 01/03/22   [name], Rebekah Chesterfield, MD  rifaximin (XIFAXAN) 550 MG TABS tablet Take 1 tablet (550 mg total) by mouth 2 (two) times daily. 08/31/22   Ghimire, Werner Lean, MD  sertraline (ZOLOFT) 100 MG tablet Take 2 tablets (200 mg total) by mouth daily. Patient taking differently: Take 150 mg by mouth daily. 06/08/22 08/21/23  Erick Blinks, MD  sodium hypochlorite (DAKIN'S 1/2 STRENGTH) external solution Irrigate with  1 Application as directed daily. 03/08/23   Candelaria Stagers, DPM  spironolactone (ALDACTONE) 50 MG tablet Take 50 mg by mouth daily as needed (swelling). 05/27/21   [provider]  TOUJEO  MAX SOLOSTAR 300 UNIT/ML Solostar Pen Inject 20 Units into the skin at bedtime. 08/31/22   Ghimire, Werner Lean, MD  vitamin C (ASCORBIC ACID) 500 MG tablet Take 500 mg by mouth 2 (two) times daily.    [provider]  Vitamin D, Ergocalciferol, (DRISDOL) 1.25 MG (50000 UNIT) CAPS capsule Take 1 capsule by mouth every 7 (seven) days. Mondays 04/05/21   [provider]     Critical care time: 48 min     Joneen Roach, AGACNP-BC [facility]  See Amion for personal pager PCCM on call pager [phone] until 7pm. Please call Elink 7p-7a. [phone]  08/09/2023 8:28 PM

## 2023-08-09 NOTE — Procedures (Signed)
Vascular and Interventional Radiology Procedure Note  Patient: Ariel Arnold DOB: [birth year] Medical Record Number: [id number] Note Date/Time: 08/09/23 3:19 PM   Performing Physician: Roanna Banning, MD Assistant(s): Mauri Reading Mir, MD  Diagnosis: cirrhosis with pHTN, refractory ascites and RIGHT hepatic hydrothorax   Procedure(s):  TRANSJUGULAR INTRAHEPATIC PORTOSYSTEMIC SHUNT (TIPS)  INTRACARDIAC ECHOCARDIOGRAPHY (ICE) THERAPEUTIC RIGHT THORACENTESIS and PARACENTESIS  Anesthesia: General Anesthesia Complications: None Estimated Blood Loss: Minimal Specimens: None  Findings:  - access via the RIGHT jugular and femoral veins. - Successful ICE-guided TIPS with placement of 8 cm Viatorr stent - Reduction of portal HTN with portosystemic gradient from 8 to 4 - Esophageal varices noted, decompressed s/p TIPS. No embolization was performed. - Therapeutic RIGHT thoracentesis with 2.0 L of pleural fluid removed. - Therapeutic paracentesis with 3.9 L of serous ascites removed.   Plan: - Bedrest x2 hrs and RLE straight. No HOB restrictions. - OK to advance diet as tolerated and resume Home medications. - Lactulose TID and monitor for hepatic encephalopathy. - NH3 in AM.  Final report to follow once all images are reviewed and compared with previous studies.  See detailed dictation with images in PACS. The patient tolerated the procedure well without incident or complication and was returned to PACU in stable condition.    Roanna Banning, MD [facility]   Pager. [phone] Clinic. [phone]

## 2023-08-09 NOTE — Progress Notes (Signed)
eLink Physician-Brief Progress Note Patient Name: TIM SEVY DOB: [birth year] MRN: [id number]   Date of Service  08/09/2023  HPI/Events of Note  Patient admitted to the ICU for hypotension and respiratory failure s/p TIPS procedure.  eICU Interventions  New Patient Evaluation.        Migdalia Dk 08/09/2023, 9:27 PM

## 2023-08-09 NOTE — Transfer of Care (Signed)
Immediate Anesthesia Transfer of Care Note  Patient: Ariel Arnold  Procedure(s) Performed: TRANS-JUGULAR INTRAHEPATIC PORTAL SHUNT (TIPS)  Patient Location: PACU  Anesthesia Type:General  Level of Consciousness: awake, oriented, drowsy, and patient cooperative  Airway & Oxygen Therapy: non-rebreather face mask  Post-op Assessment: Report given to RN and Post -op Vital signs reviewed and stable  Post vital signs: Reviewed and stable  Last Vitals:  Vitals Value Taken Time  BP 94/41 08/09/23 1752  Temp 36.4 C 08/09/23 1745  Pulse 69 08/09/23 1755  Resp 20 08/09/23 1755  SpO2 94 % 08/09/23 1755  Vitals shown include unfiled device data.  Last Pain:  Vitals:   08/09/23 1248  TempSrc:   PainSc: 4       Patients Stated Pain Goal: 3 (08/09/23 1248)  Complications: No notable events documented.

## 2023-08-10 ENCOUNTER — Encounter (HOSPITAL_COMMUNITY): Payer: Self-pay | Admitting: Interventional Radiology

## 2023-08-10 ENCOUNTER — Inpatient Hospital Stay (HOSPITAL_COMMUNITY): Payer: Medicare HMO

## 2023-08-10 DIAGNOSIS — J9601 Acute respiratory failure with hypoxia: Secondary | ICD-10-CM | POA: Diagnosis not present

## 2023-08-10 LAB — GLUCOSE, CAPILLARY
Glucose-Capillary: 98 mg/dL (ref 70–99)
Glucose-Capillary: 61 mg/dL — ABNORMAL LOW (ref 70–99)
Glucose-Capillary: 78 mg/dL (ref 70–99)
Glucose-Capillary: 133 mg/dL — ABNORMAL HIGH (ref 70–99)
Glucose-Capillary: 105 mg/dL — ABNORMAL HIGH (ref 70–99)
Glucose-Capillary: 110 mg/dL — ABNORMAL HIGH (ref 70–99)
Glucose-Capillary: 133 mg/dL — ABNORMAL HIGH (ref 70–99)
Glucose-Capillary: 144 mg/dL — ABNORMAL HIGH (ref 70–99)

## 2023-08-10 LAB — BPAM RBC
Blood Product Expiration Date: 202409082359
Blood Product Expiration Date: 202409132359
ISSUE DATE / TIME: 202408211541
ISSUE DATE / TIME: 202408211541
Unit Type and Rh: 6200
Unit Type and Rh: 6200

## 2023-08-10 LAB — CBC
HCT: 28.4 % — ABNORMAL LOW (ref 36.0–46.0)
Hemoglobin: 9.6 g/dL — ABNORMAL LOW (ref 12.0–15.0)
MCH: 31.2 pg (ref 26.0–34.0)
MCHC: 33.8 g/dL (ref 30.0–36.0)
MCV: 92.2 fL (ref 80.0–100.0)
Platelets: 111 10*3/uL — ABNORMAL LOW (ref 150–400)
RBC: 3.08 MIL/uL — ABNORMAL LOW (ref 3.87–5.11)
RDW: 14.6 % (ref 11.5–15.5)
WBC: 9.3 10*3/uL (ref 4.0–10.5)
nRBC: 0 % (ref 0.0–0.2)

## 2023-08-10 LAB — TYPE AND SCREEN
ABO/RH(D): A POS
Antibody Screen: NEGATIVE
Unit division: 0
Unit division: 0

## 2023-08-10 LAB — COMPREHENSIVE METABOLIC PANEL
ALT: 15 U/L (ref 0–44)
AST: 23 U/L (ref 15–41)
Albumin: 1.9 g/dL — ABNORMAL LOW (ref 3.5–5.0)
Alkaline Phosphatase: 60 U/L (ref 38–126)
Anion gap: 10 (ref 5–15)
BUN: 25 mg/dL — ABNORMAL HIGH (ref 8–23)
CO2: 25 mmol/L (ref 22–32)
Calcium: 7.7 mg/dL — ABNORMAL LOW (ref 8.9–10.3)
Chloride: 99 mmol/L (ref 98–111)
Creatinine, Ser: 0.9 mg/dL (ref 0.44–1.00)
GFR, Estimated: >60 mL/min (ref >60)
Glucose, Bld: 108 mg/dL — ABNORMAL HIGH (ref 70–99)
Potassium: 3.7 mmol/L (ref 3.5–5.1)
Sodium: 134 mmol/L — ABNORMAL LOW (ref 135–145)
Total Bilirubin: 2.7 mg/dL — ABNORMAL HIGH (ref 0.3–1.2)
Total Protein: 4.8 g/dL — ABNORMAL LOW (ref 6.5–8.1)

## 2023-08-10 LAB — AMMONIA: Ammonia: 53 umol/L — ABNORMAL HIGH (ref 9–35)

## 2023-08-10 LAB — TROPONIN I (HIGH SENSITIVITY)
Troponin I (High Sensitivity): 10 ng/L (ref <18)
Troponin I (High Sensitivity): 10 ng/L (ref <18)

## 2023-08-10 LAB — PHOSPHORUS: Phosphorus: 3.7 mg/dL (ref 2.5–4.6)

## 2023-08-10 LAB — MAGNESIUM: Magnesium: 1.6 mg/dL — ABNORMAL LOW (ref 1.7–2.4)

## 2023-08-10 MED ORDER — ENOXAPARIN SODIUM 40 MG/0.4ML IJ SOSY
40.0000 mg | PREFILLED_SYRINGE | INTRAMUSCULAR | Status: DC
  Administered 2023-08-10 – 2023-08-22 (×12): 40 mg via SUBCUTANEOUS
  Filled 2023-08-10 (×13): qty 0.4

## 2023-08-10 MED ORDER — MIDODRINE HCL 5 MG PO TABS
5.0000 mg | ORAL_TABLET | Freq: Three times a day (TID) | ORAL | Status: DC
  Administered 2023-08-10: 5 mg via ORAL
  Filled 2023-08-10: qty 1

## 2023-08-10 MED ORDER — LACTULOSE 10 GM/15ML PO SOLN
10.0000 g | Freq: Three times a day (TID) | ORAL | Status: DC
  Administered 2023-08-10 – 2023-08-11 (×4): 10 g via ORAL
  Filled 2023-08-10 (×4): qty 30

## 2023-08-10 MED ORDER — ALBUMIN HUMAN 25 % IV SOLN
25.0000 g | Freq: Four times a day (QID) | INTRAVENOUS | Status: AC
  Administered 2023-08-10 – 2023-08-11 (×4): 25 g via INTRAVENOUS
  Filled 2023-08-10 (×4): qty 100

## 2023-08-10 MED ORDER — POTASSIUM CHLORIDE CRYS ER 20 MEQ PO TBCR
40.0000 meq | EXTENDED_RELEASE_TABLET | Freq: Once | ORAL | Status: AC
  Administered 2023-08-10: 40 meq via ORAL
  Filled 2023-08-10: qty 2

## 2023-08-10 MED ORDER — RIFAXIMIN 550 MG PO TABS
550.0000 mg | ORAL_TABLET | Freq: Two times a day (BID) | ORAL | Status: DC
  Administered 2023-08-10 – 2023-08-22 (×25): 550 mg via ORAL
  Filled 2023-08-10 (×25): qty 1

## 2023-08-10 MED ORDER — MIDODRINE HCL 5 MG PO TABS
10.0000 mg | ORAL_TABLET | Freq: Three times a day (TID) | ORAL | Status: DC
  Administered 2023-08-10 – 2023-08-11 (×5): 10 mg via ORAL
  Filled 2023-08-10 (×6): qty 2

## 2023-08-10 MED ORDER — MAGNESIUM SULFATE 4 GM/100ML IV SOLN
4.0000 g | Freq: Once | INTRAVENOUS | Status: AC
  Administered 2023-08-10: 4 g via INTRAVENOUS
  Filled 2023-08-10: qty 100

## 2023-08-10 NOTE — Evaluation (Signed)
Physical Therapy Evaluation Patient Details Name: Ariel Arnold MRN: [id number] DOB: 02-18-1956 Today's Date: 08/10/2023  History of Present Illness  67 yo female admitted to [facility] 8/13 with SOB with Rt pleural effusion. 8/14 & 8/16 thoracentesis. 8/19 transfer to [facility]. 8/21 TIPS procedure with post procedure hypotension. PMHx; NASH cirrhosis with ascites, T2DM, HTN, PVD, osteomyelitis, depression, anxiety, Lt BKA  Clinical Impression  Pt pleasant and reports living alone in an apartment mainly performing wC transfers at baseline. Pt recently received prosthetic and reports beginning to use it but not walking yet and prosthetic not present on eval. Pt on 3L during session with SPO2 92% and reports RA at baseline. Pt with decreased strength, transfers, function and activity tolerance who will benefit from acute therapy to maximize mobility, safety and independence.   BP supine 106/45 (64) Sitting 89/36 (54) In chair 90/46 (60)       If plan is discharge home, recommend the following: A lot of help with walking and/or transfers;A lot of help with bathing/dressing/bathroom;Assistance with cooking/housework;Assist for transportation   Can travel by private vehicle   No    Equipment Recommendations None recommended by PT  Recommendations for Other Services  OT consult    Functional Status Assessment Patient has had a recent decline in their functional status and/or demonstrates limited ability to make significant improvements in function in a reasonable and predictable amount of time     Precautions / Restrictions Precautions Precautions: Fall;Other (comment) Precaution Comments: art line, Lt BKA      Mobility  Bed Mobility Overal bed mobility: Needs Assistance Bed Mobility: Supine to Sit     Supine to sit: Min assist, HOB elevated     General bed mobility comments: HOB 25 degrees with increased time and effort, use of rail to pivot to EOB    Transfers Overall  transfer level: Needs assistance   Transfers: Sit to/from Stand, Bed to chair/wheelchair/BSC Sit to Stand: Min assist Stand pivot transfers: Min assist         General transfer comment: pt stood with min assist and use of rail on left to rise from bed, pt then placed left knee in chair and performed stand pivot with min assist to guide hips and cues for hand placement and sequence to pivot to left into chair    Ambulation/Gait               General Gait Details: unable at baseline  Stairs            Wheelchair Mobility     Tilt Bed    Modified Rankin (Stroke Patients Only)       Balance Overall balance assessment: Needs assistance   Sitting balance-Leahy Scale: Fair     Standing balance support: Bilateral upper extremity supported Standing balance-Leahy Scale: Poor Standing balance comment: min assist and UB support in standing                             Pertinent Vitals/Pain Pain Assessment Pain Assessment: 0-10 Pain Score: 4  Pain Location: abdomen Pain Descriptors / Indicators: Sore Pain Intervention(s): Limited activity within patient's tolerance, Repositioned, RN gave pain meds during session    Home Living Family/patient expects to be discharged to:: Private residence Living Arrangements: Children Available Help at Discharge: Family;Available PRN/intermittently Type of Home: Apartment Home Access: Level entry       Home Layout: One level Home Equipment: Agricultural consultant (2 wheels);Grab  bars - tub/shower;Toilet riser;Wheelchair - manual;Hospital bed;Shower seat      Prior Function Prior Level of Function : Needs assist       Physical Assist : ADLs (physical)   ADLs (physical): Bathing Mobility Comments: transfers to [facility] at baseline and performs mobility at [facility] level, had recently received prosthesis and started working with it ADLs Comments: PCA 1x/wk for bathing and some housework. pt reports kitchen not wC accessible but  does what she can     Extremity/Trunk Assessment   Upper Extremity Assessment Upper Extremity Assessment: Generalized weakness    Lower Extremity Assessment Lower Extremity Assessment: Generalized weakness    Cervical / Trunk Assessment Cervical / Trunk Assessment: Kyphotic  Communication      Cognition Arousal: Alert Behavior During Therapy: WFL for tasks assessed/performed Overall Cognitive Status: Within Functional Limits for tasks assessed                                          General Comments      Exercises     Assessment/Plan    PT Assessment Patient needs continued PT services  PT Problem List Decreased strength;Decreased mobility;Decreased activity tolerance;Decreased balance;Decreased knowledge of use of DME       PT Treatment Interventions DME instruction;Functional mobility training;Therapeutic activities;Patient/family education;Balance training;Therapeutic exercise    PT Goals (Current goals can be found in the Care Plan section)  Acute Rehab PT Goals Patient Stated Goal: return home PT Goal Formulation: With patient Time For Goal Achievement: 08/24/23 Potential to Achieve Goals: Fair    Frequency Min 1X/week     Co-evaluation               AM-PAC PT "6 Clicks" Mobility  Outcome Measure Help needed turning from your back to your side while in a flat bed without using bedrails?: A Little Help needed moving from lying on your back to sitting on the side of a flat bed without using bedrails?: A Little Help needed moving to and from a bed to a chair (including a wheelchair)?: A Lot Help needed standing up from a chair using your arms (e.g., wheelchair or bedside chair)?: A Little Help needed to walk in hospital room?: Total Help needed climbing 3-5 steps with a railing? : Total 6 Click Score: 13    End of Session Equipment Utilized During Treatment: Oxygen Activity Tolerance: Patient tolerated treatment well Patient  left: in chair;with call bell/phone within reach;with chair alarm set;with nursing/sitter in room Nurse Communication: Mobility status PT Visit Diagnosis: Other abnormalities of gait and mobility (R26.89);Muscle weakness (generalized) (M62.81)    Time: 0732-0800 PT Time Calculation (min) (ACUTE ONLY): 28 min   Charges:   PT Evaluation $PT Eval Moderate Complexity: 1 Mod PT Treatments $Therapeutic Activity: 8-22 mins PT General Charges $$ ACUTE PT VISIT: 1 Visit         Merryl Hacker, PT Acute Rehabilitation Services Office: [phone]   Enedina Finner Raelyn Racette 08/10/2023, 10:08 AM

## 2023-08-10 NOTE — Progress Notes (Signed)
NAME:  Ariel Arnold, MRN:  [id number], DOB:  10-08-1956, LOS: 3 ADMISSION DATE:  08/07/2023, CONSULTATION DATE:  08/09/23 REFERRING MD:  TRH, CHIEF COMPLAINT:  Post-op hypoxia and hypotension    History of Present Illness:  Ariel Arnold is a 67 year old female with a PMHx of NASH cirrhosis complicated by portal hypertension, recurrent GI bleeding, gastric varices, T2DM, HTN, HLD, PVD, depression, and anxiety who presented to the [facility] ED on 8/13 with shortness of breath. She was found to have a right pleural effusion and a new supplemental oxygen requirement. She underwent thoracentesis x2 (8/14 & 8/16) for concern of hepatic hydrothorax. She was transferred to [facility] 9/19 and underwent TIPS with IR on 8/21. She developed postprocedure hypotension requiring pressor support and hypoxia requiring BiPAP, prompting transfer to ICU.   Pertinent  Medical History  NASH cirrhosis with ascites, gastric varices  T2DM HTN PVD  Depression, Anxiety  Significant Hospital Events: Including procedures, antibiotic start and stop dates in addition to other pertinent events   8/13 presented to [facility] with complaints of shortness of breath 8/14 underwent thoracentesis [facility] 8/16 underwent repeat thoracentesis at [facility] 8/19 transferred to [facility] for interventional radiology evaluation of TIPS procedure 8/21 underwent TIPS with interventional radiology, postprocedure patient was seen hypotensive and hypoxic resulting in initiation of pressors and PCCM consult 8/22 weaned to room air   Interim History / Subjective:  Patient reports feeling better overall. Denies any shortness of breath or dizziness. Continues to have mild chest soreness as reported yesterday. Is trying to improve PO intake.   Objective   Blood pressure (!) 76/48, pulse 71, temperature 98.2 F (36.8 C), temperature source Oral, resp. rate 18, height 5\' 8"  (1.727 m), weight 80.4 kg, SpO2 92%.     Vent Mode: PSV;BIPAP FiO2 (%):  [60 %] 60 % PEEP:  [5 cmH20] 5 cmH20 Pressure Support:  [5 cmH20-10 cmH20] 5 cmH20   Intake/Output Summary (Last 24 hours) at 08/10/2023 1003 Last data filed at 08/10/2023 0800 Gross per 24 hour  Intake 2506.07 ml  Output 6455 ml  Net -3948.93 ml   Filed Weights   08/09/23 1231 08/09/23 2110 08/10/23 0500  Weight: 83 kg 80.4 kg 80.4 kg    Examination: General: Chronically ill appearing, pale. No acute distress.  HENT: Normocephalic, atraumatic. PERRL. No JVD.  Lungs: Bilateral crackles, dullness to percussion worse on the right.  Cardiovascular: Normal rate and rhythm. No murmurs, rubs, or gallops.  Abdomen: Soft, non-tender, non-distended.  Extremities: Right first toe amputation, L BKA. Trace peripheral edema on right.  Neuro: Alert, oriented. No focal deficits. Mild asterixis.   Resolved Hospital Problem list   Hypoxia   Assessment & Plan:   NASH Cirrhosis  S/p TIPS 8/21 with post-procedure hypotension and respiratory distress, otherwise stable neurologic status.  - Continue lactulose, titrate to 2-3 bowel movements a day  - Start Rifaximin  - Phenylephrine infusion, MAP goal of >65   Acute Hypoxic Respiratory Failure  Recurrent Pleural Effusion Hepatic hydrothorax S/p thoracentesis x3. Shortness of breath has resolved with adequate O2 sat on room air  - Obtain repeat CXR  - Encourage IS use  - Currently holding Lasix, Spironolactone due to hypotension   Type 2 Diabetes Mellitus  SSI   Hyperlipidemia  Continue statin   Depression  Anxiety  Continue sertraline   Best Practice (right click and "Reselect all SmartList Selections" daily)   Diet/type: Regular DVT prophylaxis: not indicated due to thrombocytopenia  GI prophylaxis: PPI Lines: N/A Foley:  N/A Code Status:  DNR Last date of multidisciplinary goals of care discussion   Labs   CBC: Recent Labs  Lab 08/08/23 0309 08/09/23 0111 08/09/23 1708 08/09/23 1801  08/10/23 0359  WBC 3.1* 3.1*  --   --  9.3  HGB 8.7* 7.8* 8.5* 8.2* 9.6*  HCT 26.6* 24.2* 25.0* 24.0* 28.4*  MCV 96.7 95.7  --   --  92.2  PLT 80* 78*  --   --  111*    Basic Metabolic Panel: Recent Labs  Lab 08/08/23 0309 08/09/23 0111 08/09/23 1708 08/09/23 1801 08/10/23 0359  NA 134* 134* 138 139 134*  K 3.9 4.0 3.8 3.8 3.7  CL 99 98  --   --  99  CO2 29 28  --   --  25  GLUCOSE 281* 212*  --   --  108*  BUN 27* 28*  --   --  25*  CREATININE 1.29* 0.94  --   --  0.90  CALCIUM 8.3* 8.1*  --   --  7.7*  MG 1.4*  --   --   --  1.6*  PHOS  --   --   --   --  3.7   GFR: Estimated Creatinine Clearance: 65.7 mL/min (by C-G formula based on SCr of 0.9 mg/dL). Recent Labs  Lab 08/08/23 0309 08/09/23 0111 08/10/23 0359  WBC 3.1* 3.1* 9.3    Liver Function Tests: Recent Labs  Lab 08/08/23 0309 08/09/23 0111 08/10/23 0359  AST 21 18 23   ALT 15 15 15   ALKPHOS 76 75 60  BILITOT 1.4* 1.1 2.7*  PROT 6.0* 5.8* 4.8*  ALBUMIN 2.2* 2.1* 1.9*   No results for input(s): "LIPASE", "AMYLASE" in the last 168 hours. Recent Labs  Lab 08/08/23 0909 08/10/23 0558  AMMONIA 34 53*    ABG    Component Value Date/Time   PHART 7.374 08/09/2023 1801   PCO2ART 49.3 (H) 08/09/2023 1801   PO2ART 66 (L) 08/09/2023 1801   HCO3 28.8 (H) 08/09/2023 1801   TCO2 30 08/09/2023 1801   ACIDBASEDEF 4.0 (H) 05/21/2021 1429   O2SAT 92 08/09/2023 1801     Coagulation Profile: Recent Labs  Lab 08/08/23 0309 08/09/23 0111  INR 1.5* 1.5*    Cardiac Enzymes: No results for input(s): "CKTOTAL", "CKMB", "CKMBINDEX", "TROPONINI" in the last 168 hours.  HbA1C: Hgb A1c MFr Bld  Date/Time Value Ref Range Status  08/08/2023 03:09 AM 5.0 4.8 - 5.6 % Final    Comment:    (NOTE) Pre diabetes:          5.7%-6.4%  Diabetes:              >6.4%  Glycemic control for   <7.0% adults with diabetes   04/03/2022 05:33 AM 11.2 (H) 4.8 - 5.6 % Final    Comment:    (NOTE) Pre diabetes:           5.7%-6.4%  Diabetes:              >6.4%  Glycemic control for   <7.0% adults with diabetes     CBG: Recent Labs  Lab 08/09/23 2110 08/09/23 2322 08/10/23 0315 08/10/23 0720 08/10/23 0754  GLUCAP 138* 144* 98 61* 78    Review of Systems:   As per HPI   Past Medical History:  She,  has a past medical history of Anemia, Anxiety, Aortic atherosclerosis (HCC), Arthritis, AVM (arteriovenous malformation) of colon,  Depression, Essential hypertension, GI bleeding, Hepatic encephalopathy (HCC), History of kidney stones, History of RSV infection, Liver cirrhosis secondary to NASH (HCC) (2004), Obesity, Osteomyelitis (HCC), Peripheral vascular disease (HCC), Thrombocytopenia (HCC), and Type 2 diabetes mellitus (HCC).   Surgical History:   Past Surgical History:  Procedure Laterality Date   ABDOMINAL HYSTERECTOMY     AMPUTATION Left 04/05/2022   Procedure: Metatarsal amputation revision;  Surgeon: Louann Sjogren, MD;  Location: [facility] ORS;  Service: Podiatry;  Laterality: Left;  Surgical team to do block   AMPUTATION Left 08/26/2022   Procedure: LEFT BELOW KNEE AMPUTATION;  Surgeon: Nadara Mustard, MD;  Location: [facility] OR;  Service: Orthopedics;  Laterality: Left;   APPLICATION OF WOUND VAC Left 08/26/2022   Procedure: APPLICATION OF WOUND VAC;  Surgeon: Nadara Mustard, MD;  Location: [facility] OR;  Service: Orthopedics;  Laterality: Left;   COLONOSCOPY     Cystoscopy with ureteral stent     ESOPHAGOGASTRODUODENOSCOPY     ESOPHAGOGASTRODUODENOSCOPY (EGD) WITH PROPOFOL N/A 05/20/2021   Procedure: ESOPHAGOGASTRODUODENOSCOPY (EGD) WITH PROPOFOL;  Surgeon: Corbin Ade, MD;  Location: [facility];  Service: Endoscopy;  Laterality: N/A;  needs intubation   IR ANGIOGRAM FOLLOW UP STUDY  05/21/2021   IR ANGIOGRAM SELECTIVE EACH ADDITIONAL VESSEL  05/21/2021   IR EMBO VENOUS NOT HEMORR HEMANG  INC GUIDE ROADMAPPING  05/21/2021   IR US GUIDE VASC ACCESS RIGHT  05/21/2021   IR VENOGRAM RENAL UNI LEFT  05/21/2021    IRRIGATION AND DEBRIDEMENT FOOT Left 06/04/2022   Procedure: REVISIONAL TRAMSMETATARSAL AMPUTATION;  Surgeon: Candelaria Stagers, DPM;  Location: [facility] ORS;  Service: Podiatry;  Laterality: Left;   RADIOLOGY WITH ANESTHESIA N/A 05/21/2021   Procedure: IR WITH ANESTHESIA;  Surgeon: Radiologist, Medication, MD;  Location: [facility] OR;  Service: Radiology;  Laterality: N/A;   Toe amputations Bilateral    TUBAL LIGATION     WOUND DEBRIDEMENT Left 11/12/2021   Procedure: DEBRIDEMENT WOUND;FIRST METATARSAL RESECTION; APPLICATION OF SKIN GRAFT SUBSTITUTE;  Surgeon: Park Liter, DPM;  Location: [facility] ORS;  Service: Podiatry;  Laterality: Left;     Social History:   reports that she has quit smoking. Her smoking use included cigarettes. She has never used smokeless tobacco. She reports current alcohol use. She reports that she does not use drugs.   Family History:  Her family history includes Diabetes Mellitus II in her mother; Heart failure in her father and mother; Hyperlipidemia in her father and mother.   Allergies Allergies  Allergen Reactions   Tape Rash     Home Medications  Prior to Admission medications   Medication Sig Start Date End Date Taking? Authorizing Provider  isosorbide mononitrate (IMDUR) 30 MG 24 hr tablet Take by mouth. 08/07/23 08/06/24 Yes [provider]  albuterol (VENTOLIN HFA) 108 (90 Base) MCG/ACT inhaler Inhale 2 puffs into the lungs every 4 (four) hours as needed for shortness of breath or wheezing. 01/11/21   [provider]  Calcium Carbonate-Vit D-Min (CALCIUM 1200) 1200-1000 MG-UNIT CHEW Chew 2 tablets by mouth daily.    [provider]  carvedilol (COREG) 6.25 MG tablet Take 1 tablet (6.25 mg total) by mouth 2 (two) times daily. 04/11/22 08/21/23  Jerald Kief, MD  clotrimazole-betamethasone (LOTRISONE) cream Apply 1 Application topically daily as needed (for yeast/rash). 11/25/21   [provider]  diclofenac Sodium (VOLTAREN) 1 % GEL Apply  2 g topically in the morning and at bedtime. 12/02/21   [provider]  ferrous sulfate  325 (65 FE) MG tablet Take 325 mg by mouth in the morning and at bedtime.    [provider]  folic acid (FOLVITE) 1 MG tablet Take 1 mg by mouth 2 (two) times daily. 06/02/21   [provider]  furosemide (LASIX) 20 MG tablet Take 20 mg by mouth once a week. 05/27/21   [provider]  gabapentin (NEURONTIN) 600 MG tablet Take 600 mg by mouth 3 (three) times daily as needed (nerve pain). 07/23/21   [provider]  GLOBAL EASE INJECT PEN NEEDLES 32G X 4 MM MISC  08/27/21   [provider]  hydrOXYzine (ATARAX) 25 MG tablet Take 25 mg by mouth daily. 08/17/22   [provider]  insulin aspart (NOVOLOG) 100 UNIT/ML injection 0-20 Units, Subcutaneous, 3 times daily with meal CBG < 70: Implement Hypoglycemia measures CBG 70 - 120: 0 units CBG 121 - 150: 3 units CBG 151 - 200: 4 units CBG 201 - 250: 7 units CBG 251 - 300: 11 units CBG 301 - 350: 15 units CBG 351 - 400: 20 units CBG > 400: call MD 08/31/22   Maretta Bees, MD  ketorolac (ACULAR) 0.5 % ophthalmic solution Place 1 drop into both eyes 4 (four) times daily as needed (dry eyes). 09/06/21   [provider]  lactulose (CHRONULAC) 10 GM/15ML solution Take 15 mLs (10 g total) by mouth 2 (two) times daily. for ammonia levels 08/31/22   Ghimire, Werner Lean, MD  oxyCODONE (OXY IR/ROXICODONE) 5 MG immediate release tablet Take 1 tablet (5 mg total) by mouth every 6 (six) hours as needed for moderate pain (pain score 4-6). 09/13/22   Adonis Huguenin, NP  pantoprazole (PROTONIX) 40 MG tablet Take 40 mg by mouth daily as needed (heartburn). 05/27/21   [provider]  pravastatin (PRAVACHOL) 20 MG tablet Take 1 tablet (20 mg total) by mouth at bedtime. Take after completion of daptomycin 01/03/22   [name], Rebekah Chesterfield, MD  rifaximin (XIFAXAN) 550 MG TABS tablet Take 1 tablet (550 mg total) by mouth 2 (two)  times daily. 08/31/22   Ghimire, Werner Lean, MD  sertraline (ZOLOFT) 100 MG tablet Take 2 tablets (200 mg total) by mouth daily. Patient taking differently: Take 150 mg by mouth daily. 06/08/22 08/21/23  Erick Blinks, MD  sodium hypochlorite (DAKIN'S 1/2 STRENGTH) external solution Irrigate with 1 Application as directed daily. 03/08/23   Candelaria Stagers, DPM  spironolactone (ALDACTONE) 50 MG tablet Take 50 mg by mouth daily as needed (swelling). 05/27/21   [provider]  TOUJEO MAX SOLOSTAR 300 UNIT/ML Solostar Pen Inject 20 Units into the skin at bedtime. 08/31/22   Ghimire, Werner Lean, MD  vitamin C (ASCORBIC ACID) 500 MG tablet Take 500 mg by mouth 2 (two) times daily.    [provider]  Vitamin D, Ergocalciferol, (DRISDOL) 1.25 MG (50000 UNIT) CAPS capsule Take 1 capsule by mouth every 7 (seven) days. Mondays 04/05/21   [provider]    Tempie Hoist, MS4    Critical care attending attestation note:  Patient seen and examined and relevant ancillary tests reviewed.  I agree with the assessment and plan of care as outlined by Bess Harvest, MS IV.   Synopsis of assessment and plan:  67 year old woman with Child C NASH cirrhosis who required an elective TIPS procedure for recurrent hydrothorax. Came to ICU follow procedure for vasopressor support.   On examination, she is awake and sitting in chair and eating  breakfast with good appetite.  She feels her breathing has improved considerably.  No scleral icterus.  Mild asterixis but otherwise neurologically intact.  Chest has rales at both bases with dullness to percussion on the right side.  Abdomen is soft and nontender.  There is no peripheral edema.  Mild hyponatremia.  Creatinine normal.  Magnesium 1.6 normal LFTs bilirubin mildly elevated.  Ammonia mildly elevated.  Mild thrombocytopenia.  Chest x-ray shows persistent right sided infiltrate but diminished effusion.  -Suspect hypotension due to portosystemic shunting of  vasodilatory substances as well as hypovolemia from recent large-volume paracentesis/thoracentesis.  Given albumin today.  Started on midodrine.  Titrate phenylephrine to keep systolic blood pressure greater than 100.  Hold diuretics for now. -Incentive spirometry.  Repeat chest x-ray tomorrow.  Patient feels better so hopefully x-ray simply lagging behind clinical progress.  Hold on antibiotics for now. -Well-controlled hepatic encephalopathy.  Continue lactulose and start rifaximin.  Titrate to 2-3 soft bowel movements a day.  CRITICAL CARE Performed by: Lynnell Catalan   Total critical care time: 35 minutes  Critical care time was exclusive of separately billable procedures and treating other patients.  Critical care was necessary to treat or prevent imminent or life-threatening deterioration.  Critical care was time spent personally by me on the following activities: development of treatment plan with patient and/or surrogate as well as nursing, discussions with consultants, evaluation of patient's response to treatment, examination of patient, obtaining history from patient or surrogate, ordering and performing treatments and interventions, ordering and review of laboratory studies, ordering and review of radiographic studies, pulse oximetry, re-evaluation of patient's condition and participation in multidisciplinary rounds.  Lynnell Catalan, MD [facility] ICU Physician [facility]  Pager: [phone] Mobile: [phone] After hours: [phone].  08/10/2023, 3:36 PM

## 2023-08-10 NOTE — Inpatient Diabetes Management (Signed)
Inpatient Diabetes Program Recommendations  AACE/ADA: New Consensus Statement on Inpatient Glycemic Control (2015)  Target Ranges:  Prepandial:   less than 140 mg/dL      Peak postprandial:   less than 180 mg/dL (1-2 hours)      Critically ill patients:  140 - 180 mg/dL   Lab Results  Component Value Date   GLUCAP 133 (H) 08/10/2023   HGBA1C 5.0 08/08/2023    Review of Glycemic Control  Latest Reference Range & Units 08/10/23 03:15 08/10/23 07:20 08/10/23 07:54 08/10/23 11:22  Glucose-Capillary 70 - 99 mg/dL 98 61 (L) 78 130 (H)   Diabetes history: DM 2 Outpatient Diabetes medications:  Toujeo 20 units daily Novolog 0-20 units tid with meals  Current orders for Inpatient glycemic control:  Novolog 0-15 units tid with meals and HS Novolog 3 units tid with meals  Semglee 20 units q HS  Inpatient Diabetes Program Recommendations:    Per RN, patient is eating.  Note mild low blood sugar this AM.  Consider slight reduction in basal insulin to Semglee 16 units q HS and reduce Novolog correction to sensitive tid with meals.    Thanks,  Beryl Meager, RN, BC-ADM Inpatient Diabetes Coordinator Pager [phone]  (8a-5p)

## 2023-08-10 NOTE — Progress Notes (Signed)
Referring Physician(s): Dorris Singh  Supervising Physician: Roanna Banning  Patient Status:  [facility] - In-pt  Chief Complaint:  NASH cirrhosis with portal hypertension. Sequelae from portal hypertension includes recurrent GI bleeding, gastrorenal shunt, gastric varices and ascites. Rt hydrothorax    Subjective:  Procedure(s): 08/09/23 TRANSJUGULAR INTRAHEPATIC PORTOSYSTEMIC SHUNT (TIPS)  INTRACARDIAC ECHOCARDIOGRAPHY (ICE) THERAPEUTIC RIGHT THORACENTESIS and PARACENTESIS  Findings:  - access via the RIGHT jugular and femoral veins. - Successful ICE-guided TIPS with placement of 8 cm Viatorr stent - Reduction of portal HTN with portosystemic gradient from 8 to 4 - Esophageal varices noted, decompressed s/p TIPS. No embolization was performed. - Therapeutic RIGHT thoracentesis with 2.0 L of pleural fluid removed. - Therapeutic paracentesis with 3.9 L of serous ascites removed.  Pt doing well this am Feeling better; breathing easier Complains that IV was difficult this am Still has some soreness Rt chest  Ammonia 53 this am Cognition is fine--- answers all questions appropriately  Allergies: Tape  Medications: Prior to Admission medications   Medication Sig Start Date End Date Taking? Authorizing Provider  ACCU-CHEK GUIDE test strip  08/27/21   [provider]  Accu-Chek Softclix Lancets lancets SMARTSIG:2 Topical Twice Daily 08/27/21   [provider]  albuterol (VENTOLIN HFA) 108 (90 Base) MCG/ACT inhaler Inhale 2 puffs into the lungs every 4 (four) hours as needed for shortness of breath or wheezing. 01/11/21   [provider]  Blood Glucose Monitoring Suppl (ACCU-CHEK GUIDE) w/Device KIT  08/27/21   [provider]  Calcium Carbonate-Vit D-Min (CALCIUM 1200) 1200-1000 MG-UNIT CHEW Chew 2 tablets by mouth daily.    [provider]  carvedilol (COREG) 6.25 MG tablet Take 1 tablet (6.25 mg total) by mouth 2 (two) times daily.  04/11/22 08/21/23  Jerald Kief, MD  clotrimazole-betamethasone (LOTRISONE) cream Apply 1 Application topically daily as needed (for yeast/rash). 11/25/21   [provider]  diclofenac Sodium (VOLTAREN) 1 % GEL Apply 2 g topically in the morning and at bedtime. 12/02/21   [provider]  ferrous sulfate 325 (65 FE) MG tablet Take 325 mg by mouth in the morning and at bedtime.    [provider]  folic acid (FOLVITE) 1 MG tablet Take 1 mg by mouth 2 (two) times daily. 06/02/21   [provider]  furosemide (LASIX) 20 MG tablet Take 20 mg by mouth once a week. 05/27/21   [provider]  gabapentin (NEURONTIN) 600 MG tablet Take 600 mg by mouth 3 (three) times daily as needed (nerve pain). 07/23/21   [provider]  GLOBAL EASE INJECT PEN NEEDLES 32G X 4 MM MISC  08/27/21   [provider]  hydrOXYzine (ATARAX) 25 MG tablet Take 25 mg by mouth daily. 08/17/22   [provider]  insulin aspart (NOVOLOG) 100 UNIT/ML injection 0-20 Units, Subcutaneous, 3 times daily with meal CBG < 70: Implement Hypoglycemia measures CBG 70 - 120: 0 units CBG 121 - 150: 3 units CBG 151 - 200: 4 units CBG 201 - 250: 7 units CBG 251 - 300: 11 units CBG 301 - 350: 15 units CBG 351 - 400: 20 units CBG > 400: call MD 08/31/22   Maretta Bees, MD  ketorolac (ACULAR) 0.5 % ophthalmic solution Place 1 drop into both eyes 4 (four) times daily as needed (dry eyes). 09/06/21   [provider]  lactulose (CHRONULAC) 10 GM/15ML solution Take 15 mLs (10 g total) by mouth 2 (two) times daily. for ammonia  levels 08/31/22   Ghimire, Werner Lean, MD  oxyCODONE (OXY IR/ROXICODONE) 5 MG immediate release tablet Take 1 tablet (5 mg total) by mouth every 6 (six) hours as needed for moderate pain (pain score 4-6). 09/13/22   Adonis Huguenin, NP  pantoprazole (PROTONIX) 40 MG tablet Take 40 mg by mouth daily as needed (heartburn). 05/27/21   [provider]  pravastatin  (PRAVACHOL) 20 MG tablet Take 1 tablet (20 mg total) by mouth at bedtime. Take after completion of daptomycin 01/03/22   [name], Rebekah Chesterfield, MD  rifaximin (XIFAXAN) 550 MG TABS tablet Take 1 tablet (550 mg total) by mouth 2 (two) times daily. 08/31/22   Ghimire, Werner Lean, MD  sertraline (ZOLOFT) 100 MG tablet Take 2 tablets (200 mg total) by mouth daily. Patient taking differently: Take 150 mg by mouth daily. 06/08/22 08/21/23  Erick Blinks, MD  sodium hypochlorite (DAKIN'S 1/2 STRENGTH) external solution Irrigate with 1 Application as directed daily. 03/08/23   Candelaria Stagers, DPM  spironolactone (ALDACTONE) 50 MG tablet Take 50 mg by mouth daily as needed (swelling). 05/27/21   [provider]  TOUJEO MAX SOLOSTAR 300 UNIT/ML Solostar Pen Inject 20 Units into the skin at bedtime. 08/31/22   Ghimire, Werner Lean, MD  vitamin C (ASCORBIC ACID) 500 MG tablet Take 500 mg by mouth 2 (two) times daily.    [provider]  Vitamin D, Ergocalciferol, (DRISDOL) 1.25 MG (50000 UNIT) CAPS capsule Take 1 capsule by mouth every 7 (seven) days. Mondays 04/05/21   [provider]     Vital Signs: BP (!) 118/45   Pulse 84   Temp 98.1 F (36.7 C) (Oral)   Resp 20   Ht 5\' 8"  (1.727 m)   Wt 177 lb 4 oz (80.4 kg)   SpO2 97%   BMI 26.95 kg/m   Physical Exam Vitals reviewed.  Neck:     Comments: RT neck site is c/d/I NT no bleeding; no hematoma Cardiovascular:     Rate and Rhythm: Normal rate and regular rhythm.  Pulmonary:     Effort: Pulmonary effort is normal.     Breath sounds: Normal breath sounds. No wheezing.  Abdominal:     Palpations: Abdomen is soft.     Comments: Rt abd site is c/d/I NT no bleeding No hematoma  Musculoskeletal:        General: Normal range of motion.  Skin:    General: Skin is warm.     Comments: Rt chest site c/d/I NT no bleeding  Rt groin site is c/d/I NT no bleeding No hematoma  Neurological:     Mental Status: She is alert and oriented to  person, place, and time.  Psychiatric:        Behavior: Behavior normal.     Imaging: DG Chest Portable 1 View  Result Date: 08/09/2023 CLINICAL DATA:  Postop. EXAM: PORTABLE CHEST 1 VIEW COMPARISON:  Chest x-ray 08/07/2023 FINDINGS: Enlarged cardiopericardial silhouette. Calcified aorta. Decreasing right effusion compared to prior x-ray. There is diffuse heterogeneous opacity involving the right hemithorax, overall increased from previous. There is also increasing parenchymal opacity left mid to lower lung. No pneumothorax. Calcified aorta. Tips shunt in the right upper quadrant. Embolization material left upper quadrant overlapping cardiac leads. IMPRESSION: Improving right effusion with residual. Increasing bilateral lung opacities. No pneumothorax. Recommend follow-up Electronically Signed   By: Karen Kays M.D.   On: 08/09/2023 18:50   CT ANGIO ABD/PELVIS BRTO  Result Date: 08/09/2023 CLINICAL DATA:  Portal hypertension EXAM: CTA ABDOMEN AND PELVIS WITHOUT AND WITH CONTRAST TECHNIQUE: Multidetector CT imaging of the abdomen and pelvis was performed using the standard protocol during bolus administration of intravenous contrast. Multiplanar reconstructed images and MIPs were obtained and reviewed to evaluate the vascular anatomy. RADIATION DOSE REDUCTION: This exam was performed according to the departmental dose-optimization program which includes automated exposure control, adjustment of the mA and/or kV according to patient size and/or use of iterative reconstruction technique. CONTRAST:  OMNIPAQUE IOHEXOL 350 MG/ML SOLN COMPARISON:  CT AP 05/22/2021 and 05/20/2021.  IR BRTO 05/21/2021. FINDINGS: VASCULAR Arterial: Mild burden of aortoiliac atherosclerosis. Widely patent celiac axis, SMA, IMA, renal and pelvic arteries. No aneurysmal dilatation, dissection, vasculitis or significant stenosis. Venous: Prior coil embolization of gastric varices, with dense coil pack at the LEFT quadrant.  RIGHT paraesophageal varices. Widely patent IVC, hepatic veins, portal, SMV and splenic veins. Review of the MIP images confirms the above findings. NON-VASCULAR Lower chest: Mitral valve calcifications. At least moderate volume RIGHT pleural effusion. LEFT lung is comparatively clear, with trace linear basilar opacities. Hepatobiliary: Shrunken liver with coarse nodular contour. No discrete focal hepatic lesion. Hepatomegaly, with the RIGHT lobe measuring 19 cm. Cholecystectomy. No biliary dilatation. Pancreas: No pancreatic ductal dilatation or surrounding inflammatory changes. Spleen: Splenomegaly, measuring up to 19.5 cm Adrenals/Urinary Tract: Adrenal glands are unremarkable. 1.4 cm RIGHT midpolar simple renal cyst measuring 19 HU. Kidneys are otherwise normal, without renal calculi or hydronephrosis. Bladder is decompressed. Stomach/Bowel: Stomach is within normal limits. Appendix appears normal. Nonobstructed small bowel. Nondilated colon. Mild generalized bowel thickening. No evidence of bowel distention, or inflammatory change. Lymphatic: No enlarged abdominal or pelvic lymph nodes. Reproductive: Hysterectomy. No adnexal mass. Other: No abdominal wall hernia. Moderate volume of abdominopelvic ascites. Musculoskeletal: Diffuse moderate body wall edema. Multilevel degenerative of the spine, including vacuum disc phenomenon at the lower lumbar spine, and chronic-appearing L3 superior endplate compression deformity. No acute osseous findings. IMPRESSION: VASCULAR 1. Patent IVC, hepatic and portal veins. 2. Coil embolization of prior gastric varices without evidence of residual. RIGHT paraesophageal varices. 3.  Aortic Atherosclerosis (ICD10-I70.0). NON-VASCULAR 1. Cirrhosis with hepatosplenomegaly, and stigmata of portal venous hypertension. 2. Moderate-to-large volume RIGHT pleural effusion and abdominopelvic ascites. 3. Mild generalized bowel thickening, favored consistent with portal enteropathy. 4. 1.4 cm  RIGHT Bosniak I benign renal cyst. No follow-up imaging is recommended. JACR 2018 Feb; 264-273, Management of the Incidental Renal Mass on CT, [employer] 2021; 814-848, Bosniak Classification of Cystic Renal Masses, Version 2019 5.  Additional incidental, chronic and senescent findings as above. Roanna Banning, MD [facility] Electronically Signed   By: Roanna Banning M.D.   On: 08/09/2023 07:44   ECHOCARDIOGRAM COMPLETE  Result Date: 08/08/2023    ECHOCARDIOGRAM REPORT   Patient Name:   Ariel Arnold Date of Exam: 08/08/2023 Medical Rec #:  [id number]       Height:       68.0 in Accession #:    [id number]      Weight:       182.1 lb Date of Birth:  1956/06/05        BSA:          1.964 m Patient Age:    67 years        BP:           115/56 mmHg Patient Gender: F               HR:  76 bpm. Exam Location:  Inpatient Procedure: 2D Echo, Cardiac Doppler and Color Doppler Indications:    Preoperative evaluation  History:        Patient has prior history of Echocardiogram examinations, most                 recent 06/07/2022. CAD; Risk Factors:Hypertension, Diabetes and                 Dyslipidemia. CKD, stage 2.  Sonographer:    Lucendia Herrlich Referring Phys: [id number] JON MUGWERU IMPRESSIONS  1. Left ventricular ejection fraction, by estimation, is 60 to 65%. The left ventricle has normal function. The left ventricle has no regional wall motion abnormalities. There is mild left ventricular hypertrophy of the basal-septal segment. Left ventricular diastolic parameters are indeterminate.  2. Right ventricular systolic function is normal. The right ventricular size is normal. There is normal pulmonary artery systolic pressure. The estimated right ventricular systolic pressure is 26.0 mmHg.  3. The mitral valve is degenerative. No evidence of mitral valve regurgitation. Mild mitral stenosis. The mean mitral valve gradient is 3.5 mmHg. Moderate mitral  annular calcification.  4. The aortic valve is tricuspid. Aortic valve regurgitation is not visualized. Aortic valve sclerosis is present, with no evidence of aortic valve stenosis. Aortic valve Vmax measures 1.38 m/s.  5. The inferior vena cava is normal in size with greater than 50% respiratory variability, suggesting right atrial pressure of 3 mmHg. FINDINGS  Left Ventricle: Left ventricular ejection fraction, by estimation, is 60 to 65%. The left ventricle has normal function. The left ventricle has no regional wall motion abnormalities. The left ventricular internal cavity size was normal in size. There is  mild left ventricular hypertrophy of the basal-septal segment. Left ventricular diastolic parameters are indeterminate. Normal left ventricular filling pressure. Right Ventricle: The right ventricular size is normal. No increase in right ventricular wall thickness. Right ventricular systolic function is normal. There is normal pulmonary artery systolic pressure. The tricuspid regurgitant velocity is 2.40 m/s, and  with an assumed right atrial pressure of 3 mmHg, the estimated right ventricular systolic pressure is 26.0 mmHg. Left Atrium: Left atrial size was normal in size. Right Atrium: Right atrial size was normal in size. Pericardium: There is no evidence of pericardial effusion. Mitral Valve: The mitral valve is degenerative in appearance. Moderate mitral annular calcification. No evidence of mitral valve regurgitation. Mild mitral valve stenosis. MV peak gradient, 6.7 mmHg. The mean mitral valve gradient is 3.5 mmHg. Tricuspid Valve: The tricuspid valve is normal in structure. Tricuspid valve regurgitation is mild . No evidence of tricuspid stenosis. Aortic Valve: The aortic valve is tricuspid. Aortic valve regurgitation is not visualized. Aortic valve sclerosis is present, with no evidence of aortic valve stenosis. Aortic valve peak gradient measures 7.6 mmHg. Pulmonic Valve: The pulmonic valve was  normal in structure. Pulmonic valve regurgitation is trivial. No evidence of pulmonic stenosis. Aorta: The aortic root is normal in size and structure. Venous: The inferior vena cava is normal in size with greater than 50% respiratory variability, suggesting right atrial pressure of 3 mmHg. IAS/Shunts: No atrial level shunt detected by color flow Doppler.  LEFT VENTRICLE PLAX 2D LVIDd:         4.40 cm   Diastology LVIDs:         2.80 cm   LV e' medial:    6.10 cm/s LV PW:         1.00 cm   LV E/e' medial:  14.9  LV IVS:        1.20 cm   LV e' lateral:   11.60 cm/s LVOT diam:     2.10 cm   LV E/e' lateral: 7.8 LV SV:         53 LV SV Index:   27 LVOT Area:     3.46 cm  RIGHT VENTRICLE             IVC RV S prime:     14.10 cm/s  IVC diam: 1.60 cm TAPSE (M-mode): 2.4 cm LEFT ATRIUM             Index        RIGHT ATRIUM          Index LA diam:        4.30 cm 2.19 cm/m   RA Area:     9.13 cm LA Vol (A2C):   43.3 ml 22.05 ml/m  RA Volume:   15.90 ml 8.10 ml/m LA Vol (A4C):   40.0 ml 20.37 ml/m LA Biplane Vol: 41.4 ml 21.08 ml/m  AORTIC VALVE AV Area (Vmax): 2.04 cm AV Vmax:        138.00 cm/s AV Peak Grad:   7.6 mmHg LVOT Vmax:      81.47 cm/s LVOT Vmean:     52.067 cm/s LVOT VTI:       0.154 m  AORTA Ao Root diam: 3.30 cm Ao Asc diam:  3.30 cm MITRAL VALVE                TRICUSPID VALVE MV Area (PHT): 2.99 cm     TR Peak grad:   23.0 mmHg MV Area VTI:   1.33 cm     TR Vmax:        240.00 cm/s MV Peak grad:  6.7 mmHg MV Mean grad:  3.5 mmHg     SHUNTS MV Vmax:       1.29 m/s     Systemic VTI:  0.15 m MV Vmean:      85.6 cm/s    Systemic Diam: 2.10 cm MV Decel Time: 254 msec MR Peak grad: 15.5 mmHg MR Vmax:      197.00 cm/s MV E velocity: 90.80 cm/s MV A velocity: 109.00 cm/s MV E/A ratio:  0.83 Armanda Magic MD Electronically signed by Armanda Magic MD Signature Date/Time: 08/08/2023/4:51:49 PM    Final    DG CHEST PORT 1 VIEW  Result Date: 08/07/2023 CLINICAL DATA:  Recurrent right pleural effusion.  [id number].  EXAM: PORTABLE CHEST 1 VIEW COMPARISON:  Portable chest 08/05/2023 FINDINGS: 4:58 p.m. A large right pleural effusion with apical extension and possible partial apical loculation is again noted, slightly increased in size. There is a small portion of the perihilar right upper lung field remaining aerated, less than previously with linear atelectasis and hazy opacity. There is no substantial left pleural effusion. Interstitial and patchy opacities of the left mid and lower lung fields appear similar with perihilar linear atelectatic bands. There is mild cardiomegaly. The aortic arch is heavily calcified. Central vessels appear normal in caliber. IMPRESSION: 1. Large right pleural effusion with apical extension and possible partial apical loculation, slightly increased in size. 2. Small portion of the perihilar right upper lung field remaining aerated, less than previously. 3. Interstitial and patchy opacities of the left mid and lower lung fields appear similar. 4. Mild cardiomegaly. 5. Aortic atherosclerosis. Electronically Signed   By: Almira Bar M.D.   On: 08/07/2023 20:52  Labs:  CBC: Recent Labs    02/02/23 2114 08/08/23 0309 08/09/23 0111 08/09/23 1708 08/09/23 1801 08/10/23 0359  WBC 2.8* 3.1* 3.1*  --   --  9.3  HGB 9.6* 8.7* 7.8* 8.5* 8.2* 9.6*  HCT 29.5* 26.6* 24.2* 25.0* 24.0* 28.4*  PLT 81* 80* 78*  --   --  111*    COAGS: Recent Labs    08/08/23 0309 08/09/23 0111  INR 1.5* 1.5*    BMP: Recent Labs    02/02/23 2114 08/08/23 0309 08/09/23 0111 08/09/23 1708 08/09/23 1801 08/10/23 0359  NA 134* 134* 134* 138 139 134*  K 3.7 3.9 4.0 3.8 3.8 3.7  CL 102 99 98  --   --  99  CO2 26 29 28   --   --  25  GLUCOSE 219* 281* 212*  --   --  108*  BUN 15 27* 28*  --   --  25*  CALCIUM 8.6* 8.3* 8.1*  --   --  7.7*  CREATININE 0.84 1.29* 0.94  --   --  0.90  GFRNONAA >60 45* >60  --   --  >60    LIVER FUNCTION TESTS: Recent Labs    02/02/23 2114 08/08/23 0309  08/09/23 0111 08/10/23 0359  BILITOT 1.1 1.4* 1.1 2.7*  AST 35 21 18 23   ALT 24 15 15 15   ALKPHOS 99 76 75 60  PROT 6.2* 6.0* 5.8* 4.8*  ALBUMIN 2.7* 2.2* 2.1* 1.9*    Assessment and Plan:  Post TIPS- done 8/21 in IR Doing well Will follow few days She will hear from outpt IR scheduler for follow up date and time  Electronically Signed: Robet Leu, PA-C 08/10/2023, 6:55 AM   I spent a total of 15 Minutes at the the patient's bedside AND on the patient's hospital floor or unit, greater than 50% of which was counseling/coordinating care for TIPs

## 2023-08-10 NOTE — Anesthesia Postprocedure Evaluation (Signed)
Anesthesia Post Note  Patient: Ariel Arnold  Procedure(s) Performed: TRANS-JUGULAR INTRAHEPATIC PORTAL SHUNT (TIPS)     Patient location during evaluation: PACU Anesthesia Type: General Level of consciousness: awake Pain management: pain level controlled Vital Signs Assessment: post-procedure vital signs reviewed and stable Respiratory status: spontaneous breathing Cardiovascular status: blood pressure returned to baseline and stable Postop Assessment: no apparent nausea or vomiting Anesthetic complications: no Comments: Respiratory support required. Recommended at least progressive care for continued care.    No notable events documented.              Shelton Silvas

## 2023-08-11 ENCOUNTER — Inpatient Hospital Stay (HOSPITAL_COMMUNITY): Payer: Medicare HMO

## 2023-08-11 DIAGNOSIS — J9601 Acute respiratory failure with hypoxia: Secondary | ICD-10-CM | POA: Diagnosis not present

## 2023-08-11 LAB — CBC
HCT: 23.8 % — ABNORMAL LOW (ref 36.0–46.0)
Hemoglobin: 7.9 g/dL — ABNORMAL LOW (ref 12.0–15.0)
MCH: 31.6 pg (ref 26.0–34.0)
MCHC: 33.2 g/dL (ref 30.0–36.0)
MCV: 95.2 fL (ref 80.0–100.0)
Platelets: 65 10*3/uL — ABNORMAL LOW (ref 150–400)
RBC: 2.5 MIL/uL — ABNORMAL LOW (ref 3.87–5.11)
RDW: 15.3 % (ref 11.5–15.5)
WBC: 5.1 10*3/uL (ref 4.0–10.5)
nRBC: 0 % (ref 0.0–0.2)

## 2023-08-11 LAB — BASIC METABOLIC PANEL
Anion gap: 9 (ref 5–15)
BUN: 30 mg/dL — ABNORMAL HIGH (ref 8–23)
CO2: 23 mmol/L (ref 22–32)
Calcium: 7.9 mg/dL — ABNORMAL LOW (ref 8.9–10.3)
Chloride: 104 mmol/L (ref 98–111)
Creatinine, Ser: 1.1 mg/dL — ABNORMAL HIGH (ref 0.44–1.00)
GFR, Estimated: 54 mL/min — ABNORMAL LOW (ref >60)
Glucose, Bld: 103 mg/dL — ABNORMAL HIGH (ref 70–99)
Potassium: 4.2 mmol/L (ref 3.5–5.1)
Sodium: 136 mmol/L (ref 135–145)

## 2023-08-11 LAB — MAGNESIUM: Magnesium: 2.2 mg/dL (ref 1.7–2.4)

## 2023-08-11 LAB — GLUCOSE, CAPILLARY
Glucose-Capillary: 97 mg/dL (ref 70–99)
Glucose-Capillary: 92 mg/dL (ref 70–99)
Glucose-Capillary: 116 mg/dL — ABNORMAL HIGH (ref 70–99)
Glucose-Capillary: 124 mg/dL — ABNORMAL HIGH (ref 70–99)

## 2023-08-11 LAB — AMMONIA: Ammonia: 57 umol/L — ABNORMAL HIGH (ref 9–35)

## 2023-08-11 MED ORDER — LACTULOSE 10 GM/15ML PO SOLN
20.0000 g | Freq: Three times a day (TID) | ORAL | Status: DC
  Administered 2023-08-11 – 2023-08-20 (×25): 20 g via ORAL
  Filled 2023-08-11 (×27): qty 30

## 2023-08-11 MED ORDER — FUROSEMIDE 10 MG/ML IJ SOLN
40.0000 mg | Freq: Once | INTRAMUSCULAR | Status: AC
  Administered 2023-08-11: 40 mg via INTRAVENOUS
  Filled 2023-08-11: qty 4

## 2023-08-11 NOTE — Progress Notes (Signed)
NAME:  Ariel Arnold, MRN:  [id number], DOB:  October 01, 1956, LOS: 4 ADMISSION DATE:  08/07/2023, CONSULTATION DATE:  08/09/23 REFERRING MD:  TRH, CHIEF COMPLAINT:  Post-op hypoxia and hypotension    History of Present Illness:  Ariel Arnold is a 67 year old female with a PMHx of NASH cirrhosis complicated by portal hypertension, recurrent GI bleeding, gastric varices, T2DM, HTN, HLD, PVD, depression, and anxiety who presented to the [facility] ED on 8/13 with shortness of breath. She was found to have a right pleural effusion and a new supplemental oxygen requirement. She underwent thoracentesis x2 (8/14 & 8/16) for concern of hepatic hydrothorax. She was transferred to [facility] 9/19 and underwent TIPS with IR on 8/21. She developed postprocedure hypotension requiring pressor support and hypoxia requiring BiPAP, prompting transfer to ICU.   Pertinent  Medical History  NASH cirrhosis with ascites, gastric varices  T2DM HTN PVD  Depression, Anxiety  Significant Hospital Events: Including procedures, antibiotic start and stop dates in addition to other pertinent events   8/13 presented to [facility] with complaints of shortness of breath 8/14 underwent thoracentesis [facility] 8/16 underwent repeat thoracentesis at [facility] 8/19 transferred to [facility] for interventional radiology evaluation of TIPS procedure 8/21 underwent TIPS with interventional radiology, postprocedure patient was seen hypotensive and hypoxic resulting in initiation of pressors and PCCM consult 8/22 weaned to room air   Interim History / Subjective:  Increased shortness of breath and confusion today.  Objective   Blood pressure (!) 104/90, pulse 77, temperature 98.5 F (36.9 C), temperature source Oral, resp. rate 19, height 5\' 8"  (1.727 m), weight 80.4 kg, SpO2 95%.        Intake/Output Summary (Last 24 hours) at 08/11/2023 2147 Last data filed at 08/11/2023 1800 Gross per 24 hour  Intake  863.65 ml  Output 1250 ml  Net -386.35 ml   Filed Weights   08/09/23 1231 08/09/23 2110 08/10/23 0500  Weight: 83 kg 80.4 kg 80.4 kg    Examination: General: Chronically ill appearing, pale. Moderate distress HENT: Normocephalic, atraumatic. PERRL. No JVD.  Lungs: Bilateral crackles, dullness to percussion worse on the right.  Cardiovascular: Normal rate and rhythm. No murmurs, rubs, or gallops.  Abdomen: Soft, non-tender, non-distended.  Extremities: Right first toe amputation, L BKA. Trace peripheral edema on right.  Neuro: Alert, oriented. No focal deficits. Mild asterixis.   Resolved Hospital Problem list    Hypotension.  Assessment & Plan:   NASH Cirrhosis  S/p TIPS 8/21 with post-procedure hypotension and respiratory distress, otherwise stable neurologic status.  - increase lactulose, titrate to 2-3 bowel movements a day  - Start Rifaximin   Acute Hypoxic Respiratory Failure  Recurrent Pleural Effusion Hepatic hydrothorax S/p thoracentesis x3. Shortness of breath has resolved with adequate O2 sat on room air  - Diurese today - Encourage IS use - Consider repeat thoracentesis but volume relatively small on [place].  Type 2 Diabetes Mellitus  SSI   Hyperlipidemia  Continue statin   Depression  Anxiety  Continue sertraline   Best Practice (right click and "Reselect all SmartList Selections" daily)   Diet/type: Regular DVT prophylaxis: not indicated due to thrombocytopenia  GI prophylaxis: PPI Lines: N/A Foley:  N/A Code Status:  DNR Last date of multidisciplinary goals of care discussion   Labs   CBC: Recent Labs  Lab 08/08/23 0309 08/09/23 0111 08/09/23 1708 08/09/23 1801 08/10/23 0359 08/11/23 0511  WBC 3.1* 3.1*  --   --  9.3 5.1  HGB 8.7* 7.8* 8.5* 8.2* 9.6* 7.9*  HCT 26.6* 24.2* 25.0* 24.0* 28.4* 23.8*  MCV 96.7 95.7  --   --  92.2 95.2  PLT 80* 78*  --   --  111* 65*    Basic Metabolic Panel: Recent Labs  Lab 08/08/23 0309  08/09/23 0111 08/09/23 1708 08/09/23 1801 08/10/23 0359 08/11/23 0511  NA 134* 134* 138 139 134* 136  K 3.9 4.0 3.8 3.8 3.7 4.2  CL 99 98  --   --  99 104  CO2 29 28  --   --  25 23  GLUCOSE 281* 212*  --   --  108* 103*  BUN 27* 28*  --   --  25* 30*  CREATININE 1.29* 0.94  --   --  0.90 1.10*  CALCIUM 8.3* 8.1*  --   --  7.7* 7.9*  MG 1.4*  --   --   --  1.6* 2.2  PHOS  --   --   --   --  3.7  --    GFR: Estimated Creatinine Clearance: 53.7 mL/min (A) (by C-G formula based on SCr of 1.1 mg/dL (H)). Recent Labs  Lab 08/08/23 0309 08/09/23 0111 08/10/23 0359 08/11/23 0511  WBC 3.1* 3.1* 9.3 5.1    Liver Function Tests: Recent Labs  Lab 08/08/23 0309 08/09/23 0111 08/10/23 0359  AST 21 18 23   ALT 15 15 15   ALKPHOS 76 75 60  BILITOT 1.4* 1.1 2.7*  PROT 6.0* 5.8* 4.8*  ALBUMIN 2.2* 2.1* 1.9*   No results for input(s): "LIPASE", "AMYLASE" in the last 168 hours. Recent Labs  Lab 08/08/23 0909 08/10/23 0558 08/11/23 0511  AMMONIA 34 53* 57*    ABG    Component Value Date/Time   PHART 7.374 08/09/2023 1801   PCO2ART 49.3 (H) 08/09/2023 1801   PO2ART 66 (L) 08/09/2023 1801   HCO3 28.8 (H) 08/09/2023 1801   TCO2 30 08/09/2023 1801   ACIDBASEDEF 4.0 (H) 05/21/2021 1429   O2SAT 92 08/09/2023 1801     Coagulation Profile: Recent Labs  Lab 08/08/23 0309 08/09/23 0111  INR 1.5* 1.5*    Cardiac Enzymes: No results for input(s): "CKTOTAL", "CKMB", "CKMBINDEX", "TROPONINI" in the last 168 hours.  HbA1C: Hgb A1c MFr Bld  Date/Time Value Ref Range Status  08/08/2023 03:09 AM 5.0 4.8 - 5.6 % Final    Comment:    (NOTE) Pre diabetes:          5.7%-6.4%  Diabetes:              >6.4%  Glycemic control for   <7.0% adults with diabetes   04/03/2022 05:33 AM 11.2 (H) 4.8 - 5.6 % Final    Comment:    (NOTE) Pre diabetes:          5.7%-6.4%  Diabetes:              >6.4%  Glycemic control for   <7.0% adults with diabetes     CBG: Recent Labs  Lab  08/10/23 2154 08/11/23 0734 08/11/23 1156 08/11/23 1542 08/11/23 2113  GLUCAP 133* 97 92 116* 124*    Review of Systems:   As per HPI   Past Medical History:  She,  has a past medical history of Anemia, Anxiety, Aortic atherosclerosis (HCC), Arthritis, AVM (arteriovenous malformation) of colon, Depression, Essential hypertension, GI bleeding, Hepatic encephalopathy (HCC), History of kidney stones, History of RSV infection, Liver cirrhosis secondary to NASH (HCC) (2004), Obesity, Osteomyelitis (HCC),  Peripheral vascular disease (HCC), Thrombocytopenia (HCC), and Type 2 diabetes mellitus (HCC).   Surgical History:   Past Surgical History:  Procedure Laterality Date   ABDOMINAL HYSTERECTOMY     AMPUTATION Left 04/05/2022   Procedure: Metatarsal amputation revision;  Surgeon: Louann Sjogren, MD;  Location: [facility] ORS;  Service: Podiatry;  Laterality: Left;  Surgical team to do block   AMPUTATION Left 08/26/2022   Procedure: LEFT BELOW KNEE AMPUTATION;  Surgeon: Nadara Mustard, MD;  Location: [facility] OR;  Service: Orthopedics;  Laterality: Left;   APPLICATION OF WOUND VAC Left 08/26/2022   Procedure: APPLICATION OF WOUND VAC;  Surgeon: Nadara Mustard, MD;  Location: [facility] OR;  Service: Orthopedics;  Laterality: Left;   COLONOSCOPY     Cystoscopy with ureteral stent     ESOPHAGOGASTRODUODENOSCOPY     ESOPHAGOGASTRODUODENOSCOPY (EGD) WITH PROPOFOL N/A 05/20/2021   Procedure: ESOPHAGOGASTRODUODENOSCOPY (EGD) WITH PROPOFOL;  Surgeon: Corbin Ade, MD;  [facility];  Service: Endoscopy;  Laterality: N/A;  needs intubation   IR ANGIOGRAM FOLLOW UP STUDY  05/21/2021   IR ANGIOGRAM SELECTIVE EACH ADDITIONAL VESSEL  05/21/2021   IR EMBO VENOUS NOT HEMORR HEMANG  INC GUIDE ROADMAPPING  05/21/2021   IR PARACENTESIS  08/09/2023   IR THORACENTESIS ASP PLEURAL SPACE W/IMG GUIDE  08/09/2023   IR TIPS  08/09/2023   IR US GUIDE VASC ACCESS RIGHT  05/21/2021   IR US GUIDE VASC ACCESS RIGHT  08/09/2023   IR VENOGRAM  RENAL UNI LEFT  05/21/2021   IRRIGATION AND DEBRIDEMENT FOOT Left 06/04/2022   Procedure: REVISIONAL TRAMSMETATARSAL AMPUTATION;  Surgeon: Candelaria Stagers, DPM;  Location: [facility] ORS;  Service: Podiatry;  Laterality: Left;   RADIOLOGY WITH ANESTHESIA N/A 05/21/2021   Procedure: IR WITH ANESTHESIA;  Surgeon: Radiologist, Medication, MD;  Location: [facility] OR;  Service: Radiology;  Laterality: N/A;   TIPS PROCEDURE N/A 08/09/2023   Procedure: TRANS-JUGULAR INTRAHEPATIC PORTAL SHUNT (TIPS);  Surgeon: Roanna Banning, MD;  Location: [facility] OR;  Service: Radiology;  Laterality: N/A;   Toe amputations Bilateral    TUBAL LIGATION     WOUND DEBRIDEMENT Left 11/12/2021   Procedure: DEBRIDEMENT WOUND;FIRST METATARSAL RESECTION; APPLICATION OF SKIN GRAFT SUBSTITUTE;  Surgeon: Park Liter, DPM;  Location: [facility] ORS;  Service: Podiatry;  Laterality: Left;     Social History:   reports that she has quit smoking. Her smoking use included cigarettes. She has never used smokeless tobacco. She reports current alcohol use. She reports that she does not use drugs.   Family History:  Her family history includes Diabetes Mellitus II in her mother; Heart failure in her father and mother; Hyperlipidemia in her father and mother.   Allergies Allergies  Allergen Reactions   Tape Rash     Home Medications  Prior to Admission medications   Medication Sig Start Date End Date Taking? Authorizing Provider  isosorbide mononitrate (IMDUR) 30 MG 24 hr tablet Take by mouth. 08/07/23 08/06/24 Yes [provider]  albuterol (VENTOLIN HFA) 108 (90 Base) MCG/ACT inhaler Inhale 2 puffs into the lungs every 4 (four) hours as needed for shortness of breath or wheezing. 01/11/21   [provider]  Calcium Carbonate-Vit D-Min (CALCIUM 1200) 1200-1000 MG-UNIT CHEW Chew 2 tablets by mouth daily.    [provider]  carvedilol (COREG) 6.25 MG tablet Take 1 tablet (6.25 mg total) by mouth 2 (two) times daily. 04/11/22 08/21/23   Jerald Kief, MD  clotrimazole-betamethasone (LOTRISONE) cream Apply 1 Application topically daily  as needed (for yeast/rash). 11/25/21   [provider]  diclofenac Sodium (VOLTAREN) 1 % GEL Apply 2 g topically in the morning and at bedtime. 12/02/21   [provider]  ferrous sulfate 325 (65 FE) MG tablet Take 325 mg by mouth in the morning and at bedtime.    [provider]  folic acid (FOLVITE) 1 MG tablet Take 1 mg by mouth 2 (two) times daily. 06/02/21   [provider]  furosemide (LASIX) 20 MG tablet Take 20 mg by mouth once a week. 05/27/21   [provider]  gabapentin (NEURONTIN) 600 MG tablet Take 600 mg by mouth 3 (three) times daily as needed (nerve pain). 07/23/21   [provider]  GLOBAL EASE INJECT PEN NEEDLES 32G X 4 MM MISC  08/27/21   [provider]  hydrOXYzine (ATARAX) 25 MG tablet Take 25 mg by mouth daily. 08/17/22   [provider]  insulin aspart (NOVOLOG) 100 UNIT/ML injection 0-20 Units, Subcutaneous, 3 times daily with meal CBG < 70: Implement Hypoglycemia measures CBG 70 - 120: 0 units CBG 121 - 150: 3 units CBG 151 - 200: 4 units CBG 201 - 250: 7 units CBG 251 - 300: 11 units CBG 301 - 350: 15 units CBG 351 - 400: 20 units CBG > 400: call MD 08/31/22   Maretta Bees, MD  ketorolac (ACULAR) 0.5 % ophthalmic solution Place 1 drop into both eyes 4 (four) times daily as needed (dry eyes). 09/06/21   [provider]  lactulose (CHRONULAC) 10 GM/15ML solution Take 15 mLs (10 g total) by mouth 2 (two) times daily. for ammonia levels 08/31/22   Ghimire, Werner Lean, MD  oxyCODONE (OXY IR/ROXICODONE) 5 MG immediate release tablet Take 1 tablet (5 mg total) by mouth every 6 (six) hours as needed for moderate pain (pain score 4-6). 09/13/22   Adonis Huguenin, NP  pantoprazole (PROTONIX) 40 MG tablet Take 40 mg by mouth daily as needed (heartburn). 05/27/21   [provider]  pravastatin (PRAVACHOL) 20  MG tablet Take 1 tablet (20 mg total) by mouth at bedtime. Take after completion of daptomycin 01/03/22   [name], Rebekah Chesterfield, MD  rifaximin (XIFAXAN) 550 MG TABS tablet Take 1 tablet (550 mg total) by mouth 2 (two) times daily. 08/31/22   Ghimire, Werner Lean, MD  sertraline (ZOLOFT) 100 MG tablet Take 2 tablets (200 mg total) by mouth daily. Patient taking differently: Take 150 mg by mouth daily. 06/08/22 08/21/23  Erick Blinks, MD  sodium hypochlorite (DAKIN'S 1/2 STRENGTH) external solution Irrigate with 1 Application as directed daily. 03/08/23   Candelaria Stagers, DPM  spironolactone (ALDACTONE) 50 MG tablet Take 50 mg by mouth daily as needed (swelling). 05/27/21   [provider]  TOUJEO MAX SOLOSTAR 300 UNIT/ML Solostar Pen Inject 20 Units into the skin at bedtime. 08/31/22   Ghimire, Werner Lean, MD  vitamin C (ASCORBIC ACID) 500 MG tablet Take 500 mg by mouth 2 (two) times daily.    [provider]  Vitamin D, Ergocalciferol, (DRISDOL) 1.25 MG (50000 UNIT) CAPS capsule Take 1 capsule by mouth every 7 (seven) days. Mondays 04/05/21   [provider]     CRITICAL CARE Performed by: Lynnell Catalan   Total critical care time: 35 minutes  Critical care time was exclusive of separately billable procedures and treating other patients.  Critical care was necessary to treat or prevent imminent or life-threatening deterioration.  Critical care was time spent personally  by me on the following activities: development of treatment plan with patient and/or surrogate as well as nursing, discussions with consultants, evaluation of patient's response to treatment, examination of patient, obtaining history from patient or surrogate, ordering and performing treatments and interventions, ordering and review of laboratory studies, ordering and review of radiographic studies, pulse oximetry, re-evaluation of patient's condition and participation in multidisciplinary rounds.  Lynnell Catalan, MD  [facility] ICU Physician [facility]  Pager: [phone] Mobile: [phone] After hours: [phone].  08/11/2023, 9:47 PM

## 2023-08-11 NOTE — Evaluation (Signed)
Occupational Therapy Evaluation Patient Details Name: Ariel Arnold MRN: [id number] DOB: November 23, 1956 Today's Date: 08/11/2023   History of Present Illness 67 yo female admitted to [facility] 8/13 with SOB with Rt pleural effusion. 8/14 & 8/16 thoracentesis. 8/19 transfer to [facility]. 8/21 TIPS procedure with post procedure hypotension. Plan for thoracentesis 8/23. PMHx; NASH cirrhosis with ascites, T2DM, HTN, PVD, osteomyelitis, depression, anxiety, Lt BKA   Clinical Impression   PTA, pt lives with daughter and typically able to manage most ADLs from a wheelchair level. Pt presents now with deficits in cognition, cardiopulmonary endurance and strength - requiring increased physical assist than yesterday's therapy session. Pt requiring Max cues and assist to initiate bed mobility, unable to progress OOB today. Pt requiring Mod-Total A for ADLs bed level. Desats to 88% on 8 L O2, RN in to assess and increase supplemental O2. Patient will benefit from continued inpatient follow up therapy, <3 hours/day at DC. Will continue to follow acutely.      If plan is discharge home, recommend the following: A lot of help with walking and/or transfers;A lot of help with bathing/dressing/bathroom    Functional Status Assessment  Patient has had a recent decline in their functional status and demonstrates the ability to make significant improvements in function in a reasonable and predictable amount of time.  Equipment Recommendations  None recommended by OT    Recommendations for Other Services       Precautions / Restrictions Precautions Precautions: Fall;Other (comment) Precaution Comments: art line, Lt BKA Restrictions Weight Bearing Restrictions: Yes LLE Weight Bearing: Non weight bearing Other Position/Activity Restrictions: hx of L BKA      Mobility Bed Mobility Overal bed mobility: Needs Assistance Bed Mobility: Rolling Rolling: Max assist         General bed mobility comments: Max  A for side to side rolling to manage bed pads. Max cues for rolling to back with max tactile cues needed to initiate and complete task. attempted to engage in EOB though pt with poor initaition/command following to assist in lifting trunk.    Transfers                   General transfer comment: deferred      Balance                                           ADL either performed or assessed with clinical judgement   ADL Overall ADL's : Needs assistance/impaired Eating/Feeding: Moderate assistance   Grooming: Moderate assistance;Bed level   Upper Body Bathing: Maximal assistance;Bed level   Lower Body Bathing: Total assistance;Bed level   Upper Body Dressing : Maximal assistance;Bed level   Lower Body Dressing: Total assistance;Bed level       Toileting- Clothing Manipulation and Hygiene: Total assistance;Bed level         General ADL Comments: Limited by lethargy, appeared not feeling well with increasing O2 needs     Vision Ability to See in Adequate Light: 1 Impaired Patient Visual Report: Other (comment) (to be further assessed) Vision Assessment?: No apparent visual deficits Additional Comments: to be further assessed, appears no issues     Perception         Praxis         Pertinent Vitals/Pain Pain Assessment Pain Assessment: No/denies pain     Extremity/Trunk Assessment Upper Extremity Assessment Upper Extremity  Assessment: Generalized weakness   Lower Extremity Assessment Lower Extremity Assessment: Defer to PT evaluation   Cervical / Trunk Assessment Cervical / Trunk Assessment: Kyphotic   Communication Communication Communication: Difficulty following commands/understanding;Difficulty communicating thoughts/reduced clarity of speech Following commands: Follows one step commands inconsistently;Follows one step commands with increased time   Cognition Arousal: Lethargic Behavior During Therapy: Flat  affect Overall Cognitive Status: Impaired/Different from baseline Area of Impairment: Attention, Following commands, Safety/judgement, Awareness, Problem solving, Memory                   Current Attention Level: Sustained Memory: Decreased short-term memory Following Commands: Follows one step commands inconsistently, Follows one step commands with increased time Safety/Judgement: Decreased awareness of safety, Decreased awareness of deficits Awareness: Intellectual Problem Solving: Slow processing, Decreased initiation, Difficulty sequencing, Requires verbal cues, Requires tactile cues General Comments: awake but intermittent lethargy. poor initaition of tasks, command following and memory deficits when inquiring about PLOF     General Comments  On 8 L O2, after bed mobility, desat to 88% with good pleth - RN in to assess and increase O2    Exercises     Shoulder Instructions      Home Living Family/patient expects to be discharged to:: Private residence Living Arrangements: Children Available Help at Discharge: Family;Available PRN/intermittently Type of Home: Apartment Home Access: Level entry     Home Layout: One level     Bathroom Shower/Tub: Chief Strategy Officer: Standard Bathroom Accessibility: Yes   Home Equipment: Agricultural consultant (2 wheels);Grab bars - tub/shower;Toilet riser;Wheelchair - manual;Hospital bed;Shower seat   Additional Comments: hx obtained from PT eval as pt inconsistent with answers today - reported living alone but per chart, lives with daughter      Prior Functioning/Environment Prior Level of Function : Needs assist           ADLs (physical): Bathing Mobility Comments: transfers to [facility] at baseline and performs mobility at [facility] level, had recently received prosthesis and started working with it ADLs Comments: PCA 1x/wk for bathing and some housework. pt reports kitchen not wC accessible but does what she can        OT  Problem List: Decreased strength;Decreased activity tolerance;Impaired balance (sitting and/or standing);Decreased cognition;Decreased safety awareness;Cardiopulmonary status limiting activity      OT Treatment/Interventions: Self-care/ADL training;Therapeutic exercise;Energy conservation;DME and/or AE instruction;Therapeutic activities;Patient/family education    OT Goals(Current goals can be found in the care plan section) Acute Rehab OT Goals Patient Stated Goal: feel better OT Goal Formulation: With patient Time For Goal Achievement: 09/01/23 Potential to Achieve Goals: Fair  OT Frequency: Min 1X/week    Co-evaluation              AM-PAC OT "6 Clicks" Daily Activity     Outcome Measure Help from another person eating meals?: A Lot Help from another person taking care of personal grooming?: A Lot Help from another person toileting, which includes using toliet, bedpan, or urinal?: Total Help from another person bathing (including washing, rinsing, drying)?: A Lot Help from another person to put on and taking off regular upper body clothing?: A Lot Help from another person to put on and taking off regular lower body clothing?: Total 6 Click Score: 10   End of Session Equipment Utilized During Treatment: Oxygen Nurse Communication: Mobility status  Activity Tolerance: Patient limited by fatigue;Patient limited by lethargy Patient left: in bed;with call bell/phone within reach  OT Visit Diagnosis: Unsteadiness on feet (R26.81);Other abnormalities  of gait and mobility (R26.89);Muscle weakness (generalized) (M62.81);Other symptoms and signs involving cognitive function                Time: 1041-1103 OT Time Calculation (min): 22 min Charges:  OT General Charges $OT Visit: 1 Visit OT Evaluation $OT Eval Moderate Complexity: 1 Mod  Bradd Canary, OTR/L Acute Rehab Services Office: [phone]   Lorre Munroe 08/11/2023, 11:16 AM

## 2023-08-12 DIAGNOSIS — K7290 Hepatic failure, unspecified without coma: Secondary | ICD-10-CM | POA: Diagnosis not present

## 2023-08-12 DIAGNOSIS — J9601 Acute respiratory failure with hypoxia: Secondary | ICD-10-CM | POA: Diagnosis not present

## 2023-08-12 DIAGNOSIS — N1831 Chronic kidney disease, stage 3a: Secondary | ICD-10-CM | POA: Diagnosis not present

## 2023-08-12 DIAGNOSIS — K7460 Unspecified cirrhosis of liver: Secondary | ICD-10-CM | POA: Diagnosis not present

## 2023-08-12 LAB — BASIC METABOLIC PANEL
Anion gap: 8 (ref 5–15)
BUN: 33 mg/dL — ABNORMAL HIGH (ref 8–23)
CO2: 26 mmol/L (ref 22–32)
Calcium: 8 mg/dL — ABNORMAL LOW (ref 8.9–10.3)
Chloride: 104 mmol/L (ref 98–111)
Creatinine, Ser: 1.16 mg/dL — ABNORMAL HIGH (ref 0.44–1.00)
GFR, Estimated: 51 mL/min — ABNORMAL LOW (ref >60)
Glucose, Bld: 158 mg/dL — ABNORMAL HIGH (ref 70–99)
Potassium: 3.2 mmol/L — ABNORMAL LOW (ref 3.5–5.1)
Sodium: 138 mmol/L (ref 135–145)

## 2023-08-12 LAB — GLUCOSE, CAPILLARY
Glucose-Capillary: 148 mg/dL — ABNORMAL HIGH (ref 70–99)
Glucose-Capillary: 136 mg/dL — ABNORMAL HIGH (ref 70–99)
Glucose-Capillary: 114 mg/dL — ABNORMAL HIGH (ref 70–99)
Glucose-Capillary: 147 mg/dL — ABNORMAL HIGH (ref 70–99)
Glucose-Capillary: 140 mg/dL — ABNORMAL HIGH (ref 70–99)

## 2023-08-12 LAB — AMMONIA: Ammonia: 31 umol/L (ref 9–35)

## 2023-08-12 LAB — MAGNESIUM: Magnesium: 1.9 mg/dL (ref 1.7–2.4)

## 2023-08-12 MED ORDER — FUROSEMIDE 10 MG/ML IJ SOLN
40.0000 mg | Freq: Once | INTRAMUSCULAR | Status: AC
  Administered 2023-08-12: 40 mg via INTRAVENOUS
  Filled 2023-08-12: qty 4

## 2023-08-12 MED ORDER — POTASSIUM CHLORIDE CRYS ER 20 MEQ PO TBCR
40.0000 meq | EXTENDED_RELEASE_TABLET | ORAL | Status: AC
  Administered 2023-08-12 (×2): 40 meq via ORAL
  Filled 2023-08-12 (×2): qty 2

## 2023-08-12 MED ORDER — MAGNESIUM SULFATE 2 GM/50ML IV SOLN
2.0000 g | Freq: Once | INTRAVENOUS | Status: AC
  Administered 2023-08-12: 2 g via INTRAVENOUS
  Filled 2023-08-12: qty 50

## 2023-08-12 MED ORDER — OXYCODONE HCL 5 MG PO TABS
5.0000 mg | ORAL_TABLET | Freq: Four times a day (QID) | ORAL | Status: DC | PRN
  Administered 2023-08-12 – 2023-08-16 (×2): 5 mg via ORAL
  Filled 2023-08-12 (×2): qty 1

## 2023-08-12 NOTE — Progress Notes (Addendum)
NAME:  Ariel Arnold, MRN:  [id number], DOB:  [birth year], LOS: 5 ADMISSION DATE:  08/07/2023, CONSULTATION DATE:  08/09/23 REFERRING MD:  TRH, CHIEF COMPLAINT:  Post-op hypoxia and hypotension    History of Present Illness:  Ariel Arnold is a 67 year old female with a PMHx of NASH cirrhosis complicated by portal hypertension, recurrent GI bleeding, gastric varices, T2DM, HTN, HLD, PVD, depression, and anxiety who presented to the [facility] ED on 8/13 with shortness of breath. She was found to have a right pleural effusion and a new supplemental oxygen requirement. She underwent thoracentesis x2 (8/14 & 8/16) for concern of hepatic hydrothorax. She was transferred to [facility] 9/19 and underwent TIPS with IR on 8/21. She developed postprocedure hypotension requiring pressor support and hypoxia requiring BiPAP, prompting transfer to ICU.   Pertinent  Medical History  NASH cirrhosis with ascites, gastric varices  T2DM HTN PVD  Depression, Anxiety  Significant Hospital Events: Including procedures, antibiotic start and stop dates in addition to other pertinent events   8/13 presented to [facility] with complaints of shortness of breath 8/14 underwent thoracentesis [facility] 8/16 underwent repeat thoracentesis at [facility] 8/19 transferred to [facility] for interventional radiology evaluation of TIPS procedure 8/21 underwent TIPS with interventional radiology, postprocedure patient was seen hypotensive and hypoxic resulting in initiation of pressors and PCCM consult 8/22 weaned to room air  8/23 Increased O2 needs 8/24 bp stable holding midodrine so this was stopped. POCUS showed mod right effusion added lasix. Aline removed. Stable for Progressive   Interim History / Subjective:  Less SOB. Feels like she can breath easier   Objective   Blood pressure (!) 104/90, pulse 85, temperature 98.1 F (36.7 C), temperature source Oral, resp. rate 13, height 5\' 8"  (1.727 m),  weight 79.9 kg, SpO2 97%.        Intake/Output Summary (Last 24 hours) at 08/12/2023 1323 Last data filed at 08/12/2023 0600 Gross per 24 hour  Intake 240 ml  Output 925 ml  Net -685 ml   Filed Weights   08/09/23 2110 08/10/23 0500 08/12/23 0500  Weight: 80.4 kg 80.4 kg 79.9 kg    Examination: General chronically ill appearing 67 year old female sitting up in bed no distress HENT NCAT no JVD  Pulm crackles on left > right. Decreased on right POCUS chest showed no sig effusion on left. Mod to large right effusion  Card rrr Abd soft Ext warm marked LE edema on right, left AKA Neuro awake, oriented x 2 moves all ext. Affect flat   Resolved Hospital Problem list    Hypotension.  Assessment & Plan:   NASH Cirrhosis  S/p TIPS 8/21 with post-procedure hypotension and respiratory distress, otherwise stable neurologic status.  Plan Cont lactulose and  Rifaximin   Acute Hypoxic Respiratory Failure pulmonary edema and Recurrent Pleural Effusion due to Hepatic hydrothorax S/p thoracentesis x3. Plan Bedside ultrasound today Check BNP Lasix today Repeat am cxr   Ckd CKD Stage 3a - GFR 45 to 59 (Mildly to moderately decreased) Plan  Close obs w/ active diuresis    Fluid and electrolyte imbalance: hypokalemia  Plan Replace and recheck in am   Type 2 Diabetes Mellitus  Plan Glucose goal 140-180 Cont ssi    Hyperlipidemia  Plan Continue statin   Depression  Anxiety  Plan Continue sertraline   Best Practice (right click and "Reselect all SmartList Selections" daily)   Diet/type: Regular DVT prophylaxis: not indicated due to thrombocytopenia  GI  prophylaxis: PPI Lines: N/A Foley:  N/A Code Status:  DNR Last date of multidisciplinary goals of care discussion   08/12/2023, 1:23 PM

## 2023-08-13 ENCOUNTER — Inpatient Hospital Stay (HOSPITAL_COMMUNITY): Payer: Medicare HMO

## 2023-08-13 DIAGNOSIS — J90 Pleural effusion, not elsewhere classified: Secondary | ICD-10-CM | POA: Diagnosis not present

## 2023-08-13 DIAGNOSIS — Z789 Other specified health status: Secondary | ICD-10-CM

## 2023-08-13 DIAGNOSIS — Z515 Encounter for palliative care: Secondary | ICD-10-CM

## 2023-08-13 DIAGNOSIS — Z7189 Other specified counseling: Secondary | ICD-10-CM | POA: Diagnosis not present

## 2023-08-13 DIAGNOSIS — Z66 Do not resuscitate: Secondary | ICD-10-CM | POA: Diagnosis not present

## 2023-08-13 DIAGNOSIS — E1140 Type 2 diabetes mellitus with diabetic neuropathy, unspecified: Secondary | ICD-10-CM | POA: Diagnosis not present

## 2023-08-13 DIAGNOSIS — R531 Weakness: Secondary | ICD-10-CM

## 2023-08-13 DIAGNOSIS — K7290 Hepatic failure, unspecified without coma: Secondary | ICD-10-CM | POA: Diagnosis not present

## 2023-08-13 DIAGNOSIS — I10 Essential (primary) hypertension: Secondary | ICD-10-CM | POA: Diagnosis not present

## 2023-08-13 LAB — BASIC METABOLIC PANEL
Anion gap: 8 (ref 5–15)
BUN: 33 mg/dL — ABNORMAL HIGH (ref 8–23)
CO2: 24 mmol/L (ref 22–32)
Calcium: 8.2 mg/dL — ABNORMAL LOW (ref 8.9–10.3)
Chloride: 107 mmol/L (ref 98–111)
Creatinine, Ser: 1.25 mg/dL — ABNORMAL HIGH (ref 0.44–1.00)
GFR, Estimated: 47 mL/min — ABNORMAL LOW (ref >60)
Glucose, Bld: 162 mg/dL — ABNORMAL HIGH (ref 70–99)
Potassium: 4.1 mmol/L (ref 3.5–5.1)
Sodium: 139 mmol/L (ref 135–145)

## 2023-08-13 LAB — CBC
HCT: 27.6 % — ABNORMAL LOW (ref 36.0–46.0)
Hemoglobin: 8.9 g/dL — ABNORMAL LOW (ref 12.0–15.0)
MCH: 31 pg (ref 26.0–34.0)
MCHC: 32.2 g/dL (ref 30.0–36.0)
MCV: 96.2 fL (ref 80.0–100.0)
Platelets: 87 10*3/uL — ABNORMAL LOW (ref 150–400)
RBC: 2.87 MIL/uL — ABNORMAL LOW (ref 3.87–5.11)
RDW: 15 % (ref 11.5–15.5)
WBC: 5.5 10*3/uL (ref 4.0–10.5)
nRBC: 0 % (ref 0.0–0.2)

## 2023-08-13 LAB — GLUCOSE, CAPILLARY
Glucose-Capillary: 118 mg/dL — ABNORMAL HIGH (ref 70–99)
Glucose-Capillary: 125 mg/dL — ABNORMAL HIGH (ref 70–99)
Glucose-Capillary: 175 mg/dL — ABNORMAL HIGH (ref 70–99)
Glucose-Capillary: 213 mg/dL — ABNORMAL HIGH (ref 70–99)

## 2023-08-13 MED ORDER — MIDODRINE HCL 5 MG PO TABS
5.0000 mg | ORAL_TABLET | Freq: Three times a day (TID) | ORAL | Status: DC
  Administered 2023-08-13 – 2023-08-22 (×26): 5 mg via ORAL
  Filled 2023-08-13 (×26): qty 1

## 2023-08-13 MED ORDER — ZINC OXIDE 40 % EX OINT
TOPICAL_OINTMENT | CUTANEOUS | Status: DC | PRN
  Filled 2023-08-13: qty 57

## 2023-08-13 NOTE — Consult Note (Signed)
Consultation Note Date: 08/13/2023   Patient Name: Ariel Arnold  DOB: 1956-05-19  MRN: [id number]  Age / Sex: 67 y.o., female  PCP: Joaquin Courts, DO Referring Physician: Kathlen Mody, MD  Reason for Consultation: Establishing goals of care  HPI/Patient Profile: 67 y.o. female  with past medical history of NASH cirrhosis with ascites, insulin-dependent diabetes mellitus, depression, anxiety, and right heel ulcer who presented to [facility] on 08/01/2023 with shortness of breath.  Upon arrival to the ED, patient was found to have large right pleural effusion and a new supplemental oxygen requirement. She was admitted to the hospitalist service and underwent therapeutic thoracenteses on 08/02/2023 and again on 08/04/2023.  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  Dr. Archer Asa of IR agreed to see the patient in consultation for consideration of TIPS.  She was transferred to [facility] then [facility] under the hospitalist service for IR evaluation.   08/09/23 she underwent successful ICE-guided TIPS and right thoracentesis and paracentesis; however, had post op hypotension requiring pressor support and BiPap application.  8/22 she was weaned off pressors and back on RA.  8/23 with increased shortness of breath and confusion. Increased lactulose and diuresis 8/24 Mental status improving  Clinical Assessment and Goals of Care: I have reviewed medical records including EPIC notes, labs, and imaging. Worsening creatinine x3 days. Albumin noted chronically low - 1.9 on 08/10/23. Received report from primary RN - no acute concerns.    Went to visit patient at bedside - no family/visitors present. Patient was lying in bed awake, alert, oriented, and able to participate in conversation. No signs or non-verbal gestures of pain or discomfort noted. No respiratory distress, increased work of  breathing, or secretions noted. She is chronically ill and frail appearing. She is on acute 4L O2 [place].  Met with patient  to discuss diagnosis, prognosis, GOC, EOL wishes, disposition, and options.  I introduced Palliative Medicine as specialized medical care for people living with serious illness. It focuses on providing relief from the symptoms and stress of a serious illness. The goal is to improve quality of life for both the patient and the family.  We discussed a brief life review of the patient as well as functional and nutritional status. Patient is widowed - she has two daughters. Prior to hospitalization, she was living in an apartment where for the most part she was independent. She used a wheelchair (has left BKA) - was able to dress/bathe herself, cook, clean, mop the floor. She was supposed to have HHPT work with her on the use of a leg prosthetic. Patient reports her appetite as poor over the last few days - denies nausea.  Patient requests her daughters, Darl Pikes and Aurelio Brash, be present for discussions on GOC and advanced care planning. Therapeutic listening provided as patient reflects over the course of her illness/decline. She reflects on the deaths of her two brothers and friend last year. She is mildly familiar with hospice services as they were utilized during their end of life journey. She is open to ongoing discussions when her daughters can be present.  Answered questions regarding patient's cough. Encouraged incentive spirometry use. Reinforced education on how/when to use it. She achieved 600 ml. Also encouraged nutritional intake as tolerated.  Patient also has questions regarding DME and how her granddaughter might be able to get paid to take care of her - will place [facility] consult.   Offered chaplain services - she is agreeable. She is  of [employer] faith.    All questions and concerns addressed. Encouraged to call with questions and/or concerns. PMT card  provided.  Called daughter/Susan - emotional support provided. Therapeutic listening provided as she reflects on patient's decline. She is hopeful to discuss hospice or other options to support the patient after discharge, indicating "she is definitely going to need help at home." Her two daughters are limited by their own heath issues. Darl Pikes does indicate she wants the patient to make her own decisions, while she's able to. She tells me, "I want what's best for her." She is agreeable to a family meeting with PMT. She will speak to her sister/Joey and call PMT tomorrow 8/26 with preferred time to meet. PMT number provided.   Discussed with patient/family the importance of continued conversation with each other and the medical providers regarding overall plan of care and treatment options, ensuring decisions are within the context of the patient's values and GOCs.     Primary Decision Maker: PATIENT    SUMMARY OF RECOMMENDATIONS   Continue to treat the treatable Continue DNR/DNI as previously documented Patient wishes to discuss full GOC with daughters present. Daughter to call PMT tomorrow 8/26 with preferred meeting time [facility] consulted for: DME needs and Family Caregiver Support Program information per patient's request Chaplain consulted for: emotional support PMT will continue to follow and support holistically   Code Status/Advance Care Planning: DNR  Palliative Prophylaxis:  Aspiration, Bowel Regimen, Frequent Pain Assessment, Oral Care, and Turn Reposition  Additional Recommendations (Limitations, Scope, Preferences): Full Scope Treatment  Psycho-social/Spiritual:  Desire for further Chaplaincy support:yes Created space and opportunity for patient and family to express thoughts and feelings regarding patient's current medical situation.  Emotional support and therapeutic listening provided.  Prognosis:  Unable to determine  Discharge Planning: To Be Determined       Primary Diagnoses: Present on Admission:  Recurrent pleural effusion on right  Acute respiratory failure with hypoxia (HCC)  Type 2 diabetes mellitus with diabetic neuropathy, unspecified (HCC)  Major depressive disorder, single episode, unspecified  Decompensated hepatic cirrhosis (HCC)  Thrombocytopenia (HCC)  Essential hypertension  CKD stage G3a/A3, GFR 45-59 and albumin creatinine ratio >300 mg/g (HCC)  Hypomagnesemia  Mixed hyperlipidemia   I have reviewed the medical record, interviewed the patient and family, and examined the patient. The following aspects are pertinent.  Past Medical History:  Diagnosis Date   Anemia    Anxiety    Aortic atherosclerosis (HCC)    Arthritis    AVM (arteriovenous malformation) of colon    Depression    Essential hypertension    GI bleeding    Recurrent   Hepatic encephalopathy (HCC)    History of kidney stones    History of RSV infection    Liver cirrhosis secondary to NASH (HCC) 2004   Biopsy-proven   Obesity    Osteomyelitis (HCC)    Peripheral vascular disease (HCC)    Thrombocytopenia (HCC)    Type 2 diabetes mellitus (HCC)    Social History   Socioeconomic History   Marital status: Widowed    Spouse name: Not on file   Number of children: Not on file   Years of education: Not on file   Highest education level: Not on file  Occupational History   Not on file  Tobacco Use   Smoking status: Former    Types: Cigarettes   Smokeless tobacco: Never  Vaping Use   Vaping status: Never Used  Substance and Sexual  Activity   Alcohol use: Yes    Comment: wine rare   Drug use: Never   Sexual activity: Not on file  Other Topics Concern   Not on file  Social History Narrative   Not on file   Social Determinants of Health   Financial Resource Strain: Medium Risk (08/03/2023)   Received [facility]   Overall Financial Resource Strain (CARDIA)    Difficulty of Paying Living Expenses: Somewhat hard  Food  Insecurity: No Food Insecurity (08/07/2023)   Hunger Vital Sign    Worried About Running Out of Food in the Last Year: Never true    Ran Out of Food in the Last Year: Never true  Recent Concern: Food Insecurity - Food Insecurity Present (08/03/2023)   Received from [facility]   Hunger Vital Sign    Worried About Running Out of Food in the Last Year: Sometimes true    Ran Out of Food in the Last Year: Sometimes true  Transportation Needs: No Transportation Needs (08/07/2023)   PRAPARE - Administrator, Civil Service (Medical): No    Lack of Transportation (Non-Medical): No  Recent Concern: Transportation Needs - Unmet Transportation Needs (08/03/2023)   Received from [facility] - Transportation    Lack of Transportation (Medical): Yes    Lack of Transportation (Non-Medical): Yes  Physical Activity: Inactive (07/16/2020)   [facility]   Exercise Vital Sign    Days of Exercise per Week: 0 days    Minutes of Exercise per Session: 0 min  Stress: No Stress Concern Present (06/11/2022)   Received from [facility] Occupational Health - Occupational Stress Questionnaire    Feeling of Stress : Only a little  Social Connections: Socially Isolated (08/03/2023)   Received from [facility]   Social Connection and Isolation Panel [NHANES]    Frequency of Communication with Friends and Family: Twice a week    Frequency of Social Gatherings with Friends and Family: Never    Attends Religious Services: 1 to 4 times per year    Active Member of [employer] or Organizations: No    Attends Banker Meetings: Never    Marital Status: Widowed   Family History  Problem Relation Age of Onset   Heart failure Mother    Hyperlipidemia Mother    Diabetes Mellitus II Mother    Heart failure Father    Hyperlipidemia Father    Scheduled Meds:  Chlorhexidine Gluconate Cloth  6 each Topical Daily    diclofenac Sodium  4 g Topical QID   enoxaparin (LOVENOX) injection  40 mg Subcutaneous Q24H   folic acid  1 mg Oral BID   insulin aspart  0-15 Units Subcutaneous TID WC   insulin aspart  0-5 Units Subcutaneous QHS   insulin glargine-yfgn  20 Units Subcutaneous QHS   lactulose  20 g Oral TID   mouth rinse  15 mL Mouth Rinse 4 times per day   pantoprazole  40 mg Oral Daily   pravastatin  20 mg Oral QHS   rifaximin  550 mg Oral BID   sertraline  150 mg Oral Daily   Continuous Infusions:  sodium chloride     PRN Meds:.albuterol, gabapentin, hydrOXYzine, iohexol, liver oil-zinc oxide, ondansetron **OR** ondansetron (ZOFRAN) IV, mouth rinse, oxyCODONE Medications Prior to Admission:  Prior to Admission medications   Medication Sig Start Date  End Date Taking? Authorizing Provider  albuterol (VENTOLIN HFA) 108 (90 Base) MCG/ACT inhaler Inhale 2 puffs into the lungs 2 (two) times daily as needed for shortness of breath or wheezing. 01/11/21   [provider]  Calcium Carbonate-Vit D-Min (CALCIUM 1200) 1200-1000 MG-UNIT CHEW Chew 2 tablets by mouth daily.    [provider]  carvedilol (COREG) 6.25 MG tablet Take 1 tablet (6.25 mg total) by mouth 2 (two) times daily. 04/11/22 08/21/23  Jerald Kief, MD  clotrimazole-betamethasone (LOTRISONE) cream Apply 1 Application topically daily as needed (for yeast/rash). 11/25/21   [provider]  diclofenac Sodium (VOLTAREN) 1 % GEL Apply 2 g topically in the morning and at bedtime. 12/02/21   [provider]  ferrous sulfate 325 (65 FE) MG tablet Take 325 mg by mouth in the morning and at bedtime.    [provider]  folic acid (FOLVITE) 1 MG tablet Take 1 mg by mouth 2 (two) times daily. 06/02/21   [provider]  furosemide (LASIX) 20 MG tablet Take 20 mg by mouth once a week. 05/27/21   [provider]  gabapentin (NEURONTIN) 600 MG tablet Take 600 mg by mouth 3 (three) times daily as needed  (nerve pain). 07/23/21   [provider]  hydrOXYzine (ATARAX) 25 MG tablet Take 25 mg by mouth daily. 08/17/22   [provider]  insulin aspart (NOVOLOG) 100 UNIT/ML injection 0-20 Units, Subcutaneous, 3 times daily with meal CBG < 70: Implement Hypoglycemia measures CBG 70 - 120: 0 units CBG 121 - 150: 3 units CBG 151 - 200: 4 units CBG 201 - 250: 7 units CBG 251 - 300: 11 units CBG 301 - 350: 15 units CBG 351 - 400: 20 units CBG > 400: call MD 08/31/22   Maretta Bees, MD  isosorbide mononitrate (IMDUR) 30 MG 24 hr tablet Take by mouth. 08/07/23 08/06/24  [provider]  ketorolac (ACULAR) 0.5 % ophthalmic solution Place 1 drop into both eyes 4 (four) times daily as needed (dry eyes). 09/06/21   [provider]  lactulose (CHRONULAC) 10 GM/15ML solution Take 15 mLs (10 g total) by mouth 2 (two) times daily. for ammonia levels 08/31/22   Ghimire, Werner Lean, MD  oxyCODONE (OXY IR/ROXICODONE) 5 MG immediate release tablet Take 1 tablet (5 mg total) by mouth every 6 (six) hours as needed for moderate pain (pain score 4-6). 09/13/22   Adonis Huguenin, NP  pantoprazole (PROTONIX) 40 MG tablet Take 40 mg by mouth daily as needed (heartburn). 05/27/21   [provider]  pravastatin (PRAVACHOL) 20 MG tablet Take 1 tablet (20 mg total) by mouth at bedtime. Take after completion of daptomycin 01/03/22   [name], Rebekah Chesterfield, MD  rifaximin (XIFAXAN) 550 MG TABS tablet Take 1 tablet (550 mg total) by mouth 2 (two) times daily. 08/31/22   Ghimire, Werner Lean, MD  sertraline (ZOLOFT) 100 MG tablet Take 2 tablets (200 mg total) by mouth daily. Patient taking differently: Take 150 mg by mouth daily. 06/08/22 08/21/23  Erick Blinks, MD  sodium hypochlorite (DAKIN'S 1/2 STRENGTH) external solution Irrigate with 1 Application as directed daily. 03/08/23   Candelaria Stagers, DPM  spironolactone (ALDACTONE) 50 MG tablet Take 50 mg by mouth daily as needed (swelling). 05/27/21   [provider]  TOUJEO MAX SOLOSTAR 300 UNIT/ML Solostar Pen Inject 20 Units into the skin at bedtime. 08/31/22   Ghimire, Werner Lean, MD  vitamin C (ASCORBIC ACID) 500 MG tablet  Take 500 mg by mouth 2 (two) times daily.    [provider]  Vitamin D, Ergocalciferol, (DRISDOL) 1.25 MG (50000 UNIT) CAPS capsule Take 1 capsule by mouth every 7 (seven) days. Mondays 04/05/21   [provider]   Allergies  Allergen Reactions   Tape Rash   Review of Systems  Constitutional:  Positive for activity change, appetite change and fatigue.  Respiratory:  Negative for shortness of breath.   Gastrointestinal:  Negative for nausea and vomiting.  Neurological:  Positive for weakness.  All other systems reviewed and are negative.   Physical Exam Vitals and nursing note reviewed.  Constitutional:      General: She is not in acute distress.    Appearance: She is cachectic. She is ill-appearing.  Pulmonary:     Effort: No respiratory distress.  Musculoskeletal:     Comments: Generalized weakness  Skin:    General: Skin is warm and dry.  Neurological:     Mental Status: She is alert and oriented to person, place, and time.  Psychiatric:        Attention and Perception: Attention normal.        Behavior: Behavior is cooperative.        Cognition and Memory: Cognition and memory normal.     Vital Signs: BP (!) 108/49 (BP Location: Right Arm)   Pulse 76   Temp 98.5 F (36.9 C) (Oral)   Resp 17   Ht 5\' 8"  (1.727 m)   Wt 81.4 kg   SpO2 97%   BMI 27.29 kg/m  Pain Scale: 0-10 POSS *See Group Information*: 1-Acceptable,Awake and alert Pain Score: 4    SpO2: SpO2: 97 % O2 Device:SpO2: 97 % O2 Flow Rate: .O2 Flow Rate (L/min): 4 L/min  IO: Intake/output summary:  Intake/Output Summary (Last 24 hours) at 08/13/2023 1325 Last data filed at 08/13/2023 0900 Gross per 24 hour  Intake 138.34 ml  Output 1000 ml  Net -861.66 ml    LBM: Last BM Date : 08/13/23 Baseline Weight:  Weight: 82.6 kg Most recent weight: Weight: 81.4 kg     Palliative Assessment/Data: PPS 20%     Time In: 1325 Time Out: 1455 Time Total: 90 minutes  Greater than 50%  of this time was spent counseling and coordinating care related to the above assessment and plan.  Signed by: Haskel Khan, NP   Please contact Palliative Medicine Team phone at [phone] for questions and concerns.  For individual provider: See Amion  *Portions of this note are a verbal dictation therefore any spelling and/or grammatical errors are due to the "Dragon Medical One" system interpretation.

## 2023-08-13 NOTE — Plan of Care (Signed)
  Problem: Education: Goal: Knowledge of General Education information will improve Description: Including pain rating scale, medication(s)/side effects and non-pharmacologic comfort measures Outcome: Progressing   Problem: Health Behavior/Discharge Planning: Goal: Ability to manage health-related needs will improve Outcome: Progressing   Problem: Clinical Measurements: Goal: Ability to maintain clinical measurements within normal limits will improve Outcome: Progressing Goal: Will remain free from infection Outcome: Progressing Goal: Diagnostic test results will improve Outcome: Progressing Goal: Respiratory complications will improve Outcome: Progressing Goal: Cardiovascular complication will be avoided Outcome: Progressing   Problem: Activity: Goal: Risk for activity intolerance will decrease Outcome: Progressing   Problem: Nutrition: Goal: Adequate nutrition will be maintained Outcome: Progressing   Problem: Coping: Goal: Level of anxiety will decrease Outcome: Progressing   Problem: Elimination: Goal: Will not experience complications related to bowel motility Outcome: Progressing Goal: Will not experience complications related to urinary retention Outcome: Progressing   Problem: Pain Managment: Goal: General experience of comfort will improve Outcome: Progressing   Problem: Safety: Goal: Ability to remain free from injury will improve Outcome: Progressing   Problem: Skin Integrity: Goal: Risk for impaired skin integrity will decrease Outcome: Progressing   Problem: Education: Goal: Ability to describe self-care measures that may prevent or decrease complications (Diabetes Survival Skills Education) will improve Outcome: Progressing Goal: Individualized Educational Video(s) Outcome: Progressing   Problem: Coping: Goal: Ability to adjust to condition or change in health will improve Outcome: Progressing   Problem: Fluid Volume: Goal: Ability to  maintain a balanced intake and output will improve Outcome: Progressing   Problem: Health Behavior/Discharge Planning: Goal: Ability to identify and utilize available resources and services will improve Outcome: Progressing Goal: Ability to manage health-related needs will improve Outcome: Progressing   Problem: Metabolic: Goal: Ability to maintain appropriate glucose levels will improve Outcome: Progressing   Problem: Nutritional: Goal: Maintenance of adequate nutrition will improve Outcome: Progressing Goal: Progress toward achieving an optimal weight will improve Outcome: Progressing   Problem: Skin Integrity: Goal: Risk for impaired skin integrity will decrease Outcome: Progressing   Problem: Tissue Perfusion: Goal: Adequacy of tissue perfusion will improve Outcome: Progressing   Problem: Education: Goal: Understanding of CV disease, CV risk reduction, and recovery process will improve Outcome: Progressing Goal: Individualized Educational Video(s) Outcome: Progressing   Problem: Activity: Goal: Ability to return to baseline activity level will improve Outcome: Progressing   Problem: Cardiovascular: Goal: Ability to achieve and maintain adequate cardiovascular perfusion will improve Outcome: Progressing Goal: Vascular access site(s) Level 0-1 will be maintained Outcome: Progressing   Problem: Health Behavior/Discharge Planning: Goal: Ability to safely manage health-related needs after discharge will improve Outcome: Progressing   Chassity Ludke Tamera Stands, RN

## 2023-08-13 NOTE — Progress Notes (Addendum)
[facility]                                                                               Ariel Arnold, is a 67 y.o. female, DOB - 10/29/1956, ZOX:[id number] Admit date - 08/07/2023    Outpatient Primary MD for the patient is Joaquin Courts, DO  LOS - 6  days    Brief summary   Taken from H&P.  Ariel Arnold is a pleasant 67 y.o. female with medical history significant for NASH cirrhosis with ascites, insulin-dependent diabetes mellitus, depression, anxiety, and right heel ulcer who presented to [facility] on 08/01/2023 with shortness of breath. Patient had first paracentesis approximately a week prior to this presentation.  [facility] Course: Upon arrival to the ED, patient was found to have large right pleural effusion and a new supplemental oxygen requirement.  She was admitted to the hospitalist service and underwent therapeutic thoracenteses on 08/02/2023 and again on 08/04/2023.  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  Dr. Archer Asa of IR agreed to see the patient in consultation for consideration of TIPS.  She was transferred to [facility] under hospitalist for IR evaluation.  SHE underwent TIPS on procedure on 8/21 with postprocedure hypotension and respiratory distress requiring transfer to ICU for vasopressors.  She was transferred back to [facility] on 08/13/2023        Assessment & Plan    Assessment and Plan:  Acute hypoxic respiratory failure secondary to recurrent pleural effusion due to hepatic hydrothorax and pulmonary edema S/p thoracentesis x 3 Chest x-ray showed Pulmonary edema and right pleural effusion,. Patient is on room air at this time PCCM following    NASH cirrhosis S/p TIPS on 08/09/2023 with postprocedure hypotension and respiratory distress Continue with lactulose and rifaximin   Stage IIIa CKD Creatinine at 1.2 from 1.1 yesterday after dose of IV Lasix. Continue to monitor creatinine while on IV  Lasix.    Hypokalemia Replaced Magnesium level within normal limits   Insulin-dependent diabetes mellitus Continue with Semglee and sliding scale insulin CBG (last 3)  Recent Labs    08/12/23 1825 08/12/23 2031 08/13/23 0810  GLUCAP 147* 140* 118*   Poor oral intake. Continue with gabapentin for diabetic neuropathy   Anemia of chronic disease From cirrhosis   Chronic thrombocytopenia From cirrhosis, platelets at 87,000. Continue to monitor    Depression/anxiety Continue with sertraline.    Hyperlipidemia Continue with Pravachol   RN Pressure Injury Documentation: Pressure Injury 08/07/23 Sacrum Right;Left;Mid Stage 2 -  Partial thickness loss of dermis presenting as a shallow open injury with a red, pink wound bed without slough. (Active)  08/07/23 1507  Location: Sacrum  Location Orientation: Right;Left;Mid  Staging: Stage 2 -  Partial thickness loss of dermis presenting as a shallow open injury with a red, pink wound bed without slough.  Wound Description (Comments):   Present on Admission: Yes  Dressing Type Foam - Lift dressing to assess site every shift 08/12/23 1945     Pressure Injury 08/07/23 Ankle Right;Medial Stage 2 -  Partial thickness loss of dermis presenting as a shallow open injury with a red, pink wound bed without slough. (Active)  08/07/23 1507  Location: Ankle  Location Orientation: Right;Medial  Staging: Stage 2 -  Partial thickness loss of dermis presenting as a shallow open injury with a red, pink wound bed without slough.  Wound Description (Comments):   Present on Admission: Yes  Dressing Type None 08/12/23 1945     Estimated body mass index is 27.29 kg/m as calculated from the following:   Height as of this encounter: 5\' 8"  (1.727 m).   Weight as of this encounter: 81.4 kg.  Code Status: DNR.  DVT Prophylaxis:  enoxaparin (LOVENOX) injection 40 mg Start: 08/10/23 1015 SCDs Start: 08/07/23 1524   Level of Care: Level of  care: Med-Surg Family Communication: None at bedside  Procedures:  TIPS  PCCM IR  Antimicrobials:   Anti-infectives (From admission, onward)    Start     Dose/Rate Route Frequency Ordered Stop   08/10/23 1000  rifaximin (XIFAXAN) tablet 550 mg        550 mg Oral 2 times daily 08/10/23 0916     08/09/23 1515  cefTRIAXone (ROCEPHIN) 2 g in sodium chloride 0.9 % 100 mL IVPB        2 g 200 mL/hr over 30 Minutes Intravenous  Once 08/09/23 1507 08/09/23 1536   08/09/23 1330  cefTRIAXone (ROCEPHIN) 1 g in sodium chloride 0.9 % 100 mL IVPB  Status:  Discontinued        1 g 200 mL/hr over 30 Minutes Intravenous To Radiology 08/09/23 1316 08/09/23 1506        Medications  Scheduled Meds:  Chlorhexidine Gluconate Cloth  6 each Topical Daily   diclofenac Sodium  4 g Topical QID   enoxaparin (LOVENOX) injection  40 mg Subcutaneous Q24H   folic acid  1 mg Oral BID   insulin aspart  0-15 Units Subcutaneous TID WC   insulin aspart  0-5 Units Subcutaneous QHS   insulin aspart  3 Units Subcutaneous TID WC   insulin glargine-yfgn  20 Units Subcutaneous QHS   lactulose  20 g Oral TID   mouth rinse  15 mL Mouth Rinse 4 times per day   pantoprazole  40 mg Oral Daily   pravastatin  20 mg Oral QHS   rifaximin  550 mg Oral BID   sertraline  150 mg Oral Daily   Continuous Infusions:  sodium chloride     PRN Meds:.albuterol, gabapentin, hydrOXYzine, iohexol, ondansetron **OR** ondansetron (ZOFRAN) IV, mouth rinse, oxyCODONE    Subjective:   Ariel Arnold was seen and examined today.  Poor oral intake as per RN, patient currently denies any chest pain shortness of breath nausea or vomiting or abdominal pain at this time.  Objective:   Vitals:   08/12/23 1755 08/12/23 1942 08/13/23 0452 08/13/23 0808  BP: (!) 101/41 (!) 106/42 (!) 101/42 (!) 108/49  Pulse: 80 81 74 76  Resp: 17 16 16 17   Temp: 98.2 F (36.8 C) 98.1 F (36.7 C) 98.5 F (36.9 C) 98.5 F (36.9 C)  TempSrc: Oral  Oral Oral Oral  SpO2: 96% 98% 98% 97%  Weight:   81.4 kg   Height:        Intake/Output Summary (Last 24 hours) at 08/13/2023 1108 Last data filed at 08/13/2023 0900 Gross per 24 hour  Intake 170.07 ml  Output 1015 ml  Net -844.93 ml   Filed Weights   08/10/23 0500 08/12/23 0500 08/13/23 0452  Weight: 80.4 kg 79.9 kg 81.4 kg     Exam General exam: Frail ill-appearing  lady not in any kind of distress Respiratory system: Diminished air entry at bases, on room air Cardiovascular system: S1 & S2 heard, RRR. No JVD,  Gastrointestinal system: Abdomen is soft, mildly distended bowel sounds normal Central nervous system: Alert and oriented. No focal neurological deficits. Extremities: left BKA. Skin: No rashes Psychiatry: Flat affect   Data Reviewed:  I have personally reviewed following labs and imaging studies   CBC Lab Results  Component Value Date   WBC 5.5 08/13/2023   RBC 2.87 (L) 08/13/2023   HGB 8.9 (L) 08/13/2023   HCT 27.6 (L) 08/13/2023   MCV 96.2 08/13/2023   MCH 31.0 08/13/2023   PLT 87 (L) 08/13/2023   MCHC 32.2 08/13/2023   RDW 15.0 08/13/2023   LYMPHSABS 1.1 02/02/2023   MONOABS 0.2 02/02/2023   EOSABS 0.1 02/02/2023   BASOSABS 0.0 02/02/2023     Last metabolic panel Lab Results  Component Value Date   NA 139 08/13/2023   K 4.1 08/13/2023   CL 107 08/13/2023   CO2 24 08/13/2023   BUN 33 (H) 08/13/2023   CREATININE 1.25 (H) 08/13/2023   GLUCOSE 162 (H) 08/13/2023   GFRNONAA 47 (L) 08/13/2023   CALCIUM 8.2 (L) 08/13/2023   PHOS 3.7 08/10/2023   PROT 4.8 (L) 08/10/2023   ALBUMIN 1.9 (L) 08/10/2023   BILITOT 2.7 (H) 08/10/2023   ALKPHOS 60 08/10/2023   AST 23 08/10/2023   ALT 15 08/10/2023   ANIONGAP 8 08/13/2023    CBG (last 3)  Recent Labs    08/12/23 1825 08/12/23 2031 08/13/23 0810  GLUCAP 147* 140* 118*      Coagulation Profile: Recent Labs  Lab 08/08/23 0309 08/09/23 0111  INR 1.5* 1.5*     Radiology Studies: DG  Chest Port 1 View  Result Date: 08/13/2023 CLINICAL DATA:  Pleural effusion EXAM: PORTABLE CHEST 1 VIEW COMPARISON:  Two days ago FINDINGS: Cardiomegaly. Diffuse interstitial opacity with sizable right pleural effusion. No pneumothorax. Embolization coils in the left upper quadrant and stent over the right upper quadrant. IMPRESSION: Pulmonary edema and right pleural effusion, similar to prior. Electronically Signed   By: Tiburcio Pea M.D.   On: 08/13/2023 08:13       Kathlen Mody M.D. [facility] 08/13/2023, 11:08 AM  Available via Epic secure chat 7am-7pm After 7 pm, please refer to night coverage provider listed on amion.

## 2023-08-13 NOTE — Plan of Care (Signed)
  Problem: Education: Goal: Knowledge of General Education information will improve Description: Including pain rating scale, medication(s)/side effects and non-pharmacologic comfort measures Outcome: Progressing   Problem: Health Behavior/Discharge Planning: Goal: Ability to manage health-related needs will improve Outcome: Progressing   Problem: Clinical Measurements: Goal: Ability to maintain clinical measurements within normal limits will improve Outcome: Progressing Goal: Diagnostic test results will improve Outcome: Progressing Goal: Respiratory complications will improve Outcome: Progressing Goal: Cardiovascular complication will be avoided Outcome: Progressing   Problem: Activity: Goal: Risk for activity intolerance will decrease Outcome: Progressing   Problem: Nutrition: Goal: Adequate nutrition will be maintained Outcome: Progressing   Problem: Coping: Goal: Level of anxiety will decrease Outcome: Progressing   Problem: Elimination: Goal: Will not experience complications related to bowel motility Outcome: Progressing Goal: Will not experience complications related to urinary retention Outcome: Progressing   Problem: Pain Managment: Goal: General experience of comfort will improve Outcome: Progressing   Problem: Safety: Goal: Ability to remain free from injury will improve Outcome: Progressing   Problem: Skin Integrity: Goal: Risk for impaired skin integrity will decrease Outcome: Progressing   Problem: Fluid Volume: Goal: Ability to maintain a balanced intake and output will improve Outcome: Progressing   Problem: Health Behavior/Discharge Planning: Goal: Ability to identify and utilize available resources and services will improve Outcome: Progressing Goal: Ability to manage health-related needs will improve Outcome: Progressing   Problem: Tissue Perfusion: Goal: Adequacy of tissue perfusion will improve Outcome: Progressing

## 2023-08-14 ENCOUNTER — Inpatient Hospital Stay (HOSPITAL_COMMUNITY): Payer: Medicare HMO

## 2023-08-14 DIAGNOSIS — I10 Essential (primary) hypertension: Secondary | ICD-10-CM | POA: Diagnosis not present

## 2023-08-14 DIAGNOSIS — J90 Pleural effusion, not elsewhere classified: Secondary | ICD-10-CM | POA: Diagnosis not present

## 2023-08-14 DIAGNOSIS — K7290 Hepatic failure, unspecified without coma: Secondary | ICD-10-CM | POA: Diagnosis not present

## 2023-08-14 DIAGNOSIS — N1831 Chronic kidney disease, stage 3a: Secondary | ICD-10-CM | POA: Diagnosis not present

## 2023-08-14 DIAGNOSIS — R531 Weakness: Secondary | ICD-10-CM | POA: Diagnosis not present

## 2023-08-14 DIAGNOSIS — E1140 Type 2 diabetes mellitus with diabetic neuropathy, unspecified: Secondary | ICD-10-CM | POA: Diagnosis not present

## 2023-08-14 DIAGNOSIS — Z515 Encounter for palliative care: Secondary | ICD-10-CM | POA: Diagnosis not present

## 2023-08-14 LAB — CBC WITH DIFFERENTIAL/PLATELET
Abs Immature Granulocytes: 0.01 10*3/uL (ref 0.00–0.07)
Basophils Absolute: 0 10*3/uL (ref 0.0–0.1)
Basophils Relative: 0 %
Eosinophils Absolute: 0.3 10*3/uL (ref 0.0–0.5)
Eosinophils Relative: 9 %
HCT: 22.7 % — ABNORMAL LOW (ref 36.0–46.0)
Hemoglobin: 7.4 g/dL — ABNORMAL LOW (ref 12.0–15.0)
Immature Granulocytes: 0 %
Lymphocytes Relative: 32 %
Lymphs Abs: 1 10*3/uL (ref 0.7–4.0)
MCH: 31.6 pg (ref 26.0–34.0)
MCHC: 32.6 g/dL (ref 30.0–36.0)
MCV: 97 fL (ref 80.0–100.0)
Monocytes Absolute: 0.3 10*3/uL (ref 0.1–1.0)
Monocytes Relative: 9 %
Neutro Abs: 1.6 10*3/uL — ABNORMAL LOW (ref 1.7–7.7)
Neutrophils Relative %: 50 %
Platelets: 60 10*3/uL — ABNORMAL LOW (ref 150–400)
RBC: 2.34 MIL/uL — ABNORMAL LOW (ref 3.87–5.11)
RDW: 15 % (ref 11.5–15.5)
WBC: 3.2 10*3/uL — ABNORMAL LOW (ref 4.0–10.5)
nRBC: 0.6 % — ABNORMAL HIGH (ref 0.0–0.2)

## 2023-08-14 LAB — COMPREHENSIVE METABOLIC PANEL
ALT: 15 U/L (ref 0–44)
AST: 28 U/L (ref 15–41)
Albumin: 2.4 g/dL — ABNORMAL LOW (ref 3.5–5.0)
Alkaline Phosphatase: 56 U/L (ref 38–126)
Anion gap: 5 (ref 5–15)
BUN: 35 mg/dL — ABNORMAL HIGH (ref 8–23)
CO2: 28 mmol/L (ref 22–32)
Calcium: 8.1 mg/dL — ABNORMAL LOW (ref 8.9–10.3)
Chloride: 106 mmol/L (ref 98–111)
Creatinine, Ser: 1.17 mg/dL — ABNORMAL HIGH (ref 0.44–1.00)
GFR, Estimated: 51 mL/min — ABNORMAL LOW (ref >60)
Glucose, Bld: 138 mg/dL — ABNORMAL HIGH (ref 70–99)
Potassium: 3.4 mmol/L — ABNORMAL LOW (ref 3.5–5.1)
Sodium: 139 mmol/L (ref 135–145)
Total Bilirubin: 1.9 mg/dL — ABNORMAL HIGH (ref 0.3–1.2)
Total Protein: 4.9 g/dL — ABNORMAL LOW (ref 6.5–8.1)

## 2023-08-14 LAB — GLUCOSE, CAPILLARY
Glucose-Capillary: 114 mg/dL — ABNORMAL HIGH (ref 70–99)
Glucose-Capillary: 128 mg/dL — ABNORMAL HIGH (ref 70–99)
Glucose-Capillary: 137 mg/dL — ABNORMAL HIGH (ref 70–99)
Glucose-Capillary: 168 mg/dL — ABNORMAL HIGH (ref 70–99)
Glucose-Capillary: 169 mg/dL — ABNORMAL HIGH (ref 70–99)
Glucose-Capillary: 179 mg/dL — ABNORMAL HIGH (ref 70–99)

## 2023-08-14 MED ORDER — INSULIN ASPART 100 UNIT/ML IJ SOLN
0.0000 [IU] | Freq: Three times a day (TID) | INTRAMUSCULAR | Status: DC
  Administered 2023-08-14: 2 [IU] via SUBCUTANEOUS
  Administered 2023-08-14: 3 [IU] via SUBCUTANEOUS
  Administered 2023-08-15 (×3): 2 [IU] via SUBCUTANEOUS
  Administered 2023-08-16: 3 [IU] via SUBCUTANEOUS
  Administered 2023-08-17: 2 [IU] via SUBCUTANEOUS
  Administered 2023-08-17: 3 [IU] via SUBCUTANEOUS
  Administered 2023-08-17: 2 [IU] via SUBCUTANEOUS
  Administered 2023-08-18: 5 [IU] via SUBCUTANEOUS
  Administered 2023-08-18: 2 [IU] via SUBCUTANEOUS
  Administered 2023-08-19: 5 [IU] via SUBCUTANEOUS
  Administered 2023-08-19 – 2023-08-20 (×3): 3 [IU] via SUBCUTANEOUS
  Administered 2023-08-20 – 2023-08-22 (×5): 2 [IU] via SUBCUTANEOUS
  Administered 2023-08-22: 3 [IU] via SUBCUTANEOUS

## 2023-08-14 MED ORDER — GERHARDT'S BUTT CREAM
TOPICAL_CREAM | Freq: Two times a day (BID) | CUTANEOUS | Status: DC
  Administered 2023-08-21: 1 via TOPICAL
  Filled 2023-08-14: qty 1

## 2023-08-14 MED ORDER — POTASSIUM CHLORIDE CRYS ER 20 MEQ PO TBCR
40.0000 meq | EXTENDED_RELEASE_TABLET | Freq: Once | ORAL | Status: AC
  Administered 2023-08-14: 40 meq via ORAL
  Filled 2023-08-14: qty 2

## 2023-08-14 MED ORDER — FUROSEMIDE 20 MG PO TABS
20.0000 mg | ORAL_TABLET | Freq: Every day | ORAL | Status: DC
  Administered 2023-08-14: 20 mg via ORAL
  Filled 2023-08-14: qty 1

## 2023-08-14 NOTE — Care Management Important Message (Signed)
Important Message  Patient Details  Name: Ariel Arnold MRN: [id number] Date of Birth: June 28, 1956   Medicare Important Message Given:  Yes     Sherilyn Banker 08/14/2023, 12:51 PM

## 2023-08-14 NOTE — Plan of Care (Signed)
  Problem: Clinical Measurements: Goal: Respiratory complications will improve Outcome: Progressing   Problem: Activity: Goal: Risk for activity intolerance will decrease Outcome: Progressing   Problem: Coping: Goal: Level of anxiety will decrease Outcome: Progressing   

## 2023-08-14 NOTE — Consult Note (Signed)
WOC Nurse Consult Note: Reason for Consult: Consult requested for right heel and sacrum/buttocks.  Pt has red moist macerated partial thickness skin loss to bilat buttocks and sacrum; appearance is consistent with moisture associated skin damage.  Affected area is approx 3X3X.1cm in patchy areas. Right heel with Stage 2 pressure injury; .2X.2X.1cm, red and dry Pressure Injury POA: Yes Dressing procedure/placement/frequency: Topical treatment orders provided for bedside nurses to perform as follows: Apply Gerhardts butt cream BID and PRN when turning or cleaning.  Float heel in Prevalon boot to reduce pressure.  Foam dressing to protect from further injury. Please re-consult if further assistance is needed.  Thank-you,  Cammie Mcgee MSN, RN, CWOCN, [place], CNS [phone]

## 2023-08-14 NOTE — Progress Notes (Signed)
[facility]                                                                               Ariel Arnold, is a 67 y.o. female, DOB - Mar 07, 1956, FIE:[id number] Admit date - 08/07/2023    Outpatient Primary MD for the patient is Ariel Courts, DO  LOS - 7  days    Brief summary   Taken from H&P.  Ariel Arnold is a pleasant 67 y.o. female with medical history significant for NASH cirrhosis with ascites, insulin-dependent diabetes mellitus, depression, anxiety, and right heel ulcer who presented to [facility] on 08/01/2023 with shortness of breath. Patient had first paracentesis approximately a week prior to this presentation.  [facility] Hospital Course: Upon arrival to the ED, patient was found to have large right pleural effusion and a new supplemental oxygen requirement.  She was admitted to the hospitalist service and underwent therapeutic thoracenteses on 08/02/2023 and again on 08/04/2023.  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  Dr. Archer Asa of IR agreed to see the patient in consultation for consideration of TIPS.  She was transferred to [facility] under hospitalist for IR evaluation.  SHE underwent TIPS on procedure on 8/21 with postprocedure hypotension and respiratory distress requiring transfer to ICU for vasopressors.  She was transferred back to [facility] on 08/13/2023        Assessment & Plan    Assessment and Plan:  Acute hypoxic respiratory failure secondary to recurrent pleural effusion due to hepatic hydrothorax and pulmonary edema S/p thoracentesis x 3 Dyspneic last night and this am, requiring 3  to 4 lit/min of oxygen.  CXR unchanged.   NASH cirrhosis S/p TIPS on 08/09/2023 with postprocedure hypotension and respiratory distress Continue with lactulose and rifaximin   Stage IIIa CKD Creatinine stable at 1.1 Continue with oral lasix 20 mg daily.     Hypokalemia Replaced Magnesium level within normal limits. Recheck labs  in am.    Insulin-dependent diabetes mellitus Continue with Semglee and sliding scale insulin CBG (last 3)  Recent Labs    08/14/23 0808 08/14/23 1138 08/14/23 1551  GLUCAP 114* 137* 168*   Poor oral intake. Continue with gabapentin for diabetic neuropathy   Anemia of chronic disease From cirrhosis. Hemoglobin dropped to 7.4.  Transfuse to keep hemoglobin greater than 7.     Chronic thrombocytopenia From cirrhosis, platelets worsened to 60,000.  Continue to monitor    Depression/anxiety Continue with sertraline.    Hyperlipidemia Continue with Pravachol   RN Pressure Injury Documentation: Pressure Injury 08/07/23 Sacrum Right;Left;Mid Stage 2 -  Partial thickness loss of dermis presenting as a shallow open injury with a red, pink wound bed without slough. (Active)  08/07/23 1507  Location: Sacrum  Location Orientation: Right;Left;Mid  Staging: Stage 2 -  Partial thickness loss of dermis presenting as a shallow open injury with a red, pink wound bed without slough.  Wound Description (Comments):   Present on Admission: Yes  Dressing Type Other (Comment) 08/14/23 0816     Pressure Injury 08/07/23 Heel Right Stage 2 -  Partial thickness loss of dermis presenting as a shallow open injury with a red, pink wound  bed without slough. (Active)  08/07/23 1507  Location: Heel  Location Orientation: Right  Staging: Stage 2 -  Partial thickness loss of dermis presenting as a shallow open injury with a red, pink wound bed without slough.  Wound Description (Comments):   Present on Admission: Yes  Dressing Type Foam - Lift dressing to assess site every shift 08/13/23 2113     Estimated body mass index is 27.29 kg/m as calculated from the following:   Height as of this encounter: 5\' 8"  (1.727 m).   Weight as of this encounter: 81.4 kg.  Code Status: DNR.  DVT Prophylaxis:  enoxaparin (LOVENOX) injection 40 mg Start: 08/10/23 1015 SCDs Start: 08/07/23 1524   Level  of Care: Level of care: Med-Surg Family Communication: None at bedside  Procedures:  TIPS  PCCM IR  Antimicrobials:   Anti-infectives (From admission, onward)    Start     Dose/Rate Route Frequency Ordered Stop   08/10/23 1000  rifaximin (XIFAXAN) tablet 550 mg        550 mg Oral 2 times daily 08/10/23 0916     08/09/23 1515  cefTRIAXone (ROCEPHIN) 2 g in sodium chloride 0.9 % 100 mL IVPB        2 g 200 mL/hr over 30 Minutes Intravenous  Once 08/09/23 1507 08/09/23 1536   08/09/23 1330  cefTRIAXone (ROCEPHIN) 1 g in sodium chloride 0.9 % 100 mL IVPB  Status:  Discontinued        1 g 200 mL/hr over 30 Minutes Intravenous To Radiology 08/09/23 1316 08/09/23 1506        Medications  Scheduled Meds:  Chlorhexidine Gluconate Cloth  6 each Topical Daily   diclofenac Sodium  4 g Topical QID   enoxaparin (LOVENOX) injection  40 mg Subcutaneous Q24H   folic acid  1 mg Oral BID   furosemide  20 mg Oral Daily   Gerhardt's butt cream   Topical BID   insulin aspart  0-15 Units Subcutaneous TID WC   insulin aspart  0-5 Units Subcutaneous QHS   insulin glargine-yfgn  20 Units Subcutaneous QHS   lactulose  20 g Oral TID   midodrine  5 mg Oral TID WC   mouth rinse  15 mL Mouth Rinse 4 times per day   pantoprazole  40 mg Oral Daily   pravastatin  20 mg Oral QHS   rifaximin  550 mg Oral BID   sertraline  150 mg Oral Daily   Continuous Infusions:  sodium chloride     PRN Meds:.albuterol, gabapentin, hydrOXYzine, iohexol, ondansetron **OR** ondansetron (ZOFRAN) IV, mouth rinse, oxyCODONE    Subjective:   Ariel Arnold was seen and examined today.  Still dyspneic.   Objective:   Vitals:   08/13/23 2020 08/14/23 0433 08/14/23 0806 08/14/23 1551  BP: (!) 113/44 (!) 104/44 (!) 108/45 100/79  Pulse: 87 82 79 83  Resp: 18 16 16 16   Temp: 98.7 F (37.1 C) 97.9 F (36.6 C) 98.2 F (36.8 C) 98.1 F (36.7 C)  TempSrc: Oral Oral Oral   SpO2: 97% 99% 100% 100%  Weight:       Height:        Intake/Output Summary (Last 24 hours) at 08/14/2023 1814 Last data filed at 08/14/2023 1616 Gross per 24 hour  Intake 720 ml  Output 500 ml  Net 220 ml   Filed Weights   08/10/23 0500 08/12/23 0500 08/13/23 0452  Weight: 80.4 kg 79.9 kg 81.4 kg  Exam General exam: Appears calm and comfortable  Respiratory system: Clear to auscultation. Respiratory effort normal. Cardiovascular system: S1 & S2 heard, RRR.  Gastrointestinal system: Abdomen is soft ,distended, bs+ Central nervous system: Alert and oriented.  Extremities: . Skin: No rashes,  Psychiatry: mood is appropriate.   Data Reviewed:  I have personally reviewed following labs and imaging studies   CBC Lab Results  Component Value Date   WBC 3.2 (L) 08/14/2023   RBC 2.34 (L) 08/14/2023   HGB 7.4 (L) 08/14/2023   HCT 22.7 (L) 08/14/2023   MCV 97.0 08/14/2023   MCH 31.6 08/14/2023   PLT 60 (L) 08/14/2023   MCHC 32.6 08/14/2023   RDW 15.0 08/14/2023   LYMPHSABS 1.0 08/14/2023   MONOABS 0.3 08/14/2023   EOSABS 0.3 08/14/2023   BASOSABS 0.0 08/14/2023     Last metabolic panel Lab Results  Component Value Date   NA 139 08/14/2023   K 3.4 (L) 08/14/2023   CL 106 08/14/2023   CO2 28 08/14/2023   BUN 35 (H) 08/14/2023   CREATININE 1.17 (H) 08/14/2023   GLUCOSE 138 (H) 08/14/2023   GFRNONAA 51 (L) 08/14/2023   CALCIUM 8.1 (L) 08/14/2023   PHOS 3.7 08/10/2023   PROT 4.9 (L) 08/14/2023   ALBUMIN 2.4 (L) 08/14/2023   BILITOT 1.9 (H) 08/14/2023   ALKPHOS 56 08/14/2023   AST 28 08/14/2023   ALT 15 08/14/2023   ANIONGAP 5 08/14/2023    CBG (last 3)  Recent Labs    08/14/23 0808 08/14/23 1138 08/14/23 1551  GLUCAP 114* 137* 168*      Coagulation Profile: Recent Labs  Lab 08/08/23 0309 08/09/23 0111  INR 1.5* 1.5*     Radiology Studies: DG CHEST PORT 1 VIEW  Result Date: 08/14/2023 CLINICAL DATA:  [id number] Dyspnea [id number] EXAM: PORTABLE CHEST 1 VIEW COMPARISON:  Chest XR,  08/13/2023 and 08/11/2023. CT chest, 08/01/2023. FINDINGS: Cardiomediastinal silhouette is unchanged, with the apex silhouetted by patchy basilar opacities. Aortic arch atherosclerosis Aortic Atherosclerosis (ICD10-I70.0). Hypoinflation with persistent hazy opacification of the RIGHT lung. Perihilar and septal thickening within the LEFT chest. Small volume RIGHT pleural effusion, no LEFT pleural effusion or pneumothorax. TIPS stent at the RIGHT upper quadrant and embolization coils at the LEFT upper quadrant. No interval osseous abnormality. IMPRESSION: Unchanged pulmonary findings RIGHT-greater-than-LEFT pulmonary edema, and small volume RIGHT pleural effusion. Electronically Signed   By: Roanna Banning M.D.   On: 08/14/2023 12:41   DG Chest Port 1 View  Result Date: 08/13/2023 CLINICAL DATA:  Pleural effusion EXAM: PORTABLE CHEST 1 VIEW COMPARISON:  Two days ago FINDINGS: Cardiomegaly. Diffuse interstitial opacity with sizable right pleural effusion. No pneumothorax. Embolization coils in the left upper quadrant and stent over the right upper quadrant. IMPRESSION: Pulmonary edema and right pleural effusion, similar to prior. Electronically Signed   By: Tiburcio Pea M.D.   On: 08/13/2023 08:13       Kathlen Mody M.D. [facility] 08/14/2023, 6:14 PM  Available via Epic secure chat 7am-7pm After 7 pm, please refer to night coverage provider listed on amion.

## 2023-08-14 NOTE — Progress Notes (Signed)
Chaplain attempted to respond to [facility] consult for support. Upon arrival, PT was entering the pt's room to complete therapy. Our team will continue to follow. Please do not hesitate to page if pt would like a visit prior to return attempt.  Maryanna Shape. Carley Hammed, M.Div. [facility] Chaplain Pager [phone] Office [phone]

## 2023-08-14 NOTE — Progress Notes (Signed)
Patient ID: CHEVON MARIER, female   DOB: [birth year], 67 y.o.   MRN: [id number]    Progress Note from the Palliative Medicine Team at [facility]   Patient Name: Ariel Arnold        Date: 08/14/2023 DOB: 02/20/1956  Age: 67 y.o. MRN#: [id number] Attending Physician: Kathlen Mody, MD Primary Care Physician: Joaquin Courts, DO Admit Date: 08/07/2023   Reason for Consultation/Follow-up   Establishing Goals of  Care    HPI/ Brief Hospital Review  67 y.o. female  with past medical history of NASH cirrhosis with ascites, insulin-dependent diabetes mellitus, depression, anxiety, and right heel ulcer who presented to [facility] on 08/01/2023 with shortness of breath.  Upon arrival to the ED, patient was found to have large right pleural effusion and a new supplemental oxygen requirement. She was admitted to the hospitalist service and underwent therapeutic thoracenteses on 08/02/2023 and again on 08/04/2023.  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  Dr. Archer Asa of IR agreed to see the patient in consultation for consideration of TIPS.  She was transferred to [facility] then [facility] under the hospitalist service for IR evaluation.    08/09/23 she underwent successful ICE-guided TIPS and right thoracentesis and paracentesis; however, had post op hypotension requiring pressor support and BiPap application.  8/22 she was weaned off pressors and back on RA.  8/23 with increased shortness of breath and confusion. Increased lactulose and diuresis 8/24 Mental status improving   Subjective  Extensive chart review has been completed prior to meeting with patient/family  including labs, vital signs, imaging, progress/consult notes, orders, medications and available advance directive documents.    This NP assessed patient at the bedside as a follow up for palliative medicine needs and emotional support and to met with daughter to discus GOCs  Patient's  daughters Darl Pikes and Ander Slade participated in today's meeting.   Education offered on current medical situation specific to her liver disease, DM, vascular disease and her overall failure to thrive.  All understand the seriousness of her current situation, anticipatory care needs are a great concern for th family.  We explored human mortality and adult failure to thrive and the limits of medical interventions to prolong quality of life when a body fails to thrive.  Education offered on hospice benefit; philosophy and eligibility.       One certainty of patient and  her family is that when medically stable she wishes to discharge home, NO desire for SNF for rehab.  Hospice is being considered.  Education offered today regarding  the importance of continued conversation with family and their  medical providers regarding overall plan of care and treatment options,  ensuring decisions are within the context of the patients values and GOCs.  Encouraged documentation of HPOA and ACP wishes.  PMT will f/u with patient as she and her family make decisions regarding advanced directives and anticipatory care needs.  Questions and concerns addressed   Discussed with primary team and nursing staff   Time:    75  minutes  Detailed review of medical records ( labs, imaging, vital signs), medically appropriate exam ( MS, skin, cardia,  resp)   discussed with treatment team, counseling and education to patient, family, staff, documenting clinical information, medication management, coordination of care    Lorinda Creed NP  Palliative Medicine Team Team Phone # [phone] Pager [phone]

## 2023-08-14 NOTE — Progress Notes (Signed)
Physical Therapy Treatment Patient Details Name: Ariel Arnold MRN: [id number] DOB: [birth year] Today's Date: 08/14/2023   History of Present Illness 67 yo female admitted to [facility] 8/13 with SOB with Rt pleural effusion. 8/14 & 8/16 thoracentesis. 8/19 transfer to [facility]. 8/21 TIPS procedure with post procedure hypotension. Plan for thoracentesis 8/23. PMHx; NASH cirrhosis with ascites, T2DM, HTN, PVD, osteomyelitis, depression, anxiety, Lt BKA    PT Comments  Pt supine in bed, family in room. Pt relates GoC meeting later today. Asked if she would like to be up sitting in chair for conversation. Pt agreeable. Pt moving well today able to come to EoB with supervision. Introduced Ariel Arnold and pt able to stand with min Ax2 and transfer over to chair. Asked pt about prosthetic, reports she had just started working with it but could have family bring for future session. D/c plan remains appropriate at this time.    If plan is discharge home, recommend the following: A lot of help with walking and/or transfers;A lot of help with bathing/dressing/bathroom;Assistance with cooking/housework;Assist for transportation   Can travel by private vehicle     No  Equipment Recommendations  None recommended by PT       Precautions / Restrictions Precautions Precautions: Fall;Other (comment) Precaution Comments: Lt BKA Restrictions Weight Bearing Restrictions: Yes LLE Weight Bearing: Non weight bearing Other Position/Activity Restrictions: hx of L BKA     Mobility  Bed Mobility Overal bed mobility: Needs Assistance Bed Mobility: Supine to Sit     Supine to sit: HOB elevated, Supervision     General bed mobility comments: supervision for safety vc for sequencing to scoot herself to the EoB    Transfers Overall transfer level: Needs assistance Equipment used: Ambulation equipment used Transfers: Sit to/from Stand, Bed to chair/wheelchair/BSC Sit to Stand: Min assist, +2 physical  assistance           General transfer comment: pt able to initiate power up to standing, vc for upright posture and bring her hips forward for placement/removal of Stedy pads    Ambulation/Gait               General Gait Details: unable at baseline         Balance Overall balance assessment: Needs assistance   Sitting balance-Leahy Scale: Fair     Standing balance support: Bilateral upper extremity supported Standing balance-Leahy Scale: Poor Standing balance comment: min assist and UB support in standing                            Cognition Arousal: Alert Behavior During Therapy: Flat affect Overall Cognitive Status: Impaired/Different from baseline Area of Impairment: Attention, Following commands, Safety/judgement, Awareness, Problem solving, Memory                     Memory: Decreased short-term memory Following Commands: Follows multi-step commands consistently, Follows multi-step commands inconsistently Safety/Judgement: Decreased awareness of safety, Decreased awareness of deficits Awareness: Anticipatory Problem Solving: Slow processing, Decreased initiation, Difficulty sequencing, Requires verbal cues, Requires tactile cues General Comments: awake and able to participate in therapy today,           General Comments General comments (skin integrity, edema, etc.): VSS on 3L O2 via [place]      Pertinent Vitals/Pain Pain Assessment Pain Assessment: No/denies pain     PT Goals (current goals can now be found in the care plan section) Acute Rehab PT Goals Patient  Stated Goal: return home PT Goal Formulation: With patient Time For Goal Achievement: 08/24/23 Potential to Achieve Goals: Fair Progress towards PT goals: Progressing toward goals    Frequency    Min 1X/week       AM-PAC PT "6 Clicks" Mobility   Outcome Measure  Help needed turning from your back to your side while in a flat bed without using bedrails?: A  Little Help needed moving from lying on your back to sitting on the side of a flat bed without using bedrails?: A Little Help needed moving to and from a bed to a chair (including a wheelchair)?: A Lot Help needed standing up from a chair using your arms (e.g., wheelchair or bedside chair)?: A Little Help needed to walk in hospital room?: Total Help needed climbing 3-5 steps with a railing? : Total 6 Click Score: 13    End of Session Equipment Utilized During Treatment: Oxygen Activity Tolerance: Patient tolerated treatment well Patient left: in chair;with call bell/phone within reach;with chair alarm set;with nursing/sitter in room Nurse Communication: Mobility status PT Visit Diagnosis: Other abnormalities of gait and mobility (R26.89);Muscle weakness (generalized) (M62.81)     Time: 8295-6213 PT Time Calculation (min) (ACUTE ONLY): 22 min  Charges:    $Therapeutic Activity: 8-22 mins PT General Charges $$ ACUTE PT VISIT: 1 Visit                     Ariel Arnold B. Beverely Risen PT, DPT Acute Rehabilitation Services Please use secure chat or  Call Office [phone]    [facility] 08/14/2023, 3:15 PM

## 2023-08-15 ENCOUNTER — Encounter (INDEPENDENT_AMBULATORY_CARE_PROVIDER_SITE_OTHER): Payer: Self-pay | Admitting: *Deleted

## 2023-08-15 ENCOUNTER — Inpatient Hospital Stay (HOSPITAL_COMMUNITY): Payer: Medicare HMO

## 2023-08-15 DIAGNOSIS — K7460 Unspecified cirrhosis of liver: Secondary | ICD-10-CM | POA: Diagnosis not present

## 2023-08-15 DIAGNOSIS — Z515 Encounter for palliative care: Secondary | ICD-10-CM | POA: Diagnosis not present

## 2023-08-15 DIAGNOSIS — E1140 Type 2 diabetes mellitus with diabetic neuropathy, unspecified: Secondary | ICD-10-CM | POA: Diagnosis not present

## 2023-08-15 DIAGNOSIS — J90 Pleural effusion, not elsewhere classified: Secondary | ICD-10-CM | POA: Diagnosis not present

## 2023-08-15 DIAGNOSIS — N1831 Chronic kidney disease, stage 3a: Secondary | ICD-10-CM | POA: Diagnosis not present

## 2023-08-15 DIAGNOSIS — J810 Acute pulmonary edema: Secondary | ICD-10-CM | POA: Diagnosis not present

## 2023-08-15 DIAGNOSIS — K7290 Hepatic failure, unspecified without coma: Secondary | ICD-10-CM | POA: Diagnosis not present

## 2023-08-15 DIAGNOSIS — I10 Essential (primary) hypertension: Secondary | ICD-10-CM | POA: Diagnosis not present

## 2023-08-15 DIAGNOSIS — J9601 Acute respiratory failure with hypoxia: Secondary | ICD-10-CM | POA: Diagnosis not present

## 2023-08-15 DIAGNOSIS — R531 Weakness: Secondary | ICD-10-CM | POA: Diagnosis not present

## 2023-08-15 DIAGNOSIS — K7581 Nonalcoholic steatohepatitis (NASH): Secondary | ICD-10-CM | POA: Diagnosis not present

## 2023-08-15 LAB — BASIC METABOLIC PANEL
Anion gap: 7 (ref 5–15)
BUN: 28 mg/dL — ABNORMAL HIGH (ref 8–23)
CO2: 26 mmol/L (ref 22–32)
Calcium: 8 mg/dL — ABNORMAL LOW (ref 8.9–10.3)
Chloride: 106 mmol/L (ref 98–111)
Creatinine, Ser: 1.04 mg/dL — ABNORMAL HIGH (ref 0.44–1.00)
GFR, Estimated: 58 mL/min — ABNORMAL LOW (ref >60)
Glucose, Bld: 165 mg/dL — ABNORMAL HIGH (ref 70–99)
Potassium: 4 mmol/L (ref 3.5–5.1)
Sodium: 139 mmol/L (ref 135–145)

## 2023-08-15 LAB — CBC WITH DIFFERENTIAL/PLATELET
Abs Immature Granulocytes: 0.01 10*3/uL (ref 0.00–0.07)
Basophils Absolute: 0 10*3/uL (ref 0.0–0.1)
Basophils Relative: 1 %
Eosinophils Absolute: 0.3 10*3/uL (ref 0.0–0.5)
Eosinophils Relative: 8 %
HCT: 25.4 % — ABNORMAL LOW (ref 36.0–46.0)
Hemoglobin: 8.2 g/dL — ABNORMAL LOW (ref 12.0–15.0)
Immature Granulocytes: 0 %
Lymphocytes Relative: 29 %
Lymphs Abs: 1 10*3/uL (ref 0.7–4.0)
MCH: 32 pg (ref 26.0–34.0)
MCHC: 32.3 g/dL (ref 30.0–36.0)
MCV: 99.2 fL (ref 80.0–100.0)
Monocytes Absolute: 0.3 10*3/uL (ref 0.1–1.0)
Monocytes Relative: 9 %
Neutro Abs: 1.8 10*3/uL (ref 1.7–7.7)
Neutrophils Relative %: 53 %
Platelets: 63 10*3/uL — ABNORMAL LOW (ref 150–400)
RBC: 2.56 MIL/uL — ABNORMAL LOW (ref 3.87–5.11)
RDW: 15.3 % (ref 11.5–15.5)
WBC: 3.3 10*3/uL — ABNORMAL LOW (ref 4.0–10.5)
nRBC: 0 % (ref 0.0–0.2)

## 2023-08-15 LAB — GLUCOSE, CAPILLARY
Glucose-Capillary: 131 mg/dL — ABNORMAL HIGH (ref 70–99)
Glucose-Capillary: 139 mg/dL — ABNORMAL HIGH (ref 70–99)
Glucose-Capillary: 144 mg/dL — ABNORMAL HIGH (ref 70–99)
Glucose-Capillary: 152 mg/dL — ABNORMAL HIGH (ref 70–99)
Glucose-Capillary: 152 mg/dL — ABNORMAL HIGH (ref 70–99)
Glucose-Capillary: 207 mg/dL — ABNORMAL HIGH (ref 70–99)

## 2023-08-15 LAB — BRAIN NATRIURETIC PEPTIDE: B Natriuretic Peptide: 353.4 pg/mL — ABNORMAL HIGH (ref 0.0–100.0)

## 2023-08-15 MED ORDER — ALBUMIN HUMAN 5 % IV SOLN
25.0000 g | Freq: Once | INTRAVENOUS | Status: AC
  Administered 2023-08-15: 25 g via INTRAVENOUS
  Filled 2023-08-15: qty 500

## 2023-08-15 MED ORDER — FUROSEMIDE 20 MG PO TABS: 20.0000 mg | ORAL_TABLET | Freq: Two times a day (BID) | ORAL | Status: DC

## 2023-08-15 MED ORDER — SPIRONOLACTONE 25 MG PO TABS
25.0000 mg | ORAL_TABLET | Freq: Every day | ORAL | Status: DC
  Administered 2023-08-15 – 2023-08-21 (×7): 25 mg via ORAL
  Filled 2023-08-15 (×7): qty 1

## 2023-08-15 MED ORDER — SODIUM CHLORIDE 0.9 % IV SOLN
2.0000 g | Freq: Once | INTRAVENOUS | Status: AC
  Administered 2023-08-16: 2 g via INTRAVENOUS
  Filled 2023-08-15: qty 20

## 2023-08-15 MED ORDER — FUROSEMIDE 10 MG/ML IJ SOLN
40.0000 mg | Freq: Once | INTRAMUSCULAR | Status: AC
  Administered 2023-08-15: 40 mg via INTRAVENOUS
  Filled 2023-08-15: qty 4

## 2023-08-15 NOTE — Progress Notes (Signed)
Patient ID: Ariel Arnold, female   DOB: 1956/08/14, 67 y.o.   MRN: [id number]    Progress Note from the Palliative Medicine Team at [facility]   Patient Name: Ariel Arnold        Date: 08/15/2023 DOB: 11-19-1956  Age: 67 y.o. MRN#: [id number] Attending Physician: Kathlen Mody, MD Primary Care Physician: Joaquin Courts, DO Admit Date: 08/07/2023   Reason for Consultation/Follow-up   Establishing Goals of  Care   HPI/ Brief Hospital Review  67 y.o. female  with past medical history of NASH cirrhosis with ascites, insulin-dependent diabetes mellitus, depression, anxiety, and right heel ulcer who presented to [facility] on 08/01/2023 with shortness of breath.  Upon arrival to the ED, patient was found to have large right pleural effusion and a new supplemental oxygen requirement. She was admitted to the hospitalist service and underwent therapeutic thoracenteses on 08/02/2023 and again on 08/04/2023.  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  Dr. Archer Asa of IR agreed to see the patient in consultation for consideration of TIPS.  She was transferred to [facility] then [facility] under the hospitalist service for IR evaluation.    08/09/23 she underwent successful ICE-guided TIPS and right thoracentesis and paracentesis; however, had post op hypotension requiring pressor support and BiPap application.  8/22 she was weaned off pressors and back on RA.  8/23 with increased shortness of breath and confusion. Increased lactulose and diuresis 8/24 Mental status improving 8-25 creasing pulmonary edema   Subjective  Extensive chart review has been completed prior to meeting with patient/family  including labs, vital signs, imaging, progress/consult notes, orders, medications and available advance directive documents.    This NP assessed patient at the bedside as a follow up for palliative medicine needs and emotional support, family at  bedside   Continued  education offered on current medical situation specific to her liver disease, DM, vascular disease and her overall failure to thrive.    I shared with Ariel Arnold my concern that she will continue to have fluid buildup in her lungs requiring thoracentesis.  Education offered and we revisited  hospice benefit; philosophy and eligibility.   Patient tells me today that she is not interested in hospice services.  She wishes to discharge home with home health and have her family take care of her at home.  I was able to speak to her daughter Ariel Arnold by phone.  Understandably family have concerns regarding the increasing care needs for patient at home.  Ariel Arnold will speak to her other family members and patient again encouraging consideration of hospice.       Education offered today regarding  the importance of continued conversation with family and their  medical providers regarding overall plan of care and treatment options,  ensuring decisions are within the context of the patients values and GOCs.  Again  encouraged documentation of HPOA and ACP wishes.  Questions and concerns addressed   Discussed with primary team and nursing staff   Time:    50  minutes  Detailed review of medical records ( labs, imaging, vital signs), medically appropriate exam ( MS, skin, cardia,  resp)   discussed with treatment team, counseling and education to patient, family, staff, documenting clinical information, medication management, coordination of care    Lorinda Creed NP  Palliative Medicine Team Team Phone # [phone] Pager [phone]

## 2023-08-15 NOTE — TOC Initial Note (Addendum)
Transition of Care (TOC) - Initial/Assessment Note   Spoke to patient at bedside and daughter Darl Pikes via speaker phone. Darl Pikes lives with patient and can assist with dressing changes etc. Patient states she is active with [facility] already . NCM will call and see with orders they need. Spoke to [place] at [place] patient is active with HHRN,PT and aide they can add OT. She is requesting orders and face to face and clinicals be faxed to her at [phone].   Patient has wheelchair, bedside commode , rollator and hospital bed at home .    Patient wants hospital bed mattress replaced . NCM explained will need to go through company she received bed from. Patient thinks it was [employer]. NCM called Abby at [facility] , they provided wheelchair, commode and Rollator not the bed. Patient and daughter aware. NCM explained there should be a sticker on bed with company name and phone number. Daughter not at home right now so cannot check. She will when she gets home. NCM explained she can call company directly. Daughter voiced understanding. Patient wanted NCM to call [employer] and see if bed came from them. NCM called Zach with [employer] he will check and let NCM know. Await call back. Ian Malkin checked she does not have a hospital bed with [employer] . Patient aware   Patient currently on oxygen in hospital, does not have home oxygen. Will need pulse saturation note and order if home oxygen needed . MD consulting PCCM   Per Palliative , patient's daughter wants information on becoming a paid caregiver for her mother. Asked HH SW to provide information for [place] program.   Fax sent to [facility]  Patient Details  Name: Ariel Arnold MRN: [id number] Date of Birth: May 19, 1956  Transition of Care [facility]) CM/SW Contact:    Kingsley Plan, RN Phone Number: 08/15/2023, 10:13 AM  Clinical Narrative:                   Expected Discharge Plan: Home w Home Health Services Barriers to Discharge:  Continued Medical Work up   Patient Goals and CMS Choice Patient states their goals for this hospitalization and ongoing recovery are:: to return to home CMS Medicare.gov Compare Post Acute Care list provided to:: Patient Choice offered to / list presented to : Patient [place] ownership interest in [facility].provided to:: Patient    Expected Discharge Plan and Services   Discharge Planning Services: CM Consult Post Acute Care Choice: Home Health Living arrangements for the past 2 months: Apartment                 DME Arranged:  (see note)         HH Arranged: RN, PT          Prior Living Arrangements/Services Living arrangements for the past 2 months: Apartment Lives with:: Adult Children Patient language and need for interpreter reviewed:: Yes Do you feel safe going back to the place where you live?: Yes      Need for Family Participation in Patient Care: Yes (Comment) Care giver support system in place?: Yes (comment) Current home services: DME Criminal Activity/Legal Involvement Pertinent to Current Situation/Hospitalization: No - Comment as needed  Activities of Daily Living Home Assistive Devices/Equipment: Bedside commode/3-in-1, Wheelchair ADL Screening (condition at time of admission) Patient's cognitive ability adequate to safely complete daily activities?: Yes Is the patient deaf or have difficulty hearing?: No Does the patient have difficulty seeing,  even when wearing glasses/contacts?: No Does the patient have difficulty concentrating, remembering, or making decisions?: No Patient able to express need for assistance with ADLs?: Yes Does the patient have difficulty dressing or bathing?: Yes Independently performs ADLs?: No Communication: Independent Dressing (OT): Needs assistance Is this a change from baseline?: Pre-admission baseline Grooming: Needs assistance Is this a change from baseline?: Pre-admission baseline Feeding:  Independent Bathing: Needs assistance Is this a change from baseline?: Pre-admission baseline Toileting: Needs assistance Is this a change from baseline?: Pre-admission baseline In/Out Bed: Needs assistance Is this a change from baseline?: Pre-admission baseline Walks in Home: Needs assistance Is this a change from baseline?: Pre-admission baseline Does the patient have difficulty walking or climbing stairs?: Yes Weakness of Legs: Both Weakness of Arms/Hands: Both  Permission Sought/Granted   Permission granted to share information with : Yes, Verbal Permission Granted  Share Information with NAME: [name] [phone]  Permission granted to share info w AGENCY: [facility]        Emotional Assessment Appearance:: Appears stated age Attitude/Demeanor/Rapport: Engaged Affect (typically observed): Accepting Orientation: : Oriented to Self, Oriented to Place, Oriented to  Time, Oriented to Situation Alcohol / Substance Use: Not Applicable Psych Involvement: No (comment)  Admission diagnosis:  Decompensated hepatic cirrhosis (HCC) [K72.90, K74.60] Patient Active Problem List   Diagnosis Date Noted   Pressure injury of skin 08/08/2023   Recurrent pleural effusion on right 08/07/2023   Acute respiratory failure with hypoxia (HCC) 08/07/2023   Decompensated hepatic cirrhosis (HCC) 08/07/2023   CKD stage G3a/A3, GFR 45-59 and albumin creatinine ratio >300 mg/g (HCC) 01/24/2023   Vitamin D deficiency 01/24/2023   Diabetic osteomyelitis (HCC)    MRSA bacteremia 06/06/2022   Secondary esophageal varices with bleeding (HCC)    Cellulitis of left foot 06/02/2022   Ascites 04/05/2022   Cellulitis 04/02/2022   Lactic acidosis 04/02/2022   Chronic osteomyelitis involving ankle and foot, left (HCC) 11/09/2021   Essential hypertension 11/09/2021   Hematemesis    Liver cirrhosis secondary to NASH (HCC) 05/19/2021   Thrombocytopenia (HCC) 05/19/2021   Obesity (BMI 30-39.9) 02/15/2021    Acquired absence of left foot (HCC) 01/11/2021   Non-healing wound of amputation stump (HCC) 09/09/2020   Coronary atherosclerosis 08/04/2020   Non-prs chronic ulcer oth prt right foot w fat layer exposed (HCC) 12/24/2019   Acquired absence of other left toe(s) (HCC) 11/19/2019   COVID-19 08/20/2019   Difficulty in walking, not elsewhere classified 08/09/2019   History of peptic ulcer disease 08/09/2019   Muscle weakness (generalized) 08/09/2019   Pain in left ankle and joints of left foot 08/09/2019   Unsteadiness on feet 08/09/2019   Gastro-esophageal reflux disease without esophagitis 08/08/2019   History of urinary stone 08/08/2019   Hypomagnesemia 08/08/2019   Long term (current) use of insulin (HCC) 08/08/2019   Major depressive disorder, single episode, unspecified 08/08/2019   Peripheral vascular disease (HCC) 08/08/2019   Type 2 diabetes mellitus with diabetic neuropathy, unspecified (HCC) 08/08/2019   Stage 2 chronic kidney disease due to type 2 diabetes mellitus (HCC) 08/01/2019   Nonalcoholic fatty liver disease 05/07/2019   Type 2 diabetes mellitus treated with insulin (HCC) 05/06/2019   Depressive disorder 04/18/2019   Generalized anxiety disorder 04/18/2019   Seasonal allergic rhinitis 04/18/2019   Arthritis 04/18/2019   Mixed hyperlipidemia 12/19/2018   PCP:  Joaquin Courts, DO Pharmacy:   [facility] [phone] - [place], [place] - [address] [place] [address] Phone: [phone]  Fax: [phone]     Social Determinants of Health (SDOH) Social History: SDOH Screenings   Food Insecurity: No Food Insecurity (08/07/2023)  Recent Concern: Food Insecurity - Food Insecurity Present (08/03/2023)   Received from [facility]  Housing: Low Risk  (08/07/2023)  Transportation Needs: No Transportation Needs (08/07/2023)  Recent Concern: Transportation Needs - Unmet Transportation Needs (08/03/2023)   Received from [facility]   Utilities: Not At Risk (08/07/2023)  Financial Resource Strain: Medium Risk (08/03/2023)   Received from [facility]  Physical Activity: Inactive (07/16/2020)   Received from [facility], [facility]  Social Connections: Socially Isolated (08/03/2023)   Received from [facility]  Stress: No Stress Concern Present (06/11/2022)   Received from [facility]  Tobacco Use: Medium Risk (08/09/2023)  Health Literacy: Medium Risk (08/03/2023)   Received from [facility]   SDOH Interventions:     Readmission Risk Interventions    11/16/2021    9:27 AM  Readmission Risk Prevention Plan  Home Care Screening Complete  Medication Review (RN CM) Complete

## 2023-08-15 NOTE — Consult Note (Addendum)
Attending physician's note   I have taken a history, reviewed the chart, and examined the patient. I performed a substantive portion of this encounter, including complete performance of at least one of the key components, in conjunction with the APP. I agree with the APP's note, impression, and recommendations with my edits.   67 year old female with medical history as outlined below, to include history of NASH cirrhosis c/b gastric and esophageal varices, portal hypertension, hepatic encephalopathy, ascites, hepatic hydrothorax, portal colopathy, portal hypertension, along with nonvariceal bleed in 01/2021 (colonoscopy with right sided AVMs treated with APC and clip).  Admitted to [facility] earlier this month with right heel ulcer and diagnosed with hepatic hydrothorax requiring thoracentesis on 8/14, 8/16, then ultimately transferred for TIPS (with repeat thoracentesis and paracentesis) on 8/21.  Initially with improved pulmonary function, but over the last couple of days has had increasing supplemental oxygen requirement.  Serial CXR with small right pleural effusion and pulmonary edema.  Since the large right pleural effusion has significantly improved since TIPS, I am more suspicious of increased right heart return after TIPS with subsequent pulmonary edema as main etiology for her pulmonary symptoms.  Agree that this is best treated with optimizing diuretics and would not place Pleurx cath.  - IR consult with ultrasound and TIPS interrogation with possible downsizing if appropriate - Continue aggressive diuretics as renally tolerated.  Agree with IV Lasix, along with adding spironolactone - Continue midodrine - Continue albumin pushes with Lasix chase - Daily CXR for the time being - Continue lactulose/rifaximin - H/H stable.  No e/o bleeding or other indication for endoscopic evaluation - As above, agree with PCCS avoiding Pleurx cath at this juncture - GI service will continue to  follow  Doristine Locks, DO, FACG [phone] office          Consultation  Referring Provider:   CCM Primary Care Physician:  Joaquin Courts, DO Primary Gastroenterologist:  [facility]       Reason for Consultation: Decompensated NASH cirrhosis with hepatic hydrothorax s/p TIPS  DOA: 08/07/2023         Hospital Day: 9         HPI:   Ariel Arnold is a 67 y.o. female with past medical history significant for insulin-dependent diabetes, L TMA osteomyelitis resulting in L BKA , depression, anxiety, history of [name] cirrhosis with history of GI bleed February 2022 with gastric varices esophageal varices, portal hypertension, hepatic encephalopathy, IDA.  EGD on 02/17/2021 with large gastric varix without stigmata of bleeding, small esophageal varices that extended from the gastric varix without evidence of bleeding. Portal hypertensive gastropathy seen.  Colonoscopy on 02/18/2021 with exam to distal terminal ileum showing no evidence of a mass, large cecal and proximal ascending colon AVM status post epinephrine and cauterization with APC and Endo Clip placement. There was evidence of portal colopathy.  EGD 03/15/2021 secondary coffee-ground emesis ED without active bleeding seen. She was noted to have esophageal varix, gastric varix, gastritis/portal gastropathy seen.  She was placed on coreg 3.125 mg bid for non selective BB.  04/07/2021 last office visit with digestive health, was referred to [facility] for possible endoscopic therapy for gastric varices. Patient was lost to follow-up secondary to husband cancer and following with him.  Patient was admitted to [facility] for right heel ulcer found to have hepatic hydrothorax status post therapeutic thoracentesis 8/14 and 8/16 8/21 status post TIPS and thoracentesis with 2 L pleural  fluid removed, therapeutic paracentesis with 3.9 L ascites removed with postprocedural hypotension and respiratory distress requiring  transfer to ICU and vasopressors. 08/08/2023 showed particle embolization of gastric varices, right paraesophageal varices cirrhosis with hepatosplenomegaly, stigmata of portal venous hypertension, moderate to large right pleural effusion ascites, mild generalized bowel thickening favored consistent portal enteropathy benign renal cyst right no follow-up. Chest x-ray yesterday showed unchanged pulmonary findings right greater than left pulmonary edema and small volume right pleural effusion Ammonia 31, previously 57. Most recently BUN 28 creatinine 1.04 baseline appears to be close to 0.9.   Electrolytes normal.  WBC 3.3, hemoglobin 8.2 up from 7.4 unknown baseline, MCV normal, platelets 63.  No family was present at the time of my evaluation. Patient lying in bed, appears chronically ill, on 4 L 02, patient not normally on oxygen prior to this admission.. Patient states she has not followed up with GI in last 2 years, has had intermittent cirrhosis issues for at least 5 years.  Was well-controlled till probably in the past month has been having abdominal swelling with some increasing shortness of breath. At home she was on Lasix as needed, no spironolactone, was on carvedilol 3.215 mg twice daily. Patient states she has been having diarrhea since increase in lactulose has been dark but denies melena or hematochezia.  Patient is also on iron orally and has been getting IV iron from cancer center last 1 was potentially June to 6 months ago. Patient's had nausea but denies vomiting, denies reflux has been on Protonix 40 mg once daily. Patient's had generalized pruritus, this started 2 years ago after surgery, complains of it still today, no rash. Denies jaundice, dark urine, clay colored stools. Patient had some weight loss with diuretics, denies abdominal pain.   08/09/2023 TIPS MPRESSION: 1. Successful creation of Transjugular Intrahepatic Portosystemic Shunt (TIPS), with a post-TIPS  portosystemic pressure gradient of 4 mmHg. 2. Successful therapeutic RIGHT thoracentesis with drainage of 1.75 L of pleural fluid. 3. Successful therapeutic paracentesis with drainage of 3.9 L of ascites.   Past Medical History:  Diagnosis Date   Anemia    Anxiety    Aortic atherosclerosis (HCC)    Arthritis    AVM (arteriovenous malformation) of colon    Depression    Essential hypertension    GI bleeding    Recurrent   Hepatic encephalopathy (HCC)    History of kidney stones    History of RSV infection    Liver cirrhosis secondary to NASH (HCC) 2004   Biopsy-proven   Obesity    Osteomyelitis (HCC)    Peripheral vascular disease (HCC)    Thrombocytopenia (HCC)    Type 2 diabetes mellitus (HCC)     Surgical History:  She  has a past surgical history that includes Colonoscopy; Cystoscopy with ureteral stent; Esophagogastroduodenoscopy; Abdominal hysterectomy; Toe amputations (Bilateral); Tubal ligation; IR EMBO VENOUS NOT HEMORR HEMANG  INC GUIDE ROADMAPPING (05/21/2021); IR US Guide Vasc Access Right (05/21/2021); IR Venogram Renal Uni Left (05/21/2021); IR Angiogram Selective Each Additional Vessel (05/21/2021); Radiology with anesthesia (N/A, 05/21/2021); IR Angiogram Follow Up Study (05/21/2021); Esophagogastroduodenoscopy (egd) with propofol (N/A, 05/20/2021); Wound debridement (Left, 11/12/2021); Amputation (Left, 04/05/2022); Irrigation and debridement foot (Left, 06/04/2022); Amputation (Left, 08/26/2022); Application if wound vac (Left, 08/26/2022); TIPS procedure (N/A, 08/09/2023); IR Tips (08/09/2023); IR Paracentesis (08/09/2023); IR THORACENTESIS ASP PLEURAL SPACE W/IMG GUIDE (08/09/2023); and IR US Guide Vasc Access Right (08/09/2023). Family History:  Her family history includes Diabetes Mellitus II in her mother; Heart failure  in her father and mother; Hyperlipidemia in her father and mother. Social History:   reports that she has quit smoking. Her smoking use included cigarettes. She has  never used smokeless tobacco. She reports current alcohol use. She reports that she does not use drugs.  Prior to Admission medications   Medication Sig Start Date End Date Taking? Authorizing Provider  albuterol (VENTOLIN HFA) 108 (90 Base) MCG/ACT inhaler Inhale 2 puffs into the lungs 2 (two) times daily as needed for shortness of breath or wheezing. 01/11/21   [provider]  Calcium Carbonate-Vit D-Min (CALCIUM 1200) 1200-1000 MG-UNIT CHEW Chew 2 tablets by mouth daily.    [provider]  carvedilol (COREG) 6.25 MG tablet Take 1 tablet (6.25 mg total) by mouth 2 (two) times daily. 04/11/22 08/21/23  Jerald Kief, MD  clotrimazole-betamethasone (LOTRISONE) cream Apply 1 Application topically daily as needed (for yeast/rash). 11/25/21   [provider]  diclofenac Sodium (VOLTAREN) 1 % GEL Apply 2 g topically in the morning and at bedtime. 12/02/21   [provider]  ferrous sulfate 325 (65 FE) MG tablet Take 325 mg by mouth in the morning and at bedtime.    [provider]  folic acid (FOLVITE) 1 MG tablet Take 1 mg by mouth 2 (two) times daily. 06/02/21   [provider]  furosemide (LASIX) 20 MG tablet Take 20 mg by mouth once a week. 05/27/21   [provider]  gabapentin (NEURONTIN) 600 MG tablet Take 600 mg by mouth 3 (three) times daily as needed (nerve pain). 07/23/21   [provider]  hydrOXYzine (ATARAX) 25 MG tablet Take 25 mg by mouth daily. 08/17/22   [provider]  insulin aspart (NOVOLOG) 100 UNIT/ML injection 0-20 Units, Subcutaneous, 3 times daily with meal CBG < 70: Implement Hypoglycemia measures CBG 70 - 120: 0 units CBG 121 - 150: 3 units CBG 151 - 200: 4 units CBG 201 - 250: 7 units CBG 251 - 300: 11 units CBG 301 - 350: 15 units CBG 351 - 400: 20 units CBG > 400: call MD 08/31/22   Maretta Bees, MD  isosorbide mononitrate (IMDUR) 30 MG 24 hr tablet Take by mouth. 08/07/23 08/06/24  [provider]  ketorolac (ACULAR) 0.5 % ophthalmic solution Place 1 drop into both eyes 4 (four) times daily as needed (dry eyes). 09/06/21   [provider]  lactulose (CHRONULAC) 10 GM/15ML solution Take 15 mLs (10 g total) by mouth 2 (two) times daily. for ammonia levels 08/31/22   Ghimire, Werner Lean, MD  oxyCODONE (OXY IR/ROXICODONE) 5 MG immediate release tablet Take 1 tablet (5 mg total) by mouth every 6 (six) hours as needed for moderate pain (pain score 4-6). 09/13/22   Adonis Huguenin, NP  pantoprazole (PROTONIX) 40 MG tablet Take 40 mg by mouth daily as needed (heartburn). 05/27/21   [provider]  pravastatin (PRAVACHOL) 20 MG tablet Take 1 tablet (20 mg total) by mouth at bedtime. Take after completion of daptomycin 01/03/22   [name], Rebekah Chesterfield, MD  rifaximin (XIFAXAN) 550 MG TABS tablet Take 1 tablet (550 mg total) by mouth 2 (two) times daily. 08/31/22   Ghimire, Werner Lean, MD  sertraline (ZOLOFT) 100 MG tablet Take 2 tablets (200 mg total) by mouth daily. Patient taking differently: Take 150 mg by mouth daily. 06/08/22 08/21/23  Erick Blinks, MD  sodium hypochlorite (DAKIN'S 1/2 STRENGTH) external solution Irrigate with 1 Application as directed daily. 03/08/23  Candelaria Stagers, DPM  spironolactone (ALDACTONE) 50 MG tablet Take 50 mg by mouth daily as needed (swelling). 05/27/21   [provider]  TOUJEO MAX SOLOSTAR 300 UNIT/ML Solostar Pen Inject 20 Units into the skin at bedtime. 08/31/22   Ghimire, Werner Lean, MD  vitamin C (ASCORBIC ACID) 500 MG tablet Take 500 mg by mouth 2 (two) times daily.    [provider]  Vitamin D, Ergocalciferol, (DRISDOL) 1.25 MG (50000 UNIT) CAPS capsule Take 1 capsule by mouth every 7 (seven) days. Mondays 04/05/21   [provider]    Current Facility-Administered Medications  Medication Dose Route Frequency Provider Last Rate Last Admin   0.9 %  sodium chloride infusion  250 mL Intravenous Continuous Simonne Martinet, NP       albumin human 5 % solution 25 g  25 g Intravenous Once Karie Fetch P, DO       And   furosemide (LASIX) injection 40 mg  40 mg Intravenous Once Karie Fetch P, DO       albuterol (PROVENTIL) (2.5 MG/3ML) 0.083% nebulizer solution 2.5 mg  2.5 mg Inhalation Q4H PRN Simonne Martinet, NP       Chlorhexidine Gluconate Cloth 2 % PADS 6 each  6 each Topical Daily Simonne Martinet, NP   6 each at 08/15/23 8295   diclofenac Sodium (VOLTAREN) 1 % topical gel 4 g  4 g Topical QID Simonne Martinet, NP   4 g at 08/14/23 2156   enoxaparin (LOVENOX) injection 40 mg  40 mg Subcutaneous Q24H Simonne Martinet, NP   40 mg at 08/15/23 1132   folic acid (FOLVITE) tablet 1 mg  1 mg Oral BID Simonne Martinet, NP   1 mg at 08/15/23 1129   gabapentin (NEURONTIN) capsule 600 mg  600 mg Oral Q8H PRN Simonne Martinet, NP   600 mg at 08/10/23 6213   Gerhardt's butt cream   Topical BID Kathlen Mody, MD   Given at 08/15/23 1302   hydrOXYzine (ATARAX) tablet 25 mg  25 mg Oral TID PRN Simonne Martinet, NP   25 mg at 08/12/23 2237   insulin aspart (novoLOG) injection 0-15 Units  0-15 Units Subcutaneous TID WC Doristine Counter, RPH   2 Units at 08/15/23 1301   insulin aspart (novoLOG) injection 0-5 Units  0-5 Units Subcutaneous QHS Simonne Martinet, NP   2 Units at 08/13/23 2110   insulin glargine-yfgn (SEMGLEE) injection 20 Units  20 Units Subcutaneous QHS Simonne Martinet, NP   20 Units at 08/14/23 2156   iohexol (OMNIPAQUE) 350 MG/ML injection 100 mL  100 mL Intravenous Once PRN Simonne Martinet, NP       lactulose (CHRONULAC) 10 GM/15ML solution 20 g  20 g Oral TID Simonne Martinet, NP   20 g at 08/15/23 1132   midodrine (PROAMATINE) tablet 5 mg  5 mg Oral TID WC Karie Fetch P, DO   5 mg at 08/15/23 1300   ondansetron (ZOFRAN) tablet 4 mg  4 mg Oral Q6H PRN Simonne Martinet, NP       Or   ondansetron (ZOFRAN) injection 4 mg  4 mg Intravenous Q6H PRN Simonne Martinet, NP   4 mg at 08/12/23 2117   Oral care mouth  rinse  15 mL Mouth Rinse 4 times per day Simonne Martinet, NP   15 mL at 08/15/23 1302   Oral care mouth rinse  15  mL Mouth Rinse PRN Simonne Martinet, NP       oxyCODONE (Oxy IR/ROXICODONE) immediate release tablet 5 mg  5 mg Oral Q6H PRN Lynnell Catalan, MD   5 mg at 08/12/23 2109   pantoprazole (PROTONIX) EC tablet 40 mg  40 mg Oral Daily Simonne Martinet, NP   40 mg at 08/15/23 1130   pravastatin (PRAVACHOL) tablet 20 mg  20 mg Oral QHS Simonne Martinet, NP   20 mg at 08/14/23 2156   rifaximin (XIFAXAN) tablet 550 mg  550 mg Oral BID Simonne Martinet, NP   550 mg at 08/15/23 1130   sertraline (ZOLOFT) tablet 150 mg  150 mg Oral Daily Simonne Martinet, NP   150 mg at 08/15/23 1129   spironolactone (ALDACTONE) tablet 25 mg  25 mg Oral Daily Karie Fetch P, DO        Allergies as of 08/07/2023 - Review Complete 08/07/2023  Allergen Reaction Noted   Chlorhexidine Rash 05/24/2021   Tape Rash 10/11/2021    Review of Systems:    Constitutional: No weight loss, fever, chills, weakness or fatigue HEENT: Eyes: No change in vision               Ears, Nose, Throat:  No change in hearing or congestion Skin: No rash or itching Cardiovascular: No chest pain, chest pressure or palpitations   Respiratory: No SOB or cough Gastrointestinal: See HPI and otherwise negative Genitourinary: No dysuria or change in urinary frequency Neurological: No headache, dizziness or syncope Musculoskeletal: No new muscle or joint pain Hematologic: No bleeding or bruising Psychiatric: No history of depression or anxiety     Physical Exam:  Vital signs in last 24 hours: Temp:  [98 F (36.7 C)-98.7 F (37.1 C)] 98.7 F (37.1 C) (08/27 1200) Pulse Rate:  [83-87] 87 (08/27 1200) Resp:  [16-18] 17 (08/27 1200) BP: (99-109)/(43-79) 106/52 (08/27 1200) SpO2:  [98 %-100 %] 100 % (08/27 0812) Weight:  [83 kg] 83 kg (08/27 0500) Last BM Date : 08/14/23 Last BM recorded by nurses in past 5 days Stool Type: Type 7  (Liquid consistency with no solid pieces) (08/14/2023 11:21 PM)  General : Chronically ill-appearing female Head:  Normocephalic and atraumatic. Eyes :  sclerae anicteric,conjunctive pink  Heart:  regular rate and rhythm, systolic murmur Pulm: Decreased breath sounds right lower lobe about one third up, diffuse decreased breath sounds, 4 L oxygen [place] Abdomen:   Soft, Obese AB, skin exam normal, Normal bowel sounds.  no  tenderness .Marland Kitchen  no   fluid wave, no  shifting dullness.  Extremities: Left BKA, right foot in boot from previous ulcer Msk:  Symmetrical without gross deformities.  Neurologic: Alert and  oriented x4;  grossly normal neurologically. without asterixis Skin:   without jaundice. no palmar erythema or spider angioma.  Bilateral ecchymosis. Psychiatric:  Demonstrates good judgement and reason without abnormal affect or behaviors.  LAB RESULTS: Recent Labs    08/13/23 0411 08/14/23 0416 08/15/23 0319  WBC 5.5 3.2* 3.3*  HGB 8.9* 7.4* 8.2*  HCT 27.6* 22.7* 25.4*  PLT 87* 60* 63*   BMET Recent Labs    08/13/23 0411 08/14/23 0416 08/15/23 0319  NA 139 139 139  K 4.1 3.4* 4.0  CL 107 106 106  CO2 24 28 26   GLUCOSE 162* 138* 165*  BUN 33* 35* 28*  CREATININE 1.25* 1.17* 1.04*  CALCIUM 8.2* 8.1* 8.0*   LFT Recent Labs    08/14/23 0416  PROT 4.9*  ALBUMIN 2.4*  AST 28  ALT 15  ALKPHOS 56  BILITOT 1.9*   PT/INR No results for input(s): "LABPROT", "INR" in the last 72 hours.  STUDIES: DG CHEST PORT 1 VIEW  Result Date: 08/14/2023 CLINICAL DATA:  [id number] Dyspnea [id number] EXAM: PORTABLE CHEST 1 VIEW COMPARISON:  Chest XR, 08/13/2023 and 08/11/2023. CT chest, 08/01/2023. FINDINGS: Cardiomediastinal silhouette is unchanged, with the apex silhouetted by patchy basilar opacities. Aortic arch atherosclerosis Aortic Atherosclerosis (ICD10-I70.0). Hypoinflation with persistent hazy opacification of the RIGHT lung. Perihilar and septal thickening within the LEFT chest. Small  volume RIGHT pleural effusion, no LEFT pleural effusion or pneumothorax. TIPS stent at the RIGHT upper quadrant and embolization coils at the LEFT upper quadrant. No interval osseous abnormality. IMPRESSION: Unchanged pulmonary findings RIGHT-greater-than-LEFT pulmonary edema, and small volume RIGHT pleural effusion. Electronically Signed   By: Roanna Banning M.D.   On: 08/14/2023 12:41      Impression    Decompensated cirrhosis AST 28 ALT 15 Alkphos 56 TBili 1.9 INR 08/09/2023 1.5  MELD 3.0: 20 at 08/11/2023  5:11 AM MELD-Na: 17 at 08/11/2023  5:11 AM Calculated from: Serum Creatinine: 1.10 mg/dL at 0/45/4098  1:19 AM Serum Sodium: 136 mmol/L at 08/11/2023  5:11 AM Total Bilirubin: 2.7 mg/dL at 1/47/8295  6:21 AM Serum Albumin: 1.9 g/dL at 02/23/6577  4:69 AM INR(ratio): 1.5 at 08/09/2023  1:11 AM Age at listing (hypothetical): 68 years Sex: Female at 08/11/2023  5:11 AM - Serial INR, CBC, CMET daily. - Daily MELD  Portal HTN s/p TIP 08/21 for  hepatic hydrothorax  Continues on 4 L oxygen, x-ray shows small right pleural effusion, with continuing left and right pulmonary edema. Just initiated on lasix 40 mg IV and spironolactone today, increased from lasix 20 mg yesterday. Patient is on midodrine 5 mg TID Has received albumin x 1 today Will treat with diuretics as patient is able to tolerate, monitor kidney function and blood pressure, continue albumin.  Increase diuretics if able, will continue to monitor. Contacted Dr. Ethelle Lyon with IR, will plan on interrogating shunt tomorrow, patient n.p.o. at midnight. This is unremarkable, with current x-rays some question if this is not underlying pulmonary edema not associated with portal hypertension. Pleurx catheter/pleurodesis would not be beneficial at this time. Patient not liver transplant candidate at this time. Will continue to follow along.  Hypotension BP 106/52 most recent On midodrine 5 mg TID Off HTN medications  Esophageal/gastric  Varices: Recent Labs  Lab 08/10/23 0359 08/11/23 0511 08/13/23 0411 08/14/23 0416 08/15/23 0319  HGB 9.6* 7.9* 8.9* 7.4* 8.2*  HGB stable, s/p 1 PRBC 08/21 to 9.6 has been stable at 7.9/8.2 last EGD 2022 Was on coreg, now s/p TIPS 08/21 No evidence of melena/hematochezia/hematemesis .   Hepatic Encephalopathy:  08/12/2023 Ammonia 31 - Lactulose 20ml  TID titrate to 3bms per day - Rifaximin 550mg  bid - minimize/remove all benzos and narcotics - add on zinc  Thrombocytopenia secondary to above Platelets 63  with Coagulopathy 08/09/2023 INR 1.5 Check PT/INR daily (Vitamin K/FFP if elevated for procedure)  History of PUD Continue protonix BID  AKI BUN 28 Cr 1.04  GFR 58  Potassium 4.0  Magnesium 1.9  Monitor  Principal Problem:   Decompensated hepatic cirrhosis (HCC) Active Problems:   Thrombocytopenia (HCC)   Mixed hyperlipidemia   Essential hypertension   CKD stage G3a/A3, GFR 45-59 and albumin creatinine ratio >300 mg/g (HCC)   History of peptic ulcer disease   Hypomagnesemia   Major  depressive disorder, single episode, unspecified   Type 2 diabetes mellitus with diabetic neuropathy, unspecified (HCC)   Recurrent pleural effusion on right   Acute respiratory failure with hypoxia (HCC)   Pressure injury of skin    LOS: 8 days    Thank you for your kind consultation, we will continue to follow.   Doree Albee  08/15/2023, 1:17 PM

## 2023-08-15 NOTE — Progress Notes (Addendum)
NAME:  Ariel Arnold, MRN:  [id number], DOB:  05/20/1956, LOS: 8 ADMISSION DATE:  08/07/2023, CONSULTATION DATE:  08/09/2023 REFERRING MD:  Blake Divine - TRH, CHIEF COMPLAINT:  Post-op hypoxia and hypotension    History of Present Illness:  67 year old woman who presented to [facility] ED 8/13 with SOB. 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  On ED presentation, found to have a right pleural effusion and a new supplemental oxygen requirement. She underwent thoracentesis x 2 (8/14 & 8/16) for concern of hepatic hydrothorax. She was transferred to [facility] 8/19 and underwent TIPS with IR on 8/21. She developed postprocedure hypotension requiring pressor support and hypoxia requiring BiPAP, prompting transfer to ICU. Eventually improved with diminished O2 requirement and was deemed stable for transfer to progressive.  Unfortunately, patient noted to have increasing O2 requirement 8/26-8/27 up to [facility] prompting PCCM reconsult for hepatic hydrothorax management/possible PleurX catheter consideration.  Of note, patient does not have established GI care at present; she was previously seen at [facility] (last 2022) for NASH cirrhosis but was lost to follow up. EGD 05/2021 demonstrated grade 1 esophageal varix, fundal varix with bleeding stigmata, moderately severe PHG and BRTO was recommended.  She was at one point on Lasix and spironolactone as well as Coreg for nonselective beta blockage for portal HTN.  Pertinent Medical History:  NASH cirrhosis with ascites, gastric varices  T2DM HTN PVD  Depression, Anxiety  Significant Hospital Events: Including procedures, antibiotic start and stop dates in addition to other pertinent events   8/13 Presented to [facility] with complaints of shortness of breath 8/14  Underwent thoracentesis [facility] 8/16 Underwent repeat thoracentesis at [facility] 8/19 Transferred to [facility] for interventional radiology evaluation of TIPS procedure 8/21 Underwent TIPS with interventional radiology, postprocedure patient was seen hypotensive and hypoxic resulting in initiation of pressors and PCCM consult 8/22 Weaned to room air  8/23 Increased O2 needs 8/24 BP stable holding midodrine so this was stopped. POCUS showed mod right effusion added lasix. Aline removed. Stable for Progressive. 8/27 PCCM reconsulted for increased O2 requirement.  Interim History / Subjective:  Feeling overall poorly today Answering questions appropriately Feels itchy a lot of the time Increased O2 requirement up to [facility] Bedside POCUS completed with large R pleural effusion, recurrent Plan for diuretic uptitration/addition of spironolactone GI consult requested as patient has no active GI provider, previously seen at [facility] but lost to f/u  Objective:  Blood pressure (!) 107/51, pulse 87, temperature 98.4 F (36.9 C), temperature source Oral, resp. rate 18, height 5\' 8"  (1.727 m), weight 83 kg, SpO2 100%.        Intake/Output Summary (Last 24 hours) at 08/15/2023 1135 Last data filed at 08/14/2023 1616 Gross per 24 hour  Intake 720 ml  Output 300 ml  Net 420 ml   Filed Weights   08/12/23 0500 08/13/23 0452 08/15/23 0500  Weight: 79.9 kg 81.4 kg 83 kg   Physical Examination: General: Chronically ill-appearing older woman in NAD. HEENT: [place]/AT, anicteric sclera, PERRL, moist mucous membranes. Neuro: Awake, oriented x 4. Responds to verbal stimuli. Following commands consistently. Moves all 4 extremities spontaneously. Generalized weakness/deconditioning. CV: RRR, no m/g/r. PULM: Breathing even and mildly labored on 4LNC. Lung fields considerably diminished on R from mid-lung to base. GI: Soft, nontender, mildly distended. Palpable splenomegaly. Hypoactive bowel  sounds. Extremities: Bilateral symmetric 1-2+ nonpitting LE edema noted, L BKA with well-healed stump. Skin: Warm/dry, no rashes. Scattered ecchymosis to back/trunk and BUE.  Bedside POCUS of R Pleural Space   Resolved Hospital Problem List:   Hypotension  Assessment & Plan:  Acute hypoxic respiratory failure with recurrent pleural effusion in the setting of hepatic hydrothorax Recurrent R-sided hepatic hydrothorax S/p thoracentesis x 3 [8/14, 8/16, 8/21 (IR, co-therapy with para/TIPS)]. Off of supplemental O2 8/25; slowly increasing supplemental O2 requirement up to [facility] 8/26-8/27. PCCM re-engaged 8/27. - Bedside POCUS with recurrence of large R pleural effusion (see Media tab/above) - Intermittent CXR - Continue supplemental O2 support - Wean O2 for sat > 90% - Pulmonary hygiene - Optimize diuresis as tolerated; likely needs considerable uptitration of Lasix and addition of spironolactone (has been on this previously, this was prior to fluid accumulation of this degree) - Can consider repeat thora if patient wishes, though this would be temporizing - Would not recommend PleurX catheter at this juncture, as patient does not wish to pursue hospice care at this time and this would almost certainly expedite her decompensation with protein losses  NASH Cirrhosis S/p TIPS 8/21 with post-procedure hypotension and respiratory distress, otherwise stable neurologic status.  - Continue lactulose, Rifaximin - Consider Zn supplementation - Optimize diuretics with Lasix, spironolactone; uptitrate as tolerated - GI consulted for optimization prior to last resort PleurX consideration, appreciate assistance - May benefit from TIPS interrogation with IR  CKD stage 3a Hypokalemia - Trend BMP - Replete electrolytes as indicated - Monitor I&Os in the setting of diuresis - Avoid nephrotoxic agents as able - Ensure adequate renal perfusion - Consider midodrine initiation  Type 2 Diabetes  Mellitus - SSI - CBGs ACHS - Goal CBG 140-180  Hyperlipidemia  - Continue statin  Depression  Anxiety  - Continue Zoloft, this should also help a bit with pruritus  Best Practice (right click and "Reselect all SmartList Selections" daily)   Diet/type: Regular DVT prophylaxis: not indicated due to thrombocytopenia  GI prophylaxis: PPI Lines: N/A Foley:  N/A Code Status:  DNR Last date of multidisciplinary goals of care discussion   Signature:   Faythe Ghee [facility] 08/15/23 11:36 AM  Please see [email or web address] for pager details.  From 7A-7P if no response, please call [phone] After hours, please call ELink [phone]

## 2023-08-15 NOTE — Progress Notes (Signed)
Pt was given CHG bath.

## 2023-08-15 NOTE — Progress Notes (Signed)
[facility]                                                                               Ariel Arnold, is a 67 y.o. female, DOB - Oct 27, 1956, ZOX:[id number] Admit date - 08/07/2023    Outpatient Primary MD for the patient is Joaquin Courts, DO  LOS - 8  days    Brief summary   Taken from H&P.  Ariel Arnold is a pleasant 67 y.o. female with medical history significant for NASH cirrhosis with ascites, insulin-dependent diabetes mellitus, depression, anxiety, and right heel ulcer who presented to [facility] on 08/01/2023 with shortness of breath. Patient had first paracentesis approximately a week prior to this presentation.  [facility] Hospital Course: Upon arrival to the ED, patient was found to have large right pleural effusion and a new supplemental oxygen requirement.  She was admitted to the hospitalist service and underwent therapeutic thoracenteses on 08/02/2023 and again on 08/04/2023.  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  Dr. Archer Asa of IR agreed to see the patient in consultation for consideration of TIPS.  She was transferred to [facility] under hospitalist for IR evaluation.  SHE underwent TIPS on procedure on 8/21 with postprocedure hypotension and respiratory distress requiring transfer to ICU for vasopressors.  She was transferred back to [facility] on 08/13/2023. She was on RA on 8/25, but back on 4 lit of Esmeralda oxygen. CXR showing pulmonary edema with right sided pleural effusion. She was restarted on lasix 20 mg BID without much diuresis. On exam today she is more dyspneic on talking, does nt fee too good to go home.      Assessment & Plan    Assessment and Plan:  Acute hypoxic respiratory failure secondary to recurrent pleural effusion due to hepatic hydrothorax and pulmonary edema S/p thoracentesis x 3, on 8/14, 8/16 and 8/21/ Continues to be Dyspneic last night and this am, requiring 3  to 4 lit/min of oxygen.  CXR showed pulmonary  edema and right sided effusion.  PCCM re consulted to see if she needs another thoracentesis, would she benefit from pleurx catheter.  Repeat CXR ordered for today.   NASH cirrhosis S/p TIPS on 08/09/2023 with postprocedure hypotension and respiratory distress Continue with lactulose and rifaximin   Stage IIIa CKD Creatinine stable at 1.1 Continue with lasix 20 mg BID.     Hypokalemia Replaced Magnesium level within normal limits.    Insulin-dependent diabetes mellitus Continue with Semglee and sliding scale insulin CBG (last 3)  Recent Labs    08/15/23 0027 08/15/23 0532 08/15/23 0812  GLUCAP 152* 152* 144*   Poor oral intake. Continue with gabapentin for diabetic neuropathy   Anemia of chronic disease From cirrhosis. Hemoglobin around 8.2. transfuse to keep hemoglobin greater than 7.     Chronic thrombocytopenia From cirrhosis, platelets around 63,000.  Continue to monitor    Depression/anxiety Continue with sertraline.    Hyperlipidemia Continue with Pravachol  In view of poor progression, deconditioning palliative care consulted, patient declined hospice, wants to go home with [facility].     RN Pressure Injury Documentation: Pressure Injury 08/07/23 Sacrum Right;Left;Mid Stage 2 -  Partial thickness  loss of dermis presenting as a shallow open injury with a red, pink wound bed without slough. (Active)  08/07/23 1507  Location: Sacrum  Location Orientation: Right;Left;Mid  Staging: Stage 2 -  Partial thickness loss of dermis presenting as a shallow open injury with a red, pink wound bed without slough.  Wound Description (Comments):   Present on Admission: Yes  Dressing Type Other (Comment) 08/14/23 0816     Pressure Injury 08/07/23 Heel Right Stage 2 -  Partial thickness loss of dermis presenting as a shallow open injury with a red, pink wound bed without slough. (Active)  08/07/23 1507  Location: Heel  Location Orientation: Right  Staging: Stage 2 -   Partial thickness loss of dermis presenting as a shallow open injury with a red, pink wound bed without slough.  Wound Description (Comments):   Present on Admission: Yes  Dressing Type Foam - Lift dressing to assess site every shift 08/13/23 2113   WOUND care on on board with recommendations.   Estimated body mass index is 27.82 kg/m as calculated from the following:   Height as of this encounter: 5\' 8"  (1.727 m).   Weight as of this encounter: 83 kg.  Code Status: DNR.  DVT Prophylaxis:  enoxaparin (LOVENOX) injection 40 mg Start: 08/10/23 1015 SCDs Start: 08/07/23 1524   Level of Care: Level of care: Med-Surg Family Communication: None at bedside  Procedures:  TIPS   Consults:  PCCM IR Palliative care.   Antimicrobials:   Anti-infectives (From admission, onward)    Start     Dose/Rate Route Frequency Ordered Stop   08/10/23 1000  rifaximin (XIFAXAN) tablet 550 mg        550 mg Oral 2 times daily 08/10/23 0916     08/09/23 1515  cefTRIAXone (ROCEPHIN) 2 g in sodium chloride 0.9 % 100 mL IVPB        2 g 200 mL/hr over 30 Minutes Intravenous  Once 08/09/23 1507 08/09/23 1536   08/09/23 1330  cefTRIAXone (ROCEPHIN) 1 g in sodium chloride 0.9 % 100 mL IVPB  Status:  Discontinued        1 g 200 mL/hr over 30 Minutes Intravenous To Radiology 08/09/23 1316 08/09/23 1506        Medications  Scheduled Meds:  Chlorhexidine Gluconate Cloth  6 each Topical Daily   diclofenac Sodium  4 g Topical QID   enoxaparin (LOVENOX) injection  40 mg Subcutaneous Q24H   folic acid  1 mg Oral BID   furosemide  20 mg Oral BID   Gerhardt's butt cream   Topical BID   insulin aspart  0-15 Units Subcutaneous TID WC   insulin aspart  0-5 Units Subcutaneous QHS   insulin glargine-yfgn  20 Units Subcutaneous QHS   lactulose  20 g Oral TID   midodrine  5 mg Oral TID WC   mouth rinse  15 mL Mouth Rinse 4 times per day   pantoprazole  40 mg Oral Daily   pravastatin  20 mg Oral QHS    rifaximin  550 mg Oral BID   sertraline  150 mg Oral Daily   Continuous Infusions:  sodium chloride     PRN Meds:.albuterol, gabapentin, hydrOXYzine, iohexol, ondansetron **OR** ondansetron (ZOFRAN) IV, mouth rinse, oxyCODONE    Subjective:   Ariel Arnold was seen and examined today.  Still dyspneic.   Objective:   Vitals:   08/14/23 2157 08/15/23 0500 08/15/23 0532 08/15/23 0812  BP: (!) 99/43  (!) 109/49 Marland Kitchen)  107/51  Pulse: 83  85 87  Resp: 16   18  Temp: 98.1 F (36.7 C)  98 F (36.7 C) 98.4 F (36.9 C)  TempSrc: Oral   Oral  SpO2: 100%  98% 100%  Weight:  83 kg    Height:        Intake/Output Summary (Last 24 hours) at 08/15/2023 1124 Last data filed at 08/14/2023 1616 Gross per 24 hour  Intake 720 ml  Output 300 ml  Net 420 ml   Filed Weights   08/12/23 0500 08/13/23 0452 08/15/23 0500  Weight: 79.9 kg 81.4 kg 83 kg     Exam  General exam: Appears calm and comfortable  Respiratory system: diminished at bases, on 4 lit/min.  Cardiovascular system: S1 & S2 heard, RRR. No JVD,  Gastrointestinal system: Abdomen is nondistended, soft and nontender.  Central nervous system: Alert and oriented. No focal neurological deficits. Extremities: Symmetric 5 x 5 power. Skin: No rashes,  Psychiatry: Mood & affect appropriate.    Data Reviewed:  I have personally reviewed following labs and imaging studies   CBC Lab Results  Component Value Date   WBC 3.3 (L) 08/15/2023   RBC 2.56 (L) 08/15/2023   HGB 8.2 (L) 08/15/2023   HCT 25.4 (L) 08/15/2023   MCV 99.2 08/15/2023   MCH 32.0 08/15/2023   PLT 63 (L) 08/15/2023   MCHC 32.3 08/15/2023   RDW 15.3 08/15/2023   LYMPHSABS 1.0 08/15/2023   MONOABS 0.3 08/15/2023   EOSABS 0.3 08/15/2023   BASOSABS 0.0 08/15/2023     Last metabolic panel Lab Results  Component Value Date   NA 139 08/15/2023   K 4.0 08/15/2023   CL 106 08/15/2023   CO2 26 08/15/2023   BUN 28 (H) 08/15/2023   CREATININE 1.04 (H)  08/15/2023   GLUCOSE 165 (H) 08/15/2023   GFRNONAA 58 (L) 08/15/2023   CALCIUM 8.0 (L) 08/15/2023   PHOS 3.7 08/10/2023   PROT 4.9 (L) 08/14/2023   ALBUMIN 2.4 (L) 08/14/2023   BILITOT 1.9 (H) 08/14/2023   ALKPHOS 56 08/14/2023   AST 28 08/14/2023   ALT 15 08/14/2023   ANIONGAP 7 08/15/2023    CBG (last 3)  Recent Labs    08/15/23 0027 08/15/23 0532 08/15/23 0812  GLUCAP 152* 152* 144*      Coagulation Profile: Recent Labs  Lab 08/09/23 0111  INR 1.5*     Radiology Studies: DG CHEST PORT 1 VIEW  Result Date: 08/14/2023 CLINICAL DATA:  [id number] Dyspnea [id number] EXAM: PORTABLE CHEST 1 VIEW COMPARISON:  Chest XR, 08/13/2023 and 08/11/2023. CT chest, 08/01/2023. FINDINGS: Cardiomediastinal silhouette is unchanged, with the apex silhouetted by patchy basilar opacities. Aortic arch atherosclerosis Aortic Atherosclerosis (ICD10-I70.0). Hypoinflation with persistent hazy opacification of the RIGHT lung. Perihilar and septal thickening within the LEFT chest. Small volume RIGHT pleural effusion, no LEFT pleural effusion or pneumothorax. TIPS stent at the RIGHT upper quadrant and embolization coils at the LEFT upper quadrant. No interval osseous abnormality. IMPRESSION: Unchanged pulmonary findings RIGHT-greater-than-LEFT pulmonary edema, and small volume RIGHT pleural effusion. Electronically Signed   By: Roanna Banning M.D.   On: 08/14/2023 12:41       Kathlen Mody M.D. [facility] 08/15/2023, 11:24 AM  Available via Epic secure chat 7am-7pm After 7 pm, please refer to night coverage provider listed on amion.

## 2023-08-15 NOTE — Consult Note (Signed)
Chief Complaint: Decompensated NASH Cirrhosis with portal hypertension and recurrent right sided hydrothorax. Request is for TIPS evaluation possible revision.   Referring Physician(s): Bradley,Candace  Supervising Physician: Marliss Coots  Patient Status: [facility] - In-pt  History of Present Illness: Ariel Arnold is a 67 y.o. female inpatient. History of DM, anemia, osteomyelitis s/p Left BKA, decompensated NASH Cirrhosis with portal hypertension, GI bleed, gastric varices, esophageal varices, hepatic encephalopathy and hepatic hydrothorax. Patient was found to have a MELD of 15 and a Child Pugh Class B. On 8.21.24 IR Attending Dr. Roanna Banning performed a TIPS with a *=2 GORE Viatorr stent placement a paracentesis and a right sided thoracentesis. Team concerned for persistent hepatic hydrothorax. Team is requesting a TIPS evaluation possible revision.   Currently without any significant complaints. Patient alert and laying in bed,calm. Endorse SHOB when specifically asked. Denies any fevers, headache, chest pain, cough, abdominal pain, nausea, vomiting or bleeding. Return precautions and treatment recommendations and follow-up discussed with the patient  who is agreeable with the plan.    Past Medical History:  Diagnosis Date   Anemia    Anxiety    Aortic atherosclerosis (HCC)    Arthritis    AVM (arteriovenous malformation) of colon    Depression    Essential hypertension    GI bleeding    Recurrent   Hepatic encephalopathy (HCC)    History of kidney stones    History of RSV infection    Liver cirrhosis secondary to NASH (HCC) 2004   Biopsy-proven   Obesity    Osteomyelitis (HCC)    Peripheral vascular disease (HCC)    Thrombocytopenia (HCC)    Type 2 diabetes mellitus (HCC)     Past Surgical History:  Procedure Laterality Date   ABDOMINAL HYSTERECTOMY     AMPUTATION Left 04/05/2022   Procedure: Metatarsal amputation revision;  Surgeon: Louann Sjogren, MD;   Location: [facility];  Service: Podiatry;  Laterality: Left;  Surgical team to do block   AMPUTATION Left 08/26/2022   Procedure: LEFT BELOW KNEE AMPUTATION;  Surgeon: Nadara Mustard, MD;  [facility];  Service: Orthopedics;  Laterality: Left;   APPLICATION OF WOUND VAC Left 08/26/2022   Procedure: APPLICATION OF WOUND VAC;  Surgeon: Nadara Mustard, MD;  Location: [facility] OR;  Service: Orthopedics;  Laterality: Left;   COLONOSCOPY     Cystoscopy with ureteral stent     ESOPHAGOGASTRODUODENOSCOPY     ESOPHAGOGASTRODUODENOSCOPY (EGD) WITH PROPOFOL N/A 05/20/2021   Procedure: ESOPHAGOGASTRODUODENOSCOPY (EGD) WITH PROPOFOL;  Surgeon: Corbin Ade, MD;  [facility];  Service: Endoscopy;  Laterality: N/A;  needs intubation   IR ANGIOGRAM FOLLOW UP STUDY  05/21/2021   IR ANGIOGRAM SELECTIVE EACH ADDITIONAL VESSEL  05/21/2021   IR EMBO VENOUS NOT HEMORR HEMANG  INC GUIDE ROADMAPPING  05/21/2021   IR PARACENTESIS  08/09/2023   IR THORACENTESIS ASP PLEURAL SPACE W/IMG GUIDE  08/09/2023   IR TIPS  08/09/2023   IR US GUIDE VASC ACCESS RIGHT  05/21/2021   IR US GUIDE VASC ACCESS RIGHT  08/09/2023   IR VENOGRAM RENAL UNI LEFT  05/21/2021   IRRIGATION AND DEBRIDEMENT FOOT Left 06/04/2022   Procedure: REVISIONAL TRAMSMETATARSAL AMPUTATION;  Surgeon: Candelaria Stagers, DPM;  Location: [facility];  Service: Podiatry;  Laterality: Left;   RADIOLOGY WITH ANESTHESIA N/A 05/21/2021   Procedure: IR WITH ANESTHESIA;  Surgeon: Radiologist, Medication, MD;  Location: [facility] OR;  Service: Radiology;  Laterality: N/A;   TIPS PROCEDURE N/A  08/09/2023   Procedure: TRANS-JUGULAR INTRAHEPATIC PORTAL SHUNT (TIPS);  Surgeon: Roanna Banning, MD;  Location: [facility] OR;  Service: Radiology;  Laterality: N/A;   Toe amputations Bilateral    TUBAL LIGATION     WOUND DEBRIDEMENT Left 11/12/2021   Procedure: DEBRIDEMENT WOUND;FIRST METATARSAL RESECTION; APPLICATION OF SKIN GRAFT SUBSTITUTE;  Surgeon: Park Liter, DPM;  Location: [facility];  Service: Podiatry;   Laterality: Left;    Allergies: Tape  Medications: Prior to Admission medications   Medication Sig Start Date End Date Taking? Authorizing Provider  albuterol (VENTOLIN HFA) 108 (90 Base) MCG/ACT inhaler Inhale 2 puffs into the lungs 2 (two) times daily as needed for shortness of breath or wheezing. 01/11/21   [provider]  Calcium Carbonate-Vit D-Min (CALCIUM 1200) 1200-1000 MG-UNIT CHEW Chew 2 tablets by mouth daily.    [provider]  carvedilol (COREG) 6.25 MG tablet Take 1 tablet (6.25 mg total) by mouth 2 (two) times daily. 04/11/22 08/21/23  Jerald Kief, MD  clotrimazole-betamethasone (LOTRISONE) cream Apply 1 Application topically daily as needed (for yeast/rash). 11/25/21   [provider]  diclofenac Sodium (VOLTAREN) 1 % GEL Apply 2 g topically in the morning and at bedtime. 12/02/21   [provider]  ferrous sulfate 325 (65 FE) MG tablet Take 325 mg by mouth in the morning and at bedtime.    [provider]  folic acid (FOLVITE) 1 MG tablet Take 1 mg by mouth 2 (two) times daily. 06/02/21   [provider]  furosemide (LASIX) 20 MG tablet Take 20 mg by mouth once a week. 05/27/21   [provider]  gabapentin (NEURONTIN) 600 MG tablet Take 600 mg by mouth 3 (three) times daily as needed (nerve pain). 07/23/21   [provider]  hydrOXYzine (ATARAX) 25 MG tablet Take 25 mg by mouth daily. 08/17/22   [provider]  insulin aspart (NOVOLOG) 100 UNIT/ML injection 0-20 Units, Subcutaneous, 3 times daily with meal CBG < 70: Implement Hypoglycemia measures CBG 70 - 120: 0 units CBG 121 - 150: 3 units CBG 151 - 200: 4 units CBG 201 - 250: 7 units CBG 251 - 300: 11 units CBG 301 - 350: 15 units CBG 351 - 400: 20 units CBG > 400: call MD 08/31/22   Maretta Bees, MD  isosorbide mononitrate (IMDUR) 30 MG 24 hr tablet Take by mouth. 08/07/23 08/06/24  [provider]  ketorolac (ACULAR) 0.5 % ophthalmic  solution Place 1 drop into both eyes 4 (four) times daily as needed (dry eyes). 09/06/21   [provider]  lactulose (CHRONULAC) 10 GM/15ML solution Take 15 mLs (10 g total) by mouth 2 (two) times daily. for ammonia levels 08/31/22   Ghimire, Werner Lean, MD  oxyCODONE (OXY IR/ROXICODONE) 5 MG immediate release tablet Take 1 tablet (5 mg total) by mouth every 6 (six) hours as needed for moderate pain (pain score 4-6). 09/13/22   Adonis Huguenin, NP  pantoprazole (PROTONIX) 40 MG tablet Take 40 mg by mouth daily as needed (heartburn). 05/27/21   [provider]  pravastatin (PRAVACHOL) 20 MG tablet Take 1 tablet (20 mg total) by mouth at bedtime. Take after completion of daptomycin 01/03/22   [name], Rebekah Chesterfield, MD  rifaximin (XIFAXAN) 550 MG TABS tablet Take 1 tablet (550 mg total) by mouth 2 (two) times daily. 08/31/22   Ghimire, Werner Lean, MD  sertraline (ZOLOFT) 100 MG tablet Take 2 tablets (200 mg total) by mouth daily. Patient taking  differently: Take 150 mg by mouth daily. 06/08/22 08/21/23  Erick Blinks, MD  sodium hypochlorite (DAKIN'S 1/2 STRENGTH) external solution Irrigate with 1 Application as directed daily. 03/08/23   Candelaria Stagers, DPM  spironolactone (ALDACTONE) 50 MG tablet Take 50 mg by mouth daily as needed (swelling). 05/27/21   [provider]  TOUJEO MAX SOLOSTAR 300 UNIT/ML Solostar Pen Inject 20 Units into the skin at bedtime. 08/31/22   Ghimire, Werner Lean, MD  vitamin C (ASCORBIC ACID) 500 MG tablet Take 500 mg by mouth 2 (two) times daily.    [provider]  Vitamin D, Ergocalciferol, (DRISDOL) 1.25 MG (50000 UNIT) CAPS capsule Take 1 capsule by mouth every 7 (seven) days. Mondays 04/05/21   [provider]     Family History  Problem Relation Age of Onset   Heart failure Mother    Hyperlipidemia Mother    Diabetes Mellitus II Mother    Heart failure Father    Hyperlipidemia Father     Social History   Socioeconomic History   Marital  status: Widowed    Spouse name: Not on file   Number of children: Not on file   Years of education: Not on file   Highest education level: Not on file  Occupational History   Not on file  Tobacco Use   Smoking status: Former    Types: Cigarettes   Smokeless tobacco: Never  Vaping Use   Vaping status: Never Used  Substance and Sexual Activity   Alcohol use: Yes    Comment: wine rare   Drug use: Never   Sexual activity: Not on file  Other Topics Concern   Not on file  Social History Narrative   Not on file   Social Determinants of Health   Financial Resource Strain: Medium Risk (08/03/2023)   Received from [facility]   Overall Financial Resource Strain (CARDIA)    Difficulty of Paying Living Expenses: Somewhat hard  Food Insecurity: No Food Insecurity (08/07/2023)   Hunger Vital Sign    Worried About Running Out of Food in the Last Year: Never true    Ran Out of Food in the Last Year: Never true  Recent Concern: Food Insecurity - Food Insecurity Present (08/03/2023)   Received from [facility]   Hunger Vital Sign    Worried About Running Out of Food in the Last Year: Sometimes true    Ran Out of Food in the Last Year: Sometimes true  Transportation Needs: No Transportation Needs (08/07/2023)   PRAPARE - Administrator, Civil Service (Medical): No    Lack of Transportation (Non-Medical): No  Recent Concern: Transportation Needs - Unmet Transportation Needs (08/03/2023)   Received from [facility] - Transportation    Lack of Transportation (Medical): Yes    Lack of Transportation (Non-Medical): Yes  Physical Activity: Inactive (07/16/2020)   Received from [facility]   Exercise Vital Sign    Days of Exercise per Week: 0 days    Minutes of Exercise per Session: 0 min  Stress: No Stress Concern Present (06/11/2022)   Received from [facility] - Occupational  Stress Questionnaire    Feeling of Stress : Only a little  Social Connections: Socially Isolated (08/03/2023)   Received from [facility]   Social Connection and Isolation Panel [NHANES]    Frequency of Communication with Friends and Family:  Twice a week    Frequency of Social Gatherings with Friends and Family: Never    Attends Religious Services: 1 to 4 times per year    Active Member of [employer] or Organizations: No    Attends Banker Meetings: Never    Marital Status: Widowed    Review of Systems: A 12 point ROS discussed and pertinent positives are indicated in the HPI above.  All other systems are negative.  Review of Systems  Constitutional:  Negative for fatigue and fever.  HENT:  Negative for congestion.   Respiratory:  Positive for shortness of breath. Negative for cough.   Gastrointestinal:  Negative for abdominal pain, diarrhea, nausea and vomiting.    Vital Signs: BP (!) 106/52 (BP Location: Right Arm)   Pulse 87   Temp 98.7 F (37.1 C) (Oral)   Resp 17   Ht 5\' 8"  (1.727 m)   Wt 182 lb 15.7 oz (83 kg)   SpO2 100%   BMI 27.82 kg/m   Advance Care Plan: The advanced care plan/surrogate decision maker was discussed at the time of visit and documented in the medical record. Daughter   Physical Exam Vitals and nursing note reviewed.  Constitutional:      Appearance: She is well-developed. She is ill-appearing.  HENT:     Head: Normocephalic and atraumatic.  Eyes:     Conjunctiva/sclera: Conjunctivae normal.  Cardiovascular:     Rate and Rhythm: Normal rate and regular rhythm.     Heart sounds: Normal heart sounds.  Pulmonary:     Comments: On O2 via [place] Musculoskeletal:        General: Normal range of motion.     Cervical back: Normal range of motion.     Comments: Left sided BKA  Skin:    General: Skin is warm and dry.  Neurological:     General: No focal deficit present.     Mental Status: She is alert and oriented to person, place, and  time.  Psychiatric:        Mood and Affect: Mood normal.        Behavior: Behavior normal.        Thought Content: Thought content normal.        Judgment: Judgment normal.     Imaging: DG CHEST PORT 1 VIEW  Result Date: 08/14/2023 CLINICAL DATA:  [id number] Dyspnea [id number] EXAM: PORTABLE CHEST 1 VIEW COMPARISON:  Chest XR, 08/13/2023 and 08/11/2023. CT chest, 08/01/2023. FINDINGS: Cardiomediastinal silhouette is unchanged, with the apex silhouetted by patchy basilar opacities. Aortic arch atherosclerosis Aortic Atherosclerosis (ICD10-I70.0). Hypoinflation with persistent hazy opacification of the RIGHT lung. Perihilar and septal thickening within the LEFT chest. Small volume RIGHT pleural effusion, no LEFT pleural effusion or pneumothorax. TIPS stent at the RIGHT upper quadrant and embolization coils at the LEFT upper quadrant. No interval osseous abnormality. IMPRESSION: Unchanged pulmonary findings RIGHT-greater-than-LEFT pulmonary edema, and small volume RIGHT pleural effusion. Electronically Signed   By: Roanna Banning M.D.   On: 08/14/2023 12:41   DG Chest Port 1 View  Result Date: 08/13/2023 CLINICAL DATA:  Pleural effusion EXAM: PORTABLE CHEST 1 VIEW COMPARISON:  Two days ago FINDINGS: Cardiomegaly. Diffuse interstitial opacity with sizable right pleural effusion. No pneumothorax. Embolization coils in the left upper quadrant and stent over the right upper quadrant. IMPRESSION: Pulmonary edema and right pleural effusion, similar to prior. Electronically Signed   By: Tiburcio Pea M.D.   On: 08/13/2023 08:13   DG CHEST PORT 1  VIEW  Result Date: 08/11/2023 CLINICAL DATA:  Shortness of breath, cough. EXAM: PORTABLE CHEST 1 VIEW COMPARISON:  August 10, 2023. FINDINGS: Stable cardiomediastinal silhouette. Grossly stable bilateral diffuse lung opacities are noted, right greater than left, concerning for edema or pneumonia. Right pleural effusion is again noted. Bony thorax is unremarkable.  IMPRESSION: Stable bilateral lung opacities as noted above with associated right pleural effusion. Electronically Signed   By: Lupita Raider M.D.   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 MELD 16 (08/09/2023).  Child Pugh class B, 9 points. EXAM: Procedures: 1. TRANSJUGULAR INTRAHEPATIC PORTOSYSTEMIC SHUNT (TIPS) 2. INTRACARDIAC ECHOCARDIOGRAPHY (ICE) 3. THERAPEUTIC RIGHT THORACENTESIS 4. THERAPEUTIC PARACENTESIS Performing Physician: Roanna Banning, MD Assistant(s): Acquanetta Belling, MD A qualified trainee/resident or advanced practice provider (APP) was not immediately available to assist with this case. MEDICATIONS: As antibiotic prophylaxis, Rocephin 2 gm IV was ordered pre-procedure and administered intravenously within one hour of incision. ANESTHESIA/SEDATION: Sedation by the Anesthesia Team was performed. Please see anesthesiology log for details. CONTRAST:  75 mL Omnipaque 300, intravenous FLUOROSCOPY TIME:  Fluoroscopic dose; 80 mGy COMPLICATIONS: None immediate. PROCEDURE: Informed written consent was obtained from the patient and/or patient's representative after a thorough discussion of the procedural risks, benefits and alternatives. All questions were addressed. Maximal Sterile Barrier Technique was utilized including caps, mask, sterile gowns, sterile gloves, sterile drape, hand hygiene and skin antiseptic. A timeout was performed prior to the initiation of the procedure. Initial ultrasound scanning demonstrates a large amount of pleural effusion within the RIGHT chest. Under direct ultrasound guidance, a 6 Fr pigtail catheter was introduced. An ultrasound image was saved for documentation purposes. Therapeutic RIGHT thoracentesis was performed. Additional ultrasound scanning of the abdomen demonstrated a large amount of ascites. Under  direct ultrasound guidance, a 7 Fr pigtail catheter was introduced. An ultrasound image was saved for documentation purposes. Therapeutic paracentesis was performed. A preliminary ultrasound of the RIGHT groin was performed and demonstrates a patent greater saphenous vein. A permanent ultrasound image was recorded. Using a combination of fluoroscopy and ultrasound, an access site was determined. A small dermatotomy was made at the planned puncture site. Using ultrasound guidance, access into the RIGHT greater saphenous vein was obtained with visualization of needle entry into the vessel using a standard micropuncture technique. A wire was advanced into the IVC insert all fascial dilation performed. An 8 Fr, 10 cm vascular sheath was placed into the external iliac vein. Through this access site, an 8 Fr AcuNav ICE catheter was advanced with ease under fluoroscopic guidance to the level of the intrahepatic inferior vena cava. A preliminary ultrasound of the RIGHT neck was performed and demonstrates a patent internal jugular vein. A permanent ultrasound image was recorded. Using a combination of fluoroscopy and ultrasound, an access site was determined. A small dermatotomy was made at the planned puncture site. Using ultrasound guidance, access into the RIGHT internal jugular vein was obtained with visualization of needle entry into the vessel using a standard micropuncture technique. A wire was advanced into the IVC and serial fascial dilation performed. A 10 Fr Argon sheath was placed into the internal jugular vein and advanced to the IVC. The jugular sheath was retracted into the RIGHT atrium and manometry was performed. A 5 Fr angled tip catheter was then directed into the RIGHT hepatic vein. Hepatic venogram was performed. These images demonstrated  patent hepatic vein with no stenosis. The catheter was advanced to a wedge portion of the a patent vein over which the sheath was advanced into the distal RIGHT hepatic  vein. Using ICE ultrasound visualization the catheter as RIGHT hepatic vein as well as the portal anatomy was defined. A planned exit site from the hepatic vein and puncture site from the portal vein was placed into a single sonographic plane. Under direct ultrasound visualization, the Argon Scorpion X needle was advanced into the central RIGHT portal vein. Hand injection of contrast confirmed position within the portal system. A Glidewire was then advanced into the superior mesenteric vein. The tract was pre dilated with a 6 mm mustang balloon. A 5 Fr marking pigtail catheter was then advanced over the wire into the main portal vein and wire removed. Portal venogram was performed which demonstrated a patent portal vein. Portosystemic collaterals esophageal varices were seen arising from the main portal vein. Portal manometry was then performed. The tract was then dilated to 8 mm with an 8 mm x 4 cm Athletis balloon. A 8-10 mm by 8 + 2 cm of GORE Viatorr TIPS stent was placed. After placement of the shunt, RIGHT atrial and portal pressures were repeated. Completion portal venogram demonstrates a patent TIPS endograft without significant residual filling of the varices. The RIGHT thoracentesis drain, paracentesis drain, catheters and sheath were removed and manual compression was applied to the RIGHT internal jugular and greater saphenous venous access sites until hemostasis was achieved. The patient was transferred to the PACU in stable condition. Pre-TIPS Mean Pressures (mmHg): Right atrium: 10 Portal vein: 18 Portosystemic gradient: 8 Post-TIPS Mean Pressures (mmHg): Right atrium: 10 Portal vein: 14 Portosystemic gradient: 4 FINDINGS: 1.  Access via the RIGHT jugular and greater saphenous veins. 2. Successful ICE-guided TIPS, via the RIGHT hepatic vein to the RIGHT portal vein, with placement of a 8+2 cm GORE Viatorr stent. 3. Reduction of portosystemic gradient from 8 to 4 mmHg 4. Therapeutic RIGHT thoracentesis  with 2.0 L of serous pleural removed. 5.  Therapeutic paracentesis with 3.9 L of serous ascites removed. IMPRESSION: 1. Successful creation of Transjugular Intrahepatic Portosystemic Shunt (TIPS), with a post-TIPS portosystemic pressure gradient of 4 mmHg. 2. Successful therapeutic RIGHT thoracentesis with drainage of 1.75 L of pleural fluid. 3. Successful therapeutic paracentesis with drainage of 3.9 L of ascites. PLAN: The patient is enrolled in the [facility] portal hypertension follow-up clinic, and will be closely followed per the protocol. Roanna Banning, MD [facility] Radiology Electronically Signed   By: Roanna Banning M.D.   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 MELD 16 (08/09/2023).  Child Pugh class B, 9 points. EXAM: Procedures: 1. TRANSJUGULAR INTRAHEPATIC PORTOSYSTEMIC SHUNT (TIPS) 2. INTRACARDIAC ECHOCARDIOGRAPHY (ICE) 3. THERAPEUTIC RIGHT THORACENTESIS 4. THERAPEUTIC PARACENTESIS Performing Physician: Roanna Banning, MD Assistant(s): Acquanetta Belling, MD A qualified trainee/resident or advanced practice provider (APP) was not immediately available to assist with this case. MEDICATIONS: As antibiotic prophylaxis, Rocephin 2 gm IV was ordered pre-procedure and administered intravenously within one hour of incision. ANESTHESIA/SEDATION: Sedation by the Anesthesia Team was performed. Please see anesthesiology log for details. CONTRAST:  75 mL Omnipaque 300, intravenous FLUOROSCOPY TIME:  Fluoroscopic dose; 80 mGy COMPLICATIONS: None immediate. PROCEDURE: Informed written consent was obtained from the patient and/or patient's representative after  a thorough discussion of the procedural risks, benefits and alternatives. All questions were addressed. Maximal  Sterile Barrier Technique was utilized including caps, mask, sterile gowns, sterile gloves, sterile drape, hand hygiene and skin antiseptic. A timeout was performed prior to the initiation of the procedure. Initial ultrasound scanning demonstrates a large amount of pleural effusion within the RIGHT chest. Under direct ultrasound guidance, a 6 Fr pigtail catheter was introduced. An ultrasound image was saved for documentation purposes. Therapeutic RIGHT thoracentesis was performed. Additional ultrasound scanning of the abdomen demonstrated a large amount of ascites. Under direct ultrasound guidance, a 7 Fr pigtail catheter was introduced. An ultrasound image was saved for documentation purposes. Therapeutic paracentesis was performed. A preliminary ultrasound of the RIGHT groin was performed and demonstrates a patent greater saphenous vein. A permanent ultrasound image was recorded. Using a combination of fluoroscopy and ultrasound, an access site was determined. A small dermatotomy was made at the planned puncture site. Using ultrasound guidance, access into the RIGHT greater saphenous vein was obtained with visualization of needle entry into the vessel using a standard micropuncture technique. A wire was advanced into the IVC insert all fascial dilation performed. An 8 Fr, 10 cm vascular sheath was placed into the external iliac vein. Through this access site, an 8 Fr AcuNav ICE catheter was advanced with ease under fluoroscopic guidance to the level of the intrahepatic inferior vena cava. A preliminary ultrasound of the RIGHT neck was performed and demonstrates a patent internal jugular vein. A permanent ultrasound image was recorded. Using a combination of fluoroscopy and ultrasound, an access site was determined. A small dermatotomy was made at the planned puncture site. Using ultrasound guidance, access into the RIGHT internal jugular vein was obtained with visualization of needle entry into the vessel using a  standard micropuncture technique. A wire was advanced into the IVC and serial fascial dilation performed. A 10 Fr Argon sheath was placed into the internal jugular vein and advanced to the IVC. The jugular sheath was retracted into the RIGHT atrium and manometry was performed. A 5 Fr angled tip catheter was then directed into the RIGHT hepatic vein. Hepatic venogram was performed. These images demonstrated patent hepatic vein with no stenosis. The catheter was advanced to a wedge portion of the a patent vein over which the sheath was advanced into the distal RIGHT hepatic vein. Using ICE ultrasound visualization the catheter as RIGHT hepatic vein as well as the portal anatomy was defined. A planned exit site from the hepatic vein and puncture site from the portal vein was placed into a single sonographic plane. Under direct ultrasound visualization, the Argon Scorpion X needle was advanced into the central RIGHT portal vein. Hand injection of contrast confirmed position within the portal system. A Glidewire was then advanced into the superior mesenteric vein. The tract was pre dilated with a 6 mm mustang balloon. A 5 Fr marking pigtail catheter was then advanced over the wire into the main portal vein and wire removed. Portal venogram was performed which demonstrated a patent portal vein. Portosystemic collaterals esophageal varices were seen arising from the main portal vein. Portal manometry was then performed. The tract was then dilated to 8 mm with an 8 mm x 4 cm Athletis balloon. A 8-10 mm by 8 + 2 cm of GORE Viatorr TIPS stent was placed. After placement of the shunt, RIGHT atrial and portal pressures were repeated. Completion portal venogram demonstrates a patent TIPS endograft without significant residual filling of the varices. The RIGHT  thoracentesis drain, paracentesis drain, catheters and sheath were removed and manual compression was applied to the RIGHT internal jugular and greater saphenous venous  access sites until hemostasis was achieved. The patient was transferred to the PACU in stable condition. Pre-TIPS Mean Pressures (mmHg): Right atrium: 10 Portal vein: 18 Portosystemic gradient: 8 Post-TIPS Mean Pressures (mmHg): Right atrium: 10 Portal vein: 14 Portosystemic gradient: 4 FINDINGS: 1.  Access via the RIGHT jugular and greater saphenous veins. 2. Successful ICE-guided TIPS, via the RIGHT hepatic vein to the RIGHT portal vein, with placement of a 8+2 cm GORE Viatorr stent. 3. Reduction of portosystemic gradient from 8 to 4 mmHg 4. Therapeutic RIGHT thoracentesis with 2.0 L of serous pleural removed. 5.  Therapeutic paracentesis with 3.9 L of serous ascites removed. IMPRESSION: 1. Successful creation of Transjugular Intrahepatic Portosystemic Shunt (TIPS), with a post-TIPS portosystemic pressure gradient of 4 mmHg. 2. Successful therapeutic RIGHT thoracentesis with drainage of 1.75 L of pleural fluid. 3. Successful therapeutic paracentesis with drainage of 3.9 L of ascites. PLAN: The patient is enrolled in the [facility] portal hypertension follow-up clinic, and will be closely followed per the protocol. Roanna Banning, MD [facility] Radiology Electronically Signed   By: Roanna Banning M.D.   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 MELD 16 (08/09/2023).  Child Pugh class B, 9 points. EXAM: Procedures: 1. TRANSJUGULAR INTRAHEPATIC PORTOSYSTEMIC SHUNT (TIPS) 2. INTRACARDIAC ECHOCARDIOGRAPHY (ICE) 3. THERAPEUTIC RIGHT THORACENTESIS 4. THERAPEUTIC PARACENTESIS Performing Physician: Roanna Banning, MD Assistant(s): Acquanetta Belling, MD A qualified trainee/resident or advanced practice provider (APP) was not immediately available to assist with this case. MEDICATIONS: As antibiotic prophylaxis,  Rocephin 2 gm IV was ordered pre-procedure and administered intravenously within one hour of incision. ANESTHESIA/SEDATION: Sedation by the Anesthesia Team was performed. Please see anesthesiology log for details. CONTRAST:  75 mL Omnipaque 300, intravenous FLUOROSCOPY TIME:  Fluoroscopic dose; 80 mGy COMPLICATIONS: None immediate. PROCEDURE: Informed written consent was obtained from the patient and/or patient's representative after a thorough discussion of the procedural risks, benefits and alternatives. All questions were addressed. Maximal Sterile Barrier Technique was utilized including caps, mask, sterile gowns, sterile gloves, sterile drape, hand hygiene and skin antiseptic. A timeout was performed prior to the initiation of the procedure. Initial ultrasound scanning demonstrates a large amount of pleural effusion within the RIGHT chest. Under direct ultrasound guidance, a 6 Fr pigtail catheter was introduced. An ultrasound image was saved for documentation purposes. Therapeutic RIGHT thoracentesis was performed. Additional ultrasound scanning of the abdomen demonstrated a large amount of ascites. Under direct ultrasound guidance, a 7 Fr pigtail catheter was introduced. An ultrasound image was saved for documentation purposes. Therapeutic paracentesis was performed. A preliminary ultrasound of the RIGHT groin was performed and demonstrates a patent greater saphenous vein. A permanent ultrasound image was recorded. Using a combination of fluoroscopy and ultrasound, an access site was determined. A small dermatotomy was made at the planned puncture site. Using ultrasound guidance, access into the RIGHT greater saphenous vein was obtained with visualization of needle entry into the vessel using a standard micropuncture technique. A wire was advanced into the IVC insert all fascial dilation performed. An 8 Fr, 10 cm vascular sheath was placed into the external iliac vein. Through this access site, an 8 Fr AcuNav  ICE catheter was advanced with ease under fluoroscopic guidance to  the level of the intrahepatic inferior vena cava. A preliminary ultrasound of the RIGHT neck was performed and demonstrates a patent internal jugular vein. A permanent ultrasound image was recorded. Using a combination of fluoroscopy and ultrasound, an access site was determined. A small dermatotomy was made at the planned puncture site. Using ultrasound guidance, access into the RIGHT internal jugular vein was obtained with visualization of needle entry into the vessel using a standard micropuncture technique. A wire was advanced into the IVC and serial fascial dilation performed. A 10 Fr Argon sheath was placed into the internal jugular vein and advanced to the IVC. The jugular sheath was retracted into the RIGHT atrium and manometry was performed. A 5 Fr angled tip catheter was then directed into the RIGHT hepatic vein. Hepatic venogram was performed. These images demonstrated patent hepatic vein with no stenosis. The catheter was advanced to a wedge portion of the a patent vein over which the sheath was advanced into the distal RIGHT hepatic vein. Using ICE ultrasound visualization the catheter as RIGHT hepatic vein as well as the portal anatomy was defined. A planned exit site from the hepatic vein and puncture site from the portal vein was placed into a single sonographic plane. Under direct ultrasound visualization, the Argon Scorpion X needle was advanced into the central RIGHT portal vein. Hand injection of contrast confirmed position within the portal system. A Glidewire was then advanced into the superior mesenteric vein. The tract was pre dilated with a 6 mm mustang balloon. A 5 Fr marking pigtail catheter was then advanced over the wire into the main portal vein and wire removed. Portal venogram was performed which demonstrated a patent portal vein. Portosystemic collaterals esophageal varices were seen arising from the main portal vein.  Portal manometry was then performed. The tract was then dilated to 8 mm with an 8 mm x 4 cm Athletis balloon. A 8-10 mm by 8 + 2 cm of GORE Viatorr TIPS stent was placed. After placement of the shunt, RIGHT atrial and portal pressures were repeated. Completion portal venogram demonstrates a patent TIPS endograft without significant residual filling of the varices. The RIGHT thoracentesis drain, paracentesis drain, catheters and sheath were removed and manual compression was applied to the RIGHT internal jugular and greater saphenous venous access sites until hemostasis was achieved. The patient was transferred to the PACU in stable condition. Pre-TIPS Mean Pressures (mmHg): Right atrium: 10 Portal vein: 18 Portosystemic gradient: 8 Post-TIPS Mean Pressures (mmHg): Right atrium: 10 Portal vein: 14 Portosystemic gradient: 4 FINDINGS: 1.  Access via the RIGHT jugular and greater saphenous veins. 2. Successful ICE-guided TIPS, via the RIGHT hepatic vein to the RIGHT portal vein, with placement of a 8+2 cm GORE Viatorr stent. 3. Reduction of portosystemic gradient from 8 to 4 mmHg 4. Therapeutic RIGHT thoracentesis with 2.0 L of serous pleural removed. 5.  Therapeutic paracentesis with 3.9 L of serous ascites removed. IMPRESSION: 1. Successful creation of Transjugular Intrahepatic Portosystemic Shunt (TIPS), with a post-TIPS portosystemic pressure gradient of 4 mmHg. 2. Successful therapeutic RIGHT thoracentesis with drainage of 1.75 L of pleural fluid. 3. Successful therapeutic paracentesis with drainage of 3.9 L of ascites. PLAN: The patient is enrolled in the [facility] portal hypertension follow-up clinic, and will be closely followed per the protocol. Roanna Banning, MD [facility] Radiology Electronically Signed   By: Roanna Banning M.D.   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 MELD 16 (08/09/2023).  Child Pugh class B, 9 points. EXAM: Procedures: 1. TRANSJUGULAR INTRAHEPATIC PORTOSYSTEMIC SHUNT (TIPS) 2. INTRACARDIAC ECHOCARDIOGRAPHY (ICE) 3. THERAPEUTIC RIGHT THORACENTESIS 4. THERAPEUTIC PARACENTESIS Performing Physician: Roanna Banning, MD Assistant(s): Acquanetta Belling, MD A qualified trainee/resident or advanced practice provider (APP) was not immediately available to assist with this case. MEDICATIONS: As antibiotic prophylaxis, Rocephin 2 gm IV was ordered pre-procedure and administered intravenously within one hour of incision. ANESTHESIA/SEDATION: Sedation by the Anesthesia Team was performed. Please see anesthesiology log for details. CONTRAST:  75 mL Omnipaque 300, intravenous FLUOROSCOPY TIME:  Fluoroscopic dose; 80 mGy COMPLICATIONS: None immediate. PROCEDURE: Informed written consent was obtained from the patient and/or patient's representative after a thorough discussion of the procedural risks, benefits and alternatives. All questions were addressed. Maximal Sterile Barrier Technique was utilized including caps, mask, sterile gowns, sterile gloves, sterile drape, hand hygiene and skin antiseptic. A timeout was performed prior to the initiation of the procedure. Initial ultrasound scanning demonstrates a large amount of pleural effusion within the RIGHT chest. Under direct ultrasound guidance, a 6 Fr pigtail catheter was introduced. An ultrasound image was saved for documentation purposes. Therapeutic RIGHT thoracentesis was performed. Additional ultrasound scanning of the abdomen demonstrated a large amount of ascites. Under direct ultrasound guidance, a 7 Fr pigtail catheter was introduced. An ultrasound image was saved for documentation purposes. Therapeutic paracentesis was performed. A preliminary ultrasound of the RIGHT groin was performed and demonstrates a patent greater  saphenous vein. A permanent ultrasound image was recorded. Using a combination of fluoroscopy and ultrasound, an access site was determined. A small dermatotomy was made at the planned puncture site. Using ultrasound guidance, access into the RIGHT greater saphenous vein was obtained with visualization of needle entry into the vessel using a standard micropuncture technique. A wire was advanced into the IVC insert all fascial dilation performed. An 8 Fr, 10 cm vascular sheath was placed into the external iliac vein. Through this access site, an 8 Fr AcuNav ICE catheter was advanced with ease under fluoroscopic guidance to the level of the intrahepatic inferior vena cava. A preliminary ultrasound of the RIGHT neck was performed and demonstrates a patent internal jugular vein. A permanent ultrasound image was recorded. Using a combination of fluoroscopy and ultrasound, an access site was determined. A small dermatotomy was made at the planned puncture site. Using ultrasound guidance, access into the RIGHT internal jugular vein was obtained with visualization of needle entry into the vessel using a standard micropuncture technique. A wire was advanced into the IVC and serial fascial dilation performed. A 10 Fr Argon sheath was placed into the internal jugular vein and advanced to the IVC. The jugular sheath was retracted into the RIGHT atrium and manometry was performed. A 5 Fr angled tip catheter was then directed into the RIGHT hepatic vein. Hepatic venogram was performed. These images demonstrated patent hepatic vein with no stenosis. The catheter was advanced to a wedge portion of the a patent vein over which the sheath was advanced into the distal RIGHT hepatic vein. Using ICE ultrasound visualization the catheter as RIGHT hepatic vein as well as the portal anatomy was defined. A planned exit site from the hepatic vein and puncture site from the portal vein was placed into a single sonographic plane. Under direct  ultrasound visualization, the Argon Scorpion X needle was advanced into the central RIGHT portal vein.  Hand injection of contrast confirmed position within the portal system. A Glidewire was then advanced into the superior mesenteric vein. The tract was pre dilated with a 6 mm mustang balloon. A 5 Fr marking pigtail catheter was then advanced over the wire into the main portal vein and wire removed. Portal venogram was performed which demonstrated a patent portal vein. Portosystemic collaterals esophageal varices were seen arising from the main portal vein. Portal manometry was then performed. The tract was then dilated to 8 mm with an 8 mm x 4 cm Athletis balloon. A 8-10 mm by 8 + 2 cm of GORE Viatorr TIPS stent was placed. After placement of the shunt, RIGHT atrial and portal pressures were repeated. Completion portal venogram demonstrates a patent TIPS endograft without significant residual filling of the varices. The RIGHT thoracentesis drain, paracentesis drain, catheters and sheath were removed and manual compression was applied to the RIGHT internal jugular and greater saphenous venous access sites until hemostasis was achieved. The patient was transferred to the PACU in stable condition. Pre-TIPS Mean Pressures (mmHg): Right atrium: 10 Portal vein: 18 Portosystemic gradient: 8 Post-TIPS Mean Pressures (mmHg): Right atrium: 10 Portal vein: 14 Portosystemic gradient: 4 FINDINGS: 1.  Access via the RIGHT jugular and greater saphenous veins. 2. Successful ICE-guided TIPS, via the RIGHT hepatic vein to the RIGHT portal vein, with placement of a 8+2 cm GORE Viatorr stent. 3. Reduction of portosystemic gradient from 8 to 4 mmHg 4. Therapeutic RIGHT thoracentesis with 2.0 L of serous pleural removed. 5.  Therapeutic paracentesis with 3.9 L of serous ascites removed. IMPRESSION: 1. Successful creation of Transjugular Intrahepatic Portosystemic Shunt (TIPS), with a post-TIPS portosystemic pressure gradient of 4 mmHg.  2. Successful therapeutic RIGHT thoracentesis with drainage of 1.75 L of pleural fluid. 3. Successful therapeutic paracentesis with drainage of 3.9 L of ascites. PLAN: The patient is enrolled in the [facility] portal hypertension follow-up clinic, and will be closely followed per the protocol. Roanna Banning, MD [facility] Radiology Electronically Signed   By: Roanna Banning M.D.   On: 08/10/2023 17:01   DG CHEST PORT 1 VIEW  Result Date: 08/10/2023 CLINICAL DATA:  Cirrhosis EXAM: PORTABLE CHEST 1 VIEW COMPARISON:  Chest x-ray 08/09/2023 FINDINGS: Right pleural effusion is moderate in size, but has decreased. Central pulmonary vascular congestion and perihilar opacities persist. Minimal patchy mid and lower lung opacities are stable. No pneumothorax. Cardiomediastinal silhouette is within normal limits. No acute fractures are seen. IMPRESSION: 1. Moderate right pleural effusion has decreased. 2. Stable central pulmonary vascular congestion and bilateral airspace disease. Electronically Signed   By: Darliss Cheney M.D.   On: 08/10/2023 15:13   DG Chest Portable 1 View  Result Date: 08/09/2023 CLINICAL DATA:  Postop. EXAM: PORTABLE CHEST 1 VIEW COMPARISON:  Chest x-ray 08/07/2023 FINDINGS: Enlarged cardiopericardial silhouette. Calcified aorta. Decreasing right effusion compared to prior x-ray. There is diffuse heterogeneous opacity involving the right hemithorax, overall increased from previous. There is also increasing parenchymal opacity left mid to lower lung. No pneumothorax. Calcified aorta. Tips shunt in the right upper quadrant. Embolization material left upper quadrant overlapping cardiac leads. IMPRESSION: Improving right effusion with residual. Increasing bilateral lung opacities. No pneumothorax. Recommend follow-up Electronically Signed   By: Karen Kays M.D.   On: 08/09/2023 18:50   CT ANGIO ABD/PELVIS BRTO  Result Date: 08/09/2023 CLINICAL DATA:   Portal hypertension EXAM: CTA ABDOMEN AND PELVIS WITHOUT AND WITH CONTRAST TECHNIQUE: Multidetector CT imaging of the abdomen and  pelvis was performed using the standard protocol during bolus administration of intravenous contrast. Multiplanar reconstructed images and MIPs were obtained and reviewed to evaluate the vascular anatomy. RADIATION DOSE REDUCTION: This exam was performed according to the departmental dose-optimization program which includes automated exposure control, adjustment of the mA and/or kV according to patient size and/or use of iterative reconstruction technique. CONTRAST:  OMNIPAQUE IOHEXOL 350 MG/ML SOLN COMPARISON:  CT AP 05/22/2021 and 05/20/2021.  IR BRTO 05/21/2021. FINDINGS: VASCULAR Arterial: Mild burden of aortoiliac atherosclerosis. Widely patent celiac axis, SMA, IMA, renal and pelvic arteries. No aneurysmal dilatation, dissection, vasculitis or significant stenosis. Venous: Prior coil embolization of gastric varices, with dense coil pack at the LEFT quadrant. RIGHT paraesophageal varices. Widely patent IVC, hepatic veins, portal, SMV and splenic veins. Review of the MIP images confirms the above findings. NON-VASCULAR Lower chest: Mitral valve calcifications. At least moderate volume RIGHT pleural effusion. LEFT lung is comparatively clear, with trace linear basilar opacities. Hepatobiliary: Shrunken liver with coarse nodular contour. No discrete focal hepatic lesion. Hepatomegaly, with the RIGHT lobe measuring 19 cm. Cholecystectomy. No biliary dilatation. Pancreas: No pancreatic ductal dilatation or surrounding inflammatory changes. Spleen: Splenomegaly, measuring up to 19.5 cm Adrenals/Urinary Tract: Adrenal glands are unremarkable. 1.4 cm RIGHT midpolar simple renal cyst measuring 19 HU. Kidneys are otherwise normal, without renal calculi or hydronephrosis. Bladder is decompressed. Stomach/Bowel: Stomach is within normal limits. Appendix appears normal. Nonobstructed small  bowel. Nondilated colon. Mild generalized bowel thickening. No evidence of bowel distention, or inflammatory change. Lymphatic: No enlarged abdominal or pelvic lymph nodes. Reproductive: Hysterectomy. No adnexal mass. Other: No abdominal wall hernia. Moderate volume of abdominopelvic ascites. Musculoskeletal: Diffuse moderate body wall edema. Multilevel degenerative of the spine, including vacuum disc phenomenon at the lower lumbar spine, and chronic-appearing L3 superior endplate compression deformity. No acute osseous findings. IMPRESSION: VASCULAR 1. Patent IVC, hepatic and portal veins. 2. Coil embolization of prior gastric varices without evidence of residual. RIGHT paraesophageal varices. 3.  Aortic Atherosclerosis (ICD10-I70.0). NON-VASCULAR 1. Cirrhosis with hepatosplenomegaly, and stigmata of portal venous hypertension. 2. Moderate-to-large volume RIGHT pleural effusion and abdominopelvic ascites. 3. Mild generalized bowel thickening, favored consistent with portal enteropathy. 4. 1.4 cm RIGHT Bosniak I benign renal cyst. No follow-up imaging is recommended. JACR 2018 Feb; 264-273, Management of the Incidental Renal Mass on CT, [employer] 2021; 814-848, Bosniak Classification of Cystic Renal Masses, Version 2019 5.  Additional incidental, chronic and senescent findings as above. Roanna Banning, MD [facility] Radiology Electronically Signed   By: Roanna Banning M.D.   On: 08/09/2023 07:44   ECHOCARDIOGRAM COMPLETE  Result Date: 08/08/2023    ECHOCARDIOGRAM REPORT   Patient Name:   Ariel Arnold Date of Exam: 08/08/2023 Medical Rec #:  [id number]       Height:       68.0 in Accession #:    [id number]      Weight:       182.1 lb Date of Birth:  [birth year]        BSA:          1.964 m Patient Age:    67 years        BP:           115/56 mmHg Patient Gender: F               HR:           76 bpm. Exam Location:  Inpatient Procedure: 2D Echo, Cardiac Doppler  and  Color Doppler Indications:    Preoperative evaluation  History:        Patient has prior history of Echocardiogram examinations, most                 recent 06/07/2022. CAD; Risk Factors:Hypertension, Diabetes and                 Dyslipidemia. CKD, stage 2.  Sonographer:    Lucendia Herrlich Referring Phys: [id number] JON MUGWERU IMPRESSIONS  1. Left ventricular ejection fraction, by estimation, is 60 to 65%. The left ventricle has normal function. The left ventricle has no regional wall motion abnormalities. There is mild left ventricular hypertrophy of the basal-septal segment. Left ventricular diastolic parameters are indeterminate.  2. Right ventricular systolic function is normal. The right ventricular size is normal. There is normal pulmonary artery systolic pressure. The estimated right ventricular systolic pressure is 26.0 mmHg.  3. The mitral valve is degenerative. No evidence of mitral valve regurgitation. Mild mitral stenosis. The mean mitral valve gradient is 3.5 mmHg. Moderate mitral annular calcification.  4. The aortic valve is tricuspid. Aortic valve regurgitation is not visualized. Aortic valve sclerosis is present, with no evidence of aortic valve stenosis. Aortic valve Vmax measures 1.38 m/s.  5. The inferior vena cava is normal in size with greater than 50% respiratory variability, suggesting right atrial pressure of 3 mmHg. FINDINGS  Left Ventricle: Left ventricular ejection fraction, by estimation, is 60 to 65%. The left ventricle has normal function. The left ventricle has no regional wall motion abnormalities. The left ventricular internal cavity size was normal in size. There is  mild left ventricular hypertrophy of the basal-septal segment. Left ventricular diastolic parameters are indeterminate. Normal left ventricular filling pressure. Right Ventricle: The right ventricular size is normal. No increase in right ventricular wall thickness. Right ventricular systolic function is normal. There is  normal pulmonary artery systolic pressure. The tricuspid regurgitant velocity is 2.40 m/s, and  with an assumed right atrial pressure of 3 mmHg, the estimated right ventricular systolic pressure is 26.0 mmHg. Left Atrium: Left atrial size was normal in size. Right Atrium: Right atrial size was normal in size. Pericardium: There is no evidence of pericardial effusion. Mitral Valve: The mitral valve is degenerative in appearance. Moderate mitral annular calcification. No evidence of mitral valve regurgitation. Mild mitral valve stenosis. MV peak gradient, 6.7 mmHg. The mean mitral valve gradient is 3.5 mmHg. Tricuspid Valve: The tricuspid valve is normal in structure. Tricuspid valve regurgitation is mild . No evidence of tricuspid stenosis. Aortic Valve: The aortic valve is tricuspid. Aortic valve regurgitation is not visualized. Aortic valve sclerosis is present, with no evidence of aortic valve stenosis. Aortic valve peak gradient measures 7.6 mmHg. Pulmonic Valve: The pulmonic valve was normal in structure. Pulmonic valve regurgitation is trivial. No evidence of pulmonic stenosis. Aorta: The aortic root is normal in size and structure. Venous: The inferior vena cava is normal in size with greater than 50% respiratory variability, suggesting right atrial pressure of 3 mmHg. IAS/Shunts: No atrial level shunt detected by color flow Doppler.  LEFT VENTRICLE PLAX 2D LVIDd:         4.40 cm   Diastology LVIDs:         2.80 cm   LV e' medial:    6.10 cm/s LV PW:         1.00 cm   LV E/e' medial:  14.9 LV IVS:        1.20 cm  LV e' lateral:   11.60 cm/s LVOT diam:     2.10 cm   LV E/e' lateral: 7.8 LV SV:         53 LV SV Index:   27 LVOT Area:     3.46 cm  RIGHT VENTRICLE             IVC RV S prime:     14.10 cm/s  IVC diam: 1.60 cm TAPSE (M-mode): 2.4 cm LEFT ATRIUM             Index        RIGHT ATRIUM          Index LA diam:        4.30 cm 2.19 cm/m   RA Area:     9.13 cm LA Vol (A2C):   43.3 ml 22.05 ml/m  RA  Volume:   15.90 ml 8.10 ml/m LA Vol (A4C):   40.0 ml 20.37 ml/m LA Biplane Vol: 41.4 ml 21.08 ml/m  AORTIC VALVE AV Area (Vmax): 2.04 cm AV Vmax:        138.00 cm/s AV Peak Grad:   7.6 mmHg LVOT Vmax:      81.47 cm/s LVOT Vmean:     52.067 cm/s LVOT VTI:       0.154 m  AORTA Ao Root diam: 3.30 cm Ao Asc diam:  3.30 cm MITRAL VALVE                TRICUSPID VALVE MV Area (PHT): 2.99 cm     TR Peak grad:   23.0 mmHg MV Area VTI:   1.33 cm     TR Vmax:        240.00 cm/s MV Peak grad:  6.7 mmHg MV Mean grad:  3.5 mmHg     SHUNTS MV Vmax:       1.29 m/s     Systemic VTI:  0.15 m MV Vmean:      85.6 cm/s    Systemic Diam: 2.10 cm MV Decel Time: 254 msec MR Peak grad: 15.5 mmHg MR Vmax:      197.00 cm/s MV E velocity: 90.80 cm/s MV A velocity: 109.00 cm/s MV E/A ratio:  0.83 Armanda Magic MD Electronically signed by Armanda Magic MD Signature Date/Time: 08/08/2023/4:51:49 PM    Final    DG CHEST PORT 1 VIEW  Result Date: 08/07/2023 CLINICAL DATA:  Recurrent right pleural effusion.  [id number]. EXAM: PORTABLE CHEST 1 VIEW COMPARISON:  Portable chest 08/05/2023 FINDINGS: 4:58 p.m. A large right pleural effusion with apical extension and possible partial apical loculation is again noted, slightly increased in size. There is a small portion of the perihilar right upper lung field remaining aerated, less than previously with linear atelectasis and hazy opacity. There is no substantial left pleural effusion. Interstitial and patchy opacities of the left mid and lower lung fields appear similar with perihilar linear atelectatic bands. There is mild cardiomegaly. The aortic arch is heavily calcified. Central vessels appear normal in caliber. IMPRESSION: 1. Large right pleural effusion with apical extension and possible partial apical loculation, slightly increased in size. 2. Small portion of the perihilar right upper lung field remaining aerated, less than previously. 3. Interstitial and patchy opacities of the left mid and  lower lung fields appear similar. 4. Mild cardiomegaly. 5. Aortic atherosclerosis. Electronically Signed   By: Almira Bar M.D.   On: 08/07/2023 20:52    Labs:  CBC: Recent Labs    08/11/23 0511 08/13/23  0411 08/14/23 0416 08/15/23 0319  WBC 5.1 5.5 3.2* 3.3*  HGB 7.9* 8.9* 7.4* 8.2*  HCT 23.8* 27.6* 22.7* 25.4*  PLT 65* 87* 60* 63*    COAGS: Recent Labs    08/08/23 0309 08/09/23 0111  INR 1.5* 1.5*    BMP: Recent Labs    08/12/23 0913 08/13/23 0411 08/14/23 0416 08/15/23 0319  NA 138 139 139 139  K 3.2* 4.1 3.4* 4.0  CL 104 107 106 106  CO2 26 24 28 26   GLUCOSE 158* 162* 138* 165*  BUN 33* 33* 35* 28*  CALCIUM 8.0* 8.2* 8.1* 8.0*  CREATININE 1.16* 1.25* 1.17* 1.04*  GFRNONAA 51* 47* 51* 58*    LIVER FUNCTION TESTS: Recent Labs    08/08/23 0309 08/09/23 0111 08/10/23 0359 08/14/23 0416  BILITOT 1.4* 1.1 2.7* 1.9*  AST 21 18 23 28   ALT 15 15 15 15   ALKPHOS 76 75 60 56  PROT 6.0* 5.8* 4.8* 4.9*  ALBUMIN 2.2* 2.1* 1.9* 2.4*    Assessment and Plan:  67 y.o. female inpatient. History of DM, anemia, osteomyelitis s/p Left BKA, decompensated NASH Cirrhosis with portal hypertension, GI bleed, gastric varices, esophageal varices, hepatic encephalopathy and hepatic hydrothorax. Patient was found to have a MELD of 15 and a Child Pugh Class B. On 8.21.24 IR Attending Dr. Roanna Banning performed a TIPS with a *=2 GORE Viatorr stent placement a paracentesis and a right sided thoracentesis. Team concerned for persistent hepatic hydrothorax. Team is requesting a TIPS evaluation possible revision.   WBC 3.3. Hgb 8.2, PLT 63, BUN 28, Cr 1.04, GFR < 58, Ammonia ( 8.24.24) 31. Patient is on subcutaneous prophylactic dose of lovenox. Allergies include Tape   IR consulted for possible TIPS evaluation possible revision. Case has been reviewed and procedure approved by Dr. Marliss Coots.  Patient tentatively scheduled for 8.28.28.  Team instructed to: Keep Patient to be  NPO after midnight Hold prophylactic anticoagulation 24 hours prior to scheduled procedure.  IR will call patient when ready.  Risks and benefits of TIPS, BRTO and/or additional variceal embolization were discussed with the patient and/or the patient's family including, but not limited to, infection, bleeding, damage to adjacent structures, worsening hepatic and/or cardiac function, worsening and/or the development of altered mental status/encephalopathy, non-target embolization and death.   This interventional procedure involves the use of X-rays and because of the nature of the planned procedure, it is possible that we will have prolonged use of X-ray fluoroscopy.  Potential radiation risks to you include (but are not limited to) the following: - A slightly elevated risk for cancer  several years later in life. This risk is typically less than 0.5% percent. This risk is low in comparison to the normal incidence of human cancer, which is 33% for women and 50% for men according to the [employer]. - Radiation induced injury can include skin redness, resembling a rash, tissue breakdown / ulcers and hair loss (which can be temporary or permanent).   The likelihood of either of these occurring depends on the difficulty of the procedure and whether you are sensitive to radiation due to previous procedures, disease, or genetic conditions.   IF your procedure requires a prolonged use of radiation, you will be notified and given written instructions for further action.  It is your responsibility to monitor the irradiated area for the 2 weeks following the procedure and to notify your physician if you are concerned that you have suffered a radiation induced injury.  All of the patient's questions were answered, patient is agreeable to proceed.  Consent signed and in chart.     Thank you for this interesting consult.  I greatly enjoyed meeting SAMANATHA KERNAGHAN and look forward to  participating in their care.  A copy of this report was sent to the requesting provider on this date.  Electronically Signed: Alene Mires, NP 08/15/2023, 3:22 PM   I spent a total of 20 Minutes    in face to face in clinical consultation, greater than 50% of which was counseling/coordinating care for TIPS Evaluation and possible revision

## 2023-08-15 NOTE — Plan of Care (Signed)
Patient ID: SUANY RETTER, female   DOB: [birth year], 67 y.o.   MRN: [id number]  Problem: Education: Goal: Knowledge of General Education information will improve Description: Including pain rating scale, medication(s)/side effects and non-pharmacologic comfort measures Outcome: Progressing   Problem: Health Behavior/Discharge Planning: Goal: Ability to manage health-related needs will improve Outcome: Progressing   Problem: Clinical Measurements: Goal: Ability to maintain clinical measurements within normal limits will improve Outcome: Progressing Goal: Will remain free from infection Outcome: Progressing Goal: Diagnostic test results will improve Outcome: Progressing Goal: Respiratory complications will improve Outcome: Progressing Goal: Cardiovascular complication will be avoided Outcome: Progressing   Problem: Activity: Goal: Risk for activity intolerance will decrease Outcome: Progressing   Problem: Nutrition: Goal: Adequate nutrition will be maintained Outcome: Progressing   Problem: Coping: Goal: Level of anxiety will decrease Outcome: Progressing   Problem: Elimination: Goal: Will not experience complications related to bowel motility Outcome: Progressing Goal: Will not experience complications related to urinary retention Outcome: Progressing   Problem: Pain Managment: Goal: General experience of comfort will improve Outcome: Progressing   Problem: Safety: Goal: Ability to remain free from injury will improve Outcome: Progressing   Problem: Skin Integrity: Goal: Risk for impaired skin integrity will decrease Outcome: Progressing   Problem: Education: Goal: Ability to describe self-care measures that may prevent or decrease complications (Diabetes Survival Skills Education) will improve Outcome: Progressing Goal: Individualized Educational Video(s) Outcome: Progressing   Problem: Coping: Goal: Ability to adjust to condition or change in health will  improve Outcome: Progressing   Problem: Fluid Volume: Goal: Ability to maintain a balanced intake and output will improve Outcome: Progressing   Problem: Health Behavior/Discharge Planning: Goal: Ability to identify and utilize available resources and services will improve Outcome: Progressing Goal: Ability to manage health-related needs will improve Outcome: Progressing   Problem: Metabolic: Goal: Ability to maintain appropriate glucose levels will improve Outcome: Progressing   Problem: Nutritional: Goal: Maintenance of adequate nutrition will improve Outcome: Progressing Goal: Progress toward achieving an optimal weight will improve Outcome: Progressing   Problem: Skin Integrity: Goal: Risk for impaired skin integrity will decrease Outcome: Progressing   Problem: Tissue Perfusion: Goal: Adequacy of tissue perfusion will improve Outcome: Progressing   Problem: Education: Goal: Understanding of CV disease, CV risk reduction, and recovery process will improve Outcome: Progressing Goal: Individualized Educational Video(s) Outcome: Progressing   Problem: Activity: Goal: Ability to return to baseline activity level will improve Outcome: Progressing   Problem: Cardiovascular: Goal: Ability to achieve and maintain adequate cardiovascular perfusion will improve Outcome: Progressing Goal: Vascular access site(s) Level 0-1 will be maintained Outcome: Progressing   Problem: Health Behavior/Discharge Planning: Goal: Ability to safely manage health-related needs after discharge will improve Outcome: Progressing    Lidia Collum, RN

## 2023-08-16 ENCOUNTER — Inpatient Hospital Stay (HOSPITAL_COMMUNITY): Payer: Medicare HMO

## 2023-08-16 ENCOUNTER — Ambulatory Visit: Payer: Medicare Other | Admitting: Family

## 2023-08-16 DIAGNOSIS — K7290 Hepatic failure, unspecified without coma: Secondary | ICD-10-CM | POA: Diagnosis not present

## 2023-08-16 DIAGNOSIS — K7460 Unspecified cirrhosis of liver: Secondary | ICD-10-CM | POA: Diagnosis not present

## 2023-08-16 DIAGNOSIS — J810 Acute pulmonary edema: Secondary | ICD-10-CM | POA: Diagnosis not present

## 2023-08-16 DIAGNOSIS — J9601 Acute respiratory failure with hypoxia: Secondary | ICD-10-CM | POA: Diagnosis not present

## 2023-08-16 HISTORY — PX: IR US GUIDE VASC ACCESS RIGHT: IMG2390

## 2023-08-16 HISTORY — PX: IR THORACENTESIS ASP PLEURAL SPACE W/IMG GUIDE: IMG5380

## 2023-08-16 HISTORY — PX: IR TIPS REVISION MOD SED: IMG2296

## 2023-08-16 LAB — GLUCOSE, CAPILLARY
Glucose-Capillary: 104 mg/dL — ABNORMAL HIGH (ref 70–99)
Glucose-Capillary: 119 mg/dL — ABNORMAL HIGH (ref 70–99)
Glucose-Capillary: 142 mg/dL — ABNORMAL HIGH (ref 70–99)
Glucose-Capillary: 144 mg/dL — ABNORMAL HIGH (ref 70–99)
Glucose-Capillary: 180 mg/dL — ABNORMAL HIGH (ref 70–99)
Glucose-Capillary: 250 mg/dL — ABNORMAL HIGH (ref 70–99)
Glucose-Capillary: 81 mg/dL (ref 70–99)
Glucose-Capillary: 82 mg/dL (ref 70–99)

## 2023-08-16 LAB — COMPREHENSIVE METABOLIC PANEL
ALT: 17 U/L (ref 0–44)
AST: 30 U/L (ref 15–41)
Albumin: 2.6 g/dL — ABNORMAL LOW (ref 3.5–5.0)
Alkaline Phosphatase: 70 U/L (ref 38–126)
Anion gap: 5 (ref 5–15)
BUN: 21 mg/dL (ref 8–23)
CO2: 30 mmol/L (ref 22–32)
Calcium: 8.1 mg/dL — ABNORMAL LOW (ref 8.9–10.3)
Chloride: 105 mmol/L (ref 98–111)
Creatinine, Ser: 0.94 mg/dL (ref 0.44–1.00)
GFR, Estimated: >60 mL/min (ref >60)
Glucose, Bld: 96 mg/dL (ref 70–99)
Potassium: 3.6 mmol/L (ref 3.5–5.1)
Sodium: 140 mmol/L (ref 135–145)
Total Bilirubin: 1 mg/dL (ref 0.3–1.2)
Total Protein: 5.3 g/dL — ABNORMAL LOW (ref 6.5–8.1)

## 2023-08-16 LAB — CBC
HCT: 25.4 % — ABNORMAL LOW (ref 36.0–46.0)
Hemoglobin: 8.2 g/dL — ABNORMAL LOW (ref 12.0–15.0)
MCH: 32 pg (ref 26.0–34.0)
MCHC: 32.3 g/dL (ref 30.0–36.0)
MCV: 99.2 fL (ref 80.0–100.0)
Platelets: 65 10*3/uL — ABNORMAL LOW (ref 150–400)
RBC: 2.56 MIL/uL — ABNORMAL LOW (ref 3.87–5.11)
RDW: 15.4 % (ref 11.5–15.5)
WBC: 3.1 10*3/uL — ABNORMAL LOW (ref 4.0–10.5)
nRBC: 0 % (ref 0.0–0.2)

## 2023-08-16 LAB — PROTIME-INR
INR: 1.8 — ABNORMAL HIGH (ref 0.8–1.2)
Prothrombin Time: 21.5 seconds — ABNORMAL HIGH (ref 11.4–15.2)

## 2023-08-16 MED ORDER — LIDOCAINE HCL (PF) 1 % IJ SOLN
20.0000 mL | Freq: Once | INTRAMUSCULAR | Status: AC
  Administered 2023-08-16: 10 mL via INTRADERMAL
  Filled 2023-08-16: qty 20

## 2023-08-16 MED ORDER — MIDAZOLAM HCL 2 MG/2ML IJ SOLN
INTRAMUSCULAR | Status: AC
  Filled 2023-08-16: qty 2

## 2023-08-16 MED ORDER — LIDOCAINE HCL (PF) 1 % IJ SOLN
INTRAMUSCULAR | Status: AC
  Filled 2023-08-16: qty 30

## 2023-08-16 MED ORDER — MIDAZOLAM HCL 2 MG/2ML IJ SOLN
INTRAMUSCULAR | Status: AC | PRN
Start: 2023-08-16 — End: 2023-08-16
  Administered 2023-08-16: .5 mg via INTRAVENOUS

## 2023-08-16 MED ORDER — IOHEXOL 300 MG/ML  SOLN
150.0000 mL | Freq: Once | INTRAMUSCULAR | Status: AC | PRN
  Administered 2023-08-16: 30 mL via INTRAVENOUS

## 2023-08-16 MED ORDER — FENTANYL CITRATE (PF) 100 MCG/2ML IJ SOLN
INTRAMUSCULAR | Status: AC | PRN
Start: 2023-08-16 — End: 2023-08-16
  Administered 2023-08-16: 25 ug via INTRAVENOUS

## 2023-08-16 MED ORDER — LIDOCAINE HCL 1 % IJ SOLN
INTRAMUSCULAR | Status: AC
  Filled 2023-08-16: qty 20

## 2023-08-16 MED ORDER — FENTANYL CITRATE (PF) 100 MCG/2ML IJ SOLN
INTRAMUSCULAR | Status: AC
  Filled 2023-08-16: qty 2

## 2023-08-16 NOTE — Sedation Documentation (Signed)
Pre Portal pressure: 17 mm Hg Pre right atrium pressure 7 mm Hg

## 2023-08-16 NOTE — Progress Notes (Signed)
Occupational Therapy Treatment Patient Details Name: Ariel Arnold MRN: [id number] DOB: 02-20-1956 Today's Date: 08/16/2023   History of present illness 67 yo female admitted to [facility] 8/13 with SOB with Rt pleural effusion. 8/14 & 8/16 thoracentesis. 8/19 transfer to [facility]. 8/21 TIPS procedure with post procedure hypotension. Plan for thoracentesis 8/23. PMHx; NASH cirrhosis with ascites, T2DM, HTN, PVD, osteomyelitis, depression, anxiety, Lt BKA   OT comments  Patient supine in bed and agreeable to participate in OT intervention.  Patient completed supine to sitting EOB with min A with HOB elevated.  Sit to stand completed with mod A using RW. Patient unable to attempt stand pivot to [facility] so returned to sitting EB and completed UB grooming task with Supervision s/p setup of  ADL task.  Patient returned to supine in bed with CGA and completed rolling to R with minA for bedpan placement, patient unable to void so bedpan removed.  Patient left supine in bed with all needs within reach.  Patient would benefit from additional OT intervention to address functional deficits in order for patient to return to PLOF.      If plan is discharge home, recommend the following:  A lot of help with walking and/or transfers;A lot of help with bathing/dressing/bathroom   Equipment Recommendations  None recommended by OT    Recommendations for Other Services      Precautions / Restrictions Precautions Precautions: Fall;Other (comment) Precaution Comments: Lt BKA Restrictions Weight Bearing Restrictions: Yes Other Position/Activity Restrictions: hx of L BKA       Mobility Bed Mobility Overal bed mobility: Needs Assistance Bed Mobility: Supine to Sit Rolling: Min assist (with HOB elevated and use fo bed rails)   Supine to sit: Min assist, HOB elevated, Used rails     General bed mobility comments: supervision for safety vc for sequencing to scoot herself to the EoB    Transfers Overall  transfer level: Needs assistance Equipment used: Rolling walker (2 wheels) Transfers: Sit to/from Stand Sit to Stand: Mod assist Stand pivot transfers:  (patient reported increased fatigue and unable to safely complete)               Balance Overall balance assessment: Needs assistance         Standing balance support: Bilateral upper extremity supported                               ADL either performed or assessed with clinical judgement   ADL Overall ADL's : Needs assistance/impaired Eating/Feeding: Set up   Grooming: Oral care;Wash/dry face;Wash/dry hands;Set up;Sitting                   Toilet Transfer: Minimal assistance (bed pan used 2/2 patient unable to tolerate sit to stand from bed to complete stand pivot to [facility] 2/2 patient reported fatigue)   Toileting- Clothing Manipulation and Hygiene: Total assistance;Bed level       Functional mobility during ADLs: Moderate assistance;Maximal assistance General ADL Comments: for sit to stand, patient unable to tolerate standing to attempt stand pivot transfer to [facility]    Extremity/Trunk Assessment Upper Extremity Assessment Upper Extremity Assessment: Generalized weakness            Vision   Vision Assessment?: No apparent visual deficits   Perception     Praxis      Cognition Arousal: Alert Behavior During Therapy: Flat affect  Problem Solving: Slow processing General Comments: awake and able to participate in therapy today,        Exercises      Shoulder Instructions       General Comments      Pertinent Vitals/ Pain       Pain Assessment Pain Assessment: No/denies pain Pain Score: 0-No pain  Home Living                                          Prior Functioning/Environment              Frequency  Min 1X/week        Progress Toward Goals  OT Goals(current goals can now be found in the care  plan section)  Progress towards OT goals: Progressing toward goals  Acute Rehab OT Goals OT Goal Formulation: With patient Time For Goal Achievement: 09/01/23 Potential to Achieve Goals: Fair ADL Goals Pt Will Perform Grooming: with set-up;sitting Pt Will Perform Upper Body Dressing: with min assist;sitting Pt Will Perform Lower Body Dressing: with mod assist;sit to/from stand;sitting/lateral leans Pt Will Transfer to Toilet: with min assist;squat pivot transfer;stand pivot transfer;bedside commode Additional ADL Goal #1: P to complete bed mobility with Min A in prep for OOB ADL  Plan      Co-evaluation                 AM-PAC OT "6 Clicks" Daily Activity     Outcome Measure   Help from another person eating meals?: A Little Help from another person taking care of personal grooming?: A Little Help from another person toileting, which includes using toliet, bedpan, or urinal?: A Lot   Help from another person to put on and taking off regular upper body clothing?: A Lot Help from another person to put on and taking off regular lower body clothing?: Total 6 Click Score: 11    End of Session Equipment Utilized During Treatment: Oxygen  OT Visit Diagnosis: Unsteadiness on feet (R26.81);Other abnormalities of gait and mobility (R26.89);Muscle weakness (generalized) (M62.81);Other symptoms and signs involving cognitive function   Activity Tolerance Patient limited by fatigue   Patient Left     Nurse Communication          Time: 4098-1191 OT Time Calculation (min): 44 min  Charges: OT General Charges $OT Visit: 1 Visit OT Treatments $Self Care/Home Management : 38-52 mins Ariel Arnold OT/L  Ariel Arnold 08/16/2023, 10:55 AM

## 2023-08-16 NOTE — Sedation Documentation (Signed)
Post right atrium- 10 mm Hg Post portal - 16 mm Hg

## 2023-08-16 NOTE — Progress Notes (Signed)
PT Cancellation Note  Patient Details Name: Ariel Arnold MRN: [id number] DOB: 07-11-1956   Cancelled Treatment:    Reason Eval/Treat Not Completed: (P) Patient at procedure or test/unavailable PT will follow back for treatment tomorrow.  Mychaela Lennartz B. Beverely Risen PT, DPT Acute Rehabilitation Services Please use secure chat or  Call Office [phone]    [facility] 08/16/2023, 2:19 PM

## 2023-08-16 NOTE — Sedation Documentation (Signed)
Angioplasty performed to TIPS

## 2023-08-16 NOTE — Sedation Documentation (Addendum)
Pt to remain in bed for thoracentesis, prepped sterile right lateral.

## 2023-08-16 NOTE — Progress Notes (Signed)
[facility] ROUNDING NOTE   Subjective: No acute events overnight.  Saw patient earlier this morning and was awaiting IR interrogation of TIPS.  Felt her breathing was about stable from yesterday.  Still on 4L O2 via [place]   Objective: Vital signs in last 24 hours: Temp:  [97.9 F (36.6 C)-98.3 F (36.8 C)] 98.2 F (36.8 C) (08/28 0855) Pulse Rate:  [85-90] 88 (08/28 1515) Resp:  [12-22] 20 (08/28 1515) BP: (103-129)/(48-88) 117/50 (08/28 1515) SpO2:  [93 %-99 %] 95 % (08/28 1515) Weight:  [79.6 kg] 79.6 kg (08/28 0449) Last BM Date : 08/15/23 General: NAD Abdomen:  Soft, NT, ND, +BS    Intake/Output from previous day: 08/27 0701 - 08/28 0700 In: 169.9 [IV Piggyback:169.9] Out: 2000 [Urine:2000] Intake/Output this shift: No intake/output data recorded.   Lab Results: Recent Labs    08/14/23 0416 08/15/23 0319 08/16/23 1230  WBC 3.2* 3.3* 3.1*  HGB 7.4* 8.2* 8.2*  PLT 60* 63* 65*  MCV 97.0 99.2 99.2   BMET Recent Labs    08/14/23 0416 08/15/23 0319 08/16/23 1230  NA 139 139 140  K 3.4* 4.0 3.6  CL 106 106 105  CO2 28 26 30   GLUCOSE 138* 165* 96  BUN 35* 28* 21  CREATININE 1.17* 1.04* 0.94  CALCIUM 8.1* 8.0* 8.1*   LFT Recent Labs    08/14/23 0416 08/16/23 1230  PROT 4.9* 5.3*  ALBUMIN 2.4* 2.6*  AST 28 30  ALT 15 17  ALKPHOS 56 70  BILITOT 1.9* 1.0   PT/INR Recent Labs    08/16/23 1230  INR 1.8*      Imaging/Other results: IR TIPS REVISION MOD SED  Result Date: 08/16/2023 CLINICAL DATA:  Hepatic hydrothorax, status post TIPS creation 08/09/2023. There is subsequent reaccumulation of right pleural effusion. EXAM: TIPS REVISION; IR THORACENTESIS ASP PLEURAL SPACE W/IMG GUIDE; ULTRASOUND ABDOMEN LIMITED; IR ULTRASOUND GUIDANCE VASC ACCESS RIGHT ANESTHESIA/SEDATION: Intravenous Fentanyl and Versed 0.5mg  were administered as conscious sedation during continuous monitoring of the patient's level of consciousness and  physiological / cardiorespiratory status by the radiology RN, with a total moderate sedation time of 75 minutes. MEDICATIONS: Lidocaine 1% subcutaneous CONTRAST:  30mL OMNIPAQUE IOHEXOL 300 MG/ML  SOLN PROCEDURE: The procedure, risks (including but not limited to bleeding, infection, organ damage ), benefits, and alternatives were explained to the patient. Questions regarding the procedure were encouraged and answered. The patient understands and consents to the procedure. Survey ultrasound of the right hemithorax was performed, moderate recurrent pleural effusion identified, and an appropriate skin entry site was localized. Site was marked, prepped with chlorhexidine, draped in usual sterile fashion, infiltrated locally with 1% lidocaine. The Saf-T-Centesis needle was advanced into the pleural space. Clear yellow pleural fluid returned. 2.2 L was removed. Post procedure imaging shows no residual fluid. The patient tolerated procedure well. Subsequently, right IJ region prepped and draped in usual sterile fashion. Maximal barrier sterile technique was utilized including caps, mask, sterile gowns, sterile gloves, sterile drape, hand hygiene and skin antiseptic. Under real-time ultrasound guidance, right IJ vein accessed with micropuncture set, exchanged over a Bentson wire for a 7 French 35 cm vascular sheath. Through this, a 5 [place] Kumpe catheter advanced and used to catheterize the right hepatic vein, and TIPS into the native portal venous system. TIPS venogram was obtained. Pressure measurements in the portal vein and at the right atrium level were obtained. Catheter was exchanged over a Rosen wire for an 8 mm x 4 cm Conquest angioplasty  balloon, and balloon angioplasty of the TIPS stent and tract was performed. Pressure measurements in the portal vein and at the right atrium level were obtained. Subsequently, catheter exchanged for a 10 mm by 4 cm Conquest angioplasty balloon, and further balloon angioplasty of  the TIPS stent and tract was performed. Pressure measurements in the portal vein and at the right atrium level were obtained. A final TIPS venogram was obtained. Catheter and sheath were removed and hemostasis achieved at the right IJ site with manual compression. Survey ultrasound of the abdomen showed a small amount of scattered abdominal ascites. Paracentesis was therefore deferred. The patient tolerated the procedure well. COMPLICATIONS: None immediate FINDINGS: Moderate right pleural effusion identified. 2.2 L thoracentesis performed. Initial TIPS venogram shows wide patency of the shunt. There is antegrade flow in the main portal vein, and hepatopetal flow in the right and left portal vein branches. No hepatic vein stenosis. Pressure measurements portal vein mean 17 mm Hg, right atrium mean 7 mmHg (gradient 10 mmHg). The TIPS stent and tract were dilated along entire length with the 8 mm balloon with overlapping inflations. Post pressures portal mean 15 mm Hg, right atrium 7 mmHg (gradient 8 mmHg). After further dilatation to 10 mm, post pressures portal mean 16 mm Hg, right atrium 10 mmHg (gradient 6 mmHg). Post TIPS venogram shows excellent flow through the stent. Continued hepatopetal flow in right and left portal branches. No hepatic vein stenosis. IMPRESSION: 1. TIPS remains patent, with 10 mmHg portosystemic gradient, increased from 4 mm gradient reported at the time of deployment. 2. Balloon angioplasty of TIPS stent and tract using 8 and 10 mm balloons with final portosystemic gradient 6 mmHg. 3. 2.2 L pleural fluid removed during right thoracentesis. 4. Small volume residual/recurrent abdominal ascites; paracentesis deferred. Electronically Signed   By: Corlis Leak M.D.   On: 08/16/2023 15:46   IR THORACENTESIS ASP PLEURAL SPACE W/IMG GUIDE  Result Date: 08/16/2023 CLINICAL DATA:  Hepatic hydrothorax, status post TIPS creation 08/09/2023. There is subsequent reaccumulation of right pleural  effusion. EXAM: TIPS REVISION; IR THORACENTESIS ASP PLEURAL SPACE W/IMG GUIDE; ULTRASOUND ABDOMEN LIMITED; IR ULTRASOUND GUIDANCE VASC ACCESS RIGHT ANESTHESIA/SEDATION: Intravenous Fentanyl and Versed 0.5mg  were administered as conscious sedation during continuous monitoring of the patient's level of consciousness and physiological / cardiorespiratory status by the radiology RN, with a total moderate sedation time of 75 minutes. MEDICATIONS: Lidocaine 1% subcutaneous CONTRAST:  30mL OMNIPAQUE IOHEXOL 300 MG/ML  SOLN PROCEDURE: The procedure, risks (including but not limited to bleeding, infection, organ damage ), benefits, and alternatives were explained to the patient. Questions regarding the procedure were encouraged and answered. The patient understands and consents to the procedure. Survey ultrasound of the right hemithorax was performed, moderate recurrent pleural effusion identified, and an appropriate skin entry site was localized. Site was marked, prepped with chlorhexidine, draped in usual sterile fashion, infiltrated locally with 1% lidocaine. The Saf-T-Centesis needle was advanced into the pleural space. Clear yellow pleural fluid returned. 2.2 L was removed. Post procedure imaging shows no residual fluid. The patient tolerated procedure well. Subsequently, right IJ region prepped and draped in usual sterile fashion. Maximal barrier sterile technique was utilized including caps, mask, sterile gowns, sterile gloves, sterile drape, hand hygiene and skin antiseptic. Under real-time ultrasound guidance, right IJ vein accessed with micropuncture set, exchanged over a Bentson wire for a 7 French 35 cm vascular sheath. Through this, a 5 [place] Kumpe catheter advanced and used to catheterize the right hepatic vein, and TIPS  into the native portal venous system. TIPS venogram was obtained. Pressure measurements in the portal vein and at the right atrium level were obtained. Catheter was exchanged over a  Rosen wire for an 8 mm x 4 cm Conquest angioplasty balloon, and balloon angioplasty of the TIPS stent and tract was performed. Pressure measurements in the portal vein and at the right atrium level were obtained. Subsequently, catheter exchanged for a 10 mm by 4 cm Conquest angioplasty balloon, and further balloon angioplasty of the TIPS stent and tract was performed. Pressure measurements in the portal vein and at the right atrium level were obtained. A final TIPS venogram was obtained. Catheter and sheath were removed and hemostasis achieved at the right IJ site with manual compression. Survey ultrasound of the abdomen showed a small amount of scattered abdominal ascites. Paracentesis was therefore deferred. The patient tolerated the procedure well. COMPLICATIONS: None immediate FINDINGS: Moderate right pleural effusion identified. 2.2 L thoracentesis performed. Initial TIPS venogram shows wide patency of the shunt. There is antegrade flow in the main portal vein, and hepatopetal flow in the right and left portal vein branches. No hepatic vein stenosis. Pressure measurements portal vein mean 17 mm Hg, right atrium mean 7 mmHg (gradient 10 mmHg). The TIPS stent and tract were dilated along entire length with the 8 mm balloon with overlapping inflations. Post pressures portal mean 15 mm Hg, right atrium 7 mmHg (gradient 8 mmHg). After further dilatation to 10 mm, post pressures portal mean 16 mm Hg, right atrium 10 mmHg (gradient 6 mmHg). Post TIPS venogram shows excellent flow through the stent. Continued hepatopetal flow in right and left portal branches. No hepatic vein stenosis. IMPRESSION: 1. TIPS remains patent, with 10 mmHg portosystemic gradient, increased from 4 mm gradient reported at the time of deployment. 2. Balloon angioplasty of TIPS stent and tract using 8 and 10 mm balloons with final portosystemic gradient 6 mmHg. 3. 2.2 L pleural fluid removed during right thoracentesis. 4. Small volume  residual/recurrent abdominal ascites; paracentesis deferred. Electronically Signed   By: Corlis Leak M.D.   On: 08/16/2023 15:46   IR US Guide Vasc Access Right  Result Date: 08/16/2023 CLINICAL DATA:  Hepatic hydrothorax, status post TIPS creation 08/09/2023. There is subsequent reaccumulation of right pleural effusion. EXAM: TIPS REVISION; IR THORACENTESIS ASP PLEURAL SPACE W/IMG GUIDE; ULTRASOUND ABDOMEN LIMITED; IR ULTRASOUND GUIDANCE VASC ACCESS RIGHT ANESTHESIA/SEDATION: Intravenous Fentanyl and Versed 0.5mg  were administered as conscious sedation during continuous monitoring of the patient's level of consciousness and physiological / cardiorespiratory status by the radiology RN, with a total moderate sedation time of 75 minutes. MEDICATIONS: Lidocaine 1% subcutaneous CONTRAST:  30mL OMNIPAQUE IOHEXOL 300 MG/ML  SOLN PROCEDURE: The procedure, risks (including but not limited to bleeding, infection, organ damage ), benefits, and alternatives were explained to the patient. Questions regarding the procedure were encouraged and answered. The patient understands and consents to the procedure. Survey ultrasound of the right hemithorax was performed, moderate recurrent pleural effusion identified, and an appropriate skin entry site was localized. Site was marked, prepped with chlorhexidine, draped in usual sterile fashion, infiltrated locally with 1% lidocaine. The Saf-T-Centesis needle was advanced into the pleural space. Clear yellow pleural fluid returned. 2.2 L was removed. Post procedure imaging shows no residual fluid. The patient tolerated procedure well. Subsequently, right IJ region prepped and draped in usual sterile fashion. Maximal barrier sterile technique was utilized including caps, mask, sterile gowns, sterile gloves, sterile drape, hand hygiene and skin antiseptic. Under real-time ultrasound  guidance, right IJ vein accessed with micropuncture set, exchanged over a Bentson wire for a 7 French  35 cm vascular sheath. Through this, a 5 [place] Kumpe catheter advanced and used to catheterize the right hepatic vein, and TIPS into the native portal venous system. TIPS venogram was obtained. Pressure measurements in the portal vein and at the right atrium level were obtained. Catheter was exchanged over a Rosen wire for an 8 mm x 4 cm Conquest angioplasty balloon, and balloon angioplasty of the TIPS stent and tract was performed. Pressure measurements in the portal vein and at the right atrium level were obtained. Subsequently, catheter exchanged for a 10 mm by 4 cm Conquest angioplasty balloon, and further balloon angioplasty of the TIPS stent and tract was performed. Pressure measurements in the portal vein and at the right atrium level were obtained. A final TIPS venogram was obtained. Catheter and sheath were removed and hemostasis achieved at the right IJ site with manual compression. Survey ultrasound of the abdomen showed a small amount of scattered abdominal ascites. Paracentesis was therefore deferred. The patient tolerated the procedure well. COMPLICATIONS: None immediate FINDINGS: Moderate right pleural effusion identified. 2.2 L thoracentesis performed. Initial TIPS venogram shows wide patency of the shunt. There is antegrade flow in the main portal vein, and hepatopetal flow in the right and left portal vein branches. No hepatic vein stenosis. Pressure measurements portal vein mean 17 mm Hg, right atrium mean 7 mmHg (gradient 10 mmHg). The TIPS stent and tract were dilated along entire length with the 8 mm balloon with overlapping inflations. Post pressures portal mean 15 mm Hg, right atrium 7 mmHg (gradient 8 mmHg). After further dilatation to 10 mm, post pressures portal mean 16 mm Hg, right atrium 10 mmHg (gradient 6 mmHg). Post TIPS venogram shows excellent flow through the stent. Continued hepatopetal flow in right and left portal branches. No hepatic vein stenosis. IMPRESSION: 1. TIPS remains  patent, with 10 mmHg portosystemic gradient, increased from 4 mm gradient reported at the time of deployment. 2. Balloon angioplasty of TIPS stent and tract using 8 and 10 mm balloons with final portosystemic gradient 6 mmHg. 3. 2.2 L pleural fluid removed during right thoracentesis. 4. Small volume residual/recurrent abdominal ascites; paracentesis deferred. Electronically Signed   By: Corlis Leak M.D.   On: 08/16/2023 15:46   IR ABDOMEN US LIMITED  Result Date: 08/16/2023 CLINICAL DATA:  Hepatic hydrothorax, status post TIPS creation 08/09/2023. There is subsequent reaccumulation of right pleural effusion. EXAM: TIPS REVISION; IR THORACENTESIS ASP PLEURAL SPACE W/IMG GUIDE; ULTRASOUND ABDOMEN LIMITED; IR ULTRASOUND GUIDANCE VASC ACCESS RIGHT ANESTHESIA/SEDATION: Intravenous Fentanyl and Versed 0.5mg  were administered as conscious sedation during continuous monitoring of the patient's level of consciousness and physiological / cardiorespiratory status by the radiology RN, with a total moderate sedation time of 75 minutes. MEDICATIONS: Lidocaine 1% subcutaneous CONTRAST:  30mL OMNIPAQUE IOHEXOL 300 MG/ML  SOLN PROCEDURE: The procedure, risks (including but not limited to bleeding, infection, organ damage ), benefits, and alternatives were explained to the patient. Questions regarding the procedure were encouraged and answered. The patient understands and consents to the procedure. Survey ultrasound of the right hemithorax was performed, moderate recurrent pleural effusion identified, and an appropriate skin entry site was localized. Site was marked, prepped with chlorhexidine, draped in usual sterile fashion, infiltrated locally with 1% lidocaine. The Saf-T-Centesis needle was advanced into the pleural space. Clear yellow pleural fluid returned. 2.2 L was removed. Post procedure imaging shows no residual fluid. The patient  tolerated procedure well. Subsequently, right IJ region prepped and draped in usual  sterile fashion. Maximal barrier sterile technique was utilized including caps, mask, sterile gowns, sterile gloves, sterile drape, hand hygiene and skin antiseptic. Under real-time ultrasound guidance, right IJ vein accessed with micropuncture set, exchanged over a Bentson wire for a 7 French 35 cm vascular sheath. Through this, a 5 [place] Kumpe catheter advanced and used to catheterize the right hepatic vein, and TIPS into the native portal venous system. TIPS venogram was obtained. Pressure measurements in the portal vein and at the right atrium level were obtained. Catheter was exchanged over a Rosen wire for an 8 mm x 4 cm Conquest angioplasty balloon, and balloon angioplasty of the TIPS stent and tract was performed. Pressure measurements in the portal vein and at the right atrium level were obtained. Subsequently, catheter exchanged for a 10 mm by 4 cm Conquest angioplasty balloon, and further balloon angioplasty of the TIPS stent and tract was performed. Pressure measurements in the portal vein and at the right atrium level were obtained. A final TIPS venogram was obtained. Catheter and sheath were removed and hemostasis achieved at the right IJ site with manual compression. Survey ultrasound of the abdomen showed a small amount of scattered abdominal ascites. Paracentesis was therefore deferred. The patient tolerated the procedure well. COMPLICATIONS: None immediate FINDINGS: Moderate right pleural effusion identified. 2.2 L thoracentesis performed. Initial TIPS venogram shows wide patency of the shunt. There is antegrade flow in the main portal vein, and hepatopetal flow in the right and left portal vein branches. No hepatic vein stenosis. Pressure measurements portal vein mean 17 mm Hg, right atrium mean 7 mmHg (gradient 10 mmHg). The TIPS stent and tract were dilated along entire length with the 8 mm balloon with overlapping inflations. Post pressures portal mean 15 mm Hg, right atrium 7 mmHg (gradient 8  mmHg). After further dilatation to 10 mm, post pressures portal mean 16 mm Hg, right atrium 10 mmHg (gradient 6 mmHg). Post TIPS venogram shows excellent flow through the stent. Continued hepatopetal flow in right and left portal branches. No hepatic vein stenosis. IMPRESSION: 1. TIPS remains patent, with 10 mmHg portosystemic gradient, increased from 4 mm gradient reported at the time of deployment. 2. Balloon angioplasty of TIPS stent and tract using 8 and 10 mm balloons with final portosystemic gradient 6 mmHg. 3. 2.2 L pleural fluid removed during right thoracentesis. 4. Small volume residual/recurrent abdominal ascites; paracentesis deferred. Electronically Signed   By: Corlis Leak M.D.   On: 08/16/2023 15:46   DG Chest Port 1 View  Result Date: 08/16/2023 CLINICAL DATA:  Right pleural effusion post thoracentesis EXAM: PORTABLE CHEST - 1 VIEW COMPARISON:  the previous day's study FINDINGS: No pneumothorax. Resolution of right pleural effusion. Improved aeration at the right lung base. Coarse interstitial and alveolar opacities throughout both lungs. Patchy airspace opacities in the left lung base slightly improved. Heart size and mediastinal contours are within normal limits. No effusion. Visualized bones unremarkable. Left upper quadrant embolization coils. IMPRESSION: 1. Resolution of right pleural effusion. No pneumothorax. 2. Persistent coarse interstitial and alveolar opacities throughout both lungs. Electronically Signed   By: Corlis Leak M.D.   On: 08/16/2023 14:37   DG CHEST PORT 1 VIEW  Result Date: 08/15/2023 CLINICAL DATA:  Short of breath, prior abnormal x-ray EXAM: PORTABLE CHEST 1 VIEW COMPARISON:  08/14/2023 FINDINGS: Single frontal view of the chest demonstrates stable enlargement of the cardiac silhouette. Decreased right pleural effusion since prior  study. There is stable multifocal bilateral airspace disease, greatest at the lung bases. No pneumothorax. No acute bony abnormalities.  Embolic coils over the central upper abdomen unchanged. IMPRESSION: 1. Multifocal bilateral airspace disease consistent with edema or infection. 2. Decreased right pleural effusion. Electronically Signed   By: Sharlet Salina M.D.   On: 08/15/2023 18:45      Assessment and Plan:  67 year old female with decompensated MASH cirrhosis c/b gastric and esophageal varices, portal hypertension, hepatic encephalopathy, ascites, hepatic hydrothorax, portal colopathy, portal hypertension, along with nonvariceal bleed in 01/2021 (colonoscopy with right sided AVMs treated with APC and clip).  Admitted to [facility] earlier this month with right heel ulcer and diagnosed with hepatic hydrothorax requiring thoracentesis on 8/14, 8/16, then ultimately transferred for TIPS (with repeat thoracentesis and paracentesis) on 8/21.  Initially with improved pulmonary function, but over the last couple of days has had increasing supplemental oxygen requirement.  Serial CXR with small right pleural effusion and pulmonary edema.  - Plan for TIPS evaluation by IR today - Continue aggressive diuretics with IV Lasix along with spironolactone - Intermittent IV albumin with Lasix chase to promote diuresis of pulmonary edema - Continue midodrine as needed for BP support with advancing diuretics - Depending on TIPS evaluation/intervention, if downsizing of stent, may necessitate decreasing her diuretics again.  Will reevaluate after - Repeat CXR in the morning - Continue lactulose/rifaximin -GI service will continue to follow    Shellia Cleverly, DO  08/16/2023, 5:03 PM [facility] Pager [phone]

## 2023-08-16 NOTE — Plan of Care (Signed)
  Problem: Education: Goal: Knowledge of General Education information will improve Description: Including pain rating scale, medication(s)/side effects and non-pharmacologic comfort measures Outcome: Progressing   Problem: Health Behavior/Discharge Planning: Goal: Ability to manage health-related needs will improve Outcome: Progressing   Problem: Clinical Measurements: Goal: Ability to maintain clinical measurements within normal limits will improve Outcome: Progressing Goal: Will remain free from infection Outcome: Progressing Goal: Diagnostic test results will improve Outcome: Progressing Goal: Respiratory complications will improve Outcome: Progressing Goal: Cardiovascular complication will be avoided Outcome: Progressing   Problem: Activity: Goal: Risk for activity intolerance will decrease Outcome: Progressing   Problem: Nutrition: Goal: Adequate nutrition will be maintained Outcome: Progressing   Problem: Coping: Goal: Level of anxiety will decrease Outcome: Progressing   Problem: Elimination: Goal: Will not experience complications related to bowel motility Outcome: Progressing Goal: Will not experience complications related to urinary retention Outcome: Progressing   Problem: Pain Managment: Goal: General experience of comfort will improve Outcome: Progressing   Problem: Safety: Goal: Ability to remain free from injury will improve Outcome: Progressing   Problem: Skin Integrity: Goal: Risk for impaired skin integrity will decrease Outcome: Progressing   Problem: Education: Goal: Ability to describe self-care measures that may prevent or decrease complications (Diabetes Survival Skills Education) will improve Outcome: Progressing Goal: Individualized Educational Video(s) Outcome: Progressing   Problem: Coping: Goal: Ability to adjust to condition or change in health will improve Outcome: Progressing   Problem: Fluid Volume: Goal: Ability to  maintain a balanced intake and output will improve Outcome: Progressing   Problem: Health Behavior/Discharge Planning: Goal: Ability to identify and utilize available resources and services will improve Outcome: Progressing Goal: Ability to manage health-related needs will improve Outcome: Progressing   Problem: Metabolic: Goal: Ability to maintain appropriate glucose levels will improve Outcome: Progressing   Problem: Nutritional: Goal: Maintenance of adequate nutrition will improve Outcome: Progressing Goal: Progress toward achieving an optimal weight will improve Outcome: Progressing   Problem: Skin Integrity: Goal: Risk for impaired skin integrity will decrease Outcome: Progressing   Problem: Tissue Perfusion: Goal: Adequacy of tissue perfusion will improve Outcome: Progressing   Problem: Activity: Goal: Ability to return to baseline activity level will improve Outcome: Progressing   Problem: Health Behavior/Discharge Planning: Goal: Ability to safely manage health-related needs after discharge will improve Outcome: Progressing

## 2023-08-16 NOTE — Plan of Care (Signed)
  Problem: Education: Goal: Knowledge of General Education information will improve Description: Including pain rating scale, medication(s)/side effects and non-pharmacologic comfort measures Outcome: Progressing   Problem: Health Behavior/Discharge Planning: Goal: Ability to manage health-related needs will improve Outcome: Progressing   Problem: Clinical Measurements: Goal: Ability to maintain clinical measurements within normal limits will improve Outcome: Progressing Goal: Will remain free from infection Outcome: Progressing Goal: Diagnostic test results will improve Outcome: Progressing Goal: Respiratory complications will improve Outcome: Progressing Goal: Cardiovascular complication will be avoided Outcome: Progressing   Problem: Activity: Goal: Risk for activity intolerance will decrease Outcome: Progressing   Problem: Nutrition: Goal: Adequate nutrition will be maintained Outcome: Progressing   Problem: Coping: Goal: Level of anxiety will decrease Outcome: Progressing   Problem: Elimination: Goal: Will not experience complications related to bowel motility Outcome: Progressing Goal: Will not experience complications related to urinary retention Outcome: Progressing   Problem: Pain Managment: Goal: General experience of comfort will improve Outcome: Progressing   Problem: Safety: Goal: Ability to remain free from injury will improve Outcome: Progressing   Problem: Skin Integrity: Goal: Risk for impaired skin integrity will decrease Outcome: Progressing   Problem: Education: Goal: Ability to describe self-care measures that may prevent or decrease complications (Diabetes Survival Skills Education) will improve Outcome: Progressing Goal: Individualized Educational Video(s) Outcome: Progressing   Problem: Coping: Goal: Ability to adjust to condition or change in health will improve Outcome: Progressing   Problem: Fluid Volume: Goal: Ability to  maintain a balanced intake and output will improve Outcome: Progressing   Problem: Health Behavior/Discharge Planning: Goal: Ability to identify and utilize available resources and services will improve Outcome: Progressing Goal: Ability to manage health-related needs will improve Outcome: Progressing   Problem: Metabolic: Goal: Ability to maintain appropriate glucose levels will improve Outcome: Progressing   Problem: Nutritional: Goal: Maintenance of adequate nutrition will improve Outcome: Progressing Goal: Progress toward achieving an optimal weight will improve Outcome: Progressing   Problem: Skin Integrity: Goal: Risk for impaired skin integrity will decrease Outcome: Progressing   Problem: Tissue Perfusion: Goal: Adequacy of tissue perfusion will improve Outcome: Progressing   Problem: Education: Goal: Understanding of CV disease, CV risk reduction, and recovery process will improve Outcome: Progressing Goal: Individualized Educational Video(s) Outcome: Progressing   Problem: Activity: Goal: Ability to return to baseline activity level will improve Outcome: Progressing   Problem: Cardiovascular: Goal: Ability to achieve and maintain adequate cardiovascular perfusion will improve Outcome: Progressing Goal: Vascular access site(s) Level 0-1 will be maintained Outcome: Progressing   Problem: Health Behavior/Discharge Planning: Goal: Ability to safely manage health-related needs after discharge will improve Outcome: Progressing   Aela Bohan Tamera Stands, RN

## 2023-08-16 NOTE — Sedation Documentation (Addendum)
Pt moved to procedure table, prepped supine for paracentesis and tips revision

## 2023-08-16 NOTE — Progress Notes (Signed)
PT Cancellation Note  Patient Details Name: FELISA VEGA MRN: [id number] DOB: [birth year]   Cancelled Treatment:    Reason Eval/Treat Not Completed: (P) Other (comment) Chaplain in room with pt. PT will follow back later for treatment as able.  Rooney Swails B. Beverely Risen PT, DPT Acute Rehabilitation Services Please use secure chat or  Call Office [phone]     Elon Alas [facility] 08/16/2023, 10:51 AM

## 2023-08-16 NOTE — Progress Notes (Signed)
PROGRESS NOTE  Ariel Arnold:[id number] DOB: 16-Sep-1956 DOA: 08/07/2023 PCP: Joaquin Courts, DO   LOS: 9 days   Brief Narrative / Interim history: Ariel Arnold is a pleasant 67 y.o. female with medical history significant for NASH cirrhosis with ascites, insulin-dependent diabetes mellitus, depression, anxiety, and right heel ulcer who presented to [facility] on 08/01/2023 with shortness of breath. Patient had first paracentesis approximately a week prior to this presentation.   [facility] Course: Upon arrival to the ED, patient was found to have large right pleural effusion and a new supplemental oxygen requirement.  She was admitted to the hospitalist service and underwent therapeutic thoracenteses on 08/02/2023 and again on 08/04/2023.  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  Dr. Archer Asa of IR agreed to see the patient in consultation for consideration of TIPS.  She was transferred to [facility] under hospitalist for IR evaluation.   She underwent TIPS on procedure on 8/21 with postprocedure hypotension and respiratory distress requiring transfer to ICU for vasopressors.  She was transferred back to [facility] on 08/13/2023. She was on RA on 8/25, but back on 4 lit of [place] oxygen. CXR showing pulmonary edema with right sided pleural effusion. She was restarted on lasix 20 mg BID without much diuresis. On exam today she is more dyspneic on talking, does nt fee too good to go home.   Subjective / 24h Interval events: Complains of shortness of breath, overall not feeling good, also with significant diarrhea overnight  Assesement and Plan: Principal Problem:   Decompensated hepatic cirrhosis (HCC) Active Problems:   Acute respiratory failure with hypoxia (HCC)   Recurrent pleural effusion on right   Type 2 diabetes mellitus with diabetic neuropathy, unspecified (HCC)   Thrombocytopenia (HCC)   Essential hypertension   CKD stage G3a/A3, GFR 45-59 and albumin  creatinine ratio >300 mg/g (HCC)   Major depressive disorder, single episode, unspecified   Hypomagnesemia   History of peptic ulcer disease   Mixed hyperlipidemia   Pressure injury of skin   Acute pulmonary edema (HCC)   Pleural effusion   Principal problem Acute hypoxic respiratory failure secondary to recurrent pleural effusion due to hepatic hydrothorax and pulmonary edema - S/p thoracentesis x 3, on 8/14, 8/16 and 8/21, also underwent TIPS on 8/21 -Remains dyspneic, continue supplemental oxygen -Chest x-ray with bilateral airspace disease, possibly edema.  Continue diuretics with spironolactone, received IV furosemide with albumin yesterday -IR consulted, TIPS to be looked at again today   Active problems NASH cirrhosis - S/p TIPS on 08/09/2023 with postprocedure hypotension and respiratory distress. Continue with lactulose and rifaximin   Stage IIIa CKD - Creatinine stable at 1.1    Hypokalemia - Replaced. Magnesium level within normal limits.  Continue to monitor  Anemia of chronic disease - From cirrhosis. Hemoglobin around 8.2. transfuse to keep hemoglobin greater than 7.    Chronic thrombocytopenia - From cirrhosis, platelets around 63,000. Continue to monitor   Depression/anxiety - Continue with sertraline.   Hyperlipidemia - Continue with Pravachol   In view of poor progression, deconditioning palliative care consulted, patient declined hospice, wants to go home with [facility].    Lab Results  Component Value Date   HGBA1C 5.0 08/08/2023   CBG (last 3)  Recent Labs    08/16/23 0003 08/16/23 0450 08/16/23 0853  GLUCAP 142* 119* 180*    Scheduled Meds:  Chlorhexidine Gluconate Cloth  6 each Topical Daily   diclofenac Sodium  4 g Topical QID  enoxaparin (LOVENOX) injection  40 mg Subcutaneous Q24H   folic acid  1 mg Oral BID   Gerhardt's butt cream   Topical BID   insulin aspart  0-15 Units Subcutaneous TID WC   insulin aspart  0-5 Units Subcutaneous QHS    insulin glargine-yfgn  20 Units Subcutaneous QHS   lactulose  20 g Oral TID   midodrine  5 mg Oral TID WC   mouth rinse  15 mL Mouth Rinse 4 times per day   pantoprazole  40 mg Oral Daily   pravastatin  20 mg Oral QHS   rifaximin  550 mg Oral BID   sertraline  150 mg Oral Daily   spironolactone  25 mg Oral Daily   Continuous Infusions:  sodium chloride     PRN Meds:.albuterol, gabapentin, hydrOXYzine, iohexol, ondansetron **OR** ondansetron (ZOFRAN) IV, mouth rinse, oxyCODONE  Current Outpatient Medications  Medication Instructions   albuterol (VENTOLIN HFA) 108 (90 Base) MCG/ACT inhaler 2 puffs, Inhalation, 2 times daily PRN   ascorbic acid (VITAMIN C) 500 mg, Oral, 2 times daily   Calcium Carbonate-Vit D-Min (CALCIUM 1200) 1200-1000 MG-UNIT CHEW 2 tablets, Oral, Daily   carvedilol (COREG) 6.25 mg, Oral, 2 times daily   clotrimazole-betamethasone (LOTRISONE) cream 1 Application, Topical, Daily PRN   diclofenac Sodium (VOLTAREN) 2 g, Topical, 2 times daily   ferrous sulfate 325 mg, Oral, 2 times daily   folic acid (FOLVITE) 1 mg, Oral, 2 times daily   furosemide (LASIX) 20 mg, Oral, Weekly   gabapentin (NEURONTIN) 600 mg, Oral, 3 times daily PRN   hydrOXYzine (ATARAX) 25 mg, Oral, Daily   insulin aspart (NOVOLOG) 100 UNIT/ML injection 0-20 Units, Subcutaneous, 3 times daily with meal CBG < 70: Implement Hypoglycemia measures CBG 70 - 120: 0 units CBG 121 - 150: 3 units CBG 151 - 200: 4 units CBG 201 - 250: 7 units CBG 251 - 300: 11 units CBG 301 - 350: 15 units CBG 351 - 400: 20 units CBG > 400: call MD   isosorbide mononitrate (IMDUR) 30 MG 24 hr tablet Oral   ketorolac (ACULAR) 0.5 % ophthalmic solution 1 drop, Both Eyes, 4 times daily PRN   lactulose (CHRONULAC) 10 g, Oral, 2 times daily, for ammonia levels   oxyCODONE (OXY IR/ROXICODONE) 5 mg, Oral, Every 6 hours PRN   pantoprazole (PROTONIX) 40 mg, Oral, Daily PRN   pravastatin (PRAVACHOL) 20 mg, Oral, Daily at bedtime, Take  after completion of daptomycin   rifaximin (XIFAXAN) 550 mg, Oral, 2 times daily   sertraline (ZOLOFT) 200 mg, Oral, Daily   sodium hypochlorite (DAKIN'S 1/2 STRENGTH) external solution 1 Application, Irrigation, Daily   spironolactone (ALDACTONE) 50 mg, Oral, Daily PRN   Toujeo Max SoloStar 20 Units, Subcutaneous, Daily at bedtime   Vitamin D, Ergocalciferol, (DRISDOL) 1.25 MG (50000 UNIT) CAPS capsule 1 capsule, Oral, Every 7 days, Mondays    Diet Orders (From admission, onward)     Start     Ordered   08/16/23 0001  Diet NPO time specified Except for: Sips with Meds  Diet effective midnight       Question:  Except for  Answer:  Sips with Meds   08/15/23 1514            DVT prophylaxis: enoxaparin (LOVENOX) injection 40 mg Start: 08/10/23 1015 SCDs Start: 08/07/23 1524   Lab Results  Component Value Date   PLT 63 (L) 08/15/2023      Code Status: DNR  Family Communication:  no family at bedside   Status is: Inpatient  Remains inpatient appropriate because: severity of illness  Level of care: Med-Surg  Consultants:  IR GI PCCM Palliative  Objective: Vitals:   08/15/23 1959 08/16/23 0449 08/16/23 0449 08/16/23 0855  BP: (!) 110/48  (!) 129/56 (!) 123/51  Pulse: 85  86 88  Resp: 20  20 16   Temp: 98.3 F (36.8 C)  97.9 F (36.6 C) 98.2 F (36.8 C)  TempSrc: Oral  Oral Oral  SpO2: 99%  98% 98%  Weight:  79.6 kg    Height:        Intake/Output Summary (Last 24 hours) at 08/16/2023 1037 Last data filed at 08/16/2023 0449 Gross per 24 hour  Intake 169.85 ml  Output 2000 ml  Net -1830.15 ml   Wt Readings from Last 3 Encounters:  08/16/23 79.6 kg  08/21/22 86.7 kg  06/02/22 85.6 kg    Examination:  Constitutional: NAD Eyes: no scleral icterus ENMT: Mucous membranes are moist.  Neck: normal, supple Respiratory: clear to auscultation bilaterally, diminished at the bases Cardiovascular: Regular rate and rhythm, no murmurs / rubs / gallops. No LE  edema.  Abdomen: non distended, no tenderness. Bowel sounds positive.  Musculoskeletal: no clubbing / cyanosis.    Data Reviewed: I have independently reviewed following labs and imaging studies   CBC Recent Labs  Lab 08/10/23 0359 08/11/23 0511 08/13/23 0411 08/14/23 0416 08/15/23 0319  WBC 9.3 5.1 5.5 3.2* 3.3*  HGB 9.6* 7.9* 8.9* 7.4* 8.2*  HCT 28.4* 23.8* 27.6* 22.7* 25.4*  PLT 111* 65* 87* 60* 63*  MCV 92.2 95.2 96.2 97.0 99.2  MCH 31.2 31.6 31.0 31.6 32.0  MCHC 33.8 33.2 32.2 32.6 32.3  RDW 14.6 15.3 15.0 15.0 15.3  LYMPHSABS  --   --   --  1.0 1.0  MONOABS  --   --   --  0.3 0.3  EOSABS  --   --   --  0.3 0.3  BASOSABS  --   --   --  0.0 0.0    Recent Labs  Lab 08/10/23 0359 08/10/23 0558 08/11/23 0511 08/12/23 0913 08/13/23 0411 08/14/23 0416 08/15/23 0319  NA 134*  --  136 138 139 139 139  K 3.7  --  4.2 3.2* 4.1 3.4* 4.0  CL 99  --  104 104 107 106 106  CO2 25  --  23 26 24 28 26   GLUCOSE 108*  --  103* 158* 162* 138* 165*  BUN 25*  --  30* 33* 33* 35* 28*  CREATININE 0.90  --  1.10* 1.16* 1.25* 1.17* 1.04*  CALCIUM 7.7*  --  7.9* 8.0* 8.2* 8.1* 8.0*  AST 23  --   --   --   --  28  --   ALT 15  --   --   --   --  15  --   ALKPHOS 60  --   --   --   --  56  --   BILITOT 2.7*  --   --   --   --  1.9*  --   ALBUMIN 1.9*  --   --   --   --  2.4*  --   MG 1.6*  --  2.2 1.9  --   --   --   AMMONIA  --  53* 57* 31  --   --   --   BNP  --   --   --   --   --   --  353.4*    ------------------------------------------------------------------------------------------------------------------ No results for input(s): "CHOL", "HDL", "LDLCALC", "TRIG", "CHOLHDL", "LDLDIRECT" in the last 72 hours.  Lab Results  Component Value Date   HGBA1C 5.0 08/08/2023   ------------------------------------------------------------------------------------------------------------------ No results for input(s): "TSH", "T4TOTAL", "T3FREE", "THYROIDAB" in the last 72  hours.  Invalid input(s): "FREET3"  Cardiac Enzymes No results for input(s): "CKMB", "TROPONINI", "MYOGLOBIN" in the last 168 hours.  Invalid input(s): "CK" ------------------------------------------------------------------------------------------------------------------    Component Value Date/Time   BNP 353.4 (H) 08/15/2023 0319    CBG: Recent Labs  Lab 08/15/23 1604 08/15/23 2000 08/16/23 0003 08/16/23 0450 08/16/23 0853  GLUCAP 139* 207* 142* 119* 180*    Recent Results (from the past 240 hour(s))  MRSA Next Gen by PCR, Nasal     Status: Abnormal   Collection Time: 08/09/23 11:11 AM   Specimen: Nasal Mucosa; Nasal Swab  Result Value Ref Range Status   MRSA by PCR Next Gen DETECTED (A) NOT DETECTED Final    Comment: RESULT CALLED TO, READ BACK BY AND VERIFIED WITH: RN DILLIN.K AT 1409 ON 08/09/2023 BY T.SAAD. (NOTE) The GeneXpert MRSA Assay (FDA approved for NASAL specimens only), is one component of a comprehensive MRSA colonization surveillance program. It is not intended to diagnose MRSA infection nor to guide or monitor treatment for MRSA infections. Test performance is not FDA approved in patients less than 31 years old. Performed at [facility], [address] [place], [place] [address]      Radiology Studies: DG CHEST PORT 1 VIEW  Result Date: 08/15/2023 CLINICAL DATA:  Short of breath, prior abnormal x-ray EXAM: PORTABLE CHEST 1 VIEW COMPARISON:  08/14/2023 FINDINGS: Single frontal view of the chest demonstrates stable enlargement of the cardiac silhouette. Decreased right pleural effusion since prior study. There is stable multifocal bilateral airspace disease, greatest at the lung bases. No pneumothorax. No acute bony abnormalities. Embolic coils over the central upper abdomen unchanged. IMPRESSION: 1. Multifocal bilateral airspace disease consistent with edema or infection. 2. Decreased right pleural effusion. Electronically Signed   By: Sharlet Salina M.D.   On: 08/15/2023 18:45     Pamella Pert, MD, PhD [facility]  Between 7 am - 7 pm I am available, please contact me via Amion (for emergencies) or Securechat (non urgent messages)  Between 7 pm - 7 am I am not available, please contact night coverage MD/APP via Amion

## 2023-08-16 NOTE — Progress Notes (Addendum)
This chaplain responded to PMT NP-Amber consult for emotional support. The Pt. is working with OT at the time of the visit. This chaplain will plan to revisit.  **[id number] This chaplain returned to the bedside and listened reflectively as the Pt. settled into storytelling about the importance of a multi-generational family.  The chaplain understands the importance of family legacy is connected to the Pt. returning home to be present with grandchildren and great grandchildren. Family along with her faith is a source of  the Pt. hope and coping strength.   The chaplain understands the Pt. stomach pain (level 4-5) is better after an "evening of diarrhea". The Pt. shares she remains "tired" today.   The Pt. invited the chaplain to return for F/U spiritual care. The chaplain shared prayer with the Pt.  Chaplain Stephanie Acre [phone]

## 2023-08-16 NOTE — Plan of Care (Signed)
Problem: Education: Goal: Knowledge of General Education information will improve Description: Including pain rating scale, medication(s)/side effects and non-pharmacologic comfort measures 08/16/2023 1931 by Kelli Hope, RN Outcome: Progressing 08/16/2023 1931 by Kelli Hope, RN Outcome: Progressing   Problem: Health Behavior/Discharge Planning: Goal: Ability to manage health-related needs will improve 08/16/2023 1931 by Kelli Hope, RN Outcome: Progressing 08/16/2023 1931 by Kelli Hope, RN Outcome: Progressing   Problem: Clinical Measurements: Goal: Ability to maintain clinical measurements within normal limits will improve 08/16/2023 1931 by Kelli Hope, RN Outcome: Progressing 08/16/2023 1931 by Kelli Hope, RN Outcome: Progressing Goal: Will remain free from infection 08/16/2023 1931 by Kelli Hope, RN Outcome: Progressing 08/16/2023 1931 by Kelli Hope, RN Outcome: Progressing Goal: Diagnostic test results will improve 08/16/2023 1931 by Kelli Hope, RN Outcome: Progressing 08/16/2023 1931 by Kelli Hope, RN Outcome: Progressing Goal: Respiratory complications will improve 08/16/2023 1931 by Kelli Hope, RN Outcome: Progressing 08/16/2023 1931 by Kelli Hope, RN Outcome: Progressing Goal: Cardiovascular complication will be avoided 08/16/2023 1931 by Kelli Hope, RN Outcome: Progressing 08/16/2023 1931 by Kelli Hope, RN Outcome: Progressing   Problem: Activity: Goal: Risk for activity intolerance will decrease 08/16/2023 1931 by Kelli Hope, RN Outcome: Progressing 08/16/2023 1931 by Kelli Hope, RN Outcome: Progressing   Problem: Nutrition: Goal: Adequate nutrition will be maintained 08/16/2023 1931 by Kelli Hope, RN Outcome: Progressing 08/16/2023 1931 by Kelli Hope, RN Outcome: Progressing   Problem: Coping: Goal: Level of anxiety will decrease 08/16/2023 1931 by Kelli Hope, RN Outcome: Progressing 08/16/2023 1931 by Kelli Hope, RN Outcome: Progressing   Problem: Elimination: Goal: Will not experience complications related to bowel motility 08/16/2023 1931 by Kelli Hope, RN Outcome: Progressing 08/16/2023 1931 by Kelli Hope, RN Outcome: Progressing Goal: Will not experience complications related to urinary retention 08/16/2023 1931 by Kelli Hope, RN Outcome: Progressing 08/16/2023 1931 by Kelli Hope, RN Outcome: Progressing   Problem: Pain Managment: Goal: General experience of comfort will improve 08/16/2023 1931 by Kelli Hope, RN Outcome: Progressing 08/16/2023 1931 by Kelli Hope, RN Outcome: Progressing   Problem: Safety: Goal: Ability to remain free from injury will improve 08/16/2023 1931 by Kelli Hope, RN Outcome: Progressing 08/16/2023 1931 by Kelli Hope, RN Outcome: Progressing   Problem: Skin Integrity: Goal: Risk for impaired skin integrity will decrease 08/16/2023 1931 by Kelli Hope, RN Outcome: Progressing 08/16/2023 1931 by Kelli Hope, RN Outcome: Progressing   Problem: Education: Goal: Ability to describe self-care measures that may prevent or decrease complications (Diabetes Survival Skills Education) will improve 08/16/2023 1931 by Kelli Hope, RN Outcome: Progressing 08/16/2023 1931 by Kelli Hope, RN Outcome: Progressing Goal: Individualized Educational Video(s) 08/16/2023 1931 by Kelli Hope, RN Outcome: Progressing 08/16/2023 1931 by Kelli Hope, RN Outcome: Progressing   Problem: Coping: Goal: Ability to adjust to condition or change in health will improve 08/16/2023 1931 by Kelli Hope, RN Outcome: Progressing 08/16/2023 1931 by Kelli Hope, RN Outcome: Progressing   Problem: Fluid Volume: Goal: Ability to maintain a balanced intake and output will improve 08/16/2023 1931 by Kelli Hope, RN Outcome:  Progressing 08/16/2023 1931 by Kelli Hope, RN Outcome: Progressing   Problem: Health Behavior/Discharge Planning: Goal: Ability to identify and utilize available resources and services will improve 08/16/2023 1931 by Kelli Hope, RN Outcome: Progressing 08/16/2023 1931 by Kelli Hope,  RN Outcome: Progressing Goal: Ability to manage health-related needs will improve 08/16/2023 1931 by Kelli Hope, RN Outcome: Progressing 08/16/2023 1931 by Kelli Hope, RN Outcome: Progressing   Problem: Metabolic: Goal: Ability to maintain appropriate glucose levels will improve 08/16/2023 1931 by Kelli Hope, RN Outcome: Progressing 08/16/2023 1931 by Kelli Hope, RN Outcome: Progressing   Problem: Nutritional: Goal: Maintenance of adequate nutrition will improve 08/16/2023 1931 by Kelli Hope, RN Outcome: Progressing 08/16/2023 1931 by Kelli Hope, RN Outcome: Progressing Goal: Progress toward achieving an optimal weight will improve 08/16/2023 1931 by Kelli Hope, RN Outcome: Progressing 08/16/2023 1931 by Kelli Hope, RN Outcome: Progressing   Problem: Skin Integrity: Goal: Risk for impaired skin integrity will decrease 08/16/2023 1931 by Kelli Hope, RN Outcome: Progressing 08/16/2023 1931 by Kelli Hope, RN Outcome: Progressing   Problem: Tissue Perfusion: Goal: Adequacy of tissue perfusion will improve 08/16/2023 1931 by Kelli Hope, RN Outcome: Progressing 08/16/2023 1931 by Kelli Hope, RN Outcome: Progressing   Problem: Education: Goal: Understanding of CV disease, CV risk reduction, and recovery process will improve 08/16/2023 1931 by Kelli Hope, RN Outcome: Progressing 08/16/2023 1931 by Kelli Hope, RN Outcome: Progressing Goal: Individualized Educational Video(s) 08/16/2023 1931 by Kelli Hope, RN Outcome: Progressing 08/16/2023 1931 by Kelli Hope, RN Outcome: Progressing   Problem:  Activity: Goal: Ability to return to baseline activity level will improve 08/16/2023 1931 by Kelli Hope, RN Outcome: Progressing 08/16/2023 1931 by Kelli Hope, RN Outcome: Progressing   Problem: Cardiovascular: Goal: Ability to achieve and maintain adequate cardiovascular perfusion will improve 08/16/2023 1931 by Kelli Hope, RN Outcome: Progressing 08/16/2023 1931 by Kelli Hope, RN Outcome: Progressing Goal: Vascular access site(s) Level 0-1 will be maintained 08/16/2023 1931 by Kelli Hope, RN Outcome: Progressing 08/16/2023 1931 by Kelli Hope, RN Outcome: Progressing   Problem: Health Behavior/Discharge Planning: Goal: Ability to safely manage health-related needs after discharge will improve 08/16/2023 1931 by Kelli Hope, RN Outcome: Progressing 08/16/2023 1931 by Kelli Hope, RN Outcome: Progressing   Kelli Hope, RN

## 2023-08-16 NOTE — Sedation Documentation (Addendum)
2.2 L of fluid removed from right side thoracentesis. Xray post procedure performed

## 2023-08-17 ENCOUNTER — Other Ambulatory Visit: Payer: Self-pay | Admitting: Physician Assistant

## 2023-08-17 ENCOUNTER — Inpatient Hospital Stay (HOSPITAL_COMMUNITY): Payer: Medicare HMO

## 2023-08-17 DIAGNOSIS — Z7189 Other specified counseling: Secondary | ICD-10-CM

## 2023-08-17 DIAGNOSIS — K7460 Unspecified cirrhosis of liver: Secondary | ICD-10-CM

## 2023-08-17 DIAGNOSIS — R531 Weakness: Secondary | ICD-10-CM | POA: Diagnosis not present

## 2023-08-17 DIAGNOSIS — N1831 Chronic kidney disease, stage 3a: Secondary | ICD-10-CM | POA: Diagnosis not present

## 2023-08-17 DIAGNOSIS — Z515 Encounter for palliative care: Secondary | ICD-10-CM | POA: Diagnosis not present

## 2023-08-17 DIAGNOSIS — J810 Acute pulmonary edema: Secondary | ICD-10-CM | POA: Diagnosis not present

## 2023-08-17 DIAGNOSIS — J90 Pleural effusion, not elsewhere classified: Secondary | ICD-10-CM | POA: Diagnosis not present

## 2023-08-17 DIAGNOSIS — K7290 Hepatic failure, unspecified without coma: Secondary | ICD-10-CM | POA: Diagnosis not present

## 2023-08-17 LAB — CBC
HCT: 26.3 % — ABNORMAL LOW (ref 36.0–46.0)
Hemoglobin: 8.6 g/dL — ABNORMAL LOW (ref 12.0–15.0)
MCH: 31.3 pg (ref 26.0–34.0)
MCHC: 32.7 g/dL (ref 30.0–36.0)
MCV: 95.6 fL (ref 80.0–100.0)
Platelets: 92 10*3/uL — ABNORMAL LOW (ref 150–400)
RBC: 2.75 MIL/uL — ABNORMAL LOW (ref 3.87–5.11)
RDW: 15.9 % — ABNORMAL HIGH (ref 11.5–15.5)
WBC: 6.5 10*3/uL (ref 4.0–10.5)
nRBC: 0 % (ref 0.0–0.2)

## 2023-08-17 LAB — COMPREHENSIVE METABOLIC PANEL
ALT: 19 U/L (ref 0–44)
AST: 39 U/L (ref 15–41)
Albumin: 2.6 g/dL — ABNORMAL LOW (ref 3.5–5.0)
Alkaline Phosphatase: 92 U/L (ref 38–126)
Anion gap: 6 (ref 5–15)
BUN: 17 mg/dL (ref 8–23)
CO2: 26 mmol/L (ref 22–32)
Calcium: 7.8 mg/dL — ABNORMAL LOW (ref 8.9–10.3)
Chloride: 104 mmol/L (ref 98–111)
Creatinine, Ser: 0.9 mg/dL (ref 0.44–1.00)
GFR, Estimated: >60 mL/min (ref >60)
Glucose, Bld: 159 mg/dL — ABNORMAL HIGH (ref 70–99)
Potassium: 4 mmol/L (ref 3.5–5.1)
Sodium: 136 mmol/L (ref 135–145)
Total Bilirubin: 1.7 mg/dL — ABNORMAL HIGH (ref 0.3–1.2)
Total Protein: 5.4 g/dL — ABNORMAL LOW (ref 6.5–8.1)

## 2023-08-17 LAB — GLUCOSE, CAPILLARY
Glucose-Capillary: 133 mg/dL — ABNORMAL HIGH (ref 70–99)
Glucose-Capillary: 160 mg/dL — ABNORMAL HIGH (ref 70–99)
Glucose-Capillary: 144 mg/dL — ABNORMAL HIGH (ref 70–99)
Glucose-Capillary: 145 mg/dL — ABNORMAL HIGH (ref 70–99)

## 2023-08-17 LAB — MAGNESIUM: Magnesium: 1.5 mg/dL — ABNORMAL LOW (ref 1.7–2.4)

## 2023-08-17 MED ORDER — ALBUMIN HUMAN 5 % IV SOLN
25.0000 g | Freq: Once | INTRAVENOUS | Status: AC
  Administered 2023-08-17: 25 g via INTRAVENOUS
  Filled 2023-08-17: qty 500

## 2023-08-17 MED ORDER — FUROSEMIDE 10 MG/ML IJ SOLN
40.0000 mg | Freq: Two times a day (BID) | INTRAMUSCULAR | Status: DC
  Administered 2023-08-18 – 2023-08-21 (×7): 40 mg via INTRAVENOUS
  Filled 2023-08-17 (×7): qty 4

## 2023-08-17 MED ORDER — ALBUMIN HUMAN 25 % IV SOLN
12.5000 g | Freq: Once | INTRAVENOUS | Status: AC
  Administered 2023-08-18: 12.5 g via INTRAVENOUS
  Filled 2023-08-17: qty 50

## 2023-08-17 MED ORDER — FUROSEMIDE 10 MG/ML IJ SOLN
40.0000 mg | Freq: Once | INTRAMUSCULAR | Status: AC
  Administered 2023-08-17: 40 mg via INTRAVENOUS
  Filled 2023-08-17: qty 4

## 2023-08-17 NOTE — Progress Notes (Signed)
NAME:  Ariel Arnold, MRN:  [id number], DOB:  [birth year], LOS: 10 ADMISSION DATE:  08/07/2023, CONSULTATION DATE:  08/09/2023 REFERRING MD:  Blake Divine - TRH, CHIEF COMPLAINT:  Post-op hypoxia and hypotension    History of Present Illness:  67 year old woman who presented to [facility] ED 8/13 with SOB. 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  On ED presentation, found to have a right pleural effusion and a new supplemental oxygen requirement. She underwent thoracentesis x 2 (8/14 & 8/16) for concern of hepatic hydrothorax. She was transferred to [facility] 8/19 and underwent TIPS with IR on 8/21. She developed postprocedure hypotension requiring pressor support and hypoxia requiring BiPAP, prompting transfer to ICU. Eventually improved with diminished O2 requirement and was deemed stable for transfer to progressive.  Unfortunately, patient noted to have increasing O2 requirement 8/26-8/27 up to [facility] prompting PCCM reconsult for hepatic hydrothorax management/possible PleurX catheter consideration.  Of note, patient does not have established GI care at present; she was previously seen at [facility] (last 2022) for NASH cirrhosis but was lost to follow up. EGD 05/2021 demonstrated grade 1 esophageal varix, fundal varix with bleeding stigmata, moderately severe PHG and BRTO was recommended.  She was at one point on Lasix and spironolactone as well as Coreg for nonselective beta blockage for portal HTN.  Pertinent Medical History:  NASH cirrhosis with ascites, gastric varices  T2DM HTN PVD  Depression, Anxiety  Significant Hospital Events: Including procedures, antibiotic start and stop dates in addition to other pertinent events   8/13 Presented to [facility] with complaints of shortness of breath 8/14  Underwent thoracentesis [facility] 8/16 Underwent repeat thoracentesis at [facility] 8/19 Transferred to [facility] for interventional radiology evaluation of TIPS procedure 8/21 Underwent TIPS with interventional radiology, postprocedure patient was seen hypotensive and hypoxic resulting in initiation of pressors and PCCM consult 8/22 Weaned to room air  8/23 Increased O2 needs 8/24 BP stable holding midodrine so this was stopped. POCUS showed mod right effusion added lasix. Aline removed. Stable for Progressive. 8/27 PCCM reconsulted for increased O2 requirement. 8/28 TIPS revision, 2L R thoracentesis   Interim History / Subjective:  TIPS revised yesterday as well as 2L right thoracentesis.   Objective:  Blood pressure (!) 100/51, pulse 88, temperature 98.5 F (36.9 C), temperature source Oral, resp. rate 16, height 5\' 8"  (1.727 m), weight 79.6 kg, SpO2 98%.        Intake/Output Summary (Last 24 hours) at 08/17/2023 1209 Last data filed at 08/16/2023 1700 Gross per 24 hour  Intake 250 ml  Output --  Net 250 ml   Filed Weights   08/15/23 0500 08/16/23 0449 08/17/23 0500  Weight: 83 kg 79.6 kg 79.6 kg   Physical Examination: General: frail, chronically ill appearing woman siting up in the recliner in NAD HEENT: [place]/AT, eyes anicteric Neuro: awake, alert, answering questions appropriately, CV: S1S2, RRR PULM: reduced R basilar breath sounds with midlung rhales. Breathing comfortably on [place] GI: ND Extremities: no cyanosis or clubbing Skin: warm, dry  CXR personally reviewed> recurrent R layering effusion, pulmonary edema bilaterally  WBC 3.1 H/H 8.2/25.4 Platelets 65 INR 1.8  Resolved Hospital Problem List:   Hypotension  Assessment & Plan:  Acute hypoxic respiratory failure with recurrent pleural effusion in the setting of hepatic hydrothorax Recurrent R-sided hepatic hydrothorax S/p thoracentesis x  3 [8/14, 8/16, 8/21 (IR, co-therapy with para/TIPS)]. Off of  supplemental O2 8/25; slowly increasing supplemental O2 requirement up to [facility] 8/26-8/27. PCCM re-engaged 8/27. -re-dose lasix & albumin; will need to maximize diuretic regimen. Can use midodrine to support BP.  -wean O2 as able to maintain SpO2 >90% -large volume thoras increase her risk of re-expansion pulmonary edema; recommendations are to not drain > 1-1.5L per day -long term goal should be to control effusions with diuretics; repeat thoracentesis will lead to significant protein wasting -discussed pleurX as an option for palliation, but if she has pleural effusions refractory to diuretics and TIPS, this is an end-stage disease process -appreciate GI's management  NASH Cirrhosis with coagulopathy, thrombocytopenia, ascites S/p TIPS 8/21 with post-procedure hypotension and respiratory distress, otherwise stable neurologic status. TIPS revision on 8/28 -appreciate GI's management -needs fluid & Na+  restriction; needs to have diuretic regimen maximized -recommend zinc -HE regimen- lactulose, rifaximin  CKD stage 3a Hypokalemia -midodrine  Type 2 Diabetes Mellitus -per primary  Hyperlipidemia  -per primary  Depression  Anxiety  - per primary  Met with Ms. Warneke today with [facility]. She understands my concern that she has advanced liver disease and may not have great options if she proves to be diuretic resistant despite TIPS.   Best Practice (right click and "Reselect all SmartList Selections" daily)   Diet/type: Regular DVT prophylaxis: not indicated due to thrombocytopenia  GI prophylaxis: PPI Lines: N/A Foley:  N/A Code Status:  DNR Last date of multidisciplinary goals of care discussion   Signature:  Steffanie Dunn, DO 08/17/23 12:12 PM [facility]  For contact information, see Amion. If no response to pager, please call PCCM consult pager. After hours, 7PM- 7AM, please call Elink.

## 2023-08-17 NOTE — Progress Notes (Signed)
PROGRESS NOTE  Ariel Arnold ZOX:[id number] DOB: December 12, 1956 DOA: 08/07/2023 PCP: Joaquin Courts, DO   LOS: 10 days   Brief Narrative / Interim history: Ariel Arnold is a pleasant 67 y.o. female with medical history significant for NASH cirrhosis with ascites, insulin-dependent diabetes mellitus, depression, anxiety, and right heel ulcer who presented to [facility] on 08/01/2023 with shortness of breath. Patient had first paracentesis approximately a week prior to this presentation.   [facility] Course: Upon arrival to the ED, patient was found to have large right pleural effusion and a new supplemental oxygen requirement.  She was admitted to the hospitalist service and underwent therapeutic thoracenteses on 08/02/2023 and again on 08/04/2023.  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  Dr. Archer Asa of IR agreed to see the patient in consultation for consideration of TIPS.  She was transferred to [facility] under hospitalist for IR evaluation.   She underwent TIPS on procedure on 8/21 with postprocedure hypotension and respiratory distress requiring transfer to ICU for vasopressors.  She was transferred back to [facility] on 08/13/2023. She was on RA on 8/25, but back on 4 lit of [place] oxygen. CXR showing pulmonary edema with right sided pleural effusion.  Underwent thoracentesis, paracentesis as well as TIPS revision on 8/28  Subjective / 24h Interval events: States that overall she is feeling better  Assesement and Plan: Principal Problem:   Decompensated hepatic cirrhosis (HCC) Active Problems:   Acute respiratory failure with hypoxia (HCC)   Recurrent pleural effusion on right   Type 2 diabetes mellitus with diabetic neuropathy, unspecified (HCC)   Thrombocytopenia (HCC)   Essential hypertension   CKD stage G3a/A3, GFR 45-59 and albumin creatinine ratio >300 mg/g (HCC)   Major depressive disorder, single episode, unspecified   Hypomagnesemia   History of peptic  ulcer disease   Mixed hyperlipidemia   Pressure injury of skin   Acute pulmonary edema (HCC)   Pleural effusion   Principal problem Acute hypoxic respiratory failure secondary to recurrent pleural effusion due to hepatic hydrothorax and pulmonary edema - S/p thoracentesis x 3, on 8/14, 8/16 and 8/21, also underwent TIPS on 8/21 -Dyspnea improved, continue supplemental oxygen and wean off to room air as tolerated -Chest x-ray with bilateral airspace disease, possibly edema.  Continue diuretics today.  Appreciate GI and pulm follow-up -IR consulted, TIPS revision done yesterday   Active problems NASH cirrhosis - S/p TIPS on 08/09/2023 with postprocedure hypotension and respiratory distress. Continue with lactulose and rifaximin   Stage IIIa CKD - Creatinine stable at 1.1    Hypokalemia - Replaced. Magnesium level within normal limits.  Continue to monitor  Anemia of chronic disease - From cirrhosis. Hemoglobin around 8.2. transfuse to keep hemoglobin greater than 7.    Chronic thrombocytopenia - From cirrhosis, platelets around 65K.  Monitor   Depression/anxiety - Continue with sertraline.   Hyperlipidemia - Continue with Pravachol   In view of poor progression, deconditioning palliative care consulted, patient declined hospice, wants to go home with [facility].    Lab Results  Component Value Date   HGBA1C 5.0 08/08/2023   CBG (last 3)  Recent Labs    08/16/23 2121 08/17/23 0809 08/17/23 1224  GLUCAP 250* 133* 160*    Scheduled Meds:  Chlorhexidine Gluconate Cloth  6 each Topical Daily   diclofenac Sodium  4 g Topical QID   enoxaparin (LOVENOX) injection  40 mg Subcutaneous Q24H   folic acid  1 mg Oral BID   furosemide  40 mg Intravenous Once   Gerhardt's butt cream   Topical BID   insulin aspart  0-15 Units Subcutaneous TID WC   insulin aspart  0-5 Units Subcutaneous QHS   insulin glargine-yfgn  20 Units Subcutaneous QHS   lactulose  20 g Oral TID   midodrine  5 mg Oral  TID WC   mouth rinse  15 mL Mouth Rinse 4 times per day   pantoprazole  40 mg Oral Daily   pravastatin  20 mg Oral QHS   rifaximin  550 mg Oral BID   sertraline  150 mg Oral Daily   spironolactone  25 mg Oral Daily   Continuous Infusions:  sodium chloride     albumin human     PRN Meds:.albuterol, gabapentin, hydrOXYzine, iohexol, ondansetron **OR** ondansetron (ZOFRAN) IV, mouth rinse, oxyCODONE  Current Outpatient Medications  Medication Instructions   albuterol (VENTOLIN HFA) 108 (90 Base) MCG/ACT inhaler 2 puffs, Inhalation, 2 times daily PRN   ascorbic acid (VITAMIN C) 500 mg, Oral, 2 times daily   Calcium Carbonate-Vit D-Min (CALCIUM 1200) 1200-1000 MG-UNIT CHEW 2 tablets, Oral, Daily   carvedilol (COREG) 6.25 mg, Oral, 2 times daily   clotrimazole-betamethasone (LOTRISONE) cream 1 Application, Topical, Daily PRN   diclofenac Sodium (VOLTAREN) 2 g, Topical, 2 times daily   ferrous sulfate 325 mg, Oral, 2 times daily   folic acid (FOLVITE) 1 mg, Oral, 2 times daily   furosemide (LASIX) 20 mg, Oral, Weekly   gabapentin (NEURONTIN) 600 mg, Oral, 3 times daily PRN   hydrOXYzine (ATARAX) 25 mg, Oral, Daily   insulin aspart (NOVOLOG) 100 UNIT/ML injection 0-20 Units, Subcutaneous, 3 times daily with meal CBG < 70: Implement Hypoglycemia measures CBG 70 - 120: 0 units CBG 121 - 150: 3 units CBG 151 - 200: 4 units CBG 201 - 250: 7 units CBG 251 - 300: 11 units CBG 301 - 350: 15 units CBG 351 - 400: 20 units CBG > 400: call MD   isosorbide mononitrate (IMDUR) 30 MG 24 hr tablet Oral   ketorolac (ACULAR) 0.5 % ophthalmic solution 1 drop, Both Eyes, 4 times daily PRN   lactulose (CHRONULAC) 10 g, Oral, 2 times daily, for ammonia levels   oxyCODONE (OXY IR/ROXICODONE) 5 mg, Oral, Every 6 hours PRN   pantoprazole (PROTONIX) 40 mg, Oral, Daily PRN   pravastatin (PRAVACHOL) 20 mg, Oral, Daily at bedtime, Take after completion of daptomycin   rifaximin (XIFAXAN) 550 mg, Oral, 2 times daily    sertraline (ZOLOFT) 200 mg, Oral, Daily   sodium hypochlorite (DAKIN'S 1/2 STRENGTH) external solution 1 Application, Irrigation, Daily   spironolactone (ALDACTONE) 50 mg, Oral, Daily PRN   Toujeo Max SoloStar 20 Units, Subcutaneous, Daily at bedtime   Vitamin D, Ergocalciferol, (DRISDOL) 1.25 MG (50000 UNIT) CAPS capsule 1 capsule, Oral, Every 7 days, Mondays    Diet Orders (From admission, onward)     Start     Ordered   08/16/23 1555  Diet regular Room service appropriate? Yes; Fluid consistency: Thin  Diet effective now       Question Answer Comment  Room service appropriate? Yes   Fluid consistency: Thin      08/16/23 1555            DVT prophylaxis: enoxaparin (LOVENOX) injection 40 mg Start: 08/10/23 1015 SCDs Start: 08/07/23 1524   Lab Results  Component Value Date   PLT 65 (L) 08/16/2023      Code Status: DNR  Family Communication: no  family at bedside   Status is: Inpatient  Remains inpatient appropriate because: severity of illness  Level of care: Med-Surg  Consultants:  IR GI PCCM Palliative  Objective: Vitals:   08/16/23 2022 08/17/23 0500 08/17/23 0608 08/17/23 0813  BP: (!) 111/58  (!) 109/50 (!) 100/51  Pulse: 91  83 88  Resp:    16  Temp: 98 F (36.7 C)  98.1 F (36.7 C) 98.5 F (36.9 C)  TempSrc:    Oral  SpO2: 97%  100% 98%  Weight:  79.6 kg    Height:        Intake/Output Summary (Last 24 hours) at 08/17/2023 1346 Last data filed at 08/16/2023 1700 Gross per 24 hour  Intake 250 ml  Output --  Net 250 ml   Wt Readings from Last 3 Encounters:  08/17/23 79.6 kg  08/21/22 86.7 kg  06/02/22 85.6 kg    Examination:  Constitutional: NAD Eyes: lids and conjunctivae normal, no scleral icterus ENMT: mmm Neck: normal, supple Respiratory: clear to auscultation bilaterally, no wheezing, no crackles. Diminished at the bases Cardiovascular: Regular rate and rhythm, no murmurs / rubs / gallops. No LE edema. Abdomen: soft, no  distention, no tenderness. Bowel sounds positive.   Data Reviewed: I have independently reviewed following labs and imaging studies   CBC Recent Labs  Lab 08/11/23 0511 08/13/23 0411 08/14/23 0416 08/15/23 0319 08/16/23 1230  WBC 5.1 5.5 3.2* 3.3* 3.1*  HGB 7.9* 8.9* 7.4* 8.2* 8.2*  HCT 23.8* 27.6* 22.7* 25.4* 25.4*  PLT 65* 87* 60* 63* 65*  MCV 95.2 96.2 97.0 99.2 99.2  MCH 31.6 31.0 31.6 32.0 32.0  MCHC 33.2 32.2 32.6 32.3 32.3  RDW 15.3 15.0 15.0 15.3 15.4  LYMPHSABS  --   --  1.0 1.0  --   MONOABS  --   --  0.3 0.3  --   EOSABS  --   --  0.3 0.3  --   BASOSABS  --   --  0.0 0.0  --     Recent Labs  Lab 08/11/23 0511 08/12/23 0913 08/13/23 0411 08/14/23 0416 08/15/23 0319 08/16/23 1230  NA 136 138 139 139 139 140  K 4.2 3.2* 4.1 3.4* 4.0 3.6  CL 104 104 107 106 106 105  CO2 23 26 24 28 26 30   GLUCOSE 103* 158* 162* 138* 165* 96  BUN 30* 33* 33* 35* 28* 21  CREATININE 1.10* 1.16* 1.25* 1.17* 1.04* 0.94  CALCIUM 7.9* 8.0* 8.2* 8.1* 8.0* 8.1*  AST  --   --   --  28  --  30  ALT  --   --   --  15  --  17  ALKPHOS  --   --   --  56  --  70  BILITOT  --   --   --  1.9*  --  1.0  ALBUMIN  --   --   --  2.4*  --  2.6*  MG 2.2 1.9  --   --   --   --   INR  --   --   --   --   --  1.8*  AMMONIA 57* 31  --   --   --   --   BNP  --   --   --   --  353.4*  --     ------------------------------------------------------------------------------------------------------------------ No results for input(s): "CHOL", "HDL", "LDLCALC", "TRIG", "CHOLHDL", "LDLDIRECT" in the last 72 hours.  Lab Results  Component Value Date   HGBA1C 5.0 08/08/2023   ------------------------------------------------------------------------------------------------------------------ No results for input(s): "TSH", "T4TOTAL", "T3FREE", "THYROIDAB" in the last 72 hours.  Invalid input(s): "FREET3"  Cardiac Enzymes No results for input(s): "CKMB", "TROPONINI", "MYOGLOBIN" in the last 168  hours.  Invalid input(s): "CK" ------------------------------------------------------------------------------------------------------------------    Component Value Date/Time   BNP 353.4 (H) 08/15/2023 0319    CBG: Recent Labs  Lab 08/16/23 1740 08/16/23 2022 08/16/23 2121 08/17/23 0809 08/17/23 1224  GLUCAP 82 144* 250* 133* 160*    Recent Results (from the past 240 hour(s))  MRSA Next Gen by PCR, Nasal     Status: Abnormal   Collection Time: 08/09/23 11:11 AM   Specimen: Nasal Mucosa; Nasal Swab  Result Value Ref Range Status   MRSA by PCR Next Gen DETECTED (A) NOT DETECTED Final    Comment: RESULT CALLED TO, READ BACK BY AND VERIFIED WITH: RN DILLIN.K AT 1409 ON 08/09/2023 BY T.SAAD. (NOTE) The GeneXpert MRSA Assay (FDA approved for NASAL specimens only), is one component of a comprehensive MRSA colonization surveillance program. It is not intended to diagnose MRSA infection nor to guide or monitor treatment for MRSA infections. Test performance is not FDA approved in patients less than 51 years old. Performed at [facility], [address] [place], [place] [address]      Radiology Studies: DG CHEST PORT 1 VIEW  Result Date: 08/17/2023 CLINICAL DATA:  [id number] Pleural effusion associated with hepatic disorder 09811 EXAM: PORTABLE CHEST 1 VIEW COMPARISON:  08/16/2023 FINDINGS: Heart and mediastinal contours within normal limits. Aortic atherosclerosis. Diffuse interstitial and airspace opacities, right greater than left. Moderate layering right pleural effusion. Airspace disease and effusion have worsened since prior study. IMPRESSION: Worsening interstitial and airspace opacities, right greater than left could reflect asymmetric edema or infection. Moderate right pleural effusion. Electronically Signed   By: Charlett Nose M.D.   On: 08/17/2023 10:28   IR TIPS REVISION MOD SED  Result Date: 08/16/2023 CLINICAL DATA:  Hepatic hydrothorax, status post TIPS creation  08/09/2023. There is subsequent reaccumulation of right pleural effusion. EXAM: TIPS REVISION; IR THORACENTESIS ASP PLEURAL SPACE W/IMG GUIDE; ULTRASOUND ABDOMEN LIMITED; IR ULTRASOUND GUIDANCE VASC ACCESS RIGHT ANESTHESIA/SEDATION: Intravenous Fentanyl and Versed 0.5mg  were administered as conscious sedation during continuous monitoring of the patient's level of consciousness and physiological / cardiorespiratory status by the radiology RN, with a total moderate sedation time of 75 minutes. MEDICATIONS: Lidocaine 1% subcutaneous CONTRAST:  30mL OMNIPAQUE IOHEXOL 300 MG/ML  SOLN PROCEDURE: The procedure, risks (including but not limited to bleeding, infection, organ damage ), benefits, and alternatives were explained to the patient. Questions regarding the procedure were encouraged and answered. The patient understands and consents to the procedure. Survey ultrasound of the right hemithorax was performed, moderate recurrent pleural effusion identified, and an appropriate skin entry site was localized. Site was marked, prepped with chlorhexidine, draped in usual sterile fashion, infiltrated locally with 1% lidocaine. The Saf-T-Centesis needle was advanced into the pleural space. Clear yellow pleural fluid returned. 2.2 L was removed. Post procedure imaging shows no residual fluid. The patient tolerated procedure well. Subsequently, right IJ region prepped and draped in usual sterile fashion. Maximal barrier sterile technique was utilized including caps, mask, sterile gowns, sterile gloves, sterile drape, hand hygiene and skin antiseptic. Under real-time ultrasound guidance, right IJ vein accessed with micropuncture set, exchanged over a Bentson wire for a 7 French 35 cm vascular sheath. Through this, a 5 [place] Kumpe catheter  advanced and used to catheterize the right hepatic vein, and TIPS into the native portal venous system. TIPS venogram was obtained. Pressure measurements in the portal vein and at the  right atrium level were obtained. Catheter was exchanged over a Rosen wire for an 8 mm x 4 cm Conquest angioplasty balloon, and balloon angioplasty of the TIPS stent and tract was performed. Pressure measurements in the portal vein and at the right atrium level were obtained. Subsequently, catheter exchanged for a 10 mm by 4 cm Conquest angioplasty balloon, and further balloon angioplasty of the TIPS stent and tract was performed. Pressure measurements in the portal vein and at the right atrium level were obtained. A final TIPS venogram was obtained. Catheter and sheath were removed and hemostasis achieved at the right IJ site with manual compression. Survey ultrasound of the abdomen showed a small amount of scattered abdominal ascites. Paracentesis was therefore deferred. The patient tolerated the procedure well. COMPLICATIONS: None immediate FINDINGS: Moderate right pleural effusion identified. 2.2 L thoracentesis performed. Initial TIPS venogram shows wide patency of the shunt. There is antegrade flow in the main portal vein, and hepatopetal flow in the right and left portal vein branches. No hepatic vein stenosis. Pressure measurements portal vein mean 17 mm Hg, right atrium mean 7 mmHg (gradient 10 mmHg). The TIPS stent and tract were dilated along entire length with the 8 mm balloon with overlapping inflations. Post pressures portal mean 15 mm Hg, right atrium 7 mmHg (gradient 8 mmHg). After further dilatation to 10 mm, post pressures portal mean 16 mm Hg, right atrium 10 mmHg (gradient 6 mmHg). Post TIPS venogram shows excellent flow through the stent. Continued hepatopetal flow in right and left portal branches. No hepatic vein stenosis. IMPRESSION: 1. TIPS remains patent, with 10 mmHg portosystemic gradient, increased from 4 mm gradient reported at the time of deployment. 2. Balloon angioplasty of TIPS stent and tract using 8 and 10 mm balloons with final portosystemic gradient 6 mmHg. 3. 2.2 L pleural  fluid removed during right thoracentesis. 4. Small volume residual/recurrent abdominal ascites; paracentesis deferred. Electronically Signed   By: Corlis Leak M.D.   On: 08/16/2023 15:46   IR THORACENTESIS ASP PLEURAL SPACE W/IMG GUIDE  Result Date: 08/16/2023 CLINICAL DATA:  Hepatic hydrothorax, status post TIPS creation 08/09/2023. There is subsequent reaccumulation of right pleural effusion. EXAM: TIPS REVISION; IR THORACENTESIS ASP PLEURAL SPACE W/IMG GUIDE; ULTRASOUND ABDOMEN LIMITED; IR ULTRASOUND GUIDANCE VASC ACCESS RIGHT ANESTHESIA/SEDATION: Intravenous Fentanyl and Versed 0.5mg  were administered as conscious sedation during continuous monitoring of the patient's level of consciousness and physiological / cardiorespiratory status by the radiology RN, with a total moderate sedation time of 75 minutes. MEDICATIONS: Lidocaine 1% subcutaneous CONTRAST:  30mL OMNIPAQUE IOHEXOL 300 MG/ML  SOLN PROCEDURE: The procedure, risks (including but not limited to bleeding, infection, organ damage ), benefits, and alternatives were explained to the patient. Questions regarding the procedure were encouraged and answered. The patient understands and consents to the procedure. Survey ultrasound of the right hemithorax was performed, moderate recurrent pleural effusion identified, and an appropriate skin entry site was localized. Site was marked, prepped with chlorhexidine, draped in usual sterile fashion, infiltrated locally with 1% lidocaine. The Saf-T-Centesis needle was advanced into the pleural space. Clear yellow pleural fluid returned. 2.2 L was removed. Post procedure imaging shows no residual fluid. The patient tolerated procedure well. Subsequently, right IJ region prepped and draped in usual sterile fashion. Maximal barrier sterile technique was utilized including caps, mask, sterile gowns,  sterile gloves, sterile drape, hand hygiene and skin antiseptic. Under real-time ultrasound guidance, right IJ vein  accessed with micropuncture set, exchanged over a Bentson wire for a 7 French 35 cm vascular sheath. Through this, a 5 [place] Kumpe catheter advanced and used to catheterize the right hepatic vein, and TIPS into the native portal venous system. TIPS venogram was obtained. Pressure measurements in the portal vein and at the right atrium level were obtained. Catheter was exchanged over a Rosen wire for an 8 mm x 4 cm Conquest angioplasty balloon, and balloon angioplasty of the TIPS stent and tract was performed. Pressure measurements in the portal vein and at the right atrium level were obtained. Subsequently, catheter exchanged for a 10 mm by 4 cm Conquest angioplasty balloon, and further balloon angioplasty of the TIPS stent and tract was performed. Pressure measurements in the portal vein and at the right atrium level were obtained. A final TIPS venogram was obtained. Catheter and sheath were removed and hemostasis achieved at the right IJ site with manual compression. Survey ultrasound of the abdomen showed a small amount of scattered abdominal ascites. Paracentesis was therefore deferred. The patient tolerated the procedure well. COMPLICATIONS: None immediate FINDINGS: Moderate right pleural effusion identified. 2.2 L thoracentesis performed. Initial TIPS venogram shows wide patency of the shunt. There is antegrade flow in the main portal vein, and hepatopetal flow in the right and left portal vein branches. No hepatic vein stenosis. Pressure measurements portal vein mean 17 mm Hg, right atrium mean 7 mmHg (gradient 10 mmHg). The TIPS stent and tract were dilated along entire length with the 8 mm balloon with overlapping inflations. Post pressures portal mean 15 mm Hg, right atrium 7 mmHg (gradient 8 mmHg). After further dilatation to 10 mm, post pressures portal mean 16 mm Hg, right atrium 10 mmHg (gradient 6 mmHg). Post TIPS venogram shows excellent flow through the stent. Continued hepatopetal flow in right  and left portal branches. No hepatic vein stenosis. IMPRESSION: 1. TIPS remains patent, with 10 mmHg portosystemic gradient, increased from 4 mm gradient reported at the time of deployment. 2. Balloon angioplasty of TIPS stent and tract using 8 and 10 mm balloons with final portosystemic gradient 6 mmHg. 3. 2.2 L pleural fluid removed during right thoracentesis. 4. Small volume residual/recurrent abdominal ascites; paracentesis deferred. Electronically Signed   By: Corlis Leak M.D.   On: 08/16/2023 15:46   IR US Guide Vasc Access Right  Result Date: 08/16/2023 CLINICAL DATA:  Hepatic hydrothorax, status post TIPS creation 08/09/2023. There is subsequent reaccumulation of right pleural effusion. EXAM: TIPS REVISION; IR THORACENTESIS ASP PLEURAL SPACE W/IMG GUIDE; ULTRASOUND ABDOMEN LIMITED; IR ULTRASOUND GUIDANCE VASC ACCESS RIGHT ANESTHESIA/SEDATION: Intravenous Fentanyl and Versed 0.5mg  were administered as conscious sedation during continuous monitoring of the patient's level of consciousness and physiological / cardiorespiratory status by the radiology RN, with a total moderate sedation time of 75 minutes. MEDICATIONS: Lidocaine 1% subcutaneous CONTRAST:  30mL OMNIPAQUE IOHEXOL 300 MG/ML  SOLN PROCEDURE: The procedure, risks (including but not limited to bleeding, infection, organ damage ), benefits, and alternatives were explained to the patient. Questions regarding the procedure were encouraged and answered. The patient understands and consents to the procedure. Survey ultrasound of the right hemithorax was performed, moderate recurrent pleural effusion identified, and an appropriate skin entry site was localized. Site was marked, prepped with chlorhexidine, draped in usual sterile fashion, infiltrated locally with 1% lidocaine. The Saf-T-Centesis needle was advanced into the pleural space. Clear yellow pleural fluid  returned. 2.2 L was removed. Post procedure imaging shows no residual fluid. The  patient tolerated procedure well. Subsequently, right IJ region prepped and draped in usual sterile fashion. Maximal barrier sterile technique was utilized including caps, mask, sterile gowns, sterile gloves, sterile drape, hand hygiene and skin antiseptic. Under real-time ultrasound guidance, right IJ vein accessed with micropuncture set, exchanged over a Bentson wire for a 7 French 35 cm vascular sheath. Through this, a 5 [place] Kumpe catheter advanced and used to catheterize the right hepatic vein, and TIPS into the native portal venous system. TIPS venogram was obtained. Pressure measurements in the portal vein and at the right atrium level were obtained. Catheter was exchanged over a Rosen wire for an 8 mm x 4 cm Conquest angioplasty balloon, and balloon angioplasty of the TIPS stent and tract was performed. Pressure measurements in the portal vein and at the right atrium level were obtained. Subsequently, catheter exchanged for a 10 mm by 4 cm Conquest angioplasty balloon, and further balloon angioplasty of the TIPS stent and tract was performed. Pressure measurements in the portal vein and at the right atrium level were obtained. A final TIPS venogram was obtained. Catheter and sheath were removed and hemostasis achieved at the right IJ site with manual compression. Survey ultrasound of the abdomen showed a small amount of scattered abdominal ascites. Paracentesis was therefore deferred. The patient tolerated the procedure well. COMPLICATIONS: None immediate FINDINGS: Moderate right pleural effusion identified. 2.2 L thoracentesis performed. Initial TIPS venogram shows wide patency of the shunt. There is antegrade flow in the main portal vein, and hepatopetal flow in the right and left portal vein branches. No hepatic vein stenosis. Pressure measurements portal vein mean 17 mm Hg, right atrium mean 7 mmHg (gradient 10 mmHg). The TIPS stent and tract were dilated along entire length with the 8 mm balloon with  overlapping inflations. Post pressures portal mean 15 mm Hg, right atrium 7 mmHg (gradient 8 mmHg). After further dilatation to 10 mm, post pressures portal mean 16 mm Hg, right atrium 10 mmHg (gradient 6 mmHg). Post TIPS venogram shows excellent flow through the stent. Continued hepatopetal flow in right and left portal branches. No hepatic vein stenosis. IMPRESSION: 1. TIPS remains patent, with 10 mmHg portosystemic gradient, increased from 4 mm gradient reported at the time of deployment. 2. Balloon angioplasty of TIPS stent and tract using 8 and 10 mm balloons with final portosystemic gradient 6 mmHg. 3. 2.2 L pleural fluid removed during right thoracentesis. 4. Small volume residual/recurrent abdominal ascites; paracentesis deferred. Electronically Signed   By: Corlis Leak M.D.   On: 08/16/2023 15:46   IR ABDOMEN US LIMITED  Result Date: 08/16/2023 CLINICAL DATA:  Hepatic hydrothorax, status post TIPS creation 08/09/2023. There is subsequent reaccumulation of right pleural effusion. EXAM: TIPS REVISION; IR THORACENTESIS ASP PLEURAL SPACE W/IMG GUIDE; ULTRASOUND ABDOMEN LIMITED; IR ULTRASOUND GUIDANCE VASC ACCESS RIGHT ANESTHESIA/SEDATION: Intravenous Fentanyl and Versed 0.5mg  were administered as conscious sedation during continuous monitoring of the patient's level of consciousness and physiological / cardiorespiratory status by the radiology RN, with a total moderate sedation time of 75 minutes. MEDICATIONS: Lidocaine 1% subcutaneous CONTRAST:  30mL OMNIPAQUE IOHEXOL 300 MG/ML  SOLN PROCEDURE: The procedure, risks (including but not limited to bleeding, infection, organ damage ), benefits, and alternatives were explained to the patient. Questions regarding the procedure were encouraged and answered. The patient understands and consents to the procedure. Survey ultrasound of the right hemithorax was performed, moderate recurrent pleural effusion identified,  and an appropriate skin entry site was  localized. Site was marked, prepped with chlorhexidine, draped in usual sterile fashion, infiltrated locally with 1% lidocaine. The Saf-T-Centesis needle was advanced into the pleural space. Clear yellow pleural fluid returned. 2.2 L was removed. Post procedure imaging shows no residual fluid. The patient tolerated procedure well. Subsequently, right IJ region prepped and draped in usual sterile fashion. Maximal barrier sterile technique was utilized including caps, mask, sterile gowns, sterile gloves, sterile drape, hand hygiene and skin antiseptic. Under real-time ultrasound guidance, right IJ vein accessed with micropuncture set, exchanged over a Bentson wire for a 7 French 35 cm vascular sheath. Through this, a 5 [place] Kumpe catheter advanced and used to catheterize the right hepatic vein, and TIPS into the native portal venous system. TIPS venogram was obtained. Pressure measurements in the portal vein and at the right atrium level were obtained. Catheter was exchanged over a Rosen wire for an 8 mm x 4 cm Conquest angioplasty balloon, and balloon angioplasty of the TIPS stent and tract was performed. Pressure measurements in the portal vein and at the right atrium level were obtained. Subsequently, catheter exchanged for a 10 mm by 4 cm Conquest angioplasty balloon, and further balloon angioplasty of the TIPS stent and tract was performed. Pressure measurements in the portal vein and at the right atrium level were obtained. A final TIPS venogram was obtained. Catheter and sheath were removed and hemostasis achieved at the right IJ site with manual compression. Survey ultrasound of the abdomen showed a small amount of scattered abdominal ascites. Paracentesis was therefore deferred. The patient tolerated the procedure well. COMPLICATIONS: None immediate FINDINGS: Moderate right pleural effusion identified. 2.2 L thoracentesis performed. Initial TIPS venogram shows wide patency of the shunt. There is antegrade  flow in the main portal vein, and hepatopetal flow in the right and left portal vein branches. No hepatic vein stenosis. Pressure measurements portal vein mean 17 mm Hg, right atrium mean 7 mmHg (gradient 10 mmHg). The TIPS stent and tract were dilated along entire length with the 8 mm balloon with overlapping inflations. Post pressures portal mean 15 mm Hg, right atrium 7 mmHg (gradient 8 mmHg). After further dilatation to 10 mm, post pressures portal mean 16 mm Hg, right atrium 10 mmHg (gradient 6 mmHg). Post TIPS venogram shows excellent flow through the stent. Continued hepatopetal flow in right and left portal branches. No hepatic vein stenosis. IMPRESSION: 1. TIPS remains patent, with 10 mmHg portosystemic gradient, increased from 4 mm gradient reported at the time of deployment. 2. Balloon angioplasty of TIPS stent and tract using 8 and 10 mm balloons with final portosystemic gradient 6 mmHg. 3. 2.2 L pleural fluid removed during right thoracentesis. 4. Small volume residual/recurrent abdominal ascites; paracentesis deferred. Electronically Signed   By: Corlis Leak M.D.   On: 08/16/2023 15:46   DG Chest Port 1 View  Result Date: 08/16/2023 CLINICAL DATA:  Right pleural effusion post thoracentesis EXAM: PORTABLE CHEST - 1 VIEW COMPARISON:  the previous day's study FINDINGS: No pneumothorax. Resolution of right pleural effusion. Improved aeration at the right lung base. Coarse interstitial and alveolar opacities throughout both lungs. Patchy airspace opacities in the left lung base slightly improved. Heart size and mediastinal contours are within normal limits. No effusion. Visualized bones unremarkable. Left upper quadrant embolization coils. IMPRESSION: 1. Resolution of right pleural effusion. No pneumothorax. 2. Persistent coarse interstitial and alveolar opacities throughout both lungs. Electronically Signed   By: Ronald Pippins.D.  On: 08/16/2023 14:37     Pamella Pert, MD, PhD [facility]  Between 7 am - 7 pm I am available, please contact me via Amion (for emergencies) or Securechat (non urgent messages)  Between 7 pm - 7 am I am not available, please contact night coverage MD/APP via Amion

## 2023-08-17 NOTE — Plan of Care (Signed)

## 2023-08-17 NOTE — Progress Notes (Signed)
This chaplain responded to PMT consult for creating/updating the Pt. Advance Directive:  HCPOA and Living Will. The Pt. participated in AD education and answered clarifying questions with the chaplain.  The Pt. chooses Carolan Shiver and [name] as her her healthcare agents. The chaplain understands the healthcare agents will share medical decision making. The Pt. has written she does not want to be cremated.  The chaplain gave the Pt. the original AD along with two copies. The chaplain scanned the Pt. AD into the Pt. EMR.  This chaplain is available for F/U spiritual care as needed.  Chaplain Stephanie Acre [phone]

## 2023-08-17 NOTE — Progress Notes (Signed)
Physical Therapy Treatment Patient Details Name: Ariel Arnold MRN: [id number] DOB: 1956/04/24 Today's Date: 08/17/2023   History of Present Illness 67 yo female admitted to [facility] 8/13 with SOB with Rt pleural effusion. 8/14 & 8/16 thoracentesis. 8/19 transfer to [facility]. 8/21 TIPS procedure with post procedure hypotension. Plan for thoracentesis 8/23. s/p 8/28 thoracentesis and TIPS revision. PMHx; NASH cirrhosis with ascites, T2DM, HTN, PVD, osteomyelitis, depression, anxiety, Lt BKA    PT Comments  Pt reports feeling much better after procedures yesterday and exhibits increased strength with mobility today. Pt is min Ax2 for bed mobility, and minA-modAx2 for standing transfers to [facility] from elevated bed and lower BSC. D/c plans remain appropriate. PT will continue to follow acutely.   If plan is discharge home, recommend the following: A lot of help with walking and/or transfers;A lot of help with bathing/dressing/bathroom;Assistance with cooking/housework;Assist for transportation   Can travel by private vehicle     No  Equipment Recommendations  None recommended by PT       Precautions / Restrictions Precautions Precautions: Fall;Other (comment) Precaution Comments: Lt BKA Restrictions Weight Bearing Restrictions: Yes Other Position/Activity Restrictions: hx of L BKA     Mobility  Bed Mobility Overal bed mobility: Needs Assistance Bed Mobility: Supine to Sit Rolling:  (with HOB elevated and use fo bed rails)   Supine to sit: Min assist, HOB elevated, Used rails     General bed mobility comments: supervision for safety vc for sequencing to scoot herself to the EoB    Transfers Overall transfer level: Needs assistance Equipment used: Ambulation equipment used Transfers: Sit to/from Stand Sit to Stand: Via lift equipment, +2 physical assistance, Min assist, Mod assist           General transfer comment: Stedy transfer from bed to [facility] and BSC to recliner,  improved strength today and able to come to standing from elevated bed with min Ax2 and from lower BSC with modAx2       Balance Overall balance assessment: Needs assistance   Sitting balance-Leahy Scale: Fair     Standing balance support: Bilateral upper extremity supported Standing balance-Leahy Scale: Poor                              Cognition Arousal: Alert Behavior During Therapy: WFL for tasks assessed/performed                                 Problem Solving: Slow processing General Comments: decreased awareness of gravity of her situation but overall better cognition           General Comments General comments (skin integrity, edema, etc.): VSS on 4.5L O2 via [place]      Pertinent Vitals/Pain Pain Assessment Pain Assessment: Faces Faces Pain Scale: Hurts a little bit Pain Location: generalized with movement Pain Intervention(s): Limited activity within patient's tolerance, Monitored during session, Repositioned     PT Goals (current goals can now be found in the care plan section) Acute Rehab PT Goals PT Goal Formulation: With patient Time For Goal Achievement: 08/24/23 Potential to Achieve Goals: Fair Progress towards PT goals: Progressing toward goals    Frequency    Min 1X/week       AM-PAC PT "6 Clicks" Mobility   Outcome Measure  Help needed turning from your back to your side while in a flat bed without using  bedrails?: A Little Help needed moving from lying on your back to sitting on the side of a flat bed without using bedrails?: A Little Help needed moving to and from a bed to a chair (including a wheelchair)?: A Lot Help needed standing up from a chair using your arms (e.g., wheelchair or bedside chair)?: A Lot Help needed to walk in hospital room?: Total Help needed climbing 3-5 steps with a railing? : Total 6 Click Score: 12    End of Session Equipment Utilized During Treatment: Oxygen;Gait belt Activity  Tolerance: Patient tolerated treatment well Patient left: in chair;with call bell/phone within reach;with chair alarm set Nurse Communication: Mobility status PT Visit Diagnosis: Other abnormalities of gait and mobility (R26.89);Muscle weakness (generalized) (M62.81)     Time: 1610-9604 PT Time Calculation (min) (ACUTE ONLY): 26 min  Charges:    $Therapeutic Activity: 23-37 mins PT General Charges $$ ACUTE PT VISIT: 1 Visit                     Nakeem Murnane B. Beverely Risen PT, DPT Acute Rehabilitation Services Please use secure chat or  Call Office [phone]    Elon Alas [name] 08/17/2023, 1:41 PM

## 2023-08-17 NOTE — Progress Notes (Addendum)
Attending physician's note   I have taken a history, reviewed the chart, and examined the patient. I performed a substantive portion of this encounter, including complete performance of at least one of the key components, in conjunction with the APP. I agree with the APP's note, impression, and recommendations with my edits.   Underwent TIPS angioplasty yesterday.  She reports feeling better today.  Slept well last night.  Repeat CXR again with R>L pulmonary edema and right pleural effusion.  Discussed her care with the IR and Critical Care service along with her primary Hospitalist.  Feel that she still needs continued aggressive diuresis.  - Lasix 40 mg IV BID today and likely tomorrow (pending renal tolerance) - IV albumin - Continue spironolactone - Continue midodrine - May need to consider Bumex depending on response to therapy +/- Nephrology involvement - Continue weaning O2 as tolerated - Agree with CCS about avoiding large volume paracentesis.  No Pleurx cath at this juncture - Continue lactulose/rifaximin  Valisa Karpel, DO, FACG [phone] office          Progress Note   LOS: 10 days   Chief Complaint: MASH cirrhosis s/p TIPS revision   Subjective   Patient states she is doing great today. Slept better than she has in a long time. Tolerating diet without difficulty. Denies pain. Denies nausea/vomiting.   Objective   Vital signs in last 24 hours: Temp:  [98 F (36.7 C)-98.5 F (36.9 C)] 98.5 F (36.9 C) (08/29 0813) Pulse Rate:  [83-91] 88 (08/29 0813) Resp:  [12-22] 16 (08/29 0813) BP: (100-128)/(49-88) 100/51 (08/29 0813) SpO2:  [93 %-100 %] 98 % (08/29 0813) Weight:  [79.6 kg] 79.6 kg (08/29 0500) Last BM Date : 08/15/23 Last BM recorded by nurses in past 5 days Stool Type: Type 6 (Mushy consistency with ragged edges) (08/15/2023  7:52 PM)  General:   female in no acute distress  Heart:  Regular rate and rhythm; no murmurs Pulm: Clear  anteriorly; no wheezing Abdomen: soft, nondistended, normal bowel sounds in all quadrants. Nontender without guarding. No organomegaly appreciated. Extremities:  No edema Neurologic:  Alert and  oriented x4;  No focal deficits.  Psych:  Cooperative. Normal mood and affect.  Intake/Output from previous day: 08/28 0701 - 08/29 0700 In: 250 [P.O.:250] Out: -  Intake/Output this shift: No intake/output data recorded.  Studies/Results: DG CHEST PORT 1 VIEW  Result Date: 08/17/2023 CLINICAL DATA:  06301 Pleural effusion associated with hepatic disorder 60109 EXAM: PORTABLE CHEST 1 VIEW COMPARISON:  08/16/2023 FINDINGS: Heart and mediastinal contours within normal limits. Aortic atherosclerosis. Diffuse interstitial and airspace opacities, right greater than left. Moderate layering right pleural effusion. Airspace disease and effusion have worsened since prior study. IMPRESSION: Worsening interstitial and airspace opacities, right greater than left could reflect asymmetric edema or infection. Moderate right pleural effusion. Electronically Signed   By: Charlett Nose M.D.   On: 08/17/2023 10:28   IR TIPS REVISION MOD SED  Result Date: 08/16/2023 CLINICAL DATA:  Hepatic hydrothorax, status post TIPS creation 08/09/2023. There is subsequent reaccumulation of right pleural effusion. EXAM: TIPS REVISION; IR THORACENTESIS ASP PLEURAL SPACE W/IMG GUIDE; ULTRASOUND ABDOMEN LIMITED; IR ULTRASOUND GUIDANCE VASC ACCESS RIGHT ANESTHESIA/SEDATION: Intravenous Fentanyl and Versed 0.5mg  were administered as conscious sedation during continuous monitoring of the patient's level of consciousness and physiological / cardiorespiratory status by the radiology RN, with a total moderate sedation time of 75 minutes. MEDICATIONS: Lidocaine 1% subcutaneous CONTRAST:  30mL OMNIPAQUE IOHEXOL  300 MG/ML  SOLN PROCEDURE: The procedure, risks (including but not limited to bleeding, infection, organ damage ), benefits, and  alternatives were explained to the patient. Questions regarding the procedure were encouraged and answered. The patient understands and consents to the procedure. Survey ultrasound of the right hemithorax was performed, moderate recurrent pleural effusion identified, and an appropriate skin entry site was localized. Site was marked, prepped with chlorhexidine, draped in usual sterile fashion, infiltrated locally with 1% lidocaine. The Saf-T-Centesis needle was advanced into the pleural space. Clear yellow pleural fluid returned. 2.2 L was removed. Post procedure imaging shows no residual fluid. The patient tolerated procedure well. Subsequently, right IJ region prepped and draped in usual sterile fashion. Maximal barrier sterile technique was utilized including caps, mask, sterile gowns, sterile gloves, sterile drape, hand hygiene and skin antiseptic. Under real-time ultrasound guidance, right IJ vein accessed with micropuncture set, exchanged over a Bentson wire for a 7 French 35 cm vascular sheath. Through this, a 5 [place] Kumpe catheter advanced and used to catheterize the right hepatic vein, and TIPS into the native portal venous system. TIPS venogram was obtained. Pressure measurements in the portal vein and at the right atrium level were obtained. Catheter was exchanged over a Rosen wire for an 8 mm x 4 cm Conquest angioplasty balloon, and balloon angioplasty of the TIPS stent and tract was performed. Pressure measurements in the portal vein and at the right atrium level were obtained. Subsequently, catheter exchanged for a 10 mm by 4 cm Conquest angioplasty balloon, and further balloon angioplasty of the TIPS stent and tract was performed. Pressure measurements in the portal vein and at the right atrium level were obtained. A final TIPS venogram was obtained. Catheter and sheath were removed and hemostasis achieved at the right IJ site with manual compression. Survey ultrasound of the abdomen showed a small  amount of scattered abdominal ascites. Paracentesis was therefore deferred. The patient tolerated the procedure well. COMPLICATIONS: None immediate FINDINGS: Moderate right pleural effusion identified. 2.2 L thoracentesis performed. Initial TIPS venogram shows wide patency of the shunt. There is antegrade flow in the main portal vein, and hepatopetal flow in the right and left portal vein branches. No hepatic vein stenosis. Pressure measurements portal vein mean 17 mm Hg, right atrium mean 7 mmHg (gradient 10 mmHg). The TIPS stent and tract were dilated along entire length with the 8 mm balloon with overlapping inflations. Post pressures portal mean 15 mm Hg, right atrium 7 mmHg (gradient 8 mmHg). After further dilatation to 10 mm, post pressures portal mean 16 mm Hg, right atrium 10 mmHg (gradient 6 mmHg). Post TIPS venogram shows excellent flow through the stent. Continued hepatopetal flow in right and left portal branches. No hepatic vein stenosis. IMPRESSION: 1. TIPS remains patent, with 10 mmHg portosystemic gradient, increased from 4 mm gradient reported at the time of deployment. 2. Balloon angioplasty of TIPS stent and tract using 8 and 10 mm balloons with final portosystemic gradient 6 mmHg. 3. 2.2 L pleural fluid removed during right thoracentesis. 4. Small volume residual/recurrent abdominal ascites; paracentesis deferred. Electronically Signed   By: Corlis Leak M.D.   On: 08/16/2023 15:46   IR THORACENTESIS ASP PLEURAL SPACE W/IMG GUIDE  Result Date: 08/16/2023 CLINICAL DATA:  Hepatic hydrothorax, status post TIPS creation 08/09/2023. There is subsequent reaccumulation of right pleural effusion. EXAM: TIPS REVISION; IR THORACENTESIS ASP PLEURAL SPACE W/IMG GUIDE; ULTRASOUND ABDOMEN LIMITED; IR ULTRASOUND GUIDANCE VASC ACCESS RIGHT ANESTHESIA/SEDATION: Intravenous Fentanyl and Versed 0.5mg  were  administered as conscious sedation during continuous monitoring of the patient's level of consciousness  and physiological / cardiorespiratory status by the radiology RN, with a total moderate sedation time of 75 minutes. MEDICATIONS: Lidocaine 1% subcutaneous CONTRAST:  30mL OMNIPAQUE IOHEXOL 300 MG/ML  SOLN PROCEDURE: The procedure, risks (including but not limited to bleeding, infection, organ damage ), benefits, and alternatives were explained to the patient. Questions regarding the procedure were encouraged and answered. The patient understands and consents to the procedure. Survey ultrasound of the right hemithorax was performed, moderate recurrent pleural effusion identified, and an appropriate skin entry site was localized. Site was marked, prepped with chlorhexidine, draped in usual sterile fashion, infiltrated locally with 1% lidocaine. The Saf-T-Centesis needle was advanced into the pleural space. Clear yellow pleural fluid returned. 2.2 L was removed. Post procedure imaging shows no residual fluid. The patient tolerated procedure well. Subsequently, right IJ region prepped and draped in usual sterile fashion. Maximal barrier sterile technique was utilized including caps, mask, sterile gowns, sterile gloves, sterile drape, hand hygiene and skin antiseptic. Under real-time ultrasound guidance, right IJ vein accessed with micropuncture set, exchanged over a Bentson wire for a 7 French 35 cm vascular sheath. Through this, a 5 [place] Kumpe catheter advanced and used to catheterize the right hepatic vein, and TIPS into the native portal venous system. TIPS venogram was obtained. Pressure measurements in the portal vein and at the right atrium level were obtained. Catheter was exchanged over a Rosen wire for an 8 mm x 4 cm Conquest angioplasty balloon, and balloon angioplasty of the TIPS stent and tract was performed. Pressure measurements in the portal vein and at the right atrium level were obtained. Subsequently, catheter exchanged for a 10 mm by 4 cm Conquest angioplasty balloon, and further balloon  angioplasty of the TIPS stent and tract was performed. Pressure measurements in the portal vein and at the right atrium level were obtained. A final TIPS venogram was obtained. Catheter and sheath were removed and hemostasis achieved at the right IJ site with manual compression. Survey ultrasound of the abdomen showed a small amount of scattered abdominal ascites. Paracentesis was therefore deferred. The patient tolerated the procedure well. COMPLICATIONS: None immediate FINDINGS: Moderate right pleural effusion identified. 2.2 L thoracentesis performed. Initial TIPS venogram shows wide patency of the shunt. There is antegrade flow in the main portal vein, and hepatopetal flow in the right and left portal vein branches. No hepatic vein stenosis. Pressure measurements portal vein mean 17 mm Hg, right atrium mean 7 mmHg (gradient 10 mmHg). The TIPS stent and tract were dilated along entire length with the 8 mm balloon with overlapping inflations. Post pressures portal mean 15 mm Hg, right atrium 7 mmHg (gradient 8 mmHg). After further dilatation to 10 mm, post pressures portal mean 16 mm Hg, right atrium 10 mmHg (gradient 6 mmHg). Post TIPS venogram shows excellent flow through the stent. Continued hepatopetal flow in right and left portal branches. No hepatic vein stenosis. IMPRESSION: 1. TIPS remains patent, with 10 mmHg portosystemic gradient, increased from 4 mm gradient reported at the time of deployment. 2. Balloon angioplasty of TIPS stent and tract using 8 and 10 mm balloons with final portosystemic gradient 6 mmHg. 3. 2.2 L pleural fluid removed during right thoracentesis. 4. Small volume residual/recurrent abdominal ascites; paracentesis deferred. Electronically Signed   By: Corlis Leak M.D.   On: 08/16/2023 15:46   IR US Guide Vasc Access Right  Result Date: 08/16/2023 CLINICAL DATA:  Hepatic hydrothorax, status  post TIPS creation 08/09/2023. There is subsequent reaccumulation of right pleural effusion.  EXAM: TIPS REVISION; IR THORACENTESIS ASP PLEURAL SPACE W/IMG GUIDE; ULTRASOUND ABDOMEN LIMITED; IR ULTRASOUND GUIDANCE VASC ACCESS RIGHT ANESTHESIA/SEDATION: Intravenous Fentanyl and Versed 0.5mg  were administered as conscious sedation during continuous monitoring of the patient's level of consciousness and physiological / cardiorespiratory status by the radiology RN, with a total moderate sedation time of 75 minutes. MEDICATIONS: Lidocaine 1% subcutaneous CONTRAST:  30mL OMNIPAQUE IOHEXOL 300 MG/ML  SOLN PROCEDURE: The procedure, risks (including but not limited to bleeding, infection, organ damage ), benefits, and alternatives were explained to the patient. Questions regarding the procedure were encouraged and answered. The patient understands and consents to the procedure. Survey ultrasound of the right hemithorax was performed, moderate recurrent pleural effusion identified, and an appropriate skin entry site was localized. Site was marked, prepped with chlorhexidine, draped in usual sterile fashion, infiltrated locally with 1% lidocaine. The Saf-T-Centesis needle was advanced into the pleural space. Clear yellow pleural fluid returned. 2.2 L was removed. Post procedure imaging shows no residual fluid. The patient tolerated procedure well. Subsequently, right IJ region prepped and draped in usual sterile fashion. Maximal barrier sterile technique was utilized including caps, mask, sterile gowns, sterile gloves, sterile drape, hand hygiene and skin antiseptic. Under real-time ultrasound guidance, right IJ vein accessed with micropuncture set, exchanged over a Bentson wire for a 7 French 35 cm vascular sheath. Through this, a 5 [place] Kumpe catheter advanced and used to catheterize the right hepatic vein, and TIPS into the native portal venous system. TIPS venogram was obtained. Pressure measurements in the portal vein and at the right atrium level were obtained. Catheter was exchanged over a Rosen wire for  an 8 mm x 4 cm Conquest angioplasty balloon, and balloon angioplasty of the TIPS stent and tract was performed. Pressure measurements in the portal vein and at the right atrium level were obtained. Subsequently, catheter exchanged for a 10 mm by 4 cm Conquest angioplasty balloon, and further balloon angioplasty of the TIPS stent and tract was performed. Pressure measurements in the portal vein and at the right atrium level were obtained. A final TIPS venogram was obtained. Catheter and sheath were removed and hemostasis achieved at the right IJ site with manual compression. Survey ultrasound of the abdomen showed a small amount of scattered abdominal ascites. Paracentesis was therefore deferred. The patient tolerated the procedure well. COMPLICATIONS: None immediate FINDINGS: Moderate right pleural effusion identified. 2.2 L thoracentesis performed. Initial TIPS venogram shows wide patency of the shunt. There is antegrade flow in the main portal vein, and hepatopetal flow in the right and left portal vein branches. No hepatic vein stenosis. Pressure measurements portal vein mean 17 mm Hg, right atrium mean 7 mmHg (gradient 10 mmHg). The TIPS stent and tract were dilated along entire length with the 8 mm balloon with overlapping inflations. Post pressures portal mean 15 mm Hg, right atrium 7 mmHg (gradient 8 mmHg). After further dilatation to 10 mm, post pressures portal mean 16 mm Hg, right atrium 10 mmHg (gradient 6 mmHg). Post TIPS venogram shows excellent flow through the stent. Continued hepatopetal flow in right and left portal branches. No hepatic vein stenosis. IMPRESSION: 1. TIPS remains patent, with 10 mmHg portosystemic gradient, increased from 4 mm gradient reported at the time of deployment. 2. Balloon angioplasty of TIPS stent and tract using 8 and 10 mm balloons with final portosystemic gradient 6 mmHg. 3. 2.2 L pleural fluid removed during right thoracentesis. 4.  Small volume residual/recurrent  abdominal ascites; paracentesis deferred. Electronically Signed   By: Corlis Leak M.D.   On: 08/16/2023 15:46   IR ABDOMEN US LIMITED  Result Date: 08/16/2023 CLINICAL DATA:  Hepatic hydrothorax, status post TIPS creation 08/09/2023. There is subsequent reaccumulation of right pleural effusion. EXAM: TIPS REVISION; IR THORACENTESIS ASP PLEURAL SPACE W/IMG GUIDE; ULTRASOUND ABDOMEN LIMITED; IR ULTRASOUND GUIDANCE VASC ACCESS RIGHT ANESTHESIA/SEDATION: Intravenous Fentanyl and Versed 0.5mg  were administered as conscious sedation during continuous monitoring of the patient's level of consciousness and physiological / cardiorespiratory status by the radiology RN, with a total moderate sedation time of 75 minutes. MEDICATIONS: Lidocaine 1% subcutaneous CONTRAST:  30mL OMNIPAQUE IOHEXOL 300 MG/ML  SOLN PROCEDURE: The procedure, risks (including but not limited to bleeding, infection, organ damage ), benefits, and alternatives were explained to the patient. Questions regarding the procedure were encouraged and answered. The patient understands and consents to the procedure. Survey ultrasound of the right hemithorax was performed, moderate recurrent pleural effusion identified, and an appropriate skin entry site was localized. Site was marked, prepped with chlorhexidine, draped in usual sterile fashion, infiltrated locally with 1% lidocaine. The Saf-T-Centesis needle was advanced into the pleural space. Clear yellow pleural fluid returned. 2.2 L was removed. Post procedure imaging shows no residual fluid. The patient tolerated procedure well. Subsequently, right IJ region prepped and draped in usual sterile fashion. Maximal barrier sterile technique was utilized including caps, mask, sterile gowns, sterile gloves, sterile drape, hand hygiene and skin antiseptic. Under real-time ultrasound guidance, right IJ vein accessed with micropuncture set, exchanged over a Bentson wire for a 7 French 35 cm vascular sheath.  Through this, a 5 [place] Kumpe catheter advanced and used to catheterize the right hepatic vein, and TIPS into the native portal venous system. TIPS venogram was obtained. Pressure measurements in the portal vein and at the right atrium level were obtained. Catheter was exchanged over a Rosen wire for an 8 mm x 4 cm Conquest angioplasty balloon, and balloon angioplasty of the TIPS stent and tract was performed. Pressure measurements in the portal vein and at the right atrium level were obtained. Subsequently, catheter exchanged for a 10 mm by 4 cm Conquest angioplasty balloon, and further balloon angioplasty of the TIPS stent and tract was performed. Pressure measurements in the portal vein and at the right atrium level were obtained. A final TIPS venogram was obtained. Catheter and sheath were removed and hemostasis achieved at the right IJ site with manual compression. Survey ultrasound of the abdomen showed a small amount of scattered abdominal ascites. Paracentesis was therefore deferred. The patient tolerated the procedure well. COMPLICATIONS: None immediate FINDINGS: Moderate right pleural effusion identified. 2.2 L thoracentesis performed. Initial TIPS venogram shows wide patency of the shunt. There is antegrade flow in the main portal vein, and hepatopetal flow in the right and left portal vein branches. No hepatic vein stenosis. Pressure measurements portal vein mean 17 mm Hg, right atrium mean 7 mmHg (gradient 10 mmHg). The TIPS stent and tract were dilated along entire length with the 8 mm balloon with overlapping inflations. Post pressures portal mean 15 mm Hg, right atrium 7 mmHg (gradient 8 mmHg). After further dilatation to 10 mm, post pressures portal mean 16 mm Hg, right atrium 10 mmHg (gradient 6 mmHg). Post TIPS venogram shows excellent flow through the stent. Continued hepatopetal flow in right and left portal branches. No hepatic vein stenosis. IMPRESSION: 1. TIPS remains patent, with 10 mmHg  portosystemic gradient, increased from 4  mm gradient reported at the time of deployment. 2. Balloon angioplasty of TIPS stent and tract using 8 and 10 mm balloons with final portosystemic gradient 6 mmHg. 3. 2.2 L pleural fluid removed during right thoracentesis. 4. Small volume residual/recurrent abdominal ascites; paracentesis deferred. Electronically Signed   By: Corlis Leak M.D.   On: 08/16/2023 15:46   DG Chest Port 1 View  Result Date: 08/16/2023 CLINICAL DATA:  Right pleural effusion post thoracentesis EXAM: PORTABLE CHEST - 1 VIEW COMPARISON:  the previous day's study FINDINGS: No pneumothorax. Resolution of right pleural effusion. Improved aeration at the right lung base. Coarse interstitial and alveolar opacities throughout both lungs. Patchy airspace opacities in the left lung base slightly improved. Heart size and mediastinal contours are within normal limits. No effusion. Visualized bones unremarkable. Left upper quadrant embolization coils. IMPRESSION: 1. Resolution of right pleural effusion. No pneumothorax. 2. Persistent coarse interstitial and alveolar opacities throughout both lungs. Electronically Signed   By: Corlis Leak M.D.   On: 08/16/2023 14:37   DG CHEST PORT 1 VIEW  Result Date: 08/15/2023 CLINICAL DATA:  Short of breath, prior abnormal x-ray EXAM: PORTABLE CHEST 1 VIEW COMPARISON:  08/14/2023 FINDINGS: Single frontal view of the chest demonstrates stable enlargement of the cardiac silhouette. Decreased right pleural effusion since prior study. There is stable multifocal bilateral airspace disease, greatest at the lung bases. No pneumothorax. No acute bony abnormalities. Embolic coils over the central upper abdomen unchanged. IMPRESSION: 1. Multifocal bilateral airspace disease consistent with edema or infection. 2. Decreased right pleural effusion. Electronically Signed   By: Sharlet Salina M.D.   On: 08/15/2023 18:45    Lab Results: Recent Labs    08/15/23 0319 08/16/23 1230   WBC 3.3* 3.1*  HGB 8.2* 8.2*  HCT 25.4* 25.4*  PLT 63* 65*   BMET Recent Labs    08/15/23 0319 08/16/23 1230  NA 139 140  K 4.0 3.6  CL 106 105  CO2 26 30  GLUCOSE 165* 96  BUN 28* 21  CREATININE 1.04* 0.94  CALCIUM 8.0* 8.1*   LFT Recent Labs    08/16/23 1230  PROT 5.3*  ALBUMIN 2.6*  AST 30  ALT 17  ALKPHOS 70  BILITOT 1.0   PT/INR Recent Labs    08/16/23 1230  LABPROT 21.5*  INR 1.8*     Scheduled Meds:  Chlorhexidine Gluconate Cloth  6 each Topical Daily   diclofenac Sodium  4 g Topical QID   enoxaparin (LOVENOX) injection  40 mg Subcutaneous Q24H   folic acid  1 mg Oral BID   Gerhardt's butt cream   Topical BID   insulin aspart  0-15 Units Subcutaneous TID WC   insulin aspart  0-5 Units Subcutaneous QHS   insulin glargine-yfgn  20 Units Subcutaneous QHS   lactulose  20 g Oral TID   midodrine  5 mg Oral TID WC   mouth rinse  15 mL Mouth Rinse 4 times per day   pantoprazole  40 mg Oral Daily   pravastatin  20 mg Oral QHS   rifaximin  550 mg Oral BID   sertraline  150 mg Oral Daily   spironolactone  25 mg Oral Daily   Continuous Infusions:  sodium chloride        Patient profile:   67 year old female with 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 Persistent hepatic hydrothorax. transferred for TIPS revision.   Impression:   Decompensated MASH cirrhosis S/P balloon angioplasty/revision of existing TIPS 8/29 Wbc 3.1, hgb 8.2, platelets 65 Normal LFTs PT/INR 21.5, INR 1.8 MELD 3.0: 14 MELD-Na: 13 Stable from clinical standpoint. Palliative care following. Patient with plans to follow up with IR in one month outpatient.  Worsening chest x-ray.  Pleural effusion CXR 8/29: worsening airspace disease and effusion compared to previous studies. asymmetric  edema/infection, moderate right pleural effusion.  CKD  Type 2 DM   Plan:   -Continue lactulose/rifaximin - increase Lasix 40mg  IV BID with intermittent IV albumin (with morning dose) to promote diuresis. Continue spironolactone. If no improvement in 48hrs consider Bumex. -- repeat CXR in the morning -- Continue midodrine as needed for BP support with advancing diuretics  -- GI will follow  Bayley Leanna Sato  08/17/2023, 11:11 AM

## 2023-08-17 NOTE — Progress Notes (Signed)
Referring Provider(s): Bradley,Candace  Supervising Physician: Roanna Banning  Patient Status:  [facility] - In-pt  Chief Complaint:  F/U After TIPS revision  Brief History:  Ariel Arnold is a 67 y.o. female inpatient. History of DM, anemia, osteomyelitis s/p Left BKA, decompensated NASH Cirrhosis with portal hypertension, GI bleed, gastric varices, esophageal varices, hepatic encephalopathy and hepatic hydrothorax.   MELD was 15 and a Child Pugh Class B.   On 08/09/23 she underwent a TIPS with a *=2 GORE Viatorr stent placement a paracentesis and a right sided thoracentesis by Dr. Milford Cage.  Unfortunately she had persistent hepatic hydrothorax.   Yesterday she underwent balloon angioplasty of the TIPS yesterday by Dr. Deanne Coffer.   Subjective:  Sitting up in the chair, eating lunch. Expresses concern about telling her family that her liver is failing.   Allergies: Tape  Medications: Prior to Admission medications   Medication Sig Start Date End Date Taking? Authorizing Provider  albuterol (VENTOLIN HFA) 108 (90 Base) MCG/ACT inhaler Inhale 2 puffs into the lungs 2 (two) times daily as needed for shortness of breath or wheezing. 01/11/21   [provider]  Calcium Carbonate-Vit D-Min (CALCIUM 1200) 1200-1000 MG-UNIT CHEW Chew 2 tablets by mouth daily.    [provider]  carvedilol (COREG) 6.25 MG tablet Take 1 tablet (6.25 mg total) by mouth 2 (two) times daily. 04/11/22 08/21/23  Jerald Kief, MD  clotrimazole-betamethasone (LOTRISONE) cream Apply 1 Application topically daily as needed (for yeast/rash). 11/25/21   [provider]  diclofenac Sodium (VOLTAREN) 1 % GEL Apply 2 g topically in the morning and at bedtime. 12/02/21   [provider]  ferrous sulfate 325 (65 FE) MG tablet Take 325 mg by mouth in the morning and at bedtime.    [provider]  folic acid (FOLVITE) 1 MG tablet Take 1 mg by mouth 2 (two) times daily. 06/02/21    [provider]  furosemide (LASIX) 20 MG tablet Take 20 mg by mouth once a week. 05/27/21   [provider]  gabapentin (NEURONTIN) 600 MG tablet Take 600 mg by mouth 3 (three) times daily as needed (nerve pain). 07/23/21   [provider]  hydrOXYzine (ATARAX) 25 MG tablet Take 25 mg by mouth daily. 08/17/22   [provider]  insulin aspart (NOVOLOG) 100 UNIT/ML injection 0-20 Units, Subcutaneous, 3 times daily with meal CBG < 70: Implement Hypoglycemia measures CBG 70 - 120: 0 units CBG 121 - 150: 3 units CBG 151 - 200: 4 units CBG 201 - 250: 7 units CBG 251 - 300: 11 units CBG 301 - 350: 15 units CBG 351 - 400: 20 units CBG > 400: call MD 08/31/22   Maretta Bees, MD  isosorbide mononitrate (IMDUR) 30 MG 24 hr tablet Take by mouth. 08/07/23 08/06/24  [provider]  ketorolac (ACULAR) 0.5 % ophthalmic solution Place 1 drop into both eyes 4 (four) times daily as needed (dry eyes). 09/06/21   [provider]  lactulose (CHRONULAC) 10 GM/15ML solution Take 15 mLs (10 g total) by mouth 2 (two) times daily. for ammonia levels 08/31/22   Ghimire, Werner Lean, MD  oxyCODONE (OXY IR/ROXICODONE) 5 MG immediate release tablet Take 1 tablet (5 mg total) by mouth every 6 (six) hours as needed for moderate pain (pain score 4-6). 09/13/22   Adonis Huguenin, NP  pantoprazole (PROTONIX) 40 MG tablet Take 40 mg by mouth daily as needed (heartburn). 05/27/21   [provider]  pravastatin (PRAVACHOL) 20 MG tablet Take 1 tablet (20 mg total) by mouth at bedtime. Take after completion of daptomycin 01/03/22   [name], Rebekah Chesterfield, MD  rifaximin (XIFAXAN) 550 MG TABS tablet Take 1 tablet (550 mg total) by mouth 2 (two) times daily. 08/31/22   Ghimire, Werner Lean, MD  sertraline (ZOLOFT) 100 MG tablet Take 2 tablets (200 mg total) by mouth daily. Patient taking differently: Take 150 mg by mouth daily. 06/08/22 08/21/23  Erick Blinks, MD  sodium hypochlorite (DAKIN'S 1/2  STRENGTH) external solution Irrigate with 1 Application as directed daily. 03/08/23   Candelaria Stagers, DPM  spironolactone (ALDACTONE) 50 MG tablet Take 50 mg by mouth daily as needed (swelling). 05/27/21   [provider]  TOUJEO MAX SOLOSTAR 300 UNIT/ML Solostar Pen Inject 20 Units into the skin at bedtime. 08/31/22   Ghimire, Werner Lean, MD  vitamin C (ASCORBIC ACID) 500 MG tablet Take 500 mg by mouth 2 (two) times daily.    [provider]  Vitamin D, Ergocalciferol, (DRISDOL) 1.25 MG (50000 UNIT) CAPS capsule Take 1 capsule by mouth every 7 (seven) days. Mondays 04/05/21   [provider]     Vital Signs: BP (!) 100/51 (BP Location: Left Arm)   Pulse 88   Temp 98.5 F (36.9 C) (Oral)   Resp 16   Ht 5\' 8"  (1.727 m)   Wt 175 lb 7.8 oz (79.6 kg)   SpO2 98%   BMI 26.68 kg/m   Physical Exam Vitals reviewed.  Constitutional:      Appearance: Normal appearance.  Neck:     Comments: Right internal jugular stick site looks good. No hematoma. Cardiovascular:     Rate and Rhythm: Normal rate.  Pulmonary:     Effort: Pulmonary effort is normal. No respiratory distress.  Neurological:     General: No focal deficit present.     Mental Status: She is alert and oriented to person, place, and time.  Psychiatric:        Mood and Affect: Mood normal.        Behavior: Behavior normal.        Thought Content: Thought content normal.        Judgment: Judgment normal.   No encephalopathy    Labs:  CBC: Recent Labs    08/13/23 0411 08/14/23 0416 08/15/23 0319 08/16/23 1230  WBC 5.5 3.2* 3.3* 3.1*  HGB 8.9* 7.4* 8.2* 8.2*  HCT 27.6* 22.7* 25.4* 25.4*  PLT 87* 60* 63* 65*    COAGS: Recent Labs    08/08/23 0309 08/09/23 0111 08/16/23 1230  INR 1.5* 1.5* 1.8*    BMP: Recent Labs    08/13/23 0411 08/14/23 0416 08/15/23 0319 08/16/23 1230  NA 139 139 139 140  K 4.1 3.4* 4.0 3.6  CL 107 106 106 105  CO2 24 28 26 30   GLUCOSE 162* 138* 165* 96   BUN 33* 35* 28* 21  CALCIUM 8.2* 8.1* 8.0* 8.1*  CREATININE 1.25* 1.17* 1.04* 0.94  GFRNONAA 47* 51* 58* >60    LIVER FUNCTION TESTS: Recent Labs    08/09/23 0111 08/10/23 0359 08/14/23 0416 08/16/23 1230  BILITOT 1.1 2.7* 1.9* 1.0  AST 18 23 28 30   ALT 15 15 15 17   ALKPHOS 75 60 56 70  PROT 5.8* 4.8* 4.9* 5.3*  ALBUMIN 2.1* 1.9* 2.4* 2.6*    Assessment and Plan:  Decompensated NASH Cirrhosis with portal hypertension and recurrent right sided hydrothorax.  S/P balloon angioplasty/revision of existing  TIPS.  Stable and doing well from IR standpoint.   Will arrange routine follow up for TIPS which includes US of the TIPS to ensure patency. This will be in about one month. IR schedulers will call patient with appointment details.  Electronically Signed: Gwynneth Macleod, PA-C 08/17/2023, 1:58 PM    I spent a total of 15 Minutes at the the patient's bedside AND on the patient's hospital floor or unit, greater than 50% of which was counseling/coordinating care for TIPS revision f/u.

## 2023-08-17 NOTE — Progress Notes (Signed)
Patient ID: ALINA FLASH, female   DOB: 1956-09-09, 67 y.o.   MRN: [id number]    Progress Note from the Palliative Medicine Team at [facility]   Patient Name: ASTA STEVENSON        Date: 08/17/2023 DOB: 12-Apr-1956  Age: 67 y.o. MRN#: [id number] Attending Physician: Leatha Gilding, MD Primary Care Physician: Joaquin Courts, DO Admit Date: 08/07/2023   Reason for Consultation/Follow-up   Establishing Goals of  Care   HPI/ Brief Hospital Review  67 y.o. female  with past medical history of NASH cirrhosis with ascites, insulin-dependent diabetes mellitus, depression, anxiety, and right heel ulcer who presented to [facility] on 08/01/2023 with shortness of breath.  Upon arrival to the ED, patient was found to have large right pleural effusion and a new supplemental oxygen requirement. She was admitted to the hospitalist service and underwent therapeutic thoracenteses on 08/02/2023 and again on 08/04/2023.  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  Dr. Archer Asa of IR agreed to see the patient in consultation for consideration of TIPS.  She was [facility] then [facility] under the hospitalist service for IR evaluation.    08/09/23 she underwent successful ICE-guided TIPS and right thoracentesis and paracentesis; however, had post op hypotension requiring pressor support and BiPap application.  8/22 she was weaned off pressors and back on RA.  8/23 with increased shortness of breath and confusion. Increased lactulose and diuresis 8/24 Mental status improving 8-25 increasing pulmonary edema 08-16-23  repeat thoracentesis for 2L fluid   Subjective  Extensive chart review has been completed prior to meeting with patient/family  including labs, vital signs, imaging, progress/consult notes, orders, medications and available advance directive documents.    This NP assessed patient at the bedside as a follow up for palliative medicine needs  and emotional support, no family at bedside.         Continued  education offered on current medical situation specific to her liver disease, DM, vascular disease and her overall failure to thrive.    I shared with Ms. Fuda my concern that she will continue to have fluid buildup in her lungs requiring thoracentesis.  Her oxygen needs have increased again.    Continued attempts at medical management with diuretics and albumin replacement.     She ultimately wants to be at home, family communicates they will do whatever they can but worry it won't be enough   Education offered and we revisited  hospice benefit; philosophy and eligibility. We discussed the possibility of a pluex-cath on discharge, hoping to increase quality of life at home.  Patient today again is considering hospice.  She lives in [place], I believe that is [facility]       Education offered today regarding  the importance of continued conversation with family and their  medical providers regarding overall plan of care and treatment options,  ensuring decisions are within the context of the patients values and GOCs.   Again  encouraged documentation of HPOA and ACP wishes.   Will be important for patient to establish clear plan of care prior to discharge, she is high risk for decompensation  Questions and concerns addressed   Discussed with primary team and nursing staff   Time:    50  minutes  Detailed review of medical records ( labs, imaging, vital signs), medically appropriate exam ( MS, skin, cardia,  resp)   discussed with treatment team, counseling and education to  patient, family, staff, documenting clinical information, medication management, coordination of care    Lorinda Creed NP  Palliative Medicine Team Team Phone # [phone] Pager [phone]

## 2023-08-18 ENCOUNTER — Inpatient Hospital Stay (HOSPITAL_COMMUNITY): Payer: Medicare HMO

## 2023-08-18 DIAGNOSIS — Z7189 Other specified counseling: Secondary | ICD-10-CM | POA: Diagnosis not present

## 2023-08-18 DIAGNOSIS — K7460 Unspecified cirrhosis of liver: Secondary | ICD-10-CM | POA: Diagnosis not present

## 2023-08-18 DIAGNOSIS — J90 Pleural effusion, not elsewhere classified: Secondary | ICD-10-CM | POA: Diagnosis not present

## 2023-08-18 DIAGNOSIS — J810 Acute pulmonary edema: Secondary | ICD-10-CM | POA: Diagnosis not present

## 2023-08-18 DIAGNOSIS — K7290 Hepatic failure, unspecified without coma: Secondary | ICD-10-CM | POA: Diagnosis not present

## 2023-08-18 LAB — CBC
HCT: 23.5 % — ABNORMAL LOW (ref 36.0–46.0)
Hemoglobin: 7.8 g/dL — ABNORMAL LOW (ref 12.0–15.0)
MCH: 32.4 pg (ref 26.0–34.0)
MCHC: 33.2 g/dL (ref 30.0–36.0)
MCV: 97.5 fL (ref 80.0–100.0)
Platelets: 60 10*3/uL — ABNORMAL LOW (ref 150–400)
RBC: 2.41 MIL/uL — ABNORMAL LOW (ref 3.87–5.11)
RDW: 15.4 % (ref 11.5–15.5)
WBC: 2.6 10*3/uL — ABNORMAL LOW (ref 4.0–10.5)
nRBC: 0 % (ref 0.0–0.2)

## 2023-08-18 LAB — COMPREHENSIVE METABOLIC PANEL
ALT: 17 U/L (ref 0–44)
AST: 30 U/L (ref 15–41)
Albumin: 2.9 g/dL — ABNORMAL LOW (ref 3.5–5.0)
Alkaline Phosphatase: 71 U/L (ref 38–126)
Anion gap: 8 (ref 5–15)
BUN: 17 mg/dL (ref 8–23)
CO2: 30 mmol/L (ref 22–32)
Calcium: 8.3 mg/dL — ABNORMAL LOW (ref 8.9–10.3)
Chloride: 98 mmol/L (ref 98–111)
Creatinine, Ser: 1.03 mg/dL — ABNORMAL HIGH (ref 0.44–1.00)
GFR, Estimated: 59 mL/min — ABNORMAL LOW (ref >60)
Glucose, Bld: 191 mg/dL — ABNORMAL HIGH (ref 70–99)
Potassium: 3.7 mmol/L (ref 3.5–5.1)
Sodium: 136 mmol/L (ref 135–145)
Total Bilirubin: 1.9 mg/dL — ABNORMAL HIGH (ref 0.3–1.2)
Total Protein: 5.4 g/dL — ABNORMAL LOW (ref 6.5–8.1)

## 2023-08-18 LAB — MAGNESIUM: Magnesium: 1.5 mg/dL — ABNORMAL LOW (ref 1.7–2.4)

## 2023-08-18 LAB — GLUCOSE, CAPILLARY
Glucose-Capillary: 157 mg/dL — ABNORMAL HIGH (ref 70–99)
Glucose-Capillary: 141 mg/dL — ABNORMAL HIGH (ref 70–99)
Glucose-Capillary: 172 mg/dL — ABNORMAL HIGH (ref 70–99)
Glucose-Capillary: 226 mg/dL — ABNORMAL HIGH (ref 70–99)
Glucose-Capillary: 208 mg/dL — ABNORMAL HIGH (ref 70–99)
Glucose-Capillary: 109 mg/dL — ABNORMAL HIGH (ref 70–99)
Glucose-Capillary: 206 mg/dL — ABNORMAL HIGH (ref 70–99)

## 2023-08-18 MED ORDER — POTASSIUM CHLORIDE CRYS ER 20 MEQ PO TBCR
30.0000 meq | EXTENDED_RELEASE_TABLET | Freq: Once | ORAL | Status: AC
  Administered 2023-08-18: 30 meq via ORAL
  Filled 2023-08-18: qty 1

## 2023-08-18 MED ORDER — MAGNESIUM SULFATE 2 GM/50ML IV SOLN
2.0000 g | Freq: Once | INTRAVENOUS | Status: AC
  Administered 2023-08-18: 2 g via INTRAVENOUS
  Filled 2023-08-18: qty 50

## 2023-08-18 NOTE — Progress Notes (Addendum)
Attending physician's note   I have taken a history, reviewed the chart, and examined the patient. I performed a substantive portion of this encounter, including complete performance of at least one of the key components, in conjunction with the APP. I agree with the APP's note, impression, and recommendations with my edits.   Inpatient GI service will remain available as needed.  Ariel Leyland, DO, FACG [phone] office          Progress Note   LOS: 11 days   Chief Complaint: MASH cirrhosis s/p TIPS revision    Subjective    Patient reports she is not feeling well today.  Has some mild abdominal pain.  Respiratory status is same compared to yesterday.  Tolerating diet without difficulty.   Objective   Vital signs in last 24 hours: Temp:  [97.8 F (36.6 C)-98.8 F (37.1 C)] 97.8 F (36.6 C) (08/30 0819) Pulse Rate:  [86-91] 91 (08/30 0819) Resp:  [16-20] 20 (08/30 0819) BP: (102-109)/(43-91) 102/53 (08/30 0819) SpO2:  [94 %-100 %] 95 % (08/30 0819) Last BM Date : 08/15/23 Last BM recorded by nurses in past 5 days Stool Type: Type 6 (Mushy consistency with ragged edges) (08/18/2023  5:30 AM)  General:   female in no acute distress Heart:  Regular rate and rhythm; no murmurs Pulm: Clear anteriorly; no wheezing Abdomen: soft, nondistended, normal bowel sounds in all quadrants. Nontender without guarding. No organomegaly appreciated. Extremities:  No edema Neurologic:  Alert and  oriented x4;  No focal deficits.  Psych:  Cooperative. Normal mood and affect.  Intake/Output from previous day: 08/29 0701 - 08/30 0700 In: 381 [IV Piggyback:381] Out: 1750 [Urine:1750] Intake/Output this shift: Total I/O In: -  Out: 2000 [Urine:2000]  Studies/Results: DG Chest 2 View  Result Date: 08/18/2023 CLINICAL DATA:  Pleural effusion EXAM: CHEST - 2 VIEW COMPARISON:  08/17/2023 FINDINGS: Unchanged AP portable chest radiograph. Cardiomegaly. Diffuse bilateral  interstitial pulmonary opacity. Small right pleural effusion. The visualized skeletal structures are unremarkable. IMPRESSION: Cardiomegaly with diffuse bilateral interstitial pulmonary opacity, consistent with edema or infection. No new airspace opacity. Small right pleural effusion. Electronically Signed   By: Jearld Lesch M.D.   On: 08/18/2023 08:24   DG CHEST PORT 1 VIEW  Result Date: 08/17/2023 CLINICAL DATA:  09811 Pleural effusion associated with hepatic disorder 91478 EXAM: PORTABLE CHEST 1 VIEW COMPARISON:  08/16/2023 FINDINGS: Heart and mediastinal contours within normal limits. Aortic atherosclerosis. Diffuse interstitial and airspace opacities, right greater than left. Moderate layering right pleural effusion. Airspace disease and effusion have worsened since prior study. IMPRESSION: Worsening interstitial and airspace opacities, right greater than left could reflect asymmetric edema or infection. Moderate right pleural effusion. Electronically Signed   By: Charlett Nose M.D.   On: 08/17/2023 10:28   IR TIPS REVISION MOD SED  Result Date: 08/16/2023 CLINICAL DATA:  Hepatic hydrothorax, status post TIPS creation 08/09/2023. There is subsequent reaccumulation of right pleural effusion. EXAM: TIPS REVISION; IR THORACENTESIS ASP PLEURAL SPACE W/IMG GUIDE; ULTRASOUND ABDOMEN LIMITED; IR ULTRASOUND GUIDANCE VASC ACCESS RIGHT ANESTHESIA/SEDATION: Intravenous Fentanyl and Versed 0.5mg  were administered as conscious sedation during continuous monitoring of the patient's level of consciousness and physiological / cardiorespiratory status by the radiology RN, with a total moderate sedation time of 75 minutes. MEDICATIONS: Lidocaine 1% subcutaneous CONTRAST:  30mL OMNIPAQUE IOHEXOL 300 MG/ML  SOLN PROCEDURE: The procedure, risks (including but not limited to bleeding, infection, organ damage ), benefits, and alternatives were explained to the  patient. Questions regarding the procedure were encouraged  and answered. The patient understands and consents to the procedure. Survey ultrasound of the right hemithorax was performed, moderate recurrent pleural effusion identified, and an appropriate skin entry site was localized. Site was marked, prepped with chlorhexidine, draped in usual sterile fashion, infiltrated locally with 1% lidocaine. The Saf-T-Centesis needle was advanced into the pleural space. Clear yellow pleural fluid returned. 2.2 L was removed. Post procedure imaging shows no residual fluid. The patient tolerated procedure well. Subsequently, right IJ region prepped and draped in usual sterile fashion. Maximal barrier sterile technique was utilized including caps, mask, sterile gowns, sterile gloves, sterile drape, hand hygiene and skin antiseptic. Under real-time ultrasound guidance, right IJ vein accessed with micropuncture set, exchanged over a Bentson wire for a 7 French 35 cm vascular sheath. Through this, a 5 [place] Kumpe catheter advanced and used to catheterize the right hepatic vein, and TIPS into the native portal venous system. TIPS venogram was obtained. Pressure measurements in the portal vein and at the right atrium level were obtained. Catheter was exchanged over a Rosen wire for an 8 mm x 4 cm Conquest angioplasty balloon, and balloon angioplasty of the TIPS stent and tract was performed. Pressure measurements in the portal vein and at the right atrium level were obtained. Subsequently, catheter exchanged for a 10 mm by 4 cm Conquest angioplasty balloon, and further balloon angioplasty of the TIPS stent and tract was performed. Pressure measurements in the portal vein and at the right atrium level were obtained. A final TIPS venogram was obtained. Catheter and sheath were removed and hemostasis achieved at the right IJ site with manual compression. Survey ultrasound of the abdomen showed a small amount of scattered abdominal ascites. Paracentesis was therefore deferred. The patient  tolerated the procedure well. COMPLICATIONS: None immediate FINDINGS: Moderate right pleural effusion identified. 2.2 L thoracentesis performed. Initial TIPS venogram shows wide patency of the shunt. There is antegrade flow in the main portal vein, and hepatopetal flow in the right and left portal vein branches. No hepatic vein stenosis. Pressure measurements portal vein mean 17 mm Hg, right atrium mean 7 mmHg (gradient 10 mmHg). The TIPS stent and tract were dilated along entire length with the 8 mm balloon with overlapping inflations. Post pressures portal mean 15 mm Hg, right atrium 7 mmHg (gradient 8 mmHg). After further dilatation to 10 mm, post pressures portal mean 16 mm Hg, right atrium 10 mmHg (gradient 6 mmHg). Post TIPS venogram shows excellent flow through the stent. Continued hepatopetal flow in right and left portal branches. No hepatic vein stenosis. IMPRESSION: 1. TIPS remains patent, with 10 mmHg portosystemic gradient, increased from 4 mm gradient reported at the time of deployment. 2. Balloon angioplasty of TIPS stent and tract using 8 and 10 mm balloons with final portosystemic gradient 6 mmHg. 3. 2.2 L pleural fluid removed during right thoracentesis. 4. Small volume residual/recurrent abdominal ascites; paracentesis deferred. Electronically Signed   By: Corlis Leak M.D.   On: 08/16/2023 15:46   IR THORACENTESIS ASP PLEURAL SPACE W/IMG GUIDE  Result Date: 08/16/2023 CLINICAL DATA:  Hepatic hydrothorax, status post TIPS creation 08/09/2023. There is subsequent reaccumulation of right pleural effusion. EXAM: TIPS REVISION; IR THORACENTESIS ASP PLEURAL SPACE W/IMG GUIDE; ULTRASOUND ABDOMEN LIMITED; IR ULTRASOUND GUIDANCE VASC ACCESS RIGHT ANESTHESIA/SEDATION: Intravenous Fentanyl and Versed 0.5mg  were administered as conscious sedation during continuous monitoring of the patient's level of consciousness and physiological / cardiorespiratory status by the radiology RN, with a total  moderate  sedation time of 75 minutes. MEDICATIONS: Lidocaine 1% subcutaneous CONTRAST:  30mL OMNIPAQUE IOHEXOL 300 MG/ML  SOLN PROCEDURE: The procedure, risks (including but not limited to bleeding, infection, organ damage ), benefits, and alternatives were explained to the patient. Questions regarding the procedure were encouraged and answered. The patient understands and consents to the procedure. Survey ultrasound of the right hemithorax was performed, moderate recurrent pleural effusion identified, and an appropriate skin entry site was localized. Site was marked, prepped with chlorhexidine, draped in usual sterile fashion, infiltrated locally with 1% lidocaine. The Saf-T-Centesis needle was advanced into the pleural space. Clear yellow pleural fluid returned. 2.2 L was removed. Post procedure imaging shows no residual fluid. The patient tolerated procedure well. Subsequently, right IJ region prepped and draped in usual sterile fashion. Maximal barrier sterile technique was utilized including caps, mask, sterile gowns, sterile gloves, sterile drape, hand hygiene and skin antiseptic. Under real-time ultrasound guidance, right IJ vein accessed with micropuncture set, exchanged over a Bentson wire for a 7 French 35 cm vascular sheath. Through this, a 5 [place] Kumpe catheter advanced and used to catheterize the right hepatic vein, and TIPS into the native portal venous system. TIPS venogram was obtained. Pressure measurements in the portal vein and at the right atrium level were obtained. Catheter was exchanged over a Rosen wire for an 8 mm x 4 cm Conquest angioplasty balloon, and balloon angioplasty of the TIPS stent and tract was performed. Pressure measurements in the portal vein and at the right atrium level were obtained. Subsequently, catheter exchanged for a 10 mm by 4 cm Conquest angioplasty balloon, and further balloon angioplasty of the TIPS stent and tract was performed. Pressure measurements in the portal vein  and at the right atrium level were obtained. A final TIPS venogram was obtained. Catheter and sheath were removed and hemostasis achieved at the right IJ site with manual compression. Survey ultrasound of the abdomen showed a small amount of scattered abdominal ascites. Paracentesis was therefore deferred. The patient tolerated the procedure well. COMPLICATIONS: None immediate FINDINGS: Moderate right pleural effusion identified. 2.2 L thoracentesis performed. Initial TIPS venogram shows wide patency of the shunt. There is antegrade flow in the main portal vein, and hepatopetal flow in the right and left portal vein branches. No hepatic vein stenosis. Pressure measurements portal vein mean 17 mm Hg, right atrium mean 7 mmHg (gradient 10 mmHg). The TIPS stent and tract were dilated along entire length with the 8 mm balloon with overlapping inflations. Post pressures portal mean 15 mm Hg, right atrium 7 mmHg (gradient 8 mmHg). After further dilatation to 10 mm, post pressures portal mean 16 mm Hg, right atrium 10 mmHg (gradient 6 mmHg). Post TIPS venogram shows excellent flow through the stent. Continued hepatopetal flow in right and left portal branches. No hepatic vein stenosis. IMPRESSION: 1. TIPS remains patent, with 10 mmHg portosystemic gradient, increased from 4 mm gradient reported at the time of deployment. 2. Balloon angioplasty of TIPS stent and tract using 8 and 10 mm balloons with final portosystemic gradient 6 mmHg. 3. 2.2 L pleural fluid removed during right thoracentesis. 4. Small volume residual/recurrent abdominal ascites; paracentesis deferred. Electronically Signed   By: Corlis Leak M.D.   On: 08/16/2023 15:46   IR US Guide Vasc Access Right  Result Date: 08/16/2023 CLINICAL DATA:  Hepatic hydrothorax, status post TIPS creation 08/09/2023. There is subsequent reaccumulation of right pleural effusion. EXAM: TIPS REVISION; IR THORACENTESIS ASP PLEURAL SPACE W/IMG GUIDE; ULTRASOUND ABDOMEN LIMITED;  IR ULTRASOUND GUIDANCE VASC ACCESS RIGHT ANESTHESIA/SEDATION: Intravenous Fentanyl and Versed 0.5mg  were administered as conscious sedation during continuous monitoring of the patient's level of consciousness and physiological / cardiorespiratory status by the radiology RN, with a total moderate sedation time of 75 minutes. MEDICATIONS: Lidocaine 1% subcutaneous CONTRAST:  30mL OMNIPAQUE IOHEXOL 300 MG/ML  SOLN PROCEDURE: The procedure, risks (including but not limited to bleeding, infection, organ damage ), benefits, and alternatives were explained to the patient. Questions regarding the procedure were encouraged and answered. The patient understands and consents to the procedure. Survey ultrasound of the right hemithorax was performed, moderate recurrent pleural effusion identified, and an appropriate skin entry site was localized. Site was marked, prepped with chlorhexidine, draped in usual sterile fashion, infiltrated locally with 1% lidocaine. The Saf-T-Centesis needle was advanced into the pleural space. Clear yellow pleural fluid returned. 2.2 L was removed. Post procedure imaging shows no residual fluid. The patient tolerated procedure well. Subsequently, right IJ region prepped and draped in usual sterile fashion. Maximal barrier sterile technique was utilized including caps, mask, sterile gowns, sterile gloves, sterile drape, hand hygiene and skin antiseptic. Under real-time ultrasound guidance, right IJ vein accessed with micropuncture set, exchanged over a Bentson wire for a 7 French 35 cm vascular sheath. Through this, a 5 [place] Kumpe catheter advanced and used to catheterize the right hepatic vein, and TIPS into the native portal venous system. TIPS venogram was obtained. Pressure measurements in the portal vein and at the right atrium level were obtained. Catheter was exchanged over a Rosen wire for an 8 mm x 4 cm Conquest angioplasty balloon, and balloon angioplasty of the TIPS stent and tract  was performed. Pressure measurements in the portal vein and at the right atrium level were obtained. Subsequently, catheter exchanged for a 10 mm by 4 cm Conquest angioplasty balloon, and further balloon angioplasty of the TIPS stent and tract was performed. Pressure measurements in the portal vein and at the right atrium level were obtained. A final TIPS venogram was obtained. Catheter and sheath were removed and hemostasis achieved at the right IJ site with manual compression. Survey ultrasound of the abdomen showed a small amount of scattered abdominal ascites. Paracentesis was therefore deferred. The patient tolerated the procedure well. COMPLICATIONS: None immediate FINDINGS: Moderate right pleural effusion identified. 2.2 L thoracentesis performed. Initial TIPS venogram shows wide patency of the shunt. There is antegrade flow in the main portal vein, and hepatopetal flow in the right and left portal vein branches. No hepatic vein stenosis. Pressure measurements portal vein mean 17 mm Hg, right atrium mean 7 mmHg (gradient 10 mmHg). The TIPS stent and tract were dilated along entire length with the 8 mm balloon with overlapping inflations. Post pressures portal mean 15 mm Hg, right atrium 7 mmHg (gradient 8 mmHg). After further dilatation to 10 mm, post pressures portal mean 16 mm Hg, right atrium 10 mmHg (gradient 6 mmHg). Post TIPS venogram shows excellent flow through the stent. Continued hepatopetal flow in right and left portal branches. No hepatic vein stenosis. IMPRESSION: 1. TIPS remains patent, with 10 mmHg portosystemic gradient, increased from 4 mm gradient reported at the time of deployment. 2. Balloon angioplasty of TIPS stent and tract using 8 and 10 mm balloons with final portosystemic gradient 6 mmHg. 3. 2.2 L pleural fluid removed during right thoracentesis. 4. Small volume residual/recurrent abdominal ascites; paracentesis deferred. Electronically Signed   By: Corlis Leak M.D.   On: 08/16/2023  15:46   IR ABDOMEN  US LIMITED  Result Date: 08/16/2023 CLINICAL DATA:  Hepatic hydrothorax, status post TIPS creation 08/09/2023. There is subsequent reaccumulation of right pleural effusion. EXAM: TIPS REVISION; IR THORACENTESIS ASP PLEURAL SPACE W/IMG GUIDE; ULTRASOUND ABDOMEN LIMITED; IR ULTRASOUND GUIDANCE VASC ACCESS RIGHT ANESTHESIA/SEDATION: Intravenous Fentanyl and Versed 0.5mg  were administered as conscious sedation during continuous monitoring of the patient's level of consciousness and physiological / cardiorespiratory status by the radiology RN, with a total moderate sedation time of 75 minutes. MEDICATIONS: Lidocaine 1% subcutaneous CONTRAST:  30mL OMNIPAQUE IOHEXOL 300 MG/ML  SOLN PROCEDURE: The procedure, risks (including but not limited to bleeding, infection, organ damage ), benefits, and alternatives were explained to the patient. Questions regarding the procedure were encouraged and answered. The patient understands and consents to the procedure. Survey ultrasound of the right hemithorax was performed, moderate recurrent pleural effusion identified, and an appropriate skin entry site was localized. Site was marked, prepped with chlorhexidine, draped in usual sterile fashion, infiltrated locally with 1% lidocaine. The Saf-T-Centesis needle was advanced into the pleural space. Clear yellow pleural fluid returned. 2.2 L was removed. Post procedure imaging shows no residual fluid. The patient tolerated procedure well. Subsequently, right IJ region prepped and draped in usual sterile fashion. Maximal barrier sterile technique was utilized including caps, mask, sterile gowns, sterile gloves, sterile drape, hand hygiene and skin antiseptic. Under real-time ultrasound guidance, right IJ vein accessed with micropuncture set, exchanged over a Bentson wire for a 7 French 35 cm vascular sheath. Through this, a 5 [place] Kumpe catheter advanced and used to catheterize the right hepatic vein, and TIPS  into the native portal venous system. TIPS venogram was obtained. Pressure measurements in the portal vein and at the right atrium level were obtained. Catheter was exchanged over a Rosen wire for an 8 mm x 4 cm Conquest angioplasty balloon, and balloon angioplasty of the TIPS stent and tract was performed. Pressure measurements in the portal vein and at the right atrium level were obtained. Subsequently, catheter exchanged for a 10 mm by 4 cm Conquest angioplasty balloon, and further balloon angioplasty of the TIPS stent and tract was performed. Pressure measurements in the portal vein and at the right atrium level were obtained. A final TIPS venogram was obtained. Catheter and sheath were removed and hemostasis achieved at the right IJ site with manual compression. Survey ultrasound of the abdomen showed a small amount of scattered abdominal ascites. Paracentesis was therefore deferred. The patient tolerated the procedure well. COMPLICATIONS: None immediate FINDINGS: Moderate right pleural effusion identified. 2.2 L thoracentesis performed. Initial TIPS venogram shows wide patency of the shunt. There is antegrade flow in the main portal vein, and hepatopetal flow in the right and left portal vein branches. No hepatic vein stenosis. Pressure measurements portal vein mean 17 mm Hg, right atrium mean 7 mmHg (gradient 10 mmHg). The TIPS stent and tract were dilated along entire length with the 8 mm balloon with overlapping inflations. Post pressures portal mean 15 mm Hg, right atrium 7 mmHg (gradient 8 mmHg). After further dilatation to 10 mm, post pressures portal mean 16 mm Hg, right atrium 10 mmHg (gradient 6 mmHg). Post TIPS venogram shows excellent flow through the stent. Continued hepatopetal flow in right and left portal branches. No hepatic vein stenosis. IMPRESSION: 1. TIPS remains patent, with 10 mmHg portosystemic gradient, increased from 4 mm gradient reported at the time of deployment. 2. Balloon  angioplasty of TIPS stent and tract using 8 and 10 mm balloons with final portosystemic gradient  6 mmHg. 3. 2.2 L pleural fluid removed during right thoracentesis. 4. Small volume residual/recurrent abdominal ascites; paracentesis deferred. Electronically Signed   By: Corlis Leak M.D.   On: 08/16/2023 15:46   DG Chest Port 1 View  Result Date: 08/16/2023 CLINICAL DATA:  Right pleural effusion post thoracentesis EXAM: PORTABLE CHEST - 1 VIEW COMPARISON:  the previous day's study FINDINGS: No pneumothorax. Resolution of right pleural effusion. Improved aeration at the right lung base. Coarse interstitial and alveolar opacities throughout both lungs. Patchy airspace opacities in the left lung base slightly improved. Heart size and mediastinal contours are within normal limits. No effusion. Visualized bones unremarkable. Left upper quadrant embolization coils. IMPRESSION: 1. Resolution of right pleural effusion. No pneumothorax. 2. Persistent coarse interstitial and alveolar opacities throughout both lungs. Electronically Signed   By: Corlis Leak M.D.   On: 08/16/2023 14:37    Lab Results: Recent Labs    08/16/23 1230 08/17/23 1543 08/18/23 0934  WBC 3.1* 6.5 2.6*  HGB 8.2* 8.6* 7.8*  HCT 25.4* 26.3* 23.5*  PLT 65* 92* 60*   BMET Recent Labs    08/16/23 1230 08/17/23 1543 08/18/23 0934  NA 140 136 136  K 3.6 4.0 3.7  CL 105 104 98  CO2 30 26 30   GLUCOSE 96 159* 191*  BUN 21 17 17   CREATININE 0.94 0.90 1.03*  CALCIUM 8.1* 7.8* 8.3*   LFT Recent Labs    08/18/23 0934  PROT 5.4*  ALBUMIN 2.9*  AST 30  ALT 17  ALKPHOS 71  BILITOT 1.9*   PT/INR Recent Labs    08/16/23 1230  LABPROT 21.5*  INR 1.8*     Scheduled Meds:  Chlorhexidine Gluconate Cloth  6 each Topical Daily   diclofenac Sodium  4 g Topical QID   enoxaparin (LOVENOX) injection  40 mg Subcutaneous Q24H   folic acid  1 mg Oral BID   furosemide  40 mg Intravenous Q12H   Gerhardt's butt cream   Topical BID    insulin aspart  0-15 Units Subcutaneous TID WC   insulin aspart  0-5 Units Subcutaneous QHS   insulin glargine-yfgn  20 Units Subcutaneous QHS   lactulose  20 g Oral TID   midodrine  5 mg Oral TID WC   mouth rinse  15 mL Mouth Rinse 4 times per day   pantoprazole  40 mg Oral Daily   pravastatin  20 mg Oral QHS   rifaximin  550 mg Oral BID   sertraline  150 mg Oral Daily   spironolactone  25 mg Oral Daily   Continuous Infusions:  sodium chloride        Patient profile:   67 year old female with 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 Persistent hepatic hydrothorax. transferred for TIPS revision.   Impression:   Decompensated MASH cirrhosis S/P balloon angioplasty/revision of existing TIPS 8/29 Hgb 7.8 (8.6) Platelets 60 Normal LFTs Very mild decreased kidney function with creatinine 1.03, GFR 59, BUN 17 MELD 3.0: 18 (14) MELD-Na: 16 (13) Chest x-ray this morning shows improvement in her right pleural effusion.  Similar pulmonary status.  Appears kidneys are tolerating diuresis well.   Pleural effusion CXR 8/29: worsening airspace disease and effusion compared to previous studies. asymmetric edema/infection, moderate right pleural effusion. CXR 8/30: Right small pleural effusion.   CKD   Type  2 DM   Plan:   -Continue aggressive diuresis with Lasix 40 Mg IV twice daily for 24 more hours (pending renal tolerance with close monitoring) - IV albumin - Continue spironolactone - Continue midodrine - Wean O2 as tolerated - Continue lactulose/rifaximin  Bayley Leanna Sato  08/18/2023, 12:36 PM

## 2023-08-18 NOTE — Progress Notes (Signed)
NAME:  Ariel Arnold, MRN:  [id number], DOB:  09-08-1956, LOS: 11 ADMISSION DATE:  08/07/2023, CONSULTATION DATE:  08/09/2023 REFERRING MD:  Blake Divine - TRH, CHIEF COMPLAINT:  Post-op hypoxia and hypotension    History of Present Illness:  67 year old woman who presented to [facility] ED 8/13 with SOB. 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  On ED presentation, found to have a right pleural effusion and a new supplemental oxygen requirement. She underwent thoracentesis x 2 (8/14 & 8/16) for concern of hepatic hydrothorax. She was transferred to [facility] 8/19 and underwent TIPS with IR on 8/21. She developed postprocedure hypotension requiring pressor support and hypoxia requiring BiPAP, prompting transfer to ICU. Eventually improved with diminished O2 requirement and was deemed stable for transfer to progressive.  Unfortunately, patient noted to have increasing O2 requirement 8/26-8/27 up to [facility] prompting PCCM reconsult for hepatic hydrothorax management/possible PleurX catheter consideration.  Of note, patient does not have established GI care at present; she was previously seen at [facility] (last 2022) for NASH cirrhosis but was lost to follow up. EGD 05/2021 demonstrated grade 1 esophageal varix, fundal varix with bleeding stigmata, moderately severe PHG and BRTO was recommended.  She was at one point on Lasix and spironolactone as well as Coreg for nonselective beta blockage for portal HTN.  Pertinent Medical History:  NASH cirrhosis with ascites, gastric varices  T2DM HTN PVD  Depression, Anxiety  Significant Hospital Events: Including procedures, antibiotic start and stop dates in addition to other pertinent events   8/13 Presented to [facility] with complaints of shortness of breath 8/14  Underwent [facility]acentesis [facility] 8/16 Underwent repeat thoracentesis at [facility] 8/19 Transferred to [facility] for interventional radiology evaluation of TIPS procedure 8/21 Underwent TIPS with interventional radiology, postprocedure patient was seen hypotensive and hypoxic resulting in initiation of pressors and PCCM consult 8/22 Weaned to room air  8/23 Increased O2 needs 8/24 BP stable holding midodrine so this was stopped. POCUS showed mod right effusion added lasix. Aline removed. Stable for Progressive. 8/27 PCCM reconsulted for increased O2 requirement. 8/28 TIPS revision, 2L R thoracentesis   Interim History / Subjective:  -1.3 L yesterday Afebrile CXR improving Patient sitting up in chair, she states she feels better today.   Objective:  Blood pressure (!) 102/53, pulse 91, temperature 97.8 F (36.6 C), temperature source Oral, resp. rate 20, height 5\' 8"  (1.727 m), weight 79.6 kg, SpO2 95%.        Intake/Output Summary (Last 24 hours) at 08/18/2023 1253 Last data filed at 08/18/2023 1127 Gross per 24 hour  Intake 381.04 ml  Output 3750 ml  Net -3368.96 ml   Filed Weights   08/15/23 0500 08/16/23 0449 08/17/23 0500  Weight: 83 kg 79.6 kg 79.6 kg   Physical Examination: General: frail, chronically ill appearing woman siting up in the chair without distress. Able to speak sentences without distress.  HEENT: [place]/AT, eyes anicteric Neuro: awake, alert, answering questions appropriately, CV: S1S2, RRR PULM:  Posterior right lower lung fields with rales, symmetric expansion and no increased work of breathing.  GI: non distended, soft, non tender.  Extremities: no cyanosis or clubbing Skin: warm, dry  Labs and CXR reviewed   Resolved Hospital Problem List:   Hypotension  Assessment & Plan:  Acute hypoxic respiratory failure with recurrent pleural effusion in  the setting of hepatic hydrothorax Recurrent R-sided hepatic hydrothorax S/p thoracentesis x 3  [8/14, 8/16, 8/21 (IR, co-therapy with para/TIPS)].  Off of supplemental O2 8/25; slowly increasing supplemental O2 requirement up to [facility] 8/26-8/27. PCCM re-engaged 8/27. -large volume thoras increase her risk of re-expansion pulmonary edema; recommendations are to not drain > 1-1.5L per day -long term goal should be to control effusions with diuretics; repeat thoracentesis will lead to significant protein wasting -Dr Chestine Spore previously discussed pleurX as an option for palliation, but if she has pleural effusions refractory to diuretics and TIPS, this is an end-stage disease process -Continue diuresis: Lasix, spironolactone   NASH Cirrhosis with coagulopathy, thrombocytopenia, ascites S/p TIPS 8/21 with post-procedure hypotension and respiratory distress, otherwise stable neurologic status. TIPS revision on 8/28 -appreciate GI's management -HE regimen- lactulose, rifaximin  CKD stage 3a Hypokalemia -midodrine  Type 2 Diabetes Mellitus -per primary  Hyperlipidemia  -per primary  Depression  Anxiety  - per primary   Best Practice (right click and "Reselect all SmartList Selections" daily)   Diet/type: Regular DVT prophylaxis: not indicated due to thrombocytopenia  GI prophylaxis: PPI Lines: N/A Foley:  N/A Code Status:  DNR Last date of multidisciplinary goals of care discussion   Signature:  Cindra Eves, DO 08/18/23 12:53 PM [facility]  For contact information, see Amion. If no response to pager, please call PCCM consult pager. After hours, 7PM- 7AM, please call Elink.

## 2023-08-18 NOTE — Progress Notes (Signed)
PROGRESS NOTE  Ariel Arnold:[id number] DOB: 31-Jul-1956 DOA: 08/07/2023 PCP: Joaquin Courts, DO   LOS: 11 days   Brief Narrative / Interim history: Ariel Arnold is a pleasant 67 y.o. female with medical history significant for NASH cirrhosis with ascites, insulin-dependent diabetes mellitus, depression, anxiety, and right heel ulcer who presented to [facility] on 08/01/2023 with shortness of breath. Patient had first paracentesis approximately a week prior to this presentation.   [facility] Hospital Course: Upon arrival to the ED, patient was found to have large right pleural effusion and a new supplemental oxygen requirement.  She was admitted to the hospitalist service and underwent therapeutic thoracenteses on 08/02/2023 and again on 08/04/2023.  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  Dr. Archer Asa of IR agreed to see the patient in consultation for consideration of TIPS.  [facility] for IR evaluation.   She underwent TIPS on procedure on 8/21 with postprocedure hypotension and respiratory distress requiring transfer to ICU for vasopressors.  She was transferred back to [facility] on 08/13/2023. She was on RA on 8/25, but back on 4 lit of [place] oxygen. CXR showing pulmonary edema with right sided pleural effusion.  Underwent thoracentesis, paracentesis as well as TIPS revision on 8/28  Subjective / 24h Interval events: Overall better, but too early to tell how her breathing is  Assesement and Plan: Principal Problem:   Decompensated hepatic cirrhosis (HCC) Active Problems:   Acute respiratory failure with hypoxia (HCC)   Recurrent pleural effusion on right   Type 2 diabetes mellitus with diabetic neuropathy, unspecified (HCC)   Thrombocytopenia (HCC)   Essential hypertension   CKD stage G3a/A3, GFR 45-59 and albumin creatinine ratio >300 mg/g (HCC)   Major depressive disorder, single episode, unspecified   Hypomagnesemia    History of peptic ulcer disease   Mixed hyperlipidemia   Pressure injury of skin   Acute pulmonary edema (HCC)   Pleural effusion   Principal problem Acute hypoxic respiratory failure secondary to recurrent pleural effusion due to hepatic hydrothorax and pulmonary edema - S/p thoracentesis x 3, on 8/14, 8/16 and 8/21, also underwent TIPS on 8/21 -Dyspnea improved, continue supplemental oxygen and wean off to room air as tolerated -Chest x-ray with evidence of fluid overload still, continue Lasix, spironolactone -IR consulted, status post TIPS revision 8/28   Active problems NASH cirrhosis - S/p TIPS on 08/09/2023 with postprocedure hypotension and respiratory distress. Continue with lactulose and rifaximin   Stage IIIa CKD -creatinine stable while on diuretics   Hypokalemia, hypomagnesemia-continue to replace today with goal potassium 4 and magnesium 2  Anemia of chronic disease - From cirrhosis.  Hemoglobin likely lower today, monitor, transfuse for less than 7   Chronic thrombocytopenia - From cirrhosis, continue to monitor   Depression/anxiety - Continue with sertraline.   Hyperlipidemia - Continue with Pravachol   In view of poor progression, deconditioning palliative care consulted, patient declined hospice, wants to go home with [facility].    Lab Results  Component Value Date   HGBA1C 5.0 08/08/2023   CBG (last 3)  Recent Labs    08/18/23 0421 08/18/23 0828 08/18/23 1000  GLUCAP 157* 141* 172*    Scheduled Meds:  Chlorhexidine Gluconate Cloth  6 each Topical Daily   diclofenac Sodium  4 g Topical QID   enoxaparin (LOVENOX) injection  40 mg Subcutaneous Q24H   folic acid  1 mg Oral BID   furosemide  40 mg Intravenous Q12H  Gerhardt's butt cream   Topical BID   insulin aspart  0-15 Units Subcutaneous TID WC   insulin aspart  0-5 Units Subcutaneous QHS   insulin glargine-yfgn  20 Units Subcutaneous QHS   lactulose  20 g Oral TID   midodrine  5 mg Oral TID WC    mouth rinse  15 mL Mouth Rinse 4 times per day   pantoprazole  40 mg Oral Daily   potassium chloride  30 mEq Oral Once   pravastatin  20 mg Oral QHS   rifaximin  550 mg Oral BID   sertraline  150 mg Oral Daily   spironolactone  25 mg Oral Daily   Continuous Infusions:  sodium chloride     magnesium sulfate bolus IVPB     PRN Meds:.albuterol, gabapentin, hydrOXYzine, iohexol, ondansetron **OR** ondansetron (ZOFRAN) IV, mouth rinse, oxyCODONE  Current Outpatient Medications  Medication Instructions   albuterol (VENTOLIN HFA) 108 (90 Base) MCG/ACT inhaler 2 puffs, Inhalation, 2 times daily PRN   ascorbic acid (VITAMIN C) 500 mg, Oral, 2 times daily   Calcium Carbonate-Vit D-Min (CALCIUM 1200) 1200-1000 MG-UNIT CHEW 2 tablets, Oral, Daily   carvedilol (COREG) 6.25 mg, Oral, 2 times daily   clotrimazole-betamethasone (LOTRISONE) cream 1 Application, Topical, Daily PRN   diclofenac Sodium (VOLTAREN) 2 g, Topical, 2 times daily   ferrous sulfate 325 mg, Oral, 2 times daily   folic acid (FOLVITE) 1 mg, Oral, 2 times daily   furosemide (LASIX) 20 mg, Oral, Weekly   gabapentin (NEURONTIN) 600 mg, Oral, 3 times daily PRN   hydrOXYzine (ATARAX) 25 mg, Oral, Daily   insulin aspart (NOVOLOG) 100 UNIT/ML injection 0-20 Units, Subcutaneous, 3 times daily with meal CBG < 70: Implement Hypoglycemia measures CBG 70 - 120: 0 units CBG 121 - 150: 3 units CBG 151 - 200: 4 units CBG 201 - 250: 7 units CBG 251 - 300: 11 units CBG 301 - 350: 15 units CBG 351 - 400: 20 units CBG > 400: call MD   isosorbide mononitrate (IMDUR) 30 MG 24 hr tablet Oral   ketorolac (ACULAR) 0.5 % ophthalmic solution 1 drop, Both Eyes, 4 times daily PRN   lactulose (CHRONULAC) 10 g, Oral, 2 times daily, for ammonia levels   oxyCODONE (OXY IR/ROXICODONE) 5 mg, Oral, Every 6 hours PRN   pantoprazole (PROTONIX) 40 mg, Oral, Daily PRN   pravastatin (PRAVACHOL) 20 mg, Oral, Daily at bedtime, Take after completion of daptomycin    rifaximin (XIFAXAN) 550 mg, Oral, 2 times daily   sertraline (ZOLOFT) 200 mg, Oral, Daily   sodium hypochlorite (DAKIN'S 1/2 STRENGTH) external solution 1 Application, Irrigation, Daily   spironolactone (ALDACTONE) 50 mg, Oral, Daily PRN   Toujeo Max SoloStar 20 Units, Subcutaneous, Daily at bedtime   Vitamin D, Ergocalciferol, (DRISDOL) 1.25 MG (50000 UNIT) CAPS capsule 1 capsule, Oral, Every 7 days, Mondays    Diet Orders (From admission, onward)     Start     Ordered   08/16/23 1555  Diet regular Room service appropriate? Yes; Fluid consistency: Thin  Diet effective now       Question Answer Comment  Room service appropriate? Yes   Fluid consistency: Thin      08/16/23 1555            DVT prophylaxis: enoxaparin (LOVENOX) injection 40 mg Start: 08/10/23 1015 SCDs Start: 08/07/23 1524   Lab Results  Component Value Date   PLT 60 (L) 08/18/2023      Code Status:  DNR  Family Communication: no family at bedside   Status is: Inpatient  Remains inpatient appropriate because: severity of illness  Level of care: Med-Surg  Consultants:  IR GI PCCM Palliative  Objective: Vitals:   08/17/23 1642 08/17/23 2013 08/18/23 0422 08/18/23 0819  BP: (!) 102/91 (!) 109/44 (!) 104/43 (!) 102/53  Pulse: 86 90 90 91  Resp: 16 17  20   Temp: 98.8 F (37.1 C) 98.3 F (36.8 C)  97.8 F (36.6 C)  TempSrc: Oral Oral  Oral  SpO2: 95% 100% 94% 95%  Weight:      Height:        Intake/Output Summary (Last 24 hours) at 08/18/2023 1031 Last data filed at 08/18/2023 0109 Gross per 24 hour  Intake 381.04 ml  Output 1750 ml  Net -1368.96 ml   Wt Readings from Last 3 Encounters:  08/17/23 79.6 kg  08/21/22 86.7 kg  06/02/22 85.6 kg    Examination:  Constitutional: NAD Eyes: lids and conjunctivae normal, no scleral icterus ENMT: mmm Neck: normal, supple Respiratory: clear to auscultation bilaterally, no wheezing, no crackles.  Cardiovascular: Regular rate and rhythm, no  murmurs / rubs / gallops. No LE edema. Abdomen: soft, no distention, no tenderness. Bowel sounds positive.   Data Reviewed: I have independently reviewed following labs and imaging studies   CBC Recent Labs  Lab 08/14/23 0416 08/15/23 0319 08/16/23 1230 08/17/23 1543 08/18/23 0934  WBC 3.2* 3.3* 3.1* 6.5 2.6*  HGB 7.4* 8.2* 8.2* 8.6* 7.8*  HCT 22.7* 25.4* 25.4* 26.3* 23.5*  PLT 60* 63* 65* 92* 60*  MCV 97.0 99.2 99.2 95.6 97.5  MCH 31.6 32.0 32.0 31.3 32.4  MCHC 32.6 32.3 32.3 32.7 33.2  RDW 15.0 15.3 15.4 15.9* 15.4  LYMPHSABS 1.0 1.0  --   --   --   MONOABS 0.3 0.3  --   --   --   EOSABS 0.3 0.3  --   --   --   BASOSABS 0.0 0.0  --   --   --     Recent Labs  Lab 08/12/23 0913 08/13/23 0411 08/14/23 0416 08/15/23 0319 08/16/23 1230 08/17/23 1543 08/18/23 0934  NA 138   < > 139 139 140 136 136  K 3.2*   < > 3.4* 4.0 3.6 4.0 3.7  CL 104   < > 106 106 105 104 98  CO2 26   < > 28 26 30 26 30   GLUCOSE 158*   < > 138* 165* 96 159* 191*  BUN 33*   < > 35* 28* 21 17 17   CREATININE 1.16*   < > 1.17* 1.04* 0.94 0.90 1.03*  CALCIUM 8.0*   < > 8.1* 8.0* 8.1* 7.8* 8.3*  AST  --   --  28  --  30 39 30  ALT  --   --  15  --  17 19 17   ALKPHOS  --   --  56  --  70 92 71  BILITOT  --   --  1.9*  --  1.0 1.7* 1.9*  ALBUMIN  --   --  2.4*  --  2.6* 2.6* 2.9*  MG 1.9  --   --   --   --  1.5* 1.5*  INR  --   --   --   --  1.8*  --   --   AMMONIA 31  --   --   --   --   --   --  BNP  --   --   --  353.4*  --   --   --    < > = values in this interval not displayed.    ------------------------------------------------------------------------------------------------------------------ No results for input(s): "CHOL", "HDL", "LDLCALC", "TRIG", "CHOLHDL", "LDLDIRECT" in the last 72 hours.  Lab Results  Component Value Date   HGBA1C 5.0 08/08/2023   ------------------------------------------------------------------------------------------------------------------ No results for  input(s): "TSH", "T4TOTAL", "T3FREE", "THYROIDAB" in the last 72 hours.  Invalid input(s): "FREET3"  Cardiac Enzymes No results for input(s): "CKMB", "TROPONINI", "MYOGLOBIN" in the last 168 hours.  Invalid input(s): "CK" ------------------------------------------------------------------------------------------------------------------    Component Value Date/Time   BNP 353.4 (H) 08/15/2023 0319    CBG: Recent Labs  Lab 08/17/23 1645 08/17/23 2126 08/18/23 0421 08/18/23 0828 08/18/23 1000  GLUCAP 144* 145* 157* 141* 172*    Recent Results (from the past 240 hour(s))  MRSA Next Gen by PCR, Nasal     Status: Abnormal   Collection Time: 08/09/23 11:11 AM   Specimen: Nasal Mucosa; Nasal Swab  Result Value Ref Range Status   MRSA by PCR Next Gen DETECTED (A) NOT DETECTED Final    Comment: RESULT CALLED TO, READ BACK BY AND VERIFIED WITH: RN DILLIN.K AT 1409 ON 08/09/2023 BY T.SAAD. (NOTE) The GeneXpert MRSA Assay (FDA approved for NASAL specimens only), is one component of a comprehensive MRSA colonization surveillance program. It is not intended to diagnose MRSA infection nor to guide or monitor treatment for MRSA infections. Test performance is not FDA approved in patients less than 82 years old. Performed at [facility], [address], [place], [place] [address]      Radiology Studies: DG Chest 2 View  Result Date: 08/18/2023 CLINICAL DATA:  Pleural effusion EXAM: CHEST - 2 VIEW COMPARISON:  08/17/2023 FINDINGS: Unchanged AP portable chest radiograph. Cardiomegaly. Diffuse bilateral interstitial pulmonary opacity. Small right pleural effusion. The visualized skeletal structures are unremarkable. IMPRESSION: Cardiomegaly with diffuse bilateral interstitial pulmonary opacity, consistent with edema or infection. No new airspace opacity. Small right pleural effusion. Electronically Signed   By: Jearld Lesch M.D.   On: 08/18/2023 08:24     Pamella Pert, MD,  PhD [facility]  Between 7 am - 7 pm I am available, please contact me via Amion (for emergencies) or Securechat (non urgent messages)  Between 7 pm - 7 am I am not available, please contact night coverage MD/APP via Amion

## 2023-08-18 NOTE — Progress Notes (Signed)
Occupational Therapy Treatment Patient Details Name: Ariel Arnold MRN: [id number] DOB: September 15, 1956 Today's Date: 08/18/2023   History of present illness 67 yo female admitted to [facility] 8/13 with SOB with Rt pleural effusion. 8/14 & 8/16 thoracentesis. 8/19 transfer to [facility]. 8/21 TIPS procedure with post procedure hypotension. Plan for thoracentesis 8/23. s/p 8/28 thoracentesis and TIPS revision. PMHx; NASH cirrhosis with ascites, T2DM, HTN, PVD, osteomyelitis, depression, anxiety, Lt BKA   OT comments  Patient demonstrating good gains with min assist to get to EOB and able to demonstrate fair sitting balance. Patient able to transfer to recliner with RW and mod assist. Patient met grooming goal of setup while sitting up. Patient will benefit from continued inpatient follow up therapy, <3 hours/day to continue to address bathing, dressing, and toilet transfers. Acute OT to continue to follow.       If plan is discharge home, recommend the following:  A lot of help with walking and/or transfers;A lot of help with bathing/dressing/bathroom   Equipment Recommendations  None recommended by OT    Recommendations for Other Services      Precautions / Restrictions Precautions Precautions: Fall;Other (comment) Precaution Comments: Lt BKA Restrictions Weight Bearing Restrictions: Yes Other Position/Activity Restrictions: hx of L BKA       Mobility Bed Mobility Overal bed mobility: Needs Assistance Bed Mobility: Supine to Sit     Supine to sit: HOB elevated, Used rails, Min assist     General bed mobility comments: min assist to scoot forward    Transfers Overall transfer level: Needs assistance Equipment used: Rolling walker (2 wheels) Transfers: Sit to/from Stand, Bed to chair/wheelchair/BSC Sit to Stand: Mod assist Stand pivot transfers: Mod assist         General transfer comment: cues for safety and mod assist to stedy     Balance Overall balance assessment:  Needs assistance Sitting-balance support: No upper extremity supported Sitting balance-Leahy Scale: Fair     Standing balance support: Bilateral upper extremity supported Standing balance-Leahy Scale: Poor Standing balance comment: reliant on BUE support                           ADL either performed or assessed with clinical judgement   ADL Overall ADL's : Needs assistance/impaired     Grooming: Wash/dry hands;Wash/dry face;Oral care;Brushing hair;Set up;Sitting               Lower Body Dressing: Maximal assistance   Toilet Transfer: Moderate assistance;Rolling walker (2 wheels) Toilet Transfer Details (indicate cue type and reason): simulated to recliner                Extremity/Trunk Assessment              Vision       Perception     Praxis      Cognition Arousal: Alert Behavior During Therapy: WFL for tasks assessed/performed Overall Cognitive Status: Impaired/Different from baseline Area of Impairment: Attention, Following commands, Safety/judgement, Awareness, Problem solving, Memory                     Memory: Decreased short-term memory Following Commands: Follows multi-step commands consistently, Follows multi-step commands inconsistently Safety/Judgement: Decreased awareness of safety, Decreased awareness of deficits Awareness: Anticipatory Problem Solving: Slow processing          Exercises      Shoulder Instructions       General Comments  Pertinent Vitals/ Pain       Pain Assessment Pain Assessment: Faces Faces Pain Scale: Hurts a little bit Pain Location: abdomen, left side Pain Descriptors / Indicators: Sore Pain Intervention(s): Limited activity within patient's tolerance, Monitored during session  Home Living                                          Prior Functioning/Environment              Frequency  Min 1X/week        Progress Toward Goals  OT Goals(current  goals can now be found in the care plan section)  Progress towards OT goals: Progressing toward goals  Acute Rehab OT Goals Patient Stated Goal: get better OT Goal Formulation: With patient Time For Goal Achievement: 09/01/23 Potential to Achieve Goals: Fair ADL Goals Pt Will Perform Grooming: with set-up;sitting Pt Will Perform Upper Body Dressing: with min assist;sitting Pt Will Perform Lower Body Dressing: with mod assist;sit to/from stand;sitting/lateral leans Pt Will Transfer to Toilet: with min assist;squat pivot transfer;stand pivot transfer;bedside commode Additional ADL Goal #1: P to complete bed mobility with Min A in prep for OOB ADL  Plan      Co-evaluation                 AM-PAC OT "6 Clicks" Daily Activity     Outcome Measure   Help from another person eating meals?: A Little Help from another person taking care of personal grooming?: A Little Help from another person toileting, which includes using toliet, bedpan, or urinal?: A Lot Help from another person bathing (including washing, rinsing, drying)?: A Lot Help from another person to put on and taking off regular upper body clothing?: A Lot Help from another person to put on and taking off regular lower body clothing?: Total 6 Click Score: 13    End of Session Equipment Utilized During Treatment: Oxygen;Rolling walker (2 wheels)  OT Visit Diagnosis: Unsteadiness on feet (R26.81);Other abnormalities of gait and mobility (R26.89);Muscle weakness (generalized) (M62.81);Other symptoms and signs involving cognitive function   Activity Tolerance Patient tolerated treatment well   Patient Left in chair;with call bell/phone within reach;with chair alarm set;with nursing/sitter in room   Nurse Communication Mobility status        Time: 4098-1191 OT Time Calculation (min): 30 min  Charges: OT General Charges $OT Visit: 1 Visit OT Treatments $Self Care/Home Management : 8-22 mins $Therapeutic Activity:  8-22 mins  Alfonse Flavors, OTA Acute Rehabilitation Services  Office [phone]   Dewain Penning 08/18/2023, 2:16 PM

## 2023-08-18 NOTE — Plan of Care (Signed)

## 2023-08-19 LAB — COMPREHENSIVE METABOLIC PANEL
ALT: 19 U/L (ref 0–44)
AST: 29 U/L (ref 15–41)
Albumin: 2.7 g/dL — ABNORMAL LOW (ref 3.5–5.0)
Alkaline Phosphatase: 72 U/L (ref 38–126)
Anion gap: 5 (ref 5–15)
BUN: 19 mg/dL (ref 8–23)
CO2: 29 mmol/L (ref 22–32)
Calcium: 7.7 mg/dL — ABNORMAL LOW (ref 8.9–10.3)
Chloride: 99 mmol/L (ref 98–111)
Creatinine, Ser: 1.03 mg/dL — ABNORMAL HIGH (ref 0.44–1.00)
GFR, Estimated: 59 mL/min — ABNORMAL LOW (ref >60)
Glucose, Bld: 228 mg/dL — ABNORMAL HIGH (ref 70–99)
Potassium: 4.5 mmol/L (ref 3.5–5.1)
Sodium: 133 mmol/L — ABNORMAL LOW (ref 135–145)
Total Bilirubin: 1.9 mg/dL — ABNORMAL HIGH (ref 0.3–1.2)
Total Protein: 5.4 g/dL — ABNORMAL LOW (ref 6.5–8.1)

## 2023-08-19 LAB — GLUCOSE, CAPILLARY
Glucose-Capillary: 119 mg/dL — ABNORMAL HIGH (ref 70–99)
Glucose-Capillary: 143 mg/dL — ABNORMAL HIGH (ref 70–99)
Glucose-Capillary: 165 mg/dL — ABNORMAL HIGH (ref 70–99)
Glucose-Capillary: 211 mg/dL — ABNORMAL HIGH (ref 70–99)
Glucose-Capillary: 214 mg/dL — ABNORMAL HIGH (ref 70–99)
Glucose-Capillary: 230 mg/dL — ABNORMAL HIGH (ref 70–99)

## 2023-08-19 LAB — CBC
HCT: 24.6 % — ABNORMAL LOW (ref 36.0–46.0)
Hemoglobin: 7.9 g/dL — ABNORMAL LOW (ref 12.0–15.0)
MCH: 32 pg (ref 26.0–34.0)
MCHC: 32.1 g/dL (ref 30.0–36.0)
MCV: 99.6 fL (ref 80.0–100.0)
Platelets: 68 10*3/uL — ABNORMAL LOW (ref 150–400)
RBC: 2.47 MIL/uL — ABNORMAL LOW (ref 3.87–5.11)
RDW: 15.5 % (ref 11.5–15.5)
WBC: 2.4 10*3/uL — ABNORMAL LOW (ref 4.0–10.5)
nRBC: 0 % (ref 0.0–0.2)

## 2023-08-19 LAB — MAGNESIUM: Magnesium: 1.7 mg/dL (ref 1.7–2.4)

## 2023-08-19 NOTE — Progress Notes (Signed)
PROGRESS NOTE  Ariel Arnold GGY:[id number] DOB: 1956-09-07 DOA: 08/07/2023 PCP: Ariel Courts, DO   LOS: 12 days   Brief Narrative / Interim history: Ariel Arnold is a pleasant 68 y.o. female with medical history significant for NASH cirrhosis with ascites, insulin-dependent diabetes mellitus, depression, anxiety, and right heel ulcer who presented to [facility] on 08/01/2023 with shortness of breath. Patient had first paracentesis approximately a week prior to this presentation.   [facility] Course: Upon arrival to the ED, patient was found to have large right pleural effusion and a new supplemental oxygen requirement.  She was admitted to the hospitalist service and underwent therapeutic thoracenteses on 08/02/2023 and again on 08/04/2023.  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  Dr. Archer Asa of IR agreed to see the patient in consultation for consideration of TIPS.  She was transferred to [facility] under hospitalist for IR evaluation.   She underwent TIPS on procedure on 8/21 with postprocedure hypotension and respiratory distress requiring transfer to ICU for vasopressors.  She was transferred back to [facility] on 08/13/2023. She was on RA on 8/25, but back on 4 lit of [place] oxygen. CXR showing pulmonary edema with right sided pleural effusion.  Underwent thoracentesis, paracentesis as well as TIPS revision on 8/28  Subjective / 24h Interval events: Feels better.  Back on oxygen this morning  Assesement and Plan: Principal Problem:   Decompensated hepatic cirrhosis (HCC) Active Problems:   Acute respiratory failure with hypoxia (HCC)   Recurrent pleural effusion on right   Type 2 diabetes mellitus with diabetic neuropathy, unspecified (HCC)   Thrombocytopenia (HCC)   Essential hypertension   CKD stage G3a/A3, GFR 45-59 and albumin creatinine ratio >300 mg/g (HCC)   Major depressive disorder, single episode, unspecified   Hypomagnesemia   History of peptic  ulcer disease   Mixed hyperlipidemia   Pressure injury of skin   Acute pulmonary edema (HCC)   Pleural effusion on right   Principal problem Acute hypoxic respiratory failure secondary to recurrent pleural effusion due to hepatic hydrothorax and pulmonary edema - S/p thoracentesis x 3, on 8/14, 8/16 and 8/21, also underwent TIPS on 8/21 -Dyspnea improved, continue supplemental oxygen and wean off to room air as tolerated -Lasix, spironolactone.  Probably convert Lasix to oral in the next day or so, discussed with GI will do 40 mg p.o. twice daily as well as 100 mg of spironolactone -IR consulted, status post TIPS revision 8/28.  Repeat chest x-ray tomorrow   Active problems NASH cirrhosis - S/p TIPS on 08/09/2023 with postprocedure hypotension and respiratory distress. Continue with lactulose and rifaximin   Stage IIIa CKD -creat stable, continue to monitor   Hypokalemia, hypomagnesemia-stable, monitor  Anemia of chronic disease - From cirrhosis.  Hemoglobin likely lower today, monitor, transfuse for less than 7   Chronic thrombocytopenia - From cirrhosis, continue to monitor   Depression/anxiety - Continue with sertraline.   Hyperlipidemia - Continue with Pravachol   In view of poor progression, deconditioning palliative care consulted, patient declined hospice, wants to go home with [facility].    Lab Results  Component Value Date   HGBA1C 5.0 08/08/2023   CBG (last 3)  Recent Labs    08/19/23 0505 08/19/23 0801 08/19/23 1147  GLUCAP 143* 165* 230*    Scheduled Meds:  Chlorhexidine Gluconate Cloth  6 each Topical Daily   diclofenac Sodium  4 g Topical QID   enoxaparin (LOVENOX) injection  40 mg Subcutaneous Q24H   folic  acid  1 mg Oral BID   furosemide  40 mg Intravenous Q12H   Gerhardt's butt cream   Topical BID   insulin aspart  0-15 Units Subcutaneous TID WC   insulin aspart  0-5 Units Subcutaneous QHS   insulin glargine-yfgn  20 Units Subcutaneous QHS   lactulose   20 g Oral TID   midodrine  5 mg Oral TID WC   mouth rinse  15 mL Mouth Rinse 4 times per day   pantoprazole  40 mg Oral Daily   pravastatin  20 mg Oral QHS   rifaximin  550 mg Oral BID   sertraline  150 mg Oral Daily   spironolactone  25 mg Oral Daily   Continuous Infusions:  sodium chloride     PRN Meds:.albuterol, gabapentin, hydrOXYzine, iohexol, ondansetron **OR** ondansetron (ZOFRAN) IV, mouth rinse, oxyCODONE  Current Outpatient Medications  Medication Instructions   albuterol (VENTOLIN HFA) 108 (90 Base) MCG/ACT inhaler 2 puffs, Inhalation, 2 times daily PRN   ascorbic acid (VITAMIN C) 500 mg, Oral, 2 times daily   Calcium Carbonate-Vit D-Min (CALCIUM 1200) 1200-1000 MG-UNIT CHEW 2 tablets, Oral, Daily   carvedilol (COREG) 6.25 mg, Oral, 2 times daily   clotrimazole-betamethasone (LOTRISONE) cream 1 Application, Topical, Daily PRN   diclofenac Sodium (VOLTAREN) 2 g, Topical, 2 times daily   ferrous sulfate 325 mg, Oral, 2 times daily   folic acid (FOLVITE) 1 mg, Oral, 2 times daily   furosemide (LASIX) 20 mg, Oral, Weekly   gabapentin (NEURONTIN) 600 mg, Oral, 3 times daily PRN   hydrOXYzine (ATARAX) 25 mg, Oral, Daily   insulin aspart (NOVOLOG) 100 UNIT/ML injection 0-20 Units, Subcutaneous, 3 times daily with meal CBG < 70: Implement Hypoglycemia measures CBG 70 - 120: 0 units CBG 121 - 150: 3 units CBG 151 - 200: 4 units CBG 201 - 250: 7 units CBG 251 - 300: 11 units CBG 301 - 350: 15 units CBG 351 - 400: 20 units CBG > 400: call MD   isosorbide mononitrate (IMDUR) 30 MG 24 hr tablet Oral   ketorolac (ACULAR) 0.5 % ophthalmic solution 1 drop, Both Eyes, 4 times daily PRN   lactulose (CHRONULAC) 10 g, Oral, 2 times daily, for ammonia levels   oxyCODONE (OXY IR/ROXICODONE) 5 mg, Oral, Every 6 hours PRN   pantoprazole (PROTONIX) 40 mg, Oral, Daily PRN   pravastatin (PRAVACHOL) 20 mg, Oral, Daily at bedtime, Take after completion of daptomycin   rifaximin (XIFAXAN) 550 mg,  Oral, 2 times daily   sertraline (ZOLOFT) 200 mg, Oral, Daily   sodium hypochlorite (DAKIN'S 1/2 STRENGTH) external solution 1 Application, Irrigation, Daily   spironolactone (ALDACTONE) 50 mg, Oral, Daily PRN   Toujeo Max SoloStar 20 Units, Subcutaneous, Daily at bedtime   Vitamin D, Ergocalciferol, (DRISDOL) 1.25 MG (50000 UNIT) CAPS capsule 1 capsule, Oral, Every 7 days, Mondays    Diet Orders (From admission, onward)     Start     Ordered   08/16/23 1555  Diet regular Room service appropriate? Yes; Fluid consistency: Thin  Diet effective now       Question Answer Comment  Room service appropriate? Yes   Fluid consistency: Thin      08/16/23 1555            DVT prophylaxis: enoxaparin (LOVENOX) injection 40 mg Start: 08/10/23 1015 SCDs Start: 08/07/23 1524   Lab Results  Component Value Date   PLT 68 (L) 08/19/2023      Code Status: DNR  Family Communication: no family at bedside   Status is: Inpatient  Remains inpatient appropriate because: severity of illness  Level of care: Med-Surg  Consultants:  IR GI PCCM Palliative  Objective: Vitals:   08/18/23 2358 08/19/23 0500 08/19/23 0642 08/19/23 0759  BP: (!) 106/39  (!) 105/43 (!) 99/40  Pulse: 80  79 72  Resp: 16  16 14   Temp: 98.5 F (36.9 C)  98.1 F (36.7 C) 98.2 F (36.8 C)  TempSrc: Oral  Oral Oral  SpO2: 97%  99% 100%  Weight:  75.5 kg    Height:        Intake/Output Summary (Last 24 hours) at 08/19/2023 1339 Last data filed at 08/19/2023 1138 Gross per 24 hour  Intake 250 ml  Output 3550 ml  Net -3300 ml   Wt Readings from Last 3 Encounters:  08/19/23 75.5 kg  08/21/22 86.7 kg  06/02/22 85.6 kg    Examination:  Constitutional: NAD Eyes: lids and conjunctivae normal, no scleral icterus ENMT: mmm Neck: normal, supple Respiratory: clear to auscultation bilaterally, no wheezing, no crackles.  Cardiovascular: Regular rate and rhythm, no murmurs / rubs / gallops. Trace  LE  edema. Abdomen: soft, no distention, no tenderness. Bowel sounds positive.   Data Reviewed: I have independently reviewed following labs and imaging studies   CBC Recent Labs  Lab 08/14/23 0416 08/15/23 0319 08/16/23 1230 08/17/23 1543 08/18/23 0934 08/19/23 1001  WBC 3.2* 3.3* 3.1* 6.5 2.6* 2.4*  HGB 7.4* 8.2* 8.2* 8.6* 7.8* 7.9*  HCT 22.7* 25.4* 25.4* 26.3* 23.5* 24.6*  PLT 60* 63* 65* 92* 60* 68*  MCV 97.0 99.2 99.2 95.6 97.5 99.6  MCH 31.6 32.0 32.0 31.3 32.4 32.0  MCHC 32.6 32.3 32.3 32.7 33.2 32.1  RDW 15.0 15.3 15.4 15.9* 15.4 15.5  LYMPHSABS 1.0 1.0  --   --   --   --   MONOABS 0.3 0.3  --   --   --   --   EOSABS 0.3 0.3  --   --   --   --   BASOSABS 0.0 0.0  --   --   --   --     Recent Labs  Lab 08/14/23 0416 08/15/23 0319 08/16/23 1230 08/17/23 1543 08/18/23 0934 08/19/23 1001  NA 139 139 140 136 136 133*  K 3.4* 4.0 3.6 4.0 3.7 4.5  CL 106 106 105 104 98 99  CO2 28 26 30 26 30 29   GLUCOSE 138* 165* 96 159* 191* 228*  BUN 35* 28* 21 17 17 19   CREATININE 1.17* 1.04* 0.94 0.90 1.03* 1.03*  CALCIUM 8.1* 8.0* 8.1* 7.8* 8.3* 7.7*  AST 28  --  30 39 30 29  ALT 15  --  17 19 17 19   ALKPHOS 56  --  70 92 71 72  BILITOT 1.9*  --  1.0 1.7* 1.9* 1.9*  ALBUMIN 2.4*  --  2.6* 2.6* 2.9* 2.7*  MG  --   --   --  1.5* 1.5* 1.7  INR  --   --  1.8*  --   --   --   BNP  --  353.4*  --   --   --   --     ------------------------------------------------------------------------------------------------------------------ No results for input(s): "CHOL", "HDL", "LDLCALC", "TRIG", "CHOLHDL", "LDLDIRECT" in the last 72 hours.  Lab Results  Component Value Date   HGBA1C 5.0 08/08/2023   ------------------------------------------------------------------------------------------------------------------ No results for input(s): "TSH", "T4TOTAL", "T3FREE", "THYROIDAB" in the  last 72 hours.  Invalid input(s): "FREET3"  Cardiac Enzymes No results for input(s): "CKMB",  "TROPONINI", "MYOGLOBIN" in the last 168 hours.  Invalid input(s): "CK" ------------------------------------------------------------------------------------------------------------------    Component Value Date/Time   BNP 353.4 (H) 08/15/2023 0319    CBG: Recent Labs  Lab 08/18/23 2152 08/19/23 0054 08/19/23 0505 08/19/23 0801 08/19/23 1147  GLUCAP 206* 211* 143* 165* 230*    No results found for this or any previous visit (from the past 240 hour(s)).    Radiology Studies: No results found.   Pamella Pert, MD, PhD [facility]  Between 7 am - 7 pm I am available, please contact me via Amion (for emergencies) or Securechat (non urgent messages)  Between 7 pm - 7 am I am not available, please contact night coverage MD/APP via Amion

## 2023-08-19 NOTE — Plan of Care (Signed)
  Problem: Education: Goal: Knowledge of General Education information will improve Description: Including pain rating scale, medication(s)/side effects and non-pharmacologic comfort measures Outcome: Progressing   Problem: Health Behavior/Discharge Planning: Goal: Ability to manage health-related needs will improve Outcome: Progressing   Problem: Clinical Measurements: Goal: Will remain free from infection Outcome: Progressing Goal: Diagnostic test results will improve Outcome: Progressing Goal: Respiratory complications will improve Outcome: Progressing Goal: Cardiovascular complication will be avoided Outcome: Progressing   Problem: Activity: Goal: Risk for activity intolerance will decrease Outcome: Progressing   Problem: Nutrition: Goal: Adequate nutrition will be maintained Outcome: Progressing   Problem: Coping: Goal: Level of anxiety will decrease Outcome: Progressing   Problem: Elimination: Goal: Will not experience complications related to bowel motility Outcome: Progressing Goal: Will not experience complications related to urinary retention Outcome: Progressing   Problem: Pain Managment: Goal: General experience of comfort will improve Outcome: Progressing   Problem: Safety: Goal: Ability to remain free from injury will improve Outcome: Progressing   Problem: Skin Integrity: Goal: Risk for impaired skin integrity will decrease Outcome: Progressing   Problem: Education: Goal: Ability to describe self-care measures that may prevent or decrease complications (Diabetes Survival Skills Education) will improve Outcome: Progressing Goal: Individualized Educational Video(s) Outcome: Progressing   Problem: Coping: Goal: Ability to adjust to condition or change in health will improve Outcome: Progressing   Problem: Fluid Volume: Goal: Ability to maintain a balanced intake and output will improve Outcome: Progressing   Problem: Health Behavior/Discharge  Planning: Goal: Ability to identify and utilize available resources and services will improve Outcome: Progressing Goal: Ability to manage health-related needs will improve Outcome: Progressing   Problem: Metabolic: Goal: Ability to maintain appropriate glucose levels will improve Outcome: Progressing   Problem: Nutritional: Goal: Maintenance of adequate nutrition will improve Outcome: Progressing Goal: Progress toward achieving an optimal weight will improve Outcome: Progressing   Problem: Skin Integrity: Goal: Risk for impaired skin integrity will decrease Outcome: Progressing   Problem: Tissue Perfusion: Goal: Adequacy of tissue perfusion will improve Outcome: Progressing   Problem: Education: Goal: Understanding of CV disease, CV risk reduction, and recovery process will improve Outcome: Progressing Goal: Individualized Educational Video(s) Outcome: Progressing   Problem: Activity: Goal: Ability to return to baseline activity level will improve Outcome: Progressing   Problem: Cardiovascular: Goal: Ability to achieve and maintain adequate cardiovascular perfusion will improve Outcome: Progressing Goal: Vascular access site(s) Level 0-1 will be maintained Outcome: Progressing

## 2023-08-20 ENCOUNTER — Inpatient Hospital Stay (HOSPITAL_COMMUNITY): Payer: Medicare HMO

## 2023-08-20 LAB — CBC
HCT: 23.1 % — ABNORMAL LOW (ref 36.0–46.0)
Hemoglobin: 7.5 g/dL — ABNORMAL LOW (ref 12.0–15.0)
MCH: 31 pg (ref 26.0–34.0)
MCHC: 32.5 g/dL (ref 30.0–36.0)
MCV: 95.5 fL (ref 80.0–100.0)
Platelets: 71 10*3/uL — ABNORMAL LOW (ref 150–400)
RBC: 2.42 MIL/uL — ABNORMAL LOW (ref 3.87–5.11)
RDW: 15.5 % (ref 11.5–15.5)
WBC: 3 10*3/uL — ABNORMAL LOW (ref 4.0–10.5)
nRBC: 0 % (ref 0.0–0.2)

## 2023-08-20 LAB — COMPREHENSIVE METABOLIC PANEL
ALT: 21 U/L (ref 0–44)
AST: 38 U/L (ref 15–41)
Albumin: 2.5 g/dL — ABNORMAL LOW (ref 3.5–5.0)
Alkaline Phosphatase: 80 U/L (ref 38–126)
Anion gap: 9 (ref 5–15)
BUN: 17 mg/dL (ref 8–23)
CO2: 29 mmol/L (ref 22–32)
Calcium: 8.3 mg/dL — ABNORMAL LOW (ref 8.9–10.3)
Chloride: 98 mmol/L (ref 98–111)
Creatinine, Ser: 1.04 mg/dL — ABNORMAL HIGH (ref 0.44–1.00)
GFR, Estimated: 58 mL/min — ABNORMAL LOW (ref >60)
Glucose, Bld: 173 mg/dL — ABNORMAL HIGH (ref 70–99)
Potassium: 3.9 mmol/L (ref 3.5–5.1)
Sodium: 136 mmol/L (ref 135–145)
Total Bilirubin: 1.6 mg/dL — ABNORMAL HIGH (ref 0.3–1.2)
Total Protein: 5.3 g/dL — ABNORMAL LOW (ref 6.5–8.1)

## 2023-08-20 LAB — GLUCOSE, CAPILLARY
Glucose-Capillary: 178 mg/dL — ABNORMAL HIGH (ref 70–99)
Glucose-Capillary: 155 mg/dL — ABNORMAL HIGH (ref 70–99)
Glucose-Capillary: 146 mg/dL — ABNORMAL HIGH (ref 70–99)
Glucose-Capillary: 164 mg/dL — ABNORMAL HIGH (ref 70–99)
Glucose-Capillary: 179 mg/dL — ABNORMAL HIGH (ref 70–99)
Glucose-Capillary: 176 mg/dL — ABNORMAL HIGH (ref 70–99)

## 2023-08-20 LAB — MAGNESIUM: Magnesium: 1.6 mg/dL — ABNORMAL LOW (ref 1.7–2.4)

## 2023-08-20 MED ORDER — LACTULOSE 10 GM/15ML PO SOLN
10.0000 g | Freq: Three times a day (TID) | ORAL | Status: DC
  Administered 2023-08-20 – 2023-08-22 (×6): 10 g via ORAL
  Filled 2023-08-20 (×6): qty 15

## 2023-08-20 MED ORDER — MAGNESIUM SULFATE 2 GM/50ML IV SOLN
2.0000 g | Freq: Once | INTRAVENOUS | Status: AC
  Administered 2023-08-20: 2 g via INTRAVENOUS
  Filled 2023-08-20: qty 50

## 2023-08-20 NOTE — Progress Notes (Signed)
PROGRESS NOTE  Ariel Arnold EXB:[id number] DOB: 08/06/1956 DOA: 08/07/2023 PCP: Joaquin Courts, DO   LOS: 13 days   Brief Narrative / Interim history: Ariel Arnold is a pleasant 67 y.o. female with medical history significant for NASH cirrhosis with ascites, insulin-dependent diabetes mellitus, depression, anxiety, and right heel ulcer who presented to [facility] on 08/01/2023 with shortness of breath. Patient had first paracentesis approximately a week prior to this presentation.   [facility] Course: Upon arrival to the ED, patient was found to have large right pleural effusion and a new supplemental oxygen requirement.  She was admitted to the hospitalist service and underwent therapeutic thoracenteses on 08/02/2023 and again on 08/04/2023.  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  Dr. Archer Asa of IR agreed to see the patient in consultation for consideration of TIPS.  She was transferred to [facility] under hospitalist for IR evaluation.   She underwent TIPS on procedure on 8/21 with postprocedure hypotension and respiratory distress requiring transfer to ICU for vasopressors.  She was transferred back to [facility] on 08/13/2023. She was on RA on 8/25, but back on 4 lit of [place] oxygen. CXR showing pulmonary edema with right sided pleural effusion.  Underwent thoracentesis, paracentesis as well as TIPS revision on 8/28  Subjective / 24h Interval events: Denies any shortness of breath.  Denies any abdominal distention.  Assesement and Plan: Principal Problem:   Decompensated hepatic cirrhosis (HCC) Active Problems:   Acute respiratory failure with hypoxia (HCC)   Recurrent pleural effusion on right   Type 2 diabetes mellitus with diabetic neuropathy, unspecified (HCC)   Thrombocytopenia (HCC)   Essential hypertension   CKD stage G3a/A3, GFR 45-59 and albumin creatinine ratio >300 mg/g (HCC)   Major depressive disorder, single episode, unspecified    Hypomagnesemia   History of peptic ulcer disease   Mixed hyperlipidemia   Pressure injury of skin   Acute pulmonary edema (HCC)   Pleural effusion on right   Principal problem Acute hypoxic respiratory failure secondary to recurrent pleural effusion due to hepatic hydrothorax and pulmonary edema - S/p thoracentesis x 3, on 8/14, 8/16 and 8/21, also underwent TIPS on 8/21 -Dyspnea improved, she was off oxygen and currently stable on room air -Lasix, spironolactone.  Continue IV Lasix for another day, probably convert Lasix to oral in the next day or so, discussed with GI will do 40 mg p.o. twice daily as well as 100 mg of spironolactone -IR consulted, status post TIPS revision 8/28.  Repeat chest x-ray this morning showing new small right-sided pleural effusion, images reviewed by me and looks stable   Active problems NASH cirrhosis - S/p TIPS on 08/09/2023 with postprocedure hypotension and respiratory distress. Continue with lactulose and rifaximin, decrease lactulose dose today due to severe diarrhea   Stage IIIa CKD -creat stable, continue to monitor   Hypokalemia, hypomagnesemia-stable, monitor  Anemia of chronic disease - From cirrhosis.  Globin stable in the 7 range.  Chronic thrombocytopenia - From cirrhosis, continue to monitor.  Overall stable   Depression/anxiety - Continue with sertraline.   Hyperlipidemia - Continue with Pravachol   In view of poor progression, deconditioning palliative care consulted, patient now considering hospice   Lab Results  Component Value Date   HGBA1C 5.0 08/08/2023   CBG (last 3)  Recent Labs    08/20/23 0140 08/20/23 0358 08/20/23 0746  GLUCAP 178* 155* 146*    Scheduled Meds:  Chlorhexidine Gluconate Cloth  6 each Topical Daily  diclofenac Sodium  4 g Topical QID   enoxaparin (LOVENOX) injection  40 mg Subcutaneous Q24H   folic acid  1 mg Oral BID   furosemide  40 mg Intravenous Q12H   Gerhardt's butt cream   Topical BID    insulin aspart  0-15 Units Subcutaneous TID WC   insulin aspart  0-5 Units Subcutaneous QHS   insulin glargine-yfgn  20 Units Subcutaneous QHS   lactulose  10 g Oral TID   midodrine  5 mg Oral TID WC   mouth rinse  15 mL Mouth Rinse 4 times per day   pantoprazole  40 mg Oral Daily   pravastatin  20 mg Oral QHS   rifaximin  550 mg Oral BID   sertraline  150 mg Oral Daily   spironolactone  25 mg Oral Daily   Continuous Infusions:  sodium chloride     PRN Meds:.albuterol, gabapentin, hydrOXYzine, iohexol, ondansetron **OR** ondansetron (ZOFRAN) IV, mouth rinse, oxyCODONE  Current Outpatient Medications  Medication Instructions   albuterol (VENTOLIN HFA) 108 (90 Base) MCG/ACT inhaler 2 puffs, Inhalation, 2 times daily PRN   ascorbic acid (VITAMIN C) 500 mg, Oral, 2 times daily   Calcium Carbonate-Vit D-Min (CALCIUM 1200) 1200-1000 MG-UNIT CHEW 2 tablets, Oral, Daily   carvedilol (COREG) 6.25 mg, Oral, 2 times daily   clotrimazole-betamethasone (LOTRISONE) cream 1 Application, Topical, Daily PRN   diclofenac Sodium (VOLTAREN) 2 g, Topical, 2 times daily   ferrous sulfate 325 mg, Oral, 2 times daily   folic acid (FOLVITE) 1 mg, Oral, 2 times daily   furosemide (LASIX) 20 mg, Oral, Weekly   gabapentin (NEURONTIN) 600 mg, Oral, 3 times daily PRN   hydrOXYzine (ATARAX) 25 mg, Oral, Daily   insulin aspart (NOVOLOG) 100 UNIT/ML injection 0-20 Units, Subcutaneous, 3 times daily with meal CBG < 70: Implement Hypoglycemia measures CBG 70 - 120: 0 units CBG 121 - 150: 3 units CBG 151 - 200: 4 units CBG 201 - 250: 7 units CBG 251 - 300: 11 units CBG 301 - 350: 15 units CBG 351 - 400: 20 units CBG > 400: call MD   isosorbide mononitrate (IMDUR) 30 MG 24 hr tablet Oral   ketorolac (ACULAR) 0.5 % ophthalmic solution 1 drop, Both Eyes, 4 times daily PRN   lactulose (CHRONULAC) 10 g, Oral, 2 times daily, for ammonia levels   oxyCODONE (OXY IR/ROXICODONE) 5 mg, Oral, Every 6 hours PRN   pantoprazole  (PROTONIX) 40 mg, Oral, Daily PRN   pravastatin (PRAVACHOL) 20 mg, Oral, Daily at bedtime, Take after completion of daptomycin   rifaximin (XIFAXAN) 550 mg, Oral, 2 times daily   sertraline (ZOLOFT) 200 mg, Oral, Daily   sodium hypochlorite (DAKIN'S 1/2 STRENGTH) external solution 1 Application, Irrigation, Daily   spironolactone (ALDACTONE) 50 mg, Oral, Daily PRN   Toujeo Max SoloStar 20 Units, Subcutaneous, Daily at bedtime   Vitamin D, Ergocalciferol, (DRISDOL) 1.25 MG (50000 UNIT) CAPS capsule 1 capsule, Oral, Every 7 days, Mondays    Diet Orders (From admission, onward)     Start     Ordered   08/16/23 1555  Diet regular Room service appropriate? Yes; Fluid consistency: Thin  Diet effective now       Question Answer Comment  Room service appropriate? Yes   Fluid consistency: Thin      08/16/23 1555            DVT prophylaxis: enoxaparin (LOVENOX) injection 40 mg Start: 08/10/23 1015 SCDs Start: 08/07/23 1524  Lab Results  Component Value Date   PLT 71 (L) 08/20/2023      Code Status: DNR  Family Communication: no family at bedside   Status is: Inpatient  Remains inpatient appropriate because: severity of illness  Level of care: Med-Surg  Consultants:  IR GI PCCM Palliative  Objective: Vitals:   08/19/23 1626 08/19/23 2131 08/20/23 0523 08/20/23 0746  BP: (!) 105/43 (!) 90/33 (!) 105/43 (!) 103/45  Pulse: 78 75 80 77  Resp: 16 18 16 16   Temp: (!) 97.4 F (36.3 C) 98.2 F (36.8 C) 97.6 F (36.4 C) 98.4 F (36.9 C)  TempSrc: Oral Oral  Oral  SpO2: 95% 95% 92% 96%  Weight:      Height:        Intake/Output Summary (Last 24 hours) at 08/20/2023 1136 Last data filed at 08/20/2023 0400 Gross per 24 hour  Intake 240 ml  Output 2150 ml  Net -1910 ml   Wt Readings from Last 3 Encounters:  08/19/23 75.5 kg  08/21/22 86.7 kg  06/02/22 85.6 kg    Examination:  Constitutional: NAD Eyes: lids and conjunctivae normal, no scleral icterus ENMT:  mmm Neck: normal, supple Respiratory: clear to auscultation bilaterally, no wheezing, no crackles.  Cardiovascular: Regular rate and rhythm, no murmurs / rubs / gallops. No LE edema. Abdomen: soft, no distention, no tenderness. Bowel sounds positive.   Data Reviewed: I have independently reviewed following labs and imaging studies   CBC Recent Labs  Lab 08/14/23 0416 08/15/23 0319 08/16/23 1230 08/17/23 1543 08/18/23 0934 08/19/23 1001 08/20/23 0247  WBC 3.2* 3.3* 3.1* 6.5 2.6* 2.4* 3.0*  HGB 7.4* 8.2* 8.2* 8.6* 7.8* 7.9* 7.5*  HCT 22.7* 25.4* 25.4* 26.3* 23.5* 24.6* 23.1*  PLT 60* 63* 65* 92* 60* 68* 71*  MCV 97.0 99.2 99.2 95.6 97.5 99.6 95.5  MCH 31.6 32.0 32.0 31.3 32.4 32.0 31.0  MCHC 32.6 32.3 32.3 32.7 33.2 32.1 32.5  RDW 15.0 15.3 15.4 15.9* 15.4 15.5 15.5  LYMPHSABS 1.0 1.0  --   --   --   --   --   MONOABS 0.3 0.3  --   --   --   --   --   EOSABS 0.3 0.3  --   --   --   --   --   BASOSABS 0.0 0.0  --   --   --   --   --     Recent Labs  Lab 08/15/23 0319 08/16/23 1230 08/17/23 1543 08/18/23 0934 08/19/23 1001 08/20/23 0247  NA 139 140 136 136 133* 136  K 4.0 3.6 4.0 3.7 4.5 3.9  CL 106 105 104 98 99 98  CO2 26 30 26 30 29 29   GLUCOSE 165* 96 159* 191* 228* 173*  BUN 28* 21 17 17 19 17   CREATININE 1.04* 0.94 0.90 1.03* 1.03* 1.04*  CALCIUM 8.0* 8.1* 7.8* 8.3* 7.7* 8.3*  AST  --  30 39 30 29 38  ALT  --  17 19 17 19 21   ALKPHOS  --  70 92 71 72 80  BILITOT  --  1.0 1.7* 1.9* 1.9* 1.6*  ALBUMIN  --  2.6* 2.6* 2.9* 2.7* 2.5*  MG  --   --  1.5* 1.5* 1.7 1.6*  INR  --  1.8*  --   --   --   --   BNP 353.4*  --   --   --   --   --     ------------------------------------------------------------------------------------------------------------------  No results for input(s): "CHOL", "HDL", "LDLCALC", "TRIG", "CHOLHDL", "LDLDIRECT" in the last 72 hours.  Lab Results  Component Value Date   HGBA1C 5.0 08/08/2023    ------------------------------------------------------------------------------------------------------------------ No results for input(s): "TSH", "T4TOTAL", "T3FREE", "THYROIDAB" in the last 72 hours.  Invalid input(s): "FREET3"  Cardiac Enzymes No results for input(s): "CKMB", "TROPONINI", "MYOGLOBIN" in the last 168 hours.  Invalid input(s): "CK" ------------------------------------------------------------------------------------------------------------------    Component Value Date/Time   BNP 353.4 (H) 08/15/2023 0319    CBG: Recent Labs  Lab 08/19/23 1625 08/19/23 2129 08/20/23 0140 08/20/23 0358 08/20/23 0746  GLUCAP 119* 214* 178* 155* 146*    No results found for this or any previous visit (from the past 240 hour(s)).    Radiology Studies: DG Chest 2 View  Result Date: 08/20/2023 CLINICAL DATA:  [id number] Dyspnea [id number] EXAM: CHEST - 2 VIEW COMPARISON:  08/18/2023. FINDINGS: Cardiac silhouette is prominent. There is pulmonary interstitial prominence with vascular congestion. Consolidation or volume loss in the right base may represent atelectasis. No pneumothorax. There is right-sided pleural effusion. IMPRESSION: Findings suggest CHF. Right basilar pleural effusion and atelectatic changes. Electronically Signed   By: Layla Maw M.D.   On: 08/20/2023 09:44     Pamella Pert, MD, PhD [facility]  Between 7 am - 7 pm I am available, please contact me via Amion (for emergencies) or Securechat (non urgent messages)  Between 7 pm - 7 am I am not available, please contact night coverage MD/APP via Amion

## 2023-08-21 LAB — CBC
HCT: 23.6 % — ABNORMAL LOW (ref 36.0–46.0)
Hemoglobin: 7.5 g/dL — ABNORMAL LOW (ref 12.0–15.0)
MCH: 30.6 pg (ref 26.0–34.0)
MCHC: 31.8 g/dL (ref 30.0–36.0)
MCV: 96.3 fL (ref 80.0–100.0)
Platelets: 74 10*3/uL — ABNORMAL LOW (ref 150–400)
RBC: 2.45 MIL/uL — ABNORMAL LOW (ref 3.87–5.11)
RDW: 15.7 % — ABNORMAL HIGH (ref 11.5–15.5)
WBC: 2.7 10*3/uL — ABNORMAL LOW (ref 4.0–10.5)
nRBC: 0 % (ref 0.0–0.2)

## 2023-08-21 LAB — COMPREHENSIVE METABOLIC PANEL
ALT: 19 U/L (ref 0–44)
AST: 34 U/L (ref 15–41)
Albumin: 2.5 g/dL — ABNORMAL LOW (ref 3.5–5.0)
Alkaline Phosphatase: 78 U/L (ref 38–126)
Anion gap: 7 (ref 5–15)
BUN: 18 mg/dL (ref 8–23)
CO2: 28 mmol/L (ref 22–32)
Calcium: 8 mg/dL — ABNORMAL LOW (ref 8.9–10.3)
Chloride: 99 mmol/L (ref 98–111)
Creatinine, Ser: 1.09 mg/dL — ABNORMAL HIGH (ref 0.44–1.00)
GFR, Estimated: 55 mL/min — ABNORMAL LOW (ref >60)
Glucose, Bld: 144 mg/dL — ABNORMAL HIGH (ref 70–99)
Potassium: 4 mmol/L (ref 3.5–5.1)
Sodium: 134 mmol/L — ABNORMAL LOW (ref 135–145)
Total Bilirubin: 1.6 mg/dL — ABNORMAL HIGH (ref 0.3–1.2)
Total Protein: 5.3 g/dL — ABNORMAL LOW (ref 6.5–8.1)

## 2023-08-21 LAB — GLUCOSE, CAPILLARY
Glucose-Capillary: 121 mg/dL — ABNORMAL HIGH (ref 70–99)
Glucose-Capillary: 140 mg/dL — ABNORMAL HIGH (ref 70–99)
Glucose-Capillary: 123 mg/dL — ABNORMAL HIGH (ref 70–99)
Glucose-Capillary: 143 mg/dL — ABNORMAL HIGH (ref 70–99)

## 2023-08-21 LAB — MAGNESIUM: Magnesium: 1.8 mg/dL (ref 1.7–2.4)

## 2023-08-21 MED ORDER — SPIRONOLACTONE 100 MG PO TABS
100.0000 mg | ORAL_TABLET | Freq: Every day | ORAL | Status: DC
  Administered 2023-08-22: 100 mg via ORAL
  Filled 2023-08-21: qty 1

## 2023-08-21 MED ORDER — FUROSEMIDE 40 MG PO TABS
40.0000 mg | ORAL_TABLET | Freq: Two times a day (BID) | ORAL | Status: DC
  Administered 2023-08-21 – 2023-08-22 (×2): 40 mg via ORAL
  Filled 2023-08-21 (×2): qty 1

## 2023-08-21 MED ORDER — SPIRONOLACTONE 25 MG PO TABS
75.0000 mg | ORAL_TABLET | Freq: Once | ORAL | Status: AC
  Administered 2023-08-21: 75 mg via ORAL
  Filled 2023-08-21: qty 3

## 2023-08-21 NOTE — TOC Progression Note (Addendum)
Transition of Care [facility]) - Progression Note    Patient Details  Name: Ariel Arnold MRN: [id number] Date of Birth: Feb 03, 1956  Transition of Care [facility]) CM/SW Contact  Epifanio Lesches, RN Phone Number: 08/21/2023, 11:04 AM  Clinical Narrative:    Pt would like to transition to home with hospice care @ d/c. States daughter Darl Pikes will assist with care once d/c. NCM offered choice regarding home hospice provider. Pt selected [facility] in [place], [place]. Referral made with Jennifer/ [phone]) for home hospice care. Requested clinicals faxed to [phone], approval pending. TOC team following and will assist with needs....  08/21/2023 @ 1300 Clinical emailed to [email or web address]  Expected Discharge Plan: Home w Hospice Care Barriers to Discharge: Continued Medical Work up  Expected Discharge Plan and Services   Discharge Planning Services: CM Consult Post Acute Care Choice: Home Health Living arrangements for the past 2 months: Apartment                 DME Arranged:  (see note)         HH Arranged: RN, PT           Social Determinants of Health (SDOH) Interventions SDOH Screenings   Food Insecurity: No Food Insecurity (08/07/2023)  Recent Concern: Food Insecurity - Food Insecurity Present (08/03/2023)   Received from [facility]  Housing: Low Risk  (08/07/2023)  Transportation Needs: No Transportation Needs (08/07/2023)  Recent Concern: Transportation Needs - Unmet Transportation Needs (08/03/2023)   Received from [facility]  Utilities: Not At Risk (08/07/2023)  Financial Resource Strain: Medium Risk (08/03/2023)   Received from [facility]  Physical Activity: Inactive (07/16/2020)   Received from [facility], [facility]  Social Connections: Socially Isolated (08/03/2023)   Received from [facility]  Stress: No Stress Concern Present (06/11/2022)   Received from [facility], [facility]   Tobacco Use: Medium Risk (08/09/2023)  Health Literacy: Medium Risk (08/03/2023)   Received from [facility]    Readmission Risk Interventions    11/16/2021    9:27 AM  Readmission Risk Prevention Plan  Home Care Screening Complete  Medication Review (RN CM) Complete

## 2023-08-21 NOTE — Progress Notes (Signed)
Physical Therapy Treatment Patient Details Name: Ariel Arnold MRN: [id number] DOB: 1956/10/19 Today's Date: 08/21/2023   History of Present Illness 67 yo female admitted to [facility] 8/13 with SOB with Rt pleural effusion. 8/14 & 8/16 thoracentesis. 8/19 transfer to [facility]. 8/21 TIPS procedure with post procedure hypotension. Plan for thoracentesis 8/23. s/p 8/28 thoracentesis and TIPS revision. PMHx; NASH cirrhosis with ascites, T2DM, HTN, PVD, osteomyelitis, depression, anxiety, Lt BKA    PT Comments  Continuing work on functional mobility and activity tolerance;  Session focused on functional transfers, and pt was able to trasnfer sit>stand, and use stedy for OOB to recliner transfer; Used L prosthesis today, but notable that she has a poor fit - prosthesis is way too big, and putting pressure on distal limb instead of more pressure tolerant areas; will need consistent follow up with prosthetist for fitting; using her prosthesis for transfers will make the transitions better and safer with better and wider base of support for pivots -- and of course, she can work towards taking steps if that's a part of her goals;   PT has been recommending sNF for rehab, and pt is still appropriate for rehab, however pt and family are choosing to go home with hospice care; recommend at or near 24 hour assist at home, drop arm bSC, sliding board, and possibly tub transfer bench (but will defer to OT for bathroom DME)   If plan is discharge home, recommend the following: A lot of help with walking and/or transfers;A lot of help with bathing/dressing/bathroom;Assistance with cooking/housework;Assist for transportation   Can travel by private vehicle     No  Equipment Recommendations  Other (comment) (drop-arm BSC, consider tub transfer bench; consider sliding board)    Recommendations for Other Services  Often Hospice Agency decides re: possibility of HHPT/OT -- worht considering these services for family  ADL and mobilty training at home     Precautions / Restrictions Precautions Precautions: Fall;Other (comment) Precaution Comments: Lt BKA Restrictions Weight Bearing Restrictions: Yes LLE Weight Bearing: Non weight bearing Other Position/Activity Restrictions: hx of L BKA     Mobility  Bed Mobility Overal bed mobility: Needs Assistance Bed Mobility: Supine to Sit     Supine to sit: HOB elevated, Used rails, Min assist     General bed mobility comments: min assist to scoot forward    Transfers Overall transfer level: Needs assistance Equipment used: Ambulation equipment used Transfers: Sit to/from Stand Sit to Stand: Mod assist           General transfer comment: Used stedy for more support in standing; mod assist to power up to standing; pt able to stay standing as pt rolled in stedy over to recliner Transfer via Lift Equipment: Stedy  Ambulation/Gait                   Stairs             Wheelchair Mobility     Tilt Bed    Modified Rankin (Stroke Patients Only)       Balance     Sitting balance-Leahy Scale: Fair       Standing balance-Leahy Scale: Poor Standing balance comment: reliant on BUE support                            Cognition Arousal: Alert Behavior During Therapy: WFL for tasks assessed/performed Overall Cognitive Status: Impaired/Different from baseline  Problem Solving: Slow processing          Exercises      General Comments General comments (skin integrity, edema, etc.): session conducted on room air, NAD      Pertinent Vitals/Pain Pain Assessment Pain Assessment: Faces Faces Pain Scale: Hurts even more Pain Location: LLE at distal limb in prosthesis that is too big Pain Descriptors / Indicators: Pressure Pain Intervention(s): Monitored during session, Other (comment) (attempted to pad prosthesis; Asked for daughter to bring in all the socks  available)    Home Living                          Prior Function            PT Goals (current goals can now be found in the care plan section) Acute Rehab PT Goals Patient Stated Goal: return home PT Goal Formulation: With patient Time For Goal Achievement: 08/24/23 Potential to Achieve Goals: Fair Progress towards PT goals: Progressing toward goals    Frequency    Min 1X/week      PT Plan      Co-evaluation              AM-PAC PT "6 Clicks" Mobility   Outcome Measure  Help needed turning from your back to your side while in a flat bed without using bedrails?: A Little Help needed moving from lying on your back to sitting on the side of a flat bed without using bedrails?: A Little Help needed moving to and from a bed to a chair (including a wheelchair)?: A Lot Help needed standing up from a chair using your arms (e.g., wheelchair or bedside chair)?: A Lot Help needed to walk in hospital room?: Total Help needed climbing 3-5 steps with a railing? : Total 6 Click Score: 12    End of Session Equipment Utilized During Treatment: Gait belt;Other (comment) Antony Salmon) Activity Tolerance: Patient tolerated treatment well Patient left: in chair;with call bell/phone within reach;with chair alarm set Nurse Communication: Mobility status PT Visit Diagnosis: Other abnormalities of gait and mobility (R26.89);Muscle weakness (generalized) (M62.81)     Time: 4540-9811 PT Time Calculation (min) (ACUTE ONLY): 20 min  Charges:    $Therapeutic Activity: 8-22 mins PT General Charges $$ ACUTE PT VISIT: 1 Visit                     Van Clines, PT  Acute Rehabilitation Services Office [phone] Secure Chat welcomed    Ariel Arnold 08/21/2023, 4:18 PM

## 2023-08-21 NOTE — Progress Notes (Incomplete)
Physical Therapy Treatment Patient Details Name: Ariel Arnold MRN: [id number] DOB: 11-29-1956 Today's Date: 08/21/2023   History of Present Illness 67 yo female admitted to [facility] 8/13 with SOB with Rt pleural effusion. 8/14 & 8/16 thoracentesis. 8/19 transfer to [facility]. 8/21 TIPS procedure with post procedure hypotension. Plan for thoracentesis 8/23. s/p 8/28 thoracentesis and TIPS revision. PMHx; NASH cirrhosis with ascites, T2DM, HTN, PVD, osteomyelitis, depression, anxiety, Lt BKA    PT Comments  ***    If plan is discharge home, recommend the following:     Can travel by private vehicle        Equipment Recommendations       Recommendations for Other Services       Precautions / Restrictions Precautions Precautions: Fall;Other (comment) Precaution Comments: Lt BKA Restrictions Weight Bearing Restrictions: Yes LLE Weight Bearing: Non weight bearing Other Position/Activity Restrictions: hx of L BKA     Mobility  Bed Mobility                    Transfers                        Ambulation/Gait                   Stairs             Wheelchair Mobility     Tilt Bed    Modified Rankin (Stroke Patients Only)       Balance                                            Cognition                                                Exercises      General Comments        Pertinent Vitals/Pain      Home Living                          Prior Function            PT Goals (current goals can now be found in the care plan section)      Frequency           PT Plan      Co-evaluation              AM-PAC PT "6 Clicks" Mobility   Outcome Measure                   End of Session               Time: 4098-1191 PT Time Calculation (min) (ACUTE ONLY): 20 min  Charges:    $Therapeutic Activity: 8-22 mins PT General Charges $$ ACUTE PT  VISIT: 1 Visit                     {enter signature dotphrase here}   Levi Aland 08/21/2023, 3:11 PM

## 2023-08-21 NOTE — Progress Notes (Signed)
PROGRESS NOTE  TENEA STAY NFA:[id number] DOB: [birth year] DOA: 08/07/2023 PCP: Joaquin Courts, DO   LOS: 14 days   Brief Narrative / Interim history: Ariel Arnold is a pleasant 67 y.o. female with medical history significant for NASH cirrhosis with ascites, insulin-dependent diabetes mellitus, depression, anxiety, and right heel ulcer who presented to [facility] on 08/01/2023 with shortness of breath. Patient had first paracentesis approximately a week prior to this presentation.   [facility] Hospital Course: Upon arrival to the ED, patient was found to have large right pleural effusion and a new supplemental oxygen requirement.  She was admitted to the hospitalist service and underwent therapeutic thoracenteses on 08/02/2023 and again on 08/04/2023.  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  Dr. Archer Asa of IR agreed to see the patient in consultation for consideration of TIPS.  She was transferred to [facility] under hospitalist for IR evaluation.   She underwent TIPS on procedure on 8/21 with postprocedure hypotension and respiratory distress requiring transfer to ICU for vasopressors.  She was transferred back to [facility] on 08/13/2023. She was on RA on 8/25, but back on 4 lit of [place] oxygen. CXR showing pulmonary edema with right sided pleural effusion.  Underwent thoracentesis, paracentesis as well as TIPS revision on 8/28  Subjective / 24h Interval events: Doing well, feeling good.  No shortness of breath.  Does not feel like her belly is increasing again  Assesement and Plan: Principal Problem:   Decompensated hepatic cirrhosis (HCC) Active Problems:   Acute respiratory failure with hypoxia (HCC)   Recurrent pleural effusion on right   Type 2 diabetes mellitus with diabetic neuropathy, unspecified (HCC)   Thrombocytopenia (HCC)   Essential hypertension   CKD stage G3a/A3, GFR 45-59 and albumin creatinine ratio >300 mg/g (HCC)   Major depressive disorder, single  episode, unspecified   Hypomagnesemia   History of peptic ulcer disease   Mixed hyperlipidemia   Pressure injury of skin   Acute pulmonary edema (HCC)   Pleural effusion on right   Principal problem Acute hypoxic respiratory failure secondary to recurrent pleural effusion due to hepatic hydrothorax and pulmonary edema - S/p thoracentesis x 3, on 8/14, 8/16 and 8/21, also underwent TIPS on 8/21 -Dyspnea improved, she was off oxygen and currently stable on room air -Lasix, spironolactone.  Change to p.o. Lasix, increase spironolactone to 100 mg daily.  This was discussed with GI few days ago. -IR consulted, status post TIPS revision 8/28.  Repeat chest x-ray 9/1 showed stable small pleural effusion, she is on room air   Active problems NASH cirrhosis - S/p TIPS on 08/09/2023 with postprocedure hypotension and respiratory distress. Continue with lactulose and rifaximin   Stage IIIa CKD -creatinine stable today   Hypokalemia, hypomagnesemia-stable, monitor  Anemia of chronic disease - From cirrhosis.  Hemoglobin stable in the 7 range.  Chronic thrombocytopenia - From cirrhosis, continue to monitor.  Overall stable   Depression/anxiety - Continue with sertraline.   Hyperlipidemia - Continue with Pravachol   In view of poor progression, deconditioning palliative care consulted, patient now considering hospice   Lab Results  Component Value Date   HGBA1C 5.0 08/08/2023   CBG (last 3)  Recent Labs    08/20/23 1731 08/20/23 2035 08/21/23 0809  GLUCAP 179* 176* 121*    Scheduled Meds:  Chlorhexidine Gluconate Cloth  6 each Topical Daily   diclofenac Sodium  4 g Topical QID   enoxaparin (LOVENOX) injection  40 mg Subcutaneous Q24H  folic acid  1 mg Oral BID   furosemide  40 mg Oral BID   Gerhardt's butt cream   Topical BID   insulin aspart  0-15 Units Subcutaneous TID WC   insulin aspart  0-5 Units Subcutaneous QHS   insulin glargine-yfgn  20 Units Subcutaneous QHS    lactulose  10 g Oral TID   midodrine  5 mg Oral TID WC   mouth rinse  15 mL Mouth Rinse 4 times per day   pantoprazole  40 mg Oral Daily   pravastatin  20 mg Oral QHS   rifaximin  550 mg Oral BID   sertraline  150 mg Oral Daily   [START ON 08/22/2023] spironolactone  100 mg Oral Daily   spironolactone  75 mg Oral Once   Continuous Infusions:  sodium chloride     PRN Meds:.albuterol, gabapentin, hydrOXYzine, iohexol, ondansetron **OR** ondansetron (ZOFRAN) IV, mouth rinse, oxyCODONE  Current Outpatient Medications  Medication Instructions   albuterol (VENTOLIN HFA) 108 (90 Base) MCG/ACT inhaler 2 puffs, Inhalation, 2 times daily PRN   ascorbic acid (VITAMIN C) 500 mg, Oral, 2 times daily   Calcium Carbonate-Vit D-Min (CALCIUM 1200) 1200-1000 MG-UNIT CHEW 2 tablets, Oral, Daily   carvedilol (COREG) 6.25 mg, Oral, 2 times daily   clotrimazole-betamethasone (LOTRISONE) cream 1 Application, Topical, Daily PRN   diclofenac Sodium (VOLTAREN) 2 g, Topical, 2 times daily   ferrous sulfate 325 mg, Oral, 2 times daily   folic acid (FOLVITE) 1 mg, Oral, 2 times daily   furosemide (LASIX) 20 mg, Oral, Weekly   gabapentin (NEURONTIN) 600 mg, Oral, 3 times daily PRN   hydrOXYzine (ATARAX) 25 mg, Oral, Daily   insulin aspart (NOVOLOG) 100 UNIT/ML injection 0-20 Units, Subcutaneous, 3 times daily with meal CBG < 70: Implement Hypoglycemia measures CBG 70 - 120: 0 units CBG 121 - 150: 3 units CBG 151 - 200: 4 units CBG 201 - 250: 7 units CBG 251 - 300: 11 units CBG 301 - 350: 15 units CBG 351 - 400: 20 units CBG > 400: call MD   isosorbide mononitrate (IMDUR) 30 MG 24 hr tablet Oral   ketorolac (ACULAR) 0.5 % ophthalmic solution 1 drop, Both Eyes, 4 times daily PRN   lactulose (CHRONULAC) 10 g, Oral, 2 times daily, for ammonia levels   oxyCODONE (OXY IR/ROXICODONE) 5 mg, Oral, Every 6 hours PRN   pantoprazole (PROTONIX) 40 mg, Oral, Daily PRN   pravastatin (PRAVACHOL) 20 mg, Oral, Daily at bedtime,  Take after completion of daptomycin   rifaximin (XIFAXAN) 550 mg, Oral, 2 times daily   sertraline (ZOLOFT) 200 mg, Oral, Daily   sodium hypochlorite (DAKIN'S 1/2 STRENGTH) external solution 1 Application, Irrigation, Daily   spironolactone (ALDACTONE) 50 mg, Oral, Daily PRN   Toujeo Max SoloStar 20 Units, Subcutaneous, Daily at bedtime   Vitamin D, Ergocalciferol, (DRISDOL) 1.25 MG (50000 UNIT) CAPS capsule 1 capsule, Oral, Every 7 days, Mondays    Diet Orders (From admission, onward)     Start     Ordered   08/16/23 1555  Diet regular Room service appropriate? Yes; Fluid consistency: Thin  Diet effective now       Question Answer Comment  Room service appropriate? Yes   Fluid consistency: Thin      08/16/23 1555            DVT prophylaxis: enoxaparin (LOVENOX) injection 40 mg Start: 08/10/23 1015 SCDs Start: 08/07/23 1524   Lab Results  Component Value Date  PLT 74 (L) 08/21/2023      Code Status: DNR  Family Communication: no family at bedside   Status is: Inpatient  Remains inpatient appropriate because: severity of illness  Level of care: Med-Surg  Consultants:  IR GI PCCM Palliative  Objective: Vitals:   08/20/23 0746 08/20/23 2033 08/21/23 0521 08/21/23 0813  BP: (!) 103/45 (!) 102/31 (!) 105/42 (!) 106/53  Pulse: 77 83 78 75  Resp: 16 20 18 17   Temp: 98.4 F (36.9 C)  98.5 F (36.9 C) 98.3 F (36.8 C)  TempSrc: Oral Oral Oral Oral  SpO2: 96% 94% 96% 95%  Weight:   71 kg   Height:        Intake/Output Summary (Last 24 hours) at 08/21/2023 1015 Last data filed at 08/20/2023 2247 Gross per 24 hour  Intake 240 ml  Output 2150 ml  Net -1910 ml   Wt Readings from Last 3 Encounters:  08/21/23 71 kg  08/21/22 86.7 kg  06/02/22 85.6 kg    Examination:  Constitutional: NAD Eyes: lids and conjunctivae normal, no scleral icterus ENMT: mmm Neck: normal, supple Respiratory: clear to auscultation bilaterally, no wheezing, no crackles.   Cardiovascular: Regular rate and rhythm, no murmurs / rubs / gallops. No LE edema. Abdomen: soft, no distention, no tenderness. Bowel sounds positive.   Data Reviewed: I have independently reviewed following labs and imaging studies   CBC Recent Labs  Lab 08/15/23 0319 08/16/23 1230 08/17/23 1543 08/18/23 0934 08/19/23 1001 08/20/23 0247 08/21/23 0708  WBC 3.3*   < > 6.5 2.6* 2.4* 3.0* 2.7*  HGB 8.2*   < > 8.6* 7.8* 7.9* 7.5* 7.5*  HCT 25.4*   < > 26.3* 23.5* 24.6* 23.1* 23.6*  PLT 63*   < > 92* 60* 68* 71* 74*  MCV 99.2   < > 95.6 97.5 99.6 95.5 96.3  MCH 32.0   < > 31.3 32.4 32.0 31.0 30.6  MCHC 32.3   < > 32.7 33.2 32.1 32.5 31.8  RDW 15.3   < > 15.9* 15.4 15.5 15.5 15.7*  LYMPHSABS 1.0  --   --   --   --   --   --   MONOABS 0.3  --   --   --   --   --   --   EOSABS 0.3  --   --   --   --   --   --   BASOSABS 0.0  --   --   --   --   --   --    < > = values in this interval not displayed.    Recent Labs  Lab 08/15/23 0319 08/15/23 0319 08/16/23 1230 08/17/23 1543 08/18/23 0934 08/19/23 1001 08/20/23 0247 08/21/23 0708  NA 139  --  140 136 136 133* 136 134*  K 4.0  --  3.6 4.0 3.7 4.5 3.9 4.0  CL 106  --  105 104 98 99 98 99  CO2 26  --  30 26 30 29 29 28   GLUCOSE 165*  --  96 159* 191* 228* 173* 144*  BUN 28*  --  21 17 17 19 17 18   CREATININE 1.04*  --  0.94 0.90 1.03* 1.03* 1.04* 1.09*  CALCIUM 8.0*  --  8.1* 7.8* 8.3* 7.7* 8.3* 8.0*  AST  --    < > 30 39 30 29 38 34  ALT  --    < > 17 19 17 19 21  19  ALKPHOS  --    < > 70 92 71 72 80 78  BILITOT  --    < > 1.0 1.7* 1.9* 1.9* 1.6* 1.6*  ALBUMIN  --    < > 2.6* 2.6* 2.9* 2.7* 2.5* 2.5*  MG  --   --   --  1.5* 1.5* 1.7 1.6* 1.8  INR  --   --  1.8*  --   --   --   --   --   BNP 353.4*  --   --   --   --   --   --   --    < > = values in this interval not displayed.    ------------------------------------------------------------------------------------------------------------------ No results for  input(s): "CHOL", "HDL", "LDLCALC", "TRIG", "CHOLHDL", "LDLDIRECT" in the last 72 hours.  Lab Results  Component Value Date   HGBA1C 5.0 08/08/2023   ------------------------------------------------------------------------------------------------------------------ No results for input(s): "TSH", "T4TOTAL", "T3FREE", "THYROIDAB" in the last 72 hours.  Invalid input(s): "FREET3"  Cardiac Enzymes No results for input(s): "CKMB", "TROPONINI", "MYOGLOBIN" in the last 168 hours.  Invalid input(s): "CK" ------------------------------------------------------------------------------------------------------------------    Component Value Date/Time   BNP 353.4 (H) 08/15/2023 0319    CBG: Recent Labs  Lab 08/20/23 0746 08/20/23 1149 08/20/23 1731 08/20/23 2035 08/21/23 0809  GLUCAP 146* 164* 179* 176* 121*    No results found for this or any previous visit (from the past 240 hour(s)).    Radiology Studies: No results found.   Pamella Pert, MD, PhD [facility]  Between 7 am - 7 pm I am available, please contact me via Amion (for emergencies) or Securechat (non urgent messages)  Between 7 pm - 7 am I am not available, please contact night coverage MD/APP via Amion

## 2023-08-21 NOTE — Progress Notes (Signed)
Patient ID: Ariel Arnold, female   DOB: [birth year], 67 y.o.   MRN: [id number]    Progress Note from the Palliative Medicine Team at [facility]   Patient Name: Ariel Arnold        Date: 08/21/2023 DOB: 1956-05-13  Age: 67 y.o. MRN#: [id number] Attending Physician: Leatha Gilding, MD Primary Care Physician: Joaquin Courts, DO Admit Date: 08/07/2023   Reason for Consultation/Follow-up   Establishing Goals of  Care   HPI/ Brief Hospital Review  67 y.o. female  with past medical history of NASH cirrhosis with ascites, insulin-dependent diabetes mellitus, depression, anxiety, and right heel ulcer who presented to [facility] on 08/01/2023 with shortness of breath.  Upon arrival to the ED, patient was found to have large right pleural effusion and a new supplemental oxygen requirement. She was admitted to the hospitalist service and underwent therapeutic thoracenteses on 08/02/2023 and again on 08/04/2023.  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  Dr. Archer Asa of IR agreed to see the patient in consultation for consideration of TIPS.  She was transferred to [facility] then [facility] under the hospitalist service for IR evaluation.    08/09/23 she underwent successful ICE-guided TIPS and right thoracentesis and paracentesis; however, had post op hypotension requiring pressor support and BiPap application.  8/22 she was weaned off pressors and back on RA.  8/23 with increased shortness of breath and confusion. Increased lactulose and diuresis 8/24 Mental status improving 8-25 increasing pulmonary edema 08-16-23  repeat thoracentesis for 2L fluid   Subjective  Extensive chart review has been completed prior to meeting with patient/family  including labs, vital signs, imaging, progress/consult notes, orders, medications and available advance directive documents.    This NP assessed patient at the bedside as a follow up for palliative medicine needs and  emotional support, no family at bedside.         Continued  education offered on current medical situation specific to her liver disease, DM, vascular disease and her overall failure to thrive.    Patient remains open to conservative medical management of chronic disease.  She remains hopeful for continued quality of life at home.    Education offered and we revisited  hospice benefit; philosophy and eligibility.  Today she tells me that she has made decision to accept hospice services in the home on discharge  Education offered today regarding  the importance of continued conversation with family and their  medical providers regarding overall plan of care and treatment options,  ensuring decisions are within the context of the patients values and GOCs.  Questions and concerns addressed     Time:    25  minutes  Detailed review of medical records ( labs, imaging, vital signs), medically appropriate exam ( MS, skin, cardia,  resp)   discussed with treatment team, counseling and education to patient, family, staff, documenting clinical information, medication management, coordination of care    Lorinda Creed NP  Palliative Medicine Team Team Phone # [phone] Pager [phone]

## 2023-08-21 NOTE — Progress Notes (Signed)
Patient refuse foley out at this time, patient would take it off early in am. MD made aware.

## 2023-08-21 NOTE — Progress Notes (Signed)
Occupational Therapy Treatment Patient Details Name: Ariel Arnold MRN: [id number] DOB: 04/23/1956 Today's Date: 08/21/2023   History of present illness 67 yo female admitted to [facility] 8/13 with SOB with Rt pleural effusion. 8/14 & 8/16 thoracentesis. 8/19 transfer to [facility]. 8/21 TIPS procedure with post procedure hypotension. Plan for thoracentesis 8/23. s/p 8/28 thoracentesis and TIPS revision. PMHx; NASH cirrhosis with ascites, T2DM, HTN, PVD, osteomyelitis, depression, anxiety, Lt BKA   OT comments  OT called in to assist NT to get pt back to bed. Pt is making fair progress towards their acute OT goals. Noted to have cognitive limitations in recall, memory, safety and awareness. She benefited from simple 1 step commands to sequence simple functional transfer. Overall she needed mod A to stand from the chair and mod A to pivot to the bed with the RW; pt is unable to offload her weight and hop at this time. OT to continue to follow acutely to facilitate progress towards established goals. Pt will continue to benefit from skilled inpatient follow up therapy, <3 hours/day.       If plan is discharge home, recommend the following:  A lot of help with walking and/or transfers;A lot of help with bathing/dressing/bathroom   Equipment Recommendations  None recommended by OT       Precautions / Restrictions Precautions Precautions: Fall;Other (comment) Precaution Comments: Lt BKA Restrictions Weight Bearing Restrictions: Yes LLE Weight Bearing: Non weight bearing Other Position/Activity Restrictions: hx of L BKA       Mobility Bed Mobility Overal bed mobility: Needs Assistance Bed Mobility: Sit to Supine       Sit to supine: Min assist        Transfers Overall transfer level: Needs assistance Equipment used: Rolling walker (2 wheels) Transfers: Sit to/from Stand, Bed to chair/wheelchair/BSC Sit to Stand: Mod assist Stand pivot transfers: Mod assist         General  transfer comment: mod A to stand from chair and pivot to bed with RW     Balance Overall balance assessment: Needs assistance Sitting-balance support: No upper extremity supported Sitting balance-Leahy Scale: Fair     Standing balance support: Bilateral upper extremity supported, No upper extremity supported Standing balance-Leahy Scale: Poor                             ADL either performed or assessed with clinical judgement   ADL Overall ADL's : Needs assistance/impaired                     Lower Body Dressing: Maximal assistance Lower Body Dressing Details (indicate cue type and reason): management of doffing L prosthetic in sitting Toilet Transfer: Moderate assistance;Stand-pivot;Rolling walker (2 wheels) Toilet Transfer Details (indicate cue type and reason): from chair>bed. significantly increased time needed         Functional mobility during ADLs: Moderate assistance General ADL Comments: unable to hop with RW    Extremity/Trunk Assessment Upper Extremity Assessment Upper Extremity Assessment: Generalized weakness   Lower Extremity Assessment Lower Extremity Assessment: Defer to PT evaluation        Vision   Vision Assessment?: No apparent visual deficits Additional Comments: wears galsses, did not formally assess   Perception Perception Perception: Not tested   Praxis Praxis Praxis: Not tested    Cognition Arousal: Alert Behavior During Therapy: WFL for tasks assessed/performed Overall Cognitive Status: Impaired/Different from baseline Area of Impairment: Memory, Following commands, Safety/judgement,  Awareness, Problem solving                     Memory: Decreased short-term memory, Decreased recall of precautions Following Commands: Follows one step commands consistently Safety/Judgement: Decreased awareness of safety, Decreased awareness of deficits Awareness: Emergent Problem Solving: Slow processing General Comments:  difficulty recalling timeline of L BKA and prior PT session. Needs simple 1 step cues for simple mobiliyt              General Comments VSS on RA    Pertinent Vitals/ Pain       Pain Assessment Pain Assessment: Faces Faces Pain Scale: Hurts a little bit Pain Location: generalized with movement Pain Descriptors / Indicators: Discomfort Pain Intervention(s): Limited activity within patient's tolerance, Monitored during session   Frequency  Min 1X/week        Progress Toward Goals  OT Goals(current goals can now be found in the care plan section)  Progress towards OT goals: Progressing toward goals  Acute Rehab OT Goals Patient Stated Goal: to get stronger OT Goal Formulation: With patient Time For Goal Achievement: 09/01/23 Potential to Achieve Goals: Fair ADL Goals Pt Will Perform Grooming: with set-up;sitting Pt Will Perform Upper Body Dressing: with min assist;sitting Pt Will Perform Lower Body Dressing: with mod assist;sit to/from stand;sitting/lateral leans Pt Will Transfer to Toilet: with min assist;squat pivot transfer;stand pivot transfer;bedside commode Additional ADL Goal #1: P to complete bed mobility with Min A in prep for OOB ADL         AM-PAC OT "6 Clicks" Daily Activity     Outcome Measure   Help from another person eating meals?: A Little Help from another person taking care of personal grooming?: A Little Help from another person toileting, which includes using toliet, bedpan, or urinal?: A Lot Help from another person bathing (including washing, rinsing, drying)?: A Lot Help from another person to put on and taking off regular upper body clothing?: A Lot Help from another person to put on and taking off regular lower body clothing?: Total 6 Click Score: 13    End of Session Equipment Utilized During Treatment: Rolling walker (2 wheels);Gait belt  OT Visit Diagnosis: Unsteadiness on feet (R26.81);Other abnormalities of gait and mobility  (R26.89);Muscle weakness (generalized) (M62.81);Other symptoms and signs involving cognitive function   Activity Tolerance Patient tolerated treatment well   Patient Left in bed;with call bell/phone within reach;with bed alarm set;with nursing/sitter in room   Nurse Communication Mobility status        Time: 1610-9604 OT Time Calculation (min): 11 min  Charges: OT General Charges $OT Visit: 1 Visit OT Treatments $Therapeutic Activity: 8-22 mins  Derenda Mis, OTR/L Acute Rehabilitation Services Office [phone] Secure Chat Communication Preferred   Donia Pounds 08/21/2023, 4:34 PM

## 2023-08-22 LAB — BASIC METABOLIC PANEL
Anion gap: 6 (ref 5–15)
BUN: 21 mg/dL (ref 8–23)
CO2: 29 mmol/L (ref 22–32)
Calcium: 8.2 mg/dL — ABNORMAL LOW (ref 8.9–10.3)
Chloride: 100 mmol/L (ref 98–111)
Creatinine, Ser: 1.16 mg/dL — ABNORMAL HIGH (ref 0.44–1.00)
GFR, Estimated: 51 mL/min — ABNORMAL LOW (ref >60)
Glucose, Bld: 205 mg/dL — ABNORMAL HIGH (ref 70–99)
Potassium: 4.1 mmol/L (ref 3.5–5.1)
Sodium: 135 mmol/L (ref 135–145)

## 2023-08-22 LAB — CBC
HCT: 23.4 % — ABNORMAL LOW (ref 36.0–46.0)
Hemoglobin: 7.6 g/dL — ABNORMAL LOW (ref 12.0–15.0)
MCH: 31.1 pg (ref 26.0–34.0)
MCHC: 32.5 g/dL (ref 30.0–36.0)
MCV: 95.9 fL (ref 80.0–100.0)
Platelets: 72 10*3/uL — ABNORMAL LOW (ref 150–400)
RBC: 2.44 MIL/uL — ABNORMAL LOW (ref 3.87–5.11)
RDW: 15.8 % — ABNORMAL HIGH (ref 11.5–15.5)
WBC: 2.7 10*3/uL — ABNORMAL LOW (ref 4.0–10.5)
nRBC: 0 % (ref 0.0–0.2)

## 2023-08-22 LAB — GLUCOSE, CAPILLARY
Glucose-Capillary: 138 mg/dL — ABNORMAL HIGH (ref 70–99)
Glucose-Capillary: 182 mg/dL — ABNORMAL HIGH (ref 70–99)
Glucose-Capillary: 191 mg/dL — ABNORMAL HIGH (ref 70–99)
Glucose-Capillary: 231 mg/dL — ABNORMAL HIGH (ref 70–99)

## 2023-08-22 MED ORDER — FUROSEMIDE 40 MG PO TABS: 40.0000 mg | ORAL_TABLET | Freq: Two times a day (BID) | ORAL | 0 refills | Status: AC

## 2023-08-22 MED ORDER — SPIRONOLACTONE 100 MG PO TABS: 100.0000 mg | ORAL_TABLET | Freq: Every day | ORAL | 0 refills | Status: AC

## 2023-08-22 MED ORDER — OXYCODONE HCL 5 MG PO TABS: 5.0000 mg | ORAL_TABLET | Freq: Four times a day (QID) | ORAL | 0 refills | Status: AC | PRN

## 2023-08-22 MED ORDER — MIDODRINE HCL 5 MG PO TABS: 5.0000 mg | ORAL_TABLET | Freq: Three times a day (TID) | ORAL | 0 refills | Status: AC

## 2023-08-22 MED ORDER — LACTULOSE 10 GM/15ML PO SOLN: 10.0000 g | Freq: Three times a day (TID) | ORAL | 0 refills | Status: AC

## 2023-08-22 NOTE — Progress Notes (Signed)
Patient left with PTAR home, AVS given. Patient daughter made aware.

## 2023-08-22 NOTE — Progress Notes (Signed)
Physical Therapy Treatment Patient Details Name: Ariel Arnold MRN: [id number] DOB: September 07, 1956 Today's Date: 08/22/2023   History of Present Illness 67 yo female admitted to [facility] 8/13 with SOB with Rt pleural effusion. 8/14 & 8/16 thoracentesis. 8/19 transfer to [facility]. 8/21 TIPS procedure with post procedure hypotension. Plan for thoracentesis 8/23. s/p 8/28 thoracentesis and TIPS revision. PMHx; NASH cirrhosis with ascites, T2DM, HTN, PVD, osteomyelitis, depression, anxiety, Lt BKA    PT Comments  Continuing work on functional mobility and activity tolerance;  session focused on functional transfers, specifically lateral scooting, in prep for dc home; Pt performed scooting well with sliding board, and indicated she thinks her wc will work for a sliding board;   REc pt continue work on fit and use of prosthesis; it will help with functional mobility     If plan is discharge home, recommend the following: A lot of help with walking and/or transfers;A lot of help with bathing/dressing/bathroom;Assistance with cooking/housework;Assist for transportation   Can travel by private vehicle     No  Equipment Recommendations  Other (comment) (drop-arm BSC, consider tub transfer bench; sliding board)    Recommendations for Other Services OT consult     Precautions / Restrictions Precautions Precautions: Fall;Other (comment) Precaution Comments: Lt BKA Restrictions Other Position/Activity Restrictions: hx of L BKA; prosthesis is new     Mobility  Bed Mobility Overal bed mobility: Needs Assistance Bed Mobility: Supine to Sit     Supine to sit: Supervision     General bed mobility comments: Used bed  rails; cues to scoot fully to EOB    Transfers Overall transfer level: Needs assistance                Lateral/Scoot Transfers: Contact guard assist, With slide board General transfer comment: Laterally scooted bed to chair at bedside, tehn chair to recliner; good use of  sliding board, as well as good use of R foot as a pivot point    Ambulation/Gait               General Gait Details: unable at baseline   Stairs             Wheelchair Mobility     Tilt Bed    Modified Rankin (Stroke Patients Only)       Balance     Sitting balance-Leahy Scale: Fair       Standing balance-Leahy Scale: Poor Standing balance comment: reliant on BUE support                            Cognition Arousal: Alert Behavior During Therapy: WFL for tasks assessed/performed Overall Cognitive Status: Within Functional Limits for tasks assessed                                 General Comments: cognition WFL for simple mobiltiy tasks        Exercises      General Comments General comments (skin integrity, edema, etc.): NAD on RA      Pertinent Vitals/Pain Pain Assessment Pain Assessment: Faces Faces Pain Scale: No hurt Pain Intervention(s): Monitored during session    Home Living                          Prior Function            PT  Goals (current goals can now be found in the care plan section) Acute Rehab PT Goals Patient Stated Goal: return home PT Goal Formulation: With patient Time For Goal Achievement: 08/24/23 Potential to Achieve Goals: Fair Progress towards PT goals: Progressing toward goals    Frequency    Min 1X/week      PT Plan      Co-evaluation              AM-PAC PT "6 Clicks" Mobility   Outcome Measure  Help needed turning from your back to your side while in a flat bed without using bedrails?: None Help needed moving from lying on your back to sitting on the side of a flat bed without using bedrails?: None Help needed moving to and from a bed to a chair (including a wheelchair)?: A Little Help needed standing up from a chair using your arms (e.g., wheelchair or bedside chair)?: A Lot Help needed to walk in hospital room?: Total Help needed climbing 3-5  steps with a railing? : Total 6 Click Score: 15    End of Session Equipment Utilized During Treatment: Other (comment) (sliding board) Activity Tolerance: Patient tolerated treatment well Patient left: in chair;with call bell/phone within reach;with chair alarm set Nurse Communication: Mobility status PT Visit Diagnosis: Other abnormalities of gait and mobility (R26.89);Muscle weakness (generalized) (M62.81)     Time: 6440-3474 PT Time Calculation (min) (ACUTE ONLY): 39 min  Charges:    $Therapeutic Activity: 38-52 mins PT General Charges $$ ACUTE PT VISIT: 1 Visit                     Van Clines, PT  Acute Rehabilitation Services Office [phone] Secure Chat welcomed    Levi Aland 08/22/2023, 2:03 PM

## 2023-08-22 NOTE — Discharge Summary (Signed)
Physician Discharge Summary  [name]:[id number] DOB: August 22, 1956 DOA: 08/07/2023  PCP: Joaquin Courts, DO  Admit date: 08/07/2023 Discharge date: 08/22/2023  Admitted From: home Disposition:  home with hospice  Recommendations for Outpatient Follow-up:  Follow up with PCP in 1-2 weeks Please obtain BMP/CBC in one week Follow up with hospice services  Home Health: none Equipment/Devices: none  Discharge Condition: stable CODE STATUS: DNR Diet Orders (From admission, onward)     Start     Ordered   08/16/23 1555  Diet regular Room service appropriate? Yes; Fluid consistency: Thin  Diet effective now       Question Answer Comment  Room service appropriate? Yes   Fluid consistency: Thin      08/16/23 1555            HPI: Per admitting MD, Ariel Arnold is a pleasant 67 y.o. female with medical history significant for NASH cirrhosis with ascites, insulin-dependent diabetes mellitus, depression, anxiety, and right heel ulcer who presented to [facility] on 08/01/2023 with shortness of breath. Patient reported that she had undergone her first paracentesis approximately a week earlier and has since developed progressive shortness of breath with chest discomfort.  She was having mild nonproductive cough but no fever or chills. [facility] Course: Upon arrival to the ED, patient was found to have large right pleural effusion and a new supplemental oxygen requirement.  She was admitted to the hospitalist service and underwent therapeutic thoracenteses on 08/02/2023 and again on 08/04/2023.  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  Dr. Archer Asa of IR agreed to see the patient in consultation for consideration of TIPS.  She was transferred to [facility] under the hospitalist service for IR evaluation.  Hospital Course / Discharge diagnoses: Principal Problem:   Decompensated hepatic cirrhosis (HCC) Active Problems:   Acute respiratory  failure with hypoxia (HCC)   Recurrent pleural effusion on right   Type 2 diabetes mellitus with diabetic neuropathy, unspecified (HCC)   Thrombocytopenia (HCC)   Essential hypertension   CKD stage G3a/A3, GFR 45-59 and albumin creatinine ratio >300 mg/g (HCC)   Major depressive disorder, single episode, unspecified   Hypomagnesemia   History of peptic ulcer disease   Mixed hyperlipidemia   Pressure injury of skin   Acute pulmonary edema (HCC)   Pleural effusion on right   Principal problem Acute hypoxic respiratory failure secondary to recurrent pleural effusion due to hepatic hydrothorax and pulmonary edema -gastroenterology consulted and followed patient while hospitalized.  Pulmonary was consulted as well.  She underwent repeated thoracenteses, and eventually IR was consulted and underwent TIPS placement on 8/21.  Following that, she continued to have fluid reaccumulation despite diuretics, and underwent TIPS revision 8/20 weight.  Eventually she stabilized on diuretics, has been converted to p.o. and she is tolerating these well.  Her hypoxia resolved and she was weaned off to room air.  She has been placed on Lasix 40 mg twice daily as well as 100 mg of spironolactone daily.  She is now stable, will be discharged home with hospice   Active problems NASH cirrhosis - S/p TIPS on 08/09/2023 with postprocedure hypotension and respiratory distress. Continue with lactulose and rifaximin Stage IIIa CKD -creatinine stable today Hypokalemia, hypomagnesemia-stable, repeat blood work in a week as an outpatient Anemia of chronic disease - From cirrhosis.  Hemoglobin stable in the 7 range. Chronic thrombocytopenia - From cirrhosis, continue to monitor.  Overall stable Depression/anxiety - Continue with sertraline.  Hyperlipidemia - Continue with Pravachol   In view of poor progression, deconditioning palliative care consulted, patient now going home with hospice  Sepsis ruled out   Discharge  Instructions   Allergies as of 08/22/2023       Reactions   Tape Rash        Medication List     TAKE these medications    albuterol 108 (90 Base) MCG/ACT inhaler Commonly known as: VENTOLIN HFA Inhale 2 puffs into the lungs 2 (two) times daily as needed for shortness of breath or wheezing.   ascorbic acid 500 MG tablet Commonly known as: VITAMIN C Take 500 mg by mouth 2 (two) times daily.   Calcium 1200 1200-1000 MG-UNIT Chew Chew 2 tablets by mouth daily.   carvedilol 6.25 MG tablet Commonly known as: COREG Take 1 tablet (6.25 mg total) by mouth 2 (two) times daily.   clotrimazole-betamethasone cream Commonly known as: LOTRISONE Apply 1 Application topically daily as needed (for yeast/rash).   diclofenac Sodium 1 % Gel Commonly known as: VOLTAREN Apply 2 g topically in the morning and at bedtime.   ferrous sulfate 325 (65 FE) MG tablet Take 325 mg by mouth in the morning and at bedtime.   folic acid 1 MG tablet Commonly known as: FOLVITE Take 1 mg by mouth 2 (two) times daily.   furosemide 40 MG tablet Commonly known as: LASIX Take 1 tablet (40 mg total) by mouth 2 (two) times daily. What changed:  medication strength how much to take when to take this   gabapentin 600 MG tablet Commonly known as: NEURONTIN Take 600 mg by mouth 3 (three) times daily as needed (nerve pain).   hydrOXYzine 25 MG tablet Commonly known as: ATARAX Take 25 mg by mouth daily.   insulin aspart 100 UNIT/ML injection Commonly known as: novoLOG 0-20 Units, Subcutaneous, 3 times daily with meal CBG < 70: Implement Hypoglycemia measures CBG 70 - 120: 0 units CBG 121 - 150: 3 units CBG 151 - 200: 4 units CBG 201 - 250: 7 units CBG 251 - 300: 11 units CBG 301 - 350: 15 units CBG 351 - 400: 20 units CBG > 400: call MD   isosorbide mononitrate 30 MG 24 hr tablet Commonly known as: IMDUR Take by mouth.   ketorolac 0.5 % ophthalmic solution Commonly known as: ACULAR Place 1 drop  into both eyes 4 (four) times daily as needed (dry eyes).   lactulose 10 GM/15ML solution Commonly known as: CHRONULAC Take 15 mLs (10 g total) by mouth 3 (three) times daily. What changed:  when to take this additional instructions   midodrine 5 MG tablet Commonly known as: PROAMATINE Take 1 tablet (5 mg total) by mouth 3 (three) times daily with meals.   oxyCODONE 5 MG immediate release tablet Commonly known as: Oxy IR/ROXICODONE Take 1 tablet (5 mg total) by mouth every 6 (six) hours as needed for moderate pain (pain score 4-6).   pantoprazole 40 MG tablet Commonly known as: PROTONIX Take 40 mg by mouth daily as needed (heartburn).   pravastatin 20 MG tablet Commonly known as: PRAVACHOL Take 1 tablet (20 mg total) by mouth at bedtime. Take after completion of daptomycin   rifaximin 550 MG Tabs tablet Commonly known as: XIFAXAN Take 1 tablet (550 mg total) by mouth 2 (two) times daily.   sertraline 100 MG tablet Commonly known as: ZOLOFT Take 2 tablets (200 mg total) by mouth daily. What changed: how much to take  sodium hypochlorite external solution Commonly known as: DAKIN'S 1/2 STRENGTH Irrigate with 1 Application as directed daily.   spironolactone 100 MG tablet Commonly known as: ALDACTONE Take 1 tablet (100 mg total) by mouth daily. Start taking on: August 23, 2023 What changed:  medication strength how much to take when to take this reasons to take this   Toujeo Max SoloStar 300 UNIT/ML Solostar Pen Generic drug: insulin glargine (2 Unit Dial) Inject 20 Units into the skin at bedtime.   Vitamin D (Ergocalciferol) 1.25 MG (50000 UNIT) Caps capsule Commonly known as: DRISDOL Take 1 capsule by mouth every 7 (seven) days. Mondays        Follow-up Information     [facility] Follow up.   Contact information: Address: [address], [place], [place] [address] Phone: [phone]        Joaquin Courts, DO  Follow up in 2 week(s).   Specialty: Family Medicine Contact information: [address] [place] [address] [phone]         Doristine Locks V, DO Follow up in 2 week(s).   Specialty: Gastroenterology Why: call for an appointment Contact information: [address] [place] [address] [phone]                 Consultations: GI Pulmonary IR  Procedures/Studies:  DG Chest 2 View  Result Date: 08/20/2023 CLINICAL DATA:  [id number] Dyspnea [id number] EXAM: CHEST - 2 VIEW COMPARISON:  08/18/2023. FINDINGS: Cardiac silhouette is prominent. There is pulmonary interstitial prominence with vascular congestion. Consolidation or volume loss in the right base may represent atelectasis. No pneumothorax. There is right-sided pleural effusion. IMPRESSION: Findings suggest CHF. Right basilar pleural effusion and atelectatic changes. Electronically Signed   By: Layla Maw M.D.   On: 08/20/2023 09:44   DG Chest 2 View  Result Date: 08/18/2023 CLINICAL DATA:  Pleural effusion EXAM: CHEST - 2 VIEW COMPARISON:  08/17/2023 FINDINGS: Unchanged AP portable chest radiograph. Cardiomegaly. Diffuse bilateral interstitial pulmonary opacity. Small right pleural effusion. The visualized skeletal structures are unremarkable. IMPRESSION: Cardiomegaly with diffuse bilateral interstitial pulmonary opacity, consistent with edema or infection. No new airspace opacity. Small right pleural effusion. Electronically Signed   By: Jearld Lesch M.D.   On: 08/18/2023 08:24   DG CHEST PORT 1 VIEW  Result Date: 08/17/2023 CLINICAL DATA:  06301 Pleural effusion associated with hepatic disorder 60109 EXAM: PORTABLE CHEST 1 VIEW COMPARISON:  08/16/2023 FINDINGS: Heart and mediastinal contours within normal limits. Aortic atherosclerosis. Diffuse interstitial and airspace opacities, right greater than left. Moderate layering right pleural effusion. Airspace disease and effusion have worsened since prior  study. IMPRESSION: Worsening interstitial and airspace opacities, right greater than left could reflect asymmetric edema or infection. Moderate right pleural effusion. Electronically Signed   By: Charlett Nose M.D.   On: 08/17/2023 10:28   IR TIPS REVISION MOD SED  Result Date: 08/16/2023 CLINICAL DATA:  Hepatic hydrothorax, status post TIPS creation 08/09/2023. There is subsequent reaccumulation of right pleural effusion. EXAM: TIPS REVISION; IR THORACENTESIS ASP PLEURAL SPACE W/IMG GUIDE; ULTRASOUND ABDOMEN LIMITED; IR ULTRASOUND GUIDANCE VASC ACCESS RIGHT ANESTHESIA/SEDATION: Intravenous Fentanyl and Versed 0.5mg  were administered as conscious sedation during continuous monitoring of the patient's level of consciousness and physiological / cardiorespiratory status by the radiology RN, with a total moderate sedation time of 75 minutes. MEDICATIONS: Lidocaine 1% subcutaneous CONTRAST:  30mL OMNIPAQUE IOHEXOL 300 MG/ML  SOLN PROCEDURE: The procedure, risks (including but not limited to bleeding, infection,  organ damage ), benefits, and alternatives were explained to the patient. Questions regarding the procedure were encouraged and answered. The patient understands and consents to the procedure. Survey ultrasound of the right hemithorax was performed, moderate recurrent pleural effusion identified, and an appropriate skin entry site was localized. Site was marked, prepped with chlorhexidine, draped in usual sterile fashion, infiltrated locally with 1% lidocaine. The Saf-T-Centesis needle was advanced into the pleural space. Clear yellow pleural fluid returned. 2.2 L was removed. Post procedure imaging shows no residual fluid. The patient tolerated procedure well. Subsequently, right IJ region prepped and draped in usual sterile fashion. Maximal barrier sterile technique was utilized including caps, mask, sterile gowns, sterile gloves, sterile drape, hand hygiene and skin antiseptic. Under real-time  ultrasound guidance, right IJ vein accessed with micropuncture set, exchanged over a Bentson wire for a 7 French 35 cm vascular sheath. Through this, a 5 [place] Kumpe catheter advanced and used to catheterize the right hepatic vein, and TIPS into the native portal venous system. TIPS venogram was obtained. Pressure measurements in the portal vein and at the right atrium level were obtained. Catheter was exchanged over a Rosen wire for an 8 mm x 4 cm Conquest angioplasty balloon, and balloon angioplasty of the TIPS stent and tract was performed. Pressure measurements in the portal vein and at the right atrium level were obtained. Subsequently, catheter exchanged for a 10 mm by 4 cm Conquest angioplasty balloon, and further balloon angioplasty of the TIPS stent and tract was performed. Pressure measurements in the portal vein and at the right atrium level were obtained. A final TIPS venogram was obtained. Catheter and sheath were removed and hemostasis achieved at the right IJ site with manual compression. Survey ultrasound of the abdomen showed a small amount of scattered abdominal ascites. Paracentesis was therefore deferred. The patient tolerated the procedure well. COMPLICATIONS: None immediate FINDINGS: Moderate right pleural effusion identified. 2.2 L thoracentesis performed. Initial TIPS venogram shows wide patency of the shunt. There is antegrade flow in the main portal vein, and hepatopetal flow in the right and left portal vein branches. No hepatic vein stenosis. Pressure measurements portal vein mean 17 mm Hg, right atrium mean 7 mmHg (gradient 10 mmHg). The TIPS stent and tract were dilated along entire length with the 8 mm balloon with overlapping inflations. Post pressures portal mean 15 mm Hg, right atrium 7 mmHg (gradient 8 mmHg). After further dilatation to 10 mm, post pressures portal mean 16 mm Hg, right atrium 10 mmHg (gradient 6 mmHg). Post TIPS venogram shows excellent flow through the stent.  Continued hepatopetal flow in right and left portal branches. No hepatic vein stenosis. IMPRESSION: 1. TIPS remains patent, with 10 mmHg portosystemic gradient, increased from 4 mm gradient reported at the time of deployment. 2. Balloon angioplasty of TIPS stent and tract using 8 and 10 mm balloons with final portosystemic gradient 6 mmHg. 3. 2.2 L pleural fluid removed during right thoracentesis. 4. Small volume residual/recurrent abdominal ascites; paracentesis deferred. Electronically Signed   By: Corlis Leak M.D.   On: 08/16/2023 15:46   IR THORACENTESIS ASP PLEURAL SPACE W/IMG GUIDE  Result Date: 08/16/2023 CLINICAL DATA:  Hepatic hydrothorax, status post TIPS creation 08/09/2023. There is subsequent reaccumulation of right pleural effusion. EXAM: TIPS REVISION; IR THORACENTESIS ASP PLEURAL SPACE W/IMG GUIDE; ULTRASOUND ABDOMEN LIMITED; IR ULTRASOUND GUIDANCE VASC ACCESS RIGHT ANESTHESIA/SEDATION: Intravenous Fentanyl and Versed 0.5mg  were administered as conscious sedation during continuous monitoring of the patient's level of consciousness and physiological /  cardiorespiratory status by the radiology RN, with a total moderate sedation time of 75 minutes. MEDICATIONS: Lidocaine 1% subcutaneous CONTRAST:  30mL OMNIPAQUE IOHEXOL 300 MG/ML  SOLN PROCEDURE: The procedure, risks (including but not limited to bleeding, infection, organ damage ), benefits, and alternatives were explained to the patient. Questions regarding the procedure were encouraged and answered. The patient understands and consents to the procedure. Survey ultrasound of the right hemithorax was performed, moderate recurrent pleural effusion identified, and an appropriate skin entry site was localized. Site was marked, prepped with chlorhexidine, draped in usual sterile fashion, infiltrated locally with 1% lidocaine. The Saf-T-Centesis needle was advanced into the pleural space. Clear yellow pleural fluid returned. 2.2 L was removed. Post  procedure imaging shows no residual fluid. The patient tolerated procedure well. Subsequently, right IJ region prepped and draped in usual sterile fashion. Maximal barrier sterile technique was utilized including caps, mask, sterile gowns, sterile gloves, sterile drape, hand hygiene and skin antiseptic. Under real-time ultrasound guidance, right IJ vein accessed with micropuncture set, exchanged over a Bentson wire for a 7 French 35 cm vascular sheath. Through this, a 5 [place] Kumpe catheter advanced and used to catheterize the right hepatic vein, and TIPS into the native portal venous system. TIPS venogram was obtained. Pressure measurements in the portal vein and at the right atrium level were obtained. Catheter was exchanged over a Rosen wire for an 8 mm x 4 cm Conquest angioplasty balloon, and balloon angioplasty of the TIPS stent and tract was performed. Pressure measurements in the portal vein and at the right atrium level were obtained. Subsequently, catheter exchanged for a 10 mm by 4 cm Conquest angioplasty balloon, and further balloon angioplasty of the TIPS stent and tract was performed. Pressure measurements in the portal vein and at the right atrium level were obtained. A final TIPS venogram was obtained. Catheter and sheath were removed and hemostasis achieved at the right IJ site with manual compression. Survey ultrasound of the abdomen showed a small amount of scattered abdominal ascites. Paracentesis was therefore deferred. The patient tolerated the procedure well. COMPLICATIONS: None immediate FINDINGS: Moderate right pleural effusion identified. 2.2 L thoracentesis performed. Initial TIPS venogram shows wide patency of the shunt. There is antegrade flow in the main portal vein, and hepatopetal flow in the right and left portal vein branches. No hepatic vein stenosis. Pressure measurements portal vein mean 17 mm Hg, right atrium mean 7 mmHg (gradient 10 mmHg). The TIPS stent and tract were dilated  along entire length with the 8 mm balloon with overlapping inflations. Post pressures portal mean 15 mm Hg, right atrium 7 mmHg (gradient 8 mmHg). After further dilatation to 10 mm, post pressures portal mean 16 mm Hg, right atrium 10 mmHg (gradient 6 mmHg). Post TIPS venogram shows excellent flow through the stent. Continued hepatopetal flow in right and left portal branches. No hepatic vein stenosis. IMPRESSION: 1. TIPS remains patent, with 10 mmHg portosystemic gradient, increased from 4 mm gradient reported at the time of deployment. 2. Balloon angioplasty of TIPS stent and tract using 8 and 10 mm balloons with final portosystemic gradient 6 mmHg. 3. 2.2 L pleural fluid removed during right thoracentesis. 4. Small volume residual/recurrent abdominal ascites; paracentesis deferred. Electronically Signed   By: Corlis Leak M.D.   On: 08/16/2023 15:46   IR US Guide Vasc Access Right  Result Date: 08/16/2023 CLINICAL DATA:  Hepatic hydrothorax, status post TIPS creation 08/09/2023. There is subsequent reaccumulation of right pleural effusion. EXAM: TIPS REVISION; IR  THORACENTESIS ASP PLEURAL SPACE W/IMG GUIDE; ULTRASOUND ABDOMEN LIMITED; IR ULTRASOUND GUIDANCE VASC ACCESS RIGHT ANESTHESIA/SEDATION: Intravenous Fentanyl and Versed 0.5mg  were administered as conscious sedation during continuous monitoring of the patient's level of consciousness and physiological / cardiorespiratory status by the radiology RN, with a total moderate sedation time of 75 minutes. MEDICATIONS: Lidocaine 1% subcutaneous CONTRAST:  30mL OMNIPAQUE IOHEXOL 300 MG/ML  SOLN PROCEDURE: The procedure, risks (including but not limited to bleeding, infection, organ damage ), benefits, and alternatives were explained to the patient. Questions regarding the procedure were encouraged and answered. The patient understands and consents to the procedure. Survey ultrasound of the right hemithorax was performed, moderate recurrent pleural effusion  identified, and an appropriate skin entry site was localized. Site was marked, prepped with chlorhexidine, draped in usual sterile fashion, infiltrated locally with 1% lidocaine. The Saf-T-Centesis needle was advanced into the pleural space. Clear yellow pleural fluid returned. 2.2 L was removed. Post procedure imaging shows no residual fluid. The patient tolerated procedure well. Subsequently, right IJ region prepped and draped in usual sterile fashion. Maximal barrier sterile technique was utilized including caps, mask, sterile gowns, sterile gloves, sterile drape, hand hygiene and skin antiseptic. Under real-time ultrasound guidance, right IJ vein accessed with micropuncture set, exchanged over a Bentson wire for a 7 French 35 cm vascular sheath. Through this, a 5 [place] Kumpe catheter advanced and used to catheterize the right hepatic vein, and TIPS into the native portal venous system. TIPS venogram was obtained. Pressure measurements in the portal vein and at the right atrium level were obtained. Catheter was exchanged over a Rosen wire for an 8 mm x 4 cm Conquest angioplasty balloon, and balloon angioplasty of the TIPS stent and tract was performed. Pressure measurements in the portal vein and at the right atrium level were obtained. Subsequently, catheter exchanged for a 10 mm by 4 cm Conquest angioplasty balloon, and further balloon angioplasty of the TIPS stent and tract was performed. Pressure measurements in the portal vein and at the right atrium level were obtained. A final TIPS venogram was obtained. Catheter and sheath were removed and hemostasis achieved at the right IJ site with manual compression. Survey ultrasound of the abdomen showed a small amount of scattered abdominal ascites. Paracentesis was therefore deferred. The patient tolerated the procedure well. COMPLICATIONS: None immediate FINDINGS: Moderate right pleural effusion identified. 2.2 L thoracentesis performed. Initial TIPS venogram  shows wide patency of the shunt. There is antegrade flow in the main portal vein, and hepatopetal flow in the right and left portal vein branches. No hepatic vein stenosis. Pressure measurements portal vein mean 17 mm Hg, right atrium mean 7 mmHg (gradient 10 mmHg). The TIPS stent and tract were dilated along entire length with the 8 mm balloon with overlapping inflations. Post pressures portal mean 15 mm Hg, right atrium 7 mmHg (gradient 8 mmHg). After further dilatation to 10 mm, post pressures portal mean 16 mm Hg, right atrium 10 mmHg (gradient 6 mmHg). Post TIPS venogram shows excellent flow through the stent. Continued hepatopetal flow in right and left portal branches. No hepatic vein stenosis. IMPRESSION: 1. TIPS remains patent, with 10 mmHg portosystemic gradient, increased from 4 mm gradient reported at the time of deployment. 2. Balloon angioplasty of TIPS stent and tract using 8 and 10 mm balloons with final portosystemic gradient 6 mmHg. 3. 2.2 L pleural fluid removed during right thoracentesis. 4. Small volume residual/recurrent abdominal ascites; paracentesis deferred. Electronically Signed   By: Ronald Pippins.D.  On: 08/16/2023 15:46   IR ABDOMEN US LIMITED  Result Date: 08/16/2023 CLINICAL DATA:  Hepatic hydrothorax, status post TIPS creation 08/09/2023. There is subsequent reaccumulation of right pleural effusion. EXAM: TIPS REVISION; IR THORACENTESIS ASP PLEURAL SPACE W/IMG GUIDE; ULTRASOUND ABDOMEN LIMITED; IR ULTRASOUND GUIDANCE VASC ACCESS RIGHT ANESTHESIA/SEDATION: Intravenous Fentanyl and Versed 0.5mg  were administered as conscious sedation during continuous monitoring of the patient's level of consciousness and physiological / cardiorespiratory status by the radiology RN, with a total moderate sedation time of 75 minutes. MEDICATIONS: Lidocaine 1% subcutaneous CONTRAST:  30mL OMNIPAQUE IOHEXOL 300 MG/ML  SOLN PROCEDURE: The procedure, risks (including but not limited to bleeding,  infection, organ damage ), benefits, and alternatives were explained to the patient. Questions regarding the procedure were encouraged and answered. The patient understands and consents to the procedure. Survey ultrasound of the right hemithorax was performed, moderate recurrent pleural effusion identified, and an appropriate skin entry site was localized. Site was marked, prepped with chlorhexidine, draped in usual sterile fashion, infiltrated locally with 1% lidocaine. The Saf-T-Centesis needle was advanced into the pleural space. Clear yellow pleural fluid returned. 2.2 L was removed. Post procedure imaging shows no residual fluid. The patient tolerated procedure well. Subsequently, right IJ region prepped and draped in usual sterile fashion. Maximal barrier sterile technique was utilized including caps, mask, sterile gowns, sterile gloves, sterile drape, hand hygiene and skin antiseptic. Under real-time ultrasound guidance, right IJ vein accessed with micropuncture set, exchanged over a Bentson wire for a 7 French 35 cm vascular sheath. Through this, a 5 [place] Kumpe catheter advanced and used to catheterize the right hepatic vein, and TIPS into the native portal venous system. TIPS venogram was obtained. Pressure measurements in the portal vein and at the right atrium level were obtained. Catheter was exchanged over a Rosen wire for an 8 mm x 4 cm Conquest angioplasty balloon, and balloon angioplasty of the TIPS stent and tract was performed. Pressure measurements in the portal vein and at the right atrium level were obtained. Subsequently, catheter exchanged for a 10 mm by 4 cm Conquest angioplasty balloon, and further balloon angioplasty of the TIPS stent and tract was performed. Pressure measurements in the portal vein and at the right atrium level were obtained. A final TIPS venogram was obtained. Catheter and sheath were removed and hemostasis achieved at the right IJ site with manual compression. Survey  ultrasound of the abdomen showed a small amount of scattered abdominal ascites. Paracentesis was therefore deferred. The patient tolerated the procedure well. COMPLICATIONS: None immediate FINDINGS: Moderate right pleural effusion identified. 2.2 L thoracentesis performed. Initial TIPS venogram shows wide patency of the shunt. There is antegrade flow in the main portal vein, and hepatopetal flow in the right and left portal vein branches. No hepatic vein stenosis. Pressure measurements portal vein mean 17 mm Hg, right atrium mean 7 mmHg (gradient 10 mmHg). The TIPS stent and tract were dilated along entire length with the 8 mm balloon with overlapping inflations. Post pressures portal mean 15 mm Hg, right atrium 7 mmHg (gradient 8 mmHg). After further dilatation to 10 mm, post pressures portal mean 16 mm Hg, right atrium 10 mmHg (gradient 6 mmHg). Post TIPS venogram shows excellent flow through the stent. Continued hepatopetal flow in right and left portal branches. No hepatic vein stenosis. IMPRESSION: 1. TIPS remains patent, with 10 mmHg portosystemic gradient, increased from 4 mm gradient reported at the time of deployment. 2. Balloon angioplasty of TIPS stent and tract using 8 and  10 mm balloons with final portosystemic gradient 6 mmHg. 3. 2.2 L pleural fluid removed during right thoracentesis. 4. Small volume residual/recurrent abdominal ascites; paracentesis deferred. Electronically Signed   By: Corlis Leak M.D.   On: 08/16/2023 15:46   DG Chest Port 1 View  Result Date: 08/16/2023 CLINICAL DATA:  Right pleural effusion post thoracentesis EXAM: PORTABLE CHEST - 1 VIEW COMPARISON:  the previous day's study FINDINGS: No pneumothorax. Resolution of right pleural effusion. Improved aeration at the right lung base. Coarse interstitial and alveolar opacities throughout both lungs. Patchy airspace opacities in the left lung base slightly improved. Heart size and mediastinal contours are within normal limits. No  effusion. Visualized bones unremarkable. Left upper quadrant embolization coils. IMPRESSION: 1. Resolution of right pleural effusion. No pneumothorax. 2. Persistent coarse interstitial and alveolar opacities throughout both lungs. Electronically Signed   By: Corlis Leak M.D.   On: 08/16/2023 14:37   DG CHEST PORT 1 VIEW  Result Date: 08/15/2023 CLINICAL DATA:  Short of breath, prior abnormal x-ray EXAM: PORTABLE CHEST 1 VIEW COMPARISON:  08/14/2023 FINDINGS: Single frontal view of the chest demonstrates stable enlargement of the cardiac silhouette. Decreased right pleural effusion since prior study. There is stable multifocal bilateral airspace disease, greatest at the lung bases. No pneumothorax. No acute bony abnormalities. Embolic coils over the central upper abdomen unchanged. IMPRESSION: 1. Multifocal bilateral airspace disease consistent with edema or infection. 2. Decreased right pleural effusion. Electronically Signed   By: Sharlet Salina M.D.   On: 08/15/2023 18:45   DG CHEST PORT 1 VIEW  Result Date: 08/14/2023 CLINICAL DATA:  [id number] Dyspnea [id number] EXAM: PORTABLE CHEST 1 VIEW COMPARISON:  Chest XR, 08/13/2023 and 08/11/2023. CT chest, 08/01/2023. FINDINGS: Cardiomediastinal silhouette is unchanged, with the apex silhouetted by patchy basilar opacities. Aortic arch atherosclerosis Aortic Atherosclerosis (ICD10-I70.0). Hypoinflation with persistent hazy opacification of the RIGHT lung. Perihilar and septal thickening within the LEFT chest. Small volume RIGHT pleural effusion, no LEFT pleural effusion or pneumothorax. TIPS stent at the RIGHT upper quadrant and embolization coils at the LEFT upper quadrant. No interval osseous abnormality. IMPRESSION: Unchanged pulmonary findings RIGHT-greater-than-LEFT pulmonary edema, and small volume RIGHT pleural effusion. Electronically Signed   By: Roanna Banning M.D.   On: 08/14/2023 12:41   DG Chest Port 1 View  Result Date: 08/13/2023 CLINICAL DATA:  Pleural  effusion EXAM: PORTABLE CHEST 1 VIEW COMPARISON:  Two days ago FINDINGS: Cardiomegaly. Diffuse interstitial opacity with sizable right pleural effusion. No pneumothorax. Embolization coils in the left upper quadrant and stent over the right upper quadrant. IMPRESSION: Pulmonary edema and right pleural effusion, similar to prior. Electronically Signed   By: Tiburcio Pea M.D.   On: 08/13/2023 08:13   DG CHEST PORT 1 VIEW  Result Date: 08/11/2023 CLINICAL DATA:  Shortness of breath, cough. EXAM: PORTABLE CHEST 1 VIEW COMPARISON:  August 10, 2023. FINDINGS: Stable cardiomediastinal silhouette. Grossly stable bilateral diffuse lung opacities are noted, right greater than left, concerning for edema or pneumonia. Right pleural effusion is again noted. Bony thorax is unremarkable. IMPRESSION: Stable bilateral lung opacities as noted above with associated right pleural effusion. Electronically Signed   By: Lupita Raider M.D.   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 MELD 16 (08/09/2023).  Child Pugh class B, 9 points. EXAM: Procedures: 1. TRANSJUGULAR INTRAHEPATIC PORTOSYSTEMIC SHUNT (TIPS) 2. INTRACARDIAC ECHOCARDIOGRAPHY (  ICE) 3. THERAPEUTIC RIGHT THORACENTESIS 4. THERAPEUTIC PARACENTESIS Performing Physician: Roanna Banning, MD Assistant(s): Acquanetta Belling, MD A qualified trainee/resident or advanced practice provider (APP) was not immediately available to assist with this case. MEDICATIONS: As antibiotic prophylaxis, Rocephin 2 gm IV was ordered pre-procedure and administered intravenously within one hour of incision. ANESTHESIA/SEDATION: Sedation by the Anesthesia Team was performed. Please see anesthesiology log for details. CONTRAST:  75 mL Omnipaque 300, intravenous FLUOROSCOPY TIME:  Fluoroscopic dose; 80 mGy COMPLICATIONS: None immediate.  PROCEDURE: Informed written consent was obtained from the patient and/or patient's representative after a thorough discussion of the procedural risks, benefits and alternatives. All questions were addressed. Maximal Sterile Barrier Technique was utilized including caps, mask, sterile gowns, sterile gloves, sterile drape, hand hygiene and skin antiseptic. A timeout was performed prior to the initiation of the procedure. Initial ultrasound scanning demonstrates a large amount of pleural effusion within the RIGHT chest. Under direct ultrasound guidance, a 6 Fr pigtail catheter was introduced. An ultrasound image was saved for documentation purposes. Therapeutic RIGHT thoracentesis was performed. Additional ultrasound scanning of the abdomen demonstrated a large amount of ascites. Under direct ultrasound guidance, a 7 Fr pigtail catheter was introduced. An ultrasound image was saved for documentation purposes. Therapeutic paracentesis was performed. A preliminary ultrasound of the RIGHT groin was performed and demonstrates a patent greater saphenous vein. A permanent ultrasound image was recorded. Using a combination of fluoroscopy and ultrasound, an access site was determined. A small dermatotomy was made at the planned puncture site. Using ultrasound guidance, access into the RIGHT greater saphenous vein was obtained with visualization of needle entry into the vessel using a standard micropuncture technique. A wire was advanced into the IVC insert all fascial dilation performed. An 8 Fr, 10 cm vascular sheath was placed into the external iliac vein. Through this access site, an 8 Fr AcuNav ICE catheter was advanced with ease under fluoroscopic guidance to the level of the intrahepatic inferior vena cava. A preliminary ultrasound of the RIGHT neck was performed and demonstrates a patent internal jugular vein. A permanent ultrasound image was recorded. Using a combination of fluoroscopy and ultrasound, an access site  was determined. A small dermatotomy was made at the planned puncture site. Using ultrasound guidance, access into the RIGHT internal jugular vein was obtained with visualization of needle entry into the vessel using a standard micropuncture technique. A wire was advanced into the IVC and serial fascial dilation performed. A 10 Fr Argon sheath was placed into the internal jugular vein and advanced to the IVC. The jugular sheath was retracted into the RIGHT atrium and manometry was performed. A 5 Fr angled tip catheter was then directed into the RIGHT hepatic vein. Hepatic venogram was performed. These images demonstrated patent hepatic vein with no stenosis. The catheter was advanced to a wedge portion of the a patent vein over which the sheath was advanced into the distal RIGHT hepatic vein. Using ICE ultrasound visualization the catheter as RIGHT hepatic vein as well as the portal anatomy was defined. A planned exit site from the hepatic vein and puncture site from the portal vein was placed into a single sonographic plane. Under direct ultrasound visualization, the Argon Scorpion X needle was advanced into the central RIGHT portal vein. Hand injection of contrast confirmed position within the portal system. A Glidewire was then advanced into the superior mesenteric vein. The tract was pre dilated with a 6 mm mustang balloon. A 5 Fr marking pigtail catheter was then advanced over the  wire into the main portal vein and wire removed. Portal venogram was performed which demonstrated a patent portal vein. Portosystemic collaterals esophageal varices were seen arising from the main portal vein. Portal manometry was then performed. The tract was then dilated to 8 mm with an 8 mm x 4 cm Athletis balloon. A 8-10 mm by 8 + 2 cm of GORE Viatorr TIPS stent was placed. After placement of the shunt, RIGHT atrial and portal pressures were repeated. Completion portal venogram demonstrates a patent TIPS endograft without  significant residual filling of the varices. The RIGHT thoracentesis drain, paracentesis drain, catheters and sheath were removed and manual compression was applied to the RIGHT internal jugular and greater saphenous venous access sites until hemostasis was achieved. The patient was transferred to the PACU in stable condition. Pre-TIPS Mean Pressures (mmHg): Right atrium: 10 Portal vein: 18 Portosystemic gradient: 8 Post-TIPS Mean Pressures (mmHg): Right atrium: 10 Portal vein: 14 Portosystemic gradient: 4 FINDINGS: 1.  Access via the RIGHT jugular and greater saphenous veins. 2. Successful ICE-guided TIPS, via the RIGHT hepatic vein to the RIGHT portal vein, with placement of a 8+2 cm GORE Viatorr stent. 3. Reduction of portosystemic gradient from 8 to 4 mmHg 4. Therapeutic RIGHT thoracentesis with 2.0 L of serous pleural removed. 5.  Therapeutic paracentesis with 3.9 L of serous ascites removed. IMPRESSION: 1. Successful creation of Transjugular Intrahepatic Portosystemic Shunt (TIPS), with a post-TIPS portosystemic pressure gradient of 4 mmHg. 2. Successful therapeutic RIGHT thoracentesis with drainage of 1.75 L of pleural fluid. 3. Successful therapeutic paracentesis with drainage of 3.9 L of ascites. PLAN: The patient is enrolled in the [facility] portal hypertension follow-up clinic, and will be closely followed per the protocol. Roanna Banning, MD [facility] Radiology Electronically Signed   By: Roanna Banning M.D.   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 MELD 16 (08/09/2023).  Child Pugh class B, 9 points. EXAM: Procedures: 1. TRANSJUGULAR INTRAHEPATIC PORTOSYSTEMIC SHUNT (TIPS) 2. INTRACARDIAC ECHOCARDIOGRAPHY (ICE) 3. THERAPEUTIC RIGHT THORACENTESIS 4.  THERAPEUTIC PARACENTESIS Performing Physician: Roanna Banning, MD Assistant(s): Acquanetta Belling, MD A qualified trainee/resident or advanced practice provider (APP) was not immediately available to assist with this case. MEDICATIONS: As antibiotic prophylaxis, Rocephin 2 gm IV was ordered pre-procedure and administered intravenously within one hour of incision. ANESTHESIA/SEDATION: Sedation by the Anesthesia Team was performed. Please see anesthesiology log for details. CONTRAST:  75 mL Omnipaque 300, intravenous FLUOROSCOPY TIME:  Fluoroscopic dose; 80 mGy COMPLICATIONS: None immediate. PROCEDURE: Informed written consent was obtained from the patient and/or patient's representative after a thorough discussion of the procedural risks, benefits and alternatives. All questions were addressed. Maximal Sterile Barrier Technique was utilized including caps, mask, sterile gowns, sterile gloves, sterile drape, hand hygiene and skin antiseptic. A timeout was performed prior to the initiation of the procedure. Initial ultrasound scanning demonstrates a large amount of pleural effusion within the RIGHT chest. Under direct ultrasound guidance, a 6 Fr pigtail catheter was introduced. An ultrasound image was saved for documentation purposes. Therapeutic RIGHT thoracentesis was performed. Additional ultrasound scanning of the abdomen demonstrated a large amount of ascites. Under direct ultrasound guidance, a 7 Fr pigtail catheter was introduced. An ultrasound image was saved for documentation purposes. Therapeutic paracentesis was performed. A preliminary ultrasound of the RIGHT groin was performed and demonstrates a  patent greater saphenous vein. A permanent ultrasound image was recorded. Using a combination of fluoroscopy and ultrasound, an access site was determined. A small dermatotomy was made at the planned puncture site. Using ultrasound guidance, access into the RIGHT greater saphenous vein was obtained with visualization of  needle entry into the vessel using a standard micropuncture technique. A wire was advanced into the IVC insert all fascial dilation performed. An 8 Fr, 10 cm vascular sheath was placed into the external iliac vein. Through this access site, an 8 Fr AcuNav ICE catheter was advanced with ease under fluoroscopic guidance to the level of the intrahepatic inferior vena cava. A preliminary ultrasound of the RIGHT neck was performed and demonstrates a patent internal jugular vein. A permanent ultrasound image was recorded. Using a combination of fluoroscopy and ultrasound, an access site was determined. A small dermatotomy was made at the planned puncture site. Using ultrasound guidance, access into the RIGHT internal jugular vein was obtained with visualization of needle entry into the vessel using a standard micropuncture technique. A wire was advanced into the IVC and serial fascial dilation performed. A 10 Fr Argon sheath was placed into the internal jugular vein and advanced to the IVC. The jugular sheath was retracted into the RIGHT atrium and manometry was performed. A 5 Fr angled tip catheter was then directed into the RIGHT hepatic vein. Hepatic venogram was performed. These images demonstrated patent hepatic vein with no stenosis. The catheter was advanced to a wedge portion of the a patent vein over which the sheath was advanced into the distal RIGHT hepatic vein. Using ICE ultrasound visualization the catheter as RIGHT hepatic vein as well as the portal anatomy was defined. A planned exit site from the hepatic vein and puncture site from the portal vein was placed into a single sonographic plane. Under direct ultrasound visualization, the Argon Scorpion X needle was advanced into the central RIGHT portal vein. Hand injection of contrast confirmed position within the portal system. A Glidewire was then advanced into the superior mesenteric vein. The tract was pre dilated with a 6 mm mustang balloon. A 5 Fr  marking pigtail catheter was then advanced over the wire into the main portal vein and wire removed. Portal venogram was performed which demonstrated a patent portal vein. Portosystemic collaterals esophageal varices were seen arising from the main portal vein. Portal manometry was then performed. The tract was then dilated to 8 mm with an 8 mm x 4 cm Athletis balloon. A 8-10 mm by 8 + 2 cm of GORE Viatorr TIPS stent was placed. After placement of the shunt, RIGHT atrial and portal pressures were repeated. Completion portal venogram demonstrates a patent TIPS endograft without significant residual filling of the varices. The RIGHT thoracentesis drain, paracentesis drain, catheters and sheath were removed and manual compression was applied to the RIGHT internal jugular and greater saphenous venous access sites until hemostasis was achieved. The patient was transferred to the PACU in stable condition. Pre-TIPS Mean Pressures (mmHg): Right atrium: 10 Portal vein: 18 Portosystemic gradient: 8 Post-TIPS Mean Pressures (mmHg): Right atrium: 10 Portal vein: 14 Portosystemic gradient: 4 FINDINGS: 1.  Access via the RIGHT jugular and greater saphenous veins. 2. Successful ICE-guided TIPS, via the RIGHT hepatic vein to the RIGHT portal vein, with placement of a 8+2 cm GORE Viatorr stent. 3. Reduction of portosystemic gradient from 8 to 4 mmHg 4. Therapeutic RIGHT thoracentesis with 2.0 L of serous pleural removed. 5.  Therapeutic paracentesis with 3.9 L of serous  ascites removed. IMPRESSION: 1. Successful creation of Transjugular Intrahepatic Portosystemic Shunt (TIPS), with a post-TIPS portosystemic pressure gradient of 4 mmHg. 2. Successful therapeutic RIGHT thoracentesis with drainage of 1.75 L of pleural fluid. 3. Successful therapeutic paracentesis with drainage of 3.9 L of ascites. PLAN: The patient is enrolled in the [facility] portal hypertension follow-up clinic, and will be closely followed per the protocol. Roanna Banning, MD [facility] Radiology Electronically Signed   By: Roanna Banning M.D.   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 MELD 16 (08/09/2023).  Child Pugh class B, 9 points. EXAM: Procedures: 1. TRANSJUGULAR INTRAHEPATIC PORTOSYSTEMIC SHUNT (TIPS) 2. INTRACARDIAC ECHOCARDIOGRAPHY (ICE) 3. THERAPEUTIC RIGHT THORACENTESIS 4. THERAPEUTIC PARACENTESIS Performing Physician: Roanna Banning, MD Assistant(s): Acquanetta Belling, MD A qualified trainee/resident or advanced practice provider (APP) was not immediately available to assist with this case. MEDICATIONS: As antibiotic prophylaxis, Rocephin 2 gm IV was ordered pre-procedure and administered intravenously within one hour of incision. ANESTHESIA/SEDATION: Sedation by the Anesthesia Team was performed. Please see anesthesiology log for details. CONTRAST:  75 mL Omnipaque 300, intravenous FLUOROSCOPY TIME:  Fluoroscopic dose; 80 mGy COMPLICATIONS: None immediate. PROCEDURE: Informed written consent was obtained from the patient and/or patient's representative after a thorough discussion of the procedural risks, benefits and alternatives. All questions were addressed. Maximal Sterile Barrier Technique was utilized including caps, mask, sterile gowns, sterile gloves, sterile drape, hand hygiene and skin antiseptic. A timeout was performed prior to the initiation of the procedure. Initial ultrasound scanning demonstrates a large amount of pleural effusion within the RIGHT chest. Under direct ultrasound guidance, a 6 Fr pigtail catheter was introduced. An ultrasound image was saved for documentation purposes. Therapeutic RIGHT thoracentesis was performed. Additional ultrasound scanning of the abdomen demonstrated a large amount of ascites. Under direct  ultrasound guidance, a 7 Fr pigtail catheter was introduced. An ultrasound image was saved for documentation purposes. Therapeutic paracentesis was performed. A preliminary ultrasound of the RIGHT groin was performed and demonstrates a patent greater saphenous vein. A permanent ultrasound image was recorded. Using a combination of fluoroscopy and ultrasound, an access site was determined. A small dermatotomy was made at the planned puncture site. Using ultrasound guidance, access into the RIGHT greater saphenous vein was obtained with visualization of needle entry into the vessel using a standard micropuncture technique. A wire was advanced into the IVC insert all fascial dilation performed. An 8 Fr, 10 cm vascular sheath was placed into the external iliac vein. Through this access site, an 8 Fr AcuNav ICE catheter was advanced with ease under fluoroscopic guidance to the level of the intrahepatic inferior vena cava. A preliminary ultrasound of the RIGHT neck was performed and demonstrates a patent internal jugular vein. A permanent ultrasound image was recorded. Using a combination of fluoroscopy and ultrasound, an access site was determined. A small dermatotomy was made at the planned puncture site. Using ultrasound guidance, access into the RIGHT internal jugular vein was obtained with visualization of needle entry into the vessel using a standard micropuncture technique. A wire was advanced into the IVC and serial fascial dilation performed. A 10 Fr Argon sheath was placed into the internal jugular vein and advanced to the IVC. The jugular sheath was retracted into the RIGHT atrium and manometry was performed. A 5 Fr angled tip catheter was then directed into the RIGHT  hepatic vein. Hepatic venogram was performed. These images demonstrated patent hepatic vein with no stenosis. The catheter was advanced to a wedge portion of the a patent vein over which the sheath was advanced into the distal RIGHT hepatic vein.  Using ICE ultrasound visualization the catheter as RIGHT hepatic vein as well as the portal anatomy was defined. A planned exit site from the hepatic vein and puncture site from the portal vein was placed into a single sonographic plane. Under direct ultrasound visualization, the Argon Scorpion X needle was advanced into the central RIGHT portal vein. Hand injection of contrast confirmed position within the portal system. A Glidewire was then advanced into the superior mesenteric vein. The tract was pre dilated with a 6 mm mustang balloon. A 5 Fr marking pigtail catheter was then advanced over the wire into the main portal vein and wire removed. Portal venogram was performed which demonstrated a patent portal vein. Portosystemic collaterals esophageal varices were seen arising from the main portal vein. Portal manometry was then performed. The tract was then dilated to 8 mm with an 8 mm x 4 cm Athletis balloon. A 8-10 mm by 8 + 2 cm of GORE Viatorr TIPS stent was placed. After placement of the shunt, RIGHT atrial and portal pressures were repeated. Completion portal venogram demonstrates a patent TIPS endograft without significant residual filling of the varices. The RIGHT thoracentesis drain, paracentesis drain, catheters and sheath were removed and manual compression was applied to the RIGHT internal jugular and greater saphenous venous access sites until hemostasis was achieved. The patient was transferred to the PACU in stable condition. Pre-TIPS Mean Pressures (mmHg): Right atrium: 10 Portal vein: 18 Portosystemic gradient: 8 Post-TIPS Mean Pressures (mmHg): Right atrium: 10 Portal vein: 14 Portosystemic gradient: 4 FINDINGS: 1.  Access via the RIGHT jugular and greater saphenous veins. 2. Successful ICE-guided TIPS, via the RIGHT hepatic vein to the RIGHT portal vein, with placement of a 8+2 cm GORE Viatorr stent. 3. Reduction of portosystemic gradient from 8 to 4 mmHg 4. Therapeutic RIGHT thoracentesis with  2.0 L of serous pleural removed. 5.  Therapeutic paracentesis with 3.9 L of serous ascites removed. IMPRESSION: 1. Successful creation of Transjugular Intrahepatic Portosystemic Shunt (TIPS), with a post-TIPS portosystemic pressure gradient of 4 mmHg. 2. Successful therapeutic RIGHT thoracentesis with drainage of 1.75 L of pleural fluid. 3. Successful therapeutic paracentesis with drainage of 3.9 L of ascites. PLAN: The patient is enrolled in the [facility] portal hypertension follow-up clinic, and will be closely followed per the protocol. Roanna Banning, MD [facility] Radiology Electronically Signed   By: Roanna Banning M.D.   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 MELD 16 (08/09/2023).  Child Pugh class B, 9 points. EXAM: Procedures: 1. TRANSJUGULAR INTRAHEPATIC PORTOSYSTEMIC SHUNT (TIPS) 2. INTRACARDIAC ECHOCARDIOGRAPHY (ICE) 3. THERAPEUTIC RIGHT THORACENTESIS 4. THERAPEUTIC PARACENTESIS Performing Physician: Roanna Banning, MD Assistant(s): Acquanetta Belling, MD A qualified trainee/resident or advanced practice provider (APP) was not immediately available to assist with this case. MEDICATIONS: As antibiotic prophylaxis, Rocephin 2 gm IV was ordered pre-procedure and administered intravenously within one hour of incision. ANESTHESIA/SEDATION: Sedation by the Anesthesia Team was performed. Please see anesthesiology log for details. CONTRAST:  75 mL Omnipaque 300, intravenous FLUOROSCOPY TIME:  Fluoroscopic dose; 80 mGy COMPLICATIONS: None immediate. PROCEDURE: Informed written consent was  obtained from the patient and/or patient's representative after a thorough discussion of the procedural risks, benefits and alternatives. All questions were addressed. Maximal Sterile Barrier Technique  was utilized including caps, mask, sterile gowns, sterile gloves, sterile drape, hand hygiene and skin antiseptic. A timeout was performed prior to the initiation of the procedure. Initial ultrasound scanning demonstrates a large amount of pleural effusion within the RIGHT chest. Under direct ultrasound guidance, a 6 Fr pigtail catheter was introduced. An ultrasound image was saved for documentation purposes. Therapeutic RIGHT thoracentesis was performed. Additional ultrasound scanning of the abdomen demonstrated a large amount of ascites. Under direct ultrasound guidance, a 7 Fr pigtail catheter was introduced. An ultrasound image was saved for documentation purposes. Therapeutic paracentesis was performed. A preliminary ultrasound of the RIGHT groin was performed and demonstrates a patent greater saphenous vein. A permanent ultrasound image was recorded. Using a combination of fluoroscopy and ultrasound, an access site was determined. A small dermatotomy was made at the planned puncture site. Using ultrasound guidance, access into the RIGHT greater saphenous vein was obtained with visualization of needle entry into the vessel using a standard micropuncture technique. A wire was advanced into the IVC insert all fascial dilation performed. An 8 Fr, 10 cm vascular sheath was placed into the external iliac vein. Through this access site, an 8 Fr AcuNav ICE catheter was advanced with ease under fluoroscopic guidance to the level of the intrahepatic inferior vena cava. A preliminary ultrasound of the RIGHT neck was performed and demonstrates a patent internal jugular vein. A permanent ultrasound image was recorded. Using a combination of fluoroscopy and ultrasound, an access site was determined. A small dermatotomy was made at the planned puncture site. Using ultrasound guidance, access into the RIGHT internal jugular vein was obtained with visualization of needle entry into the vessel using a standard micropuncture  technique. A wire was advanced into the IVC and serial fascial dilation performed. A 10 Fr Argon sheath was placed into the internal jugular vein and advanced to the IVC. The jugular sheath was retracted into the RIGHT atrium and manometry was performed. A 5 Fr angled tip catheter was then directed into the RIGHT hepatic vein. Hepatic venogram was performed. These images demonstrated patent hepatic vein with no stenosis. The catheter was advanced to a wedge portion of the a patent vein over which the sheath was advanced into the distal RIGHT hepatic vein. Using ICE ultrasound visualization the catheter as RIGHT hepatic vein as well as the portal anatomy was defined. A planned exit site from the hepatic vein and puncture site from the portal vein was placed into a single sonographic plane. Under direct ultrasound visualization, the Argon Scorpion X needle was advanced into the central RIGHT portal vein. Hand injection of contrast confirmed position within the portal system. A Glidewire was then advanced into the superior mesenteric vein. The tract was pre dilated with a 6 mm mustang balloon. A 5 Fr marking pigtail catheter was then advanced over the wire into the main portal vein and wire removed. Portal venogram was performed which demonstrated a patent portal vein. Portosystemic collaterals esophageal varices were seen arising from the main portal vein. Portal manometry was then performed. The tract was then dilated to 8 mm with an 8 mm x 4 cm Athletis balloon. A 8-10 mm by 8 + 2 cm of GORE Viatorr TIPS stent was placed. After placement of the shunt, RIGHT atrial and portal pressures were repeated. Completion portal venogram demonstrates a patent TIPS endograft without  significant residual filling of the varices. The RIGHT thoracentesis drain, paracentesis drain, catheters and sheath were removed and manual compression was applied to the RIGHT internal jugular and greater saphenous venous access sites until  hemostasis was achieved. The patient was transferred to the PACU in stable condition. Pre-TIPS Mean Pressures (mmHg): Right atrium: 10 Portal vein: 18 Portosystemic gradient: 8 Post-TIPS Mean Pressures (mmHg): Right atrium: 10 Portal vein: 14 Portosystemic gradient: 4 FINDINGS: 1.  Access via the RIGHT jugular and greater saphenous veins. 2. Successful ICE-guided TIPS, via the RIGHT hepatic vein to the RIGHT portal vein, with placement of a 8+2 cm GORE Viatorr stent. 3. Reduction of portosystemic gradient from 8 to 4 mmHg 4. Therapeutic RIGHT thoracentesis with 2.0 L of serous pleural removed. 5.  Therapeutic paracentesis with 3.9 L of serous ascites removed. IMPRESSION: 1. Successful creation of Transjugular Intrahepatic Portosystemic Shunt (TIPS), with a post-TIPS portosystemic pressure gradient of 4 mmHg. 2. Successful therapeutic RIGHT thoracentesis with drainage of 1.75 L of pleural fluid. 3. Successful therapeutic paracentesis with drainage of 3.9 L of ascites. PLAN: The patient is enrolled in the [facility] portal hypertension follow-up clinic, and will be closely followed per the protocol. Roanna Banning, MD [facility] Radiology Electronically Signed   By: Roanna Banning M.D.   On: 08/10/2023 17:01   DG CHEST PORT 1 VIEW  Result Date: 08/10/2023 CLINICAL DATA:  Cirrhosis EXAM: PORTABLE CHEST 1 VIEW COMPARISON:  Chest x-ray 08/09/2023 FINDINGS: Right pleural effusion is moderate in size, but has decreased. Central pulmonary vascular congestion and perihilar opacities persist. Minimal patchy mid and lower lung opacities are stable. No pneumothorax. Cardiomediastinal silhouette is within normal limits. No acute fractures are seen. IMPRESSION: 1. Moderate right pleural effusion has decreased. 2. Stable central pulmonary vascular congestion and bilateral airspace disease. Electronically Signed   By: Darliss Cheney M.D.   On: 08/10/2023 15:13   DG Chest Portable 1  View  Result Date: 08/09/2023 CLINICAL DATA:  Postop. EXAM: PORTABLE CHEST 1 VIEW COMPARISON:  Chest x-ray 08/07/2023 FINDINGS: Enlarged cardiopericardial silhouette. Calcified aorta. Decreasing right effusion compared to prior x-ray. There is diffuse heterogeneous opacity involving the right hemithorax, overall increased from previous. There is also increasing parenchymal opacity left mid to lower lung. No pneumothorax. Calcified aorta. Tips shunt in the right upper quadrant. Embolization material left upper quadrant overlapping cardiac leads. IMPRESSION: Improving right effusion with residual. Increasing bilateral lung opacities. No pneumothorax. Recommend follow-up Electronically Signed   By: Karen Kays M.D.   On: 08/09/2023 18:50   CT ANGIO ABD/PELVIS BRTO  Result Date: 08/09/2023 CLINICAL DATA:  Portal hypertension EXAM: CTA ABDOMEN AND PELVIS WITHOUT AND WITH CONTRAST TECHNIQUE: Multidetector CT imaging of the abdomen and pelvis was performed using the standard protocol during bolus administration of intravenous contrast. Multiplanar reconstructed images and MIPs were obtained and reviewed to evaluate the vascular anatomy. RADIATION DOSE REDUCTION: This exam was performed according to the departmental dose-optimization program which includes automated exposure control, adjustment of the mA and/or kV according to patient size and/or use of iterative reconstruction technique. CONTRAST:  OMNIPAQUE IOHEXOL 350 MG/ML SOLN COMPARISON:  CT AP 05/22/2021 and 05/20/2021.  IR BRTO 05/21/2021. FINDINGS: VASCULAR Arterial: Mild burden of aortoiliac atherosclerosis. Widely patent celiac axis, SMA, IMA, renal and pelvic arteries. No aneurysmal dilatation, dissection, vasculitis or significant stenosis. Venous: Prior coil embolization of gastric varices, with dense coil pack at the LEFT quadrant. RIGHT paraesophageal varices. Widely patent IVC, hepatic veins, portal, SMV  and splenic veins. Review of the MIP  images confirms the above findings. NON-VASCULAR Lower chest: Mitral valve calcifications. At least moderate volume RIGHT pleural effusion. LEFT lung is comparatively clear, with trace linear basilar opacities. Hepatobiliary: Shrunken liver with coarse nodular contour. No discrete focal hepatic lesion. Hepatomegaly, with the RIGHT lobe measuring 19 cm. Cholecystectomy. No biliary dilatation. Pancreas: No pancreatic ductal dilatation or surrounding inflammatory changes. Spleen: Splenomegaly, measuring up to 19.5 cm Adrenals/Urinary Tract: Adrenal glands are unremarkable. 1.4 cm RIGHT midpolar simple renal cyst measuring 19 HU. Kidneys are otherwise normal, without renal calculi or hydronephrosis. Bladder is decompressed. Stomach/Bowel: Stomach is within normal limits. Appendix appears normal. Nonobstructed small bowel. Nondilated colon. Mild generalized bowel thickening. No evidence of bowel distention, or inflammatory change. Lymphatic: No enlarged abdominal or pelvic lymph nodes. Reproductive: Hysterectomy. No adnexal mass. Other: No abdominal wall hernia. Moderate volume of abdominopelvic ascites. Musculoskeletal: Diffuse moderate body wall edema. Multilevel degenerative of the spine, including vacuum disc phenomenon at the lower lumbar spine, and chronic-appearing L3 superior endplate compression deformity. No acute osseous findings. IMPRESSION: VASCULAR 1. Patent IVC, hepatic and portal veins. 2. Coil embolization of prior gastric varices without evidence of residual. RIGHT paraesophageal varices. 3.  Aortic Atherosclerosis (ICD10-I70.0). NON-VASCULAR 1. Cirrhosis with hepatosplenomegaly, and stigmata of portal venous hypertension. 2. Moderate-to-large volume RIGHT pleural effusion and abdominopelvic ascites. 3. Mild generalized bowel thickening, favored consistent with portal enteropathy. 4. 1.4 cm RIGHT Bosniak I benign renal cyst. No follow-up imaging is recommended. JACR 2018 Feb; 264-273, Management of the  Incidental Renal Mass on CT, [employer] 2021; 814-848, Bosniak Classification of Cystic Renal Masses, Version 2019 5.  Additional incidental, chronic and senescent findings as above. Roanna Banning, MD [facility] Radiology Electronically Signed   By: Roanna Banning M.D.   On: 08/09/2023 07:44   ECHOCARDIOGRAM COMPLETE  Result Date: 08/08/2023    ECHOCARDIOGRAM REPORT   Patient Name:   Ariel Arnold Date of Exam: 08/08/2023 Medical Rec #:  [id number]       Height:       68.0 in Accession #:    [id number]      Weight:       182.1 lb Date of Birth:  02-04-1956        BSA:          1.964 m Patient Age:    67 years        BP:           115/56 mmHg Patient Gender: F               HR:           76 bpm. Exam Location:  Inpatient Procedure: 2D Echo, Cardiac Doppler and Color Doppler Indications:    Preoperative evaluation  History:        Patient has prior history of Echocardiogram examinations, most                 recent 06/07/2022. CAD; Risk Factors:Hypertension, Diabetes and                 Dyslipidemia. CKD, stage 2.  Sonographer:    Lucendia Herrlich Referring Phys: [id number] JON MUGWERU IMPRESSIONS  1. Left ventricular ejection fraction, by estimation, is 60 to 65%. The left ventricle has normal function. The left ventricle has no regional wall motion abnormalities. There is mild left ventricular hypertrophy of the basal-septal segment. Left ventricular diastolic parameters are indeterminate.  2. Right ventricular systolic  function is normal. The right ventricular size is normal. There is normal pulmonary artery systolic pressure. The estimated right ventricular systolic pressure is 26.0 mmHg.  3. The mitral valve is degenerative. No evidence of mitral valve regurgitation. Mild mitral stenosis. The mean mitral valve gradient is 3.5 mmHg. Moderate mitral annular calcification.  4. The aortic valve is tricuspid. Aortic valve regurgitation is not visualized. Aortic valve  sclerosis is present, with no evidence of aortic valve stenosis. Aortic valve Vmax measures 1.38 m/s.  5. The inferior vena cava is normal in size with greater than 50% respiratory variability, suggesting right atrial pressure of 3 mmHg. FINDINGS  Left Ventricle: Left ventricular ejection fraction, by estimation, is 60 to 65%. The left ventricle has normal function. The left ventricle has no regional wall motion abnormalities. The left ventricular internal cavity size was normal in size. There is  mild left ventricular hypertrophy of the basal-septal segment. Left ventricular diastolic parameters are indeterminate. Normal left ventricular filling pressure. Right Ventricle: The right ventricular size is normal. No increase in right ventricular wall thickness. Right ventricular systolic function is normal. There is normal pulmonary artery systolic pressure. The tricuspid regurgitant velocity is 2.40 m/s, and  with an assumed right atrial pressure of 3 mmHg, the estimated right ventricular systolic pressure is 26.0 mmHg. Left Atrium: Left atrial size was normal in size. Right Atrium: Right atrial size was normal in size. Pericardium: There is no evidence of pericardial effusion. Mitral Valve: The mitral valve is degenerative in appearance. Moderate mitral annular calcification. No evidence of mitral valve regurgitation. Mild mitral valve stenosis. MV peak gradient, 6.7 mmHg. The mean mitral valve gradient is 3.5 mmHg. Tricuspid Valve: The tricuspid valve is normal in structure. Tricuspid valve regurgitation is mild . No evidence of tricuspid stenosis. Aortic Valve: The aortic valve is tricuspid. Aortic valve regurgitation is not visualized. Aortic valve sclerosis is present, with no evidence of aortic valve stenosis. Aortic valve peak gradient measures 7.6 mmHg. Pulmonic Valve: The pulmonic valve was normal in structure. Pulmonic valve regurgitation is trivial. No evidence of pulmonic stenosis. Aorta: The aortic root  is normal in size and structure. Venous: The inferior vena cava is normal in size with greater than 50% respiratory variability, suggesting right atrial pressure of 3 mmHg. IAS/Shunts: No atrial level shunt detected by color flow Doppler.  LEFT VENTRICLE PLAX 2D LVIDd:         4.40 cm   Diastology LVIDs:         2.80 cm   LV e' medial:    6.10 cm/s LV PW:         1.00 cm   LV E/e' medial:  14.9 LV IVS:        1.20 cm   LV e' lateral:   11.60 cm/s LVOT diam:     2.10 cm   LV E/e' lateral: 7.8 LV SV:         53 LV SV Index:   27 LVOT Area:     3.46 cm  RIGHT VENTRICLE             IVC RV S prime:     14.10 cm/s  IVC diam: 1.60 cm TAPSE (M-mode): 2.4 cm LEFT ATRIUM             Index        RIGHT ATRIUM          Index LA diam:        4.30 cm 2.19 cm/m  RA Area:     9.13 cm LA Vol (A2C):   43.3 ml 22.05 ml/m  RA Volume:   15.90 ml 8.10 ml/m LA Vol (A4C):   40.0 ml 20.37 ml/m LA Biplane Vol: 41.4 ml 21.08 ml/m  AORTIC VALVE AV Area (Vmax): 2.04 cm AV Vmax:        138.00 cm/s AV Peak Grad:   7.6 mmHg LVOT Vmax:      81.47 cm/s LVOT Vmean:     52.067 cm/s LVOT VTI:       0.154 m  AORTA Ao Root diam: 3.30 cm Ao Asc diam:  3.30 cm MITRAL VALVE                TRICUSPID VALVE MV Area (PHT): 2.99 cm     TR Peak grad:   23.0 mmHg MV Area VTI:   1.33 cm     TR Vmax:        240.00 cm/s MV Peak grad:  6.7 mmHg MV Mean grad:  3.5 mmHg     SHUNTS MV Vmax:       1.29 m/s     Systemic VTI:  0.15 m MV Vmean:      85.6 cm/s    Systemic Diam: 2.10 cm MV Decel Time: 254 msec MR Peak grad: 15.5 mmHg MR Vmax:      197.00 cm/s MV E velocity: 90.80 cm/s MV A velocity: 109.00 cm/s MV E/A ratio:  0.83 Armanda Magic MD Electronically signed by Armanda Magic MD Signature Date/Time: 08/08/2023/4:51:49 PM    Final    DG CHEST PORT 1 VIEW  Result Date: 08/07/2023 CLINICAL DATA:  Recurrent right pleural effusion.  [id number]. EXAM: PORTABLE CHEST 1 VIEW COMPARISON:  Portable chest 08/05/2023 FINDINGS: 4:58 p.m. A large right pleural effusion  with apical extension and possible partial apical loculation is again noted, slightly increased in size. There is a small portion of the perihilar right upper lung field remaining aerated, less than previously with linear atelectasis and hazy opacity. There is no substantial left pleural effusion. Interstitial and patchy opacities of the left mid and lower lung fields appear similar with perihilar linear atelectatic bands. There is mild cardiomegaly. The aortic arch is heavily calcified. Central vessels appear normal in caliber. IMPRESSION: 1. Large right pleural effusion with apical extension and possible partial apical loculation, slightly increased in size. 2. Small portion of the perihilar right upper lung field remaining aerated, less than previously. 3. Interstitial and patchy opacities of the left mid and lower lung fields appear similar. 4. Mild cardiomegaly. 5. Aortic atherosclerosis. Electronically Signed   By: Almira Bar M.D.   On: 08/07/2023 20:52     Subjective: - no chest pain, shortness of breath, no abdominal pain, nausea or vomiting.   Discharge Exam: BP (!) 101/35 (BP Location: Left Arm)   Pulse 74   Temp 97.9 F (36.6 C) (Oral)   Resp 17   Ht 5\' 8"  (1.727 m)   Wt 69.8 kg   SpO2 99%   BMI 23.40 kg/m   General: Pt is alert, awake, not in acute distress Cardiovascular: RRR, S1/S2 +, no rubs, no gallops Respiratory: CTA bilaterally, no wheezing, no rhonchi Abdominal: Soft, NT, ND, bowel sounds + Extremities: no edema, no cyanosis    The results of significant diagnostics from this hospitalization (including imaging, microbiology, ancillary and laboratory) are listed below for reference.     Microbiology: No results found for this or any previous visit (from the past 240  hour(s)).   Labs: Basic Metabolic Panel: Recent Labs  Lab 08/17/23 1543 08/18/23 0934 08/19/23 1001 08/20/23 0247 08/21/23 0708 08/22/23 0627  NA 136 136 133* 136 134* 135  K 4.0 3.7 4.5  3.9 4.0 4.1  CL 104 98 99 98 99 100  CO2 26 30 29 29 28 29   GLUCOSE 159* 191* 228* 173* 144* 205*  BUN 17 17 19 17 18 21   CREATININE 0.90 1.03* 1.03* 1.04* 1.09* 1.16*  CALCIUM 7.8* 8.3* 7.7* 8.3* 8.0* 8.2*  MG 1.5* 1.5* 1.7 1.6* 1.8  --    Liver Function Tests: Recent Labs  Lab 08/17/23 1543 08/18/23 0934 08/19/23 1001 08/20/23 0247 08/21/23 0708  AST 39 30 29 38 34  ALT 19 17 19 21 19   ALKPHOS 92 71 72 80 78  BILITOT 1.7* 1.9* 1.9* 1.6* 1.6*  PROT 5.4* 5.4* 5.4* 5.3* 5.3*  ALBUMIN 2.6* 2.9* 2.7* 2.5* 2.5*   CBC: Recent Labs  Lab 08/18/23 0934 08/19/23 1001 08/20/23 0247 08/21/23 0708 08/22/23 0627  WBC 2.6* 2.4* 3.0* 2.7* 2.7*  HGB 7.8* 7.9* 7.5* 7.5* 7.6*  HCT 23.5* 24.6* 23.1* 23.6* 23.4*  MCV 97.5 99.6 95.5 96.3 95.9  PLT 60* 68* 71* 74* 72*   CBG: Recent Labs  Lab 08/21/23 1623 08/21/23 2033 08/22/23 0014 08/22/23 0529 08/22/23 0843  GLUCAP 123* 143* 231* 191* 182*   Hgb A1c No results for input(s): "HGBA1C" in the last 72 hours. Lipid Profile No results for input(s): "CHOL", "HDL", "LDLCALC", "TRIG", "CHOLHDL", "LDLDIRECT" in the last 72 hours. Thyroid function studies No results for input(s): "TSH", "T4TOTAL", "T3FREE", "THYROIDAB" in the last 72 hours.  Invalid input(s): "FREET3" Urinalysis    Component Value Date/Time   COLORURINE YELLOW 08/21/2022 0504   APPEARANCEUR CLEAR 08/21/2022 0504   LABSPEC 1.024 08/21/2022 0504   PHURINE 5.0 08/21/2022 0504   GLUCOSEU NEGATIVE 08/21/2022 0504   HGBUR NEGATIVE 08/21/2022 0504   BILIRUBINUR NEGATIVE 08/21/2022 0504   KETONESUR NEGATIVE 08/21/2022 0504   PROTEINUR NEGATIVE 08/21/2022 0504   NITRITE NEGATIVE 08/21/2022 0504   LEUKOCYTESUR TRACE (A) 08/21/2022 0504    FURTHER DISCHARGE INSTRUCTIONS:   Get Medicines reviewed and adjusted: Please take all your medications with you for your next visit with your Primary MD   Laboratory/radiological data: Please request your Primary MD to go over  all hospital tests and procedure/radiological results at the follow up, please ask your Primary MD to get all Hospital records sent to his/her office.   In some cases, they will be blood work, cultures and biopsy results pending at the time of your discharge. Please request that your primary care M.D. goes through all the records of your hospital data and follows up on these results.   Also Note the following: If you experience worsening of your admission symptoms, develop shortness of breath, life threatening emergency, suicidal or homicidal thoughts you must seek medical attention immediately by calling 911 or calling your MD immediately  if symptoms less severe.   You must read complete instructions/literature along with all the possible adverse reactions/side effects for all the Medicines you take and that have been prescribed to you. Take any new Medicines after you have completely understood and accpet all the possible adverse reactions/side effects.    Do not drive when taking Pain medications or sleeping medications (Benzodaizepines)   Do not take more than prescribed Pain, Sleep and Anxiety Medications. It is not advisable to combine anxiety,sleep and pain medications without talking with your primary care  practitioner   Special Instructions: If you have smoked or chewed Tobacco  in the last 2 yrs please stop smoking, stop any regular Alcohol  and or any Recreational drug use.   Wear Seat belts while driving.   Please note: You were cared for by a hospitalist during your hospital stay. Once you are discharged, your primary care physician will handle any further medical issues. Please note that NO REFILLS for any discharge medications will be authorized once you are discharged, as it is imperative that you return to your primary care physician (or establish a relationship with a primary care physician if you do not have one) for your post hospital discharge needs so that they can reassess  your need for medications and monitor your lab values.  Time coordinating discharge: 35 minutes  SIGNED:  Pamella Pert, MD, PhD 08/22/2023, 8:58 AM

## 2023-08-22 NOTE — TOC Progression Note (Addendum)
Transition of Care (TOC) - Progression Note   Called Ariel Arnold/ [phone]) to follow up on referral. Anticipated discharge today .   Spoke to [place] at [facility]. They have approved patient for home hospice. They have ordered hospital bed, over the bed table and oxygen.   NCM spoke to patient and daughter Ariel Arnold. Ariel Arnold has NCM direct number to call when DME gets delivered. She has not been given a time yet.   PT and MD  recommending ambulance transport home. NCM confirmed address with Ariel Arnold and patient. Once Ariel Arnold calls NCM will call [facility] . Both voiced understanding.   Patient's old hospital bed is still in the home. Ariel Arnold will have her husband assist her in taking it down this evening, but they do have  room  for the new hospital bed to be delivered prior to that.   DNR form on chart.   NCM called [facility] and placed patient on will call. Mileage is 43 miles, patient does not have to pay up front. [facility] will file with insurance. Patient aware   43 Daughter has DME and ready for her mother to come home. Called [facility]. Also updated Ariel Arnold with [facility]  Patient Details  Name: Ariel Arnold MRN: [id number] Date of Birth: [birth year]  Transition of Care [facility]) CM/SW Contact  Jerron Niblack, Adria Devon, RN Phone Number: 08/22/2023, 8:12 AM  Clinical Narrative:       Expected Discharge Plan: Home w Hospice Care Barriers to Discharge: Continued Medical Work up  Expected Discharge Plan and Services   Discharge Planning Services: CM Consult Post Acute Care Choice: Home Health Living arrangements for the past 2 months: Apartment                 DME Arranged:  (see note)         HH Arranged: RN, PT           Social Determinants of Health (SDOH) Interventions SDOH Screenings   Food Insecurity: No Food Insecurity (08/07/2023)  Recent Concern: Food Insecurity - Food Insecurity Present (08/03/2023)   Received from [facility]   Housing: Low Risk  (08/07/2023)  Transportation Needs: No Transportation Needs (08/07/2023)  Recent Concern: Transportation Needs - Unmet Transportation Needs (08/03/2023)   Received from [facility]  Utilities: Not At Risk (08/07/2023)  Financial Resource Strain: Medium Risk (08/03/2023)   Received from [facility]  Physical Activity: Inactive (07/16/2020)   Received [facility]  Social Connections: Socially Isolated (08/03/2023)   Received from [facility]  Stress: No Stress Concern Present (06/11/2022)   Received from [facility]  Tobacco Use: Medium Risk (08/09/2023)  Health Literacy: Medium Risk (08/03/2023)   Received from [facility]    Readmission Risk Interventions    11/16/2021    9:27 AM  Readmission Risk Prevention Plan  Home Care Screening Complete  Medication Review (RN CM) Complete

## 2023-08-30 ENCOUNTER — Ambulatory Visit: Payer: Medicare Other | Admitting: Nurse Practitioner

## 2023-09-04 ENCOUNTER — Encounter: Payer: Self-pay | Admitting: Nurse Practitioner

## 2023-09-19 DEATH — deceased

## 2024-02-15 ENCOUNTER — Encounter (INDEPENDENT_AMBULATORY_CARE_PROVIDER_SITE_OTHER): Payer: Self-pay | Admitting: *Deleted
